# Patient Record
Sex: Male | Born: 1950
Health system: Southern US, Community
[De-identification: ages and names within clinical notes are randomized; demographics above are authoritative.]

## PROBLEM LIST (undated history)

## (undated) DIAGNOSIS — N189 Chronic kidney disease, unspecified: Secondary | ICD-10-CM

## (undated) DIAGNOSIS — I5022 Chronic systolic (congestive) heart failure: Secondary | ICD-10-CM

## (undated) DIAGNOSIS — I4819 Other persistent atrial fibrillation: Secondary | ICD-10-CM

## (undated) DIAGNOSIS — I420 Dilated cardiomyopathy: Secondary | ICD-10-CM

## (undated) DIAGNOSIS — I499 Cardiac arrhythmia, unspecified: Secondary | ICD-10-CM

## (undated) HISTORY — DX: Dilated cardiomyopathy: I42.0

## (undated) HISTORY — DX: Chronic systolic (congestive) heart failure: I50.22

## (undated) HISTORY — DX: Other persistent atrial fibrillation: I48.19

---

## 2014-03-22 ENCOUNTER — Emergency Department (HOSPITAL_COMMUNITY): Payer: Self-pay

## 2014-03-22 ENCOUNTER — Encounter (HOSPITAL_COMMUNITY): Payer: Self-pay | Admitting: Anesthesiology

## 2014-03-22 ENCOUNTER — Emergency Department (HOSPITAL_COMMUNITY): Payer: Self-pay | Admitting: Anesthesiology

## 2014-03-22 ENCOUNTER — Encounter (HOSPITAL_COMMUNITY)
Admission: EM | Discharge status: Against Medical Advice | Payer: Self-pay | Source: Home / Self Care | Attending: Family Medicine

## 2014-03-22 ENCOUNTER — Encounter (HOSPITAL_COMMUNITY): Payer: Self-pay | Admitting: Emergency Medicine

## 2014-03-22 ENCOUNTER — Inpatient Hospital Stay (HOSPITAL_COMMUNITY)
Admission: EM | Admit: 2014-03-22 | Discharge: 2014-03-24 | DRG: 854 | Discharge status: Against Medical Advice | Payer: Self-pay | Attending: Family Medicine | Admitting: Family Medicine

## 2014-03-22 DIAGNOSIS — I4891 Unspecified atrial fibrillation: Secondary | ICD-10-CM

## 2014-03-22 DIAGNOSIS — A419 Sepsis, unspecified organism: Principal | ICD-10-CM | POA: Diagnosis present

## 2014-03-22 DIAGNOSIS — R6520 Severe sepsis without septic shock: Secondary | ICD-10-CM

## 2014-03-22 DIAGNOSIS — F411 Generalized anxiety disorder: Secondary | ICD-10-CM | POA: Diagnosis present

## 2014-03-22 DIAGNOSIS — I10 Essential (primary) hypertension: Secondary | ICD-10-CM | POA: Diagnosis present

## 2014-03-22 DIAGNOSIS — L0291 Cutaneous abscess, unspecified: Secondary | ICD-10-CM

## 2014-03-22 DIAGNOSIS — L0231 Cutaneous abscess of buttock: Secondary | ICD-10-CM | POA: Diagnosis present

## 2014-03-22 DIAGNOSIS — A4902 Methicillin resistant Staphylococcus aureus infection, unspecified site: Secondary | ICD-10-CM | POA: Diagnosis present

## 2014-03-22 DIAGNOSIS — N289 Disorder of kidney and ureter, unspecified: Secondary | ICD-10-CM

## 2014-03-22 DIAGNOSIS — L03317 Cellulitis of buttock: Secondary | ICD-10-CM

## 2014-03-22 DIAGNOSIS — N179 Acute kidney failure, unspecified: Secondary | ICD-10-CM | POA: Diagnosis present

## 2014-03-22 DIAGNOSIS — I059 Rheumatic mitral valve disease, unspecified: Secondary | ICD-10-CM | POA: Diagnosis present

## 2014-03-22 HISTORY — PX: INCISION AND DRAINAGE ABSCESS: SHX5864

## 2014-03-22 LAB — CBC WITH DIFFERENTIAL/PLATELET
BASOS ABS: 0 10*3/uL (ref 0.0–0.1)
Basophils Relative: 0 % (ref 0–1)
Eosinophils Absolute: 0 10*3/uL (ref 0.0–0.7)
Eosinophils Relative: 0 % (ref 0–5)
HEMATOCRIT: 48.3 % (ref 39.0–52.0)
Hemoglobin: 16.2 g/dL (ref 13.0–17.0)
LYMPHS ABS: 1.3 10*3/uL (ref 0.7–4.0)
Lymphocytes Relative: 5 % — ABNORMAL LOW (ref 12–46)
MCH: 27.9 pg (ref 26.0–34.0)
MCHC: 33.5 g/dL (ref 30.0–36.0)
MCV: 83.1 fL (ref 78.0–100.0)
Monocytes Absolute: 1.3 10*3/uL — ABNORMAL HIGH (ref 0.1–1.0)
Monocytes Relative: 5 % (ref 3–12)
NEUTROS ABS: 23.3 10*3/uL — AB (ref 1.7–7.7)
Neutrophils Relative %: 90 % — ABNORMAL HIGH (ref 43–77)
Platelets: 362 10*3/uL (ref 150–400)
RBC: 5.81 MIL/uL (ref 4.22–5.81)
RDW: 14.9 % (ref 11.5–15.5)
WBC: 25.9 10*3/uL — ABNORMAL HIGH (ref 4.0–10.5)

## 2014-03-22 LAB — BASIC METABOLIC PANEL
BUN: 25 mg/dL — AB (ref 6–23)
CHLORIDE: 96 meq/L (ref 96–112)
CO2: 24 mEq/L (ref 19–32)
CREATININE: 2.13 mg/dL — AB (ref 0.50–1.35)
Calcium: 8.8 mg/dL (ref 8.4–10.5)
GFR calc Af Amer: 37 mL/min — ABNORMAL LOW (ref >90)
GFR calc non Af Amer: 32 mL/min — ABNORMAL LOW (ref >90)
Glucose, Bld: 152 mg/dL — ABNORMAL HIGH (ref 70–99)
Potassium: 4.2 mEq/L (ref 3.7–5.3)
Sodium: 135 mEq/L — ABNORMAL LOW (ref 137–147)

## 2014-03-22 LAB — TSH: TSH: 3.56 u[IU]/mL (ref 0.350–4.500)

## 2014-03-22 LAB — MRSA PCR SCREENING: MRSA by PCR: NEGATIVE

## 2014-03-22 SURGERY — INCISION AND DRAINAGE, ABSCESS
Anesthesia: General | Laterality: Left

## 2014-03-22 MED ORDER — DILTIAZEM HCL 100 MG IV SOLR
5.0000 mg/h | INTRAVENOUS | Status: DC
  Administered 2014-03-22: 15 mg/h via INTRAVENOUS
  Administered 2014-03-23: 10 mg/h via INTRAVENOUS
  Filled 2014-03-22 (×3): qty 100

## 2014-03-22 MED ORDER — MIDAZOLAM HCL 2 MG/2ML IJ SOLN
INTRAMUSCULAR | Status: AC
  Filled 2014-03-22: qty 2

## 2014-03-22 MED ORDER — HEPARIN SODIUM (PORCINE) 5000 UNIT/ML IJ SOLN
5000.0000 [IU] | Freq: Three times a day (TID) | INTRAMUSCULAR | Status: DC
  Administered 2014-03-22 – 2014-03-24 (×5): 5000 [IU] via SUBCUTANEOUS
  Filled 2014-03-22 (×8): qty 1

## 2014-03-22 MED ORDER — OXYCODONE HCL 5 MG PO TABS: 5.0000 mg | ORAL_TABLET | Freq: Once | ORAL | Status: DC | PRN

## 2014-03-22 MED ORDER — ACETAMINOPHEN 500 MG PO TABS
1000.0000 mg | ORAL_TABLET | Freq: Once | ORAL | Status: AC
Start: 2014-03-22 — End: 2014-03-22
  Administered 2014-03-22: 1000 mg via ORAL
  Filled 2014-03-22: qty 2

## 2014-03-22 MED ORDER — ACETAMINOPHEN 325 MG PO TABS
650.0000 mg | ORAL_TABLET | Freq: Four times a day (QID) | ORAL | Status: DC | PRN
  Administered 2014-03-23: 650 mg via ORAL
  Filled 2014-03-22: qty 2

## 2014-03-22 MED ORDER — HYDROMORPHONE HCL PF 1 MG/ML IJ SOLN
1.0000 mg | INTRAMUSCULAR | Status: DC | PRN
  Administered 2014-03-23: 1 mg via INTRAVENOUS
  Filled 2014-03-22: qty 1

## 2014-03-22 MED ORDER — MORPHINE SULFATE 4 MG/ML IJ SOLN
4.0000 mg | Freq: Once | INTRAMUSCULAR | Status: AC
  Administered 2014-03-22: 4 mg via INTRAVENOUS
  Filled 2014-03-22: qty 1

## 2014-03-22 MED ORDER — SODIUM CHLORIDE 0.9 % IV BOLUS (SEPSIS)
1000.0000 mL | Freq: Once | INTRAVENOUS | Status: AC
  Administered 2014-03-22: 1000 mL via INTRAVENOUS

## 2014-03-22 MED ORDER — HYDROMORPHONE HCL PF 1 MG/ML IJ SOLN: 0.2500 mg | INTRAMUSCULAR | Status: DC | PRN

## 2014-03-22 MED ORDER — FENTANYL CITRATE 0.05 MG/ML IJ SOLN
INTRAMUSCULAR | Status: DC | PRN
  Administered 2014-03-22: 150 ug via INTRAVENOUS
  Administered 2014-03-22: 100 ug via INTRAVENOUS

## 2014-03-22 MED ORDER — LIDOCAINE HCL (CARDIAC) 20 MG/ML IV SOLN
INTRAVENOUS | Status: AC
  Filled 2014-03-22: qty 5

## 2014-03-22 MED ORDER — ONDANSETRON HCL 4 MG/2ML IJ SOLN: 4.0000 mg | Freq: Four times a day (QID) | INTRAMUSCULAR | Status: DC | PRN

## 2014-03-22 MED ORDER — FENTANYL CITRATE 0.05 MG/ML IJ SOLN
INTRAMUSCULAR | Status: AC
  Filled 2014-03-22: qty 5

## 2014-03-22 MED ORDER — PROPOFOL 10 MG/ML IV BOLUS
INTRAVENOUS | Status: DC | PRN
  Administered 2014-03-22: 200 mg via INTRAVENOUS

## 2014-03-22 MED ORDER — ACETAMINOPHEN 650 MG RE SUPP: 650.0000 mg | Freq: Four times a day (QID) | RECTAL | Status: DC | PRN

## 2014-03-22 MED ORDER — LACTATED RINGERS IV SOLN
INTRAVENOUS | Status: DC | PRN
  Administered 2014-03-22 (×2): via INTRAVENOUS

## 2014-03-22 MED ORDER — DILTIAZEM HCL 100 MG IV SOLR
5.0000 mg/h | INTRAVENOUS | Status: DC
  Administered 2014-03-22: 10 mg/h via INTRAVENOUS
  Filled 2014-03-22: qty 100

## 2014-03-22 MED ORDER — CLINDAMYCIN PHOSPHATE 600 MG/50ML IV SOLN
600.0000 mg | Freq: Once | INTRAVENOUS | Status: AC
  Administered 2014-03-22: 600 mg via INTRAVENOUS
  Filled 2014-03-22: qty 50

## 2014-03-22 MED ORDER — PROPOFOL 10 MG/ML IV BOLUS
INTRAVENOUS | Status: AC
  Filled 2014-03-22: qty 20

## 2014-03-22 MED ORDER — SUCCINYLCHOLINE CHLORIDE 20 MG/ML IJ SOLN
INTRAMUSCULAR | Status: AC
  Filled 2014-03-22: qty 1

## 2014-03-22 MED ORDER — CLINDAMYCIN PHOSPHATE 600 MG/50ML IV SOLN
600.0000 mg | Freq: Three times a day (TID) | INTRAVENOUS | Status: DC
  Administered 2014-03-22 – 2014-03-24 (×5): 600 mg via INTRAVENOUS
  Filled 2014-03-22 (×7): qty 50

## 2014-03-22 MED ORDER — DILTIAZEM LOAD VIA INFUSION
10.0000 mg | Freq: Once | INTRAVENOUS | Status: AC
  Administered 2014-03-22: 10 mg via INTRAVENOUS
  Filled 2014-03-22: qty 10

## 2014-03-22 MED ORDER — HEPARIN SODIUM (PORCINE) 5000 UNIT/ML IJ SOLN
5000.0000 [IU] | Freq: Three times a day (TID) | INTRAMUSCULAR | Status: DC
  Filled 2014-03-22: qty 1

## 2014-03-22 MED ORDER — OXYCODONE HCL 5 MG/5ML PO SOLN
5.0000 mg | Freq: Once | ORAL | Status: DC | PRN
Start: 2014-03-22 — End: 2014-03-22

## 2014-03-22 MED ORDER — ONDANSETRON HCL 4 MG/2ML IJ SOLN: 4.0000 mg | Freq: Once | INTRAMUSCULAR | Status: DC | PRN

## 2014-03-22 MED ORDER — DOCUSATE SODIUM 100 MG PO CAPS
100.0000 mg | ORAL_CAPSULE | Freq: Two times a day (BID) | ORAL | Status: DC
  Administered 2014-03-23 (×2): 100 mg via ORAL
  Filled 2014-03-22 (×4): qty 1

## 2014-03-22 MED ORDER — LIDOCAINE HCL (CARDIAC) 20 MG/ML IV SOLN
INTRAVENOUS | Status: DC | PRN
  Administered 2014-03-22: 100 mg via INTRAVENOUS

## 2014-03-22 MED ORDER — ONDANSETRON HCL 4 MG PO TABS: 4.0000 mg | ORAL_TABLET | Freq: Four times a day (QID) | ORAL | Status: DC | PRN

## 2014-03-22 MED ORDER — SUCCINYLCHOLINE CHLORIDE 20 MG/ML IJ SOLN
INTRAMUSCULAR | Status: DC | PRN
Start: 2014-03-22 — End: 2014-03-22
  Administered 2014-03-22: 140 mg via INTRAVENOUS

## 2014-03-22 MED ORDER — MIDAZOLAM HCL 2 MG/2ML IJ SOLN
INTRAMUSCULAR | Status: DC | PRN
  Administered 2014-03-22: 2 mg via INTRAVENOUS

## 2014-03-22 MED ORDER — SODIUM CHLORIDE 0.9 % IJ SOLN
3.0000 mL | Freq: Two times a day (BID) | INTRAMUSCULAR | Status: DC
  Administered 2014-03-22 – 2014-03-23 (×2): 3 mL via INTRAVENOUS

## 2014-03-22 SURGICAL SUPPLY — 27 items
BANDAGE GAUZE ELAST BULKY 4 IN (GAUZE/BANDAGES/DRESSINGS) ×2 IMPLANT
CANISTER SUCTION 2500CC (MISCELLANEOUS) ×2 IMPLANT
COVER SURGICAL LIGHT HANDLE (MISCELLANEOUS) ×2 IMPLANT
DRAPE LAPAROSCOPIC ABDOMINAL (DRAPES) ×2 IMPLANT
DRAPE UTILITY 15X26 W/TAPE STR (DRAPE) ×4 IMPLANT
DRSG PAD ABDOMINAL 8X10 ST (GAUZE/BANDAGES/DRESSINGS) ×2 IMPLANT
ELECT CAUTERY BLADE 6.4 (BLADE) ×2 IMPLANT
ELECT REM PT RETURN 9FT ADLT (ELECTROSURGICAL) ×2
ELECTRODE REM PT RTRN 9FT ADLT (ELECTROSURGICAL) ×1 IMPLANT
GLOVE BIO SURGEON STRL SZ8 (GLOVE) ×2 IMPLANT
GLOVE BIOGEL PI IND STRL 8 (GLOVE) ×1 IMPLANT
GLOVE BIOGEL PI INDICATOR 8 (GLOVE) ×1
GOWN STRL REUS W/ TWL LRG LVL3 (GOWN DISPOSABLE) ×1 IMPLANT
GOWN STRL REUS W/ TWL XL LVL3 (GOWN DISPOSABLE) ×1 IMPLANT
GOWN STRL REUS W/TWL LRG LVL3 (GOWN DISPOSABLE) ×1
GOWN STRL REUS W/TWL XL LVL3 (GOWN DISPOSABLE) ×1
KIT BASIN OR (CUSTOM PROCEDURE TRAY) ×2 IMPLANT
KIT ROOM TURNOVER OR (KITS) ×2 IMPLANT
NS IRRIG 1000ML POUR BTL (IV SOLUTION) ×2 IMPLANT
PACK GENERAL/GYN (CUSTOM PROCEDURE TRAY) ×2 IMPLANT
PAD ABD 8X10 STRL (GAUZE/BANDAGES/DRESSINGS) ×2 IMPLANT
PAD ARMBOARD 7.5X6 YLW CONV (MISCELLANEOUS) ×2 IMPLANT
SPONGE GAUZE 4X4 12PLY (GAUZE/BANDAGES/DRESSINGS) ×2 IMPLANT
SWAB COLLECTION DEVICE MRSA (MISCELLANEOUS) IMPLANT
TOWEL OR 17X24 6PK STRL BLUE (TOWEL DISPOSABLE) ×2 IMPLANT
TOWEL OR 17X26 10 PK STRL BLUE (TOWEL DISPOSABLE) ×2 IMPLANT
TUBE ANAEROBIC SPECIMEN COL (MISCELLANEOUS) IMPLANT

## 2014-03-22 NOTE — Anesthesia Postprocedure Evaluation (Signed)
  Anesthesia Post-op Note  Patient: Jeffery Christian  Procedure(s) Performed: Procedure(s): INCISION AND DRAINAGE ABSCESS LEFT BUTTOCK ABSCESS (Left)  Patient Location: PACU  Anesthesia Type:General  Level of Consciousness: awake, alert  and oriented  Airway and Oxygen Therapy: Patient Spontanous Breathing and Patient connected to face mask oxygen  Post-op Pain: mild  Post-op Assessment: Post-op Vital signs reviewed  Post-op Vital Signs: Reviewed  Last Vitals:  Filed Vitals:   03/22/14 1818  BP: 108/66  Pulse: 127  Temp: 37.9 C  Resp: 27    Complications: No apparent anesthesia complications

## 2014-03-22 NOTE — ED Notes (Signed)
Pt presents to department for evaluation of abscess to buttock region. Ongoing x1 week. Pt states red/yellow colored drainage and severe pain. 9/10 pain at the time. Pt is alert and oriented x4. NAD.

## 2014-03-22 NOTE — Anesthesia Preprocedure Evaluation (Addendum)
Anesthesia Evaluation  Patient identified by MRN, date of birth, ID band Patient awake    Reviewed: Allergy & Precautions, H&P , NPO status , Patient's Chart, lab work & pertinent test results  Airway Mallampati: I TM Distance: >3 FB Neck ROM: Full    Dental  (+) Teeth Intact, Dental Advisory Given   Pulmonary  breath sounds clear to auscultation        Cardiovascular + dysrhythmias (A Fib with rapid ventricular response) Atrial Fibrillation Rhythm:Regular Rate:Normal     Neuro/Psych    GI/Hepatic   Endo/Other    Renal/GU      Musculoskeletal   Abdominal   Peds  Hematology   Anesthesia Other Findings   Reproductive/Obstetrics                           Anesthesia Physical Anesthesia Plan  ASA: III and emergent  Anesthesia Plan: General   Post-op Pain Management:    Induction: Intravenous  Airway Management Planned: Oral ETT  Additional Equipment:   Intra-op Plan:   Post-operative Plan: Extubation in OR  Informed Consent: I have reviewed the patients History and Physical, chart, labs and discussed the procedure including the risks, benefits and alternatives for the proposed anesthesia with the patient or authorized representative who has indicated his/her understanding and acceptance.   Dental advisory given  Plan Discussed with: CRNA, Anesthesiologist and Surgeon  Anesthesia Plan Comments:         Anesthesia Quick Evaluation

## 2014-03-22 NOTE — ED Provider Notes (Signed)
This chart was scribed for Marlon Pel, PA-C, working with Merrie Roof, MD, by Ardelia Mems ED Scribe. This patient was seen in room TR11C/TR11C and the patient's care was started at 1:00 PM.  MSE HPI Comments: Jeffery Christian is a 63 y.o. male who presents to the Emergency Department complaining of an area of gradually worsening swelling to his buttocks for the past week. Small abscess to the area that started 1 month ago. He reports associated gradually worsening pain and redness as well. He has also noticed red and yellow drainage from the area.   PE; The infection does not involve the testicles, perineum or rectum. The abscess measures 9.5 cm x 4 cm, with a surrounding cellulitis field of 27 cm x 13 cm  Cbc, bmp, CT pelvis w IV contrast,  Saline, Clindamycin and Morphine ordered.  Patient moved to the acute side,  Will most likely need a surgeon for I&D.     I personally performed the services described in this documentation, which was scribed in my presence. The recorded information has been reviewed and is accurate.   Dorthula Matas, PA-C 03/22/14 1312  Dorthula Matas, PA-C 03/22/14 1313

## 2014-03-22 NOTE — ED Notes (Signed)
Dr. Loretha Stapler aware of pts HR and pain

## 2014-03-22 NOTE — Op Note (Signed)
03/22/2014  5:53 PM  PATIENT:  Jeffery Christian  63 y.o. male  PRE-OPERATIVE DIAGNOSIS:  left buttock abscess  POST-OPERATIVE DIAGNOSIS:  left buttock abscess With necrotizing soft tissue infection  PROCEDURE:  Procedure(s): INCISION AND DRAINAGE ABSCESS LEFT BUTTOCK ABSCESS, DEBRIDEMENT OF NECROTIZING SOFT TISSUE INFECTION  SURGEON:  Surgeon(s): [name], MD  ASSISTANTS: none   ANESTHESIA:   general  EBL:     BLOOD ADMINISTERED:none  DRAINS: none   SPECIMEN:  Excision  DISPOSITION OF SPECIMEN:  PATHOLOGY  COUNTS:  YES  DICTATION: .Dragon Dictation 1.  Progress note or procedure note with a detailed description of the procedure.  2.  Tool used for debridement (curette, scapel, etc.)  Cautery  3.  Frequency of surgical debridement.   First time  4.  Measurement of total devitalized tissue (wound surface) before and after surgical debridement.  6 x 4 cm preoperatively, 8.5 x 6 cm, 2.5 cm deep postoperatively  5.  Area and depth of devitalized tissue removed from wound.  As above  6.  Blood loss and description of tissue removed.  25 cc blood loss, necrotic purulent tissue debrided  7.  Evidence of the progress of the wound's response to treatment.  Will follow and update  8.  Was there any viable tissue removed (measurements): Minimal rim of viable tissue also debrided  Patient presented with three-day history of increasing pain and drainage from his left buttock. He hss a large buttock abscess with necrotic tissue and is brought emergently to the operating room for incision, drainage, and debridement. He received intravenous antibiotics in the emergency room. Informed consent was obtained. He was brought to the operating room and general endotracheal anesthesia was administered by the anesthesia staff. His placed in prone position. Left buttock and right buttock areas were prepped and draped in sterile fashion.We did time out procedure. Cautery was used to excise  Visible necrotic tissue on his medial left buttock. This area was over 8 cm in size. Large pockets of pus were entered in the subcutaneous fat. This was cultured. Further debridement continued until there is no devitalized tissue and no further pus pockets. Wound was copiously irrigated hemostasis was obtained. Wound was packed with saline-soaked Kerlix and covered with sterile dressing. All counts were correct. Patient tolerated procedure well without apparent complication was taken recovery in guarded condition. Of note, atrial fibrillation with rapid ventricular response present preoperatively had improved rate control at the end of the procedure. PATIENT DISPOSITION:  PACU - guarded condition.   Delay start of Pharmacological VTE agent (>24hrs) due to surgical blood loss or risk of bleeding:  no  Violeta Gelinas, MD, MPH, FACS Pager: [phone]  6/21/20155:53 PM

## 2014-03-22 NOTE — Consult Note (Signed)
Reason for Consult:L buttock abscess Referring Physician: Eulis Foster, PAC  Jeffery Christian is an 63 y.o. male.  HPI: Patient presented to the emergency department with a three-day history of left medial buttock pain. He initially developed a small pustule there. He squeezed some pus out but it continued to enlarge. He treated again himself a day or so ago. His pain progressed. He sat in his hot tub today which did not make her feel better. It began to drain bloody purulent fluid and he came to the emergency department. He has had abscesses in the same location but not as severe in the past. During his emergency department evaluation he was noted to be in a fib with rapid ventricular response. He was also noted to have acute kidney injury. CT without contrast was obtained demonstrating a left buttock abscess.  History reviewed. No pertinent past medical history.  History reviewed. No pertinent past surgical history.  No family history on file.  Social History:  reports that he has never smoked. He does not have any smokeless tobacco history on file. He reports that he does not drink alcohol or use illicit drugs.  Allergies: No Known Allergies  Medications: Scheduled: Continuous: PRN:    Results for orders placed during the hospital encounter of 03/22/14 (from the past 48 hour(s))  CBC WITH DIFFERENTIAL     Status: Abnormal   Collection Time    03/22/14 12:47 PM      Result Value Ref Range   WBC 25.9 (*) 4.0 - 10.5 K/uL   Comment: WHITE COUNT CONFIRMED ON SMEAR   RBC 5.81  4.22 - 5.81 MIL/uL   Hemoglobin 16.2  13.0 - 17.0 g/dL   HCT 48.3  39.0 - 52.0 %   MCV 83.1  78.0 - 100.0 fL   MCH 27.9  26.0 - 34.0 pg   MCHC 33.5  30.0 - 36.0 g/dL   RDW 14.9  11.5 - 15.5 %   Platelets 362  150 - 400 K/uL   Neutrophils Relative % 90 (*) 43 - 77 %   Lymphocytes Relative 5 (*) 12 - 46 %   Monocytes Relative 5  3 - 12 %   Eosinophils Relative 0  0 - 5 %   Basophils Relative 0  0 - 1 %   Neutro Abs  23.3 (*) 1.7 - 7.7 K/uL   Lymphs Abs 1.3  0.7 - 4.0 K/uL   Monocytes Absolute 1.3 (*) 0.1 - 1.0 K/uL   Eosinophils Absolute 0.0  0.0 - 0.7 K/uL   Basophils Absolute 0.0  0.0 - 0.1 K/uL   WBC Morphology TOXIC GRANULATION     Comment: INCREASED BANDS (>20% BANDS)  BASIC METABOLIC PANEL     Status: Abnormal   Collection Time    03/22/14 12:47 PM      Result Value Ref Range   Sodium 135 (*) 137 - 147 mEq/L   Potassium 4.2  3.7 - 5.3 mEq/L   Chloride 96  96 - 112 mEq/L   CO2 24  19 - 32 mEq/L   Glucose, Bld 152 (*) 70 - 99 mg/dL   BUN 25 (*) 6 - 23 mg/dL   Creatinine, Ser 2.13 (*) 0.50 - 1.35 mg/dL   Calcium 8.8  8.4 - 10.5 mg/dL   GFR calc non Af Amer 32 (*) >90 mL/min   GFR calc Af Amer 37 (*) >90 mL/min   Comment: (NOTE)     The eGFR has been calculated using the CKD  EPI equation.     This calculation has not been validated in all clinical situations.     eGFR's persistently <90 mL/min signify possible Chronic Kidney     Disease.    Ct Pelvis Wo Contrast  03/22/2014   CLINICAL DATA:  abscess to buttocks ulceration, ? deep fascial infectio- cannot have IV due to elevated Creat  Go  EXAM: CT PELVIS WITHOUT CONTRAST  TECHNIQUE: Multidetector CT imaging of the pelvis was performed following the standard protocol without intravenous contrast.  COMPARISON:  None.  FINDINGS: Adjacent to the gluteal fall on the left is a 6.8 x 3.4 cm low attenuating focus in the subcutaneous fat. The borders of this finding are irregular with Hounsfield units of 25. There is thickening of the overlying skin and inflammatory change extending into the surrounding gluteal fat. This area extends to the distal portion of the coccyx. Differential considerations a phlegmon versus an early or complex viscus abscess. Osteomyelitis of the distal coccyx cannot be excluded. This finding is not having well defined appearance which be consistent with a drainable abscess.  The gluteal musculature is symmetric. There is no  evidence of infiltration or edema. The remaining pelvic musculature and subcutaneous fat is homogeneous and symmetric without infiltration. No further evidence of free fluid or loculated fluid collections.  The intrapelvic contents are unremarkable. The bowel is negative. A very small amount of inflammatory change in the mid to lower presacral region. The visualized non-opacified vascular structures unremarkable. No lower abdominal wall or inguinal hernia. The prostate is prominent and there is indentation along the base of the bladder from the prostate.  There is no evidence of acute fracture or dislocation within the osseous structures.  IMPRESSION: Focal complex appearing collection adjacent to the gluteal fold region on the left. This area extends to the tip of the coccyx. Differential considerations are a phlegmon. Osteomyelitis within the distal coccyx cannot be excluded. Surgical consultation recommended. These results were called by telephone at the time of interpretation on 03/22/2014 at 3:55 PM to the charge nurse of the emergency department, [place] GROCE, who verbally acknowledged these results.  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   Electronically Signed   By: Salome Holmes M.D.   On: 03/22/2014 15:58    Review of Systems  Constitutional: Negative for fever and chills.  HENT: Negative.   Eyes: Negative.   Respiratory: Negative.   Cardiovascular: Negative for chest pain.       No sensation of palpitations or irregular heartbeat  Gastrointestinal:       Decreased appetite for several days, see history of present illness  Genitourinary: Negative.   Musculoskeletal: Negative.   Skin:       See history of present illness  Neurological: Negative.   Endo/Heme/Allergies: Negative.   Psychiatric/Behavioral: Negative.    Blood pressure 112/55, pulse 137, temperature 101.8 F (38.8 C), temperature source Oral, resp. rate 21, SpO2 96.00%. Physical Exam  Constitutional:  He is oriented to person, place, and time. He appears well-developed and well-nourished.  Mild distress  HENT:  Head: Normocephalic.  Mouth/Throat: Oropharynx is clear and moist. No oropharyngeal exudate.  Eyes: EOM are normal. Pupils are equal, round, and reactive to light. No scleral icterus.  Neck: Normal range of motion. Neck supple. No tracheal deviation present.  Cardiovascular:  Irregularly irregular, heart rate 150  Respiratory: Effort normal and breath sounds normal. No stridor. No respiratory distress. He has no wheezes.  GI: Soft. He exhibits no  distension. There is no tenderness. There is no rebound and no guarding.  Musculoskeletal:       Legs: 6 cm x 2 cm area along the medial left buttock with necrotic skin and purulent drainage, large surrounding cellulitis involving the left buttock, tender  Neurological: He is alert and oriented to person, place, and time. He exhibits normal muscle tone. Coordination normal.  Psychiatric: He has a normal mood and affect.    Assessment/Plan: Large left buttock abscess - will proceed to the OR for incision, drainage, and debridement. Procedure, risks, benefits were discussed in detail with the patient. He agrees. Continue IV antibiotics  Atrial fibrillation with rapid ventricular response, acute kidney injury - will be managed by the family practice service who are admitting.  Atlantis Delong E 03/22/2014, 4:26 PM

## 2014-03-22 NOTE — H&P (Signed)
[facility] Admission History and Physical Service Pager: [phone]  Patient name: Jeffery Christian Medical record number: [id number] Date of birth: August 15, 1951 Age: 63 y.o. Gender: male  Primary Care Provider: No PCP Per Patient Consultants: Surgery Code Status: Full   Chief Complaint: Left Buttock abscess   Assessment and Plan: Jeffery Christian is a 63 y.o. male presenting with left buttock abscess.  CT pelvis concerning for 6.8 x 3.4 cm abscess of the left buttock. PMH is significant for gout. In the to step down unit for close monitoring of sepsis and A. fib with RVR. Dr. Jennette Kettle attending.  Left buttock abscess: White count of 25.9 on admission. CT concerning for 6.8 x 3.4 cm abscess. She received one dose of clindamycin in the ED. Patient is febrile, tachycardic, with a white count and source, meeting sepsis criteria. He is to be admitted to step down unit for close monitoring after surgery. - Surgical consult: Thank you for your recommendations. Patient is to go to surgery today. Wound cultures will be obtained. Antibiotics per recommendations of surgical team. - Blood cultures ordered. - Repeat CBC in the a.m., A1c - Close monitoring of vital signs due to sepsis.   A. fib with RVR: Likely transient d/t sepsis, however uncertain if he has arrhythmia at baseline. Patient does not follow regularly with a physician. - Will treat fever and infection aggressively.  - IV fluids administered. - TSH - Step down unit, telemetry for close monitoring of patient. - Consult cardiology  AKI: Uncertain of pts baseline. Reports no kidney disease. Likey injury from decreased PO intake. No contrast was given for CT.  - Continue to monitor - IVF - avoid nephrotoxic agents   FEN/GI: N.p.o., maintenance IV fluid Prophylaxis: Heparin subcutaneous after surgery.  Disposition: Pending clinical improvement and surgical recommendations. Likely stay at least 2 evenings.  History of  Present Illness: Jeffery Christian is a 63 y.o. male presenting with left buttock abscess. Patient states that he noticed last Monday a pimple/boil on his buttock. At that time he states squeezed it until puslike fluid came out. 2 days later he noticed enlargement to boil and he again squeezed it. Today he states he attempted to get in his hot tub and the boil " exploded ". Patient endorses chills and fever over the last 3 days. Denies nausea, vomit or diarrhea. He reports she's been able to eat or drink okay the last few days. His appetite has been mildly affected. His last by mouth intake was this morning for yogurt and if you are see which, he is drinking fluids while in the emergency room. Patient states he has no past medical history except for gout, which he is not medicated.   Patient states he does not use alcohol or tobacco. He denies illicit drugs. His younger daughter is with him today.  Review Of Systems: Per HPI  Otherwise 12 point review of systems was performed and was unremarkable.  There are no active problems to display for this patient.  Past Medical History: History reviewed. No pertinent past medical history. Past Surgical History: History reviewed. No pertinent past surgical history. Social History: History  Substance Use Topics  . Smoking status: Never Smoker   . Smokeless tobacco: Not on file  . Alcohol Use: No   Additional social history: See above Please also refer to relevant sections of EMR.  Family History: No family history on file. Allergies and Medications: No Known Allergies No current facility-administered medications on file prior to  encounter.   No current outpatient prescriptions on file prior to encounter.    Objective: BP 112/55  Pulse 137  Temp(Src) 101.8 F (38.8 C) (Oral)  Resp 21  SpO2 96% Exam: General: Mildly distressed, in pain obese Caucasian male. Nontoxic in appearance. HEENT: Atraumatic, normocephalic. Bilateral eyes without  injections or icterus. Moist mucous membranes. Cardiovascular: Tachycardic. Irregular irregular. No murmurs, clicks, gallops or rubs appreciated. Respiratory: Clear to auscultation bilaterally. No wheezing, rhonchi or rales. Normal work of breath. Abdomen: Soft. Obese. Nontender nondistended. Bowel sounds present. No masses palpated. Extremities: Warm, well perfused. +2/4 DP/PT. No rashes. No erythema or edema. Calves nontender. Skin: Left medial buttock with large 6 cm x 2 cm area of  Necrotic, skin purulent and bloody drainage surrounded by cellulitis involving almost the entire left buttock. Area is extremely tender. Neuro: Alert. Oriented x3. Perla. EOMI. Cranial nerves II through XII intact.  Labs and Imaging: CBC BMET   Recent Labs Lab 03/22/14 1247  WBC 25.9*  HGB 16.2  HCT 48.3  PLT 362    Recent Labs Lab 03/22/14 1247  NA 135*  K 4.2  CL 96  CO2 24  BUN 25*  CREATININE 2.13*  GLUCOSE 152*  CALCIUM 8.8     Ct Pelvis Wo Contrast  03/22/2014   CLINICAL DATA:  abscess to buttocks ulceration, ? deep fascial infectio- cannot have IV due to elevated Creat  Go  EXAM: CT PELVIS WITHOUT CONTRAST  TECHNIQUE: Multidetector CT imaging of the pelvis was performed following the standard protocol without intravenous contrast.  COMPARISON:  None.  FINDINGS: Adjacent to the gluteal fall on the left is a 6.8 x 3.4 cm low attenuating focus in the subcutaneous fat. The borders of this finding are irregular with Hounsfield units of 25. There is thickening of the overlying skin and inflammatory change extending into the surrounding gluteal fat. This area extends to the distal portion of the coccyx. Differential considerations a phlegmon versus an early or complex viscus abscess. Osteomyelitis of the distal coccyx cannot be excluded. This finding is not having well defined appearance which be consistent with a drainable abscess.  The gluteal musculature is symmetric. There is no evidence of  infiltration or edema. The remaining pelvic musculature and subcutaneous fat is homogeneous and symmetric without infiltration. No further evidence of free fluid or loculated fluid collections.  The intrapelvic contents are unremarkable. The bowel is negative. A very small amount of inflammatory change in the mid to lower presacral region. The visualized non-opacified vascular structures unremarkable. No lower abdominal wall or inguinal hernia. The prostate is prominent and there is indentation along the base of the bladder from the prostate.  There is no evidence of acute fracture or dislocation within the osseous structures.  IMPRESSION: Focal complex appearing collection adjacent to the gluteal fold region on the left. This area extends to the tip of the coccyx. Differential considerations are a phlegmon. Osteomyelitis within the distal coccyx cannot be excluded. Surgical consultation recommended. These results were called by telephone at the time of interpretation on 03/22/2014 at 3:55 PM to the charge nurse of the emergency department, [place] GROCE, who verbally acknowledged these results.  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   Electronically Signed   By: Salome HolmesHector  Cooper M.D.   On: 03/22/2014 15:58     Natalia Leatherwoodenee A Kuneff, DO 03/22/2014, 4:15 PM PGY-2, [facility] FPTS Intern pager: [phone], text pages welcome

## 2014-03-22 NOTE — ED Provider Notes (Signed)
CSN: [id number]     Arrival date & time 03/22/14  1153 History   First MD Initiated Contact with Patient 03/22/14 1237     Chief Complaint  Patient presents with  . Abscess     (Consider location/radiation/quality/duration/timing/severity/associated sxs/prior Treatment) Patient is a 63 y.o. male presenting with abscess.  Abscess Abscess location: buttock. Abscess quality: draining, fluctuance, redness, warmth and weeping   Red streaking: yes   Duration:  1 week Progression:  Worsening Chronicity:  New Context: not diabetes and not immunosuppression   Context comment:  Noticed a small bump a week ago which he squeezed while in the shower, expressing some fluid at that time.  Then has soaked in the hot tub afew times.   Relieved by:  Nothing Worsened by:  Draining/squeezing Associated symptoms: fever     History reviewed. No pertinent past medical history. History reviewed. No pertinent past surgical history. No family history on file. History  Substance Use Topics  . Smoking status: Never Smoker   . Smokeless tobacco: Not on file  . Alcohol Use: No    Review of Systems  Constitutional: Positive for fever.  All other systems reviewed and are negative.     Allergies  Review of patient's allergies indicates no known allergies.  Home Medications   Prior to Admission medications   Not on File   BP 137/85  Pulse 100  Temp(Src) 100.6 F (38.1 C) (Oral)  Resp 22  SpO2 97% Physical Exam  Nursing note and vitals reviewed. Constitutional: He is oriented to person, place, and time. He appears well-developed and well-nourished. No distress.  HENT:  Head: Normocephalic and atraumatic.  Eyes: Conjunctivae are normal. No scleral icterus.  Neck: Neck supple.  Cardiovascular: Normal rate and intact distal pulses.   Pulmonary/Chest: Effort normal. No stridor. No respiratory distress.  Abdominal: Normal appearance. He exhibits no distension. There is no tenderness. There  is no rigidity, no rebound and no guarding.  Genitourinary:  Very large fluctuant mass within left buttock.  Some purulent drainage.  Surrounding erythema.   No crepitus.  No scrotal pain, swelling, masses, or crepitus.    Neurological: He is alert and oriented to person, place, and time.  Skin: Skin is warm and dry. No rash noted.  Psychiatric: He has a normal mood and affect. His behavior is normal.    ED Course  Procedures (including critical care time) Labs Review Labs Reviewed  CBC WITH DIFFERENTIAL - Abnormal; Notable for the following:    WBC 25.9 (*)    Neutrophils Relative % 90 (*)    Lymphocytes Relative 5 (*)    Neutro Abs 23.3 (*)    Monocytes Absolute 1.3 (*)    All other components within normal limits  BASIC METABOLIC PANEL - Abnormal; Notable for the following:    Sodium 135 (*)    Glucose, Bld 152 (*)    BUN 25 (*)    Creatinine, Ser 2.13 (*)    GFR calc non Af Amer 32 (*)    GFR calc Af Amer 37 (*)    All other components within normal limits    Imaging Review Ct Pelvis Wo Contrast  03/22/2014   CLINICAL DATA:  abscess to buttocks ulceration, ? deep fascial infectio- cannot have IV due to elevated Creat  Go  EXAM: CT PELVIS WITHOUT CONTRAST  TECHNIQUE: Multidetector CT imaging of the pelvis was performed following the standard protocol without intravenous contrast.  COMPARISON:  None.  FINDINGS: Adjacent to the gluteal  fall on the left is a 6.8 x 3.4 cm low attenuating focus in the subcutaneous fat. The borders of this finding are irregular with Hounsfield units of 25. There is thickening of the overlying skin and inflammatory change extending into the surrounding gluteal fat. This area extends to the distal portion of the coccyx. Differential considerations a phlegmon versus an early or complex viscus abscess. Osteomyelitis of the distal coccyx cannot be excluded. This finding is not having well defined appearance which be consistent with a drainable abscess.  The  gluteal musculature is symmetric. There is no evidence of infiltration or edema. The remaining pelvic musculature and subcutaneous fat is homogeneous and symmetric without infiltration. No further evidence of free fluid or loculated fluid collections.  The intrapelvic contents are unremarkable. The bowel is negative. A very small amount of inflammatory change in the mid to lower presacral region. The visualized non-opacified vascular structures unremarkable. No lower abdominal wall or inguinal hernia. The prostate is prominent and there is indentation along the base of the bladder from the prostate.  There is no evidence of acute fracture or dislocation within the osseous structures.  IMPRESSION: Focal complex appearing collection adjacent to the gluteal fold region on the left. This area extends to the tip of the coccyx. Differential considerations are a phlegmon. Osteomyelitis within the distal coccyx cannot be excluded. Surgical consultation recommended. These results were called by telephone at the time of interpretation on 03/22/2014 at 3:55 PM to the charge nurse of the emergency department, [place] GROCE, who verbally acknowledged these results.  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   Electronically Signed   By: Salome Holmes M.D.   On: 03/22/2014 15:58  All radiology studies independently viewed by me.      EKG Interpretation   Date/Time:  Sunday March 22 2014 15:10:08 EDT Ventricular Rate:  126 PR Interval:    QRS Duration: 80 QT Interval:  270 QTC Calculation: 391 R Axis:   58 Text Interpretation:  Atrial fibrillation Minimal ST depression Baseline  wander in lead(s) V3 No old tracing to compare Confirmed by [facility]  MD,  TREY ([id number]) on 03/22/2014 3:25:01 PM      MDM   Final diagnoses:  Abscess  Sepsis, due to unspecified organism  Atrial fibrillation with RVR  Renal insufficiency    63 yo male with large abscess to left buttock.  IV Clinda given.  CT shows  fluid collection tracking to coccyx.  Consulted Gen Surgery (Dr. Janee Morn) who will evaluate.  Workup also shows severe leukocytosis and moderate renal dysfunction of unknown chronicity.  EKG shows a fib with tachycardic rate (also unknown to patient).  Will treat initially with antipyretics and fluids given his septic state.  Also consulted family practice who will admit.      Candyce Churn III, MD 03/22/14 [phone]

## 2014-03-22 NOTE — Transfer of Care (Signed)
Immediate Anesthesia Transfer of Care Note  Patient: Jeffery Christian  Procedure(s) Performed: Procedure(s): INCISION AND DRAINAGE ABSCESS LEFT BUTTOCK ABSCESS (Left)  Patient Location: PACU  Anesthesia Type:General  Level of Consciousness: sedated  Airway & Oxygen Therapy: Patient Spontanous Breathing and Patient connected to face mask oxygen  Post-op Assessment: Report given to PACU RN and Post -op Vital signs reviewed and stable  Post vital signs: Reviewed and stable  Complications: No apparent anesthesia complications

## 2014-03-22 NOTE — ED Notes (Signed)
Attempted to contact Jeffery Christian re: Creat level of 2.13 exceeding threshold for IV contrast, left message with Secretary, who was going to ask her to call CT @ [phone]

## 2014-03-22 NOTE — Anesthesia Procedure Notes (Signed)
Procedure Name: Intubation Date/Time: 03/22/2014 5:19 PM Performed by: Alanda Amass A Pre-anesthesia Checklist: Patient identified, Timeout performed, Emergency Drugs available, Suction available and Patient being monitored Patient Re-evaluated:Patient Re-evaluated prior to inductionOxygen Delivery Method: Circle system utilized Preoxygenation: Pre-oxygenation with 100% oxygen Intubation Type: IV induction, Rapid sequence and Cricoid Pressure applied Tube type: Oral Tube size: 8.0 mm Number of attempts: 1 Airway Equipment and Method: Video-laryngoscopy and Stylet Placement Confirmation: ETT inserted through vocal cords under direct vision,  breath sounds checked- equal and bilateral and positive ETCO2 Secured at: 23 cm Tube secured with: Tape Dental Injury: Teeth and Oropharynx as per pre-operative assessment

## 2014-03-23 DIAGNOSIS — I059 Rheumatic mitral valve disease, unspecified: Secondary | ICD-10-CM

## 2014-03-23 DIAGNOSIS — L0390 Cellulitis, unspecified: Secondary | ICD-10-CM

## 2014-03-23 DIAGNOSIS — I4891 Unspecified atrial fibrillation: Secondary | ICD-10-CM

## 2014-03-23 DIAGNOSIS — L0291 Cutaneous abscess, unspecified: Secondary | ICD-10-CM

## 2014-03-23 LAB — HEMOGLOBIN A1C
Hgb A1c MFr Bld: 5.1 % (ref <5.7)
Mean Plasma Glucose: 100 mg/dL (ref <117)

## 2014-03-23 LAB — CBC
HEMATOCRIT: 43.8 % (ref 39.0–52.0)
HEMOGLOBIN: 14.5 g/dL (ref 13.0–17.0)
MCH: 27.7 pg (ref 26.0–34.0)
MCHC: 33.1 g/dL (ref 30.0–36.0)
MCV: 83.6 fL (ref 78.0–100.0)
Platelets: 283 10*3/uL (ref 150–400)
RBC: 5.24 MIL/uL (ref 4.22–5.81)
RDW: 15.2 % (ref 11.5–15.5)
WBC: 19.6 10*3/uL — AB (ref 4.0–10.5)

## 2014-03-23 LAB — BASIC METABOLIC PANEL
BUN: 35 mg/dL — ABNORMAL HIGH (ref 6–23)
CHLORIDE: 96 meq/L (ref 96–112)
CO2: 25 meq/L (ref 19–32)
CREATININE: 2.3 mg/dL — AB (ref 0.50–1.35)
Calcium: 8 mg/dL — ABNORMAL LOW (ref 8.4–10.5)
GFR calc non Af Amer: 29 mL/min — ABNORMAL LOW (ref >90)
GFR, EST AFRICAN AMERICAN: 34 mL/min — AB (ref >90)
Glucose, Bld: 138 mg/dL — ABNORMAL HIGH (ref 70–99)
POTASSIUM: 4.2 meq/L (ref 3.7–5.3)
Sodium: 133 mEq/L — ABNORMAL LOW (ref 137–147)

## 2014-03-23 LAB — RAPID URINE DRUG SCREEN, HOSP PERFORMED
Amphetamines: NOT DETECTED
BARBITURATES: NOT DETECTED
Benzodiazepines: POSITIVE — AB
COCAINE: NOT DETECTED
Opiates: POSITIVE — AB
Tetrahydrocannabinol: NOT DETECTED

## 2014-03-23 MED ORDER — DILTIAZEM HCL 30 MG PO TABS
30.0000 mg | ORAL_TABLET | Freq: Four times a day (QID) | ORAL | Status: DC
  Administered 2014-03-23 – 2014-03-24 (×4): 30 mg via ORAL
  Filled 2014-03-23 (×7): qty 1

## 2014-03-23 MED ORDER — METOPROLOL SUCCINATE ER 50 MG PO TB24
50.0000 mg | ORAL_TABLET | Freq: Every day | ORAL | Status: DC
  Administered 2014-03-23 – 2014-03-24 (×2): 50 mg via ORAL
  Filled 2014-03-23 (×2): qty 1

## 2014-03-23 MED ORDER — ADULT MULTIVITAMIN W/MINERALS CH
1.0000 | ORAL_TABLET | Freq: Every day | ORAL | Status: DC
  Administered 2014-03-23 – 2014-03-24 (×2): 1 via ORAL
  Filled 2014-03-23 (×2): qty 1

## 2014-03-23 MED ORDER — LORAZEPAM 0.5 MG PO TABS: 1.0000 mg | ORAL_TABLET | Freq: Four times a day (QID) | ORAL | Status: DC | PRN

## 2014-03-23 MED ORDER — SODIUM CHLORIDE 0.9 % IV SOLN
INTRAVENOUS | Status: DC
Start: 2014-03-23 — End: 2014-03-24
  Administered 2014-03-23: 13:00:00 via INTRAVENOUS

## 2014-03-23 MED ORDER — LORAZEPAM 2 MG/ML IJ SOLN: 1.0000 mg | Freq: Four times a day (QID) | INTRAMUSCULAR | Status: DC | PRN

## 2014-03-23 MED ORDER — THIAMINE HCL 100 MG/ML IJ SOLN
100.0000 mg | Freq: Every day | INTRAMUSCULAR | Status: DC
  Filled 2014-03-23 (×2): qty 1

## 2014-03-23 MED ORDER — OXYCODONE-ACETAMINOPHEN 5-325 MG PO TABS: 1.0000 | ORAL_TABLET | Freq: Four times a day (QID) | ORAL | Status: DC | PRN

## 2014-03-23 MED ORDER — FOLIC ACID 1 MG PO TABS
1.0000 mg | ORAL_TABLET | Freq: Every day | ORAL | Status: DC
Start: 2014-03-23 — End: 2014-03-24
  Administered 2014-03-23 – 2014-03-24 (×2): 1 mg via ORAL
  Filled 2014-03-23 (×2): qty 1

## 2014-03-23 MED ORDER — VITAMIN B-1 100 MG PO TABS
100.0000 mg | ORAL_TABLET | Freq: Every day | ORAL | Status: DC
  Administered 2014-03-23 – 2014-03-24 (×2): 100 mg via ORAL
  Filled 2014-03-23 (×2): qty 1

## 2014-03-23 NOTE — Progress Notes (Signed)
Patient ID: Jeffery Christian, male   DOB: 10/11/1950, 63 y.o.   MRN: [id number]   Subjective: Afebrile, little discomfort.    Objective:  Vital signs:  Filed Vitals:   03/23/14 0419 03/23/14 0551 03/23/14 0633 03/23/14 0801  BP:  119/99  121/64  Pulse: 87 86  90  Temp:  98.2 F (36.8 C)  100.1 F (37.8 C)  TempSrc:  Oral  Oral  Resp: $Remo'29 25  24  'fWucw$ Height:      Weight:   204 lb 9.4 oz (92.8 kg)   SpO2: 93% 95%  96%    Last BM Date: 03/22/14  Intake/Output   Yesterday:  06/21 0701 - 06/22 0700 In: 3230.5 [P.O.:1200; I.V.:1930.5; IV Piggyback:100] Out: 625 [Urine:600; Blood:25] This shift:    I/O last 3 completed shifts: In: 3230.5 [P.O.:1200; I.V.:1930.5; IV Piggyback:100] Out: 625 [Urine:600; Blood:25] Total I/O In: 480 [P.O.:480] Out: -   Physical Exam: General: Pt awake/alert/oriented x4 in no acute distress Skin: right perirectal wound-clean, surrounding erythema and induration without areas of fluctuance.  Wound repacked.    Problem List:   Active Problems:   Left buttock abscess    Results:   Labs: Results for orders placed during the hospital encounter of 03/22/14 (from the past 48 hour(s))  CBC WITH DIFFERENTIAL     Status: Abnormal   Collection Time    03/22/14 12:47 PM      Result Value Ref Range   WBC 25.9 (*) 4.0 - 10.5 K/uL   Comment: WHITE COUNT CONFIRMED ON SMEAR   RBC 5.81  4.22 - 5.81 MIL/uL   Hemoglobin 16.2  13.0 - 17.0 g/dL   HCT 48.3  39.0 - 52.0 %   MCV 83.1  78.0 - 100.0 fL   MCH 27.9  26.0 - 34.0 pg   MCHC 33.5  30.0 - 36.0 g/dL   RDW 14.9  11.5 - 15.5 %   Platelets 362  150 - 400 K/uL   Neutrophils Relative % 90 (*) 43 - 77 %   Lymphocytes Relative 5 (*) 12 - 46 %   Monocytes Relative 5  3 - 12 %   Eosinophils Relative 0  0 - 5 %   Basophils Relative 0  0 - 1 %   Neutro Abs 23.3 (*) 1.7 - 7.7 K/uL   Lymphs Abs 1.3  0.7 - 4.0 K/uL   Monocytes Absolute 1.3 (*) 0.1 - 1.0 K/uL   Eosinophils Absolute 0.0  0.0 - 0.7 K/uL   Basophils  Absolute 0.0  0.0 - 0.1 K/uL   WBC Morphology TOXIC GRANULATION     Comment: INCREASED BANDS (>20% BANDS)  BASIC METABOLIC PANEL     Status: Abnormal   Collection Time    03/22/14 12:47 PM      Result Value Ref Range   Sodium 135 (*) 137 - 147 mEq/L   Potassium 4.2  3.7 - 5.3 mEq/L   Chloride 96  96 - 112 mEq/L   CO2 24  19 - 32 mEq/L   Glucose, Bld 152 (*) 70 - 99 mg/dL   BUN 25 (*) 6 - 23 mg/dL   Creatinine, Ser 2.13 (*) 0.50 - 1.35 mg/dL   Calcium 8.8  8.4 - 10.5 mg/dL   GFR calc non Af Amer 32 (*) >90 mL/min   GFR calc Af Amer 37 (*) >90 mL/min   Comment: (NOTE)     The eGFR has been calculated using the CKD EPI equation.     This  calculation has not been validated in all clinical situations.     eGFR's persistently <90 mL/min signify possible Chronic Kidney     Disease.  MRSA PCR SCREENING     Status: None   Collection Time    03/22/14  7:06 PM      Result Value Ref Range   MRSA by PCR NEGATIVE  NEGATIVE   Comment:            The GeneXpert MRSA Assay (FDA     approved for NASAL specimens     only), is one component of a     comprehensive MRSA colonization     surveillance program. It is not     intended to diagnose MRSA     infection nor to guide or     monitor treatment for     MRSA infections.  HEMOGLOBIN A1C     Status: None   Collection Time    03/22/14  7:40 PM      Result Value Ref Range   Hemoglobin A1C 5.1  <5.7 %   Comment: (NOTE)                                                                               According to the ADA Clinical Practice Recommendations for 2011, when     HbA1c is used as a screening test:      >=6.5%   Diagnostic of Diabetes Mellitus               (if abnormal result is confirmed)     5.7-6.4%   Increased risk of developing Diabetes Mellitus     References:Diagnosis and Classification of Diabetes Mellitus,Diabetes     Care,2011,34(Suppl 1):S62-S69 and Standards of Medical Care in             Diabetes - 2011,Diabetes  Care,2011,34 (Suppl 1):S11-S61.   Mean Plasma Glucose 100  <117 mg/dL   Comment: Performed at [facility]  TSH     Status: None   Collection Time    03/22/14  7:40 PM      Result Value Ref Range   TSH 3.560  0.350 - 4.500 uIU/mL    Imaging / Studies: Ct Pelvis Wo Contrast  03/22/2014   CLINICAL DATA:  abscess to buttocks ulceration, ? deep fascial infectio- cannot have IV due to elevated Creat  Go  EXAM: CT PELVIS WITHOUT CONTRAST  TECHNIQUE: Multidetector CT imaging of the pelvis was performed following the standard protocol without intravenous contrast.  COMPARISON:  None.  FINDINGS: Adjacent to the gluteal fall on the left is a 6.8 x 3.4 cm low attenuating focus in the subcutaneous fat. The borders of this finding are irregular with Hounsfield units of 25. There is thickening of the overlying skin and inflammatory change extending into the surrounding gluteal fat. This area extends to the distal portion of the coccyx. Differential considerations a phlegmon versus an early or complex viscus abscess. Osteomyelitis of the distal coccyx cannot be excluded. This finding is not having well defined appearance which be consistent with a drainable abscess.  The gluteal musculature is symmetric. There is no evidence of infiltration or edema. The remaining pelvic musculature  and subcutaneous fat is homogeneous and symmetric without infiltration. No further evidence of free fluid or loculated fluid collections.  The intrapelvic contents are unremarkable. The bowel is negative. A very small amount of inflammatory change in the mid to lower presacral region. The visualized non-opacified vascular structures unremarkable. No lower abdominal wall or inguinal hernia. The prostate is prominent and there is indentation along the base of the bladder from the prostate.  There is no evidence of acute fracture or dislocation within the osseous structures.  IMPRESSION: Focal complex appearing collection adjacent to  the gluteal fold region on the left. This area extends to the tip of the coccyx. Differential considerations are a phlegmon. Osteomyelitis within the distal coccyx cannot be excluded. Surgical consultation recommended. These results were called by telephone at the time of interpretation on 03/22/2014 at 3:55 PM to the charge nurse of the emergency department, [place] GROCE, who verbally acknowledged these results.  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   Electronically Signed   By: Margaree Mackintosh M.D.   On: 03/22/2014 15:58    Scheduled Meds: . clindamycin (CLEOCIN) IV  600 mg Intravenous 3 times per day  . docusate sodium  100 mg Oral BID  . heparin  5,000 Units Subcutaneous 3 times per day  . sodium chloride  3 mL Intravenous Q12H   Continuous Infusions: . diltiazem (CARDIZEM) infusion 10 mg/hr (03/23/14 0800)   PRN Meds:.acetaminophen, acetaminophen, ondansetron (ZOFRAN) IV, ondansetron, oxyCODONE-acetaminophen   Antibiotics: Anti-infectives   Start     Dose/Rate Route Frequency Ordered Stop   03/22/14 2200  clindamycin (CLEOCIN) IVPB 600 mg     600 mg 100 mL/hr over 30 Minutes Intravenous 3 times per day 03/22/14 2018     03/22/14 1315  clindamycin (CLEOCIN) IVPB 600 mg     600 mg 100 mL/hr over 30 Minutes Intravenous  Once 03/22/14 1305 03/22/14 1409      Assessment/Plan INCISION AND DRAINAGE ABSCESS LEFT BUTTOCK ABSCESS---Dr. Dennis Bast POD#1 -start BID wet to dry dressing changes -continue inpatient, still has some surrounding erythema -follow cultures, negative to date -c/w IV antibiotics -mobilize as tolerated  -pain control, tolerated dressing change w/o pain meds -will follow   Erby Pian, [facility] Surgery Pager [phone] Office [phone]  03/23/2014 9:45 AM

## 2014-03-23 NOTE — Progress Notes (Signed)
  Echocardiogram 2D Echocardiogram has been performed.  Cathie Beams 03/23/2014, 12:05 PM

## 2014-03-23 NOTE — Progress Notes (Signed)
Family Medicine Teaching Service Daily Progress Note Intern Pager: [phone]  Patient name: Jeffery Christian Medical record number: [id number] Date of birth: 1951-03-20 Age: 63 y.o. Gender: male  Primary Care Provider: No PCP Per Patient Consultants: General Surgery, Cardiology Code Status: Full (confirmed on admission)  Pt Overview and Major Events to Date:  6/21 - Admitted, Gen Surg >> to OR  Assessment and Plan: Jeffery Christian is a 63 y.o. male presenting with left buttock abscess. CT pelvis concerning for 6.8 x 3.4 cm abscess of the left buttock. PMH is significant for gout. In the to step down unit for close monitoring of sepsis and A. fib with RVR. Dr. Jennette Kettle attending.  # Left buttock abscess, s/p surgical I&D in OR (6/21) - Stable - On admission, WBC 25.9, febrile, tachycardic. CT concerning for 6.8 x 3.4 cm abscess, surgery consulted in ED and patient taken directly to OR for I&D - Currently remains in SDU, anticipate transfer out of SDU tomorrow - Consult General Surgery - Greatly appreciate recommendations / management - BID wet to dry dressings - Continue Clindamycin IV (6/21>>) - follow-up Blood cultures / Wound cultures - currently NGTD - WBC 25.9 >> 19, follow-up CBC - Pain control - Percocet PRN  # A. fib with RVR - Resolved - Likely transient d/t sepsis, however uncertain if he has arrhythmia at baseline. Pt w/o regular follow-up. No known cardiac history. - Currently resolved, now NSR, rate controlled on Diltiazem - TSH nml - Consult Cardiology - re: AFib - greatly appreciate recommendations - Transition to Metoprolol-XL 50mg  daily PO, discontinued Diltiazem gtt - Ordered 2D ECHO, eval LV fxn, Cardiology with concern of soft systolic murmur on exam - Start ASA 81mg  daily  # AKI - Uncertain of pts baseline. Reports no kidney disease. Likey injury from decreased PO intake. No contrast was given for CT.  - Increasing Cr 2.13 >> 2.30 - Increase IVF hydration to NS 100 cc / hr,  encourage PO intake - avoid nephrotoxic agents - f/u BMET in AM  FEN/GI: Regular diet / NS @ 100 cc/hr Prophylaxis: Heparin SQ after surgery.  Disposition: Pending clinical improvement and surgical recommendations, continue IV antibiotics, surgery following, AFib RVR since converted to NSR, transitioned off Diltiazem to Metoprolol PO, Cardiology following, transfer out of SDU tomorrow.  Subjective:  Complains of persistent Left buttock pain at surgical/abscess site, improved but not controlled on pain medicine. Denies CP, SOB, palpitations.  Objective: Temp:  [97.4 F (36.3 C)-101.8 F (38.8 C)] 100.1 F (37.8 C) (06/22 0801) Pulse Rate:  [75-154] 90 (06/22 0801) Resp:  [19-34] 24 (06/22 0801) BP: (95-132)/(47-99) 121/64 mmHg (06/22 0801) SpO2:  [83 %-100 %] 96 % (06/22 0801) Weight:  [204 lb 9.4 oz (92.8 kg)] 204 lb 9.4 oz (92.8 kg) (06/22 9518) Physical Exam: General: well-appearing, uncomfortable d/t L-buttock pain, NAD Cardiovascular: sinus mild tachycardia, soft systolic murmur Respiratory: CTAB, nml effort Abdomen: soft, NTND, +active BS Extremities: moves all ext, non-tender, no edema Skin - L-buttock with appropriate dressings in place post-op, no drainage, dry and intact Neuro - AAOx3, grossly non-focal   Laboratory:  Recent Labs Lab 03/22/14 1247 03/23/14 1041  WBC 25.9* 19.6*  HGB 16.2 14.5  HCT 48.3 43.8  PLT 362 283    Recent Labs Lab 03/22/14 1247 03/23/14 1041  NA 135* 133*  K 4.2 4.2  CL 96 96  CO2 24 25  BUN 25* 35*  CREATININE 2.13* 2.30*  CALCIUM 8.8 8.0*  GLUCOSE 152* 138*  Blood culture (6/21), x 3 >> pending  Wound culture (6/21, L-buttock abscess, I&D in OR) >> pending  MRSA, screen - negative  TSH - 3.560 HgbA1c - 5.1  Imaging/Diagnostic Tests:  6/21 EKG AFib RVR  6/21 CT Pelvis w/o IMPRESSION:  Focal complex appearing collection adjacent to the gluteal fold  region on the left. This area extends to the tip of the  coccyx.  Differential considerations are a phlegmon. Osteomyelitis within the  distal coccyx cannot be excluded. Surgical consultation recommended.  These results were called by telephone at the time of interpretation  on 03/22/2014 at 3:55 PM to the charge nurse of the emergency  department, [place] GROCE, who verbally acknowledged these results.  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   Jeffery PilarAlexander Karamalegos, DO 03/23/2014, 12:01 PM PGY-1, [facility] FPTS Intern pager: [phone], text pages welcome

## 2014-03-23 NOTE — Progress Notes (Signed)
Agree cont iv abx until erythema resolves, wbc in am

## 2014-03-23 NOTE — ED Provider Notes (Signed)
Medical screening examination/treatment/procedure(s) were conducted as a shared visit with non-physician practitioner(s) and myself.  I personally evaluated the patient during the encounter.   EKG Interpretation   Date/Time:  Sunday March 22 2014 15:10:08 EDT Ventricular Rate:  126 PR Interval:    QRS Duration: 80 QT Interval:  270 QTC Calculation: 391 R Axis:   58 Text Interpretation:  Atrial fibrillation Minimal ST depression Baseline  wander in lead(s) V3 No old tracing to compare Confirmed by [facility]  MD,  TREY [id number]) on 03/22/2014 3:25:01 PM         Candyce Churn III, MD 03/23/14 [phone]

## 2014-03-23 NOTE — Progress Notes (Signed)
Received page from nursing indicating that patient returned back into A fib.  HR 90's - 100's.  Will add PO Cardizem for rate control.

## 2014-03-23 NOTE — Progress Notes (Signed)
Offered Pt a bath. Pt. Declined and requested only bath wash and sheets change.

## 2014-03-23 NOTE — Consult Note (Signed)
Primary Physician:  None Primary Cardiologist:  New   HPI: Patient is a 63 yo who we are asked to see re atrial fibrillation. Patient admitted with abscess of buttock  Febrille  WBC 25.9  Found to be in Afib with RVR.   Patient denies palpitations.  Says his breathing was OK  No Cp  No SOB  No dizziness. Since admit he converted to SR/ST.      History reviewed. No pertinent past medical history.  No prescriptions prior to admission     . clindamycin (CLEOCIN) IV  600 mg Intravenous 3 times per day  . docusate sodium  100 mg Oral BID  . heparin  5,000 Units Subcutaneous 3 times per day  . sodium chloride  3 mL Intravenous Q12H    Infusions:    No Known Allergies  History   Social History  . Marital Status: Divorced    Spouse Name: N/A    Number of Children: N/A  . Years of Education: N/A   Occupational History  . Not on file.   Social History Main Topics  . Smoking status: Never Smoker   . Smokeless tobacco: Not on file  . Alcohol Use: No  . Drug Use: No  . Sexual Activity: Not on file   Other Topics Concern  . Not on file   Social History Narrative  . No narrative on file    No family history on file.  REVIEW OF SYSTEMS:  All systems reviewed  Negative to the above problem except as noted above.    PHYSICAL EXAM: Filed Vitals:   03/23/14 0801  BP: 121/64  Pulse: 90  Temp: 100.1 F (37.8 C)  Resp: 24     Intake/Output Summary (Last 24 hours) at 03/23/14 1017 Last data filed at 03/23/14 0928  Gross per 24 hour  Intake 3710.51 ml  Output    625 ml  Net 3085.51 ml    General:  Well appearing. No respiratory difficulty HEENT: normal Neck: supple. JVP mildly increased Carotids 2+ bilat; no bruits. No lymphadenopathy or thryomegaly appreciated. Cor: PMI nondisplaced. Regular rate & rhythm. No rubs, gallopsGr I-II systolic murmur at apex.   Lungs: clear Abdomen: soft, nontender, nondistended. No hepatosplenomegaly. No bruits or masses.  Good bowel sounds. Extremities: no cyanosis, clubbing, rash, edema Neuro: alert & oriented x 3, cranial nerves grossly intact. moves all 4 extremities w/o difficulty. Affect pleasant.  ECG:  Atrial fib  126 bpm  (6/21) Tele:  NOw in SR.    Results for orders placed during the hospital encounter of 03/22/14 (from the past 24 hour(s))  CBC WITH DIFFERENTIAL     Status: Abnormal   Collection Time    03/22/14 12:47 PM      Result Value Ref Range   WBC 25.9 (*) 4.0 - 10.5 K/uL   RBC 5.81  4.22 - 5.81 MIL/uL   Hemoglobin 16.2  13.0 - 17.0 g/dL   HCT 47.8  29.5 - 62.1 %   MCV 83.1  78.0 - 100.0 fL   MCH 27.9  26.0 - 34.0 pg   MCHC 33.5  30.0 - 36.0 g/dL   RDW 30.8  65.7 - 84.6 %   Platelets 362  150 - 400 K/uL   Neutrophils Relative % 90 (*) 43 - 77 %   Lymphocytes Relative 5 (*) 12 - 46 %   Monocytes Relative 5  3 - 12 %   Eosinophils Relative 0  0 - 5 %  Basophils Relative 0  0 - 1 %   Neutro Abs 23.3 (*) 1.7 - 7.7 K/uL   Lymphs Abs 1.3  0.7 - 4.0 K/uL   Monocytes Absolute 1.3 (*) 0.1 - 1.0 K/uL   Eosinophils Absolute 0.0  0.0 - 0.7 K/uL   Basophils Absolute 0.0  0.0 - 0.1 K/uL   WBC Morphology TOXIC GRANULATION    BASIC METABOLIC PANEL     Status: Abnormal   Collection Time    03/22/14 12:47 PM      Result Value Ref Range   Sodium 135 (*) 137 - 147 mEq/L   Potassium 4.2  3.7 - 5.3 mEq/L   Chloride 96  96 - 112 mEq/L   CO2 24  19 - 32 mEq/L   Glucose, Bld 152 (*) 70 - 99 mg/dL   BUN 25 (*) 6 - 23 mg/dL   Creatinine, Ser 5.782.13 (*) 0.50 - 1.35 mg/dL   Calcium 8.8  8.4 - 46.910.5 mg/dL   GFR calc non Af Amer 32 (*) >90 mL/min   GFR calc Af Amer 37 (*) >90 mL/min  MRSA PCR SCREENING     Status: None   Collection Time    03/22/14  7:06 PM      Result Value Ref Range   MRSA by PCR NEGATIVE  NEGATIVE  HEMOGLOBIN A1C     Status: None   Collection Time    03/22/14  7:40 PM      Result Value Ref Range   Hemoglobin A1C 5.1  <5.7 %   Mean Plasma Glucose 100  <117 mg/dL  TSH      Status: None   Collection Time    03/22/14  7:40 PM      Result Value Ref Range   TSH 3.560  0.350 - 4.500 uIU/mL   Ct Pelvis Wo Contrast  03/22/2014   CLINICAL DATA:  abscess to buttocks ulceration, ? deep fascial infectio- cannot have IV due to elevated Creat  Go  EXAM: CT PELVIS WITHOUT CONTRAST  TECHNIQUE: Multidetector CT imaging of the pelvis was performed following the standard protocol without intravenous contrast.  COMPARISON:  None.  FINDINGS: Adjacent to the gluteal fall on the left is a 6.8 x 3.4 cm low attenuating focus in the subcutaneous fat. The borders of this finding are irregular with Hounsfield units of 25. There is thickening of the overlying skin and inflammatory change extending into the surrounding gluteal fat. This area extends to the distal portion of the coccyx. Differential considerations a phlegmon versus an early or complex viscus abscess. Osteomyelitis of the distal coccyx cannot be excluded. This finding is not having well defined appearance which be consistent with a drainable abscess.  The gluteal musculature is symmetric. There is no evidence of infiltration or edema. The remaining pelvic musculature and subcutaneous fat is homogeneous and symmetric without infiltration. No further evidence of free fluid or loculated fluid collections.  The intrapelvic contents are unremarkable. The bowel is negative. A very small amount of inflammatory change in the mid to lower presacral region. The visualized non-opacified vascular structures unremarkable. No lower abdominal wall or inguinal hernia. The prostate is prominent and there is indentation along the base of the bladder from the prostate.  There is no evidence of acute fracture or dislocation within the osseous structures.  IMPRESSION: Focal complex appearing collection adjacent to the gluteal fold region on the left. This area extends to the tip of the coccyx. Differential considerations are a phlegmon. Osteomyelitis within  the  distal coccyx cannot be excluded. Surgical consultation recommended. These results were called by telephone at the time of interpretation on 03/22/2014 at 3:55 PM to the charge nurse of the emergency department, [place] GR[facility], who verbally acknowledged these results.  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   Electronically Signed   By: Salome Holmes M.D.   On: 03/22/2014 15:58     ASSESSMENT:  Patient is a 63 yo with no prior cardiac history  Presents with abscess, febrile and in afib   He was asymptomatic on arrival  Converted to SR. TSH normal On exam, soft systolic murmur at apex  JVP is mildly increased  Otherwise normal. On IV dilit now. WOuld transition to PO meds (Toprol XL)  Follow HR and BP D/C diltiazem. WOuld recomm echo to evaluate LV function, chamber sizes. Valve function. Would use ASA 81 mg if OK from surgical standpoint.   Treat infection.   Will follow.  2.  Murmur  Echo  3  Renal  WIll need to be followed.  I have given names of providers  Patient should have primary MD to go to in future.

## 2014-03-23 NOTE — Progress Notes (Signed)
[facility] ATTENDING ADMISSION NOTE  Kehinde Eniola,MD  I have discussed patient with the resident and the admitting attending (Dr Neal), I reviewed their chart. I agree with the resident's findings, assessment and care plan.  

## 2014-03-23 NOTE — H&P (Signed)
Call Pager [phone] for any questions or notifications regarding this patient  [facility] Attending Admission Note: Sara Neal MD Attending pager:[phone] I  have seen and examined this patient, reviewed their chart. I have discussed this patient with the resident. I agree with the resident's findings, assessment and care plan. 

## 2014-03-24 ENCOUNTER — Encounter (HOSPITAL_COMMUNITY): Payer: Self-pay | Admitting: General Surgery

## 2014-03-24 DIAGNOSIS — L0231 Cutaneous abscess of buttock: Secondary | ICD-10-CM

## 2014-03-24 DIAGNOSIS — L03317 Cellulitis of buttock: Secondary | ICD-10-CM

## 2014-03-24 LAB — BASIC METABOLIC PANEL
BUN: 32 mg/dL — ABNORMAL HIGH (ref 6–23)
CO2: 24 meq/L (ref 19–32)
CREATININE: 1.94 mg/dL — AB (ref 0.50–1.35)
Calcium: 8.5 mg/dL (ref 8.4–10.5)
Chloride: 98 mEq/L (ref 96–112)
GFR calc Af Amer: 41 mL/min — ABNORMAL LOW (ref >90)
GFR calc non Af Amer: 36 mL/min — ABNORMAL LOW (ref >90)
GLUCOSE: 157 mg/dL — AB (ref 70–99)
POTASSIUM: 4.3 meq/L (ref 3.7–5.3)
Sodium: 137 mEq/L (ref 137–147)

## 2014-03-24 LAB — CBC
HCT: 45.5 % (ref 39.0–52.0)
Hemoglobin: 15.4 g/dL (ref 13.0–17.0)
MCH: 28.2 pg (ref 26.0–34.0)
MCHC: 33.8 g/dL (ref 30.0–36.0)
MCV: 83.3 fL (ref 78.0–100.0)
Platelets: 260 10*3/uL (ref 150–400)
RBC: 5.46 MIL/uL (ref 4.22–5.81)
RDW: 15.4 % (ref 11.5–15.5)
WBC: 14.4 10*3/uL — ABNORMAL HIGH (ref 4.0–10.5)

## 2014-03-24 MED ORDER — DILTIAZEM HCL ER COATED BEADS 180 MG PO CP24: 180.0000 mg | ORAL_CAPSULE | Freq: Every day | ORAL | Status: DC

## 2014-03-24 MED ORDER — LORAZEPAM 0.5 MG PO TABS
1.0000 mg | ORAL_TABLET | Freq: Four times a day (QID) | ORAL | Status: DC
  Administered 2014-03-24: 1 mg via ORAL
  Filled 2014-03-24: qty 2

## 2014-03-24 MED ORDER — CLINDAMYCIN HCL 300 MG PO CAPS: 600.0000 mg | ORAL_CAPSULE | Freq: Three times a day (TID) | ORAL | Status: AC

## 2014-03-24 MED ORDER — OXYCODONE-ACETAMINOPHEN 5-325 MG PO TABS: 1.0000 | ORAL_TABLET | Freq: Four times a day (QID) | ORAL | Status: DC | PRN

## 2014-03-24 MED ORDER — LORAZEPAM 0.5 MG PO TABS: 1.0000 mg | ORAL_TABLET | ORAL | Status: DC

## 2014-03-24 NOTE — Progress Notes (Signed)
03/24/14 Patient left AMA at 12:02 pm, Internal Medicine MD is aware. Patient pull iv out and took telemetry off. Prescription was given to patient by MD. Patient was alert, oriented when left the unit and he was aware is dressing was not changed. [facility] RN.

## 2014-03-24 NOTE — Progress Notes (Signed)
Patient ID: Jeffery GreenhouseGary Christian, male   DOB: 10/19/1950, 63 y.o.   MRN: [id number] 2 Days Post-Op  Subjective: Patient wants to go home.  Says he will leave AMA.  Buttock feels better  Objective: Vital signs in last 24 hours: Temp:  [98 F (36.7 C)-101.9 F (38.8 C)] 98.5 F (36.9 C) (06/23 0510) Pulse Rate:  [61-103] 61 (06/23 0626) Resp:  [22-36] 36 (06/23 0400) BP: (121-142)/(64-117) 142/92 mmHg (06/23 0626) SpO2:  [92 %-98 %] 94 % (06/23 0626) Weight:  [204 lb 9.4 oz (92.8 kg)] 204 lb 9.4 oz (92.8 kg) (06/23 0400) Last BM Date: 03/23/14  Intake/Output from previous day: 06/22 0701 - 06/23 0700 In: 2269.7 [P.O.:720; I.V.:1549.7] Out: 1375 [Urine:1375] Intake/Output this shift:    PE: Skin: left gluteal wound is clean and packed.  Minimal erythema around wound, less tender.  Lab Results:   Recent Labs  03/23/14 1041 03/24/14 0346  WBC 19.6* 14.4*  HGB 14.5 15.4  HCT 43.8 45.5  PLT 283 260   BMET  Recent Labs  03/23/14 1041 03/24/14 0346  NA 133* 137  K 4.2 4.3  CL 96 98  CO2 25 24  GLUCOSE 138* 157*  BUN 35* 32*  CREATININE 2.30* 1.94*  CALCIUM 8.0* 8.5   PT/INR No results found for this basename: LABPROT, INR,  in the last 72 hours CMP     Component Value Date/Time   NA 137 03/24/2014 0346   K 4.3 03/24/2014 0346   CL 98 03/24/2014 0346   CO2 24 03/24/2014 0346   GLUCOSE 157* 03/24/2014 0346   BUN 32* 03/24/2014 0346   CREATININE 1.94* 03/24/2014 0346   CALCIUM 8.5 03/24/2014 0346   GFRNONAA 36* 03/24/2014 0346   GFRAA 41* 03/24/2014 0346   Lipase  No results found for this basename: lipase       Studies/Results: Ct Pelvis Wo Contrast  03/22/2014   CLINICAL DATA:  abscess to buttocks ulceration, ? deep fascial infectio- cannot have IV due to elevated Creat  Go  EXAM: CT PELVIS WITHOUT CONTRAST  TECHNIQUE: Multidetector CT imaging of the pelvis was performed following the standard protocol without intravenous contrast.  COMPARISON:  None.  FINDINGS:  Adjacent to the gluteal fall on the left is a 6.8 x 3.4 cm low attenuating focus in the subcutaneous fat. The borders of this finding are irregular with Hounsfield units of 25. There is thickening of the overlying skin and inflammatory change extending into the surrounding gluteal fat. This area extends to the distal portion of the coccyx. Differential considerations a phlegmon versus an early or complex viscus abscess. Osteomyelitis of the distal coccyx cannot be excluded. This finding is not having well defined appearance which be consistent with a drainable abscess.  The gluteal musculature is symmetric. There is no evidence of infiltration or edema. The remaining pelvic musculature and subcutaneous fat is homogeneous and symmetric without infiltration. No further evidence of free fluid or loculated fluid collections.  The intrapelvic contents are unremarkable. The bowel is negative. A very small amount of inflammatory change in the mid to lower presacral region. The visualized non-opacified vascular structures unremarkable. No lower abdominal wall or inguinal hernia. The prostate is prominent and there is indentation along the base of the bladder from the prostate.  There is no evidence of acute fracture or dislocation within the osseous structures.  IMPRESSION: Focal complex appearing collection adjacent to the gluteal fold region on the left. This area extends to the tip of the coccyx. Differential  considerations are a phlegmon. Osteomyelitis within the distal coccyx cannot be excluded. Surgical consultation recommended. These results were called by telephone at the time of interpretation on 03/22/2014 at 3:55 PM to the charge nurse of the emergency department, [place] [facility], who verbally acknowledged these results.  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   Electronically Signed   By: Salome Holmes M.D.   On: 03/22/2014 15:58    Anti-infectives: Anti-infectives   Start     Dose/Rate  Route Frequency Ordered Stop   03/22/14 2200  clindamycin (CLEOCIN) IVPB 600 mg     600 mg 100 mL/hr over 30 Minutes Intravenous 3 times per day 03/22/14 2018     03/22/14 1315  clindamycin (CLEOCIN) IVPB 600 mg     600 mg 100 mL/hr over 30 Minutes Intravenous  Once 03/22/14 1305 03/22/14 1409       Assessment/Plan  INCISION AND DRAINAGE ABSCESS LEFT BUTTOCK ABSCESS---Dr. Elwyn Lade  POD#2  -BID wet to dry dressing changes  -follow cultures, still pending -c/w antibiotic therapy.  Patient's wound is clean and erythema improved.  Still spiked fever to 101.9 over night  -mobilize as tolerated  -pain control, tolerated dressing change w/o pain meds  -will follow.  Wound is clean, but given fever curve, creatinine, and A fib, not sure patient is truly stable for dc home as he thinks he is.  Defer to primary service.    LOS: 2 days    OSBORNE,KELLY E 03/24/2014, 7:59 AM Pager: [phone]

## 2014-03-24 NOTE — Discharge Summary (Addendum)
Family Medicine Teaching [facility] Discharge/AMA Summary  Patient name: Jeffery Christian Medical record number: [id number] Date of birth: 1951-06-23 Age: 63 y.o. Gender: male Date of Admission: 03/22/2014  Date of Discharge/AMA: 03/24/14 Admitting Physician: Liz MaladyBurke E Thompson, MD  Primary Care Provider: No PCP Per Patient Consultants: General Surgery, Cardiology  Indication for Hospitalization: Left buttock abscess  Discharge Diagnoses/Problem List:  Sepsis, secondary to L-buttock abscess - Improved Left buttock abscess, s/p surgical I&D in OR (6/21) - Improved Atrial Fibrillation with RVR, new onset paroxysmal - Improved AKI HTN  Disposition: Home (Left AMA)  Discharge/AMA Condition: Improved  Brief Hospital Course:  Jeffery GreenhouseGary Lanphere is a 63 y.o. male who presented with left buttock abscess. PMH is significant for gout. Presented with worsening boil for several days that drained pus, associated fevers/chills, otherwise no complaints other than localized pain at L-buttock. In ED, work-up with elevated WBC 25.9, found to have large open bloody drainage abscess wound measuring 9.5 cm x 4 cm, with surround cellulitis of 27 cm x 13 cm, CT Pelvis obtained (confirmed large abscess), started on Clindamycin, Morphine IV, consulted General Surgery, who took patient directly to the OR for I&D, obtained wound cultures. Additionally, found to be in Atrial Fibrillation with RVR, initially started on Diltiazem gtt with some improvement, Cardiology consulted, suspected transient AFib due to acute febrile infection with abscess. Admitted to SDU for Sepsis with source of abscess, continued IV antibiotics, IV Diltiazem, close monitoring. Placed on CIWA due to historical concerns of potential alcohol abuse, despite denial.  During hospitalization, patient demonstrated significant improvement with regards to L-buttock abscess, occasional fevers, last to 101.39F (6/22) with some associated tachycardia, otherwise WBC  trended down to 14.4, cellulitis significantly improved and reduced pain. Blood cultures (NGTD) and wound culture with MRSA (sensitive to Clindamycin). Cardiology continued to follow with initial self resolved episode of AFib, achieved chemical cardioversion with Diltiazem IV transitioned to Metoprolol / Cardizem PO, however with recurrent episode of fever/pain, returned to Atrial Fib with RVR, ECHO obtained (unremarkable with nml EF). Additionally, AKI on admission with elevated Cr > 2.0, no significant h/o CKD or prior Cr, trended down < 1.90 with hydration.  Significant event on 03/24/14 (@ 1200), patient remained in SDU with continued Clindamycin IV, since returned Atrial Fibrillation and overall not meeting all discharge criteria. Patient refusing all further therapy at this time, requesting to leave AMA. Thoroughly discussed our concerns about developing worsening infection, including sepsis, additionally with continued Atrial Fibrillation. Despite discussion, patient adamantly refuses to stay for continued treatment. Patient had already removed telemetry and IV, dressed. Discussed our recommendation for patient to stay for continued IV antibiotics and monitoring. Patient refused again, of note despite patient's frustration for staying in the hospital and concern with cost, he remained very rude and offensive to staff. Printed off prescriptions including Clindamycin PO (complete 14 day course), Cardizem-24 hr 180mg , and Percocet (#10, 0 refills), recommend daily ASA 81mg .  Issues for Follow Up:  1. L-buttock abscess / Cellulitis - MRSA wound culture, complete Clindamycin course, follow-up improvement  2. Atrial Fibrillation, new onset - Follow-up with Cardiology, discharged on ASA 81mg  daily and rate control Cardizem-24 180mg  daily.  Significant Procedures: 1. Surgical I&D L-buttock abscess (in OR) - 03/24/14  Significant Labs and Imaging:   Recent Labs Lab 03/22/14 1247 03/23/14 1041  03/24/14 0346  WBC 25.9* 19.6* 14.4*  HGB 16.2 14.5 15.4  HCT 48.3 43.8 45.5  PLT 362 283 260    Recent Labs Lab 03/22/14 1247 03/23/14  1041 03/24/14 0346  NA 135* 133* 137  K 4.2 4.2 4.3  CL 96 96 98  CO2 24 25 24   GLUCOSE 152* 138* 157*  BUN 25* 35* 32*  CREATININE 2.13* 2.30* 1.94*  CALCIUM 8.8 8.0* 8.5   Blood culture (6/21), x 3 >> NGTD Wound culture (6/21, L-buttock abscess, I&D in OR) >> MRSA (sensitive to Clindamycin)  MRSA, screen - negative  TSH - 3.560  HgbA1c - 5.1  6/22 UDS - positive BDZ, opiates  Imaging/Diagnostic Tests:  6/21 EKG  AFib RVR  6/21 CT Pelvis w/o  IMPRESSION:  Focal complex appearing collection adjacent to the gluteal fold  region on the left. This area extends to the tip of the coccyx.  Differential considerations are a phlegmon. Osteomyelitis within the  distal coccyx cannot be excluded. Surgical consultation recommended.  These results were called by telephone at the time of interpretation  on 03/22/2014 at 3:55 PM to the charge nurse of the emergency  department, [place] GROC[place], who verbally acknowledged these results.  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  6/22 2D ECHO  Study Conclusions - Left ventricle: The cavity size was normal. Wall thickness was normal. Systolic function was normal. The estimated ejection fraction was in the range of 55% to 60%. Left ventricular diastolic function parameters were normal. - Mitral valve: There was mild regurgitation. - Left atrium: The atrium was moderately dilated. - Right atrium: The atrium was mildly dilated.  Results/Tests Pending at Time of Discharge: Blood culture x 2  Discharge Medications:    Medication List         clindamycin 300 MG capsule  Commonly known as:  CLEOCIN  Take 2 capsules (600 mg total) by mouth 3 (three) times daily.     diltiazem 180 MG 24 hr capsule  Commonly known as:  CARDIZEM CD  Take 1 capsule (180 mg total) by mouth daily.      oxyCODONE-acetaminophen 5-325 MG per tablet  Commonly known as:  PERCOCET/ROXICET  Take 1-2 tablets by mouth every 6 (six) hours as needed for severe pain.        Discharge Instructions: Please refer to Patient Instructions section of EMR for full details.  Patient was counseled important signs and symptoms that should prompt return to medical care, changes in medications, dietary instructions, activity restrictions, and follow up appointments.   Follow-Up Appointments:   Saralyn Pilar, DO 03/26/2014, 6:27 AM PGY-1, [facility]

## 2014-03-24 NOTE — Progress Notes (Signed)
Primary Physician:  None Primary Cardiologist:  Harrington Challenger   HPI: Patient is a 63 yo who we are asked to see for  atrial fibrillation. Patient admitted with abscess of buttock  Febrille  WBC 25.9  Found to be in Afib with RVR.   Patient denies palpitations.  Says his breathing was OK  No Cp  No SOB  No dizziness. Since admit he converted to SR/ST but this AM is back in A-fib. He is very anxious this am.   Echo shows normal LV function with mild MR.     History reviewed. No pertinent past medical history.  No prescriptions prior to admission     . clindamycin (CLEOCIN) IV  600 mg Intravenous 3 times per day  . diltiazem  30 mg Oral 4 times per day  . docusate sodium  100 mg Oral BID  . folic acid  1 mg Oral Daily  . heparin  5,000 Units Subcutaneous 3 times per day  . metoprolol succinate  50 mg Oral Daily  . multivitamin with minerals  1 tablet Oral Daily  . sodium chloride  3 mL Intravenous Q12H  . thiamine  100 mg Oral Daily   Or  . thiamine  100 mg Intravenous Daily    Infusions: . sodium chloride 100 mL/hr at 03/23/14 1722    No Known Allergies  History   Social History  . Marital Status: Divorced    Spouse Name: N/A    Number of Children: N/A  . Years of Education: N/A   Occupational History  . Not on file.   Social History Main Topics  . Smoking status: Never Smoker   . Smokeless tobacco: Not on file  . Alcohol Use: No  . Drug Use: No  . Sexual Activity: Not on file   Other Topics Concern  . Not on file   Social History Narrative  . No narrative on file    No family history on file.  REVIEW OF SYSTEMS:  All systems reviewed  Negative to the above problem except as noted above.    PHYSICAL EXAM: Filed Vitals:   03/24/14 0626  BP: 142/92  Pulse: 61  Temp:   Resp:      Intake/Output Summary (Last 24 hours) at 03/24/14 0807 Last data filed at 03/24/14 2334  Gross per 24 hour  Intake 2259.66 ml  Output   1375 ml  Net 884.66 ml     General:  He is very anxious today.   HEENT: normal Neck: supple. JVP mildly increased Carotids 2+ bilat; no bruits. No lymphadenopathy or thryomegaly appreciated. Cor: PMI nondisplaced. Regular rate & rhythm. No rubs, gallopsGr 1/6 systolic murmur at apex.   Lungs: clear Abdomen: soft, nontender, nondistended. No hepatosplenomegaly. No bruits or masses. Good bowel sounds. Extremities: no cyanosis, clubbing, rash, edema Neuro: alert & oriented x 3, cranial nerves grossly intact. moves all 4 extremities w/o difficulty. Affect pleasant.  ECG:  Atrial fib  126 bpm  (6/21) Tele:  AF at rate of 105   Results for orders placed during the hospital encounter of 03/22/14 (from the past 24 hour(s))  BASIC METABOLIC PANEL     Status: Abnormal   Collection Time    03/23/14 10:41 AM      Result Value Ref Range   Sodium 133 (*) 137 - 147 mEq/L   Potassium 4.2  3.7 - 5.3 mEq/L   Chloride 96  96 - 112 mEq/L   CO2 25  19 -  32 mEq/L   Glucose, Bld 138 (*) 70 - 99 mg/dL   BUN 35 (*) 6 - 23 mg/dL   Creatinine, Ser 2.30 (*) 0.50 - 1.35 mg/dL   Calcium 8.0 (*) 8.4 - 10.5 mg/dL   GFR calc non Af Amer 29 (*) >90 mL/min   GFR calc Af Amer 34 (*) >90 mL/min  CBC     Status: Abnormal   Collection Time    03/23/14 10:41 AM      Result Value Ref Range   WBC 19.6 (*) 4.0 - 10.5 K/uL   RBC 5.24  4.22 - 5.81 MIL/uL   Hemoglobin 14.5  13.0 - 17.0 g/dL   HCT 43.8  39.0 - 52.0 %   MCV 83.6  78.0 - 100.0 fL   MCH 27.7  26.0 - 34.0 pg   MCHC 33.1  30.0 - 36.0 g/dL   RDW 15.2  11.5 - 15.5 %   Platelets 283  150 - 400 K/uL  URINE RAPID DRUG SCREEN (HOSP PERFORMED)     Status: Abnormal   Collection Time    03/23/14  6:52 PM      Result Value Ref Range   Opiates POSITIVE (*) NONE DETECTED   Cocaine NONE DETECTED  NONE DETECTED   Benzodiazepines POSITIVE (*) NONE DETECTED   Amphetamines NONE DETECTED  NONE DETECTED   Tetrahydrocannabinol NONE DETECTED  NONE DETECTED   Barbiturates NONE DETECTED  NONE  DETECTED  BASIC METABOLIC PANEL     Status: Abnormal   Collection Time    03/24/14  3:46 AM      Result Value Ref Range   Sodium 137  137 - 147 mEq/L   Potassium 4.3  3.7 - 5.3 mEq/L   Chloride 98  96 - 112 mEq/L   CO2 24  19 - 32 mEq/L   Glucose, Bld 157 (*) 70 - 99 mg/dL   BUN 32 (*) 6 - 23 mg/dL   Creatinine, Ser 1.94 (*) 0.50 - 1.35 mg/dL   Calcium 8.5  8.4 - 10.5 mg/dL   GFR calc non Af Amer 36 (*) >90 mL/min   GFR calc Af Amer 41 (*) >90 mL/min  CBC     Status: Abnormal   Collection Time    03/24/14  3:46 AM      Result Value Ref Range   WBC 14.4 (*) 4.0 - 10.5 K/uL   RBC 5.46  4.22 - 5.81 MIL/uL   Hemoglobin 15.4  13.0 - 17.0 g/dL   HCT 45.5  39.0 - 52.0 %   MCV 83.3  78.0 - 100.0 fL   MCH 28.2  26.0 - 34.0 pg   MCHC 33.8  30.0 - 36.0 g/dL   RDW 15.4  11.5 - 15.5 %   Platelets 260  150 - 400 K/uL   Ct Pelvis Wo Contrast  03/22/2014   CLINICAL DATA:  abscess to buttocks ulceration, ? deep fascial infectio- cannot have IV due to elevated Creat  Go  EXAM: CT PELVIS WITHOUT CONTRAST  TECHNIQUE: Multidetector CT imaging of the pelvis was performed following the standard protocol without intravenous contrast.  COMPARISON:  None.  FINDINGS: Adjacent to the gluteal fall on the left is a 6.8 x 3.4 cm low attenuating focus in the subcutaneous fat. The borders of this finding are irregular with Hounsfield units of 25. There is thickening of the overlying skin and inflammatory change extending into the surrounding gluteal fat. This area extends to the distal portion of the  coccyx. Differential considerations a phlegmon versus an early or complex viscus abscess. Osteomyelitis of the distal coccyx cannot be excluded. This finding is not having well defined appearance which be consistent with a drainable abscess.  The gluteal musculature is symmetric. There is no evidence of infiltration or edema. The remaining pelvic musculature and subcutaneous fat is homogeneous and symmetric without  infiltration. No further evidence of free fluid or loculated fluid collections.  The intrapelvic contents are unremarkable. The bowel is negative. A very small amount of inflammatory change in the mid to lower presacral region. The visualized non-opacified vascular structures unremarkable. No lower abdominal wall or inguinal hernia. The prostate is prominent and there is indentation along the base of the bladder from the prostate.  There is no evidence of acute fracture or dislocation within the osseous structures.  IMPRESSION: Focal complex appearing collection adjacent to the gluteal fold region on the left. This area extends to the tip of the coccyx. Differential considerations are a phlegmon. Osteomyelitis within the distal coccyx cannot be excluded. Surgical consultation recommended. These results were called by telephone at the time of interpretation on 03/22/2014 at 3:55 PM to the charge nurse of the emergency department, [place] GROCE, who verbally acknowledged these results.  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   Electronically Signed   By: Margaree Mackintosh M.D.   On: 03/22/2014 15:58     ASSESSMENT:  Patient is a 63 yo with no prior cardiac history  Presents with abscess, febrile and in afib   1. Paroxysmal Atrial fib:  He was asymptomatic on arrival.   TSH normal He has mild MR.  He is on PO dilt and metoprolol.  I dont think he needs both although I'm not opposed to using both as long as his BP and HR tolerate.   Would use ASA 81 mg if OK from surgical standpoint.   Treat infection.    If he does go home, he may follow up with Dr. Dorris Carnes in the office. He needs a primary medical doctor.  2.  Mild MR:  Stable   3  Renal:   WIll need to be followed.  4. Anxiety:  He appears to be withdrawing from something to me.  This is the first time I have met him and I do not know his baseline.  He was + for benzo and opiates on admission.  He is insistent on going home  today.   Thayer Headings, Brooke Bonito., MD, [facility] 03/24/2014, 8:21 AM [address],  [place] Pager [phone]

## 2014-03-24 NOTE — Progress Notes (Signed)
FMTS ATTENDING  NOTE Jeffery Eniola,MD I  have seen and examined this patient, reviewed their chart. I have discussed this patient with the resident. I agree with the resident's findings, assessment and care plan.  Patient seen this morning, stated he feels better and would like to go home. As discussed with him, we can send him home if clinically improved. Gluteal wound assessed this morning, appears clean, surrounding wound mildly erythematous. WBC trending down, however he spiked fever yesterday. Plan is to continue IV antibiotic for 1 or 2 more days. Cardiology to follow for his Afib which seem controlled this morning. Recommending 1-2 more days of hospital stay.

## 2014-03-24 NOTE — Progress Notes (Signed)
Family Medicine Teaching Service Daily Progress Note Intern Pager: [phone]  Patient name: Jeffery Christian Medical record number: [id number] Date of birth: [birth year] Age: 63 y.o. Gender: male  Primary Care Provider: No PCP Per Patient Consultants: General Surgery, Cardiology Code Status: Full (confirmed on admission)  Pt Overview and Major Events to Date:  6/21 - Admitted, Gen Surg >> to OR, AFib RVR placed on Diltiazem gtt, WBC 25.9 6/22 - Cardioversion on Diltiazem, NSR >> transitioned to PO Metoprolol, AKI Cr 2.30, WBC 19.6 6/23 - Cr 1.94, WBC 14.4, Continue Clinda IV, AFib >> rate controlled  Assessment and Plan: Jeffery Christian is a 63 y.o. male presenting with left buttock abscess. CT pelvis concerning for 6.8 x 3.4 cm abscess of the left buttock. PMH is significant for gout. In the to step down unit for close monitoring of sepsis and A. fib with RVR. Dr. Jennette Kettle attending.  # Left buttock abscess, s/p surgical I&D in OR (6/21) - Improved - On admission, WBC 25.9, febrile, tachycardic. CT concerning for 6.8 x 3.4 cm abscess, surgery consulted in ED and patient taken directly to OR for I&D - Remains in SDU, anticipate transfer out of SDU today - Overnight febrile to 101.28F (@ 2010), associated tachycardia >100, tachypnea - Currently afebrile, rate controlled - Consult General Surgery - Greatly appreciate recommendations / management - BID wet to dry dressings - Continue Clindamycin IV (6/21>>) - follow-up Blood cultures / Wound cultures - currently NGTD - WBC 14.4, monitor CBC - Pain control - Percocet PRN  # A. fib with RVR - Resolved - Likely transient d/t sepsis, however uncertain if he has arrhythmia at baseline. Pt w/o regular follow-up. No known cardiac history. TSH nml - Overnight converted back to AFib, rate controlled w/o RVR, added Diltiazem PO at that time - Currently AFib, - Continue Metoprolol-XL 50mg  daily PO, Diltiazem 30mg  q 6 hr PO, (discontinued Dilt gtt 6/22) - Consult  Cardiology - re: AFib - greatly appreciate recommendations - 2D ECHO - normal LVEF 55-60%, nml diastolic function, otherwise mostly unremarkable - ASA 81mg  daily  # AKI - Improved - Uncertain of pts baseline. Reports no kidney disease. Likey injury from decreased PO intake. No contrast was given for CT.  - Improving Cr 2.30 >> 1.94 - Continue IVF hydration NS 100 cc / hr, encourage PO intake - avoid nephrotoxic agents - f/u BMET in AM  FEN/GI: Regular diet / NS @ 100 cc/hr Prophylaxis: Heparin SQ after surgery.  Disposition: Pending clinical improvement and surgical recommendations, continue IV antibiotics, surgery following, AFib RVR since converted to NSR, transitioned off Diltiazem gtt to PO rate control, Cardiology following, transfer out of SDU today.  *Patient refusing all further therapy at this time, requesting to leave AMA. Thoroughly discussed our concerns about developing worsening infection, including sepsis, additionally with continued Atrial Fibrillation. Despite discussion, patient adamantly refuses to stay for continued treatment. - Patient removed telemetry and IV, dressed, and ready to leave at 1200. Discussed our recommendation for patient to stay for continued IV antibiotics and monitoring. Patient refused again. Printed off prescriptions including Clindamycin PO (complete 14 day course), Cardizem-24 hr 180mg , and Percocet (#10, 0 refills), recommend daily ASA 81mg .  Subjective:  Improved, still painful, anxious, upset and demanding to leave. Frequent use of vulgar language.  Objective: Temp:  [98 F (36.7 C)-101.9 F (38.8 C)] 98.5 F (36.9 C) (06/23 0510) Pulse Rate:  [61-103] 61 (06/23 0626) Resp:  [22-36] 36 (06/23 0400) BP: (121-142)/(64-117) 142/92 mmHg (06/23 0626) SpO2:  [  92 %-98 %] 94 % (06/23 0626) Weight:  [204 lb 9.4 oz (92.8 kg)] 204 lb 9.4 oz (92.8 kg) (06/23 0400) Physical Exam: General: well-appearing, anxious, and uncomfortable.  NAD Cardiovascular: irregularly irregular mild tachycardia, soft systolic murmur Respiratory: CTAB, nml effort Abdomen: soft, NTND, +active BS Extremities: moves all ext, non-tender, no edema Skin - L-buttock wound improved erythema, clean, packed, less tender. Neuro - AAOx3, grossly non-focal   Laboratory:  Recent Labs Lab 03/22/14 1247 03/23/14 1041 03/24/14 0346  WBC 25.9* 19.6* 14.4*  HGB 16.2 14.5 15.4  HCT 48.3 43.8 45.5  PLT 362 283 260    Recent Labs Lab 03/22/14 1247 03/23/14 1041 03/24/14 0346  NA 135* 133* 137  K 4.2 4.2 4.3  CL 96 96 98  CO2 24 25 24   BUN 25* 35* 32*  CREATININE 2.13* 2.30* 1.94*  CALCIUM 8.8 8.0* 8.5  GLUCOSE 152* 138* 157*    Blood culture (6/21), x 3 >> pending  Wound culture (6/21, L-buttock abscess, I&D in OR) >> pending  MRSA, screen - negative  TSH - 3.560 HgbA1c - 5.1  6/22 UDS - positive BDZ, opiates  Imaging/Diagnostic Tests:  6/21 EKG AFib RVR  6/21 CT Pelvis w/o IMPRESSION:  Focal complex appearing collection adjacent to the gluteal fold  region on the left. This area extends to the tip of the coccyx.  Differential considerations are a phlegmon. Osteomyelitis within the  distal coccyx cannot be excluded. Surgical consultation recommended.  These results were called by telephone at the time of interpretation  on 03/22/2014 at 3:55 PM to the charge nurse of the emergency  department, [place] GROCE, who verbally acknowledged these results.  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  6/22 2D ECHO Study Conclusions - Left ventricle: The cavity size was normal. Wall thickness was normal. Systolic function was normal. The estimated ejection fraction was in the range of 55% to 60%. Left ventricular diastolic function parameters were normal. - Mitral valve: There was mild regurgitation. - Left atrium: The atrium was moderately dilated. - Right atrium: The atrium was mildly dilated.  Saralyn PilarAlexander  Karamalegos, DO 03/24/2014, 7:33 AM PGY-1, [facility] FPTS Intern pager: [phone], text pages welcome

## 2014-03-24 NOTE — Progress Notes (Signed)
agree

## 2014-03-25 LAB — CULTURE, ROUTINE-ABSCESS

## 2014-03-25 LAB — TISSUE CULTURE

## 2014-03-26 NOTE — Discharge Summary (Signed)
FMTS ATTENDING  NOTE Kehinde Eniola,MD I  have seen and examined this patient, reviewed their chart. I have discussed this patient with the resident. I agree with the resident's findings, assessment and care plan. Patient left against medical advise and was not discharged home by [place] since he is still needing medical attention.

## 2014-03-27 LAB — ANAEROBIC CULTURE

## 2014-03-27 NOTE — Discharge Summary (Signed)
FMTS ATTENDING  NOTE Kehinde Eniola,MD I  have seen and examined this patient, reviewed their chart. I have discussed this patient with the resident. I agree with the resident's findings, assessment and care plan. 

## 2014-03-29 LAB — CULTURE, BLOOD (ROUTINE X 2)
CULTURE: NO GROWTH
CULTURE: NO GROWTH

## 2017-07-18 ENCOUNTER — Emergency Department (HOSPITAL_COMMUNITY): Payer: Self-pay

## 2017-07-18 ENCOUNTER — Encounter (HOSPITAL_COMMUNITY): Payer: Self-pay | Admitting: *Deleted

## 2017-07-18 ENCOUNTER — Inpatient Hospital Stay (HOSPITAL_COMMUNITY)
Admission: EM | Admit: 2017-07-18 | Discharge: 2017-07-24 | DRG: 291 | Disposition: A | Discharge status: Home / Self Care | Payer: Self-pay | Attending: Internal Medicine | Admitting: Internal Medicine

## 2017-07-18 DIAGNOSIS — N184 Chronic kidney disease, stage 4 (severe): Secondary | ICD-10-CM | POA: Diagnosis present

## 2017-07-18 DIAGNOSIS — J189 Pneumonia, unspecified organism: Secondary | ICD-10-CM

## 2017-07-18 DIAGNOSIS — R350 Frequency of micturition: Secondary | ICD-10-CM | POA: Diagnosis present

## 2017-07-18 DIAGNOSIS — I481 Persistent atrial fibrillation: Secondary | ICD-10-CM | POA: Diagnosis present

## 2017-07-18 DIAGNOSIS — R0602 Shortness of breath: Secondary | ICD-10-CM

## 2017-07-18 DIAGNOSIS — N179 Acute kidney failure, unspecified: Secondary | ICD-10-CM | POA: Diagnosis present

## 2017-07-18 DIAGNOSIS — I4891 Unspecified atrial fibrillation: Secondary | ICD-10-CM | POA: Diagnosis present

## 2017-07-18 DIAGNOSIS — Z7982 Long term (current) use of aspirin: Secondary | ICD-10-CM

## 2017-07-18 DIAGNOSIS — I130 Hypertensive heart and chronic kidney disease with heart failure and stage 1 through stage 4 chronic kidney disease, or unspecified chronic kidney disease: Principal | ICD-10-CM | POA: Diagnosis present

## 2017-07-18 DIAGNOSIS — M109 Gout, unspecified: Secondary | ICD-10-CM | POA: Diagnosis present

## 2017-07-18 DIAGNOSIS — I420 Dilated cardiomyopathy: Secondary | ICD-10-CM | POA: Diagnosis present

## 2017-07-18 DIAGNOSIS — I509 Heart failure, unspecified: Secondary | ICD-10-CM

## 2017-07-18 DIAGNOSIS — I5043 Acute on chronic combined systolic (congestive) and diastolic (congestive) heart failure: Secondary | ICD-10-CM | POA: Diagnosis present

## 2017-07-18 DIAGNOSIS — R0603 Acute respiratory distress: Secondary | ICD-10-CM

## 2017-07-18 DIAGNOSIS — I480 Paroxysmal atrial fibrillation: Secondary | ICD-10-CM | POA: Diagnosis present

## 2017-07-18 DIAGNOSIS — Z8249 Family history of ischemic heart disease and other diseases of the circulatory system: Secondary | ICD-10-CM

## 2017-07-18 DIAGNOSIS — Z79899 Other long term (current) drug therapy: Secondary | ICD-10-CM

## 2017-07-18 HISTORY — DX: Cardiac arrhythmia, unspecified: I49.9

## 2017-07-18 LAB — BASIC METABOLIC PANEL
ANION GAP: 8 (ref 5–15)
BUN: 30 mg/dL — ABNORMAL HIGH (ref 6–20)
CO2: 25 mmol/L (ref 22–32)
Calcium: 9 mg/dL (ref 8.9–10.3)
Chloride: 108 mmol/L (ref 101–111)
Creatinine, Ser: 2.13 mg/dL — ABNORMAL HIGH (ref 0.61–1.24)
GFR calc Af Amer: 36 mL/min — ABNORMAL LOW (ref >60)
GFR calc non Af Amer: 31 mL/min — ABNORMAL LOW (ref >60)
Glucose, Bld: 136 mg/dL — ABNORMAL HIGH (ref 65–99)
Potassium: 5.2 mmol/L — ABNORMAL HIGH (ref 3.5–5.1)
SODIUM: 141 mmol/L (ref 135–145)

## 2017-07-18 LAB — I-STAT CG4 LACTIC ACID, ED
LACTIC ACID, VENOUS: 1.84 mmol/L (ref 0.5–1.9)
Lactic Acid, Venous: 1.54 mmol/L (ref 0.5–1.9)

## 2017-07-18 LAB — CBC
HCT: 45.7 % (ref 39.0–52.0)
HEMOGLOBIN: 14.8 g/dL (ref 13.0–17.0)
MCH: 28.1 pg (ref 26.0–34.0)
MCHC: 32.4 g/dL (ref 30.0–36.0)
MCV: 86.7 fL (ref 78.0–100.0)
PLATELETS: 411 10*3/uL — AB (ref 150–400)
RBC: 5.27 MIL/uL (ref 4.22–5.81)
RDW: 14.4 % (ref 11.5–15.5)
WBC: 11.2 10*3/uL — ABNORMAL HIGH (ref 4.0–10.5)

## 2017-07-18 LAB — I-STAT TROPONIN, ED: Troponin i, poc: 0.04 ng/mL (ref 0.00–0.08)

## 2017-07-18 LAB — BRAIN NATRIURETIC PEPTIDE: B Natriuretic Peptide: 845.9 pg/mL — ABNORMAL HIGH (ref 0.0–100.0)

## 2017-07-18 LAB — HEPATIC FUNCTION PANEL
ALBUMIN: 3.1 g/dL — AB (ref 3.5–5.0)
ALT: 38 U/L (ref 17–63)
AST: 33 U/L (ref 15–41)
Alkaline Phosphatase: 65 U/L (ref 38–126)
Bilirubin, Direct: <0.1 mg/dL — ABNORMAL LOW (ref 0.1–0.5)
TOTAL PROTEIN: 6.3 g/dL — AB (ref 6.5–8.1)
Total Bilirubin: 0.5 mg/dL (ref 0.3–1.2)

## 2017-07-18 LAB — TSH: TSH: 2.183 u[IU]/mL (ref 0.350–4.500)

## 2017-07-18 LAB — HEPARIN LEVEL (UNFRACTIONATED): Heparin Unfractionated: 0.49 IU/mL (ref 0.30–0.70)

## 2017-07-18 MED ORDER — HEPARIN BOLUS VIA INFUSION
5000.0000 [IU] | Freq: Once | INTRAVENOUS | Status: AC
  Administered 2017-07-18: 5000 [IU] via INTRAVENOUS
  Filled 2017-07-18: qty 5000

## 2017-07-18 MED ORDER — ACETAMINOPHEN 650 MG RE SUPP: 650.0000 mg | Freq: Four times a day (QID) | RECTAL | Status: DC | PRN

## 2017-07-18 MED ORDER — ONDANSETRON HCL 4 MG/2ML IJ SOLN: 4.0000 mg | Freq: Four times a day (QID) | INTRAMUSCULAR | Status: DC | PRN

## 2017-07-18 MED ORDER — DILTIAZEM HCL 25 MG/5ML IV SOLN
15.0000 mg | Freq: Once | INTRAVENOUS | Status: AC
  Administered 2017-07-18: 15 mg via INTRAVENOUS
  Filled 2017-07-18: qty 5

## 2017-07-18 MED ORDER — DILTIAZEM LOAD VIA INFUSION
20.0000 mg | Freq: Once | INTRAVENOUS | Status: AC
  Administered 2017-07-18: 20 mg via INTRAVENOUS
  Filled 2017-07-18: qty 20

## 2017-07-18 MED ORDER — FUROSEMIDE 10 MG/ML IJ SOLN
40.0000 mg | Freq: Two times a day (BID) | INTRAMUSCULAR | Status: DC
  Administered 2017-07-19: 40 mg via INTRAVENOUS
  Filled 2017-07-18: qty 4

## 2017-07-18 MED ORDER — ALLOPURINOL 100 MG PO TABS: 100.0000 mg | ORAL_TABLET | ORAL | Status: DC

## 2017-07-18 MED ORDER — INFLUENZA VAC SPLIT QUAD 0.5 ML IM SUSY: 0.5000 mL | PREFILLED_SYRINGE | INTRAMUSCULAR | Status: DC | PRN

## 2017-07-18 MED ORDER — FUROSEMIDE 10 MG/ML IJ SOLN
20.0000 mg | Freq: Once | INTRAMUSCULAR | Status: AC
  Administered 2017-07-18: 20 mg via INTRAVENOUS
  Filled 2017-07-18: qty 2

## 2017-07-18 MED ORDER — DEXTROSE 5 % IV SOLN
500.0000 mg | Freq: Once | INTRAVENOUS | Status: AC
  Administered 2017-07-18: 500 mg via INTRAVENOUS
  Filled 2017-07-18: qty 500

## 2017-07-18 MED ORDER — DILTIAZEM HCL ER COATED BEADS 180 MG PO CP24
180.0000 mg | ORAL_CAPSULE | Freq: Every day | ORAL | Status: DC
  Administered 2017-07-19 (×2): 180 mg via ORAL
  Filled 2017-07-18 (×2): qty 1

## 2017-07-18 MED ORDER — ACETAMINOPHEN 325 MG PO TABS: 650.0000 mg | ORAL_TABLET | Freq: Four times a day (QID) | ORAL | Status: DC | PRN

## 2017-07-18 MED ORDER — DILTIAZEM HCL 100 MG IV SOLR
5.0000 mg/h | INTRAVENOUS | Status: DC
  Administered 2017-07-18: 5 mg/h via INTRAVENOUS
  Administered 2017-07-18: 7.5 mg/h via INTRAVENOUS
  Filled 2017-07-18 (×2): qty 100

## 2017-07-18 MED ORDER — ASPIRIN 81 MG PO CHEW
324.0000 mg | CHEWABLE_TABLET | Freq: Once | ORAL | Status: AC
  Administered 2017-07-18: 324 mg via ORAL
  Filled 2017-07-18: qty 4

## 2017-07-18 MED ORDER — ONDANSETRON HCL 4 MG PO TABS: 4.0000 mg | ORAL_TABLET | Freq: Four times a day (QID) | ORAL | Status: DC | PRN

## 2017-07-18 MED ORDER — CEFTRIAXONE SODIUM 1 G IJ SOLR
1.0000 g | Freq: Once | INTRAMUSCULAR | Status: AC
  Administered 2017-07-18: 1 g via INTRAVENOUS
  Filled 2017-07-18: qty 10

## 2017-07-18 MED ORDER — HEPARIN (PORCINE) IN NACL 100-0.45 UNIT/ML-% IJ SOLN
1900.0000 [IU]/h | INTRAMUSCULAR | Status: DC
  Administered 2017-07-18 – 2017-07-19 (×2): 1500 [IU]/h via INTRAVENOUS
  Administered 2017-07-21: 1900 [IU]/h via INTRAVENOUS
  Filled 2017-07-18 (×5): qty 250

## 2017-07-18 NOTE — H&P (Addendum)
History and Physical    Jeffery Christian UXN:[id number] DOB: [birth year] DOA: 07/18/2017  PCP: Patient, No Pcp Per  Patient coming from: home.  Chief Complaint: Shortness of breath.  HPI: Jeffery Christian is a 66 y.o. male with previous history of paroxysmal atrial fibrillation in the setting of sepsis in 2015 and since then has not been to a doctor presents to the ER with complaints of shortness of breath.patient's symptoms started 2 weeks ago with shortness of breath which has progressively increased. Patient's shortness of breath to worsens on lying down. Patient states he is unable to lie down last few days. Also has been having some productive cough. Denies any chest pain. Denies any fever or chills. Over the same period of time patient has noticed increasing lower extremity edema. Patient had gone to an urgent care center 2 days ago and was prescribed antibiotics for pneumonia. Despite taking which patient was still short of breath and came to the ER.  ED Course: in the ER patient is in A. Fib with RVR and chest x-ray shows congestion and features concerning for pulmonary edema versus opacities. Patient was placed on Cardizem infusion after bolus was given. Patient also was given Lasix 20 milligrams IV for pulmonary edema. Empirically started on antibiotics for possible pneumonia. Heparin for A. Fib.  Review of Systems: As per HPI, rest all negative.   Past Medical History:  Diagnosis Date  . Dysrhythmia     Past Surgical History:  Procedure Laterality Date  . INCISION AND DRAINAGE ABSCESS Left 03/22/2014   Procedure: INCISION AND DRAINAGE ABSCESS LEFT BUTTOCK ABSCESS;  Surgeon: [name], MD;  Location: [facility] OR;  Service: General;  Laterality: Left;     reports that he has never smoked. He has never used smokeless tobacco. He reports that he does not drink alcohol or use drugs.  No Known Allergies  Family History - Congestive Heart failure Father.  Prior to Admission medications     Medication Sig Start Date End Date Taking? Authorizing Provider  albuterol (PROAIR HFA) 108 (90 Base) MCG/ACT inhaler Inhale 2 puffs into the lungs every 6 (six) hours as needed for wheezing or shortness of breath.   Yes [provider]  allopurinol (ZYLOPRIM) 100 MG tablet Take 100 mg by mouth See admin instructions. AS DIRECTED/AS NEEDED FOR GOUT FLARES   Yes [provider]  aspirin EC 325 MG tablet Take 325 mg by mouth every 8 (eight) hours as needed (for pain or headaches).    Yes [provider]  benzonatate (TESSALON) 100 MG capsule Take 100 mg by mouth 3 (three) times daily as needed for cough.   Yes [provider]  doxycycline (VIBRAMYCIN) 100 MG capsule Take 100 mg by mouth 2 (two) times daily. FOR 10 DAYS 07/16/17 07/25/17 Yes [provider]  Pseudoeph-CPM-DM-APAP (ALKA-SELTZER PLUS-D SINUS/COLD PO) Take 2 tablets by mouth every 6 (six) hours as needed (for symptoms).   Yes [provider]  diltiazem (CARDIZEM CD) 180 MG 24 hr capsule Take 1 capsule (180 mg total) by mouth daily. Patient not taking: Reported on 07/18/2017 03/24/14   Smitty Cords, DO  oxyCODONE-acetaminophen (PERCOCET/ROXICET) 5-325 MG per tablet Take 1-2 tablets by mouth every 6 (six) hours as needed for severe pain. Patient not taking: Reported on 07/18/2017 03/24/14   Smitty Cords, DO    Physical Exam: Vitals:   07/18/17 1830 07/18/17 1845 07/18/17 1900 07/18/17 2038  BP: (!) 118/94 117/79 (!) 105/93 106/84  Pulse: 88  65 81 (!) 109  Resp: 17 (!) 45 (!) 36 (!) 27  Temp:      TempSrc:      SpO2: (!) 88% 95% 95% 94%  Weight:      Height:          Constitutional: moderately built and nourished. Vitals:   07/18/17 1830 07/18/17 1845 07/18/17 1900 07/18/17 2038  BP: (!) 118/94 117/79 (!) 105/93 106/84  Pulse: 88 65 81 (!) 109  Resp: 17 (!) 45 (!) 36 (!) 27  Temp:      TempSrc:      SpO2: (!) 88% 95% 95% 94%  Weight:       Height:       Eyes: anicteric. No pallor. ENMT: no discharge from the ears eyes nose or mouth. Neck: no mass felt. JVD elevated. Respiratory: no rhonchi or crepitations. Cardiovascular: S1 and S2 heard no murmurs appreciated. Abdomen: soft nontender bowel sounds present. No guarding or rigidity. Musculoskeletal: bilateral lower extremity edema extending up to the thighs. Skin:  No rash. Neurologic:alert awake oriented to time place and person. Moves all extremities. Psychiatric: appears normal. Normal affect.   Labs on Admission: I have personally reviewed following labs and imaging studies  CBC:  Recent Labs Lab 07/18/17 1527  WBC 11.2*  HGB 14.8  HCT 45.7  MCV 86.7  PLT 411*   Basic Metabolic Panel:  Recent Labs Lab 07/18/17 1527  NA 141  K 5.2*  CL 108  CO2 25  GLUCOSE 136*  BUN 30*  CREATININE 2.13*  CALCIUM 9.0   GFR: Estimated Creatinine Clearance: 44.2 mL/min (A) (by C-G formula based on SCr of 2.13 mg/dL (H)). Liver Function Tests:  Recent Labs Lab 07/18/17 1527  AST 33  ALT 38  ALKPHOS 65  BILITOT 0.5  PROT 6.3*  ALBUMIN 3.1*   No results for input(s): LIPASE, AMYLASE in the last 168 hours. No results for input(s): AMMONIA in the last 168 hours. Coagulation Profile: No results for input(s): INR, PROTIME in the last 168 hours. Cardiac Enzymes: No results for input(s): CKTOTAL, CKMB, CKMBINDEX, TROPONINI in the last 168 hours. BNP (last 3 results) No results for input(s): PROBNP in the last 8760 hours. HbA1C: No results for input(s): HGBA1C in the last 72 hours. CBG: No results for input(s): GLUCAP in the last 168 hours. Lipid Profile: No results for input(s): CHOL, HDL, LDLCALC, TRIG, CHOLHDL, LDLDIRECT in the last 72 hours. Thyroid Function Tests:  Recent Labs  07/18/17 1629  TSH 2.183   Anemia Panel: No results for input(s): VITAMINB12, FOLATE, FERRITIN, TIBC, IRON, RETICCTPCT in the last 72 hours. Urine analysis: No results  found for: COLORURINE, APPEARANCEUR, LABSPEC, PHURINE, GLUCOSEU, HGBUR, BILIRUBINUR, KETONESUR, PROTEINUR, UROBILINOGEN, NITRITE, LEUKOCYTESUR Sepsis Labs: @LABRCNTIP (procalcitonin:4,lacticidven:4) )No results found for this or any previous visit (from the past 240 hour(s)).   Radiological Exams on Admission: Dg Chest Port 1 View  Result Date: 07/18/2017 CLINICAL DATA:  Cough for 2 weeks EXAM: PORTABLE CHEST 1 VIEW COMPARISON:  None. FINDINGS: Cardiomegaly. Asymmetric streaky opacities in the left mid lung. Small bilateral pleural effusions. No pneumothorax. IMPRESSION: Cardiomegaly and suspected small bilateral pleural effusions with asymmetric left mid lung opacities. This may indicate pneumonia or asymmetric pulmonary edema. Electronically Signed   By: Deatra RobinsonKevin  Herman M.D.   On: 07/18/2017 17:25    EKG: Independently reviewed. A. Fib with RVR.  Assessment/Plan Principal Problem:   Atrial fibrillation with RVR (HCC) Active Problems:   Acute CHF (congestive heart failure) (HCC)  History of gout    1.  A. Fib with RVR - patient has been placed on Cardizem infusion and I have ordered Cardizem CD 180 mg by mouth 1 dose now and daily following which we will try to taper off the  Cardizem infusion. Patient's chads 2 vasc score is 1 for CHF. Patient at this time is on heparin. Will check 2-D echo cardiac markers. TSH was normal. 2. Acute CHF unknown EF - 2-D echo done in 2015 was showing EF of 55-60%. We will recheck 2-D echo. Patient has been placed on Lasix 40 mg IV every 12 may need more dose. Follow metabolic panel intake output and daily weights. Note that patient also has kidney disease. I think patient's symptoms are more consistent with CHF rather than pneumonia. Patient was started on antibiotics but which can be discontinued if procalcitonin levels are negative. 3. Chronic kidney disease stage IV - closely follow intake output metabolic panel. Patient is on Lasix. Urine analysis and FENa  are pending. 4. History of gout on allopurinol which patient has been taking.  I have reviewed patient's old charts and labs. I have requested cardiology consult.   DVT prophylaxis: heparin. Code Status: full code.  Family Communication: discussed with daughter.  Disposition Plan: home.  Consults called: cardiology.  Admission status: inpatient.    Eduard Clos MD [facility] Pager [phone].  If 7PM-7AM, please contact night-coverage [email or web address] Password [email or web address]  07/18/2017, 9:54 PM

## 2017-07-18 NOTE — ED Provider Notes (Signed)
Medical screening examination/treatment/procedure(s) were conducted as a shared visit with non-physician practitioner(s) and myself.  I personally evaluated the patient during the encounter.   EKG Interpretation  Date/Time:  Wednesday July 18 2017 15:24:37 EDT Ventricular Rate:  166 PR Interval:    QRS Duration: 80 QT Interval:  278 QTC Calculation: 461 R Axis:   80 Text Interpretation:  Atrial fibrillation with rapid ventricular response Abnormal QRS-T angle, consider primary T wave abnormality Abnormal ECG no acute ischemic changes. no change from previous Confirmed by Arby Barrette [phone]) on 07/18/2017 4:04:48 PM     recheck post Cardizem bolus and drip: Patient's heart rate was down around 110s. He reports feeling comfortable. Improved shortness of breath. Mental status is alert.Mild increased work of breathing. Recheck 19:10patient still alert and appropriate speaking in full sentences with mild respiratory distress, no deterioration. Heart rate remains atrial fibrillation now rates are up closer to 120s to 130s, will add additional Cardizem bolus 15 mg.  He is findings consistent with congestive heart failure. He has a diffuse vascular congestion on the chest x-ray with some concentration to the left. Also lower extremity edema bilateral and symmetric 2+. He however also started illness with cough and URI symptoms. Will treat for community-acquired pneumonia, this may be the inciting cause of patient's paroxysmal atrial fibrillation. Patient had one episode of atrial fibrillation 3 years ago during admission for sepsis. He was unaware of this and reports he has had no similar episodes or known A. Fib in the interim.  CRITICAL CARE Performed by: Arby Barrette   Total critical care time: 45 minutes  Critical care time was exclusive of separately billable procedures and treating other patients.  Critical care was necessary to treat or prevent imminent or life-threatening  deterioration.  Critical care was time spent personally by me on the following activities: development of treatment plan with patient and/or surrogate as well as nursing, discussions with consultants, evaluation of patient's response to treatment, examination of patient, obtaining history from patient or surrogate, ordering and performing treatments and interventions, ordering and review of laboratory studies, ordering and review of radiographic studies, pulse oximetry and re-evaluation of patient's condition.  Lso: Cardiology Consult: Right hospitalist Dr. Toniann Fail for admission   Arby Barrette, MD 07/18/17 2042

## 2017-07-18 NOTE — ED Provider Notes (Signed)
[facility] PROGRESSIVE CARE Provider Note   CSN: [id number] Arrival date & time: 07/18/17  1515     History   Chief Complaint Chief Complaint  Patient presents with  . Cough  . Shortness of Breath    HPI Jeffery Christian is a 66 y.o. male.  HPI   Jeffery Christian is a 66 year old male with a history of gout, paroxysmal A. Fib and hypertension who presents to the emergency department for evaluation of cough and shortness of breath. Patient states that he has had worsening shortness of breath with non-productive cough for the past 2 weeks now. He endorses tactile fevers. Reports that he went to his primary care doctor 2 days ago and was diagnosed with pneumonia and given antibiotics. He did not get a chest x-ray at that time. Has been taking doxycycline twice a day since that appointment without improvement. Over the past 2 days he notes that he cannot sleep at night because his cough is worsened when he lies flat, also endorses bilateral leg swelling. States that today he feels "jittery" all over. Endorses bilateral lower rib pleuritic chest pain 5/10 in severity and dull/aching in nature. Denies history of previous DVT, no previous cancer history, no history of long periods of immobility. Denies chest pain, abdominal pain, dizziness, nausea/vomiting, weight gain, fatigue, palpitations. Denies alcohol and tobacco use. States that he thinks he was told he had a heart murmur years ago.  Per record, patient was admitted in 2015 with sepsis secondary to left buttock abscess and had paroxysmal afib with RVR. Discharged with diltiazem. Patient does not remember hearing anything about afib and denies taking diltiazem.     Past Medical History:  Diagnosis Date  . Dysrhythmia     Patient Active Problem List   Diagnosis Date Noted  . Atrial fibrillation with RVR (HCC) 07/18/2017  . Acute CHF (congestive heart failure) (HCC) 07/18/2017  . History of gout 07/18/2017  . Left buttock abscess  03/22/2014    Past Surgical History:  Procedure Laterality Date  . INCISION AND DRAINAGE ABSCESS Left 03/22/2014   Procedure: INCISION AND DRAINAGE ABSCESS LEFT BUTTOCK ABSCESS;  Surgeon: [name], MD;  Location: [facility] OR;  Service: General;  Laterality: Left;       Home Medications    Prior to Admission medications   Medication Sig Start Date End Date Taking? Authorizing Provider  albuterol (PROAIR HFA) 108 (90 Base) MCG/ACT inhaler Inhale 2 puffs into the lungs every 6 (six) hours as needed for wheezing or shortness of breath.   Yes [provider]  allopurinol (ZYLOPRIM) 100 MG tablet Take 100 mg by mouth See admin instructions. AS DIRECTED/AS NEEDED FOR GOUT FLARES   Yes [provider]  aspirin EC 325 MG tablet Take 325 mg by mouth every 8 (eight) hours as needed (for pain or headaches).    Yes [provider]  benzonatate (TESSALON) 100 MG capsule Take 100 mg by mouth 3 (three) times daily as needed for cough.   Yes [provider]  doxycycline (VIBRAMYCIN) 100 MG capsule Take 100 mg by mouth 2 (two) times daily. FOR 10 DAYS 07/16/17 07/25/17 Yes [provider]  Pseudoeph-CPM-DM-APAP (ALKA-SELTZER PLUS-D SINUS/COLD PO) Take 2 tablets by mouth every 6 (six) hours as needed (for symptoms).   Yes [provider]    Family History Family History  Problem Relation Age of Onset  . Congestive Heart Failure Father     Social History Social History  Substance Use  Topics  . Smoking status: Never Smoker  . Smokeless tobacco: Never Used  . Alcohol use No     Allergies   Patient has no known allergies.   Review of Systems Review of Systems  Constitutional: Positive for diaphoresis and fever (tactile). Negative for chills, fatigue and unexpected weight change.  HENT: Negative for rhinorrhea, sinus pressure and sore throat.   Respiratory: Positive for cough and shortness of breath. Negative for wheezing and stridor.     Cardiovascular: Positive for leg swelling (bilateral). Negative for chest pain and palpitations.  Gastrointestinal: Negative for abdominal pain, diarrhea, nausea and vomiting.  Genitourinary: Negative for difficulty urinating.  Musculoskeletal: Negative for back pain and gait problem.  Skin: Negative for rash.  Neurological: Negative for dizziness, weakness, light-headedness and numbness.  Psychiatric/Behavioral: Negative for agitation.     Physical Exam Updated Vital Signs BP (!) 123/96 (BP Location: Right Arm)   Pulse 62   Temp 97.6 F (36.4 C) (Oral)   Resp (!) 24   Ht 6' (1.829 m)   Wt 108.3 kg (238 lb 12.1 oz)   SpO2 98%   BMI 32.38 kg/m   Physical Exam  Constitutional: He is oriented to person, place, and time. He appears well-developed and well-nourished.  HENT:  Head: Normocephalic and atraumatic.  Mouth/Throat: Oropharynx is clear and moist.  Eyes: Pupils are equal, round, and reactive to light. Conjunctivae are normal. Right eye exhibits no discharge. Left eye exhibits no discharge.  Neck: Normal range of motion. Neck supple. JVD present.  Cardiovascular: Intact distal pulses.  Exam reveals no friction rub.   No murmur heard. Irregularly irregular rate, tachycardic  Pulmonary/Chest: Effort normal. No respiratory distress. He has no rales. He exhibits no tenderness.  Tachypnic. Mild expiratory wheezing in upper lung fields bilaterally.     Abdominal: Soft. Bowel sounds are normal. He exhibits no distension. There is no tenderness. There is no rebound and no guarding.  Musculoskeletal: Normal range of motion.  Lower extremities with bilateral 2+ pitting edema up to the knee. No erythema, bruising or rash noted. Full ROM of ankles, knees bilaterally. PT pulses 1+ bilaterally.  Lymphadenopathy:    He has no cervical adenopathy.  Neurological: He is alert and oriented to person, place, and time. Coordination normal.  Sensation intact to sharp/light touch in bilateral  lower extremities.   Skin: Skin is warm and dry. Capillary refill takes less than 2 seconds.  Psychiatric: He has a normal mood and affect. His behavior is normal.     ED Treatments / Results  Labs (all labs ordered are listed, but only abnormal results are displayed) Labs Reviewed  BASIC METABOLIC PANEL - Abnormal; Notable for the following:       Result Value   Potassium 5.2 (*)    Glucose, Bld 136 (*)    BUN 30 (*)    Creatinine, Ser 2.13 (*)    GFR calc non Af Amer 31 (*)    GFR calc Af Amer 36 (*)    All other components within normal limits  CBC - Abnormal; Notable for the following:    WBC 11.2 (*)    Platelets 411 (*)    All other components within normal limits  BRAIN NATRIURETIC PEPTIDE - Abnormal; Notable for the following:    B Natriuretic Peptide 845.9 (*)    All other components within normal limits  HEPATIC FUNCTION PANEL - Abnormal; Notable for the following:    Total Protein 6.3 (*)    Albumin  3.1 (*)    Bilirubin, Direct <0.1 (*)    All other components within normal limits  TROPONIN I - Abnormal; Notable for the following:    Troponin I 0.03 (*)    All other components within normal limits  CULTURE, BLOOD (ROUTINE X 2)  CULTURE, BLOOD (ROUTINE X 2)  MRSA PCR SCREENING  TSH  HEPARIN LEVEL (UNFRACTIONATED)  MAGNESIUM  PROCALCITONIN  CBC  TROPONIN I  TROPONIN I  HIV ANTIBODY (ROUTINE TESTING)  BASIC METABOLIC PANEL  HEPARIN LEVEL (UNFRACTIONATED)  I-STAT TROPONIN, ED  I-STAT CG4 LACTIC ACID, ED  I-STAT CG4 LACTIC ACID, ED    EKG  EKG Interpretation  Date/Time:  Wednesday July 18 2017 15:24:37 EDT Ventricular Rate:  166 PR Interval:    QRS Duration: 80 QT Interval:  278 QTC Calculation: 461 R Axis:   80 Text Interpretation:  Atrial fibrillation with rapid ventricular response Abnormal QRS-T angle, consider primary T wave abnormality Abnormal ECG no acute ischemic changes. no change from previous Confirmed by Arby Barrette [phone]) on  07/18/2017 4:04:48 PM       Radiology Dg Chest Port 1 View  Result Date: 07/18/2017 CLINICAL DATA:  Cough for 2 weeks EXAM: PORTABLE CHEST 1 VIEW COMPARISON:  None. FINDINGS: Cardiomegaly. Asymmetric streaky opacities in the left mid lung. Small bilateral pleural effusions. No pneumothorax. IMPRESSION: Cardiomegaly and suspected small bilateral pleural effusions with asymmetric left mid lung opacities. This may indicate pneumonia or asymmetric pulmonary edema. Electronically Signed   By: Deatra Robinson M.D.   On: 07/18/2017 17:25    Procedures Procedures (including critical care time)  Medications Ordered in ED Medications  diltiazem (CARDIZEM) 1 mg/mL load via infusion 20 mg (20 mg Intravenous Bolus from Bag 07/18/17 1659)    And  diltiazem (CARDIZEM) 100 mg in dextrose 5 % 100 mL (1 mg/mL) infusion (5 mg/hr Intravenous Rate/Dose Change 07/19/17 0037)  heparin ADULT infusion 100 units/mL (25000 units/29mL sodium chloride 0.45%) (1,500 Units/hr Intravenous New Bag/Given 07/18/17 1729)  diltiazem (CARDIZEM CD) 24 hr capsule 180 mg (180 mg Oral Given 07/19/17 0022)  furosemide (LASIX) injection 40 mg (not administered)  acetaminophen (TYLENOL) tablet 650 mg (not administered)    Or  acetaminophen (TYLENOL) suppository 650 mg (not administered)  ondansetron (ZOFRAN) tablet 4 mg (not administered)    Or  ondansetron (ZOFRAN) injection 4 mg (not administered)  Influenza vac split quadrivalent PF (FLUARIX) injection 0.5 mL (not administered)  aspirin chewable tablet 324 mg (324 mg Oral Given 07/18/17 1725)  heparin bolus via infusion 5,000 Units (5,000 Units Intravenous Bolus from Bag 07/18/17 1730)  furosemide (LASIX) injection 20 mg (20 mg Intravenous Given 07/18/17 1927)  cefTRIAXone (ROCEPHIN) 1 g in dextrose 5 % 50 mL IVPB (0 g Intravenous Stopped 07/18/17 2159)  azithromycin (ZITHROMAX) 500 mg in dextrose 5 % 250 mL IVPB (0 mg Intravenous Stopped 07/18/17 2159)  diltiazem  (CARDIZEM) injection 15 mg (15 mg Intravenous Given 07/18/17 2031)     Initial Impression / Assessment and Plan / ED Course  I have reviewed the triage vital signs and the nursing notes.  Pertinent labs & imaging results that were available during my care of the patient were reviewed by me and considered in my medical decision making (see chart for details).  Clinical Course as of Jul 20 127  Wed Jul 18, 2017  1629 Creatinine: (!) 2.13 [ES]  1656 Patient in Afib with RVR, started on diltiazem IV bolus and drip. Chad2Vasc score 1, started on heparin drip.   [  ES]  1722 Heart rate 111 after being started on Cardizem drip, patient's Xray with vascular congestion. Will start on 20 IV Lasix.   [ES]  1904 Given potential left sided pneumonia on xray, given IV ceftriaxone and zithromax  [ES]    Clinical Course User Index [ES] Kellie Shropshire, PA-C   Patient presents with worsening SOB, cough and bilateral leg swelling for the past two weeks. Found to be in Afib with RVR, Chad2Vasc score 1. Started on Cardizem and heparin drip. His CXR reveals mild bilateral pleural effusions with left lower lung field opacity. Lower lung opacity is potentially pneumonia, patient also has a mild leukocytosis (WBC 11.2). Started on IV ceftriaxone and azithromycin for CAP.  He also has an elevated Creatinine of 2.13. No recent baseline, 3 years ago creatinine was elevated at 1.94. Potassium is also elevated at 5.2. Will hold off on fluids for potential AKI given patient has vascular congestion on xray and is third spacing fluid in his LE. BNP is elevated at 845.9, unknown baseline.    Hospitalist service will admit patient for new onset Afib with RVR and potential pneumonia.   This patients CHA2DS2-VASc Score and unadjusted Ischemic Stroke Rate (% per year) is equal to 0.6 % stroke rate/year from a score of 1  Above score calculated as 1 point each if present [CHF, HTN, DM, Vascular=MI/PAD/Aortic Plaque, Age if  65-74, or Male] Above score calculated as 2 points each if present [Age > 75, or Stroke/TIA/TE]    Final Clinical Impressions(s) / ED Diagnoses   Final diagnoses:  Atrial fibrillation with rapid ventricular response (HCC)  Acute respiratory distress  Acute congestive heart failure, unspecified heart failure type (HCC)  Community acquired pneumonia of left lung, unspecified part of lung    New Prescriptions Current Discharge Medication List       Jeffery Christian 07/19/17 0128    Arby Barrette, MD 07/23/17 [phone]

## 2017-07-18 NOTE — Progress Notes (Signed)
ANTICOAGULATION CONSULT NOTE - Initial Consult  Pharmacy Consult for heparin dosing Indication: atrial fibrillation  No Known Allergies  Patient Measurements:   Heparin Dosing Weight: 99.9   Vital Signs: Temp: 97.4 F (36.3 C) (10/17 1527) Temp Source: Oral (10/17 1527) BP: 136/125 (10/17 1557) Pulse Rate: 77 (10/17 1557)  Labs:  Recent Labs  07/18/17 1527  HGB 14.8  HCT 45.7  PLT 411*  CREATININE 2.13*    CrCl cannot be calculated (Unknown ideal weight.).   Medical History: History reviewed. No pertinent past medical history.  Medications:  Scheduled:  . aspirin  324 mg Oral Once  . diltiazem  20 mg Intravenous Once    Assessment: 65 YOM admitted with cough and SOB. HGB 14.8, HCT 45.7, Plt 411. No anticoag PTA. No bleeding reported.  On diltiazem PTA.  Goal of Therapy:  Heparin level 0.3-0.7 units/ml Monitor platelets by anticoagulation protocol: Yes   Plan:  Give 5000 units bolus x 1 Start heparin infusion at 1500 units/hr  6 hour heparin level, daily heparin level and CBC Monitor s/sx of bleeding.   Emeline General, PharmD Candidate 07/18/2017,4:20 PM

## 2017-07-18 NOTE — Consult Note (Signed)
Cardiology Consultation Note    Patient ID: Jeffery Christian, MRN: [id number], DOB/AGE: 1951/05/06 66 y.o. Admit date: 07/18/2017   Date of Consult: 07/18/2017 Primary Physician: Patient, No Pcp Per Primary Cardiologist: Dr. Tenny Craw  Reason for Consultation: AF, CHF Requesting MD: Dr. Donnald Garre  HPI: Jeffery Christian is a 66 y.o. male with a history of pAF (not on A/C), CKD, gout, who presents with SOB.  Pt has noted issues with SOB and lower extremity edema for the past 2 weeks.  He has had significant PND and orthopnea, having to sit upright in a recliner.  He denies CP, palpitations, presyncope, syncope, bleeding issues or neurologic symptoms.  Pt was evaluated by PCP and was started on PO antibiotics for presumed CAP.  Given lack of improvement in symptoms, he presented to the ED for evaluation.  He was noted to be in AF with RVR (rates up to 160s) and normotensive. ECG showed no ischemic changes.  CXR with bilateral pleural effusions with prominent opacities in left mid lung.  He was started on diltiazem gtt with improvement in Hrs to high 90s-low 100s.  Pt was given IV Lasix with robust urine output.  He was started on heparin gtt for anticoagulation and given CTX/azithro for treatment of CAP.  Upon my interview, pt reported improved SOB, and denied CP or palpitations.  History reviewed. No pertinent past medical history.    Surgical History:  Past Surgical History:  Procedure Laterality Date  . INCISION AND DRAINAGE ABSCESS Left 03/22/2014   Procedure: INCISION AND DRAINAGE ABSCESS LEFT BUTTOCK ABSCESS;  Surgeon: [name], MD;  Location: [facility] OR;  Service: General;  Laterality: Left;     Home Meds: Prior to Admission medications   Medication Sig Start Date End Date Taking? Authorizing Provider  albuterol (PROAIR HFA) 108 (90 Base) MCG/ACT inhaler Inhale 2 puffs into the lungs every 6 (six) hours as needed for wheezing or shortness of breath.   Yes [provider]  allopurinol  (ZYLOPRIM) 100 MG tablet Take 100 mg by mouth See admin instructions. AS DIRECTED/AS NEEDED FOR GOUT FLARES   Yes [provider]  aspirin EC 325 MG tablet Take 325 mg by mouth every 8 (eight) hours as needed (for pain or headaches).    Yes [provider]  benzonatate (TESSALON) 100 MG capsule Take 100 mg by mouth 3 (three) times daily as needed for cough.   Yes [provider]  doxycycline (VIBRAMYCIN) 100 MG capsule Take 100 mg by mouth 2 (two) times daily. FOR 10 DAYS 07/16/17 07/25/17 Yes [provider]  Pseudoeph-CPM-DM-APAP (ALKA-SELTZER PLUS-D SINUS/COLD PO) Take 2 tablets by mouth every 6 (six) hours as needed (for symptoms).   Yes [provider]  diltiazem (CARDIZEM CD) 180 MG 24 hr capsule Take 1 capsule (180 mg total) by mouth daily. Patient not taking: Reported on 07/18/2017 03/24/14   Smitty Cords, DO  oxyCODONE-acetaminophen (PERCOCET/ROXICET) 5-325 MG per tablet Take 1-2 tablets by mouth every 6 (six) hours as needed for severe pain. Patient not taking: Reported on 07/18/2017 03/24/14   Smitty Cords, DO    Inpatient Medications:   . diltiazem (CARDIZEM) infusion Stopped (07/18/17 2118)  . heparin 1,500 Units/hr (07/18/17 1729)    Allergies: No Known Allergies  Social History   Social History  . Marital status: Divorced    Spouse name: N/A  . Number of children: N/A  . Years of education: N/A   Occupational History  . Not on file.  Social History Main Topics  . Smoking status: Never Smoker  . Smokeless tobacco: Not on file  . Alcohol use No  . Drug use: No  . Sexual activity: Not on file   Other Topics Concern  . Not on file   Social History Narrative  . No narrative on file     History reviewed. No pertinent family history.   Review of Systems All other systems reviewed and are otherwise negative except as noted above.  Labs: No results for input(s): CKTOTAL, CKMB, TROPONINI in the  last 72 hours. Lab Results  Component Value Date   WBC 11.2 (H) 07/18/2017   HGB 14.8 07/18/2017   HCT 45.7 07/18/2017   MCV 86.7 07/18/2017   PLT 411 (H) 07/18/2017    Recent Labs Lab 07/18/17 1527  NA 141  K 5.2*  CL 108  CO2 25  BUN 30*  CREATININE 2.13*  CALCIUM 9.0  PROT 6.3*  BILITOT 0.5  ALKPHOS 65  ALT 38  AST 33  GLUCOSE 136*   No results found for: CHOL, HDL, LDLCALC, TRIG No results found for: DDIMER  Radiology/Studies:  Dg Chest Port 1 View  Result Date: 07/18/2017 CLINICAL DATA:  Cough for 2 weeks EXAM: PORTABLE CHEST 1 VIEW COMPARISON:  None. FINDINGS: Cardiomegaly. Asymmetric streaky opacities in the left mid lung. Small bilateral pleural effusions. No pneumothorax. IMPRESSION: Cardiomegaly and suspected small bilateral pleural effusions with asymmetric left mid lung opacities. This may indicate pneumonia or asymmetric pulmonary edema. Electronically Signed   By: Deatra RobinsonKevin  Herman M.D.   On: 07/18/2017 17:25    Wt Readings from Last 3 Encounters:  07/18/17 106.6 kg (235 lb)  03/24/14 92.8 kg (204 lb 9.4 oz)    EKG: AF, rate 166.  Physical Exam: Blood pressure 106/84, pulse (!) 109, temperature (!) 97.4 F (36.3 C), temperature source Oral, resp. rate (!) 27, height 6' (1.829 m), weight 106.6 kg (235 lb), SpO2 94 %. Body mass index is 31.87 kg/m. General: Well developed, well nourished, mildly tachypneic. Head: Normocephalic, atraumatic, sclera non-icteric, no xanthomas, nares are without discharge.  Neck: JVP 13-14 Lungs: Coarse bs,throughout, mildly diminished at the bases Heart: Tachycardic, irregular, no m/r/g Abdomen: Soft, non-tender, non-distended with normoactive bowel sounds. No hepatomegaly. No rebound/guarding. No obvious abdominal masses. Msk:  Strength and tone appear normal for age. Extremities: WWP, 2+ b/l LE edema. Neuro: Alert and oriented X 3. No facial asymmetry. No focal deficit. Moves all extremities spontaneously. Psych:   Responds to questions appropriately with a normal affect.     Assessment and Plan   66 y.o. male with a history of pAF (not on A/C), CKD, gout, who presents with SOB, found to be in AF with RVR with significant volume overload on exam.  It is possible that he may have an underlying pneumonia as well that may be the trigger. For AF, agree with diltiazem gtt for now; at present, rates are adequately controlled with acceptable BP.  If he becomes hypotensive on diltiazem, would discontinue and start amiodarone gtt.  Continue heparin gtt for anticoagulation, could consider transition to oral agent (likely warfarin due to insurance issues).  Would continue diuresis with goal net negative 1-2 L tomorrow.  Would hold off on TEE/DCCV until volume status is under better control.  Plan to repeat TTE when rates are well controlled.  Signed, Esmond PlantsJaidip Naithan Delage, MD 07/18/2017, 9:41 PM

## 2017-07-18 NOTE — ED Triage Notes (Signed)
Pt reports having a productive cough x 2 weeks. Went to pcp and treated with meds for possible pneumonia. No relief with meds. Reports unable to sleep due to the cough and sob. Reports pain to his ribs, has been sitting up on edge of bed and has swelling to bilateral legs.

## 2017-07-19 ENCOUNTER — Inpatient Hospital Stay (HOSPITAL_COMMUNITY): Payer: Self-pay

## 2017-07-19 DIAGNOSIS — I5021 Acute systolic (congestive) heart failure: Secondary | ICD-10-CM

## 2017-07-19 DIAGNOSIS — Z8739 Personal history of other diseases of the musculoskeletal system and connective tissue: Secondary | ICD-10-CM

## 2017-07-19 DIAGNOSIS — I4891 Unspecified atrial fibrillation: Secondary | ICD-10-CM

## 2017-07-19 DIAGNOSIS — I340 Nonrheumatic mitral (valve) insufficiency: Secondary | ICD-10-CM

## 2017-07-19 LAB — BASIC METABOLIC PANEL
ANION GAP: 12 (ref 5–15)
BUN: 35 mg/dL — ABNORMAL HIGH (ref 6–20)
CALCIUM: 8.9 mg/dL (ref 8.9–10.3)
CO2: 22 mmol/L (ref 22–32)
CREATININE: 2.15 mg/dL — AB (ref 0.61–1.24)
Chloride: 108 mmol/L (ref 101–111)
GFR, EST AFRICAN AMERICAN: 36 mL/min — AB (ref >60)
GFR, EST NON AFRICAN AMERICAN: 31 mL/min — AB (ref >60)
GLUCOSE: 90 mg/dL (ref 65–99)
Potassium: 4.3 mmol/L (ref 3.5–5.1)
Sodium: 142 mmol/L (ref 135–145)

## 2017-07-19 LAB — SODIUM, URINE, RANDOM: SODIUM UR: 116 mmol/L

## 2017-07-19 LAB — URINALYSIS, ROUTINE W REFLEX MICROSCOPIC
Bilirubin Urine: NEGATIVE
Glucose, UA: NEGATIVE mg/dL
Hgb urine dipstick: NEGATIVE
KETONES UR: NEGATIVE mg/dL
LEUKOCYTES UA: NEGATIVE
NITRITE: NEGATIVE
PROTEIN: NEGATIVE mg/dL
Specific Gravity, Urine: 1.005 (ref 1.005–1.030)
pH: 5 (ref 5.0–8.0)

## 2017-07-19 LAB — MRSA PCR SCREENING: MRSA BY PCR: NEGATIVE

## 2017-07-19 LAB — ECHOCARDIOGRAM COMPLETE
Height: 72 in
WEIGHTICAEL: 3823.66 [oz_av]

## 2017-07-19 LAB — CBC
HCT: 45.6 % (ref 39.0–52.0)
Hemoglobin: 15.1 g/dL (ref 13.0–17.0)
MCH: 28.6 pg (ref 26.0–34.0)
MCHC: 33.1 g/dL (ref 30.0–36.0)
MCV: 86.4 fL (ref 78.0–100.0)
PLATELETS: 370 10*3/uL (ref 150–400)
RBC: 5.28 MIL/uL (ref 4.22–5.81)
RDW: 14.2 % (ref 11.5–15.5)
WBC: 10.9 10*3/uL — AB (ref 4.0–10.5)

## 2017-07-19 LAB — TROPONIN I
TROPONIN I: 0.03 ng/mL — AB (ref <0.03)
Troponin I: 0.03 ng/mL (ref <0.03)

## 2017-07-19 LAB — MAGNESIUM: Magnesium: 2.1 mg/dL (ref 1.7–2.4)

## 2017-07-19 LAB — PROCALCITONIN: Procalcitonin: <0.1 ng/mL

## 2017-07-19 LAB — HIV ANTIBODY (ROUTINE TESTING W REFLEX): HIV SCREEN 4TH GENERATION: NONREACTIVE

## 2017-07-19 LAB — HEPARIN LEVEL (UNFRACTIONATED): HEPARIN UNFRACTIONATED: 0.41 [IU]/mL (ref 0.30–0.70)

## 2017-07-19 LAB — CREATININE, URINE, RANDOM: CREATININE, URINE: 22.8 mg/dL

## 2017-07-19 MED ORDER — MENTHOL 3 MG MT LOZG
1.0000 | LOZENGE | OROMUCOSAL | Status: DC | PRN
  Administered 2017-07-19: 3 mg via ORAL
  Filled 2017-07-19: qty 9

## 2017-07-19 MED ORDER — FUROSEMIDE 10 MG/ML IJ SOLN
80.0000 mg | Freq: Once | INTRAMUSCULAR | Status: AC
  Administered 2017-07-19: 80 mg via INTRAVENOUS
  Filled 2017-07-19: qty 8

## 2017-07-19 MED ORDER — IPRATROPIUM-ALBUTEROL 0.5-2.5 (3) MG/3ML IN SOLN
3.0000 mL | Freq: Four times a day (QID) | RESPIRATORY_TRACT | Status: DC | PRN
  Administered 2017-07-19: 3 mL via RESPIRATORY_TRACT
  Filled 2017-07-19: qty 3

## 2017-07-19 MED ORDER — FUROSEMIDE 10 MG/ML IJ SOLN
60.0000 mg | Freq: Two times a day (BID) | INTRAMUSCULAR | Status: DC
  Administered 2017-07-19 – 2017-07-20 (×2): 60 mg via INTRAVENOUS
  Filled 2017-07-19 (×2): qty 6

## 2017-07-19 MED ORDER — FUROSEMIDE 10 MG/ML IJ SOLN
20.0000 mg | Freq: Once | INTRAMUSCULAR | Status: AC
  Administered 2017-07-19: 20 mg via INTRAVENOUS
  Filled 2017-07-19: qty 2

## 2017-07-19 MED ORDER — METOPROLOL SUCCINATE ER 50 MG PO TB24
50.0000 mg | ORAL_TABLET | Freq: Every day | ORAL | Status: DC
  Administered 2017-07-19 – 2017-07-21 (×3): 50 mg via ORAL
  Filled 2017-07-19 (×3): qty 1

## 2017-07-19 MED ORDER — TRAZODONE HCL 50 MG PO TABS
50.0000 mg | ORAL_TABLET | Freq: Once | ORAL | Status: AC
  Administered 2017-07-19: 50 mg via ORAL
  Filled 2017-07-19: qty 1

## 2017-07-19 NOTE — Progress Notes (Signed)
ANTICOAGULATION CONSULT NOTE - F/u Consult  Pharmacy Consult for heparin dosing Indication: atrial fibrillation  No Known Allergies  Patient Measurements: Height: 6' (182.9 cm) Weight: 238 lb 12.1 oz (108.3 kg) IBW/kg (Calculated) : 77.6 Heparin Dosing Weight: 99.9   Vital Signs: Temp: 97.6 F (36.4 C) (10/17 2256) Temp Source: Oral (10/17 2256) BP: 123/96 (10/17 2256) Pulse Rate: 62 (10/17 2256)  Labs:  Recent Labs  07/18/17 1527 07/18/17 2309  HGB 14.8  --   HCT 45.7  --   PLT 411*  --   HEPARINUNFRC  --  0.49  CREATININE 2.13*  --   TROPONINI  --  0.03*    Estimated Creatinine Clearance: 44.6 mL/min (A) (by C-G formula based on SCr of 2.13 mg/dL (H)).   Medical History: Past Medical History:  Diagnosis Date  . Dysrhythmia     Medications:  Scheduled:  . diltiazem  180 mg Oral Daily  . furosemide  40 mg Intravenous Q12H   Assessment: 65 YOM admitted with SOB. History of afib, not on anticoagulation PTA. Pharmacy consulted to dose heparin.   Heparin level therapeutic at 0.49. No new CBC, no s/s bleeding documented.   Goal of Therapy:  Heparin level 0.3-0.7 units/ml Monitor platelets by anticoagulation protocol: Yes   Plan:  Continue heparin gtt at 1500 units/hr  Confirm heparin level with AM labs Daily heparin level and CBC Monitor for s/s bleeding F/u long-term AC plan   Einar Crow, PharmD Clinical Pharmacist 07/19/17 12:25 AM

## 2017-07-19 NOTE — Progress Notes (Signed)
CRITICAL VALUE ALERT  Critical Value:  0.03 Troponin    Date & Time Notied: 07/19/2017 0030   Provider Notified: Bruna Potter  Orders Received/Actions taken: No new order received at this time, will continue to monitor.

## 2017-07-19 NOTE — Care Management Note (Signed)
Case Management Note  Patient Details  Name: Vashawn Worch MRN: [id number] Date of Birth: 1951/05/04  Subjective/Objective:       Pt admitted with dyspnea - pt found to be in paroxysmal A fib             Action/Plan:  PTA from home independent.  Pt will discharge home on Xarelto - CM provided free 30 day card.  Pt does not have active insurance nor prescription coverage.  CM provided [facility] and [facility] clinic information on AVS - requested pt contact clinics Monday am and request post discharge from hospital appt.     Expected Discharge Date:                  Expected Discharge Plan:  Home/Self Care  In-House Referral:     Discharge planning Services  CM Consult, Medication Assistance  Post Acute Care Choice:    Choice offered to:     DME Arranged:    DME Agency:     HH Arranged:    HH Agency:     Status of Service:  In process, will continue to follow  If discussed at Long Length of Stay Meetings, dates discussed:    Additional Comments:  Cherylann Parr, RN 07/19/2017, 4:05 PM

## 2017-07-19 NOTE — Progress Notes (Signed)
  Echocardiogram 2D Echocardiogram has been performed.  Celene Skeen 07/19/2017, 10:11 AM

## 2017-07-19 NOTE — Progress Notes (Signed)
PROGRESS NOTE    Jeffery Christian  XNA:[id number] DOB: 31-Jul-1951 DOA: 07/18/2017 PCP: Patient, No Pcp Per    Brief Narrative:  66 year old male who presented with dyspnea. Patient does have the significant past medical history for paroxysmal atrial fibrillation. Patient had progressive symptoms for last 2 weeks, more severe over last 48 hours, reporting orthopnea and lower extremity edema.on his initial physical examination, blood pressure 118/94, heart rate 151-169, respiratory rate 27-45, oxygen saturation 88-95%. Moist mucous membranes, lungs clear to auscultation bilaterally, heart S1-S2 present irregularly irregular, no murmurs, rubs or gallops, the abdomen was soft nontender, trace lower extremity edema. Sodium 141, potassium 5.2, chloride 108, bicarbonate 25, glucose 136, BUN 30, creatinine 2.13, white count 11.2, hemoglobin14.8, hematocrit 45.7, platelets 411. Chest x-ray, left rotation, hypoinflation, increase interstitial markings bilaterally more prominent left hilar region, moderate cardiomegaly. EKG with atrial fibrillation, rate 166/m, positive PVC.  Patient admitted to the hospital working diagnosis of atrial fibrillation with rapid ventricular response.    Assessment & Plan:   Principal Problem:   Atrial fibrillation with RVR (HCC) Active Problems:   Acute CHF (congestive heart failure) (HCC)   History of gout   1. Atrial fibrillation with rapid ventricular response. Patient placed on diltiazem IV for rate control, started on metoprolol succinate 50 mg, will continue anticoagulation with IV heparin. Continue telemetry monitoring, check ekg this am. Hear rate has been less than 115 since this am.   2. Acute cardiogenic pulmonary edema due to acute on chronic diastolic heart failure decompensation. Patient with frank volume overload this am, will need more aggressive diuresis, will increase furosemide to 60 mg bid and will order strict in and out, plus daily weight. Will target  negative fluid balance. Follow on echocardiography.   3. Chronic kidney disease stage IV. Renal function with serum cr at 2.15, will need high doses of furosemide for diuresis, K at 4,3 and serum bicarbonate at 22, will follow on renal panel in am, avoid hypotension or nephrotoxic medications.   4. History of gout. Not in exacerbation, will stay vigilant for joint pain, furosemide can be associated with hyperuricemia.    DVT prophylaxis: heparin  Code Status: full Family Communication:  Disposition Plan:    Consultants:   Cardiology   Procedures:     Antimicrobials:       Subjective: Patient with persistent dyspnea and palpitations. Dyspnea is moderate in intensity, worse with supine, associated with palpitations and lower extremity edema, no nausea or vomiting.   Objective: Vitals:   07/19/17 0530 07/19/17 0600 07/19/17 0732 07/19/17 0800  BP: (!) 156/144 127/89 (!) 124/99 119/83  Pulse: 96 73 72 (!) 38  Resp: (!) 24 14 (!) 31 (!) 36  Temp:   (!) 97.4 F (36.3 C)   TempSrc:   Oral   SpO2: 95% (!) 84%  93%  Weight:      Height:        Intake/Output Summary (Last 24 hours) at 07/19/17 0858 Last data filed at 07/19/17 0400  Gross per 24 hour  Intake           669.67 ml  Output               50 ml  Net           619.67 ml   Filed Weights   07/18/17 1600 07/18/17 2256 07/19/17 0440  Weight: 106.6 kg (235 lb) 108.3 kg (238 lb 12.1 oz) 108.4 kg (238 lb 15.7 oz)    Examination:  General: deconditioned and ill looking appearing Neurology: Awake and alert, non focal  E ENT: mild pallor, no icterus, oral mucosa moist Cardiovascular: Positive moderate  JVD. S1-S2 present, irregulary irregualr, no gallops, rubs, or murmurs. ++ pitting lower extremity edema. Pulmonary: decreased breath sounds bilaterally, adequate air movement, no wheezing, rhonchi, scatered rales. Gastrointestinal. Abdomen protuberant, no organomegaly, non tender, no rebound or guarding Skin. No  rashes Musculoskeletal: no joint deformities     Data Reviewed: I have personally reviewed following labs and imaging studies  CBC:  Recent Labs Lab 07/18/17 1527 07/19/17 0425  WBC 11.2* 10.9*  HGB 14.8 15.1  HCT 45.7 45.6  MCV 86.7 86.4  PLT 411* 370   Basic Metabolic Panel:  Recent Labs Lab 07/18/17 1527 07/18/17 2309 07/19/17 0425  NA 141  --  142  K 5.2*  --  4.3  CL 108  --  108  CO2 25  --  22  GLUCOSE 136*  --  90  BUN 30*  --  35*  CREATININE 2.13*  --  2.15*  CALCIUM 9.0  --  8.9  MG  --  2.1  --    GFR: Estimated Creatinine Clearance: 44.1 mL/min (A) (by C-G formula based on SCr of 2.15 mg/dL (H)). Liver Function Tests:  Recent Labs Lab 07/18/17 1527  AST 33  ALT 38  ALKPHOS 65  BILITOT 0.5  PROT 6.3*  ALBUMIN 3.1*   No results for input(s): LIPASE, AMYLASE in the last 168 hours. No results for input(s): AMMONIA in the last 168 hours. Coagulation Profile: No results for input(s): INR, PROTIME in the last 168 hours. Cardiac Enzymes:  Recent Labs Lab 07/18/17 2309 07/19/17 0425  TROPONINI 0.03* 0.03*   BNP (last 3 results) No results for input(s): PROBNP in the last 8760 hours. HbA1C: No results for input(s): HGBA1C in the last 72 hours. CBG: No results for input(s): GLUCAP in the last 168 hours. Lipid Profile: No results for input(s): CHOL, HDL, LDLCALC, TRIG, CHOLHDL, LDLDIRECT in the last 72 hours. Thyroid Function Tests:  Recent Labs  07/18/17 1629  TSH 2.183   Anemia Panel: No results for input(s): VITAMINB12, FOLATE, FERRITIN, TIBC, IRON, RETICCTPCT in the last 72 hours.    Radiology Studies: I have reviewed all of the imaging during this hospital visit personally     Scheduled Meds: . diltiazem  180 mg Oral Daily  . furosemide  40 mg Intravenous Q12H   Continuous Infusions: . diltiazem (CARDIZEM) infusion 5 mg/hr (07/19/17 0037)  . heparin 1,500 Units/hr (07/19/17 0617)     LOS: 1 day         Bernestine Holsapple Annett Gulaaniel Sherine Cortese, MD [facility] Pager [phone]

## 2017-07-19 NOTE — Progress Notes (Signed)
Paged dr Ella Jubilee regarding cxr results waiting for page back

## 2017-07-19 NOTE — Progress Notes (Signed)
ANTICOAGULATION CONSULT NOTE - F/u Consult  Pharmacy Consult for heparin dosing Indication: atrial fibrillation  No Known Allergies  Patient Measurements: Height: 6' (182.9 cm) Weight: 238 lb 15.7 oz (108.4 kg) IBW/kg (Calculated) : 77.6 Heparin Dosing Weight: 99.9   Vital Signs: Temp: 97.4 F (36.3 C) (10/18 0732) Temp Source: Oral (10/18 0732) BP: 124/99 (10/18 0732) Pulse Rate: 72 (10/18 0732)  Labs:  Recent Labs  07/18/17 1527 07/18/17 2309 07/19/17 0425  HGB 14.8  --  15.1  HCT 45.7  --  45.6  PLT 411*  --  370  HEPARINUNFRC  --  0.49 0.41  CREATININE 2.13*  --  2.15*  TROPONINI  --  0.03* 0.03*    Estimated Creatinine Clearance: 44.1 mL/min (A) (by C-G formula based on SCr of 2.15 mg/dL (H)).   Medical History: Past Medical History:  Diagnosis Date  . Dysrhythmia     Medications:  Scheduled:  . diltiazem  180 mg Oral Daily  . furosemide  40 mg Intravenous Q12H   Assessment: Jeffery Christian admitted with SOB. History of afib, not on anticoagulation PTA. Pharmacy consulted to dose heparin.   Heparin level therapeutic: 0.41; Pt transferred from outside hospital overnight, no bleeding or infusion related issues documented   Goal of Therapy:  Heparin level 0.3-0.7 units/ml Monitor platelets by anticoagulation protocol: Yes   Plan:  Continue heparin gtt at 1500 units/hr  Daily heparin level and CBC Monitor for s/s bleeding F/u long-term AC plan   Ruben Im, PharmD Clinical Pharmacist 07/19/2017 8:18 AM

## 2017-07-19 NOTE — Progress Notes (Signed)
Paged dr Ella Jubilee, pt c/o sob, dizziness, sats 76% on room air, auscultated lung  diminished in LLL, scheduled lasix given. New orders rec'd to get CXR. Will continue to monitor.

## 2017-07-19 NOTE — Progress Notes (Signed)
Pt complaining of insomnia, stated he has not slept in 4-5 days, requesting sleep medication. MD paged. 50 mg Trazodone PO once ordered. Will continue to monitor.

## 2017-07-19 NOTE — Progress Notes (Signed)
Paged dr Ella Jubilee regarding pt felt dizzy about an hour after given toprol xl. Will continue to monitor.

## 2017-07-19 NOTE — Progress Notes (Signed)
Progress Note  Patient Name: Jeffery Christian Date of Encounter: 07/19/2017  Primary Cardiologist: Tenny Craw  Subjective   SOB. No CP  Inpatient Medications    Scheduled Meds: . diltiazem  180 mg Oral Daily  . furosemide  60 mg Intravenous Q12H   Continuous Infusions: . diltiazem (CARDIZEM) infusion 5 mg/hr (07/19/17 0037)  . heparin 1,500 Units/hr (07/19/17 0617)   PRN Meds: acetaminophen **OR** acetaminophen, Influenza vac split quadrivalent PF, ondansetron **OR** ondansetron (ZOFRAN) IV   Vital Signs    Vitals:   07/19/17 0600 07/19/17 0732 07/19/17 0800 07/19/17 0830  BP: 127/89 (!) 124/99 119/83 (!) 130/99  Pulse: 73 72 (!) 38 82  Resp: 14 (!) 31 (!) 36 (!) 32  Temp:  (!) 97.4 F (36.3 C)    TempSrc:  Oral    SpO2: (!) 84% 91% 93% 98%  Weight:      Height:        Intake/Output Summary (Last 24 hours) at 07/19/17 1058 Last data filed at 07/19/17 1000  Gross per 24 hour  Intake          1029.67 ml  Output             1700 ml  Net          -670.33 ml   Filed Weights   07/18/17 1600 07/18/17 2256 07/19/17 0440  Weight: 235 lb (106.6 kg) 238 lb 12.1 oz (108.3 kg) 238 lb 15.7 oz (108.4 kg)    Telemetry    AFIB improved rate control - Personally Reviewed  ECG    afib 166 - Personally Reviewed  Physical Exam   GEN: No acute distress.   Neck: No JVD Cardiac: irreg irreg, no murmurs, rubs, or gallops.  Respiratory: Clear to auscultation bilaterally. GI: Soft, nontender, non-distended  MS: No edema; No deformity. Neuro:  Nonfocal  Psych: Normal affect   Labs    Chemistry Recent Labs Lab 07/18/17 1527 07/19/17 0425  NA 141 142  K 5.2* 4.3  CL 108 108  CO2 25 22  GLUCOSE 136* 90  BUN 30* 35*  CREATININE 2.13* 2.15*  CALCIUM 9.0 8.9  PROT 6.3*  --   ALBUMIN 3.1*  --   AST 33  --   ALT 38  --   ALKPHOS 65  --   BILITOT 0.5  --   GFRNONAA 31* 31*  GFRAA 36* 36*  ANIONGAP 8 12     Hematology Recent Labs Lab 07/18/17 1527 07/19/17 0425    WBC 11.2* 10.9*  RBC 5.27 5.28  HGB 14.8 15.1  HCT 45.7 45.6  MCV 86.7 86.4  MCH 28.1 28.6  MCHC 32.4 33.1  RDW 14.4 14.2  PLT 411* 370    Cardiac Enzymes Recent Labs Lab 07/18/17 2309 07/19/17 0425  TROPONINI 0.03* 0.03*    Recent Labs Lab 07/18/17 1546  TROPIPOC 0.04     BNP Recent Labs Lab 07/18/17 1629  BNP 845.9*     DDimer No results for input(s): DDIMER in the last 168 hours.   Radiology    Dg Chest Port 1 View  Result Date: 07/18/2017 CLINICAL DATA:  Cough for 2 weeks EXAM: PORTABLE CHEST 1 VIEW COMPARISON:  None. FINDINGS: Cardiomegaly. Asymmetric streaky opacities in the left mid lung. Small bilateral pleural effusions. No pneumothorax. IMPRESSION: Cardiomegaly and suspected small bilateral pleural effusions with asymmetric left mid lung opacities. This may indicate pneumonia or asymmetric pulmonary edema. Electronically Signed   By: Chrisandra Netters.D.  On: 07/18/2017 17:25    Cardiac Studies   ECHO read pending - EF looks moderately reduced at first glance.   Patient Profile     66 y.o. male with PAF, CKD, possible PNA with acute systolic/ diastolic heart failure.   Assessment & Plan    PAF  - improved heart rate with diltiazem IV.   - Given what appears to be reduced EF, will switch him to Toprol 50mg  PO QD to   - If able to get assistance for medication, could place on Xarelto 15mg  PO QD (adjusted for CrCl <50).    Systolic heart failure, acute  - Starting Toprol. Holding off of using ACE-I given creatinine.   - Continue with IV lasix - net out 1.2L  - As outpatient will repeat ECHO in 2-3 months. 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   For questions or updates, please contact [facility] Please consult [email or web address] for contact info under Cardiology/STEMI.      Signed, Donato SchultzMark Skains, MD  07/19/2017, 10:58 AM

## 2017-07-19 NOTE — Progress Notes (Signed)
rec'd page from dr arrien new orders rec'd for lasix 80mg  iv and bipap. Notified respiratory therapist of bipap order, may need later as tolerating nasal cannula

## 2017-07-20 DIAGNOSIS — I501 Left ventricular failure: Secondary | ICD-10-CM

## 2017-07-20 LAB — CBC WITH DIFFERENTIAL/PLATELET
Basophils Absolute: 0 10*3/uL (ref 0.0–0.1)
Basophils Relative: 0 %
EOS ABS: 0 10*3/uL (ref 0.0–0.7)
EOS PCT: 0 %
HCT: 44.1 % (ref 39.0–52.0)
HEMOGLOBIN: 14.2 g/dL (ref 13.0–17.0)
Lymphocytes Relative: 19 %
Lymphs Abs: 2 10*3/uL (ref 0.7–4.0)
MCH: 27.8 pg (ref 26.0–34.0)
MCHC: 32.2 g/dL (ref 30.0–36.0)
MCV: 86.3 fL (ref 78.0–100.0)
MONO ABS: 1.1 10*3/uL — AB (ref 0.1–1.0)
Monocytes Relative: 10 %
NEUTROS PCT: 71 %
Neutro Abs: 7.4 10*3/uL (ref 1.7–7.7)
Platelets: 411 10*3/uL — ABNORMAL HIGH (ref 150–400)
RBC: 5.11 MIL/uL (ref 4.22–5.81)
RDW: 14.3 % (ref 11.5–15.5)
WBC: 10.5 10*3/uL (ref 4.0–10.5)

## 2017-07-20 LAB — HEPARIN LEVEL (UNFRACTIONATED)
HEPARIN UNFRACTIONATED: 0.39 [IU]/mL (ref 0.30–0.70)
Heparin Unfractionated: 0.2 IU/mL — ABNORMAL LOW (ref 0.30–0.70)

## 2017-07-20 LAB — BASIC METABOLIC PANEL
ANION GAP: 10 (ref 5–15)
BUN: 43 mg/dL — ABNORMAL HIGH (ref 6–20)
CHLORIDE: 103 mmol/L (ref 101–111)
CO2: 26 mmol/L (ref 22–32)
Calcium: 8.9 mg/dL (ref 8.9–10.3)
Creatinine, Ser: 2.31 mg/dL — ABNORMAL HIGH (ref 0.61–1.24)
GFR calc non Af Amer: 28 mL/min — ABNORMAL LOW (ref >60)
GFR, EST AFRICAN AMERICAN: 33 mL/min — AB (ref >60)
Glucose, Bld: 115 mg/dL — ABNORMAL HIGH (ref 65–99)
POTASSIUM: 4.2 mmol/L (ref 3.5–5.1)
SODIUM: 139 mmol/L (ref 135–145)

## 2017-07-20 MED ORDER — FUROSEMIDE 10 MG/ML IJ SOLN
80.0000 mg | Freq: Two times a day (BID) | INTRAMUSCULAR | Status: DC
  Administered 2017-07-20 – 2017-07-21 (×3): 80 mg via INTRAVENOUS
  Filled 2017-07-20 (×4): qty 8

## 2017-07-20 NOTE — Progress Notes (Signed)
ANTICOAGULATION CONSULT NOTE - F/u Consult  Pharmacy Consult for heparin dosing Indication: atrial fibrillation  No Known Allergies  Patient Measurements: Height: 6' (182.9 cm) Weight: 237 lb 14 oz (107.9 kg) IBW/kg (Calculated) : 77.6 Heparin Dosing Weight: 99.9   Vital Signs: Temp: 97.4 F (36.3 C) (10/19 1152) Temp Source: Oral (10/19 1152) BP: 120/96 (10/19 0410) Pulse Rate: 81 (10/19 0410)  Labs:  Recent Labs  07/18/17 1527  07/18/17 2309 07/19/17 0425 07/19/17 1013 07/20/17 0334 07/20/17 1326  HGB 14.8  --   --  15.1  --  14.2  --   HCT 45.7  --   --  45.6  --  44.1  --   PLT 411*  --   --  370  --  411*  --   HEPARINUNFRC  --   < > 0.49 0.41  --  0.20* 0.39  CREATININE 2.13*  --   --  2.15*  --  2.31*  --   TROPONINI  --   --  0.03* 0.03* <0.03  --   --   < > = values in this interval not displayed.  Estimated Creatinine Clearance: 41 mL/min (A) (by C-G formula based on SCr of 2.31 mg/dL (H)).   Medical History: Past Medical History:  Diagnosis Date  . Dysrhythmia     Medications:  Scheduled:  . furosemide  80 mg Intravenous Q12H  . metoprolol succinate  50 mg Oral Daily   Assessment: Jeffery Christian admitted with SOB. History of afib, not on anticoagulation PTA. Pharmacy consulted to dose heparin.   Heparin level therapeutic: 0.39; no bleeding or infusion related issues documented   Goal of Therapy:  Heparin level 0.3-0.7 units/ml Monitor platelets by anticoagulation protocol: Yes   Plan:  Continue heparin gtt at 1700 units/hr Daily heparin level and CBC Monitor for s/s bleeding F/u long-term AC plan   Ruben Im, PharmD Clinical Pharmacist 07/20/2017 2:19 PM

## 2017-07-20 NOTE — Progress Notes (Signed)
ANTICOAGULATION CONSULT NOTE - Follow Up Consult  Pharmacy Consult for heparin Indication: atrial fibrillation  Labs:  Recent Labs  07/18/17 1527 07/18/17 2309 07/19/17 0425 07/19/17 1013 07/20/17 0334  HGB 14.8  --  15.1  --  14.2  HCT 45.7  --  45.6  --  44.1  PLT 411*  --  370  --  PENDING  HEPARINUNFRC  --  0.49 0.41  --  0.20*  CREATININE 2.13*  --  2.15*  --  2.31*  TROPONINI  --  0.03* 0.03* <0.03  --     Assessment: 66yo male now below goal on heparin after two levels at goal though had been trending down.  Goal of Therapy:  Heparin level 0.3-0.7 units/ml   Plan:  Will increase heparin gtt by 2 units/kg/hr to 1700 units/hr and check level in 8hr.  Vernard Gambles, PharmD, BCPS  07/20/2017,6:33 AM

## 2017-07-20 NOTE — Progress Notes (Signed)
PROGRESS NOTE    Jeffery Christian  ULA:[id number] DOB: 1951-09-02 DOA: 07/18/2017 PCP: Patient, No Pcp Per    Brief Narrative:  66 year old male who presented with dyspnea. Patient does have the significant past medical history for paroxysmal atrial fibrillation. Patient had progressive symptoms for last 2 weeks, more severe over last 48 hours, reporting orthopnea and lower extremity edema.on his initial physical examination, blood pressure 118/94, heart rate 151-169, respiratory rate 27-45, oxygen saturation 88-95%. Moist mucous membranes, lungs clear to auscultation bilaterally, heart S1-S2 present irregularly irregular, no murmurs, rubs or gallops, the abdomen was soft nontender, trace lower extremity edema. Sodium 141, potassium 5.2, chloride 108, bicarbonate 25, glucose 136, BUN 30, creatinine 2.13, white count 11.2, hemoglobin14.8, hematocrit 45.7, platelets 411. Chest x-ray, left rotation, hypoinflation, increase interstitial markings bilaterally more prominent left hilar region, moderate cardiomegaly. EKG with atrial fibrillation, rate 166/m, positive PVC.  Patient admitted to the hospital working diagnosis of atrial fibrillation with rapid ventricular response.   Assessment & Plan:   Principal Problem:   Atrial fibrillation with RVR (HCC) Active Problems:   Acute CHF (congestive heart failure) (HCC)   History of gout  1. Atrial fibrillation with rapid ventricular response. Patient successfully weaned off IV diltiazem, will continue rate control with po metoprolol succinate 50 mg. Heart rate in the 80's. Keep negative fluid balance. Will continue anticoagulation with heparin.    2. Acute cardiogenic pulmonary edema due to acute on chronic systolic heart failure decompensation. Will continue aggressive diuresis, this am with improved symptoms, urine output up to -3,175. Will restrict fluids rto 1000 ml per day. Follow up echocardiogram with reduced lv systolic function down to 35 to 40%,  with diffuse hypokinesis, cw non ischemic cardiomyopathy.   3. Chronic kidney disease stage IV. Stable renal function with serum cr at 2,31 from 2,15, will continue with aggressive diuresis, furosemide 80 mg iv bid, k at 4,2 and serum bicarbonate at 26.   4. History of gout. Stable with no signs of exacerbation.  DVT prophylaxis: heparin  Code Status: full Family Communication:  Disposition Plan:    Consultants:   Cardiology   Procedures:     Antimicrobials:      Subjective: Patient had episode of respiratory distress with worsening pulmonary edema yesterday evening, improved with diureses. This am feeling better, edema has improved along with dyspnea. No chest pain, no nausea or vomiting.   Objective: Vitals:   07/20/17 0326 07/20/17 0410 07/20/17 0500 07/20/17 0700  BP: 106/74 (!) 120/96    Pulse:  81    Resp:      Temp: (!) 97.5 F (36.4 C) 98.1 F (36.7 C)  98.1 F (36.7 C)  TempSrc: Oral Oral  Oral  SpO2:  99%    Weight:   107.9 kg (237 lb 14 oz)   Height:        Intake/Output Summary (Last 24 hours) at 07/20/17 0819 Last data filed at 07/20/17 2122  Gross per 24 hour  Intake           1211.5 ml  Output             2625 ml  Net          -1413.5 ml   Filed Weights   07/18/17 2256 07/19/17 0440 07/20/17 0500  Weight: 108.3 kg (238 lb 12.1 oz) 108.4 kg (238 lb 15.7 oz) 107.9 kg (237 lb 14 oz)    Examination:   General: Not in pain or dyspnea. deconditioned Neurology: Awake and  alert, non focal  E ENT: mild pallor, no icterus, oral mucosa moist Cardiovascular: Mild JVD. S1-S2 present, irregularly irregular , no gallops, rubs, or murmurs. +++ pitting lower extremity edema. Pulmonary: decreased breath sounds bilaterally more at bases, adequate air movement, no wheezing, rhonchi. scattered rales. Gastrointestinal. Abdomen protuberant, no organomegaly, non tender, no rebound or guarding Skin. No rashes Musculoskeletal: no joint  deformities     Data Reviewed: I have personally reviewed following labs and imaging studies  CBC:  Recent Labs Lab 07/18/17 1527 07/19/17 0425 07/20/17 0334  WBC 11.2* 10.9* 10.5  NEUTROABS  --   --  7.4  HGB 14.8 15.1 14.2  HCT 45.7 45.6 44.1  MCV 86.7 86.4 86.3  PLT 411* 370 411*   Basic Metabolic Panel:  Recent Labs Lab 07/18/17 1527 07/18/17 2309 07/19/17 0425 07/20/17 0334  NA 141  --  142 139  K 5.2*  --  4.3 4.2  CL 108  --  108 103  CO2 25  --  22 26  GLUCOSE 136*  --  90 115*  BUN 30*  --  35* 43*  CREATININE 2.13*  --  2.15* 2.31*  CALCIUM 9.0  --  8.9 8.9  MG  --  2.1  --   --    GFR: Estimated Creatinine Clearance: 41 mL/min (A) (by C-G formula based on SCr of 2.31 mg/dL (H)). Liver Function Tests:  Recent Labs Lab 07/18/17 1527  AST 33  ALT 38  ALKPHOS 65  BILITOT 0.5  PROT 6.3*  ALBUMIN 3.1*   No results for input(s): LIPASE, AMYLASE in the last 168 hours. No results for input(s): AMMONIA in the last 168 hours. Coagulation Profile: No results for input(s): INR, PROTIME in the last 168 hours. Cardiac Enzymes:  Recent Labs Lab 07/18/17 2309 07/19/17 0425 07/19/17 1013  TROPONINI 0.03* 0.03* <0.03   BNP (last 3 results) No results for input(s): PROBNP in the last 8760 hours. HbA1C: No results for input(s): HGBA1C in the last 72 hours. CBG: No results for input(s): GLUCAP in the last 168 hours. Lipid Profile: No results for input(s): CHOL, HDL, LDLCALC, TRIG, CHOLHDL, LDLDIRECT in the last 72 hours. Thyroid Function Tests:  Recent Labs  07/18/17 1629  TSH 2.183   Anemia Panel: No results for input(s): VITAMINB12, FOLATE, FERRITIN, TIBC, IRON, RETICCTPCT in the last 72 hours.    Radiology Studies: I have reviewed all of the imaging during this hospital visit personally     Scheduled Meds: . furosemide  60 mg Intravenous Q12H  . metoprolol succinate  50 mg Oral Daily   Continuous Infusions: . heparin 1,700  Units/hr (07/20/17 [id number])     LOS: 2 days        Ramsie Ostrander Annett Gulaaniel Michalina Calbert, MD [facility] Pager [phone]

## 2017-07-20 NOTE — Progress Notes (Signed)
Progress Note  Patient Name: Jeffery Christian Date of Encounter: 07/20/2017  Primary Cardiologist: Dr. Tenny Craw  Subjective   Feels significantly better. Breathing improved. No chest pain. Still with significant LEE.   Inpatient Medications    Scheduled Meds: . furosemide  80 mg Intravenous Q12H  . metoprolol succinate  50 mg Oral Daily   Continuous Infusions: . heparin 1,700 Units/hr (07/20/17 0100)   PRN Meds: acetaminophen **OR** acetaminophen, Influenza vac split quadrivalent PF, ipratropium-albuterol, menthol-cetylpyridinium, ondansetron **OR** ondansetron (ZOFRAN) IV   Vital Signs    Vitals:   07/20/17 0410 07/20/17 0500 07/20/17 0700 07/20/17 1152  BP: (!) 120/96     Pulse: 81     Resp:      Temp: 98.1 F (36.7 C)  98.1 F (36.7 C) (!) 97.4 F (36.3 C)  TempSrc: Oral  Oral Oral  SpO2: 99%     Weight:  237 lb 14 oz (107.9 kg)    Height:        Intake/Output Summary (Last 24 hours) at 07/20/17 1226 Last data filed at 07/20/17 1037  Gross per 24 hour  Intake            921.5 ml  Output             1525 ml  Net           -603.5 ml   Filed Weights   07/18/17 2256 07/19/17 0440 07/20/17 0500  Weight: 238 lb 12.1 oz (108.3 kg) 238 lb 15.7 oz (108.4 kg) 237 lb 14 oz (107.9 kg)    Telemetry    Atrial fibrillation with a CVR - Personally Reviewed  ECG    Atrial fibrillation  - Personally Reviewed  Physical Exam   GEN: No acute distress.   Neck: No JVD Cardiac: irregularlly irregular, regular rate, no murmurs, rubs, or gallops.  Respiratory: Clear to auscultation bilaterally. GI: Soft, nontender, non-distended  MS: 2+ bilateral LE pitting edema; No deformity. Neuro:  Nonfocal  Psych: Normal affect   Labs    Chemistry Recent Labs Lab 07/18/17 1527 07/19/17 0425 07/20/17 0334  NA 141 142 139  K 5.2* 4.3 4.2  CL 108 108 103  CO2 25 22 26   GLUCOSE 136* 90 115*  BUN 30* 35* 43*  CREATININE 2.13* 2.15* 2.31*  CALCIUM 9.0 8.9 8.9  PROT 6.3*  --    --   ALBUMIN 3.1*  --   --   AST 33  --   --   ALT 38  --   --   ALKPHOS 65  --   --   BILITOT 0.5  --   --   GFRNONAA 31* 31* 28*  GFRAA 36* 36* 33*  ANIONGAP 8 12 10      Hematology Recent Labs Lab 07/18/17 1527 07/19/17 0425 07/20/17 0334  WBC 11.2* 10.9* 10.5  RBC 5.27 5.28 5.11  HGB 14.8 15.1 14.2  HCT 45.7 45.6 44.1  MCV 86.7 86.4 86.3  MCH 28.1 28.6 27.8  MCHC 32.4 33.1 32.2  RDW 14.4 14.2 14.3  PLT 411* 370 411*    Cardiac Enzymes Recent Labs Lab 07/18/17 2309 07/19/17 0425 07/19/17 1013  TROPONINI 0.03* 0.03* <0.03    Recent Labs Lab 07/18/17 1546  TROPIPOC 0.04     BNP Recent Labs Lab 07/18/17 1629  BNP 845.9*     DDimer No results for input(s): DDIMER in the last 168 hours.   Radiology    Dg Chest [facility] 1 View  Result  Date: 07/19/2017 CLINICAL DATA:  Shortness of breath EXAM: PORTABLE CHEST 1 VIEW COMPARISON:  07/18/2017 FINDINGS: Cardiomegaly with central vascular congestion and worsened bilateral interstitial and alveolar opacity. Increased small bilateral effusions. No pneumothorax. IMPRESSION: Cardiomegaly with worsening vascular congestion and bilateral airspace disease suggesting pulmonary edema or diffuse pneumonia. Increasing pleural effusions. Electronically Signed   By: Jasmine PangKim  Fujinaga M.D.   On: 07/19/2017 18:32   Dg Chest Port 1 View  Result Date: 07/18/2017 CLINICAL DATA:  Cough for 2 weeks EXAM: PORTABLE CHEST 1 VIEW COMPARISON:  None. FINDINGS: Cardiomegaly. Asymmetric streaky opacities in the left mid lung. Small bilateral pleural effusions. No pneumothorax. IMPRESSION: Cardiomegaly and suspected small bilateral pleural effusions with asymmetric left mid lung opacities. This may indicate pneumonia or asymmetric pulmonary edema. Electronically Signed   By: Deatra RobinsonKevin  Herman M.D.   On: 07/18/2017 17:25    Cardiac Studies   2D Echo 07/19/17 Study Conclusions  - Left ventricle: The cavity size was normal. Systolic function was    moderately reduced. The estimated ejection fraction was in the   range of 35% to 40%. Diffuse hypokinesis. The study was not   technically sufficient to allow evaluation of LV diastolic   dysfunction due to atrial fibrillation. - Mitral valve: There was mild regurgitation. - Left atrium: The atrium was moderately dilated. - Right ventricle: Systolic function was normal. - Right atrium: The atrium was mildly dilated. - Pulmonary arteries: Systolic pressure was moderately increased.   PA peak pressure: 44 mm Hg (S). - Inferior vena cava: The vessel was dilated. The respirophasic   diameter changes were blunted (< 50%), consistent with elevated   central venous pressure. - Pericardium, extracardiac: There was no pericardial effusion.   There was a left pleural effusion.  Impressions:  - When compared to the prior study from 03/23/2014 LVEF has   decreased from 55-60% to 35-40%. The LVEF evaluation might be   affected by atrial fibrillation with RVR during the acquisition.  Patient Profile     66 y.o. male with PAF, CKD, possible PNA with acute systolic/ diastolic heart failure.   Assessment & Plan    1. Acute Systolic and Diastolic HF: still volume overloaded on edam with 2+ bilateral LEE pitting edema. Continue to diurese with IV Lasix. Monitor renal function, K, BP and strict I/Os. Low sodium diet. Continue on BB for LV dysfunction. ACE-I on hold given renal insuffiencey. EF moderately reduced at 35-40%, previously 55-60%. ? tachy mediated from rapid afib. Plan on f/u 2D echo in 2-3 months, now that HR is improved. If still reduced, will need ischemic w/u as an outpatient.   2. PAF: now rate controlled w/ BB. Pt asymptomatic currently. Continue rate control therapy with metoprolol. Currently on IV heparin with plans to transition to Xarelto..   3. Renal Insuffiencey: slight further increase in SCr from 2.1 to 2.3. Monitor closely while diuresing.   For questions or updates, please  contact [facility] Please consult [email or web address] for contact info under Cardiology/STEMI.      Signed, Robbie LisBrittainy Simmons, PA-C  07/20/2017, 12:26 PM    Personally seen and examined. Agree with above.  Improved breathing, +edema Tele personally viewed AFIB with rate control Irreg irreg,   Acute systolic HF/ dilated cardiomyopathy/PAF  - EF 40%  - IV lasix, continue  - Toprol 50 for rate control and HF mgt.   - Repeat echo in 2-3 months post med tx, hopefully in part tachy mediated  Donato SchultzMark Skains, MD

## 2017-07-21 DIAGNOSIS — I5041 Acute combined systolic (congestive) and diastolic (congestive) heart failure: Secondary | ICD-10-CM

## 2017-07-21 DIAGNOSIS — R0602 Shortness of breath: Secondary | ICD-10-CM

## 2017-07-21 LAB — BASIC METABOLIC PANEL
Anion gap: 10 (ref 5–15)
BUN: 42 mg/dL — AB (ref 6–20)
CHLORIDE: 106 mmol/L (ref 101–111)
CO2: 23 mmol/L (ref 22–32)
CREATININE: 1.79 mg/dL — AB (ref 0.61–1.24)
Calcium: 8.7 mg/dL — ABNORMAL LOW (ref 8.9–10.3)
GFR calc Af Amer: 44 mL/min — ABNORMAL LOW (ref >60)
GFR calc non Af Amer: 38 mL/min — ABNORMAL LOW (ref >60)
GLUCOSE: 109 mg/dL — AB (ref 65–99)
POTASSIUM: 3.7 mmol/L (ref 3.5–5.1)
Sodium: 139 mmol/L (ref 135–145)

## 2017-07-21 LAB — HEPARIN LEVEL (UNFRACTIONATED): Heparin Unfractionated: 0.18 IU/mL — ABNORMAL LOW (ref 0.30–0.70)

## 2017-07-21 MED ORDER — METOPROLOL SUCCINATE ER 25 MG PO TB24
25.0000 mg | ORAL_TABLET | Freq: Once | ORAL | Status: AC
  Administered 2017-07-21: 25 mg via ORAL
  Filled 2017-07-21: qty 1

## 2017-07-21 MED ORDER — METOPROLOL SUCCINATE ER 50 MG PO TB24
75.0000 mg | ORAL_TABLET | Freq: Every day | ORAL | Status: DC
  Administered 2017-07-22: 75 mg via ORAL
  Filled 2017-07-21: qty 1

## 2017-07-21 MED ORDER — APIXABAN 5 MG PO TABS
5.0000 mg | ORAL_TABLET | Freq: Two times a day (BID) | ORAL | Status: DC
  Administered 2017-07-21 – 2017-07-24 (×7): 5 mg via ORAL
  Filled 2017-07-21 (×7): qty 1

## 2017-07-21 NOTE — Progress Notes (Signed)
2315 -Notified of patient having a room assignment(6E02) 2325 - report called to Rhohina, RN on 6E; pt updated on plan of care/new room assignment

## 2017-07-21 NOTE — Progress Notes (Signed)
PROGRESS NOTE    Jeffery GreenhouseGary Christian  AVW:[id number] DOB: 1950-11-03 DOA: 07/18/2017 PCP: Patient, No Pcp Per     Brief Narrative:  66 y/o her with SOB. Has h/o a fib; found to be in a fib with RVR.   Assessment & Plan:   Principal Problem:   Atrial fibrillation with RVR (HCC) Active Problems:   Acute CHF (congestive heart failure) (HCC)   History of gout   1. Atrial fibrillation with rapid ventricular response. Patient successfully weaned off IV diltiazem, will continue rate control with po metoprolol succinate 50 mg. Heart rate well controlled. Keep negative fluid balance. Will continue anticoagulation with heparin. Will transition to Eliquis today.  2. Acute cardiogenic pulmonary edema due to acute on chronic systolic heart failure decompensation.Will continue aggressive diuresis, this am with improved symptoms, Is 3.577 L negative since admission. Still remains volume overloaded on exam, but no current oxygen requirements. Follow up echocardiogram with reduced LV systolic function down to 35 to 40%, with diffuse hypokinesis, c/w non ischemic cardiomyopathy. Wonder if decrease in EF may be tachy mediated. Would continue IV diuresis for now, especially given improved renal function.  3. Chronic kidney disease stage IV. Baseline Cr appears to be around 1.9-2.3. Renal function is better than baseline currently at 1.79 in spite of diuresis.  4. History of gout. Stable with no signs of exacerbation.   DVT prophylaxis: IV heparin. Transition to eliquis today. Code Status: Full Code Family Communication: Friend at bedside Disposition Plan: Transfer to telemetry. Anticipate DC home in 48-72 hours.  Consultants:   Cardiology  Procedures:  Echo: - When compared to the prior study from 03/23/2014 LVEF has   decreased from 55-60% to 35-40%. The LVEF evaluation might be   affected by atrial fibrillation with RVR during the acquisition.    Antimicrobials:  Anti-infectives    Start      Dose/Rate Route Frequency Ordered Stop   07/18/17 1915  cefTRIAXone (ROCEPHIN) 1 g in dextrose 5 % 50 mL IVPB     1 g 100 mL/hr over 30 Minutes Intravenous  Once 07/18/17 1903 07/18/17 2159   07/18/17 1915  azithromycin (ZITHROMAX) 500 mg in dextrose 5 % 250 mL IVPB     500 mg 250 mL/hr over 60 Minutes Intravenous  Once 07/18/17 1903 07/18/17 2159       Subjective: Has no complaints today. SOB has resolved. No CP.  Objective: Vitals:   07/20/17 1508 07/20/17 1925 07/20/17 2355 07/21/17 0433  BP:  98/72 115/83 (!) 132/100  Pulse: 85 92 (!) 102 96  Resp:  20 (!) 28 (!) 21  Temp: (!) 97.3 F (36.3 C) 97.8 F (36.6 C) 98 F (36.7 C) 98.3 F (36.8 C)  TempSrc: Oral Axillary Oral Oral  SpO2: 99% 92% 96% 94%  Weight:    104.6 kg (230 lb 11.2 oz)  Height:        Intake/Output Summary (Last 24 hours) at 07/21/17 0900 Last data filed at 07/21/17 0600  Gross per 24 hour  Intake              873 ml  Output             3380 ml  Net            -2507 ml   Filed Weights   07/19/17 0440 07/20/17 0500 07/21/17 0433  Weight: 108.4 kg (238 lb 15.7 oz) 107.9 kg (237 lb 14 oz) 104.6 kg (230 lb 11.2 oz)    Examination:  General exam: Alert, awake, oriented x 3 Respiratory system: Clear to auscultation. Respiratory effort normal. Cardiovascular system: irregular, no M/R/G Gastrointestinal system: Abdomen is nondistended, soft and nontender. No organomegaly or masses felt. Normal bowel sounds heard. Central nervous system: Alert and oriented. No focal neurological deficits. Extremities: 3+ pitting edema to above knees bilaterally Skin: No rashes, lesions or ulcers Psychiatry: Judgement and insight appear normal. Mood & affect appropriate.     Data Reviewed: I have personally reviewed following labs and imaging studies  CBC:  Recent Labs Lab 07/18/17 1527 07/19/17 0425 07/20/17 0334  WBC 11.2* 10.9* 10.5  NEUTROABS  --   --  7.4  HGB 14.8 15.1 14.2  HCT 45.7 45.6 44.1    MCV 86.7 86.4 86.3  PLT 411* 370 411*   Basic Metabolic Panel:  Recent Labs Lab 07/18/17 1527 07/18/17 2309 07/19/17 0425 07/20/17 0334 07/21/17 0249  NA 141  --  142 139 139  K 5.2*  --  4.3 4.2 3.7  CL 108  --  108 103 106  CO2 25  --  GLUCOSE 136*  --  90 115* 109*  BUN 30*  --  35* 43* 42*  CREATININE 2.13*  --  2.15* 2.31* 1.79*  CALCIUM 9.0  --  8.9 8.9 8.7*  MG  --  2.1  --   --   --    GFR: Estimated Creatinine Clearance: 52.1 mL/min (A) (by C-G formula based on SCr of 1.79 mg/dL (H)). Liver Function Tests:  Recent Labs Lab 07/18/17 1527  AST 33  ALT 38  ALKPHOS 65  BILITOT 0.5  PROT 6.3*  ALBUMIN 3.1*   No results for input(s): LIPASE, AMYLASE in the last 168 hours. No results for input(s): AMMONIA in the last 168 hours. Coagulation Profile: No results for input(s): INR, PROTIME in the last 168 hours. Cardiac Enzymes:  Recent Labs Lab 07/18/17 2309 07/19/17 0425 07/19/17 1013  TROPONINI 0.03* 0.03* <0.03   BNP (last 3 results) No results for input(s): PROBNP in the last 8760 hours. HbA1C: No results for input(s): HGBA1C in the last 72 hours. CBG: No results for input(s): GLUCAP in the last 168 hours. Lipid Profile: No results for input(s): CHOL, HDL, LDLCALC, TRIG, CHOLHDL, LDLDIRECT in the last 72 hours. Thyroid Function Tests:  Recent Labs  07/18/17 1629  TSH 2.183   Anemia Panel: No results for input(s): VITAMINB12, FOLATE, FERRITIN, TIBC, IRON, RETICCTPCT in the last 72 hours. Urine analysis:    Component Value Date/Time   COLORURINE STRAW (A) 07/19/2017 0850   APPEARANCEUR CLEAR 07/19/2017 0850   LABSPEC 1.005 07/19/2017 0850   PHURINE 5.0 07/19/2017 0850   GLUCOSEU NEGATIVE 07/19/2017 0850   HGBUR NEGATIVE 07/19/2017 0850   BILIRUBINUR NEGATIVE 07/19/2017 0850   KETONESUR NEGATIVE 07/19/2017 0850   PROTEINUR NEGATIVE 07/19/2017 0850   NITRITE NEGATIVE 07/19/2017 0850   LEUKOCYTESUR NEGATIVE 07/19/2017 0850    Sepsis Labs: (procalcitonin:4,lacticidven:4)  ) Recent Results (from the past 240 hour(s))  Culture, blood (routine x 2)     Status: None (Preliminary result)   Collection Time: 07/18/17  7:40 PM  Result Value Ref Range Status   Specimen Description BLOOD RIGHT HAND  Final   Special Requests   Final    BOTTLES DRAWN AEROBIC AND ANAEROBIC Blood Culture adequate volume   Culture NO GROWTH 2 DAYS  Final   Report Status PENDING  Incomplete  Culture, blood (routine x 2)     Status: None (Preliminary  result)   Collection Time: 07/18/17  7:43 PM  Result Value Ref Range Status   Specimen Description BLOOD LEFT HAND  Final   Special Requests   Final    BOTTLES DRAWN AEROBIC AND ANAEROBIC Blood Culture adequate volume   Culture NO GROWTH 2 DAYS  Final   Report Status PENDING  Incomplete  MRSA PCR Screening     Status: None   Collection Time: 07/18/17 10:57 PM  Result Value Ref Range Status   MRSA by PCR NEGATIVE NEGATIVE Final    Comment:        The GeneXpert MRSA Assay (FDA approved for NASAL specimens only), is one component of a comprehensive MRSA colonization surveillance program. It is not intended to diagnose MRSA infection nor to guide or monitor treatment for MRSA infections.          Radiology Studies: Dg Chest Port 1 View  Result Date: 07/19/2017 CLINICAL DATA:  Shortness of breath EXAM: PORTABLE CHEST 1 VIEW COMPARISON:  07/18/2017 FINDINGS: Cardiomegaly with central vascular congestion and worsened bilateral interstitial and alveolar opacity. Increased small bilateral effusions. No pneumothorax. IMPRESSION: Cardiomegaly with worsening vascular congestion and bilateral airspace disease suggesting pulmonary edema or diffuse pneumonia. Increasing pleural effusions. Electronically Signed   By: Jasmine Pang M.D.   On: 07/19/2017 18:32        Scheduled Meds: . furosemide  80 mg Intravenous Q12H  . metoprolol succinate  50 mg Oral Daily   Continuous  Infusions: . heparin 1,900 Units/hr (07/21/17 0600)     LOS: 3 days    Time spent: 35 minutes. Greater than 50% of this time was spent in direct contact with the patient coordinating care.     Chaya Jan, MD [facility] Pager [phone]  If 7PM-7AM, please contact night-coverage [email or web address] Password [email or web address] 07/21/2017, 9:00 AM

## 2017-07-21 NOTE — Discharge Instructions (Signed)

## 2017-07-21 NOTE — Progress Notes (Signed)
ANTICOAGULATION CONSULT NOTE - F/u Consult  Pharmacy Consult for heparin dosing Indication: atrial fibrillation  No Known Allergies  Patient Measurements: Height: 6' (182.9 cm) Weight: 237 lb 14 oz (107.9 kg) IBW/kg (Calculated) : 77.6 Heparin Dosing Weight: 99.9    Vital Signs: Temp: 98 F (36.7 C) (10/19 2355) Temp Source: Oral (10/19 2355) BP: 115/83 (10/19 2355) Pulse Rate: 102 (10/19 2355)  Labs:  Recent Labs  07/18/17 1527  07/18/17 2309 07/19/17 0425 07/19/17 1013 07/20/17 0334 07/20/17 1326 07/21/17 0249  HGB 14.8  --   --  15.1  --  14.2  --   --   HCT 45.7  --   --  45.6  --  44.1  --   --   PLT 411*  --   --  370  --  411*  --   --   HEPARINUNFRC  --   < > 0.49 0.41  --  0.20* 0.39 0.18*  CREATININE 2.13*  --   --  2.15*  --  2.31*  --  1.79*  TROPONINI  --   --  0.03* 0.03* <0.03  --   --   --   < > = values in this interval not displayed.  Estimated Creatinine Clearance: 52.9 mL/min (A) (by C-G formula based on SCr of 1.79 mg/dL (H)).   Medical History: Past Medical History:  Diagnosis Date  . Dysrhythmia     Medications:  Scheduled:  . furosemide  80 mg Intravenous Q12H  . metoprolol succinate  50 mg Oral Daily   Assessment: 65 YOM admitted with SOB. History of afib, not on anticoagulation PTA. Pharmacy consulted to dose heparin.   Heparin level subtherapeutic at 0.18 and no infusion issues per RN. No new CBC, no s/s bleeding per RN.   Goal of Therapy:  Heparin level 0.3-0.7 units/ml Monitor platelets by anticoagulation protocol: Yes   Plan:  Increase heparin gtt to 1900 units/hr Heparin level in 6 hrs Daily heparin level and CBC Monitor for s/s bleeding   Einar Crow, PharmD Clinical Pharmacist 07/21/17 4:21 AM

## 2017-07-21 NOTE — Progress Notes (Signed)
Patient refused BIPAP at this time. RT will monitor as needed.

## 2017-07-21 NOTE — Progress Notes (Signed)
Progress Note  Patient Name: Jeffery Christian Date of Encounter: 07/21/2017  Primary Cardiologist: Dr. Tenny Craw  Subjective   Feeling better.  Shortness of breath improving but he is still unable to lay flat in bed.  He continues to sleep in the recliner.  Inpatient Medications    Scheduled Meds: . apixaban  5 mg Oral BID  . furosemide  80 mg Intravenous Q12H  . metoprolol succinate  50 mg Oral Daily   Continuous Infusions:  PRN Meds: acetaminophen **OR** acetaminophen, Influenza vac split quadrivalent PF, ipratropium-albuterol, menthol-cetylpyridinium, ondansetron **OR** ondansetron (ZOFRAN) IV   Vital Signs    Vitals:   07/20/17 1925 07/20/17 2355 07/21/17 0433 07/21/17 0747  BP: 98/72 115/83 (!) 132/100   Pulse: 92 (!) 102 96   Resp: 20 (!) 28 (!) 21   Temp: 97.8 F (36.6 C) 98 F (36.7 C) 98.3 F (36.8 C) 98.3 F (36.8 C)  TempSrc: Axillary Oral Oral Oral  SpO2: 92% 96% 94%   Weight:   104.6 kg (230 lb 11.2 oz)   Height:        Intake/Output Summary (Last 24 hours) at 07/21/17 1100 Last data filed at 07/21/17 0740  Gross per 24 hour  Intake             1113 ml  Output             3180 ml  Net            -2067 ml   Filed Weights   07/19/17 0440 07/20/17 0500 07/21/17 0433  Weight: 108.4 kg (238 lb 15.7 oz) 107.9 kg (237 lb 14 oz) 104.6 kg (230 lb 11.2 oz)    Telemetry    Atrial fibrillation.  Rate 80s-110s.- Personally Reviewed  ECG    n/a - Personally Reviewed  Physical Exam   VS:  BP (!) 132/100 (BP Location: Right Arm) Comment: Took x3 RN aware  Pulse 96   Temp 98.3 F (36.8 C) (Oral)   Resp (!) 21   Ht 6' (1.829 m)   Wt 104.6 kg (230 lb 11.2 oz)   SpO2 94%   BMI 31.29 kg/m  , BMI Body mass index is 31.29 kg/m. GENERAL:  Well appearing.  No acute distress.  HEENT: Pupils equal round and reactive, fundi not visualized, oral mucosa unremarkable NECK: +JVD, waveform within normal limits, carotid upstroke brisk and symmetric, no bruits, no  thyromegaly LYMPHATICS:  No cervical adenopathy LUNGS:  Clear to auscultation bilaterally HEART: Tachycardic.  Irregularly irregular PMI not displaced or sustained,S1 and S2 within normal limits, no S3, no S4, no clicks, no rubs, no murmurs ABD:  Flat, positive bowel sounds normal in frequency in pitch, no bruits, no rebound, no guarding, no midline pulsatile mass, no hepatomegaly, no splenomegaly EXT:  2 plus pulses throughout, 2+ pitting edema to the knee bilaterally, no cyanosis no clubbing SKIN:  No rashes no nodules NEURO:  Cranial nerves II through XII grossly intact, motor grossly intact throughout [facility]:  Cognitively intact, oriented to person place and time   Labs    Chemistry Recent Labs Lab 07/18/17 1527 07/19/17 0425 07/20/17 0334 07/21/17 0249  NA 141 142 139 139  K 5.2* 4.3 4.2 3.7  CL 108 108 103 106  CO2 25 22 26 23   GLUCOSE 136* 90 115* 109*  BUN 30* 35* 43* 42*  CREATININE 2.13* 2.15* 2.31* 1.79*  CALCIUM 9.0 8.9 8.9 8.7*  PROT 6.3*  --   --   --  ALBUMIN 3.1*  --   --   --   AST 33  --   --   --   ALT 38  --   --   --   ALKPHOS 65  --   --   --   BILITOT 0.5  --   --   --   GFRNONAA 31* 31* 28* 38*  GFRAA 36* 36* 33* 44*  ANIONGAP 8 12 10 10      Hematology Recent Labs Lab 07/18/17 1527 07/19/17 0425 07/20/17 0334  WBC 11.2* 10.9* 10.5  RBC 5.27 5.28 5.11  HGB 14.8 15.1 14.2  HCT 45.7 45.6 44.1  MCV 86.7 86.4 86.3  MCH 28.1 28.6 27.8  MCHC 32.4 33.1 32.2  RDW 14.4 14.2 14.3  PLT 411* 370 411*    Cardiac Enzymes Recent Labs Lab 07/18/17 2309 07/19/17 0425 07/19/17 1013  TROPONINI 0.03* 0.03* <0.03    Recent Labs Lab 07/18/17 1546  TROPIPOC 0.04     BNP Recent Labs Lab 07/18/17 1629  BNP 845.9*     DDimer No results for input(s): DDIMER in the last 168 hours.   Radiology    Dg Chest Port 1 View  Result Date: 07/19/2017 CLINICAL DATA:  Shortness of breath EXAM: PORTABLE CHEST 1 VIEW COMPARISON:  07/18/2017 FINDINGS:  Cardiomegaly with central vascular congestion and worsened bilateral interstitial and alveolar opacity. Increased small bilateral effusions. No pneumothorax. IMPRESSION: Cardiomegaly with worsening vascular congestion and bilateral airspace disease suggesting pulmonary edema or diffuse pneumonia. Increasing pleural effusions. Electronically Signed   By: Jasmine PangKim  Fujinaga M.D.   On: 07/19/2017 18:32    Cardiac Studies   Echo 07/19/17: Study Conclusions  - Left ventricle: The cavity size was normal. Systolic function was   moderately reduced. The estimated ejection fraction was in the   range of 35% to 40%. Diffuse hypokinesis. The study was not   technically sufficient to allow evaluation of LV diastolic   dysfunction due to atrial fibrillation. - Mitral valve: There was mild regurgitation. - Left atrium: The atrium was moderately dilated. - Right ventricle: Systolic function was normal. - Right atrium: The atrium was mildly dilated. - Pulmonary arteries: Systolic pressure was moderately increased.   PA peak pressure: 44 mm Hg (S). - Inferior vena cava: The vessel was dilated. The respirophasic   diameter changes were blunted (< 50%), consistent with elevated   central venous pressure. - Pericardium, extracardiac: There was no pericardial effusion.   There was a left pleural effusion.  Impressions:  - When compared to the prior study from 03/23/2014 LVEF has   decreased from 55-60% to 35-40%. The LVEF evaluation might be   affected by atrial fibrillation with RVR during the acquisition.  Patient Profile     66 y.o. male with CKD III and paroxysmal atrial fibrillation admitted with new onset acute systolic and diastolic heart failure and atrial fibrillation with rapid ventricular response.  Assessment & Plan    # Paroxysmal atrial fibrillation: Heart rates remain poorly-controlled.  We will increase metoprolol to 75 mg daily.  Continue Eliquis.  Would consider restoring sinus rhythm  given his systolic dysfunction.  # Acute systolic and diastolic heart failure: This is presumed to be due to tachycardia.  A plan has been set to diurese him and control his heart rate.  Repeat echo in 3 months and consider ischemia evaluation if his systolic function remains reduced.  Increase metoprolol as above.  BP won't allow for ACE-I/ARB yet.  Continue lasix 80mg   IV bid.  He was net -2.4L yesterday.  # Acute on chronic renal failure: Improving with diuresis.    For questions or updates, please contact [facility] Please consult [email or web address] for contact info under Cardiology/STEMI.      Signed, Chilton Si, MD  07/21/2017, 11:00 AM

## 2017-07-21 NOTE — Progress Notes (Signed)
ANTICOAGULATION CONSULT NOTE - F/u Consult  Pharmacy Consult for heparin/apixaban dosing Indication: atrial fibrillation  No Known Allergies  Patient Measurements: Height: 6' (182.9 cm) Weight: 230 lb 11.2 oz (104.6 kg) IBW/kg (Calculated) : 77.6 Heparin Dosing Weight: 99.9    Vital Signs: Temp: 98.3 F (36.8 C) (10/20 0747) Temp Source: Oral (10/20 0747) BP: 132/100 (10/20 0433) Pulse Rate: 96 (10/20 0433)  Labs:  Recent Labs  07/18/17 1527  07/18/17 2309 07/19/17 0425 07/19/17 1013 07/20/17 0334 07/20/17 1326 07/21/17 0249  HGB 14.8  --   --  15.1  --  14.2  --   --   HCT 45.7  --   --  45.6  --  44.1  --   --   PLT 411*  --   --  370  --  411*  --   --   HEPARINUNFRC  --   < > 0.49 0.41  --  0.20* 0.39 0.18*  CREATININE 2.13*  --   --  2.15*  --  2.31*  --  1.79*  TROPONINI  --   --  0.03* 0.03* <0.03  --   --   --   < > = values in this interval not displayed.  Estimated Creatinine Clearance: 52.1 mL/min (A) (by C-G formula based on SCr of 1.79 mg/dL (H)).   Medical History: Past Medical History:  Diagnosis Date  . Dysrhythmia     Medications:  Scheduled:  . apixaban  5 mg Oral BID  . furosemide  80 mg Intravenous Q12H  . metoprolol succinate  50 mg Oral Daily   Assessment: 65 YOM admitted with SOB. History of afib, not on anticoagulation PTA. Was on heparin infusion; however, pharmacy consulted to transition to apixaban. Given age<80, weight>60 kg, and Scr>1.5, patient should receive 5 mg twice daily. CBC remains stable. No signs/symptoms of bleeding noted in chart.   Goal of Therapy:  Monitor platelets by anticoagulation protocol: Yes   Plan:  Discontinue heparin infusion  Start apixaban 5 mg twice daily at time of heparin infusion discontinuation Monitor for s/s bleeding, renal function, and CBC as indicated   Girard Cooter, PharmD Clinical Pharmacist  Phone: [phone] 07/21/17 9:16 AM

## 2017-07-22 DIAGNOSIS — I481 Persistent atrial fibrillation: Secondary | ICD-10-CM

## 2017-07-22 DIAGNOSIS — N183 Chronic kidney disease, stage 3 (moderate): Secondary | ICD-10-CM

## 2017-07-22 LAB — BASIC METABOLIC PANEL
Anion gap: 10 (ref 5–15)
BUN: 41 mg/dL — ABNORMAL HIGH (ref 6–20)
CHLORIDE: 105 mmol/L (ref 101–111)
CO2: 26 mmol/L (ref 22–32)
Calcium: 8.8 mg/dL — ABNORMAL LOW (ref 8.9–10.3)
Creatinine, Ser: 1.94 mg/dL — ABNORMAL HIGH (ref 0.61–1.24)
GFR calc non Af Amer: 35 mL/min — ABNORMAL LOW (ref >60)
GFR, EST AFRICAN AMERICAN: 40 mL/min — AB (ref >60)
Glucose, Bld: 115 mg/dL — ABNORMAL HIGH (ref 65–99)
POTASSIUM: 3.9 mmol/L (ref 3.5–5.1)
SODIUM: 141 mmol/L (ref 135–145)

## 2017-07-22 LAB — CBC
HEMATOCRIT: 46.7 % (ref 39.0–52.0)
HEMOGLOBIN: 15.4 g/dL (ref 13.0–17.0)
MCH: 28.2 pg (ref 26.0–34.0)
MCHC: 33 g/dL (ref 30.0–36.0)
MCV: 85.4 fL (ref 78.0–100.0)
Platelets: 385 10*3/uL (ref 150–400)
RBC: 5.47 MIL/uL (ref 4.22–5.81)
RDW: 14 % (ref 11.5–15.5)
WBC: 8.9 10*3/uL (ref 4.0–10.5)

## 2017-07-22 MED ORDER — METOPROLOL SUCCINATE ER 100 MG PO TB24: 100.0000 mg | ORAL_TABLET | Freq: Every day | ORAL | Status: DC

## 2017-07-22 MED ORDER — FUROSEMIDE 80 MG PO TABS
80.0000 mg | ORAL_TABLET | Freq: Every day | ORAL | Status: DC
  Administered 2017-07-22 – 2017-07-24 (×3): 80 mg via ORAL
  Filled 2017-07-22 (×3): qty 1

## 2017-07-22 NOTE — Progress Notes (Signed)
PROGRESS NOTE    Jeffery GreenhouseGary Christian  ZOX:[id number] DOB: 05-16-1951 DOA: 07/18/2017 PCP: Patient, No Pcp Per     Brief Narrative:  66 y/o her with SOB. Has h/o a fib; found to be in a fib with RVR.   Assessment & Plan:   Principal Problem:   Atrial fibrillation with RVR (HCC) Active Problems:   Acute CHF (congestive heart failure) (HCC)   History of gout   Shortness of breath   1. Atrial fibrillation with rapid ventricular response. Patient successfully weaned off IV diltiazem. Heart rate not optimally controlled, seen by cards who will increase Toprol-XL to 100 mg daily. Continue Eliquis for anticoagulation purposes.  2. Acute cardiogenic pulmonary edema due to acute on chronic systolic heart failure decompensation.Patient is net 3.6 L negative over night with 3900 mL of urine output. Seen by cardiology who plans to switch Lasix over to 80 mg daily, I agree. Volume status is improved although still has significant lower extremity edema. Follow up echocardiogram with reduced LV systolic function down to 35 to 40%, with diffuse hypokinesis, c/w non ischemic cardiomyopathy. Wonder if decrease in EF may be tachy mediated.   3. Chronic kidney disease stage IV. Baseline Cr appears to be around 1.9-2.3. Renal function is currently at baseline at 1.94.  4. History of gout. Stable with no signs of exacerbation.   DVT prophylaxis: Eliquis Code Status: Full Code Family Communication: Friend at bedside Disposition Plan: Hope for discharge home in 24-48 hours as well as volume status continues to improve and A. fib is rate controlled.  Consultants:   Cardiology  Procedures:  Echo: - When compared to the prior study from 03/23/2014 LVEF has   decreased from 55-60% to 35-40%. The LVEF evaluation might be   affected by atrial fibrillation with RVR during the acquisition.    Antimicrobials:  Anti-infectives    Start     Dose/Rate Route Frequency Ordered Stop   07/18/17 1915   cefTRIAXone (ROCEPHIN) 1 g in dextrose 5 % 50 mL IVPB     1 g 100 mL/hr over 30 Minutes Intravenous  Once 07/18/17 1903 07/18/17 2159   07/18/17 1915  azithromycin (ZITHROMAX) 500 mg in dextrose 5 % 250 mL IVPB     500 mg 250 mL/hr over 60 Minutes Intravenous  Once 07/18/17 1903 07/18/17 2159       Subjective: Has no complaints, anxious to be discharged home.  Objective: Vitals:   07/21/17 2015 07/21/17 2346 07/22/17 0640 07/22/17 0928  BP:  (!) 123/99 127/81 (!) 121/100  Pulse: 100 (!) 105 84 (!) 113  Resp: (!) 29 (!) 31 (!) 24   Temp:  98.1 F (36.7 C) 97.7 F (36.5 C)   TempSrc:  Oral Oral   SpO2:  93% 98%   Weight:   101.6 kg (224 lb)   Height:        Intake/Output Summary (Last 24 hours) at 07/22/17 1102 Last data filed at 07/21/17 2347  Gross per 24 hour  Intake                0 ml  Output             2700 ml  Net            -2700 ml   Filed Weights   07/20/17 0500 07/21/17 0433 07/22/17 0640  Weight: 107.9 kg (237 lb 14 oz) 104.6 kg (230 lb 11.2 oz) 101.6 kg (224 lb)    Examination:  General exam:  Alert, awake, oriented x 3 Respiratory system: Clear to auscultation. Respiratory effort normal. Cardiovascular system:RRR. No murmurs, rubs, gallops. Gastrointestinal system: Abdomen is nondistended, soft and nontender. No organomegaly or masses felt. Normal bowel sounds heard. Central nervous system: Alert and oriented. No focal neurological deficits. Extremities: 2+ edema up to mid calves bilaterally Skin: No rashes, lesions or ulcers Psychiatry: Judgement and insight appear normal. Mood & affect appropriate.       Data Reviewed: I have personally reviewed following labs and imaging studies  CBC:  Recent Labs Lab 07/18/17 1527 07/19/17 0425 07/20/17 0334 07/22/17 0415  WBC 11.2* 10.9* 10.5 8.9  NEUTROABS  --   --  7.4  --   HGB 14.8 15.1 14.2 15.4  HCT 45.7 45.6 44.1 46.7  MCV 86.7 86.4 86.3 85.4  PLT 411* 370 411* 385   Basic Metabolic  Panel:  Recent Labs Lab 07/18/17 1527 07/18/17 2309 07/19/17 0425 07/20/17 0334 07/21/17 0249 07/22/17 0415  NA 141  --  142 139 139 141  K 5.2*  --  4.3 4.2 3.7 3.9  CL 108  --  108 103 106 105  CO2 25  --  22 26 23 26   GLUCOSE 136*  --  90 115* 109* 115*  BUN 30*  --  35* 43* 42* 41*  CREATININE 2.13*  --  2.15* 2.31* 1.79* 1.94*  CALCIUM 9.0  --  8.9 8.9 8.7* 8.8*  MG  --  2.1  --   --   --   --    GFR: Estimated Creatinine Clearance: 47.4 mL/min (A) (by C-G formula based on SCr of 1.94 mg/dL (H)). Liver Function Tests:  Recent Labs Lab 07/18/17 1527  AST 33  ALT 38  ALKPHOS 65  BILITOT 0.5  PROT 6.3*  ALBUMIN 3.1*   No results for input(s): LIPASE, AMYLASE in the last 168 hours. No results for input(s): AMMONIA in the last 168 hours. Coagulation Profile: No results for input(s): INR, PROTIME in the last 168 hours. Cardiac Enzymes:  Recent Labs Lab 07/18/17 2309 07/19/17 0425 07/19/17 1013  TROPONINI 0.03* 0.03* <0.03   BNP (last 3 results) No results for input(s): PROBNP in the last 8760 hours. HbA1C: No results for input(s): HGBA1C in the last 72 hours. CBG: No results for input(s): GLUCAP in the last 168 hours. Lipid Profile: No results for input(s): CHOL, HDL, LDLCALC, TRIG, CHOLHDL, LDLDIRECT in the last 72 hours. Thyroid Function Tests: No results for input(s): TSH, T4TOTAL, FREET4, T3FREE, THYROIDAB in the last 72 hours. Anemia Panel: No results for input(s): VITAMINB12, FOLATE, FERRITIN, TIBC, IRON, RETICCTPCT in the last 72 hours. Urine analysis:    Component Value Date/Time   COLORURINE STRAW (A) 07/19/2017 0850   APPEARANCEUR CLEAR 07/19/2017 0850   LABSPEC 1.005 07/19/2017 0850   PHURINE 5.0 07/19/2017 0850   GLUCOSEU NEGATIVE 07/19/2017 0850   HGBUR NEGATIVE 07/19/2017 0850   BILIRUBINUR NEGATIVE 07/19/2017 0850   KETONESUR NEGATIVE 07/19/2017 0850   PROTEINUR NEGATIVE 07/19/2017 0850   NITRITE NEGATIVE 07/19/2017 0850    LEUKOCYTESUR NEGATIVE 07/19/2017 0850   Sepsis Labs: @LABRCNTIP (procalcitonin:4,lacticidven:4)  ) Recent Results (from the past 240 hour(s))  Culture, blood (routine x 2)     Status: None (Preliminary result)   Collection Time: 07/18/17  7:40 PM  Result Value Ref Range Status   Specimen Description BLOOD RIGHT HAND  Final   Special Requests   Final    BOTTLES DRAWN AEROBIC AND ANAEROBIC Blood Culture adequate volume   Culture  NO GROWTH 3 DAYS  Final   Report Status PENDING  Incomplete  Culture, blood (routine x 2)     Status: None (Preliminary result)   Collection Time: 07/18/17  7:43 PM  Result Value Ref Range Status   Specimen Description BLOOD LEFT HAND  Final   Special Requests   Final    BOTTLES DRAWN AEROBIC AND ANAEROBIC Blood Culture adequate volume   Culture NO GROWTH 3 DAYS  Final   Report Status PENDING  Incomplete  MRSA PCR Screening     Status: None   Collection Time: 07/18/17 10:57 PM  Result Value Ref Range Status   MRSA by PCR NEGATIVE NEGATIVE Final    Comment:        The GeneXpert MRSA Assay (FDA approved for NASAL specimens only), is one component of a comprehensive MRSA colonization surveillance program. It is not intended to diagnose MRSA infection nor to guide or monitor treatment for MRSA infections.          Radiology Studies: No results found.      Scheduled Meds: . apixaban  5 mg Oral BID  . furosemide  80 mg Oral Daily  . [START ON 07/23/2017] metoprolol succinate  100 mg Oral Daily   Continuous Infusions:    LOS: 4 days    Time spent: 35 minutes. Greater than 50% of this time was spent in direct contact with the patient coordinating care.     Chaya Jan, MD [facility] Pager [phone]  If 7PM-7AM, please contact night-coverage [email or web address] Password [email or web address] 07/22/2017, 11:02 AM

## 2017-07-22 NOTE — Progress Notes (Signed)
Pharmacist Heart Failure Core Measure Documentation  Assessment: Jeffery Christian has an EF documented as 35% byECHO.  Rationale: Heart failure patients with left ventricular systolic dysfunction (LVSD) and an EF < 40% should be prescribed an angiotensin converting enzyme inhibitor (ACEI) or angiotensin receptor blocker (ARB) at discharge unless a contraindication is documented in the medical record.  This patient is not currently on an ACEI or ARB for HF.  This note is being placed in the record in order to provide documentation that a contraindication to the use of these agents is present for this encounter.  ACE Inhibitor or Angiotensin Receptor Blocker is contraindicated (specify all that apply)  []   ACEI allergy AND ARB allergy []   Angioedema []   Moderate or severe aortic stenosis []   Hyperkalemia [x]   Hypotension []   Renal artery stenosis [x]   Worsening renal function, preexisting renal disease or dysfunction   Leota Sauers Pharm.D. CPP, BCPS Clinical Pharmacist [phone] 07/22/2017 2:47 PM

## 2017-07-22 NOTE — Progress Notes (Signed)
Progress Note  Patient Name: Susanne GreenhouseGary Borton Date of Encounter: 07/22/2017  Primary Cardiologist: Dr. Dietrich PatesPaula Ross  Subjective   Less short of breath. Complains about frequent urination. Wants to go home. No chest pain. Leg swelling better.  Inpatient Medications    Scheduled Meds: . apixaban  5 mg Oral BID  . furosemide  80 mg Intravenous Q12H  . metoprolol succinate  75 mg Oral Daily    PRN Meds: acetaminophen **OR** acetaminophen, Influenza vac split quadrivalent PF, ipratropium-albuterol, menthol-cetylpyridinium, ondansetron **OR** ondansetron (ZOFRAN) IV   Vital Signs    Vitals:   07/21/17 2015 07/21/17 2346 07/22/17 0640 07/22/17 0928  BP:  (!) 123/99 127/81 (!) 121/100  Pulse: 100 (!) 105 84 (!) 113  Resp: (!) 29 (!) 31 (!) 24   Temp:  98.1 F (36.7 C) 97.7 F (36.5 C)   TempSrc:  Oral Oral   SpO2:  93% 98%   Weight:   224 lb (101.6 kg)   Height:        Intake/Output Summary (Last 24 hours) at 07/22/17 1003 Last data filed at 07/21/17 2347  Gross per 24 hour  Intake                0 ml  Output             2700 ml  Net            -2700 ml   Filed Weights   07/20/17 0500 07/21/17 0433 07/22/17 0640  Weight: 237 lb 14 oz (107.9 kg) 230 lb 11.2 oz (104.6 kg) 224 lb (101.6 kg)    Telemetry    Atrial fibrillation. Personally reviewed.  Physical Exam   GEN: No acute distress.   Neck: No JVD. Cardiac: Irregularly irregular, no gallop.  Respiratory: Nonlabored. Clear to auscultation bilaterally. GI: Soft, nontender, bowel sounds present. MS: 2+ leg edema; No deformity. Neuro:  Nonfocal. Psych: Alert and oriented x 3. Normal affect.  Labs    Chemistry Recent Labs Lab 07/18/17 1527  07/20/17 0334 07/21/17 0249 07/22/17 0415  NA 141  < > 139 139 141  K 5.2*  < > 4.2 3.7 3.9  CL 108  < > 103 106 105  CO2 25  < > 26 23 26   GLUCOSE 136*  < > 115* 109* 115*  BUN 30*  < > 43* 42* 41*  CREATININE 2.13*  < > 2.31* 1.79* 1.94*  CALCIUM 9.0  < > 8.9 8.7*  8.8*  PROT 6.3*  --   --   --   --   ALBUMIN 3.1*  --   --   --   --   AST 33  --   --   --   --   ALT 38  --   --   --   --   ALKPHOS 65  --   --   --   --   BILITOT 0.5  --   --   --   --   GFRNONAA 31*  < > 28* 38* 35*  GFRAA 36*  < > 33* 44* 40*  ANIONGAP 8  < > 10 10 10   < > = values in this interval not displayed.   Hematology Recent Labs Lab 07/19/17 0425 07/20/17 0334 07/22/17 0415  WBC 10.9* 10.5 8.9  RBC 5.28 5.11 5.47  HGB 15.1 14.2 15.4  HCT 45.6 44.1 46.7  MCV 86.4 86.3 85.4  MCH 28.6 27.8 28.2  MCHC 33.1 32.2  33.0  RDW 14.2 14.3 14.0  PLT 370 411* 385    Cardiac Enzymes Recent Labs Lab 07/18/17 2309 07/19/17 0425 07/19/17 1013  TROPONINI 0.03* 0.03* <0.03    Recent Labs Lab 07/18/17 1546  TROPIPOC 0.04     BNP Recent Labs Lab 07/18/17 1629  BNP 845.9*     Radiology    No results found.  Cardiac Studies   Echocardiogram 07/22/2017: Study Conclusions  - Left ventricle: The cavity size was normal. Systolic function was   moderately reduced. The estimated ejection fraction was in the   range of 35% to 40%. Diffuse hypokinesis. The study was not   technically sufficient to allow evaluation of LV diastolic   dysfunction due to atrial fibrillation. - Mitral valve: There was mild regurgitation. - Left atrium: The atrium was moderately dilated. - Right ventricle: Systolic function was normal. - Right atrium: The atrium was mildly dilated. - Pulmonary arteries: Systolic pressure was moderately increased.   PA peak pressure: 44 mm Hg (S). - Inferior vena cava: The vessel was dilated. The respirophasic   diameter changes were blunted (< 50%), consistent with elevated   central venous pressure. - Pericardium, extracardiac: There was no pericardial effusion.   There was a left pleural effusion.  Impressions:  - When compared to the prior study from 03/23/2014 LVEF has   decreased from 55-60% to 35-40%. The LVEF evaluation might be    affected by atrial fibrillation with RVR during the acquisition.  Patient Profile     66 y.o. male with PAF, CKD stage 3, and acute combined heart failure with LVEF 35-40%.  Assessment & Plan    1. Persistent atrial fibrillation. Heart rate is not optimally controlled, currently on Toprol-XL 75 mg daily and Eliquis.  2. Acute on chronic renal insufficiency, CKD stage 3 at baseline. Creatinine 1.9.  3. Acute on chronic combined heart failure, LVEF 35-40%y recent echocardiogram. Tachycardia-mediated process is to be considered.  4. History of gout.  Continue Eliquis. Increase Toprol-XL to 100 mg daily. Convert from IV Lasix to Lasix 80 mg by mouth daily. Depending on blood pressure trend, would next add combination of hydralazine and nitrates for afterload reduction, not on ACE inhibitor or ARB due to renal disease. Depending on heart rate control, may be able to be discharged the next 24-48 hours. He will need close outpatient follow-up and ultimately consideration of cardioversion.  Signed, Nona Dell, MD  07/22/2017, 10:03 AM  \

## 2017-07-23 LAB — BASIC METABOLIC PANEL
ANION GAP: 11 (ref 5–15)
BUN: 35 mg/dL — ABNORMAL HIGH (ref 6–20)
CHLORIDE: 103 mmol/L (ref 101–111)
CO2: 28 mmol/L (ref 22–32)
CREATININE: 1.91 mg/dL — AB (ref 0.61–1.24)
Calcium: 9.1 mg/dL (ref 8.9–10.3)
GFR calc non Af Amer: 35 mL/min — ABNORMAL LOW (ref >60)
GFR, EST AFRICAN AMERICAN: 41 mL/min — AB (ref >60)
Glucose, Bld: 92 mg/dL (ref 65–99)
POTASSIUM: 4.1 mmol/L (ref 3.5–5.1)
SODIUM: 142 mmol/L (ref 135–145)

## 2017-07-23 LAB — CULTURE, BLOOD (ROUTINE X 2)
Culture: NO GROWTH
Culture: NO GROWTH
Special Requests: ADEQUATE
Special Requests: ADEQUATE

## 2017-07-23 MED ORDER — METOPROLOL SUCCINATE ER 100 MG PO TB24
150.0000 mg | ORAL_TABLET | Freq: Every day | ORAL | Status: DC
  Administered 2017-07-23 – 2017-07-24 (×2): 150 mg via ORAL
  Filled 2017-07-23 (×2): qty 1

## 2017-07-23 NOTE — Progress Notes (Signed)
Progress Note  Patient Name: Jeffery GreenhouseGary Christian Date of Encounter: 07/23/2017  Primary Cardiologist: Tenny Crawoss  (seen in consult once in 2015)    Subjective   Breathing is OK  No CP  Eager to go home   Inpatient Medications    Scheduled Meds: . apixaban  5 mg Oral BID  . furosemide  80 mg Oral Daily  . metoprolol succinate  100 mg Oral Daily   Continuous Infusions:  PRN Meds: acetaminophen **OR** acetaminophen, Influenza vac split quadrivalent PF, ipratropium-albuterol, menthol-cetylpyridinium, ondansetron **OR** ondansetron (ZOFRAN) IV   Vital Signs    Vitals:   07/22/17 0928 07/22/17 1430 07/22/17 1941 07/23/17 0419  BP: (!) 121/100 118/89 (!) 140/101 (!) 136/97  Pulse: (!) 113 85 64 75  Resp:  18 18 20   Temp:  97.8 F (36.6 C) 98.3 F (36.8 C) 98 F (36.7 C)  TempSrc:  Oral Oral Oral  SpO2:  100% 96% 95%  Weight:    220 lb (99.8 kg)  Height:        Intake/Output Summary (Last 24 hours) at 07/23/17 0818 Last data filed at 07/23/17 0530  Gross per 24 hour  Intake                0 ml  Output              900 ml  Net             -900 ml   Filed Weights   07/21/17 0433 07/22/17 0640 07/23/17 0419  Weight: 230 lb 11.2 oz (104.6 kg) 224 lb (101.6 kg) 220 lb (99.8 kg)    Telemetry    Atrial fib   80s to 130  - Personally Reviewed  ECG      Physical Exam   GEN: No acute distress.   Neck: No JVD Cardiac: Irreg irreg   no murmurs, rubs, or gallops.  Respiratory:=  Rales at L base   GI: Soft, nontender, non-distended  MS: No edema; No deformity. Neuro:  Nonfocal  Psych: Normal affect   Labs    Chemistry Recent Labs Lab 07/18/17 1527  07/21/17 0249 07/22/17 0415 07/23/17 0306  NA 141  < > 139 141 142  K 5.2*  < > 3.7 3.9 4.1  CL 108  < > 106 105 103  CO2 25  < > 23 26 28   GLUCOSE 136*  < > 109* 115* 92  BUN 30*  < > 42* 41* 35*  CREATININE 2.13*  < > 1.79* 1.94* 1.91*  CALCIUM 9.0  < > 8.7* 8.8* 9.1  PROT 6.3*  --   --   --   --   ALBUMIN 3.1*  --    --   --   --   AST 33  --   --   --   --   ALT 38  --   --   --   --   ALKPHOS 65  --   --   --   --   BILITOT 0.5  --   --   --   --   GFRNONAA 31*  < > 38* 35* 35*  GFRAA 36*  < > 44* 40* 41*  ANIONGAP 8  < > 10 10 11   < > = values in this interval not displayed.   Hematology Recent Labs Lab 07/19/17 0425 07/20/17 0334 07/22/17 0415  WBC 10.9* 10.5 8.9  RBC 5.28 5.11 5.47  HGB 15.1 14.2 15.4  HCT 45.6 44.1 46.7  MCV 86.4 86.3 85.4  MCH 28.6 27.8 28.2  MCHC 33.1 32.2 33.0  RDW 14.2 14.3 14.0  PLT 370 411* 385    Cardiac Enzymes Recent Labs Lab 07/18/17 2309 07/19/17 0425 07/19/17 1013  TROPONINI 0.03* 0.03* <0.03    Recent Labs Lab 07/18/17 1546  TROPIPOC 0.04     BNP Recent Labs Lab 07/18/17 1629  BNP 845.9*     DDimer No results for input(s): DDIMER in the last 168 hours.   Radiology    No results found.  Cardiac Studies     Patient Profile     66 y.o. male with atrial fibrillation, CHF and CKD    Assessment & Plan   1  Persistent atrial fib     Rates are still elevated   120 talking to me while sitting   I would increase toprol XL to 150 per day  Folllw BP and HR  Possible D/C when controlled    2  Acute on chronic systolic and diastolic CHF  VLEF 35 to 40%  ? Etiology  Possibly tachy induced  Right now plan for HR control  If BP still up can add hydralazine and NTGH  Wll need f/u echo when controlled  \  3  Renal insuff    Cr 1.91  Reviewed salt intake   Plan for HR control with cardioversion (pt concerned)  I explained that extremely safe procedure Will be done in about 4 wks    For questions or updates, please contact [facility] Please consult [email or web address] for contact info under Cardiology/STEMI.      Signed, Dietrich Pates, MD  07/23/2017, 8:18 AM

## 2017-07-23 NOTE — Progress Notes (Signed)
PROGRESS NOTE    Jeffery Christian  RVU:[id number] DOB: Nov 25, 1950 DOA: 07/18/2017 PCP: Patient, No Pcp Per     Brief Narrative:  66 y/o her with SOB. Has h/o a fib; found to be in a fib with RVR.  Also has been volume overloaded due to acute on chronic systolic heart failure.   Assessment & Plan:   Principal Problem:   Atrial fibrillation with RVR (HCC) Active Problems:   Acute CHF (congestive heart failure) (HCC)   History of gout   Shortness of breath   1. Atrial fibrillation with rapid ventricular response. Patient successfully weaned off IV diltiazem. Heart rate not optimally controlled, seen by cards who will again increase Toprol-XL to 150 mg daily. Continue Eliquis for anticoagulation purposes.  2. Acute cardiogenic pulmonary edema due to acute on chronic systolic heart failure decompensation.Patient is net 900 cc negative over night with 3900 mL of urine output.  Continue Lasix 80 mg daily orally . Volume status is improved although still has significant lower extremity edema. Follow up echocardiogram with reduced LV systolic function down to 35 to 40%, with diffuse hypokinesis, c/w non ischemic cardiomyopathy. Wonder if decrease in EF may be tachy mediated.   3. Chronic kidney disease stage IV. Baseline Cr appears to be around 1.9-2.3. Renal function is currently at baseline at 1.91.  4. History of gout. Stable with no signs of exacerbation.   DVT prophylaxis: Eliquis Code Status: Full Code Family Communication: Friend at bedside Disposition Plan: Hope for discharge home in 24-48 hours as well as volume status continues to improve and A. fib is rate controlled.  Consultants:   Cardiology  Procedures:  Echo: - When compared to the prior study from 03/23/2014 LVEF has   decreased from 55-60% to 35-40%. The LVEF evaluation might be   affected by atrial fibrillation with RVR during the acquisition.    Antimicrobials:  Anti-infectives    Start     Dose/Rate Route  Frequency Ordered Stop   07/18/17 1915  cefTRIAXone (ROCEPHIN) 1 g in dextrose 5 % 50 mL IVPB     1 g 100 mL/hr over 30 Minutes Intravenous  Once 07/18/17 1903 07/18/17 2159   07/18/17 1915  azithromycin (ZITHROMAX) 500 mg in dextrose 5 % 250 mL IVPB     500 mg 250 mL/hr over 60 Minutes Intravenous  Once 07/18/17 1903 07/18/17 2159       Subjective: Denies chest pain, palpitations; he has no complaints, is anxious for discharge home.  Objective: Vitals:   07/22/17 1941 07/23/17 0419 07/23/17 1051 07/23/17 1352  BP: (!) 140/101 (!) 136/97 (!) 125/100 (!) 116/91  Pulse: 64 75 (!) 103   Resp: 18 20    Temp: 98.3 F (36.8 C) 98 F (36.7 C)  97.8 F (36.6 C)  TempSrc: Oral Oral  Oral  SpO2: 96% 95%    Weight:  99.8 kg (220 lb)    Height:        Intake/Output Summary (Last 24 hours) at 07/23/17 1549 Last data filed at 07/23/17 1300  Gross per 24 hour  Intake               50 ml  Output              900 ml  Net             -850 ml   Filed Weights   07/21/17 0433 07/22/17 0640 07/23/17 0419  Weight: 104.6 kg (230 lb 11.2 oz) 101.6 kg (  224 lb) 99.8 kg (220 lb)    Examination:  General exam: Alert, awake, oriented x 3 Respiratory system: Clear to auscultation. Respiratory effort normal. Cardiovascular system:tachy, irregular Gastrointestinal system: Abdomen is nondistended, soft and nontender. No organomegaly or masses felt. Normal bowel sounds heard. Central nervous system: Alert and oriented. No focal neurological deficits. Extremities: No C/C/E, +pedal pulses Skin: No rashes, lesions or ulcers Psychiatry: Judgement and insight appear normal. Mood & affect appropriate.      Data Reviewed: I have personally reviewed following labs and imaging studies  CBC:  Recent Labs Lab 07/18/17 1527 07/19/17 0425 07/20/17 0334 07/22/17 0415  WBC 11.2* 10.9* 10.5 8.9  NEUTROABS  --   --  7.4  --   HGB 14.8 15.1 14.2 15.4  HCT 45.7 45.6 44.1 46.7  MCV 86.7 86.4 86.3 85.4   PLT 411* 370 411* 385   Basic Metabolic Panel:  Recent Labs Lab 07/18/17 2309 07/19/17 0425 07/20/17 0334 07/21/17 0249 07/22/17 0415 07/23/17 0306  NA  --  142 139 139 141 142  K  --  4.3 4.2 3.7 3.9 4.1  CL  --  108 103 106 105 103  CO2  --  22 26 23 26 28   GLUCOSE  --  90 115* 109* 115* 92  BUN  --  35* 43* 42* 41* 35*  CREATININE  --  2.15* 2.31* 1.79* 1.94* 1.91*  CALCIUM  --  8.9 8.9 8.7* 8.8* 9.1  MG 2.1  --   --   --   --   --    GFR: Estimated Creatinine Clearance: 47.8 mL/min (A) (by C-G formula based on SCr of 1.91 mg/dL (H)). Liver Function Tests:  Recent Labs Lab 07/18/17 1527  AST 33  ALT 38  ALKPHOS 65  BILITOT 0.5  PROT 6.3*  ALBUMIN 3.1*   No results for input(s): LIPASE, AMYLASE in the last 168 hours. No results for input(s): AMMONIA in the last 168 hours. Coagulation Profile: No results for input(s): INR, PROTIME in the last 168 hours. Cardiac Enzymes:  Recent Labs Lab 07/18/17 2309 07/19/17 0425 07/19/17 1013  TROPONINI 0.03* 0.03* <0.03   BNP (last 3 results) No results for input(s): PROBNP in the last 8760 hours. HbA1C: No results for input(s): HGBA1C in the last 72 hours. CBG: No results for input(s): GLUCAP in the last 168 hours. Lipid Profile: No results for input(s): CHOL, HDL, LDLCALC, TRIG, CHOLHDL, LDLDIRECT in the last 72 hours. Thyroid Function Tests: No results for input(s): TSH, T4TOTAL, FREET4, T3FREE, THYROIDAB in the last 72 hours. Anemia Panel: No results for input(s): VITAMINB12, FOLATE, FERRITIN, TIBC, IRON, RETICCTPCT in the last 72 hours. Urine analysis:    Component Value Date/Time   COLORURINE STRAW (A) 07/19/2017 0850   APPEARANCEUR CLEAR 07/19/2017 0850   LABSPEC 1.005 07/19/2017 0850   PHURINE 5.0 07/19/2017 0850   GLUCOSEU NEGATIVE 07/19/2017 0850   HGBUR NEGATIVE 07/19/2017 0850   BILIRUBINUR NEGATIVE 07/19/2017 0850   KETONESUR NEGATIVE 07/19/2017 0850   PROTEINUR NEGATIVE 07/19/2017 0850    NITRITE NEGATIVE 07/19/2017 0850   LEUKOCYTESUR NEGATIVE 07/19/2017 0850   Sepsis Labs: @LABRCNTIP (procalcitonin:4,lacticidven:4)  ) Recent Results (from the past 240 hour(s))  Culture, blood (routine x 2)     Status: None   Collection Time: 07/18/17  7:40 PM  Result Value Ref Range Status   Specimen Description BLOOD RIGHT HAND  Final   Special Requests   Final    BOTTLES DRAWN AEROBIC AND ANAEROBIC Blood Culture adequate  volume   Culture NO GROWTH 5 DAYS  Final   Report Status 07/23/2017 FINAL  Final  Culture, blood (routine x 2)     Status: None   Collection Time: 07/18/17  7:43 PM  Result Value Ref Range Status   Specimen Description BLOOD LEFT HAND  Final   Special Requests   Final    BOTTLES DRAWN AEROBIC AND ANAEROBIC Blood Culture adequate volume   Culture NO GROWTH 5 DAYS  Final   Report Status 07/23/2017 FINAL  Final  MRSA PCR Screening     Status: None   Collection Time: 07/18/17 10:57 PM  Result Value Ref Range Status   MRSA by PCR NEGATIVE NEGATIVE Final    Comment:        The GeneXpert MRSA Assay (FDA approved for NASAL specimens only), is one component of a comprehensive MRSA colonization surveillance program. It is not intended to diagnose MRSA infection nor to guide or monitor treatment for MRSA infections.          Radiology Studies: No results found.      Scheduled Meds: . apixaban  5 mg Oral BID  . furosemide  80 mg Oral Daily  . metoprolol succinate  150 mg Oral Daily   Continuous Infusions:    LOS: 5 days    Time spent: 35 minutes. Greater than 50% of this time was spent in direct contact with the patient coordinating care.     Chaya Jan, MD [facility] Pager [phone]  If 7PM-7AM, please contact night-coverage [email or web address] Password [facility] 07/23/2017, 3:49 PM

## 2017-07-23 NOTE — Care Management Note (Addendum)
Case Management Note  Patient Details  Name: Jeffery Christian MRN: [id number] Date of Birth: 03/02/1951  Subjective/Objective:  Pt presented as a transfer from unit 2C. Presented for dyspnea and persistent Atrial Fib. Plan for home on Eliquis once stable. Pt is without PCP and Insurance. 30 day free eliquis card provided. Once established at the clinic the patient can be enrolled in the PAS Program for assistance via the company for E;iquis.              Action/Plan: CM did call the [facility] to schedule a hospital follow up visit for patient. Pt will be able to utilize the [facility] and medications will range from $4.00-$10.00. Pt is aware- Appointment. No further needs from CM at this time.   Expected Discharge Date:                  Expected Discharge Plan:  Home/Self Care  In-House Referral:  NA  Discharge planning Services  CM Consult, Medication Assistance, Follow-up appt scheduled, [facility]  Post Acute Care Choice:  NA Choice offered to:  NA  DME Arranged:  N/A DME Agency:  NA  HH Arranged:  NA HH Agency:  NA  Status of Service:  Completed, signed off  If discussed at Long Length of Stay Meetings, dates discussed:    Additional Comments: 1033 07-24-17 Tomi Bamberger, RN,BSN [phone] CM was able to speak with pt- he has a scale at home to weigh self daily. Pt is not homebound so he will not qualify for [facility] RN for Disease Management. Pt is aware to go to the clinic after d/c for medications. No further needs from CM at this time.  Gala Lewandowsky, RN 07/23/2017, 10:22 AM

## 2017-07-24 ENCOUNTER — Telehealth: Payer: Self-pay | Admitting: Internal Medicine

## 2017-07-24 LAB — BASIC METABOLIC PANEL
Anion gap: 10 (ref 5–15)
BUN: 31 mg/dL — AB (ref 6–20)
CO2: 28 mmol/L (ref 22–32)
Calcium: 9.2 mg/dL (ref 8.9–10.3)
Chloride: 104 mmol/L (ref 101–111)
Creatinine, Ser: 1.65 mg/dL — ABNORMAL HIGH (ref 0.61–1.24)
GFR calc Af Amer: 49 mL/min — ABNORMAL LOW (ref >60)
GFR, EST NON AFRICAN AMERICAN: 42 mL/min — AB (ref >60)
GLUCOSE: 118 mg/dL — AB (ref 65–99)
POTASSIUM: 4 mmol/L (ref 3.5–5.1)
Sodium: 142 mmol/L (ref 135–145)

## 2017-07-24 MED ORDER — FUROSEMIDE 80 MG PO TABS: 80.0000 mg | ORAL_TABLET | Freq: Every day | ORAL | 2 refills | Status: DC

## 2017-07-24 MED ORDER — METOPROLOL SUCCINATE ER 50 MG PO TB24: 150.0000 mg | ORAL_TABLET | Freq: Every day | ORAL | 2 refills | Status: DC

## 2017-07-24 MED ORDER — APIXABAN 5 MG PO TABS: 5.0000 mg | ORAL_TABLET | Freq: Two times a day (BID) | ORAL | 2 refills | Status: DC

## 2017-07-24 MED FILL — ELIQUIS 5 MG TABLET: 5 | 30 days supply | Qty: 60 | Fill #0 | Status: TO

## 2017-07-24 MED FILL — ?FUROSEMIDE 80MG TABLET: 80 | 30 days supply | Qty: 30 | Fill #0 | Status: TO

## 2017-07-24 MED FILL — METOPROLOL SUCC ER 50 MG TA: 50 | 30 days supply | Qty: 90 | Fill #0 | Status: TO

## 2017-07-24 NOTE — Telephone Encounter (Signed)
Pt released from hospital late today. Will call tomorrow.

## 2017-07-24 NOTE — Discharge Summary (Signed)
Physician Discharge Summary  Jeffery GreenhouseGary Christian VHQ:[id number] DOB: [birth year] DOA: 07/18/2017  PCP: [facility]  Admit date: 07/18/2017 Discharge date: 07/24/2017  Time spent: 45 minutes  Recommendations for Outpatient Follow-up:  -Will be discharged home today. -[facility]. -Cardiology will also arrange follow up and patient will be notified of date and time of appointment.  Discharge Diagnoses:  Principal Problem:   Atrial fibrillation with RVR (HCC) Active Problems:   Acute CHF (congestive heart failure) (HCC)   History of gout   Shortness of breath   Discharge Condition: Stable and improved  Filed Weights   07/22/17 0640 07/23/17 0419 07/24/17 0500  Weight: 101.6 kg (224 lb) 99.8 kg (220 lb) 97.6 kg (215 lb 3.2 oz)    History of present illness:  As per Dr. Toniann FailKakrakandy on 10/17: Jeffery GreenhouseGary Christian is a 66 y.o. male with previous history of paroxysmal atrial fibrillation in the setting of sepsis in 2015 and since then has not been to a doctor presents to the ER with complaints of shortness of breath.patient's symptoms started 2 weeks ago with shortness of breath which has progressively increased. Patient's shortness of breath to worsens on lying down. Patient states he is unable to lie down last few days. Also has been having some productive cough. Denies any chest pain. Denies any fever or chills. Over the same period of time patient has noticed increasing lower extremity edema. Patient had gone to an urgent care center 2 days ago and was prescribed antibiotics for pneumonia. Despite taking which patient was still short of breath and came to the ER.  ED Course: in the ER patient is in A. Fib with RVR and chest x-ray shows congestion and features concerning for pulmonary edema versus opacities. Patient was placed on Cardizem infusion after bolus was given. Patient also was given Lasix 20  milligrams IV for pulmonary edema. Empirically started on antibiotics for possible pneumonia. Heparin for A. Fib.   Hospital Course:   1. Atrial fibrillation with rapid ventricular response. Patient successfully weaned off IV diltiazem. Heart rate with improved control (70-90s) now onToprol-XL150 mg daily. Continue Eliquis for anticoagulation purposes.  2. Acute cardiogenic pulmonary edema due to acute on chronic systolicheart failure decompensation.Patient is net 8 L negative since admission.  Continue Lasix 80 mg daily orally . Volume status is improved although still has some edema over his ankles. He is wanting to be discharged today. Follow up echocardiogram with reduced LV systolic function down to 35 to 40%, with diffuse hypokinesis, c/w non ischemic cardiomyopathy. Wonder if decrease in EF may be tachy mediated. Will follow up with cardiology as well.  3. Chronic kidney disease stage IV. Baseline Cr appears to be around 1.9-2.3. Renal function is currently better than baseline at 1.91.  4. History of gout. Stable with no signs of exacerbation.  Procedures:  As above   Consultations:  Cardiology  Discharge Instructions  Discharge Instructions    Amb referral to [facility]IB Clinic    Complete by:  As directed    Diet - low sodium heart healthy    Complete by:  As directed    Increase activity slowly    Complete by:  As directed      Allergies as of 07/24/2017   No Known Allergies     Medication List    STOP taking these medications   ALKA-SELTZER PLUS-D SINUS/COLD PO   allopurinol 100 MG  tablet Commonly known as:  ZYLOPRIM   benzonatate 100 MG capsule Commonly known as:  TESSALON   doxycycline 100 MG capsule Commonly known as:  VIBRAMYCIN   PROAIR HFA 108 (90 Base) MCG/ACT inhaler Generic drug:  albuterol     TAKE these medications   apixaban 5 MG Tabs tablet Commonly known as:  ELIQUIS Take 1 tablet (5 mg total) by mouth 2 (two) times daily.   aspirin  EC 325 MG tablet Take 325 mg by mouth every 8 (eight) hours as needed (for pain or headaches).   furosemide 80 MG tablet Commonly known as:  LASIX Take 1 tablet (80 mg total) by mouth daily.   metoprolol succinate 50 MG 24 hr tablet Commonly known as:  TOPROL-XL Take 3 tablets (150 mg total) by mouth daily.      No Known Allergies Follow-up Information    [facility] Follow up on 07/31/2017.   Why:  @ 9:00 am for hospital follow up appointment. Pt can utilize the pharmacy at this location for medications that will range in cost from $4.00-$10.00.  Contact information: [address] [place] [address] [phone]           The results of significant diagnostics from this hospitalization (including imaging, microbiology, ancillary and laboratory) are listed below for reference.    Significant Diagnostic Studies: Dg Chest Port 1 View  Result Date: 07/19/2017 CLINICAL DATA:  Shortness of breath EXAM: PORTABLE CHEST 1 VIEW COMPARISON:  07/18/2017 FINDINGS: Cardiomegaly with central vascular congestion and worsened bilateral interstitial and alveolar opacity. Increased small bilateral effusions. No pneumothorax. IMPRESSION: Cardiomegaly with worsening vascular congestion and bilateral airspace disease suggesting pulmonary edema or diffuse pneumonia. Increasing pleural effusions. Electronically Signed   By: Jasmine Pang M.D.   On: 07/19/2017 18:32   Dg Chest Port 1 View  Result Date: 07/18/2017 CLINICAL DATA:  Cough for 2 weeks EXAM: PORTABLE CHEST 1 VIEW COMPARISON:  None. FINDINGS: Cardiomegaly. Asymmetric streaky opacities in the left mid lung. Small bilateral pleural effusions. No pneumothorax. IMPRESSION: Cardiomegaly and suspected small bilateral pleural effusions with asymmetric left mid lung opacities. This may indicate pneumonia or asymmetric pulmonary edema. Electronically Signed   By: Deatra Robinson M.D.   On: 07/18/2017  17:25    Microbiology: Recent Results (from the past 240 hour(s))  Culture, blood (routine x 2)     Status: None   Collection Time: 07/18/17  7:40 PM  Result Value Ref Range Status   Specimen Description BLOOD RIGHT HAND  Final   Special Requests   Final    BOTTLES DRAWN AEROBIC AND ANAEROBIC Blood Culture adequate volume   Culture NO GROWTH 5 DAYS  Final   Report Status 07/23/2017 FINAL  Final  Culture, blood (routine x 2)     Status: None   Collection Time: 07/18/17  7:43 PM  Result Value Ref Range Status   Specimen Description BLOOD LEFT HAND  Final   Special Requests   Final    BOTTLES DRAWN AEROBIC AND ANAEROBIC Blood Culture adequate volume   Culture NO GROWTH 5 DAYS  Final   Report Status 07/23/2017 FINAL  Final  MRSA PCR Screening     Status: None   Collection Time: 07/18/17 10:57 PM  Result Value Ref Range Status   MRSA by PCR NEGATIVE NEGATIVE Final    Comment:        The GeneXpert MRSA Assay (FDA approved for NASAL specimens only), is one component of  a comprehensive MRSA colonization surveillance program. It is not intended to diagnose MRSA infection nor to guide or monitor treatment for MRSA infections.      Labs: Basic Metabolic Panel:  Recent Labs Lab 07/18/17 2309  07/20/17 0334 07/21/17 0249 07/22/17 0415 07/23/17 0306 07/24/17 0356  NA  --   < > 139 139 141 142 142  K  --   < > 4.2 3.7 3.9 4.1 4.0  CL  --   < > 103 106 105 103 104  CO2  --   < > 26 23 26 28 28   GLUCOSE  --   < > 115* 109* 115* 92 118*  BUN  --   < > 43* 42* 41* 35* 31*  CREATININE  --   < > 2.31* 1.79* 1.94* 1.91* 1.65*  CALCIUM  --   < > 8.9 8.7* 8.8* 9.1 9.2  MG 2.1  --   --   --   --   --   --   < > = values in this interval not displayed. Liver Function Tests:  Recent Labs Lab 07/18/17 1527  AST 33  ALT 38  ALKPHOS 65  BILITOT 0.5  PROT 6.3*  ALBUMIN 3.1*   No results for input(s): LIPASE, AMYLASE in the last 168 hours. No results for input(s): AMMONIA in  the last 168 hours. CBC:  Recent Labs Lab 07/18/17 1527 07/19/17 0425 07/20/17 0334 07/22/17 0415  WBC 11.2* 10.9* 10.5 8.9  NEUTROABS  --   --  7.4  --   HGB 14.8 15.1 14.2 15.4  HCT 45.7 45.6 44.1 46.7  MCV 86.7 86.4 86.3 85.4  PLT 411* 370 411* 385   Cardiac Enzymes:  Recent Labs Lab 07/18/17 2309 07/19/17 0425 07/19/17 1013  TROPONINI 0.03* 0.03* <0.03   BNP: BNP (last 3 results)  Recent Labs  07/18/17 1629  BNP 845.9*    ProBNP (last 3 results) No results for input(s): PROBNP in the last 8760 hours.  CBG: No results for input(s): GLUCAP in the last 168 hours.     SignedChaya Jan  [facility] Pager: [phone] 07/24/2017, 10:27 AM

## 2017-07-24 NOTE — Progress Notes (Signed)
Progress Note  Patient Name: Jeffery Christian Date of Encounter: 07/24/2017  Primary Cardiologist: Dr. Tenny Craw  Subjective   No chest pain and no SOB, no awareness of HR.  Inpatient Medications    Scheduled Meds: . apixaban  5 mg Oral BID  . furosemide  80 mg Oral Daily  . metoprolol succinate  150 mg Oral Daily   Continuous Infusions:  PRN Meds: acetaminophen **OR** acetaminophen, Influenza vac split quadrivalent PF, ipratropium-albuterol, menthol-cetylpyridinium, ondansetron **OR** ondansetron (ZOFRAN) IV   Vital Signs    Vitals:   07/23/17 1051 07/23/17 1352 07/23/17 2010 07/24/17 0500  BP: (!) 125/100 (!) 116/91 (!) 110/95 (!) 111/94  Pulse: (!) 103  89 86  Resp:   18 18  Temp:  97.8 F (36.6 C) 98.2 F (36.8 C) 98.6 F (37 C)  TempSrc:  Oral Oral Oral  SpO2:   97% 96%  Weight:    215 lb 3.2 oz (97.6 kg)  Height:        Intake/Output Summary (Last 24 hours) at 07/24/17 0846 Last data filed at 07/24/17 0500  Gross per 24 hour  Intake              415 ml  Output              300 ml  Net              115 ml   Filed Weights   07/22/17 0640 07/23/17 0419 07/24/17 0500  Weight: 224 lb (101.6 kg) 220 lb (99.8 kg) 215 lb 3.2 oz (97.6 kg)    Telemetry    A fib mostly controlled under 105 occ up to 116.  - Personally Reviewed  ECG    No new - Personally Reviewed  Physical Exam   GEN: No acute distress.   Neck: No JVD Cardiac: irreg irreg, no murmurs, rubs, or gallops.  Respiratory: Clear to auscultation bilaterally. GI: Soft, nontender, non-distended  MS: No edema; No deformity. Neuro:  Nonfocal  Psych: Normal affect   Labs    Chemistry Recent Labs Lab 07/18/17 1527  07/22/17 0415 07/23/17 0306 07/24/17 0356  NA 141  < > 141 142 142  K 5.2*  < > 3.9 4.1 4.0  CL 108  < > 105 103 104  CO2 25  < > 26 28 28   GLUCOSE 136*  < > 115* 92 118*  BUN 30*  < > 41* 35* 31*  CREATININE 2.13*  < > 1.94* 1.91* 1.65*  CALCIUM 9.0  < > 8.8* 9.1 9.2  PROT 6.3*   --   --   --   --   ALBUMIN 3.1*  --   --   --   --   AST 33  --   --   --   --   ALT 38  --   --   --   --   ALKPHOS 65  --   --   --   --   BILITOT 0.5  --   --   --   --   GFRNONAA 31*  < > 35* 35* 42*  GFRAA 36*  < > 40* 41* 49*  ANIONGAP 8  < > 10 11 10   < > = values in this interval not displayed.   Hematology Recent Labs Lab 07/19/17 0425 07/20/17 0334 07/22/17 0415  WBC 10.9* 10.5 8.9  RBC 5.28 5.11 5.47  HGB 15.1 14.2 15.4  HCT 45.6 44.1 46.7  MCV  86.4 86.3 85.4  MCH 28.6 27.8 28.2  MCHC 33.1 32.2 33.0  RDW 14.2 14.3 14.0  PLT 370 411* 385    Cardiac Enzymes Recent Labs Lab 07/18/17 2309 07/19/17 0425 07/19/17 1013  TROPONINI 0.03* 0.03* <0.03    Recent Labs Lab 07/18/17 1546  TROPIPOC 0.04     BNP Recent Labs Lab 07/18/17 1629  BNP 845.9*     DDimer No results for input(s): DDIMER in the last 168 hours.   Radiology    No results found.  Cardiac Studies   Echo 07/19/17 Study Conclusions  - Left ventricle: The cavity size was normal. Systolic function was   moderately reduced. The estimated ejection fraction was in the   range of 35% to 40%. Diffuse hypokinesis. The study was not   technically sufficient to allow evaluation of LV diastolic   dysfunction due to atrial fibrillation. - Mitral valve: There was mild regurgitation. - Left atrium: The atrium was moderately dilated. - Right ventricle: Systolic function was normal. - Right atrium: The atrium was mildly dilated. - Pulmonary arteries: Systolic pressure was moderately increased.   PA peak pressure: 44 mm Hg (S). - Inferior vena cava: The vessel was dilated. The respirophasic   diameter changes were blunted (< 50%), consistent with elevated   central venous pressure. - Pericardium, extracardiac: There was no pericardial effusion.   There was a left pleural effusion.  Impressions:  - When compared to the prior study from 03/23/2014 LVEF has   decreased from 55-60% to  35-40%. The LVEF evaluation might be   affected by atrial fibrillation with RVR during the acquisition.   Patient Profile     66 y.o. male with atrial fibrillation, CHF and CKD    Assessment & Plan    Persistent atrial fib -Plan for HR control - needs follow up echo with HR controlled.  Plan for HR control then DCCV in 4 weeks.  On Eliquis -on toprol XL 150 mg daily. Will have follow up in 7-10 days TOC- pt going home  Acute on chronic systolic and diastolic HF- LVEF to 35-40%, possibly tachy induced.  Neg 8,022 since admit and wt down from 238 lbs to 215 lbs.  --on lasix 80 daily   Renal insuff Cr 1.65 today down from pk of 2.31    For questions or updates, please contact [facility] Please consult [email or web address] for contact info under Cardiology/STEMI.      Signed, Nada BoozerLaura Ingold, NP  07/24/2017, 8:46 AM    Pt seen and examined  I agree with findings of L INgold above   On exam:  LUngs CTA  Cardiac Irreg Irreg  Ext with trace edema  Atrial fib:  Rate control for now  Anticoag with Eliquis bid  F/U in clinic on 10/31 Acute on chronic systolic CHF   Volume status is good    Plan for d/c today    Dietrich PatesPaula Myliah Medel

## 2017-07-24 NOTE — Progress Notes (Signed)
Patient received discharge information and acknowledged understanding of it. Patient received prescriptions and eliquis card. Patient IV was removed.

## 2017-07-24 NOTE — Telephone Encounter (Signed)
New message    TOC appt made per Vernona Rieger with Tereso Newcomer on 12/31 at 8:45am.

## 2017-07-25 NOTE — Telephone Encounter (Signed)
Patient contacted regarding discharge from [facility] on 10/23.  Patient understands to follow up with provider Tereso Newcomer, NP  on 08/01/17 at 8:45 at [address] [facility] [address] in [place]. Patient understands discharge instructions? Yes Patient understands medications and regiment? Yes Patient understands to bring all medications to this visit? Yes  The pt states that he is going well and has no complaints at this time. He does have [facility]'s phone number to call if he has any question or concerns.

## 2017-07-31 ENCOUNTER — Inpatient Hospital Stay: Payer: Self-pay

## 2017-07-31 ENCOUNTER — Encounter: Payer: Self-pay | Admitting: Physician Assistant

## 2017-07-31 DIAGNOSIS — I420 Dilated cardiomyopathy: Secondary | ICD-10-CM | POA: Insufficient documentation

## 2017-07-31 DIAGNOSIS — I5022 Chronic systolic (congestive) heart failure: Secondary | ICD-10-CM | POA: Insufficient documentation

## 2017-07-31 DIAGNOSIS — I4819 Other persistent atrial fibrillation: Secondary | ICD-10-CM | POA: Insufficient documentation

## 2017-07-31 NOTE — Progress Notes (Signed)
Cardiology Office Note:    Date:  08/01/2017   ID:  Jeffery Christian, DOB 1951/06/24, MRN [id number]  PCP:  Patient, No Pcp Per  Cardiologist:  Dr. Dietrich Pates    Referring MD: No ref. provider found   Chief Complaint  Patient presents with  . Hospitalization Follow-up    Heart failure, atrial fibrillation    History of Present Illness:    Jeffery Christian is a 66 y.o. male with a hx of paroxysmal AF, CKD, gout.  He was admitted 10/17-10/23 with acute systolic HF in the setting of AF with RVR.  An echocardiogram demonstrated reduced EF at 35-40%.  He was diuresed and started on rate controlling therapy.  His congestive heart failure medications were adjusted.   Plan is for outpatient cardioversion to restore NSR and repeat echocardiogram once in NSR/heart rate controlled to recheck EF.    Jeffery Christian returns for follow-up.  He is here today with his friend.  Since discharge, he has had good days and bad days.  On the bad days, he feels tired and more short of breath.  He notes a cough with lying supine.  He denies PND.  Lower extremity edema is much improved.  He denies chest discomfort or syncope.  CHADS2-VASc=1 (CHF) [=2 after birthday [birth year] with age 47, CHF]   Prior CV studies:   The following studies were reviewed today:  Echo 07/19/17 EF 35-40, diffuse HK, mild MR, moderate LAE, mild RAE, PASP 44, L pleural eff  Echo 03/23/14 EF 55-60, mild MR, mod LAE, mild RAE   Past Medical History:  Diagnosis Date  . Chronic systolic CHF (congestive heart failure) (HCC)    EF 35-40, diffuse HK, mild MR, moderate LAE, mild RAE, PASP 44, L pleural eff  . Dilated cardiomyopathy (HCC)    likely related to tachycardia  . Persistent atrial fibrillation [facility])     Past Surgical History:  Procedure Laterality Date  . INCISION AND DRAINAGE ABSCESS Left 03/22/2014   Procedure: INCISION AND DRAINAGE ABSCESS LEFT BUTTOCK ABSCESS;  Surgeon: [name], MD;  Location: [facility] OR;  Service: General;   Laterality: Left;    Current Medications: Current Meds  Medication Sig  . apixaban (ELIQUIS) 5 MG TABS tablet Take 1 tablet (5 mg total) by mouth 2 (two) times daily.  . furosemide (LASIX) 80 MG tablet Take 1 tablet (80 mg total) by mouth daily.  . [DISCONTINUED] metoprolol succinate (TOPROL-XL) 50 MG 24 hr tablet Take 3 tablets (150 mg total) by mouth daily.     Allergies:   Patient has no known allergies.   Social History  Substance Use Topics  . Smoking status: Never Smoker  . Smokeless tobacco: Never Used  . Alcohol use No     Family Hx: The patient's family history includes Cancer in his mother.  ROS:   Please see the history of present illness.    ROS All other systems reviewed and are negative.   EKGs/Labs/Other Test Reviewed:    EKG:  EKG is  ordered today.  The ekg ordered today demonstrates atrial fibrillation, heart rate 96  Recent Labs: 07/18/2017: ALT 38; B Natriuretic Peptide 845.9; Magnesium 2.1; TSH 2.183 07/22/2017: Hemoglobin 15.4; Platelets 385 07/24/2017: BUN 31; Creatinine, Ser 1.65; Potassium 4.0; Sodium 142   Recent Lipid Panel No results found for: CHOL, TRIG, HDL, CHOLHDL, LDLCALC, LDLDIRECT  Physical Exam:    VS:  BP 130/70   Pulse 96   Ht 6' (1.829 m)   Wt 228  lb 12.8 oz (103.8 kg)   SpO2 98%   BMI 31.03 kg/m     Wt Readings from Last 3 Encounters:  08/01/17 228 lb 12.8 oz (103.8 kg)  07/24/17 215 lb 3.2 oz (97.6 kg)  03/24/14 204 lb 9.4 oz (92.8 kg)     Physical Exam  Constitutional: He is oriented to person, place, and time. He appears well-developed and well-nourished. No distress.  HENT:  Head: Normocephalic and atraumatic.  Neck: JVD (JVP 6-7 cm) present.  Cardiovascular: Normal rate.  An irregularly irregular rhythm present.  No murmur heard. Pulmonary/Chest: Effort normal. He has no rales.  Abdominal: Soft.  Musculoskeletal: He exhibits edema (1+ bilateral LE/ankle edema).  Neurological: He is alert and oriented to  person, place, and time.  Skin: Skin is warm and dry.    ASSESSMENT:    1. Chronic systolic CHF (congestive heart failure) (HCC)   2. Persistent atrial fibrillation (HCC)   3. Dilated cardiomyopathy (HCC)    PLAN:    In order of problems listed above:  1.  Chronic systolic CHF (congestive heart failure) (HCC) EF 35-40.  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  He is [place] heart association class II.  He still seems to have some volume excess.  I have recommended he increase his Lasix for several days.  -Increase Lasix to 80 mg in the morning and 40 mg in the afternoon times 4 days  -Resume Lasix 80 mg daily after 4 days  -BMET today and BMET in 1 week  2.  Persistent atrial fibrillation (HCC) Heart rate with fair control.  His blood pressure could tolerate more beta blocker.  I will increase his metoprolol XL to 200 mg daily.  Continue apixaban.  We discussed proceeding with cardioversion after 4 weeks of uninterrupted anticoagulation.  He notes adherence to his Eliquis since discharge from the hospital.  I advised him to contact us should he miss a dose of Eliquis.  -Schedule DCCV after 4 weeks of uninterrupted anticoagulation (week of November 19)  3.  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  Plan echocardiogram once with normal sinus rhythm restored or heart rate well controlled.  Consider adding hydralazine/nitrates at follow-up.  Of note, he does take PDE-5 inhibitors and may not be a good candidate for nitrate therapy.  4.  Chronic kidney disease Obtain follow-up BMET today and repeat in 1 week given adjustments in diuretic.  Dispo:  Return in about 6 weeks (around 09/12/2017) for Post Procedure Follow Up, w/ Dr. Tenny Crawoss.   Medication Adjustments/Labs and Tests Ordered: Current medicines are reviewed at length with the patient today.  Concerns regarding medicines are outlined above.  Tests Ordered: Orders Placed This Encounter    Procedures  . Basic Metabolic Panel (BMET)  . Basic Metabolic Panel (BMET)  . EKG 12-Lead   Medication Changes: Meds ordered this encounter  Medications  . metoprolol succinate (TOPROL-XL) 100 MG 24 hr tablet    Sig: Take 2 tablets (200 mg total) by mouth daily. Take with or immediately following a meal.    Dispense:  180 tablet    Refill:  3    Signed, Tereso NewcomerScott Maximos Zayas, PA-C  08/01/2017 5:28 PM    [facility] [address], [place], [place]  [address] Phone: [phone]; Fax: [phone]

## 2017-08-01 ENCOUNTER — Encounter: Payer: Self-pay | Admitting: *Deleted

## 2017-08-01 ENCOUNTER — Encounter: Payer: Self-pay | Admitting: Physician Assistant

## 2017-08-01 ENCOUNTER — Ambulatory Visit (INDEPENDENT_AMBULATORY_CARE_PROVIDER_SITE_OTHER): Payer: Self-pay | Admitting: Physician Assistant

## 2017-08-01 ENCOUNTER — Encounter (INDEPENDENT_AMBULATORY_CARE_PROVIDER_SITE_OTHER): Payer: Self-pay

## 2017-08-01 VITALS — BP 130/70 | HR 96 | Ht 72.0 in | Wt 228.8 lb

## 2017-08-01 DIAGNOSIS — I5022 Chronic systolic (congestive) heart failure: Secondary | ICD-10-CM

## 2017-08-01 DIAGNOSIS — I4819 Other persistent atrial fibrillation: Secondary | ICD-10-CM

## 2017-08-01 DIAGNOSIS — I481 Persistent atrial fibrillation: Secondary | ICD-10-CM

## 2017-08-01 DIAGNOSIS — I420 Dilated cardiomyopathy: Secondary | ICD-10-CM

## 2017-08-01 LAB — BASIC METABOLIC PANEL
BUN/Creatinine Ratio: 19 (ref 10–24)
BUN: 36 mg/dL — ABNORMAL HIGH (ref 8–27)
CALCIUM: 8.9 mg/dL (ref 8.6–10.2)
CO2: 23 mmol/L (ref 20–29)
CREATININE: 1.88 mg/dL — AB (ref 0.76–1.27)
Chloride: 105 mmol/L (ref 96–106)
GFR calc Af Amer: 43 mL/min/{1.73_m2} — ABNORMAL LOW (ref >59)
GFR, EST NON AFRICAN AMERICAN: 37 mL/min/{1.73_m2} — AB (ref >59)
Glucose: 82 mg/dL (ref 65–99)
Potassium: 5.1 mmol/L (ref 3.5–5.2)
Sodium: 142 mmol/L (ref 134–144)

## 2017-08-01 MED ORDER — METOPROLOL SUCCINATE ER 100 MG PO TB24: 200.0000 mg | ORAL_TABLET | Freq: Every day | ORAL | 3 refills | Status: DC

## 2017-08-01 NOTE — Patient Instructions (Signed)
Medication Instructions:  1. INCREASE METOPROLOL TO 200 MG DAILY; NEW RX HAAS BEEN SENT IN   2. INCREASE LASIX TO 80 MG IN THE MORNING AND 40 MG IN THE PM ; DO THIS FOR 3 DAYS ; AFTER THE 3 DAYS GO BACK TO LASIX 80 MG EVERY MORNING Labwork: 1. BMET TODAY  2. BMET TO BE DONE IN 1 WEEK  Testing/Procedures: Your physician has recommended that you have a Cardioversion (DCCV). Electrical Cardioversion uses a jolt of electricity to your heart either through paddles or wired patches attached to your chest. This is a controlled, usually prescheduled, procedure. Defibrillation is done under light anesthesia in the hospital, and you usually go home the day of the procedure. This is done to get your heart back into a normal rhythm. You are not awake for the procedure. Please see the instruction sheet given to you today. SEE INSTRUCTION LETTER     Follow-Up: DR. Tenny Craw POST CARDIOVESION  Any Other Special Instructions Will Be Listed Below (If Applicable).     If you need a refill on your cardiac medications before your next appointment, please call your pharmacy.

## 2017-08-02 ENCOUNTER — Telehealth: Payer: Self-pay | Admitting: *Deleted

## 2017-08-02 NOTE — Telephone Encounter (Signed)
-----   Message from Beatrice Lecher, [place] sent at 08/02/2017 10:40 AM EDT ----- Please call the patient Renal function stable.  Continue current medications and follow up as planned.  Make sure he has a repeat BMET in 1 week. Tereso Newcomer, PA-C 08/02/2017 10:40 AM

## 2017-08-02 NOTE — Telephone Encounter (Signed)
DPR on file ok to lmom. [facility] lab work ok, continue on current medications, keep lab appt 11/7. If any questions please call [phone].

## 2017-08-04 ENCOUNTER — Inpatient Hospital Stay (HOSPITAL_COMMUNITY)
Admission: EM | Admit: 2017-08-04 | Discharge: 2017-08-07 | DRG: 291 | Disposition: A | Discharge status: Home / Self Care | Payer: Medicare Other | Attending: Family Medicine | Admitting: Family Medicine

## 2017-08-04 ENCOUNTER — Emergency Department (HOSPITAL_COMMUNITY): Payer: Medicare Other

## 2017-08-04 ENCOUNTER — Encounter (HOSPITAL_COMMUNITY): Payer: Self-pay | Admitting: *Deleted

## 2017-08-04 DIAGNOSIS — R0602 Shortness of breath: Secondary | ICD-10-CM

## 2017-08-04 DIAGNOSIS — M109 Gout, unspecified: Secondary | ICD-10-CM

## 2017-08-04 DIAGNOSIS — I420 Dilated cardiomyopathy: Secondary | ICD-10-CM | POA: Diagnosis present

## 2017-08-04 DIAGNOSIS — I5022 Chronic systolic (congestive) heart failure: Secondary | ICD-10-CM | POA: Diagnosis not present

## 2017-08-04 DIAGNOSIS — J189 Pneumonia, unspecified organism: Secondary | ICD-10-CM | POA: Diagnosis present

## 2017-08-04 DIAGNOSIS — R9431 Abnormal electrocardiogram [ECG] [EKG]: Secondary | ICD-10-CM | POA: Diagnosis not present

## 2017-08-04 DIAGNOSIS — R739 Hyperglycemia, unspecified: Secondary | ICD-10-CM | POA: Diagnosis not present

## 2017-08-04 DIAGNOSIS — R05 Cough: Secondary | ICD-10-CM | POA: Diagnosis not present

## 2017-08-04 DIAGNOSIS — N183 Chronic kidney disease, stage 3 unspecified: Secondary | ICD-10-CM

## 2017-08-04 DIAGNOSIS — I4819 Other persistent atrial fibrillation: Secondary | ICD-10-CM | POA: Diagnosis present

## 2017-08-04 DIAGNOSIS — Z9111 Patient's noncompliance with dietary regimen: Secondary | ICD-10-CM

## 2017-08-04 DIAGNOSIS — Z91128 Patient's intentional underdosing of medication regimen for other reason: Secondary | ICD-10-CM

## 2017-08-04 DIAGNOSIS — Z7901 Long term (current) use of anticoagulants: Secondary | ICD-10-CM

## 2017-08-04 DIAGNOSIS — I5023 Acute on chronic systolic (congestive) heart failure: Principal | ICD-10-CM | POA: Diagnosis present

## 2017-08-04 DIAGNOSIS — I110 Hypertensive heart disease with heart failure: Secondary | ICD-10-CM | POA: Diagnosis not present

## 2017-08-04 DIAGNOSIS — I4891 Unspecified atrial fibrillation: Secondary | ICD-10-CM | POA: Diagnosis not present

## 2017-08-04 DIAGNOSIS — I509 Heart failure, unspecified: Secondary | ICD-10-CM | POA: Diagnosis not present

## 2017-08-04 DIAGNOSIS — T501X6A Underdosing of loop [high-ceiling] diuretics, initial encounter: Secondary | ICD-10-CM | POA: Diagnosis present

## 2017-08-04 DIAGNOSIS — I480 Paroxysmal atrial fibrillation: Secondary | ICD-10-CM | POA: Diagnosis not present

## 2017-08-04 DIAGNOSIS — T447X6A Underdosing of beta-adrenoreceptor antagonists, initial encounter: Secondary | ICD-10-CM | POA: Diagnosis present

## 2017-08-04 DIAGNOSIS — X58XXXA Exposure to other specified factors, initial encounter: Secondary | ICD-10-CM | POA: Diagnosis present

## 2017-08-04 LAB — CBC
HCT: 47.8 % (ref 39.0–52.0)
Hemoglobin: 15.5 g/dL (ref 13.0–17.0)
MCH: 28.1 pg (ref 26.0–34.0)
MCHC: 32.4 g/dL (ref 30.0–36.0)
MCV: 86.8 fL (ref 78.0–100.0)
Platelets: 255 10*3/uL (ref 150–400)
RBC: 5.51 MIL/uL (ref 4.22–5.81)
RDW: 15 % (ref 11.5–15.5)
WBC: 11.8 10*3/uL — ABNORMAL HIGH (ref 4.0–10.5)

## 2017-08-04 LAB — BASIC METABOLIC PANEL
Anion gap: 9 (ref 5–15)
BUN: 28 mg/dL — ABNORMAL HIGH (ref 6–20)
CO2: 19 mmol/L — ABNORMAL LOW (ref 22–32)
Calcium: 8.6 mg/dL — ABNORMAL LOW (ref 8.9–10.3)
Chloride: 111 mmol/L (ref 101–111)
Creatinine, Ser: 1.64 mg/dL — ABNORMAL HIGH (ref 0.61–1.24)
GFR calc Af Amer: 49 mL/min — ABNORMAL LOW (ref >60)
GFR calc non Af Amer: 43 mL/min — ABNORMAL LOW (ref >60)
Glucose, Bld: 123 mg/dL — ABNORMAL HIGH (ref 65–99)
Potassium: 5.1 mmol/L (ref 3.5–5.1)
Sodium: 139 mmol/L (ref 135–145)

## 2017-08-04 LAB — TROPONIN I: Troponin I: <0.03 ng/mL (ref <0.03)

## 2017-08-04 MED ORDER — FUROSEMIDE 10 MG/ML IJ SOLN
40.0000 mg | Freq: Once | INTRAMUSCULAR | Status: AC
  Administered 2017-08-05: 40 mg via INTRAVENOUS
  Filled 2017-08-04: qty 4

## 2017-08-04 MED ORDER — DILTIAZEM HCL 100 MG IV SOLR
5.0000 mg/h | Freq: Once | INTRAVENOUS | Status: AC
  Administered 2017-08-05: 5 mg/h via INTRAVENOUS
  Filled 2017-08-04: qty 100

## 2017-08-04 MED ORDER — DILTIAZEM HCL 25 MG/5ML IV SOLN
10.0000 mg | Freq: Once | INTRAVENOUS | Status: AC
  Administered 2017-08-05: 10 mg via INTRAVENOUS
  Filled 2017-08-04: qty 5

## 2017-08-04 NOTE — ED Provider Notes (Signed)
[facility] EMERGENCY DEPARTMENT Provider Note   CSN: [id number] Arrival date & time: 08/04/17  2118     History   Chief Complaint Chief Complaint  Patient presents with  . Shortness of Breath    HPI Jeffery Christian is a 66 y.o. male with a history of chronic systolic CHF with an EF of 35-40, dilated cardiomyopathy, persistent A. Fib who presents today for evaluation of worsening shortness of breath.  He says that since his discharge he has been gradually worsening and is also coughing up green sputum.  He reports that he is not having any chest pain, just worsening cough and shortness of breath.  He reports compliance with his medications and has not missed any doses of his Eliquis or his other medications.  He is scheduled for a cardioversion on the 19th.  Dr. Tenny Craw is his cardiologist.    He was discharged on 10/23 after being admitted for a-fib RVR.  HPI  Past Medical History:  Diagnosis Date  . Chronic systolic CHF (congestive heart failure) (HCC)    EF 35-40, diffuse HK, mild MR, moderate LAE, mild RAE, PASP 44, L pleural eff  . Dilated cardiomyopathy (HCC)    likely related to tachycardia  . Persistent atrial fibrillation [facility])     Patient Active Problem List   Diagnosis Date Noted  . Atrial fibrillation with RVR (HCC) 08/05/2017  . Persistent atrial fibrillation (HCC)   . Chronic systolic CHF (congestive heart failure) (HCC)   . Dilated cardiomyopathy (HCC)   . Shortness of breath   . History of gout 07/18/2017  . Left buttock abscess 03/22/2014    Past Surgical History:  Procedure Laterality Date  . INCISION AND DRAINAGE ABSCESS Left 03/22/2014   Procedure: INCISION AND DRAINAGE ABSCESS LEFT BUTTOCK ABSCESS;  Surgeon: [name], MD;  Location: [facility] OR;  Service: General;  Laterality: Left;       Home Medications    Prior to Admission medications   Medication Sig Start Date End Date Taking? Authorizing Provider  apixaban (ELIQUIS) 5 MG  TABS tablet Take 1 tablet (5 mg total) by mouth 2 (two) times daily. 07/24/17  Yes Philip Aspen, Limmie Patricia, MD  furosemide (LASIX) 80 MG tablet Take 1 tablet (80 mg total) by mouth daily. 07/25/17  Yes Philip Aspen, Limmie Patricia, MD  metoprolol succinate (TOPROL-XL) 100 MG 24 hr tablet Take 2 tablets (200 mg total) by mouth daily. Take with or immediately following a meal. Patient taking differently: Take 100 mg by mouth 2 (two) times daily. Take with or immediately following a meal. 08/01/17 10/30/17 Yes Weaver, Evern Bio, PA-C    Family History Family History  Problem Relation Age of Onset  . Leukemia Father        8  . Cancer Mother     Social History Social History  Substance Use Topics  . Smoking status: Never Smoker  . Smokeless tobacco: Never Used  . Alcohol use No     Allergies   Patient has no known allergies.   Review of Systems Review of Systems  Constitutional: Negative for chills and fever.  HENT: Negative for ear pain and sore throat.   Eyes: Negative for pain and visual disturbance.  Respiratory: Positive for cough and shortness of breath.   Cardiovascular: Positive for leg swelling. Negative for chest pain and palpitations.  Gastrointestinal: Negative for abdominal pain, nausea and vomiting.  Genitourinary: Negative for dysuria and hematuria.  Musculoskeletal: Negative for arthralgias and back  pain.  Skin: Negative for color change and rash.  Neurological: Negative for seizures, syncope and headaches.  All other systems reviewed and are negative.    Physical Exam Updated Vital Signs BP (!) 129/101   Pulse 79   Temp (!) 97.3 F (36.3 C) (Oral)   Resp (!) 28   Ht 6' (1.829 m)   Wt 99.8 kg (220 lb)   SpO2 97%   BMI 29.84 kg/m   Physical Exam  Constitutional: He is oriented to person, place, and time. He appears well-developed and well-nourished.  HENT:  Head: Normocephalic and atraumatic.  Mouth/Throat: Oropharynx is clear and moist.  Eyes:  Conjunctivae are normal.  Neck: Neck supple.  Cardiovascular: Normal rate, regular rhythm and intact distal pulses.   No murmur heard. +1 pitting edema to bilateral lower extremities.   Pulmonary/Chest: Effort normal. Tachypnea noted. No respiratory distress. He has decreased breath sounds in the right lower field and the left lower field. He has rales in the right lower field and the left lower field.  Abdominal: Soft. He exhibits no distension. There is no tenderness.  Musculoskeletal: He exhibits no edema or deformity.  Neurological: He is alert and oriented to person, place, and time.  Skin: Skin is warm and dry. He is not diaphoretic.  Psychiatric: He has a normal mood and affect. His behavior is normal.  Nursing note and vitals reviewed.    ED Treatments / Results  Labs (all labs ordered are listed, but only abnormal results are displayed) Labs Reviewed  BASIC METABOLIC PANEL - Abnormal; Notable for the following:       Result Value   CO2 19 (*)    Glucose, Bld 123 (*)    BUN 28 (*)    Creatinine, Ser 1.64 (*)    Calcium 8.6 (*)    GFR calc non Af Amer 43 (*)    GFR calc Af Amer 49 (*)    All other components within normal limits  CBC - Abnormal; Notable for the following:    WBC 11.8 (*)    All other components within normal limits  TROPONIN I  BRAIN NATRIURETIC PEPTIDE    EKG  EKG Interpretation  Date/Time:  Saturday August 04 2017 21:22:29 EDT Ventricular Rate:  121 PR Interval:    QRS Duration: 80 QT Interval:  318 QTC Calculation: 451 R Axis:   49 Text Interpretation:  Atrial fibrillation with rapid ventricular response Abnormal ECG Confirmed by Margarita Grizzle [id number]) on 08/04/2017 10:07:21 PM       Radiology Dg Chest Portable 1 View  Result Date: 08/04/2017 CLINICAL DATA:  Shortness of breath.  Abnormal EKG.  Cough. EXAM: PORTABLE CHEST 1 VIEW COMPARISON:  07/19/2017 FINDINGS: Low lung volumes. Chronic cardiomegaly. Unchanged mediastinal contours.  Perihilar opacities with slight improvement on the left from prior exam. Persistent peripheral right midlung opacity, improved. Bilateral pleural effusions and bibasilar opacities. No pneumothorax. IMPRESSION: Persistent perihilar opacities with slight improvement on the left from prior exam, likely pulmonary edema. Improved right mid lung consolidation. Cardiomegaly with bilateral pleural effusions and bibasilar airspace disease again seen. Overall findings likely represent an element of CHF, with superimposed improving pneumonia. Electronically Signed   By: Rubye Oaks M.D.   On: 08/04/2017 22:37    Procedures Procedures (including critical care time)  Medications Ordered in ED Medications  diltiazem (CARDIZEM) 100 mg in dextrose 5 % 100 mL (1 mg/mL) infusion (not administered)  furosemide (LASIX) injection 40 mg (40 mg Intravenous Given 08/05/17 0026)  diltiazem (CARDIZEM) injection 10 mg (10 mg Intravenous Given 08/05/17 0029)     Initial Impression / Assessment and Plan / ED Course  I have reviewed the triage vital signs and the nursing notes.  Pertinent labs & imaging results that were available during my care of the patient were reviewed by me and considered in my medical decision making (see chart for details).  Clinical Course as of Aug 05 41  Sat Aug 04, 2017  2200 Attempted to see patient, x-ray in process.   [EH]    Clinical Course User Index [EH] Cristina GongHammond, Yzabelle Calles W, PA-C   Kathryne SharperGary Schiro Zentz today for evaluation of gradually worsening shortness of breath along with productive cough.  X-ray was obtained that showed bilateral pulmonary edema.  He was tachypneic on exam and found to be in A. fib with a ventricle rate varied from 110-120 beats per minute.  Initial troponin was obtained and was negative.  He was treated with Lasix, along with diltiazem injection and drip.  Hospitalist was consulted for admission and agreed to admit patient.    This patient was seen as a shared  visit with Dr. Rosalia Hammersay.   The patient appears reasonably stabilized for admission considering the current resources, flow, and capabilities available in the ED at this time, and I doubt any other [facility] requiring further screening and/or treatment in the ED prior to admission.   Final Clinical Impressions(s) / ED Diagnoses   Final diagnoses:  Shortness of breath  Atrial fibrillation with RVR (HCC)  Chronic systolic CHF (congestive heart failure) [facility](HCC)    New Prescriptions New Prescriptions   No medications on file     Norman ClayHammond, Nobuko Gsell W, PA-C 08/05/17 [id number]    Margarita Grizzleay, Danielle, MD 08/07/17 1052

## 2017-08-04 NOTE — ED Triage Notes (Signed)
The pt is c/o sob  He reports he was admitted and released 2-3 days ago.  He does not know what his diagnosis was  He reports that he cannot stop coughing thjick greenish sputum  Rapid respirations alert laughing talking

## 2017-08-04 NOTE — H&P (Signed)
History and Physical    Jeffery Christian ZOX:[id number] DOB: 08-14-1951 DOA: 08/04/2017  PCP: Patient, No Pcp Per  Patient coming from: Home  Chief Complaint: SOB  HPI: Jeffery Christian is a 66 y.o. male with medical history significant of Afib with RVR, systolic CHF who presents for worsening SOB.  Jeffery Christian is a 66yo man with limited PMH.  He presented to the hospital on 07/18/17 with Afib with RVR and SOB and was found to have systolic CHF thought to be related to uncontrolled Afib with RVR.  He was discharged on lasix, metoprolol and eliquis.  The plan was to attempt DCCV after 4 weeks of uninterrupted anticoagulation given his unknown history of Afib.  Prior to this admission, he had rarely seen a doctor, and he felt that he was healthy.  Since being discharged on 10/23, he stopped taking his lasix because he does not like to pee all day per him.  He also noted increased fluid intake, particularly water and juice, but does not eat excessively salty food.  He does not seem to understand completely his diagnosis of heart failure and is asking for "something other" than a diuretic to get the water out of his system.  He further has symptoms of cough.  He notes being recently treated for pneumonia by a physician outside of our system.  He has been having cough with yellowish sputum since that time and feels he is not much better.  Further symptoms include lightheadedness with activity, DOE, orthopnea and PND.  He notes not sleeping much because he has to be sitting completely upright.  He denies chest pain.    ED Course: In the ED, he had a CXR which showed likely pulmonary edema with an improving right mid lung consolidation.  He had a Cr of 1.64 which is around baseline for him.  WBC of 11.8, Trop of < 0.03.  He was in Afib with RVR and was started on a cardizem drip.   Review of Systems: As per HPI otherwise 10 point review of systems negative.   Past Medical History:  Diagnosis Date  . Chronic systolic CHF  (congestive heart failure) (HCC)    EF 35-40, diffuse HK, mild MR, moderate LAE, mild RAE, PASP 44, L pleural eff  . Dilated cardiomyopathy (HCC)    likely related to tachycardia  . Persistent atrial fibrillation [facility])     Past Surgical History:  Procedure Laterality Date  . INCISION AND DRAINAGE ABSCESS Left 03/22/2014   Procedure: INCISION AND DRAINAGE ABSCESS LEFT BUTTOCK ABSCESS;  Surgeon: [name], MD;  Location: [facility] OR;  Service: General;  Laterality: Left;   Reviewed with patient.    reports that he has never smoked. He has never used smokeless tobacco. He reports that he does not drink alcohol or use drugs.  No Known Allergies  Reviewed with patient.  Family History  Problem Relation Age of Onset  . Leukemia Father        1  . Cancer Mother    Prior to Admission medications   Medication Sig Start Date End Date Taking? Authorizing Provider  apixaban (ELIQUIS) 5 MG TABS tablet Take 1 tablet (5 mg total) by mouth 2 (two) times daily. 07/24/17  Yes Philip Aspen, Limmie Patricia, MD  furosemide (LASIX) 80 MG tablet (NOT TAKING) Take 1 tablet (80 mg total) by mouth daily. 07/25/17  Yes Philip Aspen, Limmie Patricia, MD  metoprolol succinate (TOPROL-XL) 100 MG 24 hr tablet Take 2 tablets (200 mg  total) by mouth daily. Take with or immediately following a meal. Patient taking differently: Take 100 mg by mouth 2 (two) times daily. Take with or immediately following a meal. 08/01/17 10/30/17 Yes Beatrice LecherWeaver, Scott T, [place]    Physical Exam: Vitals:   08/04/17 2232 08/04/17 2245 08/04/17 2300 08/05/17 0030  BP: (!) 123/101  (!) 120/107 (!) 129/101  Pulse: 93 (!) 34 (!) 112 79  Resp: 20 (!) 25 (!) 25 (!) 28  Temp:      TempSrc:      SpO2: 98% 98% 98% 97%  Weight:      Height:        Constitutional:Sitting up in bed, SOB with lying at 30% angle,  in place Vitals:   08/04/17 2232 08/04/17 2245 08/04/17 2300 08/05/17 0030  BP: (!) 123/101  (!) 120/107 (!) 129/101  Pulse: 93  (!) 34 (!) 112 79  Resp: 20 (!) 25 (!) 25 (!) 28  Temp:      TempSrc:      SpO2: 98% 98% 98% 97%  Weight:      Height:       Eyes:  lids and conjunctivae normal ENMT: Mucous membranes are moist.  Neck: normal, supple Respiratory: Rhonchi throughout, clears with coughing, crackles to mid lungs.  No wheezing.  SOB with lying back at a slight angle.  Cardiovascular: Tachycardic rate and irreg irreg rhythm, no murmurs / rubs / gallops. + pitting edema to mid calf 2+ Abdomen: no tenderness. Bowel sounds positive.  Musculoskeletal: no clubbing / cyanosis.  Normal muscle tone.  Skin: no rashes, lesions, ulcers.  Neurologic: Grossly intact, moving all extremities in bed.  Psychiatric: Limited insight, upset with current situation and angry because the cardiologist will not do a DCCV sooner (planned for the 19th of this month)   Labs on Admission: I have personally reviewed following labs and imaging studies  CBC:  Recent Labs Lab 08/04/17 2132  WBC 11.8*  HGB 15.5  HCT 47.8  MCV 86.8  PLT 255   Basic Metabolic Panel:  Recent Labs Lab 08/01/17 1036 08/04/17 2132  NA 142 139  K 5.1 5.1  CL 105 111  CO2 23 19*  GLUCOSE 82 123*  BUN 36* 28*  CREATININE 1.88* 1.64*  CALCIUM 8.9 8.6*   GFR: Estimated Creatinine Clearance: 55.7 mL/min (A) (by C-G formula based on SCr of 1.64 mg/dL (H)). Liver Function Tests: No results for input(s): AST, ALT, ALKPHOS, BILITOT, PROT, ALBUMIN in the last 168 hours. No results for input(s): LIPASE, AMYLASE in the last 168 hours. No results for input(s): AMMONIA in the last 168 hours. Coagulation Profile: No results for input(s): INR, PROTIME in the last 168 hours. Cardiac Enzymes:  Recent Labs Lab 08/04/17 2132  TROPONINI <0.03   BNP (last 3 results) No results for input(s): PROBNP in the last 8760 hours. HbA1C: No results for input(s): HGBA1C in the last 72 hours. CBG: No results for input(s): GLUCAP in the last 168 hours. Lipid  Profile: No results for input(s): CHOL, HDL, LDLCALC, TRIG, CHOLHDL, LDLDIRECT in the last 72 hours. Thyroid Function Tests: No results for input(s): TSH, T4TOTAL, FREET4, T3FREE, THYROIDAB in the last 72 hours. Anemia Panel: No results for input(s): VITAMINB12, FOLATE, FERRITIN, TIBC, IRON, RETICCTPCT in the last 72 hours. Urine analysis:    Component Value Date/Time   COLORURINE STRAW (A) 07/19/2017 0850   APPEARANCEUR CLEAR 07/19/2017 0850   LABSPEC 1.005 07/19/2017 0850   PHURINE 5.0 07/19/2017 0850   GLUCOSEU  NEGATIVE 07/19/2017 0850   HGBUR NEGATIVE 07/19/2017 0850   BILIRUBINUR NEGATIVE 07/19/2017 0850   KETONESUR NEGATIVE 07/19/2017 0850   PROTEINUR NEGATIVE 07/19/2017 0850   NITRITE NEGATIVE 07/19/2017 0850   LEUKOCYTESUR NEGATIVE 07/19/2017 0850    Radiological Exams on Admission: Dg Chest Portable 1 View  Result Date: 08/04/2017 CLINICAL DATA:  Shortness of breath.  Abnormal EKG.  Cough. EXAM: PORTABLE CHEST 1 VIEW COMPARISON:  07/19/2017 FINDINGS: Low lung volumes. Chronic cardiomegaly. Unchanged mediastinal contours. Perihilar opacities with slight improvement on the left from prior exam. Persistent peripheral right midlung opacity, improved. Bilateral pleural effusions and bibasilar opacities. No pneumothorax. IMPRESSION: Persistent perihilar opacities with slight improvement on the left from prior exam, likely pulmonary edema. Improved right mid lung consolidation. Cardiomegaly with bilateral pleural effusions and bibasilar airspace disease again seen. Overall findings likely represent an element of CHF, with superimposed improving pneumonia. Electronically Signed   By: Rubye Oaks M.D.   On: 08/04/2017 22:37    EKG: Independently reviewed. Afib with RVR noted.   Assessment/Plan Atrial fibrillation with RVR - HR in the 120s - 130s.  - Started on Cardizem drip in the ED - Continue eliquis - Monitor for worsening symptoms - Consult cardiology in the AM for  recommendations - Hold metoprolol while on cardizem  Acute on Chronic systolic CHF (congestive heart failure) - Due to noncompliance with lasix, and likely with metoprolol - IV lasix 60mg  BID - Discuss further with cardiology - Symptoms include SOB, DOE, orthopnea, PND and LE swelling - Weight, however, is stable from recent cardiology appointment - Daily weights, strict I/Os - Consider repeat TTE, however last one done last month showed EF of 35 - 40%    History of gout - Currently on no therapy, monitor for acute symptoms while on diuretics    CKD - At baseline today, monitor daily while on diuretics.   Hyperglycemia - Last A1C in 2015 was 5.1 - Check A1C, consider SSI if glucose continues to run high  Leukocytosis - Possibly reactive, he denies any acute febrile illness but he is recovering from pneumonia - Trend.    DVT prophylaxis: Eliquis Code Status: Full Disposition Plan: Admit for diuresis Consults called: Will need cardiology in the AM Admission status: SDU, inpatient   Debe Coder MD [facility] Pager [phone]  If 7PM-7AM, please contact night-coverage [email or web address] Password [email or web address]  08/05/2017, 12:40 AM

## 2017-08-05 ENCOUNTER — Encounter (HOSPITAL_COMMUNITY): Payer: Self-pay | Admitting: Internal Medicine

## 2017-08-05 DIAGNOSIS — I480 Paroxysmal atrial fibrillation: Secondary | ICD-10-CM | POA: Diagnosis present

## 2017-08-05 DIAGNOSIS — R739 Hyperglycemia, unspecified: Secondary | ICD-10-CM | POA: Diagnosis present

## 2017-08-05 DIAGNOSIS — I4891 Unspecified atrial fibrillation: Secondary | ICD-10-CM | POA: Diagnosis not present

## 2017-08-05 DIAGNOSIS — X58XXXA Exposure to other specified factors, initial encounter: Secondary | ICD-10-CM | POA: Diagnosis present

## 2017-08-05 DIAGNOSIS — I420 Dilated cardiomyopathy: Secondary | ICD-10-CM | POA: Diagnosis present

## 2017-08-05 DIAGNOSIS — I5023 Acute on chronic systolic (congestive) heart failure: Principal | ICD-10-CM

## 2017-08-05 DIAGNOSIS — N183 Chronic kidney disease, stage 3 unspecified: Secondary | ICD-10-CM

## 2017-08-05 DIAGNOSIS — Z91128 Patient's intentional underdosing of medication regimen for other reason: Secondary | ICD-10-CM | POA: Diagnosis not present

## 2017-08-05 DIAGNOSIS — J189 Pneumonia, unspecified organism: Secondary | ICD-10-CM | POA: Diagnosis present

## 2017-08-05 DIAGNOSIS — Z9111 Patient's noncompliance with dietary regimen: Secondary | ICD-10-CM | POA: Diagnosis not present

## 2017-08-05 DIAGNOSIS — Z7901 Long term (current) use of anticoagulants: Secondary | ICD-10-CM | POA: Diagnosis not present

## 2017-08-05 DIAGNOSIS — T447X6A Underdosing of beta-adrenoreceptor antagonists, initial encounter: Secondary | ICD-10-CM | POA: Diagnosis present

## 2017-08-05 DIAGNOSIS — T501X6A Underdosing of loop [high-ceiling] diuretics, initial encounter: Secondary | ICD-10-CM | POA: Diagnosis present

## 2017-08-05 LAB — CBC WITH DIFFERENTIAL/PLATELET
BASOS ABS: 0 10*3/uL (ref 0.0–0.1)
BASOS PCT: 0 %
EOS ABS: 0 10*3/uL (ref 0.0–0.7)
EOS PCT: 0 %
HCT: 47.3 % (ref 39.0–52.0)
Hemoglobin: 15.6 g/dL (ref 13.0–17.0)
Lymphocytes Relative: 14 %
Lymphs Abs: 1.3 10*3/uL (ref 0.7–4.0)
MCH: 28.5 pg (ref 26.0–34.0)
MCHC: 33 g/dL (ref 30.0–36.0)
MCV: 86.3 fL (ref 78.0–100.0)
Monocytes Absolute: 0.7 10*3/uL (ref 0.1–1.0)
Monocytes Relative: 7 %
Neutro Abs: 7.3 10*3/uL (ref 1.7–7.7)
Neutrophils Relative %: 79 %
PLATELETS: 241 10*3/uL (ref 150–400)
RBC: 5.48 MIL/uL (ref 4.22–5.81)
RDW: 14.5 % (ref 11.5–15.5)
WBC: 9.3 10*3/uL (ref 4.0–10.5)

## 2017-08-05 LAB — HEMOGLOBIN A1C
HEMOGLOBIN A1C: 6.3 % — AB (ref 4.8–5.6)
Mean Plasma Glucose: 134.11 mg/dL

## 2017-08-05 LAB — BRAIN NATRIURETIC PEPTIDE: B Natriuretic Peptide: 1922.3 pg/mL — ABNORMAL HIGH (ref 0.0–100.0)

## 2017-08-05 LAB — MRSA PCR SCREENING: MRSA BY PCR: NEGATIVE

## 2017-08-05 MED ORDER — ACETAMINOPHEN 325 MG PO TABS: 650.0000 mg | ORAL_TABLET | ORAL | Status: DC | PRN

## 2017-08-05 MED ORDER — METOPROLOL SUCCINATE ER 100 MG PO TB24: 100.0000 mg | ORAL_TABLET | Freq: Two times a day (BID) | ORAL | Status: DC

## 2017-08-05 MED ORDER — ONDANSETRON HCL 4 MG/2ML IJ SOLN: 4.0000 mg | Freq: Four times a day (QID) | INTRAMUSCULAR | Status: DC | PRN

## 2017-08-05 MED ORDER — SODIUM CHLORIDE 0.9% FLUSH
3.0000 mL | Freq: Two times a day (BID) | INTRAVENOUS | Status: DC
  Administered 2017-08-05 – 2017-08-07 (×5): 3 mL via INTRAVENOUS

## 2017-08-05 MED ORDER — SODIUM CHLORIDE 0.9% FLUSH: 3.0000 mL | INTRAVENOUS | Status: DC | PRN

## 2017-08-05 MED ORDER — APIXABAN 5 MG PO TABS
5.0000 mg | ORAL_TABLET | Freq: Two times a day (BID) | ORAL | Status: DC
  Administered 2017-08-05 – 2017-08-07 (×5): 5 mg via ORAL
  Filled 2017-08-05 (×5): qty 1

## 2017-08-05 MED ORDER — SODIUM CHLORIDE 0.9 % IV SOLN: 250.0000 mL | INTRAVENOUS | Status: DC | PRN

## 2017-08-05 MED ORDER — FUROSEMIDE 10 MG/ML IJ SOLN
60.0000 mg | Freq: Two times a day (BID) | INTRAMUSCULAR | Status: DC
  Administered 2017-08-05 – 2017-08-07 (×5): 60 mg via INTRAVENOUS
  Filled 2017-08-05 (×5): qty 6

## 2017-08-05 MED ORDER — METOPROLOL SUCCINATE ER 100 MG PO TB24
100.0000 mg | ORAL_TABLET | Freq: Every day | ORAL | Status: DC
  Administered 2017-08-05 – 2017-08-06 (×2): 100 mg via ORAL
  Filled 2017-08-05 (×2): qty 1

## 2017-08-05 NOTE — Progress Notes (Signed)
Pt diltiazem drip at 5 mg/hr stopped due to standing orders for pauses > 2 seconds~2.12.  Heart rate dropping to 40s and 50s.  Pt continues to sustain afib with heart rate 60s-70s.  [place], NP notified via text page.  Will continue to monitor closely.

## 2017-08-05 NOTE — Progress Notes (Signed)
  PROGRESS NOTE  Jeffery Christian ZTI:[id number] DOB: [birth year] DOA: 08/04/2017 PCP: Patient, No Pcp Per  Brief Narrative: 65yom PMH PAF, systolic CHF presented with SOB. Admitted for Afib/RVR, acute CHF  Assessment/Plan Acute on chronic systolic CHF. Echo 10/18 showed LVEF 35-40%. 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 Secondary to non-compliance with furosemide and dietary noncompliance -improving. Continue furosemide -consult dietician for diet education  Afib RVR, PMH PAF. CHA2DS2-VASc 1. DCCV planned in 4 weeks (week of 11/19) -stable. Continue apixaban.  - resume metoprolol XL at 1/2 dose of 100 mg daily.  CKD stage III -at baseline. Daily BMP while on Lasix.   Improving, likely home next 48 hours  DVT prophylaxis: apixaban Code Status: full Family Communication: none Disposition Plan: home    Jeffery Sacks, MD  [facility] Direct contact: [phone] --Via amion app OR  --[email or web address]; password [email or web address]  7PM-7AM contact night coverage as above 08/05/2017, 10:25 AM  LOS: 0 days   Consultants:  Cardiology   Procedures:    Antimicrobials:    Interval history/Subjective: Dilt gtt stopped secondary to 2s pauses with HR into 40-50s.   Feeling better but still SOB. Still has LE edema.  Objective: Vitals:  Vitals:   08/05/17 0627 08/05/17 0801  BP: 121/89 (!) 118/95  Pulse: 78 73  Resp:  19  Temp: (!) 97.5 F (36.4 C) (!) 97.5 F (36.4 C)  SpO2: 98% 96%    Exam:  Constitutional:   . Appears calm and comfortable sitting in chair Eyes:  . pupils and irises appear normal . Normal lids  ENMT:  . grossly normal hearing  . Lips appear normal Respiratory:  . Bilateral posterior rales, no w/rhonchi. Marland Kitchen Respiratory effort normal.  Cardiovascular:  . Irregular, normal rate, no m/r/g . 2+ BLE extremity edema   Musculoskeletal:  . Digits/nails BUE: no clubbing, cyanosis, petechiae, infection Skin:  . No rashes, lesions, ulcers . palpation of skin: no  induration or nodules Psychiatric:  . Mental status o Mood, affect appropriate  I have personally reviewed the following:  Filed Weights   08/04/17 2129 08/05/17 0158 08/05/17 0627  Weight: 99.8 kg (220 lb) 100.3 kg (221 lb 3.2 oz) 99.4 kg (219 lb 3.2 oz)   Weight change:   UOP: 1500 I/O: -1000 Last BM: unrecorded Foley: no Telemetry: afib  Labs:  BNP 1922  Troponin negative  CBC unremarkable  Imaging studies:  CXR CHF, resolving pneumonia  Medical tests:  EKG afib/flutter with RVR   Test discussed with performing physician:    Decision to obtain old records:    Review and summation of old records:  Cardiology visit 10/31. incrase Lasix x4 days, increase metoprolol to 200mg  daily, continue apixaban, DCCV in 4 weeks (week of 11/19)  Scheduled Meds: . apixaban  5 mg Oral BID  . furosemide  60 mg Intravenous Q12H  . metoprolol succinate  100 mg Oral Daily  . sodium chloride flush  3 mL Intravenous Q12H   Continuous Infusions: . sodium chloride      Principal Problem:   Acute on chronic systolic CHF (congestive heart failure) (HCC) Active Problems:   History of gout   Persistent atrial fibrillation (HCC)   Atrial fibrillation with RVR (HCC)   CKD (chronic kidney disease), stage III (HCC)   LOS: 0 days

## 2017-08-05 NOTE — Progress Notes (Signed)
Pt stated he was not taking fluid pill at home as prescribed.  Pt also stated he ate out at restaurants 3 times a day.  Pt educated on medication regimen adherence and diet modification regarding sodium content present in restaurant food.  Will provide patient education on heart failure and atrial fibrillation.

## 2017-08-06 ENCOUNTER — Other Ambulatory Visit: Payer: Self-pay

## 2017-08-06 LAB — BASIC METABOLIC PANEL
ANION GAP: 12 (ref 5–15)
BUN: 34 mg/dL — AB (ref 6–20)
CALCIUM: 9.5 mg/dL (ref 8.9–10.3)
CO2: 31 mmol/L (ref 22–32)
Chloride: 100 mmol/L — ABNORMAL LOW (ref 101–111)
Creatinine, Ser: 1.9 mg/dL — ABNORMAL HIGH (ref 0.61–1.24)
GFR calc Af Amer: 41 mL/min — ABNORMAL LOW (ref >60)
GFR, EST NON AFRICAN AMERICAN: 36 mL/min — AB (ref >60)
GLUCOSE: 74 mg/dL (ref 65–99)
Potassium: 4.3 mmol/L (ref 3.5–5.1)
Sodium: 143 mmol/L (ref 135–145)

## 2017-08-06 MED ORDER — POLYETHYLENE GLYCOL 3350 17 G PO PACK
17.0000 g | PACK | Freq: Two times a day (BID) | ORAL | Status: DC
  Filled 2017-08-06: qty 1

## 2017-08-06 MED ORDER — METOPROLOL SUCCINATE ER 100 MG PO TB24
100.0000 mg | ORAL_TABLET | Freq: Once | ORAL | Status: AC
  Administered 2017-08-06: 100 mg via ORAL
  Filled 2017-08-06: qty 1

## 2017-08-06 MED ORDER — SENNA 8.6 MG PO TABS: 1.0000 | ORAL_TABLET | Freq: Every day | ORAL | Status: DC

## 2017-08-06 MED ORDER — METOPROLOL SUCCINATE ER 100 MG PO TB24
200.0000 mg | ORAL_TABLET | Freq: Every day | ORAL | Status: DC
  Administered 2017-08-07: 200 mg via ORAL
  Filled 2017-08-06: qty 2

## 2017-08-06 NOTE — Progress Notes (Signed)
Nutrition Education Note  RD consulted for nutrition education regarding CHF.  RD provided "Low Sodium Nutrition Therapy" handout from the Academy of [employer]. Reviewed patient's dietary recall. Provided examples on ways to decrease sodium intake in diet. Discouraged intake of processed foods and use of salt shaker. Encouraged fresh fruits and vegetables as well as whole grain sources of carbohydrates to maximize fiber intake.   RD discussed why it is important for patient to adhere to diet recommendations, and emphasized the role of fluids, foods to avoid, and importance of weighing self daily. Teach back method used.  Expect poor compliance. Patient was not willing to make any dietary changes or prepare any meals at home.  Body mass index is 29.17 kg/m. Pt meets criteria for overweight based on current BMI.  Current diet order is heart healthy, patient is consuming approximately 100% of meals at this time. Labs and medications reviewed. No further nutrition interventions warranted at this time. RD contact information provided. If additional nutrition issues arise, please re-consult RD.   Joaquin Courts, RD, LDN, CNSC Pager [phone] After Hours Pager [phone]

## 2017-08-06 NOTE — H&P (View-Only) (Signed)
  PROGRESS NOTE  Jeffery Christian MRN:[id number] DOB: 09/21/1951 DOA: 08/04/2017 PCP: Patient, No Pcp Per  Brief Narrative: 65yom PMH PAF, systolic CHF presented with SOB. Admitted for Afib/RVR, acute CHF  Assessment/Plan Acute on chronic systolic CHF. Echo 10/18 showed LVEF 35-40%. 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 Secondary to non-compliance with furosemide and dietary noncompliance - much improved but still has significant volume overload - does not take ownership of dietary or medication non-complicance - dietician consult for diet education  Afib RVR, PMH PAF. CHA2DS2-VASc 1. DCCV planned in 4 weeks (week of 11/19) - mild tachycardia. BP stable. Increase metoprolol XL to 100 mg dialy - continue apixaban 11/5  CKD stage III - remains at baseline. Daily BMP while on Lasix, next 11/6   Improving, likely home tomorrow  DVT prophylaxis: apixaban Code Status: full Family Communication:  Disposition Plan: home    Olive Motyka, MD  [facility] Direct contact: [phone] --Via amion app OR  --[email or web address]; password [email or web address]  7PM-7AM contact night coverage as above 08/06/2017, 10:26 AM  LOS: 1 day   Consultants:  Cardiology   Procedures:    Antimicrobials:    Interval history/Subjective: Much better. Breathing fine. LE edema decreasing rapidly.  Objective: Vitals:  Vitals:   08/06/17 0821 08/06/17 0945  BP: 118/90   Pulse:  95  Resp:    Temp: (!) 97.2 F (36.2 C)   SpO2:      Exam:  Constitutional:  . Appears calm and comfortable sitting in chair Eyes:  . pupils and irises appear normal ENMT:  . grossly normal hearing  Respiratory:  . CTA bilaterally, no w/r/r.  . Respiratory effort normal.  Cardiovascular:  . Irregular, tachycardic, no m/r/g . 1+ BLE extremity edema   Musculoskeletal:  . Digits/nails BUE: no clubbing, cyanosis, petechiae, infection . RUE, LUE, RLE, LLE   o strength and tone normal, no atrophy, no abnormal movements Skin:  . No  rashes, lesions, ulcers Psychiatric:  . judgement and insight appear normal . Mental status o Mood, affect appropriate   I have personally reviewed the following:  Filed Weights   08/05/17 0158 08/05/17 0627 08/06/17 0415  Weight: 100.3 kg (221 lb 3.2 oz) 99.4 kg (219 lb 3.2 oz) 97.6 kg (215 lb 1.6 oz)   Weight change: -5.851 kg (-14.4 oz)  UOP: 2375 I/O since admission: -2145 Last BM: unrecorded Foley: no Telemetry: afib/RVR  Labs:  BMP stable, creatinine at baseline  Imaging studies:    Medical tests:    Test discussed with performing physician:    Decision to obtain old records:    Review and summation of old records:  Cardiology visit 10/31. incrase Lasix x4 days, increase metoprolol to 200mg daily, continue apixaban, DCCV in 4 weeks (week of 11/19)  Scheduled Meds: . apixaban  5 mg Oral BID  . furosemide  60 mg Intravenous Q12H  . metoprolol succinate  100 mg Oral Daily  . sodium chloride flush  3 mL Intravenous Q12H   Continuous Infusions: . sodium chloride      Principal Problem:   Acute on chronic systolic CHF (congestive heart failure) (HCC) Active Problems:   History of gout   Persistent atrial fibrillation (HCC)   Atrial fibrillation with RVR (HCC)   CKD (chronic kidney disease), stage III (HCC)   LOS: 1 day          

## 2017-08-06 NOTE — Plan of Care (Signed)
Continue IV diuretics and education

## 2017-08-06 NOTE — Progress Notes (Signed)
  PROGRESS NOTE  Jeffery Christian ZGY:[id number] DOB: September 18, 1951 DOA: 08/04/2017 PCP: Patient, No Pcp Per  Brief Narrative: 65yom PMH PAF, systolic CHF presented with SOB. Admitted for Afib/RVR, acute CHF  Assessment/Plan Acute on chronic systolic CHF. Echo 10/18 showed LVEF 35-40%. 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 Secondary to non-compliance with furosemide and dietary noncompliance - much improved but still has significant volume overload - does not take ownership of dietary or medication non-complicance - dietician consult for diet education  Afib RVR, PMH PAF. CHA2DS2-VASc 1. DCCV planned in 4 weeks (week of 11/19) - mild tachycardia. BP stable. Increase metoprolol XL to 100 mg dialy - continue apixaban 11/5  CKD stage III - remains at baseline. Daily BMP while on Lasix, next 11/6   Improving, likely home tomorrow  DVT prophylaxis: apixaban Code Status: full Family Communication:  Disposition Plan: home    Brendia Sacks, MD  [facility] Direct contact: [phone] --Via amion app OR  --[email or web address]; password [email or web address]  7PM-7AM contact night coverage as above 08/06/2017, 10:26 AM  LOS: 1 day   Consultants:  Cardiology   Procedures:    Antimicrobials:    Interval history/Subjective: Much better. Breathing fine. LE edema decreasing rapidly.  Objective: Vitals:  Vitals:   08/06/17 0821 08/06/17 0945  BP: 118/90   Pulse:  95  Resp:    Temp: (!) 97.2 F (36.2 C)   SpO2:      Exam:  Constitutional:  . Appears calm and comfortable sitting in chair Eyes:  . pupils and irises appear normal ENMT:  . grossly normal hearing  Respiratory:  . CTA bilaterally, no w/r/r.  . Respiratory effort normal.  Cardiovascular:  . Irregular, tachycardic, no m/r/g . 1+ BLE extremity edema   Musculoskeletal:  . Digits/nails BUE: no clubbing, cyanosis, petechiae, infection . RUE, LUE, RLE, LLE   o strength and tone normal, no atrophy, no abnormal movements Skin:  . No  rashes, lesions, ulcers Psychiatric:  . judgement and insight appear normal . Mental status o Mood, affect appropriate   I have personally reviewed the following:  Filed Weights   08/05/17 0158 08/05/17 0627 08/06/17 0415  Weight: 100.3 kg (221 lb 3.2 oz) 99.4 kg (219 lb 3.2 oz) 97.6 kg (215 lb 1.6 oz)   Weight change: -5.851 kg (-14.4 oz)  UOP: 2375 I/O since admission: -2145 Last BM: unrecorded Foley: no Telemetry: afib/RVR  Labs:  BMP stable, creatinine at baseline  Imaging studies:    Medical tests:    Test discussed with performing physician:    Decision to obtain old records:    Review and summation of old records:  Cardiology visit 10/31. incrase Lasix x4 days, increase metoprolol to 200mg  daily, continue apixaban, DCCV in 4 weeks (week of 11/19)  Scheduled Meds: . apixaban  5 mg Oral BID  . furosemide  60 mg Intravenous Q12H  . metoprolol succinate  100 mg Oral Daily  . sodium chloride flush  3 mL Intravenous Q12H   Continuous Infusions: . sodium chloride      Principal Problem:   Acute on chronic systolic CHF (congestive heart failure) (HCC) Active Problems:   History of gout   Persistent atrial fibrillation (HCC)   Atrial fibrillation with RVR (HCC)   CKD (chronic kidney disease), stage III (HCC)   LOS: 1 day

## 2017-08-07 ENCOUNTER — Telehealth: Payer: Self-pay | Admitting: General Practice

## 2017-08-07 LAB — BASIC METABOLIC PANEL
Anion gap: 8 (ref 5–15)
BUN: 34 mg/dL — AB (ref 6–20)
CHLORIDE: 101 mmol/L (ref 101–111)
CO2: 30 mmol/L (ref 22–32)
CREATININE: 1.83 mg/dL — AB (ref 0.61–1.24)
Calcium: 9.1 mg/dL (ref 8.9–10.3)
GFR calc non Af Amer: 37 mL/min — ABNORMAL LOW (ref >60)
GFR, EST AFRICAN AMERICAN: 43 mL/min — AB (ref >60)
Glucose, Bld: 110 mg/dL — ABNORMAL HIGH (ref 65–99)
POTASSIUM: 3.9 mmol/L (ref 3.5–5.1)
SODIUM: 139 mmol/L (ref 135–145)

## 2017-08-07 MED ORDER — GUAIFENESIN-DM 100-10 MG/5ML PO SYRP
15.0000 mL | ORAL_SOLUTION | ORAL | Status: DC | PRN
  Administered 2017-08-07: 15 mL via ORAL
  Filled 2017-08-07: qty 15

## 2017-08-07 NOTE — Progress Notes (Signed)
Pt requesting medication for cough. Ordered PRN Robitussin DM via cardiology prn's order. Will continue to monitor.  Viviano Simas, RN

## 2017-08-07 NOTE — Discharge Instructions (Signed)
Heart Failure Action Plan A heart failure action plan helps you understand what to do when you have symptoms of heart failure. Follow the plan that was created by you and your health care provider. Review your plan each time you visit your health care provider. Red zone These signs and symptoms mean you should get medical help right away:  You have trouble breathing when resting.  You have a dry cough that is getting worse.  You have swelling or pain in your legs or abdomen that is getting worse.  You suddenly gain more than 2-3 lb (0.9-1.4 kg) in a day, or more than 5 lb (2.3 kg) in one week. This amount may be more or less depending on your condition.  You have trouble staying awake or you feel confused.  You have chest pain.  You do not have an appetite.  You pass out.  If you experience any of these symptoms:  Call your local emergency services (911 in the [place] right away or seek help at the emergency department of the nearest hospital.  Yellow zone These signs and symptoms mean your condition may be getting worse and you should make some changes:  You have trouble breathing when you are active or you need to sleep with extra pillows.  You have swelling in your legs or abdomen.  You gain 2-3 lb (0.9-1.4 kg) in one day, or 5 lb (2.3 kg) in one week. This amount may be more or less depending on your condition.  You get tired easily.  You have trouble sleeping.  You have a dry cough.  If you experience any of these symptoms:  Contact your health care provider within the next day.  Your health care provider may adjust your medicines.  Green zone These signs mean you are doing well and can continue what you are doing:  You do not have shortness of breath.  You have very little swelling or no new swelling.  Your weight is stable (no gain or loss).  You have a normal activity level.  You do not have chest pain or any other new symptoms.  Follow these  instructions at home:  Take over-the-counter and prescription medicines only as told by your health care provider.  Weigh yourself daily. Your target weight is __________ lb (__________ kg). ? Call your health care provider if you gain more than __________ lb (__________ kg) in a day, or more than __________ lb (__________ kg) in one week.  Eat a heart-healthy diet. Work with a diet and nutrition specialist (dietitian) to create an eating plan that is best for you.  Keep all follow-up visits as told by your health care provider. This is important. Where to find more information:  American Heart Association: www.heart.org Summary  Follow the action plan that was created by you and your health care provider.  Get help right away if you have any symptoms in the Red zone. This information is not intended to replace advice given to you by your health care provider. Make sure you discuss any questions you have with your health care provider. Document Released: 10/28/2016 Document Revised: 10/28/2016 Document Reviewed: 10/28/2016 Elsevier Interactive Patient Education  2018 [employer]  

## 2017-08-07 NOTE — Care Management Note (Signed)
Case Management Note  Patient Details  Name: Jeffery Christian MRN: [id number] Date of Birth: Jun 24, 1951  Subjective/Objective:  Pt presented for Acute on Chronic CHF. Pt is from home alone and has support of friends. Pt missed his last appointment @ the [facility] and [facility]. Now is appropriate for (TCC) Transitional Care Clinic with Dr Venetia Night.                   Action/Plan: Appointment was scheduled and placed on the AVS. No further needs from CM at this time.   Expected Discharge Date:  08/07/17               Expected Discharge Plan:  Home/Self Care  In-House Referral:  NA  Discharge planning Services  CM Consult, Indigent Health Clinic, Medication Assistance, Follow-up appt scheduled  Post Acute Care Choice:  NA Choice offered to:  NA  DME Arranged:  N/A DME Agency:  NA  HH Arranged:  NA HH Agency:  NA  Status of Service:  Completed, signed off  If discussed at Long Length of Stay Meetings, dates discussed:    Additional Comments:  Gala Lewandowsky, RN 08/07/2017, 11:02 AM

## 2017-08-07 NOTE — Discharge Summary (Signed)
Physician Discharge Summary  Jeffery Christian RUE:[id number] DOB: 06-Aug-1951 DOA: 08/04/2017  PCP: Patient, No Pcp Per  Admit date: 08/04/2017 Discharge date: 08/07/2017  Recommendations for Outpatient Follow-up:  1. F/u CHF, noncompliance with Lasix and CHF diet 2. F/u afib with plan for DCCV.   Follow-up Information    Pricilla Riffleoss, Paula V, MD. Schedule an appointment as soon as possible for a visit in 1 week(s).   Specialty:  Cardiology Contact information: [address] [place] [address] [phone]        [facility] Follow up on 08/16/2017.   Why:  @ 9:45 am with Dr. Venetia NightAmao for Transitional Care Clinic Appointment. Pt can also utilize the Pharmacy for medication assistance. Medications range from $4.00-$10.00.  Contact information: [address] [place] [phone]           Discharge Diagnoses:  1. Acute on chronic systolic CHF 2. Afib RVR 3. CKD stage III  Discharge Condition: improved Disposition: home  Diet recommendation: heart healthy, low salt  Filed Weights   08/05/17 0627 08/06/17 0415 08/07/17 0557  Weight: 99.4 kg (219 lb 3.2 oz) 97.6 kg (215 lb 1.6 oz) 94.9 kg (209 lb 3.2 oz)    History of present illness:  65yom PMH PAF, systolic CHF presented with SOB. Admitted for Afib/RVR, acute CHF  Hospital Course:  Patient rapidly improved with aggressive diuresis and is now euvolemic. HR control improved with diuresis and metoprolol. Hospitalization was uncomplicated. See individual issues below.  Acute on chronic systolic CHF. Echo 10/18 showed LVEF 35-40%. 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 Secondary to non-compliance with furosemide and dietary noncompliance -  much improved, now euvolemic, change to oral Lasix - does not take ownership of dietary or medication non-complicance - dietician consult for diet education appreciated--poor compliance anticipated  Afib RVR, PMH PAF.  CHA2DS2-VASc 1. DCCV planned in 4 weeks (week of 11/19) - continue BB on discharge at dose recommended on last cardiology visit - continue apixaban on discharge  CKD stage III - remained at baseline   Today's assessment: S: feels fine. Breathing fine.  O: Vitals:  Vitals:   08/07/17 0020 08/07/17 0545  BP: 120/88 122/90  Pulse: 60 74  Resp: 20 18  Temp: 98 F (36.7 C) 97.9 F (36.6 C)  SpO2: 97% 94%    Constitutional:  . Appears calm and comfortable sitting in chair Respiratory:  . CTA bilaterally, no w/r/r.  . Respiratory effort normal.  Cardiovascular:  . irregular, no m/r/g . No LLE extremity edema, trace right pedal edema Psychiatric:  . Mental status o Mood, affect appropriate  UOP: unrecorded I/O since admission: inaccurate Last BM: 11/5 Foley: no Telemetry: atrial fibrillation  Labs:  BMP stable. Creatinine at baseline.    Discharge Instructions  Discharge Instructions    Diet - low sodium heart healthy   Complete by:  As directed    Discharge instructions   Complete by:  As directed    Call your physician or seek immediate medical attention for pain, shortness of breath, weight gain, swelling or worsening of condition.   Increase activity slowly   Complete by:  As directed      Allergies as of 08/07/2017   No Known Allergies     Medication List    TAKE these medications   apixaban 5 MG Tabs tablet Commonly known as:  ELIQUIS Take 1 tablet (5 mg total) by mouth 2 (two) times daily.   furosemide 80 MG  tablet Commonly known as:  LASIX Take 1 tablet (80 mg total) by mouth daily.   metoprolol succinate 100 MG 24 hr tablet Commonly known as:  TOPROL-XL Take 2 tablets (200 mg total) by mouth daily. Take with or immediately following a meal. What changed:    how much to take  when to take this  additional instructions      No Known Allergies  The results of significant diagnostics from this hospitalization (including imaging,  microbiology, ancillary and laboratory) are listed below for reference.    Significant Diagnostic Studies: Dg Chest Portable 1 View  Result Date: 08/04/2017 CLINICAL DATA:  Shortness of breath.  Abnormal EKG.  Cough. EXAM: PORTABLE CHEST 1 VIEW COMPARISON:  07/19/2017 FINDINGS: Low lung volumes. Chronic cardiomegaly. Unchanged mediastinal contours. Perihilar opacities with slight improvement on the left from prior exam. Persistent peripheral right midlung opacity, improved. Bilateral pleural effusions and bibasilar opacities. No pneumothorax. IMPRESSION: Persistent perihilar opacities with slight improvement on the left from prior exam, likely pulmonary edema. Improved right mid lung consolidation. Cardiomegaly with bilateral pleural effusions and bibasilar airspace disease again seen. Overall findings likely represent an element of CHF, with superimposed improving pneumonia. Electronically Signed   By: Rubye Oaks M.D.   On: 08/04/2017 22:37    Microbiology: Recent Results (from the past 240 hour(s))  MRSA PCR Screening     Status: None   Collection Time: 08/05/17  1:40 AM  Result Value Ref Range Status   MRSA by PCR NEGATIVE NEGATIVE Final    Comment:        The GeneXpert MRSA Assay (FDA approved for NASAL specimens only), is one component of a comprehensive MRSA colonization surveillance program. It is not intended to diagnose MRSA infection nor to guide or monitor treatment for MRSA infections.      Labs: Basic Metabolic Panel: Recent Labs  Lab 08/01/17 1036 08/04/17 2132 08/06/17 0423 08/07/17 0402  NA 142 139 143 139  K 5.1 5.1 4.3 3.9  CL 105 111 100* 101  CO2 23 19* 31 30  GLUCOSE 82 123* 74 110*  BUN 36* 28* 34* 34*  CREATININE 1.88* 1.64* 1.90* 1.83*  CALCIUM 8.9 8.6* 9.5 9.1   LCBC: Recent Labs  Lab 08/04/17 2132 08/05/17 0508  WBC 11.8* 9.3  NEUTROABS  --  7.3  HGB 15.5 15.6  HCT 47.8 47.3  MCV 86.8 86.3  PLT 255 241   Cardiac Enzymes: Recent  Labs  Lab 08/04/17 2132  TROPONINI <0.03    Recent Labs    07/18/17 1629 08/04/17 2132  BNP 845.9* 1,922.3*     Principal Problem:   Acute on chronic systolic CHF (congestive heart failure) (HCC) Active Problems:   History of gout   Persistent atrial fibrillation (HCC)   Atrial fibrillation with RVR (HCC)   CKD (chronic kidney disease), stage III (HCC)   Time coordinating discharge: 35 minutes  Signed:  Brendia Sacks, MD [facility] 08/07/2017, 11:34 AM

## 2017-08-07 NOTE — Telephone Encounter (Signed)
Met with patient before being discharged. Introduce myself and informed him of the TCC clinic at [facility]. Also informed patient of the services available to him (pharmacy, social worker, financial assistance, primary care). Patient agreed to an appointment with Dr. Jarold Song. Informed him that appointment will be written on his AVS discharge documents. Appointment date: Thursday Nov. 15,2018 @ 9:45am.   Patient informed me that he lives alone at home, and prefers it to be this way. He maintains contact with some family members but is closer to his friends. Patient confirmed that [place] (daughter) is his emergency contact and confirmed her phone [phone]. Patient also confirmed that the address and phone number that we have on Epic is correct.   Patient receives a monthly payment from disability (about $1,000) to be able to pay for his bills. They Web designer) pays for his housing. He doesn't receive any food stamps. Patient is able to cook for himself but prefers to eat out. Patient is able to get to his appointments. He has two cars.   Patient is aware of his condition and stated that he has been taking his medication. Informed patient that his Medicare plan doesn't cover his prescription co pay but he can use our pharmacy, in case he is unable to afford medication. Patient has a scale at home where he can weigh himself.

## 2017-08-08 ENCOUNTER — Telehealth: Payer: Self-pay

## 2017-08-08 ENCOUNTER — Other Ambulatory Visit: Payer: Self-pay

## 2017-08-08 NOTE — Telephone Encounter (Signed)
Transitional Care Clinic Post-discharge Follow-Up Phone Call:  Date of Discharge: 08/07/2017 Principal Discharge Diagnosis(es):  Acute on chronic CHF, afib with RVR Post-discharge Communication: (Clearly document all attempts clearly and date contact made) call placed to # [phone] (M) and a HIPAA compliant voicemail message was left requesting a call back to # [phone].  Call Completed: No

## 2017-08-09 ENCOUNTER — Telehealth: Payer: Self-pay

## 2017-08-09 NOTE — Telephone Encounter (Signed)
Transitional Care Clinic Post-discharge Follow-Up Phone Call:  Date of Discharge:  08/07/2017 Principal Discharge Diagnosis(es):  CHF, Afib with RVR Post-discharge Communication: (Clearly document all attempts clearly and date contact made) call placed to # [phone] (M) and a HIPAA compliant voicemail message was left requesting a call back to # [phone] Call Completed: No

## 2017-08-13 ENCOUNTER — Telehealth: Payer: Self-pay | Admitting: *Deleted

## 2017-08-13 NOTE — Telephone Encounter (Signed)
Left message on voicemail to call patient.

## 2017-08-16 ENCOUNTER — Ambulatory Visit: Payer: Medicare Other | Attending: Family Medicine | Admitting: Family Medicine

## 2017-08-16 ENCOUNTER — Encounter: Payer: Self-pay | Admitting: Family Medicine

## 2017-08-16 VITALS — BP 122/78 | HR 91 | Temp 97.7°F | Ht 72.0 in | Wt 212.0 lb

## 2017-08-16 DIAGNOSIS — I4819 Other persistent atrial fibrillation: Secondary | ICD-10-CM

## 2017-08-16 DIAGNOSIS — Z7901 Long term (current) use of anticoagulants: Secondary | ICD-10-CM | POA: Insufficient documentation

## 2017-08-16 DIAGNOSIS — N183 Chronic kidney disease, stage 3 unspecified: Secondary | ICD-10-CM

## 2017-08-16 DIAGNOSIS — I481 Persistent atrial fibrillation: Secondary | ICD-10-CM | POA: Insufficient documentation

## 2017-08-16 DIAGNOSIS — I5023 Acute on chronic systolic (congestive) heart failure: Secondary | ICD-10-CM

## 2017-08-16 NOTE — Progress Notes (Signed)
TRANSITIONAL CARE CLINIC  Date of Telephone Encounter: 08/08/17  Date of 1st service: 08/15/17   Admit Date: 08/04/17 Discharge Date: 08/07/17    Subjective:  Patient ID: Jeffery Christian Huge, male    DOB: Oct 09, 1950  Age: 66 y.o. MRN: [id number]  CC: Hospitalization Follow-up   HPI Jeffery Christian Bertino is a 66 year old male with a history of acute on chronic CHF (EF 35-40% from 2D echo of 07/2017), Atrial Fibrillation, stage III chronic kidney disease noncompliance, who presents to establish care at the transitional care clinic after recent hospitalization for  acute on chronic CHF exacerbation.  He had presented to the ED with shortness of breath and was found to be in atrial fibrillation with rapid ventricular rhythm and had admitted to noncompliance with medications. He received IV diuresis with improvement in symptoms. His medications were reinstated and he was subsequently discharged with plans for thyroid cardioversion.  2D echo 07/19/17: Study Conclusions  - Left ventricle: The cavity size was normal. Systolic function was   moderately reduced. The estimated ejection fraction was in the   range of 35% to 40%. Diffuse hypokinesis. The study was not   technically sufficient to allow evaluation of LV diastolic   dysfunction due to atrial fibrillation. - Mitral valve: There was mild regurgitation. - Left atrium: The atrium was moderately dilated. - Right ventricle: Systolic function was normal. - Right atrium: The atrium was mildly dilated. - Pulmonary arteries: Systolic pressure was moderately increased.   PA peak pressure: 44 mm Hg (S). - Inferior vena cava: The vessel was dilated. The respirophasic   diameter changes were blunted (< 50%), consistent with elevated   central venous pressure. - Pericardium, extracardiac: There was no pericardial effusion.   There was a left pleural effusion.  Impressions:  - When compared to the prior study from 03/23/2014 LVEF has   decreased from  55-60% to 35-40%. The LVEF evaluation might be   affected by atrial fibrillation with RVR during the acquisition.    He presents today accompanied by his friend and endorses compliance with his medication but tells me once he has his  DC CV next Monday he will quit taking his medications. He is unhappy about the diuretic effects of his Lasix which caused him to stop while driving several times to use the bathroom. He  denies shortness of breath, pedal edema and has been checking his weight consistently.    Past Medical History:  Diagnosis Date  . Chronic systolic CHF (congestive heart failure) (HCC)    EF 35-40, diffuse HK, mild MR, moderate LAE, mild RAE, PASP 44, L pleural eff  . Dilated cardiomyopathy (HCC)    likely related to tachycardia  . Persistent atrial fibrillation [facility](HCC)     Past Surgical History:  Procedure Laterality Date  . INCISION AND DRAINAGE ABSCESS Left 03/22/2014   Procedure: INCISION AND DRAINAGE ABSCESS LEFT BUTTOCK ABSCESS;  Surgeon: Liz MaladyBurke E Thompson, MD;  Location: [facility] OR;  Service: General;  Laterality: Left;    No Known Allergies   Outpatient Medications Prior to Visit  Medication Sig Dispense Refill  . apixaban (ELIQUIS) 5 MG TABS tablet Take 1 tablet (5 mg total) by mouth 2 (two) times daily. 60 tablet 2  . furosemide (LASIX) 80 MG tablet Take 1 tablet (80 mg total) by mouth daily. 30 tablet 2  . metoprolol succinate (TOPROL-XL) 100 MG 24 hr tablet Take 2 tablets (200 mg total) by mouth daily. Take with or immediately following a meal. (Patient taking differently: Take  100 mg by mouth 2 (two) times daily. Take with or immediately following a meal.) 180 tablet 3   No facility-administered medications prior to visit.     ROS Review of Systems  Constitutional: Negative for activity change and appetite change.  HENT: Negative for sinus pressure and sore throat.   Eyes: Negative for visual disturbance.  Respiratory: Negative for cough, chest tightness and  shortness of breath.   Cardiovascular: Negative for chest pain and leg swelling.  Gastrointestinal: Negative for abdominal distention, abdominal pain, constipation and diarrhea.  Endocrine: Negative.   Genitourinary: Negative for dysuria.  Musculoskeletal: Negative for joint swelling and myalgias.  Skin: Negative for rash.  Allergic/Immunologic: Negative.   Neurological: Negative for weakness, light-headedness and numbness.  Psychiatric/Behavioral: Negative for dysphoric mood and suicidal ideas.    Objective:  BP 122/78   Pulse 91   Temp 97.7 F (36.5 C) (Oral)   Ht 6' (1.829 m)   Wt 212 lb (96.2 kg)   SpO2 96%   BMI 28.75 kg/m   BP/Weight 08/16/2017 08/07/2017 08/01/2017  Systolic BP 122 122 130  Diastolic BP 78 90 70  Wt. (Lbs) 212 209.2 228.8  BMI 28.75 28.37 31.03      Physical Exam  Constitutional: He is oriented to person, place, and time. He appears well-developed and well-nourished.  Cardiovascular: Normal rate, normal heart sounds and intact distal pulses.  No murmur heard. Pulmonary/Chest: Effort normal and breath sounds normal. He has no wheezes. He has no rales. He exhibits no tenderness.  Abdominal: Soft. Bowel sounds are normal. He exhibits no distension and no mass. There is no tenderness.  Musculoskeletal: Normal range of motion.  Neurological: He is alert and oriented to person, place, and time.  Skin: Skin is warm and dry.  Psychiatric: He has a normal mood and affect.     CMP Latest Ref Rng & Units 08/07/2017 08/06/2017 08/04/2017  Glucose 65 - 99 mg/dL 161(W) 74 960(A)  BUN 6 - 20 mg/dL 54(U) 98(J) 19(J)  Creatinine 0.61 - 1.24 mg/dL 4.78(G) 9.56(O) 1.30(Q)  Sodium 135 - 145 mmol/L 139 143 139  Potassium 3.5 - 5.1 mmol/L 3.9 4.3 5.1  Chloride 101 - 111 mmol/L 101 100(L) 111  CO2 22 - 32 mmol/L 30 31 19(L)  Calcium 8.9 - 10.3 mg/dL 9.1 9.5 6.5(H)  Total Protein 6.5 - 8.1 g/dL - - -  Total Bilirubin 0.3 - 1.2 mg/dL - - -  Alkaline Phos 38 - 126  U/L - - -  AST 15 - 41 U/L - - -  ALT 17 - 63 U/L - - -     Assessment & Plan:   1. Persistent atrial fibrillation (HCC) Continue metoprolol Anticoagulation with Eliquis Scheduled for DCCV on 08/20/17  2. Acute on chronic systolic CHF (congestive heart failure) (HCC) EF of 35-40% Euvolemic Strongly advised to comply with Lasix, metoprolol, Eliquis Daily weights, low-sodium, heart healthy diet  3. CKD (chronic kidney disease), stage III (HCC) Stable with creatinine of 1.83 Avoid nephrotoxins   No orders of the defined types were placed in this encounter.   Follow-up: Return in about 5 weeks (around 09/20/2017) for follow up of chronic medical conditions.   Jaclyn Shaggy MD

## 2017-08-16 NOTE — Patient Instructions (Signed)

## 2017-08-17 ENCOUNTER — Encounter: Payer: Self-pay | Admitting: Family Medicine

## 2017-08-19 NOTE — Anesthesia Preprocedure Evaluation (Addendum)
Anesthesia Evaluation  Patient identified by MRN, date of birth, ID band Patient awake    Reviewed: Allergy & Precautions, H&P , NPO status , Patient's Chart, lab work & pertinent test results  Airway Mallampati: II  TM Distance: >3 FB Neck ROM: Full    Dental  (+) Teeth Intact, Dental Advisory Given   Pulmonary neg pulmonary ROS, neg COPD,    breath sounds clear to auscultation       Cardiovascular +CHF  (-) Past MI + dysrhythmias (A Fib with rapid ventricular response) Atrial Fibrillation  Rhythm:Irregular Rate:Normal  TTE 2018 - Ejection fraction was in the range of 35% to 40%. Diffuse hypokinesis. Mild MR, moderately dilated LA. Mildly dilated RA, normal RV, moderately increased PASP (44 mmHg). There was a left pleural effusion.   Neuro/Psych negative neurological ROS  negative psych ROS   GI/Hepatic negative GI ROS, Neg liver ROS, neg GERD  ,  Endo/Other  negative endocrine ROSneg diabetes  Renal/GU negative Renal ROS  negative genitourinary   Musculoskeletal negative musculoskeletal ROS (+)   Abdominal   Peds  Hematology negative hematology ROS (+)   Anesthesia Other Findings   Reproductive/Obstetrics                           Anesthesia Physical  Anesthesia Plan  ASA: III  Anesthesia Plan: General   Post-op Pain Management:    Induction: Intravenous  PONV Risk Score and Plan: Treatment may vary due to age or medical condition and Propofol infusion  Airway Management Planned: Natural Airway and Mask  Additional Equipment: None  Intra-op Plan:   Post-operative Plan:   Informed Consent: I have reviewed the patients History and Physical, chart, labs and discussed the procedure including the risks, benefits and alternatives for the proposed anesthesia with the patient or authorized representative who has indicated his/her understanding and acceptance.   Dental advisory  given  Plan Discussed with: CRNA  Anesthesia Plan Comments:         Anesthesia Quick Evaluation

## 2017-08-20 ENCOUNTER — Encounter (HOSPITAL_COMMUNITY)
Admission: RE | Disposition: A | Discharge status: Home / Self Care | Payer: Self-pay | Source: Ambulatory Visit | Attending: Cardiology

## 2017-08-20 ENCOUNTER — Ambulatory Visit (HOSPITAL_COMMUNITY): Payer: Medicare Other | Admitting: Anesthesiology

## 2017-08-20 ENCOUNTER — Other Ambulatory Visit: Payer: Self-pay

## 2017-08-20 ENCOUNTER — Ambulatory Visit (HOSPITAL_COMMUNITY)
Admission: RE | Admit: 2017-08-20 | Discharge: 2017-08-20 | Disposition: A | Discharge status: Home / Self Care | Payer: Medicare Other | Source: Ambulatory Visit | Attending: Cardiology | Admitting: Cardiology

## 2017-08-20 ENCOUNTER — Encounter (HOSPITAL_COMMUNITY): Payer: Self-pay | Admitting: *Deleted

## 2017-08-20 DIAGNOSIS — I480 Paroxysmal atrial fibrillation: Secondary | ICD-10-CM | POA: Insufficient documentation

## 2017-08-20 DIAGNOSIS — R001 Bradycardia, unspecified: Secondary | ICD-10-CM | POA: Diagnosis not present

## 2017-08-20 DIAGNOSIS — I4819 Other persistent atrial fibrillation: Secondary | ICD-10-CM

## 2017-08-20 DIAGNOSIS — Z7901 Long term (current) use of anticoagulants: Secondary | ICD-10-CM | POA: Diagnosis not present

## 2017-08-20 DIAGNOSIS — Z79899 Other long term (current) drug therapy: Secondary | ICD-10-CM | POA: Insufficient documentation

## 2017-08-20 DIAGNOSIS — N183 Chronic kidney disease, stage 3 (moderate): Secondary | ICD-10-CM | POA: Diagnosis not present

## 2017-08-20 DIAGNOSIS — I5023 Acute on chronic systolic (congestive) heart failure: Secondary | ICD-10-CM | POA: Insufficient documentation

## 2017-08-20 DIAGNOSIS — I5022 Chronic systolic (congestive) heart failure: Secondary | ICD-10-CM

## 2017-08-20 DIAGNOSIS — I481 Persistent atrial fibrillation: Secondary | ICD-10-CM

## 2017-08-20 HISTORY — PX: CARDIOVERSION: SHX1299

## 2017-08-20 LAB — POCT I-STAT 4, (NA,K, GLUC, HGB,HCT)
GLUCOSE: 111 mg/dL — AB (ref 65–99)
HEMATOCRIT: 52 % (ref 39.0–52.0)
HEMOGLOBIN: 17.7 g/dL — AB (ref 13.0–17.0)
Potassium: 4.1 mmol/L (ref 3.5–5.1)
Sodium: 143 mmol/L (ref 135–145)

## 2017-08-20 SURGERY — CARDIOVERSION
Anesthesia: General

## 2017-08-20 MED ORDER — PROPOFOL 10 MG/ML IV BOLUS
INTRAVENOUS | Status: DC | PRN
  Administered 2017-08-20: 100 mg via INTRAVENOUS

## 2017-08-20 MED ORDER — SODIUM CHLORIDE 0.9% FLUSH: 3.0000 mL | Freq: Two times a day (BID) | INTRAVENOUS | Status: DC

## 2017-08-20 MED ORDER — SODIUM CHLORIDE 0.9 % IV SOLN
250.0000 mL | INTRAVENOUS | Status: DC
  Administered 2017-08-20: 500 mL via INTRAVENOUS

## 2017-08-20 MED ORDER — LIDOCAINE 2% (20 MG/ML) 5 ML SYRINGE
INTRAMUSCULAR | Status: DC | PRN
  Administered 2017-08-20: 80 mg via INTRAVENOUS

## 2017-08-20 MED ORDER — HYDROCORTISONE 1 % EX CREA: 1.0000 "application " | TOPICAL_CREAM | Freq: Three times a day (TID) | CUTANEOUS | Status: DC | PRN

## 2017-08-20 MED ORDER — SODIUM CHLORIDE 0.9% FLUSH
3.0000 mL | INTRAVENOUS | Status: DC | PRN
Start: 2017-08-20 — End: 2017-08-20

## 2017-08-20 NOTE — Transfer of Care (Signed)
Immediate Anesthesia Transfer of Care Note  Patient: Jeffery Christian  Procedure(s) Performed: CARDIOVERSION (N/A )  Patient Location: Endoscopy Unit  Anesthesia Type:General  Level of Consciousness: awake, alert  and oriented  Airway & Oxygen Therapy: Patient Spontanous Breathing and Patient connected to nasal cannula oxygen  Post-op Assessment: Report given to RN, Post -op Vital signs reviewed and stable and Patient moving all extremities X 4  Post vital signs: Reviewed and stable  Last Vitals:  Vitals:   08/20/17 0801  BP: 109/82  Resp: (!) 22  Temp: 36.5 C  SpO2: 98%    Last Pain:  Vitals:   08/20/17 0801  TempSrc: Oral         Complications: No apparent anesthesia complications

## 2017-08-20 NOTE — Discharge Instructions (Signed)
Electrical Cardioversion, Care After °This sheet gives you information about how to care for yourself after your procedure. Your health care provider may also give you more specific instructions. If you have problems or questions, contact your health care provider. °What can I expect after the procedure? °After the procedure, it is common to have: °· Some redness on the skin where the shocks were given. ° °Follow these instructions at home: °· Do not drive for 24 hours if you were given a medicine to help you relax (sedative). °· Take over-the-counter and prescription medicines only as told by your health care provider. °· Ask your health care provider how to check your pulse. Check it often. °· Rest for 48 hours after the procedure or as told by your health care provider. °· Avoid or limit your caffeine use as told by your health care provider. °Contact a health care provider if: °· You feel like your heart is beating too quickly or your pulse is not regular. °· You have a serious muscle cramp that does not go away. °Get help right away if: °· You have discomfort in your chest. °· You are dizzy or you feel faint. °· You have trouble breathing or you are short of breath. °· Your speech is slurred. °· You have trouble moving an arm or leg on one side of your body. °· Your fingers or toes turn cold or blue. °This information is not intended to replace advice given to you by your health care provider. Make sure you discuss any questions you have with your health care provider. °Document Released: 07/09/2013 Document Revised: 04/21/2016 Document Reviewed: 03/24/2016 °Elsevier Interactive Patient Education © 2018 [employer] Inc[employer] ° °

## 2017-08-20 NOTE — Anesthesia Procedure Notes (Signed)
Procedure Name: General with mask airway Date/Time: 08/20/2017 8:55 AM Performed by: Quentin Ore, CRNA Pre-anesthesia Checklist: Patient identified, Emergency Drugs available, Suction available, Patient being monitored and Timeout performed Patient Re-evaluated:Patient Re-evaluated prior to induction Oxygen Delivery Method: Ambu bag Preoxygenation: Pre-oxygenation with 100% oxygen Induction Type: IV induction Ventilation: Mask ventilation without difficulty Dental Injury: Teeth and Oropharynx as per pre-operative assessment

## 2017-08-20 NOTE — Anesthesia Postprocedure Evaluation (Signed)
Anesthesia Post Note  Patient: Jeffery Christian  Procedure(s) Performed: CARDIOVERSION (N/A )     Patient location during evaluation: PACU Anesthesia Type: General Level of consciousness: awake and alert Pain management: pain level controlled Vital Signs Assessment: post-procedure vital signs reviewed and stable Respiratory status: spontaneous breathing, nonlabored ventilation and respiratory function stable Cardiovascular status: blood pressure returned to baseline and stable Postop Assessment: no apparent nausea or vomiting Anesthetic complications: no    Last Vitals:  Vitals:   08/20/17 0907 08/20/17 0915  BP: 114/71 118/82  Pulse:  (!) 54  Resp: 20 16  Temp: 36.8 C   SpO2: 100% 100%    Last Pain:  Vitals:   08/20/17 9357  TempSrc: Oral                 Beryle Lathe

## 2017-08-20 NOTE — Interval H&P Note (Signed)
History and Physical Interval Note:  08/20/2017 8:50 AM  Jeffery Christian  has presented today for surgery, with the diagnosis of AFIB  The various methods of treatment have been discussed with the patient and family. After consideration of risks, benefits and other options for treatment, the patient has consented to  Procedure(s): CARDIOVERSION (N/A) as a surgical intervention .  The patient's history has been reviewed, patient examined, no change in status, stable for surgery.  I have reviewed the patient's chart and labs.  Questions were answered to the patient's satisfaction.     [employer]

## 2017-08-20 NOTE — CV Procedure (Signed)
    Electrical Cardioversion Procedure Note Jeffery Christian [id number] 1951-05-08  Procedure: Electrical Cardioversion Indications:  Atrial Fibrillation  Time Out: Verified patient identification, verified procedure,medications/allergies/relevent history reviewed, required imaging and test results available.  Performed  Procedure Details  The patient was NPO after midnight. Anesthesia was administered at the beside  by Dr. Mal Amabile with propofol.  Cardioversion was performed with synchronized biphasic defibrillation via AP pads with 120 joules.  1 attempt(s) were performed.  The patient converted to normal sinus rhythm. The patient tolerated the procedure well   IMPRESSION:  Successful cardioversion of atrial fibrillation    Jeffery Christian 08/20/2017, 9:00 AM

## 2017-08-21 ENCOUNTER — Encounter (HOSPITAL_COMMUNITY): Payer: Self-pay | Admitting: Cardiology

## 2017-08-22 ENCOUNTER — Telehealth: Payer: Self-pay | Admitting: Family Medicine

## 2017-08-22 NOTE — Telephone Encounter (Signed)
Call placed to patient #[phone] to check on his status. No answer. Left patient a message to return my call.

## 2017-08-31 ENCOUNTER — Telehealth: Payer: Self-pay | Admitting: Family Medicine

## 2017-08-31 ENCOUNTER — Encounter (HOSPITAL_COMMUNITY): Payer: Self-pay | Admitting: *Deleted

## 2017-08-31 ENCOUNTER — Emergency Department (HOSPITAL_COMMUNITY)
Admission: EM | Admit: 2017-08-31 | Discharge: 2017-08-31 | Disposition: A | Discharge status: Home / Self Care | Payer: Medicare Other | Attending: Emergency Medicine | Admitting: Emergency Medicine

## 2017-08-31 DIAGNOSIS — N183 Chronic kidney disease, stage 3 (moderate): Secondary | ICD-10-CM | POA: Insufficient documentation

## 2017-08-31 DIAGNOSIS — R2241 Localized swelling, mass and lump, right lower limb: Secondary | ICD-10-CM | POA: Insufficient documentation

## 2017-08-31 DIAGNOSIS — M109 Gout, unspecified: Secondary | ICD-10-CM

## 2017-08-31 DIAGNOSIS — I5022 Chronic systolic (congestive) heart failure: Secondary | ICD-10-CM | POA: Insufficient documentation

## 2017-08-31 DIAGNOSIS — M79671 Pain in right foot: Secondary | ICD-10-CM

## 2017-08-31 DIAGNOSIS — Z79899 Other long term (current) drug therapy: Secondary | ICD-10-CM | POA: Insufficient documentation

## 2017-08-31 DIAGNOSIS — Z7901 Long term (current) use of anticoagulants: Secondary | ICD-10-CM | POA: Insufficient documentation

## 2017-08-31 DIAGNOSIS — M10071 Idiopathic gout, right ankle and foot: Secondary | ICD-10-CM | POA: Diagnosis not present

## 2017-08-31 MED ORDER — COLCHICINE 0.6 MG PO TABS: 0.6000 mg | ORAL_TABLET | Freq: Every day | ORAL | 0 refills | Status: DC

## 2017-08-31 MED ORDER — TRAMADOL HCL 50 MG PO TABS
50.0000 mg | ORAL_TABLET | Freq: Once | ORAL | Status: AC
  Administered 2017-08-31: 50 mg via ORAL
  Filled 2017-08-31: qty 1

## 2017-08-31 MED ORDER — PREDNISONE 20 MG PO TABS: 40.0000 mg | ORAL_TABLET | Freq: Every day | ORAL | 0 refills | Status: AC

## 2017-08-31 MED ORDER — COLCHICINE 0.6 MG PO TABS
1.2000 mg | ORAL_TABLET | Freq: Once | ORAL | Status: AC
  Administered 2017-08-31: 1.2 mg via ORAL
  Filled 2017-08-31: qty 2

## 2017-08-31 MED ORDER — PREDNISONE 20 MG PO TABS
40.0000 mg | ORAL_TABLET | Freq: Once | ORAL | Status: AC
  Administered 2017-08-31: 40 mg via ORAL
  Filled 2017-08-31: qty 2

## 2017-08-31 NOTE — Telephone Encounter (Signed)
Pt called to request a refill on  -colchicine 0.6 MG tablet  -predniSONE (DELTASONE) 20 MG tablet  -allopurinol *Pt will call Monday to schedule hospital follow up* Please follow up

## 2017-08-31 NOTE — ED Provider Notes (Signed)
[facility] EMERGENCY DEPARTMENT Provider Note   CSN: [id number] Arrival date & time: 08/31/17  5366     History   Chief Complaint Chief Complaint  Patient presents with  . Foot Pain    HPI  Jeffery Christian is a 66 y.o. Male with a history of gout, A. fib and chronic systolic heart failure, presents complaining of 2 days of pain, swelling and redness of the right foot. Patient reports this is identical to his previous flares of gout, which she's had since he was 66 years old. Reports pain started in the joint of the first toe and is radiating across the foot, patient also has pain and redness in the third toe. He reports pain is constant, and he has had difficulty walking on the foot due to pain. No pain in any other joints. He reports in the past he is taken allopurinol regularly, but has been out of this medication. Patient also reports he recently got started on multiple cardiac medications after being diagnosed with A. fib with RVR with a recent hospitalization. Patient reports in the past he's been given prednisone for his gout flares which is worked well for him. Patient denies any fevers or chills, and denies any  wounds on the bottom of the foot. Tried to get into see his PCP, but they could not give him an appointment today.      Past Medical History:  Diagnosis Date  . Chronic systolic CHF (congestive heart failure) (HCC)    EF 35-40, diffuse HK, mild MR, moderate LAE, mild RAE, PASP 44, L pleural eff  . Dilated cardiomyopathy (HCC)    likely related to tachycardia  . Persistent atrial fibrillation [facility])     Patient Active Problem List   Diagnosis Date Noted  . Atrial fibrillation with RVR (HCC) 08/05/2017  . CKD (chronic kidney disease), stage III (HCC) 08/05/2017  . Persistent atrial fibrillation (HCC)   . Acute on chronic systolic CHF (congestive heart failure) (HCC)   . Dilated cardiomyopathy (HCC)   . Shortness of breath   . History of gout  07/18/2017  . Left buttock abscess 03/22/2014    Past Surgical History:  Procedure Laterality Date  . CARDIOVERSION N/A 08/20/2017   Procedure: CARDIOVERSION;  Surgeon: [name], MD;  Location: [facility] ENDOSCOPY;  Service: Cardiovascular;  Laterality: N/A;  . INCISION AND DRAINAGE ABSCESS Left 03/22/2014   Procedure: INCISION AND DRAINAGE ABSCESS LEFT BUTTOCK ABSCESS;  Surgeon: [name], MD;  Location: [facility] OR;  Service: General;  Laterality: Left;       Home Medications    Prior to Admission medications   Medication Sig Start Date End Date Taking? Authorizing Provider  apixaban (ELIQUIS) 5 MG TABS tablet Take 1 tablet (5 mg total) by mouth 2 (two) times daily. 07/24/17   Philip Aspen, Limmie Patricia, MD  furosemide (LASIX) 80 MG tablet Take 1 tablet (80 mg total) by mouth daily. 07/25/17   Philip Aspen, Limmie Patricia, MD  metoprolol succinate (TOPROL-XL) 100 MG 24 hr tablet Take 2 tablets (200 mg total) by mouth daily. Take with or immediately following a meal. Patient taking differently: Take 100 mg by mouth 2 (two) times daily. Take with or immediately following a meal. 08/01/17 10/30/17  Beatrice Lecher, PA-C    Family History Family History  Problem Relation Age of Onset  . Leukemia Father        70  . Cancer Mother     Social History Social  History   Tobacco Use  . Smoking status: Never Smoker  . Smokeless tobacco: Never Used  Substance Use Topics  . Alcohol use: Yes    Comment: "pt states he "does not drink a lot anymore"  . Drug use: No     Allergies   Patient has no known allergies.   Review of Systems Review of Systems  Constitutional: Negative for chills and fever.  Eyes: Negative for discharge, redness and itching.  Respiratory: Negative for cough, chest tightness, shortness of breath and wheezing.   Cardiovascular: Negative for chest pain, palpitations and leg swelling.  Gastrointestinal: Negative for abdominal pain, diarrhea, nausea and  vomiting.  Musculoskeletal: Positive for arthralgias and joint swelling.       R foot  Skin: Positive for color change. Negative for rash and wound.  Neurological: Negative for dizziness, weakness and numbness.     Physical Exam Updated Vital Signs BP 133/88 (BP Location: Left Arm)   Pulse 78   Temp 98.6 F (37 C)   Resp 18   Ht 6' (1.829 m)   Wt 95.3 kg (210 lb)   SpO2 95%   BMI 28.48 kg/m   Physical Exam  Constitutional: He is oriented to person, place, and time. He appears well-developed and well-nourished. No distress.  HENT:  Head: Normocephalic and atraumatic.  Eyes: Right eye exhibits no discharge. Left eye exhibits no discharge.  Neck: Normal range of motion. Neck supple.  Cardiovascular: Normal rate, regular rhythm, normal heart sounds and intact distal pulses.  Pulmonary/Chest: Effort normal and breath sounds normal. No stridor. No respiratory distress. He has no wheezes. He has no rales.  Abdominal: Soft. Bowel sounds are normal. He exhibits no distension. There is no tenderness.  Musculoskeletal:  Swelling, redness and pain over forefoot, with redness concentrated over first MTP joint and 3rd toes, and swelling that does not extend past the foot (see picture below), pain with movement and palpation, but pt able to wiggle toes and plantar and dorsiflex foot, no calf swelling or tenderness, DP pulse 2+ and good cap refill, sensation intact   Neurological: He is alert and oriented to person, place, and time. Coordination normal.  Skin: Skin is warm and dry. Capillary refill takes less than 2 seconds. He is not diaphoretic.  Psychiatric: He has a normal mood and affect. His behavior is normal.  Nursing note and vitals reviewed.      ED Treatments / Results  Labs (all labs ordered are listed, but only abnormal results are displayed) Labs Reviewed - No data to display  EKG  EKG Interpretation None       Radiology No results found.  Procedures Procedures  (including critical care time)  Medications Ordered in ED Medications  predniSONE (DELTASONE) tablet 40 mg (40 mg Oral Given 08/31/17 1117)  traMADol (ULTRAM) tablet 50 mg (50 mg Oral Given 08/31/17 1117)  colchicine tablet 1.2 mg (1.2 mg Oral Given 08/31/17 1153)     Initial Impression / Assessment and Plan / ED Course  I have reviewed the triage vital signs and the nursing notes.  Pertinent labs & imaging results that were available during my care of the patient were reviewed by me and considered in my medical decision making (see chart for details).  Pt presents with monoarticular pain, swelling and erythema.  Pt is afebrile and stable. Pt reports this is typical of his previous gout flares. No wounds on the foot to suggest infection. Pt was recently started on lasix after being  diagnosed with Afib, this has likely contributed to flare, RRR on ausculation today, no CP, palpitations or SOB. Pt has stage II CKD so will treat with prednisone and colchicine and avoid NSAIDs. Pt given prednisone, colchicine and tramadol in ED. Will continue with 5 day course of steroids and 1 week of colchicine. Offered to discharge pt with tramadol for pain, but pt refused. Pt requesting additional prednisone for future flares, explained to pt that we cannot prescribe long courses fo medication from the ED and he will need to follow up with his PCP, pt to call today for appointment. Discussed that pt should respond to treatment with in 24 hour of begining treatment & likely resolve in 2-3 days. Return precautions discussed. Pt expresses understanding and is in agreement with plan.  Patient discussed with Dr. Dalene SeltzerSchlossman, who agrees with plan.    Final Clinical Impressions(s) / ED Diagnoses   Final diagnoses:  Acute gout of right foot, unspecified cause  Foot pain, right    ED Discharge Orders        Ordered    colchicine 0.6 MG tablet  Daily     08/31/17 1148    predniSONE (DELTASONE) 20 MG tablet   Daily     08/31/17 1148       Jodi GeraldsFord, Gulianna Hornsby [place]N, [place]PA-C 08/31/17 1454    Alvira MondaySchlossman, Erin, MD 09/02/17 0830

## 2017-08-31 NOTE — ED Triage Notes (Addendum)
Pt reports pain and swelling to right foot, pt insists that this is gout pain and he is out of his meds. Requesting prednisone.

## 2017-08-31 NOTE — Discharge Instructions (Signed)
Please take first dose of colchicine one hour after leaving the emergency department, then continue taking once daily. Take 5 days of prednisone as directed. Please call today to schedule sooner follow up appointment with your PCP to monitor for improvement of gout, they may need to make changes to your other medications as well. If you have worsening pain, redness or swelling, fevers or chills, or other new or concerning symptoms develop please return to the ED for reevaluation.

## 2017-09-03 MED FILL — ?FUROSEMIDE 80MG TABLET: 80 | 30 days supply | Qty: 30 | Fill #1 | Status: TO

## 2017-09-03 MED FILL — METOPROLOL SUCC ER 50 MG TA: 50 | 30 days supply | Qty: 90 | Fill #1 | Status: TO

## 2017-09-03 MED FILL — !ELIQUIS 5MG TABLET: 5 | 30 days supply | Qty: 60 | Fill #1 | Status: TO

## 2017-09-03 NOTE — Telephone Encounter (Addendum)
He was seen at the ED and received refills.  I will see him at his follow-up visit.

## 2017-09-04 ENCOUNTER — Ambulatory Visit: Payer: Self-pay | Admitting: Physician Assistant

## 2017-09-05 ENCOUNTER — Encounter: Payer: Self-pay | Admitting: Family Medicine

## 2017-09-05 ENCOUNTER — Ambulatory Visit: Payer: Medicare Other | Attending: Family Medicine | Admitting: Family Medicine

## 2017-09-05 VITALS — BP 137/85 | HR 49 | Temp 97.6°F | Ht 72.0 in | Wt 211.0 lb

## 2017-09-05 DIAGNOSIS — I481 Persistent atrial fibrillation: Secondary | ICD-10-CM | POA: Diagnosis not present

## 2017-09-05 DIAGNOSIS — M1A0720 Idiopathic chronic gout, left ankle and foot, without tophus (tophi): Secondary | ICD-10-CM

## 2017-09-05 DIAGNOSIS — Z7902 Long term (current) use of antithrombotics/antiplatelets: Secondary | ICD-10-CM | POA: Insufficient documentation

## 2017-09-05 DIAGNOSIS — Z9119 Patient's noncompliance with other medical treatment and regimen: Secondary | ICD-10-CM | POA: Diagnosis not present

## 2017-09-05 DIAGNOSIS — I5022 Chronic systolic (congestive) heart failure: Secondary | ICD-10-CM | POA: Diagnosis not present

## 2017-09-05 DIAGNOSIS — I420 Dilated cardiomyopathy: Secondary | ICD-10-CM | POA: Insufficient documentation

## 2017-09-05 DIAGNOSIS — Z9889 Other specified postprocedural states: Secondary | ICD-10-CM | POA: Diagnosis not present

## 2017-09-05 DIAGNOSIS — M109 Gout, unspecified: Secondary | ICD-10-CM | POA: Diagnosis present

## 2017-09-05 DIAGNOSIS — R350 Frequency of micturition: Secondary | ICD-10-CM | POA: Diagnosis not present

## 2017-09-05 DIAGNOSIS — I4819 Other persistent atrial fibrillation: Secondary | ICD-10-CM

## 2017-09-05 DIAGNOSIS — N183 Chronic kidney disease, stage 3 unspecified: Secondary | ICD-10-CM

## 2017-09-05 DIAGNOSIS — Z79899 Other long term (current) drug therapy: Secondary | ICD-10-CM | POA: Insufficient documentation

## 2017-09-05 MED ORDER — ALLOPURINOL 100 MG PO TABS: 100.0000 mg | ORAL_TABLET | Freq: Two times a day (BID) | ORAL | 3 refills | Status: DC

## 2017-09-05 MED ORDER — APIXABAN 5 MG PO TABS: 5.0000 mg | ORAL_TABLET | Freq: Two times a day (BID) | ORAL | 2 refills | Status: DC

## 2017-09-05 MED ORDER — FUROSEMIDE 40 MG PO TABS: 40.0000 mg | ORAL_TABLET | Freq: Every day | ORAL | 3 refills | Status: DC

## 2017-09-05 MED ORDER — APIXABAN 2.5 MG PO TABS: 2.5000 mg | ORAL_TABLET | Freq: Two times a day (BID) | ORAL | 3 refills | Status: DC

## 2017-09-05 MED FILL — ?ALLOPURINOL 100MG TABLET: 100 | 30 days supply | Qty: 60 | Fill #0

## 2017-09-05 NOTE — Progress Notes (Signed)
Subjective:  Patient ID: Jeffery Christian, male    DOB: 1950/12/06  Age: 66 y.o. MRN: [id number]  CC: Gout   HPI Jeffery Christian is a 66 year old male with a history of acute on chronic CHF (EF 35-40% from echo of 07/2017), Atrial Fibrillation, stage III chronic kidney disease noncompliance here for an ED follow-up where he was managed for right foot gout flare.  He had presented with right foot swelling, redness and pain for which he was prescribed prednisone, tramadol and colchicine.  He has been compliant with his prednisone and has just 2 tablets left but states the colchicine was not effective. In the past he was on allopurinol he states but currently does not take it as he has been not had it in a while  He endorses compliance with a low purine eating plan.  Today he informs me the flare has resolved and he is requesting prescription for prednisone so he can have it on hand in the event of a flare. He has been compliant with his medications, complains of urinary frequency with his Lasix and would like to decrease the dose. On 08/20/17 he underwent successful cardioversion of his atrial fibrillation.  Past Medical History:  Diagnosis Date  . Chronic systolic CHF (congestive heart failure) (HCC)    EF 35-40, diffuse HK, mild MR, moderate LAE, mild RAE, PASP 44, L pleural eff  . Dilated cardiomyopathy ([place])    likely related to tachycardia  . Persistent atrial fibrillation [facility])     Past Surgical History:  Procedure Laterality Date  . CARDIOVERSION N/A 08/20/2017   Procedure: CARDIOVERSION;  Surgeon: Jerline Pain, MD;  Location: [place];  Service: Cardiovascular;  Laterality: N/A;  . INCISION AND DRAINAGE ABSCESS Left 03/22/2014   Procedure: INCISION AND DRAINAGE ABSCESS LEFT BUTTOCK ABSCESS;  Surgeon: Zenovia Jarred, MD;  Location: [place];  Service: General;  Laterality: Left;    No Known Allergies   Outpatient Medications Prior to Visit  Medication Sig Dispense Refill  .  colchicine 0.6 MG tablet Take 1 tablet (0.6 mg total) by mouth daily. 8 tablet 0  . metoprolol succinate (TOPROL-XL) 100 MG 24 hr tablet Take 2 tablets (200 mg total) by mouth daily. Take with or immediately following a meal. (Patient taking differently: Take 100 mg by mouth 2 (two) times daily. Take with or immediately following a meal.) 180 tablet 3  . apixaban (ELIQUIS) 5 MG TABS tablet Take 1 tablet (5 mg total) by mouth 2 (two) times daily. 60 tablet 2  . furosemide (LASIX) 80 MG tablet Take 1 tablet (80 mg total) by mouth daily. 30 tablet 2  . predniSONE (DELTASONE) 20 MG tablet Take 2 tablets (40 mg total) by mouth daily for 5 days. (Patient not taking: Reported on 09/05/2017) 10 tablet 0   No facility-administered medications prior to visit.     ROS Review of Systems  Constitutional: Negative for activity change and appetite change.  HENT: Negative for sinus pressure and sore throat.   Eyes: Negative for visual disturbance.  Respiratory: Negative for cough, chest tightness and shortness of breath.   Cardiovascular: Negative for chest pain and leg swelling.  Gastrointestinal: Negative for abdominal distention, abdominal pain, constipation and diarrhea.  Endocrine: Negative.   Genitourinary: Negative for dysuria.  Musculoskeletal: Negative for joint swelling and myalgias.  Skin: Negative for rash.  Allergic/Immunologic: Negative.   Neurological: Negative for weakness, light-headedness and numbness.  Psychiatric/Behavioral: Negative for dysphoric mood and suicidal ideas.    Objective:  BP 137/85   Pulse (!) 49   Temp 97.6 F (36.4 C) (Oral)   Ht 6' (1.829 m)   Wt 211 lb (95.7 kg)   SpO2 99%   BMI 28.62 kg/m   BP/Weight 09/05/2017 08/31/2017 17/40/8144  Systolic BP 818 563 149  Diastolic BP 85 93 83  Wt. (Lbs) 211 210 212  BMI 28.62 28.48 28.75      Physical Exam  Constitutional: He is oriented to person, place, and time. He appears well-developed and well-nourished.    Cardiovascular: Normal rate, normal heart sounds and intact distal pulses.  No murmur heard. Pulmonary/Chest: Effort normal and breath sounds normal. He has no wheezes. He has no rales. He exhibits no tenderness.  Abdominal: Soft. Bowel sounds are normal. He exhibits no distension and no mass. There is no tenderness.  Musculoskeletal: Normal range of motion. He exhibits no edema or tenderness.  Normal appearance of right ankle, no TTP  Neurological: He is alert and oriented to person, place, and time.  Skin: Skin is warm and dry.  Psychiatric: He has a normal mood and affect.     Assessment & Plan:   1. Persistent atrial fibrillation (HCC) Status post cardioversion on 08/20/17 Rate control with metoprolol Reduced Eliquis to renal dose - apixaban (ELIQUIS) 2.5 MG TABS tablet; Take 1 tablet (2.5 mg total) by mouth 2 (two) times daily.  Dispense: 60 tablet; Refill: 3  2. CKD (chronic kidney disease), stage III ([place]) Secondary to hypertensive nephropathy We have discussed avoidance of nephrotoxins I would like to refer him to nephrology however he declines We will check renal function again and if still decreased will refer. Currently he is on Lasix which is also contributory. - CMP14+EGFR  3. Idiopathic chronic gout of left ankle without tophus Acute episode resolved Placed on allopurinol renal dose for prophylaxis I have informed him he does not need to keep prednisone on hand as it could exacerbate his heart failure however he should call the clinic in the event of an acute flare Discussed low eating purine plans - allopurinol (ZYLOPRIM) 100 MG tablet; Take 1 tablet (100 mg total) by mouth 2 (two) times daily.  Dispense: 60 tablet; Refill: 3  4. Chronic systolic CHF (congestive heart failure) (HCC) 35-40% from echo of 07/2017 Euvolemic Decreased Lasix dose from 80 mg to 40 mg and patient has been instructed that in the event of weight gain greater than 5 pounds in a week or  shortness of breath he would need to revert to his 80 mg dose - furosemide (LASIX) 40 MG tablet; Take 1 tablet (40 mg total) by mouth daily.  Dispense: 30 tablet; Refill: 3   Meds ordered this encounter  Medications  . allopurinol (ZYLOPRIM) 100 MG tablet    Sig: Take 1 tablet (100 mg total) by mouth 2 (two) times daily.    Dispense:  60 tablet    Refill:  3  . DISCONTD: furosemide (LASIX) 40 MG tablet    Sig: Take 1 tablet (40 mg total) by mouth daily.    Dispense:  30 tablet    Refill:  3  . DISCONTD: apixaban (ELIQUIS) 5 MG TABS tablet    Sig: Take 1 tablet (5 mg total) by mouth 2 (two) times daily.    Dispense:  60 tablet    Refill:  2  . apixaban (ELIQUIS) 2.5 MG TABS tablet    Sig: Take 1 tablet (2.5 mg total) by mouth 2 (two) times daily.  Dispense:  60 tablet    Refill:  3    Renal dosing; discontinue previous dose  . furosemide (LASIX) 40 MG tablet    Sig: Take 1 tablet (40 mg total) by mouth daily.    Dispense:  30 tablet    Refill:  3    Follow-up: Return in about 3 months (around 12/04/2017) for follow up on chf, afib and gout.    Amao MD   

## 2017-09-05 NOTE — Patient Instructions (Signed)

## 2017-09-06 ENCOUNTER — Telehealth: Payer: Self-pay

## 2017-09-06 ENCOUNTER — Ambulatory Visit: Payer: Self-pay | Attending: Family Medicine

## 2017-09-06 LAB — CMP14+EGFR
ALT: 46 IU/L — AB (ref 0–44)
AST: 28 IU/L (ref 0–40)
Albumin/Globulin Ratio: 1.4 (ref 1.2–2.2)
Albumin: 4.1 g/dL (ref 3.6–4.8)
Alkaline Phosphatase: 86 IU/L (ref 39–117)
BUN/Creatinine Ratio: 16 (ref 10–24)
BUN: 23 mg/dL (ref 8–27)
Bilirubin Total: 0.5 mg/dL (ref 0.0–1.2)
CALCIUM: 9.9 mg/dL (ref 8.6–10.2)
CO2: 28 mmol/L (ref 20–29)
CREATININE: 1.48 mg/dL — AB (ref 0.76–1.27)
Chloride: 102 mmol/L (ref 96–106)
GFR calc Af Amer: 57 mL/min/{1.73_m2} — ABNORMAL LOW (ref >59)
GFR, EST NON AFRICAN AMERICAN: 49 mL/min/{1.73_m2} — AB (ref >59)
GLUCOSE: 82 mg/dL (ref 65–99)
Globulin, Total: 2.9 g/dL (ref 1.5–4.5)
POTASSIUM: 4 mmol/L (ref 3.5–5.2)
Sodium: 146 mmol/L — ABNORMAL HIGH (ref 134–144)
Total Protein: 7 g/dL (ref 6.0–8.5)

## 2017-09-06 NOTE — Telephone Encounter (Signed)
Pt was called and informed of lab results. 

## 2017-09-13 NOTE — Addendum Note (Signed)
Addended by: Jaclyn Shaggy on: 09/13/2017 08:36 AM   Modules accepted: Level of Service

## 2017-09-14 ENCOUNTER — Telehealth: Payer: Self-pay | Admitting: Family Medicine

## 2017-09-14 NOTE — Telephone Encounter (Signed)
Call placed to patient #[id number] to check on his status. No answer. Left patient a message asking him to return my call at [phone].

## 2017-09-21 ENCOUNTER — Ambulatory Visit: Payer: Medicare Other | Admitting: Family Medicine

## 2017-10-01 ENCOUNTER — Telehealth: Payer: Self-pay | Admitting: Family Medicine

## 2017-10-01 ENCOUNTER — Other Ambulatory Visit: Payer: Self-pay | Admitting: Family Medicine

## 2017-10-01 MED ORDER — PREDNISONE 20 MG PO TABS: 20.0000 mg | ORAL_TABLET | Freq: Two times a day (BID) | ORAL | 0 refills | Status: DC

## 2017-10-01 NOTE — Progress Notes (Signed)
Patient called complaining of a gout flare

## 2017-10-01 NOTE — Telephone Encounter (Signed)
Pt called to request some predniSONE (DELTASONE) tablet 40 mg    To be sent to the [facility] pharmacy,  Pt was inform that the provider will sent an prescription to the pharmacy

## 2017-10-12 ENCOUNTER — Encounter: Payer: Self-pay | Admitting: Family Medicine

## 2017-10-12 ENCOUNTER — Ambulatory Visit: Payer: Medicare Other | Attending: Family Medicine | Admitting: Family Medicine

## 2017-10-12 DIAGNOSIS — M1A0720 Idiopathic chronic gout, left ankle and foot, without tophus (tophi): Secondary | ICD-10-CM | POA: Diagnosis not present

## 2017-10-12 DIAGNOSIS — I5022 Chronic systolic (congestive) heart failure: Secondary | ICD-10-CM | POA: Diagnosis not present

## 2017-10-12 DIAGNOSIS — I420 Dilated cardiomyopathy: Secondary | ICD-10-CM | POA: Diagnosis not present

## 2017-10-12 DIAGNOSIS — M1A0721 Idiopathic chronic gout, left ankle and foot, with tophus (tophi): Secondary | ICD-10-CM | POA: Diagnosis not present

## 2017-10-12 DIAGNOSIS — M109 Gout, unspecified: Secondary | ICD-10-CM | POA: Diagnosis present

## 2017-10-12 DIAGNOSIS — Z7901 Long term (current) use of anticoagulants: Secondary | ICD-10-CM | POA: Insufficient documentation

## 2017-10-12 DIAGNOSIS — N183 Chronic kidney disease, stage 3 (moderate): Secondary | ICD-10-CM | POA: Diagnosis not present

## 2017-10-12 DIAGNOSIS — I481 Persistent atrial fibrillation: Secondary | ICD-10-CM | POA: Insufficient documentation

## 2017-10-12 DIAGNOSIS — Z79899 Other long term (current) drug therapy: Secondary | ICD-10-CM | POA: Diagnosis not present

## 2017-10-12 MED ORDER — PREDNISONE 20 MG PO TABS
20.0000 mg | ORAL_TABLET | Freq: Two times a day (BID) | ORAL | 0 refills | Status: DC
Start: 2017-10-12 — End: 2018-03-31

## 2017-10-12 MED ORDER — COLCHICINE 0.6 MG PO TABS: ORAL_TABLET | ORAL | 3 refills | Status: DC

## 2017-10-12 MED ORDER — ALLOPURINOL 300 MG PO TABS: 300.0000 mg | ORAL_TABLET | Freq: Every day | ORAL | 3 refills | Status: DC

## 2017-10-12 MED FILL — !COLCRYS 0.6 MG TABLET: 0.6 MG | 10 days supply | Qty: 30 | Fill #0

## 2017-10-12 MED FILL — predniSONE 20 MG TABS: 20 | 5 days supply | Qty: 10 | Fill #0

## 2017-10-12 MED FILL — ALLOPURINOL 300 MG TABLET: 300 | 30 days supply | Qty: 30 | Fill #0

## 2017-10-12 NOTE — Patient Instructions (Signed)
[phone] After hours on call provider.

## 2017-10-12 NOTE — Progress Notes (Signed)
Subjective:  Patient ID: Jeffery Christian, male    DOB: Feb 06, 1951  Age: 67 y.o. MRN: [id number]  CC: Gout   HPI [name] is a 67 year old male with a history of acute on chronic CHF (EF 35-40% from echo of 07/2017), Atrial Fibrillation, stage III chronic kidney disease here with complaints of frequent gout flares.  He had called the clinic requesting a prednisone prescription which had been called into his pharmacy 11 days ago for an acute gout flare. He informs me he had flares in both feet with significant tenderness in the big toes bilaterally which prevented him from getting out of bed. States he has had gout since he was in his 25s and was previously on 300 mg of allopurinol but now is on 100 mg twice daily which "does nothing". His dose of allopurinol been decided by his renal function but most recent labs revealed improvement in creatinine from 1.83 down to 1.48. He completed a course of prednisone and informs me he had called a friend of his to bring him some more prednisone which he took in addition (received 30 from his friend) and is requesting a prescription for prednisone be written so he has it on hand in the event that he has gout flares. Denies ingestion of red meats, wine or fluids that trigger gout.   Pain has improved and is rated at a 2/10 in his left big toe Declines flu shot.  Past Medical History:  Diagnosis Date  . Chronic systolic CHF (congestive heart failure) (HCC)    EF 35-40, diffuse HK, mild MR, moderate LAE, mild RAE, PASP 44, L pleural eff  . Dilated cardiomyopathy (HCC)    likely related to tachycardia  . Persistent atrial fibrillation [facility])     Past Surgical History:  Procedure Laterality Date  . CARDIOVERSION N/A 08/20/2017   Procedure: CARDIOVERSION;  Surgeon: [name], MD;  Location[facility] ENDOSCOPY;  Service: Cardiovascular;  Laterality: N/A;  . INCISION AND DRAINAGE ABSCESS Left 03/22/2014   Procedure: INCISION AND DRAINAGE ABSCESS LEFT BUTTOCK  ABSCESS;  Surgeon: [name], MD;  Location: [facility] OR;  Service: General;  Laterality: Left;    No Known Allergies   Outpatient Medications Prior to Visit  Medication Sig Dispense Refill  . apixaban (ELIQUIS) 2.5 MG TABS tablet Take 1 tablet (2.5 mg total) by mouth 2 (two) times daily. 60 tablet 3  . furosemide (LASIX) 40 MG tablet Take 1 tablet (40 mg total) by mouth daily. 30 tablet 3  . metoprolol succinate (TOPROL-XL) 100 MG 24 hr tablet Take 2 tablets (200 mg total) by mouth daily. Take with or immediately following a meal. (Patient taking differently: Take 100 mg by mouth 2 (two) times daily. Take with or immediately following a meal.) 180 tablet 3  . allopurinol (ZYLOPRIM) 100 MG tablet Take 1 tablet (100 mg total) by mouth 2 (two) times daily. 60 tablet 3  . colchicine 0.6 MG tablet Take 1 tablet (0.6 mg total) by mouth daily. 8 tablet 0  . predniSONE (DELTASONE) 20 MG tablet Take 1 tablet (20 mg total) by mouth 2 (two) times daily with a meal. 10 tablet 0   No facility-administered medications prior to visit.     ROS Review of Systems  Constitutional: Negative for activity change and appetite change.  HENT: Negative for sinus pressure and sore throat.   Respiratory: Negative for chest tightness, shortness of breath and wheezing.   Cardiovascular: Negative for chest pain and palpitations.  Gastrointestinal:  Negative for abdominal distention, abdominal pain and constipation.  Genitourinary: Negative.   Musculoskeletal:       See hpi  Psychiatric/Behavioral: Negative for behavioral problems and dysphoric mood.    Objective:  BP 132/87   Pulse 62   Temp (!) 97.4 F (36.3 C) (Oral)   Ht 6' (1.829 m)   Wt 214 lb (97.1 kg)   SpO2 99%   BMI 29.02 kg/m   BP/Weight 10/12/2017 09/05/2017 08/31/2017  Systolic BP 132 137 135  Diastolic BP 87 85 93  Wt. (Lbs) 214 211 210  BMI 29.02 28.62 28.48      Physical Exam  Constitutional: He is oriented to person, place, and  time. He appears well-developed and well-nourished.  Cardiovascular: Normal rate, normal heart sounds and intact distal pulses.  No murmur heard. Pulmonary/Chest: Effort normal and breath sounds normal. He has no wheezes. He has no rales. He exhibits no tenderness.  Abdominal: Soft. Bowel sounds are normal. He exhibits no distension and no mass. There is no tenderness.  Musculoskeletal: Normal range of motion. He exhibits no edema or tenderness.  Slight erythema of the left bunion and medial aspect of left great toe  Neurological: He is alert and oriented to person, place, and time.     Assessment & Plan:   1. Idiopathic chronic gout of left ankle without tophus Acute flare currently resolving I have explained to him he cannot remain on chronic prednisone given the adverse effects related to prolonged steroid use including worsening of his congestive heart failure, increased predisposition to diabetes mellitus. We will however give him a short course of prednisone to complete resolution of flare and he has been informed of the after hours on-call line and in the event of a flare he can call the clinic. I have increased his allopurinol dose and will increase further as tolerated based on renal function if symptoms persist He is unhappy with this decision as he would like to have a lot of prednisone on hand and informs me another doctor will give it to him. - allopurinol (ZYLOPRIM) 300 MG tablet; Take 1 tablet (300 mg total) by mouth daily.  Dispense: 30 tablet; Refill: 3 - predniSONE (DELTASONE) 20 MG tablet; Take 1 tablet (20 mg total) by mouth 2 (two) times daily with a meal.  Dispense: 10 tablet; Refill: 0 - colchicine 0.6 MG tablet; Take 2 tabs (1.2 mg) at the onset of a gout flare and repeat 1 tab (0.6 mg) in 1 hour if symptoms persist  Dispense: 30 tablet; Refill: 3   Meds ordered this encounter  Medications  . allopurinol (ZYLOPRIM) 300 MG tablet    Sig: Take 1 tablet (300 mg total)  by mouth daily.    Dispense:  30 tablet    Refill:  3  . predniSONE (DELTASONE) 20 MG tablet    Sig: Take 1 tablet (20 mg total) by mouth 2 (two) times daily with a meal.    Dispense:  10 tablet    Refill:  0  . colchicine 0.6 MG tablet    Sig: Take 2 tabs (1.2 mg) at the onset of a gout flare and repeat 1 tab (0.6 mg) in 1 hour if symptoms persist    Dispense:  30 tablet    Refill:  3    Follow-up: Return in about 3 months (around 01/10/2018) for Follow-up on chronic medical conditions.   Jaclyn Shaggy MD

## 2017-10-18 MED FILL — $ELIQUIS 2.5 MG TABLET: 2.5 | 30 days supply | Qty: 60 | Fill #0

## 2017-12-05 ENCOUNTER — Ambulatory Visit: Payer: Medicare Other | Admitting: Family Medicine

## 2017-12-20 MED FILL — ALLOPURINOL 300 MG TABS: 300 | 30 days supply | Qty: 30 | Fill #1

## 2018-01-07 ENCOUNTER — Ambulatory Visit: Payer: Medicare Other | Attending: Family Medicine | Admitting: Family Medicine

## 2018-01-07 VITALS — BP 141/93 | HR 62 | Temp 97.5°F | Wt 239.0 lb

## 2018-01-07 DIAGNOSIS — Z09 Encounter for follow-up examination after completed treatment for conditions other than malignant neoplasm: Secondary | ICD-10-CM | POA: Insufficient documentation

## 2018-01-07 DIAGNOSIS — Z7901 Long term (current) use of anticoagulants: Secondary | ICD-10-CM | POA: Insufficient documentation

## 2018-01-07 DIAGNOSIS — M1A0720 Idiopathic chronic gout, left ankle and foot, without tophus (tophi): Secondary | ICD-10-CM

## 2018-01-07 DIAGNOSIS — I481 Persistent atrial fibrillation: Secondary | ICD-10-CM | POA: Insufficient documentation

## 2018-01-07 DIAGNOSIS — Z7952 Long term (current) use of systemic steroids: Secondary | ICD-10-CM | POA: Insufficient documentation

## 2018-01-07 DIAGNOSIS — Z9889 Other specified postprocedural states: Secondary | ICD-10-CM | POA: Insufficient documentation

## 2018-01-07 DIAGNOSIS — I4891 Unspecified atrial fibrillation: Secondary | ICD-10-CM | POA: Diagnosis not present

## 2018-01-07 DIAGNOSIS — I420 Dilated cardiomyopathy: Secondary | ICD-10-CM | POA: Diagnosis not present

## 2018-01-07 DIAGNOSIS — Z9119 Patient's noncompliance with other medical treatment and regimen: Secondary | ICD-10-CM | POA: Diagnosis not present

## 2018-01-07 DIAGNOSIS — I5022 Chronic systolic (congestive) heart failure: Secondary | ICD-10-CM | POA: Diagnosis not present

## 2018-01-07 DIAGNOSIS — I10 Essential (primary) hypertension: Secondary | ICD-10-CM | POA: Diagnosis not present

## 2018-01-07 DIAGNOSIS — N183 Chronic kidney disease, stage 3 unspecified: Secondary | ICD-10-CM

## 2018-01-07 DIAGNOSIS — Z79899 Other long term (current) drug therapy: Secondary | ICD-10-CM | POA: Diagnosis not present

## 2018-01-07 DIAGNOSIS — I130 Hypertensive heart and chronic kidney disease with heart failure and stage 1 through stage 4 chronic kidney disease, or unspecified chronic kidney disease: Secondary | ICD-10-CM | POA: Diagnosis not present

## 2018-01-07 MED ORDER — METOPROLOL SUCCINATE ER 100 MG PO TB24: 200.0000 mg | ORAL_TABLET | Freq: Every day | ORAL | 6 refills | Status: DC

## 2018-01-07 MED ORDER — COLCHICINE 0.6 MG PO TABS: ORAL_TABLET | ORAL | 6 refills | Status: DC

## 2018-01-07 MED ORDER — FUROSEMIDE 40 MG PO TABS
40.0000 mg | ORAL_TABLET | Freq: Every day | ORAL | 6 refills | Status: DC
Start: 2018-01-07 — End: 2018-04-09

## 2018-01-07 MED ORDER — ALLOPURINOL 300 MG PO TABS: 300.0000 mg | ORAL_TABLET | Freq: Every day | ORAL | 6 refills | Status: DC

## 2018-01-07 MED FILL — METOPROLOL SUCCINATE ER 100: 100 | 30 days supply | Qty: 60 | Fill #0

## 2018-01-07 MED FILL — COLCHICINE 0.6 MG TABS: 0.6 | 5 days supply | Qty: 10 | Fill #0

## 2018-01-07 MED FILL — FUROSEMIDE 40 MG TAB: 40 | 30 days supply | Qty: 30 | Fill #0

## 2018-01-07 NOTE — Progress Notes (Addendum)
Subjective:  Patient ID: Jeffery Christian, male    DOB: [birth year]  Age: 67 y.o. MRN: [id number]  RD:EYCX  HPI Abelino Tippin is a 67 year old male with a history of acute on chronic CHF (EF 35-40% from echo of 07/2017), Atrial Fibrillation s/p DCCV, stage III chronic kidney disease,noncompliance here for a follow-up visit. He is accompanied by his friend and reports doing well with no recent gout flares; last flare was in 10/2017. With respect to his congestive heart failure he denies shortness of breath and skipped taking his Lasix this morning as he was going out.  He informs me he takes half of his Lasix pill because he does not like the excessive urination it causes. Denies chest pain and has not seen cardiologist since his procedure. He tolerates his antihypertensive and denies adverse effect. Not currently followed by a nephrologist for management of his chronic kidney disease.  Past Medical History:  Diagnosis Date  . Chronic systolic CHF (congestive heart failure) (HCC)    EF 35-40, diffuse HK, mild MR, moderate LAE, mild RAE, PASP 44, L pleural eff  . Dilated cardiomyopathy ([place])    likely related to tachycardia  . Persistent atrial fibrillation [facility])     Past Surgical History:  Procedure Laterality Date  . CARDIOVERSION N/A 08/20/2017   Procedure: CARDIOVERSION;  Surgeon: Jerline Pain, MD;  Location: [place];  Service: Cardiovascular;  Laterality: N/A;  . INCISION AND DRAINAGE ABSCESS Left 03/22/2014   Procedure: INCISION AND DRAINAGE ABSCESS LEFT BUTTOCK ABSCESS;  Surgeon: Zenovia Jarred, MD;  Location: [place];  Service: General;  Laterality: Left;    No Known Allergies   Outpatient Medications Prior to Visit  Medication Sig Dispense Refill  . apixaban (ELIQUIS) 2.5 MG TABS tablet Take 1 tablet (2.5 mg total) by mouth 2 (two) times daily. 60 tablet 3  . predniSONE (DELTASONE) 20 MG tablet Take 1 tablet (20 mg total) by mouth 2 (two) times daily with a meal. 10 tablet 0    . allopurinol (ZYLOPRIM) 300 MG tablet Take 1 tablet (300 mg total) by mouth daily. 30 tablet 3  . colchicine 0.6 MG tablet Take 2 tabs (1.2 mg) at the onset of a gout flare and repeat 1 tab (0.6 mg) in 1 hour if symptoms persist 30 tablet 3  . furosemide (LASIX) 40 MG tablet Take 1 tablet (40 mg total) by mouth daily. 30 tablet 3  . metoprolol succinate (TOPROL-XL) 100 MG 24 hr tablet Take 2 tablets (200 mg total) by mouth daily. Take with or immediately following a meal. (Patient taking differently: Take 100 mg by mouth 2 (two) times daily. Take with or immediately following a meal.) 180 tablet 3   No facility-administered medications prior to visit.     ROS Review of Systems  Constitutional: Negative for activity change and appetite change.  HENT: Negative for sinus pressure and sore throat.   Eyes: Negative for visual disturbance.  Respiratory: Negative for cough, chest tightness and shortness of breath.   Cardiovascular: Negative for chest pain and leg swelling.  Gastrointestinal: Negative for abdominal distention, abdominal pain, constipation and diarrhea.  Endocrine: Negative.   Genitourinary: Negative for dysuria.  Musculoskeletal: Negative for joint swelling and myalgias.  Skin: Negative for rash.  Allergic/Immunologic: Negative.   Neurological: Negative for weakness, light-headedness and numbness.  Psychiatric/Behavioral: Negative for dysphoric mood and suicidal ideas.    Objective:  BP (!) 141/93   Pulse 62   Temp (!) 97.5 F (36.4 C) (Oral)  Wt 239 lb (108.4 kg)   SpO2 96%   BMI 32.41 kg/m   BP/Weight 01/07/2018 10/12/2017 16/03/629  Systolic BP 160 109 323  Diastolic BP 93 87 85  Wt. (Lbs) 239 214 211  BMI 32.41 29.02 28.62    Wt Readings from Last 3 Encounters:  01/07/18 239 lb (108.4 kg)  10/12/17 214 lb (97.1 kg)  09/05/17 211 lb (95.7 kg)     Physical Exam  Constitutional: He is oriented to person, place, and time. He appears well-developed and  well-nourished.  Neck: No JVD present.  Cardiovascular: Normal rate, normal heart sounds and intact distal pulses.  No murmur heard. Pulmonary/Chest: Effort normal and breath sounds normal. He has no wheezes. He has no rales. He exhibits no tenderness.  Abdominal: Soft. Bowel sounds are normal. He exhibits no distension and no mass. There is no tenderness.  Musculoskeletal: Normal range of motion. He exhibits edema (1+ left ankle edema).  Neurological: He is alert and oriented to person, place, and time.  Skin: Skin is warm and dry.  Psychiatric: He has a normal mood and affect.     CMP Latest Ref Rng & Units 09/05/2017 08/20/2017 08/07/2017  Glucose 65 - 99 mg/dL 82 111(H) 110(H)  BUN 8 - 27 mg/dL 23 - 34(H)  Creatinine 0.76 - 1.27 mg/dL 1.48(H) - 1.83(H)  Sodium 134 - 144 mmol/L 146(H) 143 139  Potassium 3.5 - 5.2 mmol/L 4.0 4.1 3.9  Chloride 96 - 106 mmol/L 102 - 101  CO2 20 - 29 mmol/L 28 - 30  Calcium 8.6 - 10.2 mg/dL 9.9 - 9.1  Total Protein 6.0 - 8.5 g/dL 7.0 - -  Total Bilirubin 0.0 - 1.2 mg/dL 0.5 - -  Alkaline Phos 39 - 117 IU/L 86 - -  AST 0 - 40 IU/L 28 - -  ALT 0 - 44 IU/L 46(H) - -     Assessment & Plan:   1. Idiopathic chronic gout of left ankle without tophus No recent flares - allopurinol (ZYLOPRIM) 300 MG tablet; Take 1 tablet (300 mg total) by mouth daily.  Dispense: 30 tablet; Refill: 6 - colchicine 0.6 MG tablet; Take 2 tabs (1.2 mg) at the onset of a gout flare and repeat 1 tab (0.6 mg) in 1 hour if symptoms persist  Dispense: 30 tablet; Refill: 6  2. Chronic systolic CHF (congestive heart failure) (HCC) 35-40% from echo of 07/2017 Gained 25 pounds in the last 3 months Not totally adherent with diuretics due to excessive urination; declines increase in Lasix dose due to excessive urination Low-sodium, heart healthy diet - furosemide (LASIX) 40 MG tablet; Take 1 tablet (40 mg total) by mouth daily.  Dispense: 30 tablet; Refill: 6 - Ambulatory referral to  Cardiology  3. Atrial fibrillation with RVR (HCC) Status post DC CV Continue anticoagulation with Eliquis (renal dosing) - Ambulatory referral to Cardiology  4. CKD (chronic kidney disease), stage III (HCC) Hypertensive nephropathy Avoid nephrotoxins - CMP14+EGFR  5. Essential hypertension Slightly elevated No regimen change Low sodium, DASH diet   Meds ordered this encounter  Medications  . allopurinol (ZYLOPRIM) 300 MG tablet    Sig: Take 1 tablet (300 mg total) by mouth daily.    Dispense:  30 tablet    Refill:  6  . colchicine 0.6 MG tablet    Sig: Take 2 tabs (1.2 mg) at the onset of a gout flare and repeat 1 tab (0.6 mg) in 1 hour if symptoms persist    Dispense:  30 tablet    Refill:  6  . furosemide (LASIX) 40 MG tablet    Sig: Take 1 tablet (40 mg total) by mouth daily.    Dispense:  30 tablet    Refill:  6  . metoprolol succinate (TOPROL-XL) 100 MG 24 hr tablet    Sig: Take 2 tablets (200 mg total) by mouth daily. Take with or immediately following a meal.    Dispense:  60 tablet    Refill:  6    Follow-up: No follow-ups on file.   Charlott Rakes MD

## 2018-01-07 NOTE — Patient Instructions (Signed)

## 2018-01-08 ENCOUNTER — Telehealth: Payer: Self-pay

## 2018-01-08 ENCOUNTER — Encounter: Payer: Self-pay | Admitting: Family Medicine

## 2018-01-08 LAB — CMP14+EGFR
ALBUMIN: 4.1 g/dL (ref 3.6–4.8)
ALT: 24 IU/L (ref 0–44)
AST: 20 IU/L (ref 0–40)
Albumin/Globulin Ratio: 1.9 (ref 1.2–2.2)
Alkaline Phosphatase: 70 IU/L (ref 39–117)
BUN / CREAT RATIO: 10 (ref 10–24)
BUN: 14 mg/dL (ref 8–27)
Bilirubin Total: 0.3 mg/dL (ref 0.0–1.2)
CALCIUM: 9.3 mg/dL (ref 8.6–10.2)
CO2: 26 mmol/L (ref 20–29)
CREATININE: 1.39 mg/dL — AB (ref 0.76–1.27)
Chloride: 105 mmol/L (ref 96–106)
GFR calc Af Amer: 61 mL/min/{1.73_m2} (ref >59)
GFR, EST NON AFRICAN AMERICAN: 53 mL/min/{1.73_m2} — AB (ref >59)
GLOBULIN, TOTAL: 2.2 g/dL (ref 1.5–4.5)
GLUCOSE: 73 mg/dL (ref 65–99)
Potassium: 4 mmol/L (ref 3.5–5.2)
Sodium: 146 mmol/L — ABNORMAL HIGH (ref 134–144)
Total Protein: 6.3 g/dL (ref 6.0–8.5)

## 2018-01-08 NOTE — Telephone Encounter (Signed)
Patient was called and informed of lab results via voicemail. 

## 2018-03-04 MED FILL — ALLOPURINOL 300 MG TAB: 300 | 30 days supply | Qty: 30 | Fill #2

## 2018-03-04 MED FILL — $ELIQUIS 2.5 MG TABLET: 2.5 | 30 days supply | Qty: 60 | Fill #1

## 2018-03-29 ENCOUNTER — Telehealth: Payer: Self-pay | Admitting: Family Medicine

## 2018-03-29 DIAGNOSIS — M1A0720 Idiopathic chronic gout, left ankle and foot, without tophus (tophi): Secondary | ICD-10-CM

## 2018-03-29 NOTE — Telephone Encounter (Signed)
Dr. Laural Benes can you refill this medication for patient.

## 2018-03-29 NOTE — Telephone Encounter (Signed)
Patient called and requesteed for listed medication to be refilled due to his gout being flared up predniSONE (DELTASONE) 20 MG tablet [[id number]]   [facility] on [address]

## 2018-03-31 MED ORDER — PREDNISONE 20 MG PO TABS: 20.0000 mg | ORAL_TABLET | Freq: Two times a day (BID) | ORAL | 0 refills | Status: DC

## 2018-04-01 MED FILL — FUROSEMIDE 40 MG TAB: 40 | 30 days supply | Qty: 30 | Fill #1

## 2018-04-01 MED FILL — predniSONE 20 MG TABS: 20 | 5 days supply | Qty: 10 | Fill #0

## 2018-04-01 NOTE — Telephone Encounter (Signed)
Patient was called and another person answered the phone. Person was informed to have Jeffery Christian contact the office at his earliest convenience. Person hung up the phone.

## 2018-04-09 ENCOUNTER — Encounter: Payer: Self-pay | Admitting: Family Medicine

## 2018-04-09 ENCOUNTER — Ambulatory Visit: Payer: Medicare Other | Attending: Family Medicine | Admitting: Family Medicine

## 2018-04-09 VITALS — BP 153/92 | HR 65 | Temp 97.7°F | Ht 72.0 in | Wt 229.4 lb

## 2018-04-09 DIAGNOSIS — Z79899 Other long term (current) drug therapy: Secondary | ICD-10-CM | POA: Diagnosis not present

## 2018-04-09 DIAGNOSIS — I420 Dilated cardiomyopathy: Secondary | ICD-10-CM | POA: Diagnosis not present

## 2018-04-09 DIAGNOSIS — M1A0720 Idiopathic chronic gout, left ankle and foot, without tophus (tophi): Secondary | ICD-10-CM | POA: Diagnosis not present

## 2018-04-09 DIAGNOSIS — Z7901 Long term (current) use of anticoagulants: Secondary | ICD-10-CM | POA: Insufficient documentation

## 2018-04-09 DIAGNOSIS — I5022 Chronic systolic (congestive) heart failure: Secondary | ICD-10-CM | POA: Insufficient documentation

## 2018-04-09 DIAGNOSIS — M109 Gout, unspecified: Secondary | ICD-10-CM | POA: Diagnosis present

## 2018-04-09 DIAGNOSIS — Z7952 Long term (current) use of systemic steroids: Secondary | ICD-10-CM | POA: Insufficient documentation

## 2018-04-09 DIAGNOSIS — N182 Chronic kidney disease, stage 2 (mild): Secondary | ICD-10-CM | POA: Diagnosis not present

## 2018-04-09 DIAGNOSIS — I10 Essential (primary) hypertension: Secondary | ICD-10-CM

## 2018-04-09 DIAGNOSIS — I130 Hypertensive heart and chronic kidney disease with heart failure and stage 1 through stage 4 chronic kidney disease, or unspecified chronic kidney disease: Secondary | ICD-10-CM | POA: Diagnosis not present

## 2018-04-09 DIAGNOSIS — I481 Persistent atrial fibrillation: Secondary | ICD-10-CM | POA: Diagnosis not present

## 2018-04-09 DIAGNOSIS — I4819 Other persistent atrial fibrillation: Secondary | ICD-10-CM

## 2018-04-09 DIAGNOSIS — N183 Chronic kidney disease, stage 3 (moderate): Secondary | ICD-10-CM | POA: Insufficient documentation

## 2018-04-09 MED ORDER — FUROSEMIDE 40 MG PO TABS: 40.0000 mg | ORAL_TABLET | Freq: Every day | ORAL | 6 refills | Status: DC

## 2018-04-09 MED ORDER — METOPROLOL SUCCINATE ER 100 MG PO TB24: 200.0000 mg | ORAL_TABLET | Freq: Every day | ORAL | 6 refills | Status: DC

## 2018-04-09 MED ORDER — ALLOPURINOL 300 MG PO TABS: 300.0000 mg | ORAL_TABLET | Freq: Two times a day (BID) | ORAL | 6 refills | Status: DC

## 2018-04-09 MED ORDER — ATORVASTATIN CALCIUM 20 MG PO TABS: 20.0000 mg | ORAL_TABLET | Freq: Every day | ORAL | 6 refills | Status: DC

## 2018-04-09 MED ORDER — PREDNISONE 20 MG PO TABS: 20.0000 mg | ORAL_TABLET | Freq: Two times a day (BID) | ORAL | 0 refills | Status: DC

## 2018-04-09 MED ORDER — COLCHICINE 0.6 MG PO TABS: ORAL_TABLET | ORAL | 6 refills | Status: DC

## 2018-04-09 MED ORDER — APIXABAN 5 MG PO TABS: 5.0000 mg | ORAL_TABLET | Freq: Two times a day (BID) | ORAL | 6 refills | Status: DC

## 2018-04-09 MED FILL — predniSONE 20 MG TABS: 20 | 5 days supply | Qty: 10 | Fill #0

## 2018-04-09 MED FILL — METOPROLOL SUCCINATE ER 100: 100 | 30 days supply | Qty: 60 | Fill #0

## 2018-04-09 MED FILL — !COLCRYS 0.6 MG TABLET: 0.6 MG | 15 days supply | Qty: 30 | Fill #0

## 2018-04-09 MED FILL — ALLOPURINOL 300 MG TABLET: 300 | 30 days supply | Qty: 60 | Fill #0

## 2018-04-09 MED FILL — ATORVASTATIN 20 MG TABLET: 20 | 30 days supply | Qty: 30 | Fill #0

## 2018-04-09 MED FILL — $ELIQUIS 5 MG TABLET: 5 | 30 days supply | Qty: 60 | Fill #0

## 2018-04-09 NOTE — Patient Instructions (Signed)

## 2018-04-09 NOTE — Progress Notes (Signed)
Subjective:  Patient ID: Jeffery Christian, male    DOB: Jan 11, 1951  Age: 67 y.o. MRN: [id number]  CC: Gout   HPI Jeffery Christian is a 67 year old male with a history of acute on chronic CHF (EF 35-40% from echo of 07/2017), Atrial Fibrillation s/p DCCV, stage III chronic kidney disease, here for a follow-up visit He complains of a gout flare with swelling of the knuckles of his left hand which he has had for the last 1 week with associated pain rated at the 8/10. Of note he has had recurrent flares and had requested prednisone refill 2 weeks ago but just picked it up yesterday and has commenced it.  He denies excessive ingestion of beef or alcohol. With regards to his congestive heart failure he is doing well on Lasix and denies pedal edema, shortness of breath and has a good exercise tolerance.  He denies orthopnea, chest pain.  Never followed up with cardiology as he does not see the need to. Tolerating his antihypertensive and denies adverse effects from his medications; denies bruising from Eliquis. Denies additional concerns today.  Past Medical History:  Diagnosis Date  . Chronic systolic CHF (congestive heart failure) (HCC)    EF 35-40, diffuse HK, mild MR, moderate LAE, mild RAE, PASP 44, L pleural eff  . Dilated cardiomyopathy ([place])    likely related to tachycardia  . Persistent atrial fibrillation [facility])     Past Surgical History:  Procedure Laterality Date  . CARDIOVERSION N/A 08/20/2017   Procedure: CARDIOVERSION;  Surgeon: Jerline Pain, MD;  Location: [place];  Service: Cardiovascular;  Laterality: N/A;  . INCISION AND DRAINAGE ABSCESS Left 03/22/2014   Procedure: INCISION AND DRAINAGE ABSCESS LEFT BUTTOCK ABSCESS;  Surgeon: Zenovia Jarred, MD;  Location: [place];  Service: General;  Laterality: Left;    No Known Allergies   Outpatient Medications Prior to Visit  Medication Sig Dispense Refill  . allopurinol (ZYLOPRIM) 300 MG tablet Take 1 tablet (300 mg total) by mouth  daily. 30 tablet 6  . apixaban (ELIQUIS) 2.5 MG TABS tablet Take 1 tablet (2.5 mg total) by mouth 2 (two) times daily. 60 tablet 3  . colchicine 0.6 MG tablet Take 2 tabs (1.2 mg) at the onset of a gout flare and repeat 1 tab (0.6 mg) in 1 hour if symptoms persist 30 tablet 6  . furosemide (LASIX) 40 MG tablet Take 1 tablet (40 mg total) by mouth daily. 30 tablet 6  . predniSONE (DELTASONE) 20 MG tablet Take 1 tablet (20 mg total) by mouth 2 (two) times daily with a meal. 10 tablet 0  . metoprolol succinate (TOPROL-XL) 100 MG 24 hr tablet Take 2 tablets (200 mg total) by mouth daily. Take with or immediately following a meal. 60 tablet 6   No facility-administered medications prior to visit.     ROS Review of Systems  Constitutional: Negative for activity change and appetite change.  HENT: Negative for sinus pressure and sore throat.   Eyes: Negative for visual disturbance.  Respiratory: Negative for cough, chest tightness and shortness of breath.   Cardiovascular: Negative for chest pain and leg swelling.  Gastrointestinal: Negative for abdominal distention, abdominal pain, constipation and diarrhea.  Endocrine: Negative.   Genitourinary: Negative for dysuria.  Musculoskeletal:       See hpi  Skin: Negative for rash.  Allergic/Immunologic: Negative.   Neurological: Negative for weakness, light-headedness and numbness.  Psychiatric/Behavioral: Negative for dysphoric mood and suicidal ideas.    Objective:  BP (!) 153/92   Pulse 65   Temp 97.7 F (36.5 C) (Oral)   Ht 6' (1.829 m)   Wt 229 lb 6.4 oz (104.1 kg)   SpO2 95%   BMI 31.11 kg/m   BP/Weight 04/09/2018 01/07/2018 5/00/9381  Systolic BP 829 937 169  Diastolic BP 92 93 87  Wt. (Lbs) 229.4 239 214  BMI 31.11 32.41 29.02      Physical Exam  Constitutional: He is oriented to person, place, and time. He appears well-developed and well-nourished.  Neck: No JVD present.  Cardiovascular: Normal rate, normal heart sounds and  intact distal pulses.  No murmur heard. Pulmonary/Chest: Effort normal and breath sounds normal. He has no wheezes. He has no rales. He exhibits no tenderness.  Abdominal: Soft. Bowel sounds are normal. He exhibits no distension and no mass. There is no tenderness.  Musculoskeletal: He exhibits edema (edema of left 2nd MCP joint with associated erythema and warmth).  Neurological: He is alert and oriented to person, place, and time.  Skin: Skin is warm.  Psychiatric: He has a normal mood and affect.     CMP Latest Ref Rng & Units 01/07/2018 09/05/2017 08/20/2017  Glucose 65 - 99 mg/dL 73 82 111(H)  BUN 8 - 27 mg/dL 14 23 -  Creatinine 0.76 - 1.27 mg/dL 1.39(H) 1.48(H) -  Sodium 134 - 144 mmol/L 146(H) 146(H) 143  Potassium 3.5 - 5.2 mmol/L 4.0 4.0 4.1  Chloride 96 - 106 mmol/L 105 102 -  CO2 20 - 29 mmol/L 26 28 -  Calcium 8.6 - 10.2 mg/dL 9.3 9.9 -  Total Protein 6.0 - 8.5 g/dL 6.3 7.0 -  Total Bilirubin 0.0 - 1.2 mg/dL 0.3 0.5 -  Alkaline Phos 39 - 117 IU/L 70 86 -  AST 0 - 40 IU/L 20 28 -  ALT 0 - 44 IU/L 24 46(H) -     Assessment & Plan:   1. Idiopathic chronic gout of left ankle without tophus Acute on chronic Placed on prednisone Increased dose of allopurinol for prophylaxis Recurrence likely triggered by Lasix - predniSONE (DELTASONE) 20 MG tablet; Take 1 tablet (20 mg total) by mouth 2 (two) times daily with a meal.  Dispense: 10 tablet; Refill: 0 - allopurinol (ZYLOPRIM) 300 MG tablet; Take 1 tablet (300 mg total) by mouth 2 (two) times daily.  Dispense: 60 tablet; Refill: 6 - colchicine 0.6 MG tablet; Take 2 tabs (1.2 mg) at the onset of a gout flare and repeat 1 tab (0.6 mg) in 1 hour if symptoms persist  Dispense: 30 tablet; Refill: 6 - CMP14+EGFR - Uric Acid  2. Chronic systolic CHF (congestive heart failure) (HCC) EF 35 to 40%, NYHA II Euvolemic, weight is stable Continue Lasix, daily weight checks - furosemide (LASIX) 40 MG tablet; Take 1 tablet (40 mg total)  by mouth daily.  Dispense: 30 tablet; Refill: 6 - atorvastatin (LIPITOR) 20 MG tablet; Take 1 tablet (20 mg total) by mouth daily.  Dispense: 30 tablet; Refill: 6  3. Persistent atrial fibrillation (HCC) Stable Rate control with metoprolol and anticoagulation with Eliquis Increase Eliquis dose due to improvement in renal function - apixaban (ELIQUIS) 5 MG TABS tablet; Take 1 tablet (5 mg total) by mouth 2 (two) times daily.  Dispense: 60 tablet; Refill: 6  4. CKD (chronic kidney disease), stage II Creatinine has improved with reduction in Lasix dose and is now at 1.39 Avoid nephrotoxins  5. Essential hypertension Slightly elevated No regimen change today - metoprolol  succinate (TOPROL-XL) 100 MG 24 hr tablet; Take 2 tablets (200 mg total) by mouth daily. Take with or immediately following a meal.  Dispense: 60 tablet; Refill: 6   Meds ordered this encounter  Medications  . predniSONE (DELTASONE) 20 MG tablet    Sig: Take 1 tablet (20 mg total) by mouth 2 (two) times daily with a meal.    Dispense:  10 tablet    Refill:  0  . allopurinol (ZYLOPRIM) 300 MG tablet    Sig: Take 1 tablet (300 mg total) by mouth 2 (two) times daily.    Dispense:  60 tablet    Refill:  6    Discontinue previous dose  . furosemide (LASIX) 40 MG tablet    Sig: Take 1 tablet (40 mg total) by mouth daily.    Dispense:  30 tablet    Refill:  6  . metoprolol succinate (TOPROL-XL) 100 MG 24 hr tablet    Sig: Take 2 tablets (200 mg total) by mouth daily. Take with or immediately following a meal.    Dispense:  60 tablet    Refill:  6  . colchicine 0.6 MG tablet    Sig: Take 2 tabs (1.2 mg) at the onset of a gout flare and repeat 1 tab (0.6 mg) in 1 hour if symptoms persist    Dispense:  30 tablet    Refill:  6  . apixaban (ELIQUIS) 5 MG TABS tablet    Sig: Take 1 tablet (5 mg total) by mouth 2 (two) times daily.    Dispense:  60 tablet    Refill:  6    discontinue previous dose  . atorvastatin  (LIPITOR) 20 MG tablet    Sig: Take 1 tablet (20 mg total) by mouth daily.    Dispense:  30 tablet    Refill:  6    Follow-up: Return in about 3 months (around 07/10/2018) for Follow-up of chronic medical conditions.   Charlott Rakes MD

## 2018-04-10 LAB — CMP14+EGFR
ALT: 32 IU/L (ref 0–44)
AST: 21 IU/L (ref 0–40)
Albumin/Globulin Ratio: 1.5 (ref 1.2–2.2)
Albumin: 4.1 g/dL (ref 3.6–4.8)
Alkaline Phosphatase: 78 IU/L (ref 39–117)
BUN/Creatinine Ratio: 13 (ref 10–24)
BUN: 17 mg/dL (ref 8–27)
Bilirubin Total: 0.5 mg/dL (ref 0.0–1.2)
CHLORIDE: 103 mmol/L (ref 96–106)
CO2: 23 mmol/L (ref 20–29)
CREATININE: 1.33 mg/dL — AB (ref 0.76–1.27)
Calcium: 9.8 mg/dL (ref 8.6–10.2)
GFR calc Af Amer: 64 mL/min/{1.73_m2} (ref >59)
GFR calc non Af Amer: 56 mL/min/{1.73_m2} — ABNORMAL LOW (ref >59)
GLOBULIN, TOTAL: 2.7 g/dL (ref 1.5–4.5)
Glucose: 123 mg/dL — ABNORMAL HIGH (ref 65–99)
POTASSIUM: 4.1 mmol/L (ref 3.5–5.2)
SODIUM: 144 mmol/L (ref 134–144)
Total Protein: 6.8 g/dL (ref 6.0–8.5)

## 2018-04-10 LAB — URIC ACID: Uric Acid: 6.7 mg/dL (ref 3.7–8.6)

## 2018-05-21 MED FILL — FUROSEMIDE 40 MG TAB: 40 | 30 days supply | Qty: 30 | Fill #2

## 2018-05-21 MED FILL — ATORVASTATIN 20 MG TABLET: 20 | 30 days supply | Qty: 30 | Fill #1

## 2018-07-10 ENCOUNTER — Ambulatory Visit: Payer: Medicare Other | Attending: Family Medicine | Admitting: Family Medicine

## 2018-07-10 ENCOUNTER — Other Ambulatory Visit: Payer: Self-pay | Admitting: Family Medicine

## 2018-07-10 ENCOUNTER — Encounter: Payer: Self-pay | Admitting: Family Medicine

## 2018-07-10 VITALS — BP 138/90 | HR 68 | Temp 97.7°F | Ht 72.0 in | Wt 241.8 lb

## 2018-07-10 DIAGNOSIS — M1A0720 Idiopathic chronic gout, left ankle and foot, without tophus (tophi): Secondary | ICD-10-CM

## 2018-07-10 DIAGNOSIS — I420 Dilated cardiomyopathy: Secondary | ICD-10-CM | POA: Diagnosis not present

## 2018-07-10 DIAGNOSIS — I4819 Other persistent atrial fibrillation: Secondary | ICD-10-CM | POA: Diagnosis not present

## 2018-07-10 DIAGNOSIS — I130 Hypertensive heart and chronic kidney disease with heart failure and stage 1 through stage 4 chronic kidney disease, or unspecified chronic kidney disease: Secondary | ICD-10-CM | POA: Diagnosis not present

## 2018-07-10 DIAGNOSIS — I5022 Chronic systolic (congestive) heart failure: Secondary | ICD-10-CM | POA: Insufficient documentation

## 2018-07-10 DIAGNOSIS — R7303 Prediabetes: Secondary | ICD-10-CM | POA: Insufficient documentation

## 2018-07-10 DIAGNOSIS — Z79899 Other long term (current) drug therapy: Secondary | ICD-10-CM | POA: Insufficient documentation

## 2018-07-10 DIAGNOSIS — N183 Chronic kidney disease, stage 3 (moderate): Secondary | ICD-10-CM | POA: Insufficient documentation

## 2018-07-10 DIAGNOSIS — I10 Essential (primary) hypertension: Secondary | ICD-10-CM | POA: Diagnosis present

## 2018-07-10 DIAGNOSIS — Z7901 Long term (current) use of anticoagulants: Secondary | ICD-10-CM | POA: Diagnosis not present

## 2018-07-10 DIAGNOSIS — Z87898 Personal history of other specified conditions: Secondary | ICD-10-CM | POA: Diagnosis not present

## 2018-07-10 LAB — POCT GLYCOSYLATED HEMOGLOBIN (HGB A1C): Hemoglobin A1C: 5.1 % (ref 4.0–5.6)

## 2018-07-10 MED ORDER — PREDNISONE 20 MG PO TABS: 20.0000 mg | ORAL_TABLET | Freq: Two times a day (BID) | ORAL | 0 refills | Status: DC

## 2018-07-10 MED FILL — FUROSEMIDE 40 MG TAB: 40 | 30 days supply | Qty: 30 | Fill #3

## 2018-07-10 MED FILL — METOPROLOL SUCCINATE ER 100: 100 | 30 days supply | Qty: 60 | Fill #1

## 2018-07-10 MED FILL — ALLOPURINOL 300 MG TAB: 300 | 30 days supply | Qty: 30 | Fill #3

## 2018-07-10 MED FILL — ATORVASTATIN 20 MG TABLET: 20 | 30 days supply | Qty: 30 | Fill #2

## 2018-07-10 MED FILL — predniSONE 20 MG TABS: 20 | 5 days supply | Qty: 10 | Fill #0

## 2018-07-10 NOTE — Progress Notes (Signed)
Subjective:  Patient ID: Jeffery Christian, male    DOB: September 18, 1951  Age: 67 y.o. MRN: [id number]  CC: Hypertension   HPI [name] is a 67 year old male with a history of acute on chronic CHF (EF 35-40% from echo of 07/2017), Atrial Fibrillation s/p DCCV, stage III chronic kidney disease, Gout here for a follow-up visit Since his last visit 3 months ago he has had a couple of gout flares for which he requested prednisone but he informs me he has been compliant with allopurinol and uses prednisone and colchicine for flares.  He also tries to avoid red meats and other foods that trigger gout. With regards to his chronic kidney disease, his creatinine is down to 1.33 from 1.8 last year.  He has had no CHF exacerbations lately and denies pedal edema, shortness of breath, orthopnea and has a good exercise tolerance.  He has began to lift weights.  Doing well on his current dose of Lasix. He has not followed up with cardiology in a while and refuses to do so. Denies chest pain, wheezing, abdominal pain.  Past Medical History:  Diagnosis Date  . Chronic systolic CHF (congestive heart failure) (HCC)    EF 35-40, diffuse HK, mild MR, moderate LAE, mild RAE, PASP 44, L pleural eff  . Dilated cardiomyopathy (HCC)    likely related to tachycardia  . Persistent atrial fibrillation     Past Surgical History:  Procedure Laterality Date  . CARDIOVERSION N/A 08/20/2017   Procedure: CARDIOVERSION;  Surgeon: [name], MD;  Location: [facility];  Service: Cardiovascular;  Laterality: N/A;  . INCISION AND DRAINAGE ABSCESS Left 03/22/2014   Procedure: INCISION AND DRAINAGE ABSCESS LEFT BUTTOCK ABSCESS;  Surgeon: [name], MD;  Location: [facility];  Service: General;  Laterality: Left;    No Known Allergies   Outpatient Medications Prior to Visit  Medication Sig Dispense Refill  . allopurinol (ZYLOPRIM) 300 MG tablet Take 1 tablet (300 mg total) by mouth 2 (two) times daily. 60 tablet 6  .  apixaban (ELIQUIS) 5 MG TABS tablet Take 1 tablet (5 mg total) by mouth 2 (two) times daily. 60 tablet 6  . atorvastatin (LIPITOR) 20 MG tablet Take 1 tablet (20 mg total) by mouth daily. 30 tablet 6  . colchicine 0.6 MG tablet Take 2 tabs (1.2 mg) at the onset of a gout flare and repeat 1 tab (0.6 mg) in 1 hour if symptoms persist 30 tablet 6  . furosemide (LASIX) 40 MG tablet Take 1 tablet (40 mg total) by mouth daily. 30 tablet 6  . predniSONE (DELTASONE) 20 MG tablet Take 1 tablet (20 mg total) by mouth 2 (two) times daily with a meal. 10 tablet 0  . metoprolol succinate (TOPROL-XL) 100 MG 24 hr tablet Take 2 tablets (200 mg total) by mouth daily. Take with or immediately following a meal. 60 tablet 6   No facility-administered medications prior to visit.     ROS Review of Systems  Constitutional: Negative for activity change and appetite change.  HENT: Negative for sinus pressure and sore throat.   Eyes: Negative for visual disturbance.  Respiratory: Negative for cough, chest tightness and shortness of breath.   Cardiovascular: Negative for chest pain and leg swelling.  Gastrointestinal: Negative for abdominal distention, abdominal pain, constipation and diarrhea.  Endocrine: Negative.   Genitourinary: Negative for dysuria.  Musculoskeletal: Negative for joint swelling and myalgias.  Skin: Negative for rash.  Allergic/Immunologic: Negative.   Neurological: Negative for  weakness, light-headedness and numbness.  Psychiatric/Behavioral: Negative for dysphoric mood and suicidal ideas.    Objective:  BP 138/90   Pulse 68   Temp 97.7 F (36.5 C) (Oral)   Ht 6' (1.829 m)   Wt 241 lb 12.8 oz (109.7 kg)   SpO2 92%   BMI 32.79 kg/m   BP/Weight 07/10/2018 04/09/2018 01/07/2018  Systolic BP 138 153 141  Diastolic BP 90 92 93  Wt. (Lbs) 241.8 229.4 239  BMI 32.79 31.11 32.41      Physical Exam  Constitutional: He is oriented to person, place, and time. He appears well-developed and  well-nourished.  HENT:  Right Ear: External ear normal.  Left Ear: External ear normal.  Mouth/Throat: Oropharynx is clear and moist.  Neck: No JVD present.  Cardiovascular: Normal rate, normal heart sounds and intact distal pulses.  No murmur heard. Pulmonary/Chest: Effort normal and breath sounds normal. He has no wheezes. He has no rales. He exhibits no tenderness.  Abdominal: Soft. Bowel sounds are normal. He exhibits no distension and no mass. There is no tenderness.  Musculoskeletal: Normal range of motion.  Neurological: He is alert and oriented to person, place, and time.  Skin: Skin is warm and dry.  Psychiatric: He has a normal mood and affect.     CMP Latest Ref Rng & Units 04/09/2018 01/07/2018 09/05/2017  Glucose 65 - 99 mg/dL 662(H) 73 82  BUN 8 - 27 mg/dL 17 14 23   Creatinine 0.76 - 1.27 mg/dL 4.76(L) 4.65(K) 3.54(S)  Sodium 134 - 144 mmol/L 144 146(H) 146(H)  Potassium 3.5 - 5.2 mmol/L 4.1 4.0 4.0  Chloride 96 - 106 mmol/L 103 105 102  CO2 20 - 29 mmol/L 23 26 28   Calcium 8.6 - 10.2 mg/dL 9.8 9.3 9.9  Total Protein 6.0 - 8.5 g/dL 6.8 6.3 7.0  Total Bilirubin 0.0 - 1.2 mg/dL 0.5 0.3 0.5  Alkaline Phos 39 - 117 IU/L 78 70 86  AST 0 - 40 IU/L 21 20 28   ALT 0 - 44 IU/L 32 24 46(H)    Lab Results  Component Value Date   HGBA1C 5.1 07/10/2018     Lab Results  Component Value Date   LABURIC 6.7 04/09/2018    Assessment & Plan:   1. Idiopathic chronic gout of left ankle without tophus No acute flare at this time but since his last visit he has had a couple of flares Compliant with allopurinol and uses colchicine for flares The fact that he is on Lasix predisposes him to gout flares Emphasized the need to comply with a low purine eating plan - predniSONE (DELTASONE) 20 MG tablet; Take 1 tablet (20 mg total) by mouth 2 (two) times daily with a meal.  Dispense: 10 tablet; Refill: 0  2. History of prediabetes Last A1c was 6.3 We will check again today - POCT  glycosylated hemoglobin (Hb A1C)  3. Chronic systolic CHF (congestive heart failure) (HCC) EF 35 to 40% from echo of 07/2017 Euvolemic Consider repeat echocardiogram at next visit Continue Lasix, beta-blocker Might benefit from ACE inhibitor initiation at his next visit  4. Persistent atrial fibrillation On anticoagulation with Eliquis and rate control with metoprolol   Meds ordered this encounter  Medications  . predniSONE (DELTASONE) 20 MG tablet    Sig: Take 1 tablet (20 mg total) by mouth 2 (two) times daily with a meal.    Dispense:  10 tablet    Refill:  0    Follow-up: Return in  about 3 months (around 10/10/2018) for Follow-up of chronic medical conditions.   Hoy Register MD

## 2018-07-10 NOTE — Telephone Encounter (Signed)
Pt is in clinic. Will be addressed by PCP.

## 2018-08-13 NOTE — Telephone Encounter (Signed)
Opened in error

## 2018-09-27 MED FILL — !ELIQUIS 5MG TABLET: 5 | 30 days supply | Qty: 60 | Fill #1

## 2018-09-27 MED FILL — ATORVASTATIN 20 MG TABLET: 20 | 30 days supply | Qty: 30 | Fill #3

## 2018-09-27 MED FILL — FUROSEMIDE 40 MG TAB: 40 | 30 days supply | Qty: 30 | Fill #4

## 2018-09-27 MED FILL — ALLOPURINOL 300 MG TAB: 300 | 30 days supply | Qty: 30 | Fill #0

## 2018-10-08 ENCOUNTER — Ambulatory Visit: Payer: Medicare Other | Admitting: Family Medicine

## 2018-10-28 ENCOUNTER — Encounter: Payer: Self-pay | Admitting: Family Medicine

## 2018-10-28 ENCOUNTER — Ambulatory Visit: Payer: Medicare Other | Attending: Family Medicine | Admitting: Family Medicine

## 2018-10-28 VITALS — BP 146/120 | HR 85 | Temp 97.8°F | Ht 72.0 in | Wt 239.0 lb

## 2018-10-28 DIAGNOSIS — I420 Dilated cardiomyopathy: Secondary | ICD-10-CM | POA: Insufficient documentation

## 2018-10-28 DIAGNOSIS — Z79899 Other long term (current) drug therapy: Secondary | ICD-10-CM | POA: Insufficient documentation

## 2018-10-28 DIAGNOSIS — M1A0720 Idiopathic chronic gout, left ankle and foot, without tophus (tophi): Secondary | ICD-10-CM | POA: Insufficient documentation

## 2018-10-28 DIAGNOSIS — I4819 Other persistent atrial fibrillation: Secondary | ICD-10-CM

## 2018-10-28 DIAGNOSIS — I130 Hypertensive heart and chronic kidney disease with heart failure and stage 1 through stage 4 chronic kidney disease, or unspecified chronic kidney disease: Secondary | ICD-10-CM | POA: Diagnosis not present

## 2018-10-28 DIAGNOSIS — Z7901 Long term (current) use of anticoagulants: Secondary | ICD-10-CM | POA: Diagnosis not present

## 2018-10-28 DIAGNOSIS — I5022 Chronic systolic (congestive) heart failure: Secondary | ICD-10-CM | POA: Insufficient documentation

## 2018-10-28 DIAGNOSIS — I10 Essential (primary) hypertension: Secondary | ICD-10-CM

## 2018-10-28 DIAGNOSIS — Z1159 Encounter for screening for other viral diseases: Secondary | ICD-10-CM | POA: Insufficient documentation

## 2018-10-28 MED ORDER — APIXABAN 5 MG PO TABS: 5.0000 mg | ORAL_TABLET | Freq: Two times a day (BID) | ORAL | 6 refills | Status: DC

## 2018-10-28 MED ORDER — FUROSEMIDE 40 MG PO TABS: 40.0000 mg | ORAL_TABLET | Freq: Every day | ORAL | 6 refills | Status: DC

## 2018-10-28 MED ORDER — ALLOPURINOL 300 MG PO TABS: 300.0000 mg | ORAL_TABLET | Freq: Two times a day (BID) | ORAL | 6 refills | Status: DC

## 2018-10-28 MED ORDER — METOPROLOL SUCCINATE ER 100 MG PO TB24: 200.0000 mg | ORAL_TABLET | Freq: Every day | ORAL | 6 refills | Status: DC

## 2018-10-28 MED ORDER — COLCHICINE 0.6 MG PO TABS: ORAL_TABLET | ORAL | 6 refills | Status: DC

## 2018-10-28 MED ORDER — PNEUMOCOCCAL 13-VAL CONJ VACC IM SUSP: 0.5000 mL | INTRAMUSCULAR | 0 refills | Status: AC

## 2018-10-28 MED ORDER — ATORVASTATIN CALCIUM 20 MG PO TABS: 20.0000 mg | ORAL_TABLET | Freq: Every day | ORAL | 6 refills | Status: DC

## 2018-10-28 MED FILL — ALLOPURINOL 300 MG TAB: 300 | 30 days supply | Qty: 60 | Fill #0

## 2018-10-28 MED FILL — FUROSEMIDE 40 MG TAB: 40 | 30 days supply | Qty: 30 | Fill #0

## 2018-10-28 MED FILL — !ELIQUIS 5MG TABLET: 5 | 30 days supply | Qty: 60 | Fill #0

## 2018-10-28 MED FILL — ATORVASTATIN 20 MG TABLET: 20 | 30 days supply | Qty: 30 | Fill #0

## 2018-10-28 MED FILL — !COLCRYS 0.6 MG TABLET: 0.6 MG | 10 days supply | Qty: 30 | Fill #0

## 2018-10-28 MED FILL — METOPROLOL SUCCINATE ER 100: 100 | 30 days supply | Qty: 60 | Fill #0

## 2018-10-28 NOTE — Progress Notes (Signed)
Subjective:  Patient ID: Jeffery Christian, male    DOB: [birth year]  Age: 68 y.o. MRN: [id number]  CC: Hypertension  HPI Jeffery Christian  is a 68 year old male with a history of acute on chronic CHF (EF 35-40% from echo of 07/2017), Atrial Fibrillation s/p DCCV, stage III chronic kidney disease, Gout here for a follow-up visit He has had frequent Gout flares and is getting over a flare which occurred in his right elbow. He adheres to a low purine diet but drinks alcohol intermittently. He has no pedal edema and has 2 pillow orthopnea, is dyspneic only on moderate to severe exertion. Doing well  On his antihypertensive and has no side effects from his medications. With regards to health maintenance he declines a colonoscopy.  Past Medical History:  Diagnosis Date  . Chronic systolic CHF (congestive heart failure) (HCC)    EF 35-40, diffuse HK, mild MR, moderate LAE, mild RAE, PASP 44, L pleural eff  . Dilated cardiomyopathy ([place])    likely related to tachycardia  . Persistent atrial fibrillation     Past Surgical History:  Procedure Laterality Date  . CARDIOVERSION N/A 08/20/2017   Procedure: CARDIOVERSION;  Surgeon: Jerline Pain, MD;  Location: [facility];  Service: Cardiovascular;  Laterality: N/A;  . INCISION AND DRAINAGE ABSCESS Left 03/22/2014   Procedure: INCISION AND DRAINAGE ABSCESS LEFT BUTTOCK ABSCESS;  Surgeon: Zenovia Jarred, MD;  Location: [facility];  Service: General;  Laterality: Left;    No Known Allergies   Outpatient Medications Prior to Visit  Medication Sig Dispense Refill  . allopurinol (ZYLOPRIM) 300 MG tablet Take 1 tablet (300 mg total) by mouth 2 (two) times daily. 60 tablet 6  . apixaban (ELIQUIS) 5 MG TABS tablet Take 1 tablet (5 mg total) by mouth 2 (two) times daily. 60 tablet 6  . atorvastatin (LIPITOR) 20 MG tablet Take 1 tablet (20 mg total) by mouth daily. 30 tablet 6  . colchicine 0.6 MG tablet Take 2 tabs (1.2 mg) at the onset of a gout flare and repeat 1  tab (0.6 mg) in 1 hour if symptoms persist 30 tablet 6  . furosemide (LASIX) 40 MG tablet Take 1 tablet (40 mg total) by mouth daily. 30 tablet 6  . predniSONE (DELTASONE) 20 MG tablet Take 1 tablet (20 mg total) by mouth 2 (two) times daily with a meal. (Patient not taking: Reported on 10/28/2018) 10 tablet 0  . metoprolol succinate (TOPROL-XL) 100 MG 24 hr tablet Take 2 tablets (200 mg total) by mouth daily. Take with or immediately following a meal. 60 tablet 6   No facility-administered medications prior to visit.     ROS Review of Systems  Constitutional: Negative for activity change and appetite change.  HENT: Negative for sinus pressure and sore throat.   Eyes: Negative for visual disturbance.  Respiratory: Negative for cough, chest tightness and shortness of breath.   Cardiovascular: Negative for chest pain and leg swelling.  Gastrointestinal: Negative for abdominal distention, abdominal pain, constipation and diarrhea.  Endocrine: Negative.   Genitourinary: Negative for dysuria.  Musculoskeletal:       See hpi  Skin: Negative for rash.  Allergic/Immunologic: Negative.   Neurological: Negative for weakness, light-headedness and numbness.  Psychiatric/Behavioral: Negative for dysphoric mood and suicidal ideas.    Objective:  BP (!) 146/120   Pulse 85   Temp 97.8 F (36.6 C) (Oral)   Ht 6' (1.829 m)   Wt 239 lb (108.4 kg)   SpO2  96%   BMI 32.41 kg/m   BP/Weight 10/28/2018 32/12/5571 11/03/252  Systolic BP 270 623 762  Diastolic BP 831 90 92  Wt. (Lbs) 239 241.8 229.4  BMI 32.41 32.79 31.11      Physical Exam Constitutional:      Appearance: He is well-developed.  Cardiovascular:     Rate and Rhythm: Normal rate.     Heart sounds: Normal heart sounds. No murmur.  Pulmonary:     Effort: Pulmonary effort is normal.     Breath sounds: Normal breath sounds. No wheezing or rales.  Chest:     Chest wall: No tenderness.  Abdominal:     General: Bowel sounds are  normal. There is no distension.     Palpations: Abdomen is soft. There is no mass.     Tenderness: There is no abdominal tenderness.  Musculoskeletal: Normal range of motion.     Comments: Slight right elbow erythema,not TTP  Neurological:     Mental Status: He is alert and oriented to person, place, and time.    CMP Latest Ref Rng & Units 04/09/2018 01/07/2018 09/05/2017  Glucose 65 - 99 mg/dL 123(H) 73 82  BUN 8 - 27 mg/dL '17 14 23  '$ Creatinine 0.76 - 1.27 mg/dL 1.33(H) 1.39(H) 1.48(H)  Sodium 134 - 144 mmol/L 144 146(H) 146(H)  Potassium 3.5 - 5.2 mmol/L 4.1 4.0 4.0  Chloride 96 - 106 mmol/L 103 105 102  CO2 20 - 29 mmol/L '23 26 28  '$ Calcium 8.6 - 10.2 mg/dL 9.8 9.3 9.9  Total Protein 6.0 - 8.5 g/dL 6.8 6.3 7.0  Total Bilirubin 0.0 - 1.2 mg/dL 0.5 0.3 0.5  Alkaline Phos 39 - 117 IU/L 78 70 86  AST 0 - 40 IU/L '21 20 28  '$ ALT 0 - 44 IU/L 32 24 46(H)     Assessment & Plan:   1. Idiopathic chronic gout of left ankle without tophus Recurrent flares Advised to work on cutting back on alcohol consumption - allopurinol (ZYLOPRIM) 300 MG tablet; Take 1 tablet (300 mg total) by mouth 2 (two) times daily.  Dispense: 60 tablet; Refill: 6 - colchicine 0.6 MG tablet; Take 2 tabs (1.2 mg) at the onset of a gout flare and repeat 1 tab (0.6 mg) in 1 hour if symptoms persist  Dispense: 30 tablet; Refill: 6  2. Persistent atrial fibrillation On anticoagulation with Eliquis and rate control with Metoprolol - apixaban (ELIQUIS) 5 MG TABS tablet; Take 1 tablet (5 mg total) by mouth 2 (two) times daily.  Dispense: 60 tablet; Refill: 6  3. Chronic systolic CHF (congestive heart failure) (HCC) EF 35-40% from 07/2017 Euvolemic  - atorvastatin (LIPITOR) 20 MG tablet; Take 1 tablet (20 mg total) by mouth daily.  Dispense: 30 tablet; Refill: 6 - furosemide (LASIX) 40 MG tablet; Take 1 tablet (40 mg total) by mouth daily.  Dispense: 30 tablet; Refill: 6  4. Essential hypertension Slightly elevated BP Yet  to take antihypertensive Counseled on blood pressure goal of less than 130/80, low-sodium, DASH diet, medication compliance, 150 minutes of moderate intensity exercise per week. Discussed medication compliance, adverse effects. - metoprolol succinate (TOPROL-XL) 100 MG 24 hr tablet; Take 2 tablets (200 mg total) by mouth daily. Take with or immediately following a meal.  Dispense: 60 tablet; Refill: 6 - CMP14+EGFR - Lipid panel  5. Need for hepatitis C screening test - Hepatitis c antibody (reflex)   Meds ordered this encounter  Medications  . allopurinol (ZYLOPRIM) 300 MG tablet  Sig: Take 1 tablet (300 mg total) by mouth 2 (two) times daily.    Dispense:  60 tablet    Refill:  6    Discontinue previous dose  . apixaban (ELIQUIS) 5 MG TABS tablet    Sig: Take 1 tablet (5 mg total) by mouth 2 (two) times daily.    Dispense:  60 tablet    Refill:  6    discontinue previous dose  . atorvastatin (LIPITOR) 20 MG tablet    Sig: Take 1 tablet (20 mg total) by mouth daily.    Dispense:  30 tablet    Refill:  6  . colchicine 0.6 MG tablet    Sig: Take 2 tabs (1.2 mg) at the onset of a gout flare and repeat 1 tab (0.6 mg) in 1 hour if symptoms persist    Dispense:  30 tablet    Refill:  6  . furosemide (LASIX) 40 MG tablet    Sig: Take 1 tablet (40 mg total) by mouth daily.    Dispense:  30 tablet    Refill:  6  . metoprolol succinate (TOPROL-XL) 100 MG 24 hr tablet    Sig: Take 2 tablets (200 mg total) by mouth daily. Take with or immediately following a meal.    Dispense:  60 tablet    Refill:  6    Follow-up: Return in about 3 months (around 01/27/2019) for follow up of chronic medical conditions.   Charlott Rakes MD

## 2018-10-29 LAB — CMP14+EGFR
A/G RATIO: 2 (ref 1.2–2.2)
ALK PHOS: 83 IU/L (ref 39–117)
ALT: 38 IU/L (ref 0–44)
AST: 29 IU/L (ref 0–40)
Albumin: 4.5 g/dL (ref 3.8–4.8)
BUN/Creatinine Ratio: 9 — ABNORMAL LOW (ref 10–24)
BUN: 16 mg/dL (ref 8–27)
Bilirubin Total: 0.5 mg/dL (ref 0.0–1.2)
CALCIUM: 9.9 mg/dL (ref 8.6–10.2)
CO2: 25 mmol/L (ref 20–29)
CREATININE: 1.75 mg/dL — AB (ref 0.76–1.27)
Chloride: 106 mmol/L (ref 96–106)
GFR calc Af Amer: 46 mL/min/{1.73_m2} — ABNORMAL LOW (ref >59)
GFR, EST NON AFRICAN AMERICAN: 40 mL/min/{1.73_m2} — AB (ref >59)
Globulin, Total: 2.2 g/dL (ref 1.5–4.5)
Glucose: 101 mg/dL — ABNORMAL HIGH (ref 65–99)
Potassium: 5.2 mmol/L (ref 3.5–5.2)
SODIUM: 147 mmol/L — AB (ref 134–144)
Total Protein: 6.7 g/dL (ref 6.0–8.5)

## 2018-10-29 LAB — LIPID PANEL
CHOL/HDL RATIO: 3.6 ratio (ref 0.0–5.0)
CHOLESTEROL TOTAL: 139 mg/dL (ref 100–199)
HDL: 39 mg/dL — AB (ref >39)
LDL Calculated: 65 mg/dL (ref 0–99)
TRIGLYCERIDES: 174 mg/dL — AB (ref 0–149)
VLDL Cholesterol Cal: 35 mg/dL (ref 5–40)

## 2018-10-29 LAB — HEPATITIS C ANTIBODY (REFLEX): HCV Ab: <0.1 s/co ratio (ref 0.0–0.9)

## 2018-10-29 LAB — HCV COMMENT:

## 2018-11-01 ENCOUNTER — Telehealth: Payer: Self-pay

## 2018-11-01 NOTE — Telephone Encounter (Signed)
Patient was called and informed of lab results. 

## 2018-11-01 NOTE — Telephone Encounter (Signed)
-----   Message from Hoy Register, MD sent at 10/29/2018  4:54 PM EST ----- Kidney function has worsened slightly, total cholesterol is normal however triglycerides which is a type of cholesterol is slightly elevated.  Use of fish oil capsules OTC will be beneficial.  I would suggest avoiding medications like NSAIDs to prevent worsening of kidney function.  Hepatitis C screen is negative.

## 2019-01-27 ENCOUNTER — Ambulatory Visit: Payer: Medicare Other | Admitting: Family Medicine

## 2019-09-16 ENCOUNTER — Other Ambulatory Visit: Payer: Self-pay | Admitting: Family Medicine

## 2019-09-16 DIAGNOSIS — M1A0720 Idiopathic chronic gout, left ankle and foot, without tophus (tophi): Secondary | ICD-10-CM

## 2019-09-16 MED ORDER — PREDNISONE 20 MG PO TABS: 20.0000 mg | ORAL_TABLET | Freq: Two times a day (BID) | ORAL | 0 refills | Status: DC

## 2019-09-16 MED FILL — predniSONE 20 MG TABS: 20 | 5 days supply | Qty: 10 | Fill #0

## 2019-09-16 NOTE — Telephone Encounter (Signed)
1) Medication(s) Requested (by name):predniSONE (DELTASONE) 20 MG tablet [[id number]   2) [facility] [address]     3) Special Requests: for gout    Approved medications will be sent to the pharmacy, we will reach out if there is an issue.  Requests made after 3pm may not be addressed until the following business day!  If a patient is unsure of the name of the medication(s) please note and ask patient to call back when they are able to provide all info, do not send to responsible party until all information is available!

## 2019-09-30 ENCOUNTER — Ambulatory Visit: Payer: Medicare Other | Admitting: Family Medicine

## 2020-04-07 ENCOUNTER — Other Ambulatory Visit: Payer: Self-pay | Admitting: Family Medicine

## 2020-04-07 ENCOUNTER — Telehealth: Payer: Self-pay

## 2020-04-07 DIAGNOSIS — M1A0720 Idiopathic chronic gout, left ankle and foot, without tophus (tophi): Secondary | ICD-10-CM

## 2020-04-07 MED ORDER — PREDNISONE 20 MG PO TABS: 20.0000 mg | ORAL_TABLET | Freq: Two times a day (BID) | ORAL | 0 refills | Status: DC

## 2020-04-07 NOTE — Telephone Encounter (Signed)
Medication Refill - Medication: predniSONE (DELTASONE) 20 MG tablet  Pt is experiencing a gout flare up and is in excruciating pain   Has the patient contacted their pharmacy? Yes.   (Agent: If no, request that the patient contact the pharmacy for the refill.) (Agent: If yes, when and what did the pharmacy advise?)  Preferred Pharmacy (with phone number or street name):  [phone] [facility], [place] - [address]  [address] [place] [address]  Phone: [phone] Fax: [phone]    Agent: Please be advised that RX refills may take up to 3 business days. We ask that you follow-up with your pharmacy.

## 2020-04-07 NOTE — Telephone Encounter (Signed)
Prescription has been sent to his pharmacy.

## 2020-04-07 NOTE — Telephone Encounter (Signed)
Patient is requesting refill on Prednisone for his gout flare.

## 2020-04-08 MED ORDER — PREDNISONE 20 MG PO TABS: 20.0000 mg | ORAL_TABLET | Freq: Two times a day (BID) | ORAL | 0 refills | Status: DC

## 2020-04-08 MED FILL — predniSONE 20 MG TABS: 20 | 5 days supply | Qty: 10 | Fill #0

## 2020-04-08 NOTE — Telephone Encounter (Signed)
Rx forwarded to patient preferred pharmacy.

## 2020-04-08 NOTE — Telephone Encounter (Signed)
Patient stated that medication was sent to incorrect pharmacy. Patient would like [facility]one [facility]  [id number], [place] - [address] Phone:  [phone]  Fax:  [phone]

## 2020-05-11 ENCOUNTER — Other Ambulatory Visit: Payer: Self-pay | Admitting: Family Medicine

## 2020-05-11 DIAGNOSIS — M1A0720 Idiopathic chronic gout, left ankle and foot, without tophus (tophi): Secondary | ICD-10-CM

## 2020-05-11 NOTE — Telephone Encounter (Signed)
Requested medication (s) are due for refill today: yes  Requested medication (s) are on the active medication list: yes  Last refill:  Allopurinol: 10/28/18    Prednisone: 04/08/20  Future visit scheduled: yes  Notes to clinic:  Pt is having a gout flare up Prednisone: med not delegated to NT to RF   Requested Prescriptions  Pending Prescriptions Disp Refills   allopurinol (ZYLOPRIM) 300 MG tablet 60 tablet 6    Sig: Take 1 tablet (300 mg total) by mouth 2 (two) times daily.      Endocrinology:  Gout Agents Failed - 05/11/2020  4:05 PM      Failed - Uric Acid in normal range and within 360 days    Uric Acid  Date Value Ref Range Status  04/09/2018 6.7 3.7 - 8.6 mg/dL Final    Comment:               Therapeutic target for gout patients: <6.0          Failed - Cr in normal range and within 360 days    Creatinine, Ser  Date Value Ref Range Status  10/28/2018 1.75 (H) 0.76 - 1.27 mg/dL Final   Creatinine, Urine  Date Value Ref Range Status  07/19/2017 22.80 mg/dL Final          Failed - Valid encounter within last 12 months    Recent Outpatient Visits           1 year ago Need for hepatitis C screening test   [facility] H[facility] Register, [facility]   1 year ago History of prediabetes   [facility] Hoy Register, MD   2 years ago CKD (chronic kidney disease), stage II   [facility] [place], Odette Horns, MD   2 years ago Atrial fibrillation with R[facility])   [facility] [place], Odette Horns, MD   2 years ago Persistent atrial fibrillation [facility])   [facility] [place], Odette Horns, MD       Future Appointments             In 4 weeks Hoy Register, MD [facility]              predniSONE (DELTASONE) 20 MG tablet 10 tablet 0    Sig: Take 1 tablet (20 mg total) by mouth 2 (two) times daily with a meal.       Not Delegated - Endocrinology:  Oral Corticosteroids Failed - 05/11/2020  4:05 PM      Failed - This refill cannot be delegated      Failed - Last BP in normal range    BP Readings from Last 1 Encounters:  10/28/18 (!) 146/120          Failed - Valid encounter within last 6 months    Recent Outpatient Visits           1 year ago Need for hepatitis C screening test   [facility] Hoy Register, MD   1 year ago History of prediabetes   [facility] Hoy Register, MD   2 years ago CKD (chronic kidney disease), stage II   [facility] [place], Odette Horns, MD   2 years ago Atrial fibrillation with [facility])    [facility] Hoy Register, MD   2  years ago Persistent atrial fibrillation [facility])   [facility] Hoy Register, MD       Future Appointments             In 4 weeks Hoy Register, MD [facility]

## 2020-05-11 NOTE — Telephone Encounter (Signed)
Medication Refill - Medication: allipurinol, prednisone   Has the patient contacted their pharmacy? Yes.   Pt states that he is having a flare up of gout. Please advise.  (Agent: If no, request that the patient contact the pharmacy for the refill.) (Agent: If yes, when and what did the pharmacy advise?)  Preferred Pharmacy (with phone number or street name):  [phone], [place] - [address]  [address] [place] [address]  Phone: [phone] Fax: [phone]  Hours: Not open 24 hours     Agent: Please be advised that RX refills may take up to 3 business days. We ask that you follow-up with your pharmacy.

## 2020-05-12 ENCOUNTER — Telehealth: Payer: Self-pay | Admitting: Family Medicine

## 2020-05-12 DIAGNOSIS — M1A0720 Idiopathic chronic gout, left ankle and foot, without tophus (tophi): Secondary | ICD-10-CM

## 2020-05-12 NOTE — Telephone Encounter (Signed)
Patient calling back to check status of request.  °

## 2020-05-12 NOTE — Telephone Encounter (Signed)
Patient checking on the status of allopurinol (ZYLOPRIM) 300 MG tablet request, for gout flare up. Patient would like request expedited due to relying on daughter to pick up rx. Patient would like a follow up whe done     [id number], [place] - [address] Phone:  [phone]  Fax:  [phone]

## 2020-05-12 NOTE — Telephone Encounter (Signed)
Pt is calling for status of medication refill . Please advise

## 2020-05-13 MED ORDER — PREDNISONE 20 MG PO TABS: 20.0000 mg | ORAL_TABLET | Freq: Two times a day (BID) | ORAL | 0 refills | Status: DC

## 2020-05-13 MED ORDER — ALLOPURINOL 300 MG PO TABS: 300.0000 mg | ORAL_TABLET | Freq: Two times a day (BID) | ORAL | 0 refills | Status: DC

## 2020-05-13 NOTE — Telephone Encounter (Signed)
Patient has an appointment 06/08/2020. Will send refills to last until that appointment.

## 2020-06-05 ENCOUNTER — Other Ambulatory Visit: Payer: Self-pay | Admitting: Family Medicine

## 2020-06-05 DIAGNOSIS — M1A0720 Idiopathic chronic gout, left ankle and foot, without tophus (tophi): Secondary | ICD-10-CM

## 2020-06-05 NOTE — Telephone Encounter (Signed)
Requested medications are due for refill today?  Yes  Requested medications are on active medication list?  Yes   Last Refill:  05/13/2020  # 60 with no refills  Future visit scheduled?  Yes in 3 days  Notes to Clinic:  Medication failed RX refill protocol due to no valid encounter in the past year and no labs within past 360 days.

## 2020-06-08 ENCOUNTER — Encounter: Payer: Self-pay | Admitting: Family Medicine

## 2020-06-08 ENCOUNTER — Other Ambulatory Visit: Payer: Self-pay

## 2020-06-08 ENCOUNTER — Other Ambulatory Visit: Payer: Self-pay | Admitting: Family Medicine

## 2020-06-08 ENCOUNTER — Ambulatory Visit: Payer: Medicare Other | Admitting: Family Medicine

## 2020-06-08 ENCOUNTER — Ambulatory Visit: Payer: Medicare Other | Attending: Family Medicine | Admitting: Family Medicine

## 2020-06-08 VITALS — BP 110/75 | HR 65 | Ht 72.0 in | Wt 207.0 lb

## 2020-06-08 DIAGNOSIS — Z7901 Long term (current) use of anticoagulants: Secondary | ICD-10-CM | POA: Insufficient documentation

## 2020-06-08 DIAGNOSIS — R7303 Prediabetes: Secondary | ICD-10-CM | POA: Diagnosis not present

## 2020-06-08 DIAGNOSIS — R399 Unspecified symptoms and signs involving the genitourinary system: Secondary | ICD-10-CM

## 2020-06-08 DIAGNOSIS — Z79899 Other long term (current) drug therapy: Secondary | ICD-10-CM | POA: Diagnosis not present

## 2020-06-08 DIAGNOSIS — M79672 Pain in left foot: Secondary | ICD-10-CM

## 2020-06-08 DIAGNOSIS — M1A0720 Idiopathic chronic gout, left ankle and foot, without tophus (tophi): Secondary | ICD-10-CM

## 2020-06-08 DIAGNOSIS — I4891 Unspecified atrial fibrillation: Secondary | ICD-10-CM | POA: Insufficient documentation

## 2020-06-08 DIAGNOSIS — I4819 Other persistent atrial fibrillation: Secondary | ICD-10-CM | POA: Diagnosis not present

## 2020-06-08 DIAGNOSIS — I5022 Chronic systolic (congestive) heart failure: Secondary | ICD-10-CM

## 2020-06-08 DIAGNOSIS — N3001 Acute cystitis with hematuria: Secondary | ICD-10-CM

## 2020-06-08 DIAGNOSIS — Z9114 Patient's other noncompliance with medication regimen: Secondary | ICD-10-CM | POA: Diagnosis not present

## 2020-06-08 DIAGNOSIS — I130 Hypertensive heart and chronic kidney disease with heart failure and stage 1 through stage 4 chronic kidney disease, or unspecified chronic kidney disease: Secondary | ICD-10-CM | POA: Diagnosis not present

## 2020-06-08 DIAGNOSIS — I420 Dilated cardiomyopathy: Secondary | ICD-10-CM | POA: Insufficient documentation

## 2020-06-08 DIAGNOSIS — I10 Essential (primary) hypertension: Secondary | ICD-10-CM | POA: Diagnosis not present

## 2020-06-08 DIAGNOSIS — T380X5A Adverse effect of glucocorticoids and synthetic analogues, initial encounter: Secondary | ICD-10-CM | POA: Insufficient documentation

## 2020-06-08 DIAGNOSIS — N183 Chronic kidney disease, stage 3 unspecified: Secondary | ICD-10-CM | POA: Diagnosis not present

## 2020-06-08 DIAGNOSIS — H539 Unspecified visual disturbance: Secondary | ICD-10-CM

## 2020-06-08 DIAGNOSIS — Z7952 Long term (current) use of systemic steroids: Secondary | ICD-10-CM | POA: Diagnosis not present

## 2020-06-08 DIAGNOSIS — Z91148 Patient's other noncompliance with medication regimen for other reason: Secondary | ICD-10-CM

## 2020-06-08 LAB — POCT URINALYSIS DIP (CLINITEK)
Bilirubin, UA: NEGATIVE
Glucose, UA: NEGATIVE mg/dL
Ketones, POC UA: NEGATIVE mg/dL
Nitrite, UA: POSITIVE — AB
POC PROTEIN,UA: 30 — AB
Spec Grav, UA: 1.025 (ref 1.010–1.025)
Urobilinogen, UA: 0.2 E.U./dL
pH, UA: 5.5 (ref 5.0–8.0)

## 2020-06-08 LAB — POCT GLYCOSYLATED HEMOGLOBIN (HGB A1C): HbA1c, POC (controlled diabetic range): 5.3 % (ref 0.0–7.0)

## 2020-06-08 MED ORDER — CIPROFLOXACIN HCL 500 MG PO TABS: 500.0000 mg | ORAL_TABLET | Freq: Two times a day (BID) | ORAL | 0 refills | Status: DC

## 2020-06-08 MED ORDER — APIXABAN 5 MG PO TABS: 5.0000 mg | ORAL_TABLET | Freq: Two times a day (BID) | ORAL | 6 refills | Status: DC

## 2020-06-08 MED ORDER — COLCHICINE 0.6 MG PO TABS: ORAL_TABLET | ORAL | 6 refills | Status: DC

## 2020-06-08 MED ORDER — FUROSEMIDE 20 MG PO TABS: 20.0000 mg | ORAL_TABLET | Freq: Every day | ORAL | 6 refills | Status: DC

## 2020-06-08 MED ORDER — PREDNISONE 20 MG PO TABS: 20.0000 mg | ORAL_TABLET | Freq: Two times a day (BID) | ORAL | 0 refills | Status: DC

## 2020-06-08 MED ORDER — METOPROLOL SUCCINATE ER 25 MG PO TB24: 25.0000 mg | ORAL_TABLET | Freq: Every day | ORAL | 6 refills | Status: DC

## 2020-06-08 MED ORDER — ALLOPURINOL 300 MG PO TABS: 300.0000 mg | ORAL_TABLET | Freq: Two times a day (BID) | ORAL | 6 refills | Status: DC

## 2020-06-08 MED ORDER — ATORVASTATIN CALCIUM 20 MG PO TABS: 20.0000 mg | ORAL_TABLET | Freq: Every day | ORAL | 6 refills | Status: DC

## 2020-06-08 MED FILL — METOPROLOL SUCCINATE ER 25: 25 | 30 days supply | Qty: 30 | Fill #0

## 2020-06-08 MED FILL — COLCHICINE 0.6 MG TABS: 0.6 | 15 days supply | Qty: 30 | Fill #0

## 2020-06-08 MED FILL — ATORVASTATIN CALCIUM 20 MG: 20 | 30 days supply | Qty: 30 | Fill #0

## 2020-06-08 MED FILL — FUROSEMIDE 20 MG TABS: 20 | 30 days supply | Qty: 30 | Fill #0

## 2020-06-08 MED FILL — predniSONE 20 MG TABS: 20 | 5 days supply | Qty: 10 | Fill #0

## 2020-06-08 MED FILL — ELIQUIS 5 MG TABLET: 5 | 30 days supply | Qty: 60 | Fill #0

## 2020-06-08 MED FILL — ALLOPURINOL 300 MG TAB: 300 | 30 days supply | Qty: 60 | Fill #0

## 2020-06-08 MED FILL — CIPROFLOXACIN HCL 500 MG TA: 500 | 3 days supply | Qty: 6 | Fill #0

## 2020-06-08 NOTE — Progress Notes (Signed)
Subjective:  Patient ID: Jeffery Christian, male    DOB: 1950-11-16  Age: 69 y.o. MRN: [id number]  CC: Hypertension and Gout   HPI Jeffery Christian is a 69 year old male with a history of acute on chronic CHF (EF 35-40% from echo of 07/2017), Atrial Fibrillation s/p DCCV, stage III chronic kidney disease, Gout here for a follow-up visit accompanied by a friend of his. Last OV was 18 months ago.   He has been in bed most days due to Gout flares. On further questioning he has not been compliant with Allopurinol and has either been taking it as needed or skipping all together. Denies ingestion of red meat. Complains of Diarrhea with Colchicine. He has been calling the Clinic a lot requesting Prednisone refills for acute Gout. Pain is feet is a 5/10 and he has associated edema. Has not been taking Furosemide as he states he does not need it. Compliance with Eliquis for his Afib cannot be ascertained as he is confused with regards to his medication. I am also unsure if he has been compliant with Metoprolol but his BP is on the low side. Denies presence of chest pain. He has lost about 30 lbs since his last visit.  Something fell on fell on his L 2nd toe sometime ago and he would like his foot checked out. Would like his urine checked as "it has a funny color"; denies presence of dysuria, frequency. Also complains of blurry vision and would like his eyes examined. He does not drink alcohol and has not used drugs in over a year.  Past Medical History:  Diagnosis Date  . Chronic systolic CHF (congestive heart failure) (HCC)    EF 35-40, diffuse HK, mild MR, moderate LAE, mild RAE, PASP 44, L pleural eff  . Dilated cardiomyopathy ([place])    likely related to tachycardia  . Persistent atrial fibrillation [facility])     Past Surgical History:  Procedure Laterality Date  . CARDIOVERSION N/A 08/20/2017   Procedure: CARDIOVERSION;  Surgeon: Jerline Pain, MD;  Location: [place];  Service: Cardiovascular;   Laterality: N/A;  . INCISION AND DRAINAGE ABSCESS Left 03/22/2014   Procedure: INCISION AND DRAINAGE ABSCESS LEFT BUTTOCK ABSCESS;  Surgeon: Zenovia Jarred, MD;  Location: [place];  Service: General;  Laterality: Left;    Family History  Problem Relation Age of Onset  . Leukemia Father        46  . Cancer Mother     No Known Allergies  Outpatient Medications Prior to Visit  Medication Sig Dispense Refill  . allopurinol (ZYLOPRIM) 300 MG tablet Take 1 tablet (300 mg total) by mouth 2 (two) times daily. 60 tablet 0  . apixaban (ELIQUIS) 5 MG TABS tablet Take 1 tablet (5 mg total) by mouth 2 (two) times daily. 60 tablet 6  . atorvastatin (LIPITOR) 20 MG tablet Take 1 tablet (20 mg total) by mouth daily. 30 tablet 6  . colchicine 0.6 MG tablet Take 2 tabs (1.2 mg) at the onset of a gout flare and repeat 1 tab (0.6 mg) in 1 hour if symptoms persist 30 tablet 6  . furosemide (LASIX) 40 MG tablet Take 1 tablet (40 mg total) by mouth daily. 30 tablet 6  . metoprolol succinate (TOPROL-XL) 100 MG 24 hr tablet Take 2 tablets (200 mg total) by mouth daily. Take with or immediately following a meal. 60 tablet 6  . predniSONE (DELTASONE) 20 MG tablet Take 1 tablet (20 mg total) by mouth 2 (two)  times daily with a meal. (Patient not taking: Reported on 06/08/2020) 10 tablet 0   No facility-administered medications prior to visit.     ROS Review of Systems  Constitutional: Negative for activity change and appetite change.  HENT: Negative for sinus pressure and sore throat.   Eyes: Negative for visual disturbance.  Respiratory: Negative for cough, chest tightness and shortness of breath.   Cardiovascular: Negative for chest pain and leg swelling.  Gastrointestinal: Negative for abdominal distention, abdominal pain, constipation and diarrhea.  Endocrine: Negative.   Genitourinary: Negative for dysuria.  Musculoskeletal:       See HPI  Skin: Negative for rash.  Allergic/Immunologic: Negative.     Neurological: Negative for weakness, light-headedness and numbness.  Psychiatric/Behavioral: Negative for dysphoric mood and suicidal ideas.    Objective:  BP 110/75   Pulse 65   Ht 6' (1.829 m)   Wt 207 lb (93.9 kg)   SpO2 98%   BMI 28.07 kg/m   BP/Weight 06/08/2020 10/28/2018 77/05/2422  Systolic BP 536 144 315  Diastolic BP 75 400 90  Wt. (Lbs) 207 239 241.8  BMI 28.07 32.41 32.79      Physical Exam Constitutional:      Appearance: He is well-developed.  Neck:     Vascular: No JVD.  Cardiovascular:     Rate and Rhythm: Normal rate.     Heart sounds: Normal heart sounds. No murmur heard.   Pulmonary:     Effort: Pulmonary effort is normal.     Breath sounds: Normal breath sounds. No wheezing or rales.  Chest:     Chest wall: No tenderness.  Abdominal:     General: Bowel sounds are normal. There is no distension.     Palpations: Abdomen is soft. There is no mass.     Tenderness: There is no abdominal tenderness.  Musculoskeletal:        General: Deformity (hammer toes of 2nd toe b/l) present. Normal range of motion.     Right lower leg: Edema (1+ ankle edema) present.     Left lower leg: Left lower leg edema: 1+ankle edema.  Neurological:     Mental Status: He is alert and oriented to person, place, and time.  Psychiatric:        Mood and Affect: Mood normal.     CMP Latest Ref Rng & Units 10/28/2018 04/09/2018 01/07/2018  Glucose 65 - 99 mg/dL 101(H) 123(H) 73  BUN 8 - 27 mg/dL $Remove'16 17 14  'zqzAtMS$ Creatinine 0.76 - 1.27 mg/dL 1.75(H) 1.33(H) 1.39(H)  Sodium 134 - 144 mmol/L 147(H) 144 146(H)  Potassium 3.5 - 5.2 mmol/L 5.2 4.1 4.0  Chloride 96 - 106 mmol/L 106 103 105  CO2 20 - 29 mmol/L $RemoveB'25 23 26  'UMMQsdiC$ Calcium 8.6 - 10.2 mg/dL 9.9 9.8 9.3  Total Protein 6.0 - 8.5 g/dL 6.7 6.8 6.3  Total Bilirubin 0.0 - 1.2 mg/dL 0.5 0.5 0.3  Alkaline Phos 39 - 117 IU/L 83 78 70  AST 0 - 40 IU/L $Remov'29 21 20  'VEkDLQ$ ALT 0 - 44 IU/L 38 32 24    Lipid Panel     Component Value Date/Time   CHOL 139  10/28/2018 1014   TRIG 174 (H) 10/28/2018 1014   HDL 39 (L) 10/28/2018 1014   CHOLHDL 3.6 10/28/2018 1014   LDLCALC 65 10/28/2018 1014    CBC    Component Value Date/Time   WBC 9.3 08/05/2017 0508   RBC 5.48 08/05/2017 0508   HGB 17.7 (  H) 08/20/2017 0817   HCT 52.0 08/20/2017 0817   PLT 241 08/05/2017 0508   MCV 86.3 08/05/2017 0508   MCH 28.5 08/05/2017 0508   MCHC 33.0 08/05/2017 0508   RDW 14.5 08/05/2017 0508   LYMPHSABS 1.3 08/05/2017 0508   MONOABS 0.7 08/05/2017 0508   EOSABS 0.0 08/05/2017 0508   BASOSABS 0.0 08/05/2017 0508    Lab Results  Component Value Date   HGBA1C 5.3 06/08/2020    Assessment & Plan:  1. Persistent atrial fibrillation (HCC) In sinus rhythm Reduced Metoprolol dose due to hypotension Continue anticoagulation with Eliquis - apixaban (ELIQUIS) 5 MG TABS tablet; Take 1 tablet (5 mg total) by mouth 2 (two) times daily.  Dispense: 60 tablet; Refill: 6  2. Idiopathic chronic gout of left ankle without tophus Uncontrolled due to non compliance with Allopurinol Advised that if he is unable to tolerate Colchicine due to diarrhea, antimotility agents can be used otherwise we can discontinue altogether but he would like to stay on it Re-educated on proper medication administration Avoid triggers - allopurinol (ZYLOPRIM) 300 MG tablet; Take 1 tablet (300 mg total) by mouth 2 (two) times daily.  Dispense: 60 tablet; Refill: 6 - predniSONE (DELTASONE) 20 MG tablet; Take 1 tablet (20 mg total) by mouth 2 (two) times daily with a meal.  Dispense: 10 tablet; Refill: 0 - colchicine 0.6 MG tablet; Take 2 tabs (1.2 mg) at the onset of a gout flare and repeat 1 tab (0.6 mg) in 1 hour if symptoms persist  Dispense: 30 tablet; Refill: 6 - CMP14+EGFR - Uric Acid  3. Essential hypertension Controlled Reduced Metoprolol dose Counseled on blood pressure goal of less than 130/80, low-sodium, DASH diet, medication compliance, 150 minutes of moderate intensity  exercise per week. Discussed medication compliance, adverse effects. - metoprolol succinate (TOPROL-XL) 25 MG 24 hr tablet; Take 1 tablet (25 mg total) by mouth daily. Take with or immediately following a meal.  Dispense: 30 tablet; Refill: 6  4. Chronic systolic CHF (congestive heart failure) (HCC) EF 35-40% He does have some pedal edema due to non compliance with Lasix I have refilled Lasix at a lower dose as he is reluctant to take it due to its likelihood of triggerring Gout flares - atorvastatin (LIPITOR) 20 MG tablet; Take 1 tablet (20 mg total) by mouth daily.  Dispense: 30 tablet; Refill: 6 - furosemide (LASIX) 20 MG tablet; Take 1 tablet (20 mg total) by mouth daily.  Dispense: 30 tablet; Refill: 6  5. Non compliance w medication regimen Discussed the need to be compliant with medications   6. Urinary symptom or sign UA positive for UTI with hemature - POCT URINALYSIS DIP (CLINITEK)  7. Vision abnormalities Vision is 20/30 Will need to screen for DM  8. Prediabetes Due to chronic steroid use will need to check A1c - POCT glycosylated hemoglobin (Hb A1C)  9. Foot pain, left Secondary to trauma - DG Foot Complete Left; Future  10. Acute cystitis with hematuria Treated - ciprofloxacin (CIPRO) 500 MG tablet; Take 1 tablet (500 mg total) by mouth 2 (two) times daily.  Dispense: 6 tablet; Refill: 0    Meds ordered this encounter  Medications  . apixaban (ELIQUIS) 5 MG TABS tablet    Sig: Take 1 tablet (5 mg total) by mouth 2 (two) times daily.    Dispense:  60 tablet    Refill:  6  . allopurinol (ZYLOPRIM) 300 MG tablet    Sig: Take 1 tablet (300 mg total)  by mouth 2 (two) times daily.    Dispense:  60 tablet    Refill:  6  . metoprolol succinate (TOPROL-XL) 25 MG 24 hr tablet    Sig: Take 1 tablet (25 mg total) by mouth daily. Take with or immediately following a meal.    Dispense:  30 tablet    Refill:  6    Dose decrease  . predniSONE (DELTASONE) 20 MG tablet      Sig: Take 1 tablet (20 mg total) by mouth 2 (two) times daily with a meal.    Dispense:  10 tablet    Refill:  0  . atorvastatin (LIPITOR) 20 MG tablet    Sig: Take 1 tablet (20 mg total) by mouth daily.    Dispense:  30 tablet    Refill:  6  . colchicine 0.6 MG tablet    Sig: Take 2 tabs (1.2 mg) at the onset of a gout flare and repeat 1 tab (0.6 mg) in 1 hour if symptoms persist    Dispense:  30 tablet    Refill:  6  . furosemide (LASIX) 20 MG tablet    Sig: Take 1 tablet (20 mg total) by mouth daily.    Dispense:  30 tablet    Refill:  6    Dose decrease  . ciprofloxacin (CIPRO) 500 MG tablet    Sig: Take 1 tablet (500 mg total) by mouth 2 (two) times daily.    Dispense:  6 tablet    Refill:  0    Follow-up: Return in about 3 months (around 09/07/2020) for chronic disease management.       Charlott Rakes, MD, FAAFP. [facility] and [place], [place]   06/08/2020, 4:59 PM

## 2020-06-08 NOTE — Progress Notes (Signed)
Needs refill on medications.  Has been having real bad gout flares.  Urine is a funny color.

## 2020-06-09 LAB — CMP14+EGFR
ALT: 14 IU/L (ref 0–44)
AST: 11 IU/L (ref 0–40)
Albumin/Globulin Ratio: 1.4 (ref 1.2–2.2)
Albumin: 3.9 g/dL (ref 3.8–4.8)
Alkaline Phosphatase: 101 IU/L (ref 48–121)
BUN/Creatinine Ratio: 9 — ABNORMAL LOW (ref 10–24)
BUN: 13 mg/dL (ref 8–27)
Bilirubin Total: 0.4 mg/dL (ref 0.0–1.2)
CO2: 22 mmol/L (ref 20–29)
Calcium: 9.6 mg/dL (ref 8.6–10.2)
Chloride: 110 mmol/L — ABNORMAL HIGH (ref 96–106)
Creatinine, Ser: 1.48 mg/dL — ABNORMAL HIGH (ref 0.76–1.27)
GFR calc Af Amer: 56 mL/min/{1.73_m2} — ABNORMAL LOW (ref >59)
GFR calc non Af Amer: 48 mL/min/{1.73_m2} — ABNORMAL LOW (ref >59)
Globulin, Total: 2.7 g/dL (ref 1.5–4.5)
Glucose: 184 mg/dL — ABNORMAL HIGH (ref 65–99)
Potassium: 4.6 mmol/L (ref 3.5–5.2)
Sodium: 146 mmol/L — ABNORMAL HIGH (ref 134–144)
Total Protein: 6.6 g/dL (ref 6.0–8.5)

## 2020-06-09 LAB — URIC ACID: Uric Acid: 6.1 mg/dL (ref 3.8–8.4)

## 2020-06-10 ENCOUNTER — Telehealth: Payer: Self-pay

## 2020-06-10 NOTE — Telephone Encounter (Signed)
-----   Message from Hoy Register, MD sent at 06/10/2020  6:36 AM EDT ----- Kidney function is slightly abnormal but stable. Labs do not point towards Gout exacerbation. Advise to continue with Lasix as swelling in his legs is due to his CHF

## 2020-06-10 NOTE — Telephone Encounter (Signed)
Patient was called and informed of lab results via voicemail. 

## 2020-07-16 ENCOUNTER — Telehealth: Payer: Self-pay | Admitting: Family Medicine

## 2020-07-16 DIAGNOSIS — M1A0720 Idiopathic chronic gout, left ankle and foot, without tophus (tophi): Secondary | ICD-10-CM

## 2020-07-16 MED ORDER — PREDNISONE 20 MG PO TABS: ORAL_TABLET | ORAL | 0 refills | Status: DC

## 2020-07-16 NOTE — Telephone Encounter (Signed)
Tried to contact patient to schedule an appt and he did not answer. Left a voice mail.   Please see if appropriate to send an Rx for gout.   Copied from CRM [id number]. Topic: Appointment Scheduling - Scheduling Inquiry for Clinic >> Jul 15, 2020  1:28 PM Leafy Ro wrote: Reason for CRM: Pt is calling and would like an appt . Pt is having a gout flare up. Pt last seen for gout issue on 06-08-2020. Pt said if he can not be seen he is ok with md calling in another round of prednisone into [address]

## 2020-07-16 NOTE — Telephone Encounter (Signed)
Will route to PCP for review. 

## 2020-09-08 ENCOUNTER — Other Ambulatory Visit: Payer: Self-pay

## 2020-09-08 ENCOUNTER — Ambulatory Visit: Payer: Medicare Other | Admitting: Family Medicine

## 2020-09-08 ENCOUNTER — Ambulatory Visit: Payer: Medicare Other | Attending: Family Medicine | Admitting: Family Medicine

## 2020-09-08 ENCOUNTER — Encounter: Payer: Self-pay | Admitting: Family Medicine

## 2020-09-08 VITALS — BP 145/71 | HR 89 | Temp 98.3°F | Ht 72.0 in | Wt 217.6 lb

## 2020-09-08 DIAGNOSIS — Z23 Encounter for immunization: Secondary | ICD-10-CM

## 2020-09-08 DIAGNOSIS — I4819 Other persistent atrial fibrillation: Secondary | ICD-10-CM | POA: Diagnosis not present

## 2020-09-08 DIAGNOSIS — Z1211 Encounter for screening for malignant neoplasm of colon: Secondary | ICD-10-CM

## 2020-09-08 DIAGNOSIS — M1A0720 Idiopathic chronic gout, left ankle and foot, without tophus (tophi): Secondary | ICD-10-CM

## 2020-09-08 DIAGNOSIS — I10 Essential (primary) hypertension: Secondary | ICD-10-CM | POA: Diagnosis not present

## 2020-09-08 DIAGNOSIS — Z0000 Encounter for general adult medical examination without abnormal findings: Secondary | ICD-10-CM | POA: Diagnosis not present

## 2020-09-08 MED FILL — FUROSEMIDE 20 MG TABS: 20 | 30 days supply | Qty: 30 | Fill #1

## 2020-09-08 MED FILL — COLCHICINE 0.6 MG TABS: 0.6 | 15 days supply | Qty: 30 | Fill #1

## 2020-09-08 MED FILL — METOPROLOL SUCCINATE ER 25: 25 | 30 days supply | Qty: 30 | Fill #1

## 2020-09-08 MED FILL — ALLOPURINOL 300 MG TAB: 300 | 30 days supply | Qty: 60 | Fill #1

## 2020-09-08 MED FILL — ATORVASTATIN CALCIUM 20 MG: 20 | 30 days supply | Qty: 30 | Fill #1

## 2020-09-08 MED FILL — ELIQUIS 5 MG TABLET: 5 | 30 days supply | Qty: 60 | Fill #1

## 2020-09-08 NOTE — Progress Notes (Signed)
Pt states that he passes out at breakfast a couple of weeks ago.

## 2020-09-08 NOTE — Progress Notes (Signed)
Subjective:  Patient ID: Jeffery Christian, male    DOB: Oct 09, 1950  Age: 69 y.o. MRN: [id number]  CC: Hypertension   HPI Jeffery Christian is a 69 year old male with a history of acute on chronic CHF (EF 35-40% from echo of 07/2017), Atrial Fibrillation s/p DCCV, stage III chronic kidney disease, Gout here for a follow-up visit  He has been out of his medications for a little over a week and wanted to wait till his appointment today to refill. He was having breakfast at an eating place he goes to regularly then felt woozy and slumped over. EMS came, did an EKG and told him symptoms were due to lack of medications.  He has not had a repeat episode. Denies pedal edema, dyspnea, chest pain, palpitations. Gained 10 lbs in the last 3 months He does not exercise regularly. Denies recent gout flares and has no acute concerns today.  Past Medical History:  Diagnosis Date  . Chronic systolic CHF (congestive heart failure) (HCC)    EF 35-40, diffuse HK, mild MR, moderate LAE, mild RAE, PASP 44, L pleural eff  . Dilated cardiomyopathy (HCC)    likely related to tachycardia  . Persistent atrial fibrillation [facility])     Past Surgical History:  Procedure Laterality Date  . CARDIOVERSION N/A 08/20/2017   Procedure: CARDIOVERSION;  Surgeon: [name], MD;  Location: [facility];  Service: Cardiovascular;  Laterality: N/A;  . INCISION AND DRAINAGE ABSCESS Left 03/22/2014   Procedure: INCISION AND DRAINAGE ABSCESS LEFT BUTTOCK ABSCESS;  Surgeon: [name], MD;  Location: [facility] OR;  Service: General;  Laterality: Left;    Family History  Problem Relation Age of Onset  . Leukemia Father        63  . Cancer Mother     No Known Allergies  Outpatient Medications Prior to Visit  Medication Sig Dispense Refill  . allopurinol (ZYLOPRIM) 300 MG tablet Take 1 tablet (300 mg total) by mouth 2 (two) times daily. 60 tablet 6  . apixaban (ELIQUIS) 5 MG TABS tablet Take 1 tablet (5 mg total) by mouth 2 (two)  times daily. 60 tablet 6  . atorvastatin (LIPITOR) 20 MG tablet Take 1 tablet (20 mg total) by mouth daily. 30 tablet 6  . colchicine 0.6 MG tablet Take 2 tabs (1.2 mg) at the onset of a gout flare and repeat 1 tab (0.6 mg) in 1 hour if symptoms persist 30 tablet 6  . furosemide (LASIX) 20 MG tablet Take 1 tablet (20 mg total) by mouth daily. 30 tablet 6  . ciprofloxacin (CIPRO) 500 MG tablet Take 1 tablet (500 mg total) by mouth 2 (two) times daily. (Patient not taking: Reported on 09/08/2020) 6 tablet 0  . metoprolol succinate (TOPROL-XL) 25 MG 24 hr tablet Take 1 tablet (25 mg total) by mouth daily. Take with or immediately following a meal. 30 tablet 6  . predniSONE (DELTASONE) 20 MG tablet 2 tabs daily x 2 days, then 1.5 tabs daily x 2 days then 1 tab PO daily x 2 days (Patient not taking: Reported on 09/08/2020) 9 tablet 0   No facility-administered medications prior to visit.     ROS Review of Systems  Constitutional: Negative for activity change and appetite change.  HENT: Negative for sinus pressure and sore throat.   Eyes: Negative for visual disturbance.  Respiratory: Negative for cough, chest tightness and shortness of breath.   Cardiovascular: Negative for chest pain and leg swelling.  Gastrointestinal: Negative for abdominal  distention, abdominal pain, constipation and diarrhea.  Endocrine: Negative.   Genitourinary: Negative for dysuria.  Musculoskeletal: Negative for joint swelling and myalgias.  Skin: Negative for rash.  Allergic/Immunologic: Negative.   Neurological: Negative for weakness, light-headedness and numbness.  Psychiatric/Behavioral: Negative for dysphoric mood and suicidal ideas.    Objective:  BP (!) 145/71   Pulse 89   Temp 98.3 F (36.8 C) (Oral)   Ht 6' (1.829 m)   Wt 217 lb 9.6 oz (98.7 kg)   SpO2 98%   BMI 29.51 kg/m   BP/Weight 09/08/2020 06/08/2020 10/28/2018  Systolic BP 145 110 146  Diastolic BP 71 75 120  Wt. (Lbs) 217.6 207 239  BMI  29.51 28.07 32.41      Physical Exam Constitutional:      Appearance: He is well-developed.  Neck:     Vascular: No JVD.  Cardiovascular:     Rate and Rhythm: Normal rate.     Heart sounds: Normal heart sounds. No murmur heard.   Pulmonary:     Effort: Pulmonary effort is normal.     Breath sounds: Normal breath sounds. No wheezing or rales.  Chest:     Chest wall: No tenderness.  Abdominal:     General: Bowel sounds are normal. There is no distension.     Palpations: Abdomen is soft. There is no mass.     Tenderness: There is no abdominal tenderness.  Musculoskeletal:        General: Normal range of motion.     Right lower leg: No edema.     Left lower leg: No edema.  Neurological:     Mental Status: He is alert and oriented to person, place, and time.  Psychiatric:        Mood and Affect: Mood normal.     CMP Latest Ref Rng & Units 06/08/2020 10/28/2018 04/09/2018  Glucose 65 - 99 mg/dL 465(K) 354(S) 568(L)  BUN 8 - 27 mg/dL 13 16 17   Creatinine 0.76 - 1.27 mg/dL ) 2.75(T) 7.00(F)  Sodium 134 - 144 mmol/L 146(H) 147(H) 144  Potassium 3.5 - 5.2 mmol/L 4.6 5.2 4.1  Chloride 96 - 106 mmol/L 110(H) 106 103  CO2 20 - 29 mmol/L 22 25 23   Calcium 8.6 - 10.2 mg/dL 9.6 9.9 9.8  Total Protein 6.0 - 8.5 g/dL 6.6 6.7 6.8  Total Bilirubin 0.0 - 1.2 mg/dL 0.4 0.5 0.5  Alkaline Phos 48 - 121 IU/L 101 83 78  AST 0 - 40 IU/L 11 29 21   ALT 0 - 44 IU/L 14 38 32    Lipid Panel     Component Value Date/Time   CHOL 139 10/28/2018 1014   TRIG 174 (H) 10/28/2018 1014   HDL 39 (L) 10/28/2018 1014   CHOLHDL 3.6 10/28/2018 1014   LDLCALC 65 10/28/2018 1014    CBC    Component Value Date/Time   WBC 9.3 08/05/2017 0508   RBC 5.48 08/05/2017 0508   HGB 17.7 (H) 08/20/2017 0817   HCT 52.0 08/20/2017 0817   PLT 241 08/05/2017 0508   MCV 86.3 08/05/2017 0508   MCH 28.5 08/05/2017 0508   MCHC 33.0 08/05/2017 0508   RDW 14.5 08/05/2017 0508   LYMPHSABS 1.3 08/05/2017 0508    MONOABS 0.7 08/05/2017 0508   EOSABS 0.0 08/05/2017 0508   BASOSABS 0.0 08/05/2017 0508    Lab Results  Component Value Date   HGBA1C 5.3 06/08/2020    Assessment & Plan:  1. Idiopathic chronic gout  of left ankle without tophus Stable with no recent flares Continue allopurinol  2. Screening for colon cancer Declines colonoscopy - Cologuard  3. Persistent atrial fibrillation (HCC) Currently in sinus rhythm Continue metoprolol for rate control and Eliquis for anticoagulation  4. Essential hypertension Uncontrolled due to running out of antihypertensive which I have refilled Counseled on blood pressure goal of less than 130/80, low-sodium, DASH diet, medication compliance, 150 minutes of moderate intensity exercise per week. Discussed medication compliance, adverse effects.  5. Healthcare maintenance - Pneumococcal polysaccharide vaccine 23-valent greater than or equal to 2yo subcutaneous/IM .   No orders of the defined types were placed in this encounter.   Follow-up: Return in about 3 months (around 12/07/2020) for Chronic disease management.       Hoy Register, MD, FAAFP. [facility] [place], [place] [phone]   09/08/2020, 1:32 PM

## 2020-09-10 MED FILL — predniSONE 20 MG TABS: 20 | 5 days supply | Qty: 10 | Fill #0

## 2020-11-09 ENCOUNTER — Emergency Department (HOSPITAL_COMMUNITY): Payer: Medicare Other

## 2020-11-09 ENCOUNTER — Encounter (HOSPITAL_COMMUNITY): Payer: Self-pay | Admitting: Emergency Medicine

## 2020-11-09 ENCOUNTER — Other Ambulatory Visit: Payer: Self-pay

## 2020-11-09 ENCOUNTER — Inpatient Hospital Stay (HOSPITAL_COMMUNITY)
Admission: EM | Admit: 2020-11-09 | Discharge: 2020-11-16 | DRG: 682 | Disposition: A | Discharge status: Skilled Nursing Facility | Payer: Medicare Other | Attending: Internal Medicine | Admitting: Internal Medicine

## 2020-11-09 DIAGNOSIS — Z7901 Long term (current) use of anticoagulants: Secondary | ICD-10-CM | POA: Diagnosis not present

## 2020-11-09 DIAGNOSIS — M7989 Other specified soft tissue disorders: Secondary | ICD-10-CM | POA: Diagnosis not present

## 2020-11-09 DIAGNOSIS — Z7401 Bed confinement status: Secondary | ICD-10-CM | POA: Diagnosis not present

## 2020-11-09 DIAGNOSIS — M21332 Wrist drop, left wrist: Secondary | ICD-10-CM | POA: Diagnosis present

## 2020-11-09 DIAGNOSIS — R0902 Hypoxemia: Secondary | ICD-10-CM | POA: Diagnosis not present

## 2020-11-09 DIAGNOSIS — L89310 Pressure ulcer of right buttock, unstageable: Secondary | ICD-10-CM | POA: Diagnosis present

## 2020-11-09 DIAGNOSIS — R52 Pain, unspecified: Secondary | ICD-10-CM

## 2020-11-09 DIAGNOSIS — R531 Weakness: Secondary | ICD-10-CM | POA: Diagnosis not present

## 2020-11-09 DIAGNOSIS — U071 COVID-19: Secondary | ICD-10-CM

## 2020-11-09 DIAGNOSIS — R600 Localized edema: Secondary | ICD-10-CM | POA: Diagnosis present

## 2020-11-09 DIAGNOSIS — I420 Dilated cardiomyopathy: Secondary | ICD-10-CM | POA: Diagnosis present

## 2020-11-09 DIAGNOSIS — R339 Retention of urine, unspecified: Secondary | ICD-10-CM | POA: Diagnosis present

## 2020-11-09 DIAGNOSIS — N390 Urinary tract infection, site not specified: Secondary | ICD-10-CM | POA: Diagnosis present

## 2020-11-09 DIAGNOSIS — R262 Difficulty in walking, not elsewhere classified: Secondary | ICD-10-CM | POA: Diagnosis not present

## 2020-11-09 DIAGNOSIS — N1832 Chronic kidney disease, stage 3b: Secondary | ICD-10-CM | POA: Diagnosis present

## 2020-11-09 DIAGNOSIS — N183 Chronic kidney disease, stage 3 unspecified: Secondary | ICD-10-CM

## 2020-11-09 DIAGNOSIS — J321 Chronic frontal sinusitis: Secondary | ICD-10-CM | POA: Diagnosis not present

## 2020-11-09 DIAGNOSIS — T1490XA Injury, unspecified, initial encounter: Secondary | ICD-10-CM | POA: Diagnosis not present

## 2020-11-09 DIAGNOSIS — R269 Unspecified abnormalities of gait and mobility: Secondary | ICD-10-CM | POA: Diagnosis present

## 2020-11-09 DIAGNOSIS — I129 Hypertensive chronic kidney disease with stage 1 through stage 4 chronic kidney disease, or unspecified chronic kidney disease: Secondary | ICD-10-CM | POA: Diagnosis not present

## 2020-11-09 DIAGNOSIS — R778 Other specified abnormalities of plasma proteins: Secondary | ICD-10-CM

## 2020-11-09 DIAGNOSIS — I5022 Chronic systolic (congestive) heart failure: Secondary | ICD-10-CM | POA: Diagnosis present

## 2020-11-09 DIAGNOSIS — S8991XA Unspecified injury of right lower leg, initial encounter: Secondary | ICD-10-CM | POA: Diagnosis not present

## 2020-11-09 DIAGNOSIS — I4819 Other persistent atrial fibrillation: Secondary | ICD-10-CM | POA: Diagnosis present

## 2020-11-09 DIAGNOSIS — E872 Acidosis: Secondary | ICD-10-CM | POA: Diagnosis present

## 2020-11-09 DIAGNOSIS — I672 Cerebral atherosclerosis: Secondary | ICD-10-CM | POA: Diagnosis not present

## 2020-11-09 DIAGNOSIS — Z806 Family history of leukemia: Secondary | ICD-10-CM | POA: Diagnosis not present

## 2020-11-09 DIAGNOSIS — R410 Disorientation, unspecified: Secondary | ICD-10-CM | POA: Diagnosis not present

## 2020-11-09 DIAGNOSIS — R609 Edema, unspecified: Secondary | ICD-10-CM

## 2020-11-09 DIAGNOSIS — R279 Unspecified lack of coordination: Secondary | ICD-10-CM | POA: Diagnosis not present

## 2020-11-09 DIAGNOSIS — M2550 Pain in unspecified joint: Secondary | ICD-10-CM | POA: Diagnosis not present

## 2020-11-09 DIAGNOSIS — E870 Hyperosmolality and hypernatremia: Secondary | ICD-10-CM

## 2020-11-09 DIAGNOSIS — Z95828 Presence of other vascular implants and grafts: Secondary | ICD-10-CM

## 2020-11-09 DIAGNOSIS — R7401 Elevation of levels of liver transaminase levels: Secondary | ICD-10-CM | POA: Diagnosis present

## 2020-11-09 DIAGNOSIS — R000 Tachycardia, unspecified: Secondary | ICD-10-CM | POA: Diagnosis not present

## 2020-11-09 DIAGNOSIS — S79911A Unspecified injury of right hip, initial encounter: Secondary | ICD-10-CM | POA: Diagnosis not present

## 2020-11-09 DIAGNOSIS — I4891 Unspecified atrial fibrillation: Secondary | ICD-10-CM | POA: Diagnosis present

## 2020-11-09 DIAGNOSIS — M109 Gout, unspecified: Secondary | ICD-10-CM | POA: Diagnosis present

## 2020-11-09 DIAGNOSIS — Z8673 Personal history of transient ischemic attack (TIA), and cerebral infarction without residual deficits: Secondary | ICD-10-CM | POA: Diagnosis not present

## 2020-11-09 DIAGNOSIS — N179 Acute kidney failure, unspecified: Principal | ICD-10-CM | POA: Diagnosis present

## 2020-11-09 DIAGNOSIS — R404 Transient alteration of awareness: Secondary | ICD-10-CM | POA: Diagnosis not present

## 2020-11-09 DIAGNOSIS — R5381 Other malaise: Secondary | ICD-10-CM | POA: Diagnosis not present

## 2020-11-09 DIAGNOSIS — E861 Hypovolemia: Secondary | ICD-10-CM | POA: Diagnosis present

## 2020-11-09 DIAGNOSIS — I6501 Occlusion and stenosis of right vertebral artery: Secondary | ICD-10-CM | POA: Diagnosis not present

## 2020-11-09 DIAGNOSIS — M25561 Pain in right knee: Secondary | ICD-10-CM | POA: Diagnosis present

## 2020-11-09 DIAGNOSIS — I130 Hypertensive heart and chronic kidney disease with heart failure and stage 1 through stage 4 chronic kidney disease, or unspecified chronic kidney disease: Secondary | ICD-10-CM | POA: Diagnosis not present

## 2020-11-09 DIAGNOSIS — Z79899 Other long term (current) drug therapy: Secondary | ICD-10-CM

## 2020-11-09 DIAGNOSIS — M1711 Unilateral primary osteoarthritis, right knee: Secondary | ICD-10-CM | POA: Diagnosis not present

## 2020-11-09 DIAGNOSIS — E875 Hyperkalemia: Secondary | ICD-10-CM | POA: Diagnosis not present

## 2020-11-09 DIAGNOSIS — Z043 Encounter for examination and observation following other accident: Secondary | ICD-10-CM | POA: Diagnosis not present

## 2020-11-09 DIAGNOSIS — R41841 Cognitive communication deficit: Secondary | ICD-10-CM | POA: Diagnosis not present

## 2020-11-09 DIAGNOSIS — G9341 Metabolic encephalopathy: Secondary | ICD-10-CM | POA: Diagnosis present

## 2020-11-09 DIAGNOSIS — R2681 Unsteadiness on feet: Secondary | ICD-10-CM | POA: Diagnosis not present

## 2020-11-09 DIAGNOSIS — R4182 Altered mental status, unspecified: Secondary | ICD-10-CM

## 2020-11-09 DIAGNOSIS — E860 Dehydration: Secondary | ICD-10-CM | POA: Diagnosis present

## 2020-11-09 DIAGNOSIS — I6613 Occlusion and stenosis of bilateral anterior cerebral arteries: Secondary | ICD-10-CM | POA: Diagnosis not present

## 2020-11-09 DIAGNOSIS — I509 Heart failure, unspecified: Secondary | ICD-10-CM | POA: Diagnosis not present

## 2020-11-09 DIAGNOSIS — M6281 Muscle weakness (generalized): Secondary | ICD-10-CM | POA: Diagnosis not present

## 2020-11-09 DIAGNOSIS — J9811 Atelectasis: Secondary | ICD-10-CM | POA: Diagnosis not present

## 2020-11-09 DIAGNOSIS — M8618 Other acute osteomyelitis, other site: Secondary | ICD-10-CM | POA: Diagnosis not present

## 2020-11-09 DIAGNOSIS — R1312 Dysphagia, oropharyngeal phase: Secondary | ICD-10-CM | POA: Diagnosis not present

## 2020-11-09 DIAGNOSIS — I63521 Cerebral infarction due to unspecified occlusion or stenosis of right anterior cerebral artery: Secondary | ICD-10-CM | POA: Diagnosis not present

## 2020-11-09 DIAGNOSIS — N1330 Unspecified hydronephrosis: Secondary | ICD-10-CM | POA: Diagnosis not present

## 2020-11-09 LAB — DIFFERENTIAL
Abs Immature Granulocytes: 0.06 10*3/uL (ref 0.00–0.07)
Basophils Absolute: 0 10*3/uL (ref 0.0–0.1)
Basophils Relative: 0 %
Eosinophils Absolute: 0 10*3/uL (ref 0.0–0.5)
Eosinophils Relative: 0 %
Immature Granulocytes: 1 %
Lymphocytes Relative: 14 %
Lymphs Abs: 1.5 10*3/uL (ref 0.7–4.0)
Monocytes Absolute: 0.8 10*3/uL (ref 0.1–1.0)
Monocytes Relative: 7 %
Neutro Abs: 8.2 10*3/uL — ABNORMAL HIGH (ref 1.7–7.7)
Neutrophils Relative %: 78 %

## 2020-11-09 LAB — CBC
HCT: 56.9 % — ABNORMAL HIGH (ref 39.0–52.0)
Hemoglobin: 17 g/dL (ref 13.0–17.0)
MCH: 27.4 pg (ref 26.0–34.0)
MCHC: 29.9 g/dL — ABNORMAL LOW (ref 30.0–36.0)
MCV: 91.8 fL (ref 80.0–100.0)
Platelets: 397 10*3/uL (ref 150–400)
RBC: 6.2 MIL/uL — ABNORMAL HIGH (ref 4.22–5.81)
RDW: 17.8 % — ABNORMAL HIGH (ref 11.5–15.5)
WBC: 10.5 10*3/uL (ref 4.0–10.5)
nRBC: 0 % (ref 0.0–0.2)

## 2020-11-09 LAB — URINALYSIS, ROUTINE W REFLEX MICROSCOPIC
Bilirubin Urine: NEGATIVE
Glucose, UA: NEGATIVE mg/dL
Ketones, ur: NEGATIVE mg/dL
Nitrite: NEGATIVE
Protein, ur: NEGATIVE mg/dL
Specific Gravity, Urine: 1.02 (ref 1.005–1.030)
pH: 5 (ref 5.0–8.0)

## 2020-11-09 LAB — COMPREHENSIVE METABOLIC PANEL
ALT: 28 U/L (ref 0–44)
AST: 32 U/L (ref 15–41)
Albumin: 2.9 g/dL — ABNORMAL LOW (ref 3.5–5.0)
Alkaline Phosphatase: 72 U/L (ref 38–126)
Anion gap: 19 — ABNORMAL HIGH (ref 5–15)
BUN: 101 mg/dL — ABNORMAL HIGH (ref 8–23)
CO2: 21 mmol/L — ABNORMAL LOW (ref 22–32)
Calcium: 9.7 mg/dL (ref 8.9–10.3)
Chloride: 128 mmol/L — ABNORMAL HIGH (ref 98–111)
Creatinine, Ser: 3.1 mg/dL — ABNORMAL HIGH (ref 0.61–1.24)
GFR, Estimated: 21 mL/min — ABNORMAL LOW (ref >60)
Glucose, Bld: 157 mg/dL — ABNORMAL HIGH (ref 70–99)
Potassium: 5.4 mmol/L — ABNORMAL HIGH (ref 3.5–5.1)
Sodium: 168 mmol/L (ref 135–145)
Total Bilirubin: 2.5 mg/dL — ABNORMAL HIGH (ref 0.3–1.2)
Total Protein: 7.1 g/dL (ref 6.5–8.1)

## 2020-11-09 LAB — I-STAT BETA HCG BLOOD, ED (MC, WL, AP ONLY): I-stat hCG, quantitative: <5 m[IU]/mL (ref <5)

## 2020-11-09 LAB — APTT: aPTT: 36 seconds (ref 24–36)

## 2020-11-09 LAB — PROTIME-INR
INR: 1.4 — ABNORMAL HIGH (ref 0.8–1.2)
Prothrombin Time: 16.5 seconds — ABNORMAL HIGH (ref 11.4–15.2)

## 2020-11-09 LAB — RESP PANEL BY RT-PCR (FLU A&B, COVID) ARPGX2
Influenza A by PCR: NEGATIVE
Influenza B by PCR: NEGATIVE
SARS Coronavirus 2 by RT PCR: POSITIVE — AB

## 2020-11-09 LAB — CK: Total CK: 185 U/L (ref 49–397)

## 2020-11-09 LAB — CBG MONITORING, ED: Glucose-Capillary: 73 mg/dL (ref 70–99)

## 2020-11-09 LAB — TROPONIN I (HIGH SENSITIVITY): Troponin I (High Sensitivity): 59 ng/L — ABNORMAL HIGH (ref <18)

## 2020-11-09 LAB — LACTIC ACID, PLASMA: Lactic Acid, Venous: 2.4 mmol/L (ref 0.5–1.9)

## 2020-11-09 MED ORDER — AMIODARONE HCL IN DEXTROSE 360-4.14 MG/200ML-% IV SOLN
60.0000 mg/h | INTRAVENOUS | Status: AC
  Administered 2020-11-10 (×2): 60 mg/h via INTRAVENOUS
  Filled 2020-11-09 (×2): qty 200

## 2020-11-09 MED ORDER — SODIUM CHLORIDE 0.9 % IV SOLN
100.0000 mg | Freq: Every day | INTRAVENOUS | Status: AC
  Administered 2020-11-10 – 2020-11-13 (×4): 100 mg via INTRAVENOUS
  Filled 2020-11-09 (×4): qty 20

## 2020-11-09 MED ORDER — SODIUM CHLORIDE 0.9 % IV BOLUS
1000.0000 mL | Freq: Once | INTRAVENOUS | Status: AC
  Administered 2020-11-09: 1000 mL via INTRAVENOUS

## 2020-11-09 MED ORDER — AMIODARONE LOAD VIA INFUSION
150.0000 mg | Freq: Once | INTRAVENOUS | Status: AC
  Administered 2020-11-10: 150 mg via INTRAVENOUS
  Filled 2020-11-09: qty 83.34

## 2020-11-09 MED ORDER — SODIUM CHLORIDE 0.9 % IV SOLN
200.0000 mg | Freq: Once | INTRAVENOUS | Status: AC
  Administered 2020-11-10: 200 mg via INTRAVENOUS
  Filled 2020-11-09: qty 40

## 2020-11-09 MED ORDER — SODIUM CHLORIDE 0.9% FLUSH: 3.0000 mL | Freq: Once | INTRAVENOUS | Status: DC

## 2020-11-09 MED ORDER — DILTIAZEM HCL-DEXTROSE 125-5 MG/125ML-% IV SOLN (PREMIX)
5.0000 mg/h | INTRAVENOUS | Status: DC
  Administered 2020-11-09: 5 mg/h via INTRAVENOUS
  Filled 2020-11-09: qty 125

## 2020-11-09 MED ORDER — HEPARIN (PORCINE) 25000 UT/250ML-% IV SOLN
1600.0000 [IU]/h | INTRAVENOUS | Status: DC
  Administered 2020-11-10: 1350 [IU]/h via INTRAVENOUS
  Administered 2020-11-10: 1400 [IU]/h via INTRAVENOUS
  Filled 2020-11-09 (×3): qty 250

## 2020-11-09 MED ORDER — IOHEXOL 350 MG/ML SOLN
100.0000 mL | Freq: Once | INTRAVENOUS | Status: AC | PRN
  Administered 2020-11-09: 100 mL via INTRAVENOUS

## 2020-11-09 MED ORDER — AMIODARONE HCL IN DEXTROSE 360-4.14 MG/200ML-% IV SOLN
30.0000 mg/h | INTRAVENOUS | Status: DC
  Administered 2020-11-10 – 2020-11-11 (×3): 30 mg/h via INTRAVENOUS
  Filled 2020-11-09 (×4): qty 200

## 2020-11-09 NOTE — Consult Note (Signed)
Neurology Consultation  Reason for Consult: Code stroke Referring Physician: Dr. Rodena Medin  CC: Left-sided weakness, inability to talk, found on the ground with last known normal 1 week ago  History is obtained from: EMS  HPI: Jeffery Christian is a 70 y.o. male past medical history of persistent atrial fibrillation on Eliquis-last dose unknown, dilated cardiomyopathy, chronic systolic CHF, CKD 3 at baseline based on last labs, last known well about 1 week ago when he was heard from by friends, who went to check on him today because they are not heard from him and upon getting no response at his door, knocked the door down and found him down on the ground between a dresser in the bed. He was not talking at the time.  He was weak all over but the EMTs noted him to be weaker on the left in comparison to the right and called the ER for suggestions on whether to activate a code stroke given the unclear last known well.  ED provider instructed them to activate a code stroke and he was brought into the emergency room via EMS lights and sirens. He was unable to provide any history. He was able to follow simple commands He had a strong smell of urine and EMTs reported urine and stool around him when they picked him up from his house.  Chart review reveals that he has had some episodes of passing out in December 2021.  No follow-up.  In the emergency room, he was tachycardic with heart rate fluctuating in the low 100s to 180 in atrial fibrillation.  His blood pressures were in systolic 140s 150s.  He was breathing well and saturating normally at room air and protecting his airway.  Detailed neurological examination below  LKW: Unclear-per friend report, 7 days ago, also Eliquis-last dose unknown. tpa given?: no, outside the window Premorbid modified Rankin scale (mRS): Patient unable to provide.  No family member at bedside  ROS: Unable to ascertain due to his mentation  Past Medical History:  Diagnosis Date   . Chronic systolic CHF (congestive heart failure) (HCC)    EF 35-40, diffuse HK, mild MR, moderate LAE, mild RAE, PASP 44, L pleural eff  . Dilated cardiomyopathy (HCC)    likely related to tachycardia  . Persistent atrial fibrillation (HCC)     Family History  Problem Relation Age of Onset  . Leukemia Father        10  . Cancer Mother      Social History:   reports that he has never smoked. He has never used smokeless tobacco. He reports current alcohol use. He reports that he does not use drugs.  Medications  Current Facility-Administered Medications:  .  sodium chloride 0.9 % bolus 1,000 mL, 1,000 mL, Intravenous, Once, Messick, Peter C, MD .  sodium chloride flush (NS) 0.9 % injection 3 mL, 3 mL, Intravenous, Once, Messick, Noralyn Pick, MD  Current Outpatient Medications:  .  allopurinol (ZYLOPRIM) 300 MG tablet, Take 1 tablet (300 mg total) by mouth 2 (two) times daily., Disp: 60 tablet, Rfl: 6 .  apixaban (ELIQUIS) 5 MG TABS tablet, Take 1 tablet (5 mg total) by mouth 2 (two) times daily., Disp: 60 tablet, Rfl: 6 .  atorvastatin (LIPITOR) 20 MG tablet, Take 1 tablet (20 mg total) by mouth daily., Disp: 30 tablet, Rfl: 6 .  ciprofloxacin (CIPRO) 500 MG tablet, Take 1 tablet (500 mg total) by mouth 2 (two) times daily. (Patient not taking: Reported on 09/08/2020), Disp:  6 tablet, Rfl: 0 .  colchicine 0.6 MG tablet, Take 2 tabs (1.2 mg) at the onset of a gout flare and repeat 1 tab (0.6 mg) in 1 hour if symptoms persist, Disp: 30 tablet, Rfl: 6 .  furosemide (LASIX) 20 MG tablet, Take 1 tablet (20 mg total) by mouth daily., Disp: 30 tablet, Rfl: 6 .  metoprolol succinate (TOPROL-XL) 25 MG 24 hr tablet, Take 1 tablet (25 mg total) by mouth daily. Take with or immediately following a meal., Disp: 30 tablet, Rfl: 6 .  predniSONE (DELTASONE) 20 MG tablet, 2 tabs daily x 2 days, then 1.5 tabs daily x 2 days then 1 tab PO daily x 2 days (Patient not taking: Reported on 09/08/2020), Disp: 9  tablet, Rfl: 0   Exam: Current vital signs: BP (!) 159/134 (BP Location: Right Arm)   Pulse (!) 190   Temp (!) 96.4 F (35.8 C) (Temporal)   Resp 20   SpO2 98%  Vital signs in last 24 hours: Temp:  [96.4 F (35.8 C)] 96.4 F (35.8 C) (02/08 2206) Pulse Rate:  [190] 190 (02/08 2206) Resp:  [20] 20 (02/08 2206) BP: (159)/(134) 159/134 (02/08 2206) SpO2:  [98 %] 98 % (02/08 2206) General: Awake alert in no distress HEENT: Normocephalic atraumatic Lungs: Clear Cardiovascular: Irregularly irregular rhythm Abdomen nondistended nontender Extremities: Legs warm well perfused, there is a big ulcer on the left buttock. Neurological exam He is awake, alert. He tracks the examiner Follow simple commands such as opening and closing eyes and sticking the tongue out.  He is also able to count the fingers when tested visual fields on both sides. His speech is extremely hypophonic but is able to mumble some words. Cranial nerves: Pupils equal round react light, extraocular movements intact, visual fields full, face appears symmetric. Motor exam: He is weak in all 4 extremities and can barely go antigravity in any of the 4 but I did not appreciate too much of a focality on this exam. Sensory exam: Grimaces to noxious stimulation in all fours Coordination difficult to assess due to his mentation and weakness NIH stroke scale 1a Level of Conscious.: 0 1b LOC Questions: 2 1c LOC Commands: 0 2 Best Gaze: 0 3 Visual: 0 4 Facial Palsy: 0 5a Motor Arm - left: 2 5b Motor Arm - Right: 2 6a Motor Leg - Left: 2 6b Motor Leg - Right: 2 7 Limb Ataxia: 0 8 Sensory: 0 9 Best Language: 1 10 Dysarthria: 0 11 Extinct. and Inatten.: 0 TOTAL: 11   Labs I have reviewed labs in epic and the results pertinent to this consultation are:  CBC    Component Value Date/Time   WBC 9.3 08/05/2017 0508   RBC 5.48 08/05/2017 0508   HGB 17.7 (H) 08/20/2017 0817   HCT 52.0 08/20/2017 0817   PLT 241  08/05/2017 0508   MCV 86.3 08/05/2017 0508   MCH 28.5 08/05/2017 0508   MCHC 33.0 08/05/2017 0508   RDW 14.5 08/05/2017 0508   LYMPHSABS 1.3 08/05/2017 0508   MONOABS 0.7 08/05/2017 0508   EOSABS 0.0 08/05/2017 0508   BASOSABS 0.0 08/05/2017 0508    CMP     Component Value Date/Time   NA 146 (H) 06/08/2020 1645   K 4.6 06/08/2020 1645   CL 110 (H) 06/08/2020 1645   CO2 22 06/08/2020 1645   GLUCOSE 184 (H) 06/08/2020 1645   GLUCOSE 111 (H) 08/20/2017 0817   BUN 13 06/08/2020 1645   CREATININE 1.48 (  H) 06/08/2020 1645   CALCIUM 9.6 06/08/2020 1645   PROT 6.6 06/08/2020 1645   ALBUMIN 3.9 06/08/2020 1645   AST 11 06/08/2020 1645   ALT 14 06/08/2020 1645   ALKPHOS 101 06/08/2020 1645   BILITOT 0.4 06/08/2020 1645   GFRNONAA 48 (L) 06/08/2020 1645   GFRAA 56 (L) 06/08/2020 1645    Lipid Panel     Component Value Date/Time   CHOL 139 10/28/2018 1014   TRIG 174 (H) 10/28/2018 1014   HDL 39 (L) 10/28/2018 1014   CHOLHDL 3.6 10/28/2018 1014   LDLCALC 65 10/28/2018 1014    Preliminary i-STAT read with sodium 169, BUN greater than 130  Imaging I have reviewed the images obtained:  CT-scan of the brain-remote-looking bilateral ACA infarcts.  Remote lacunar infarct in the left carotid.  No acute intracranial hemorrhage.  No mass.  Aspects 10 CTA head and neck with no emergent LVO.  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 CT perfusion with a right frontal penumbra of 18 cc.  Assessment:  70 year old man with above past medical history, with last known normal 7 days ago, found down and unable to move all of his extremities with EMTs noting left-sided weakness more profound than the right-sided weakness on site-brought in as a code stroke. No clear history available-patient unable to provide as well as no family member at bedside. CT head with bilateral ACA strokes-chronic appearing in addition to lacunar infarcts also chronic  appearing. CTA head and neck with no emergent LVO-did show a 2 occlusions bilaterally-likely chronic given the hypodensity on the CT head as well. I suspect that his current clinical presentation is secondary to a systemic process such as rhabdomyolysis/AKI, rather than a stroke explaining his current lab and imaging and clinical picture. Stroke is not out of the realm of possibility given persistent atrial fibrillation-unknown when he last took his anticoagulant-he is supposed to be on Eliquis at home.  Impression: Multifactorial toxic metabolic encephalopathy Evaluate for rhabdomyolysis, AKI on CKD. Evaluate for stroke Afib w/RVR Left gluteal pressure ulcer  Recommendations: Check CBC CMP MRI brain without contrast Management of toxic metabolic derangements as they appear on the labs Management of A. fib with RVR per ER. Check urinalysis, ammonia Get a routine EEG in the morning although he does not have any history of seizures, there was a note in the chart saying that he has been passing out in the morning-given his cardiac history likely cardiogenic syncope but it would be prudent to rule out an electrographic brain abnormality. Maintain seizure precautions Cancel code stroke-is outside the window for any intervention. For the next few hours, continue every 2 hours neurochecks and after that every 4 hours neurochecks.  Case discussed in detail with Dr. Rodena Medin, EDP.  -- Milon Dikes, MD Neurologist [facility] Pager: [phone]    ADDENDUM Labs show that he is COVID +ve CMP:     Component Value Date/Time   NA 168 (HH) 11/09/2020 2112   NA 146 (H) 06/08/2020 1645   K 5.4 (H) 11/09/2020 2112   CL 128 (H) 11/09/2020 2112   CO2 21 (L) 11/09/2020 2112   GLUCOSE 157 (H) 11/09/2020 2112   BUN 101 (H) 11/09/2020 2112   BUN 13 06/08/2020 1645   CREATININE 3.10 (H) 11/09/2020 2112   CALCIUM 9.7 11/09/2020 2112   PROT 7.1 11/09/2020 2112   PROT 6.6 06/08/2020  1645   ALBUMIN 2.9 (L) 11/09/2020 2112   ALBUMIN 3.9 06/08/2020  1645   AST 32 11/09/2020 2112   ALT 28 11/09/2020 2112   ALKPHOS 72 11/09/2020 2112   BILITOT 2.5 (H) 11/09/2020 2112   BILITOT 0.4 06/08/2020 1645   GFRNONAA 21 (L) 11/09/2020 2112   GFRAA 56 (L) 06/08/2020 1645   Recommendations as above. Medical management of the above derangements, MRI brain and routine EEG. Neurology will follow.  -- Milon Dikes, MD Neurologist [facility] Pager: [phone]     CRITICAL CARE ATTESTATION Performed by: Milon Dikes, MD Total critical care time: 40 minutes Critical care time was exclusive of separately billable procedures and treating other patients and/or supervising APPs/Residents/Students Critical care was necessary to treat or prevent imminent or life-threatening deterioration due to  Multifactorial toxic metabolic encephalopathy, possible rhabdomyolysis, strokelike symptoms, A. fib with RVR This patient is critically ill and at significant risk for neurological worsening and/or death and care requires constant monitoring. Critical care was time spent personally by me on the following activities: development of treatment plan with patient and/or surrogate as well as nursing, discussions with consultants, evaluation of patient's response to treatment, examination of patient, obtaining history from patient or surrogate, ordering and performing treatments and interventions, ordering and review of laboratory studies, ordering and review of radiographic studies, pulse oximetry, re-evaluation of patient's condition, participation in multidisciplinary rounds and medical decision making of high complexity in the care of this patient.

## 2020-11-09 NOTE — H&P (Signed)
History and Physical    Jeffery Christian ZOX:[id number] DOB: 1951-09-08 DOA: 11/09/2020  PCP: Hoy Register, MD   Patient coming from: home  Chief Complaint: AMS  HPI: Jeffery Christian is a 70 y.o. male with medical history significant for atrial fibrillation on Eliquis-last dose unknown, dilated cardiomyopathy, chronic systolic CHF, CKD 3 who presents by EMS after being found on the floor at home by his friends.  He was last seen about 1 week ago by his friends.  His friends not heard from him in the last week so they went to his house to check on him and upon getting no response at his door, knocked the door down and found him down on the ground between a dresser in the bed.  He was not talking at the time.  He was weak all over but the EMTs noted him to be weaker on the left in comparison to the right and brought to ER as a code stroke.  He was unable to provide any history. He was able to follow simple commands He had a strong smell of urine and EMTs reported urine and stool around him when they picked him up from his house.  ED Course: He was found to have elevated heart rate in the 1 60-1 80 range.  EKG showed atrial fibrillation with RVR.  He was started on Cardizem infusion in the emergency room but continues to have elevated heart rate.  CT the head was negative for acute stroke.  Neurology was consulted and has evaluated patient.  Patient was found to be hypernatremic on labs with sodium of 168. Troponin was elevated at 56  Review of Systems:  Review of systems not obtainable secondary to altered mental status  Past Medical History:  Diagnosis Date  . Chronic systolic CHF (congestive heart failure) (HCC)    EF 35-40, diffuse HK, mild MR, moderate LAE, mild RAE, PASP 44, L pleural eff  . Dilated cardiomyopathy (HCC)    likely related to tachycardia  . Persistent atrial fibrillation [facility])     Past Surgical History:  Procedure Laterality Date  . CARDIOVERSION N/A 08/20/2017   Procedure:  CARDIOVERSION;  Surgeon: [name], MD;  Location: [facility] ENDOSCOPY;  Service: Cardiovascular;  Laterality: N/A;  . INCISION AND DRAINAGE ABSCESS Left 03/22/2014   Procedure: INCISION AND DRAINAGE ABSCESS LEFT BUTTOCK ABSCESS;  Surgeon: [name], MD;  Location: [facility] OR;  Service: General;  Laterality: Left;    Social History  reports that he has never smoked. He has never used smokeless tobacco. He reports current alcohol use. He reports that he does not use drugs.  No Known Allergies  Family History  Problem Relation Age of Onset  . Leukemia Father        36  . Cancer Mother      Prior to Admission medications   Medication Sig Start Date End Date Taking? Authorizing Provider  allopurinol (ZYLOPRIM) 300 MG tablet Take 1 tablet (300 mg total) by mouth 2 (two) times daily. 06/08/20   Hoy Register, MD  apixaban (ELIQUIS) 5 MG TABS tablet Take 1 tablet (5 mg total) by mouth 2 (two) times daily. 06/08/20   Hoy Register, MD  atorvastatin (LIPITOR) 20 MG tablet Take 1 tablet (20 mg total) by mouth daily. 06/08/20   Hoy Register, MD  ciprofloxacin (CIPRO) 500 MG tablet Take 1 tablet (500 mg total) by mouth 2 (two) times daily. Patient not taking: Reported on 09/08/2020 06/08/20   Hoy Register, MD  colchicine 0.6 MG tablet Take 2 tabs (1.2 mg) at the onset of a gout flare and repeat 1 tab (0.6 mg) in 1 hour if symptoms persist 06/08/20   Hoy Register, MD  furosemide (LASIX) 20 MG tablet Take 1 tablet (20 mg total) by mouth daily. 06/08/20   Hoy Register, MD  metoprolol succinate (TOPROL-XL) 25 MG 24 hr tablet Take 1 tablet (25 mg total) by mouth daily. Take with or immediately following a meal. 06/08/20 09/06/20  Hoy Register, MD  predniSONE (DELTASONE) 20 MG tablet 2 tabs daily x 2 days, then 1.5 tabs daily x 2 days then 1 tab PO daily x 2 days Patient not taking: Reported on 09/08/2020 07/16/20   Marcine Matar, MD    Physical Exam: Vitals:   11/09/20 2215 11/09/20 2230  11/09/20 2245 11/09/20 2300  BP: (!) 142/100 (!) 149/124 (!) 157/138 (!) 155/144  Pulse: 64 (!) 34 (!) 168   Resp:   20 (!) 21  Temp:      TempSrc:      SpO2: 98% 100% 100%   Weight:      Height:        Constitutional: NAD, calm, comfortable Vitals:   11/09/20 2215 11/09/20 2230 11/09/20 2245 11/09/20 2300  BP: (!) 142/100 (!) 149/124 (!) 157/138 (!) 155/144  Pulse: 64 (!) 34 (!) 168   Resp:   20 (!) 21  Temp:      TempSrc:      SpO2: 98% 100% 100%   Weight:      Height:       General: WDWN, Alert. Tracks with eyes. Does not verbally respond. Eyes: PERRL, conjunctivae normal.  Sclera nonicteric. No drainage HENT:  [place]/AT, external ears normal.  Nares patent without epistasis.  Mucous membranes are dry Neck: Soft, normal passive range of motion, supple, no masses, no thyromegaly. Trachea midline Respiratory: clear to auscultation bilaterally, no wheezing, no crackles. Normal respiratory effort. No accessory muscle use.  Cardiovascular: Irregularly irregular rhythm with tachycardia., no murmurs / rubs / gallops. No extremity edema. 1+ pedal pulses. Abdomen: Soft, no tenderness, nondistended, no rebound or guarding.  No masses palpated. Bowel sounds normoactive Musculoskeletal: Normal passive range of motion of extremities.  No cyanosis. No joint deformity upper and lower extremities. Normal muscle tone.  Skin: Warm, dry, intact no rashes, lesions. No induration Neurologic: Grimaces and withdraws from painful stimuli.  Babinski downgoing bilaterally.  Patella DTR trace bilaterally. Strength 2/5 in all extremities.     Labs on Admission: I have personally reviewed following labs and imaging studies  CBC: Recent Labs  Lab 11/09/20 2112  WBC 10.5  NEUTROABS 8.2*  HGB 17.0  HCT 56.9*  MCV 91.8  PLT 397    Basic Metabolic Panel: Recent Labs  Lab 11/09/20 2112  NA 168*  K 5.4*  CL 128*  CO2 21*  GLUCOSE 157*  BUN 101*  CREATININE 3.10*  CALCIUM 9.7     GFR: Estimated Creatinine Clearance: 27.7 mL/min (A) (by C-G formula based on SCr of 3.1 mg/dL (H)).  Liver Function Tests: Recent Labs  Lab 11/09/20 2112  AST 32  ALT 28  ALKPHOS 72  BILITOT 2.5*  PROT 7.1  ALBUMIN 2.9*    Urine analysis:    Component Value Date/Time   COLORURINE AMBER (A) 11/09/2020 2239   APPEARANCEUR HAZY (A) 11/09/2020 2239   LABSPEC 1.020 11/09/2020 2239   PHURINE 5.0 11/09/2020 2239   GLUCOSEU NEGATIVE 11/09/2020 2239   HGBUR SMALL (A)  11/09/2020 2239   BILIRUBINUR NEGATIVE 11/09/2020 2239   BILIRUBINUR negative 06/08/2020 1632   KETONESUR NEGATIVE 11/09/2020 2239   PROTEINUR NEGATIVE 11/09/2020 2239   UROBILINOGEN 0.2 06/08/2020 1632   NITRITE NEGATIVE 11/09/2020 2239   LEUKOCYTESUR SMALL (A) 11/09/2020 2239    Radiological Exams on Admission: CT Code Stroke CTA Head W/WO contrast  Result Date: 11/09/2020 CLINICAL DATA:  Initial evaluation for acute stroke, left-sided weakness. EXAM: CT ANGIOGRAPHY HEAD AND NECK CT PERFUSION BRAIN TECHNIQUE: Multidetector CT imaging of the head and neck was performed using the standard protocol during bolus administration of intravenous contrast. Multiplanar CT image reconstructions and MIPs were obtained to evaluate the vascular anatomy. Carotid stenosis measurements (when applicable) are obtained utilizing NASCET criteria, using the distal internal carotid diameter as the denominator. Multiphase CT imaging of the brain was performed following IV bolus contrast injection. Subsequent parametric perfusion maps were calculated using RAPID software. CONTRAST:  100 cc of Omnipaque 350. COMPARISON:  Prior head CT from earlier the same day. FINDINGS: CTA NECK FINDINGS Aortic arch: Visualized aortic arch of normal caliber with normal branch pattern. No hemodynamically significant stenosis about the origin of the great vessels. Visualized subclavian arteries widely patent. Right carotid system: Right common and internal  carotid arteries widely patent without stenosis, dissection or occlusion. Left carotid system: Left CCA patent from its origin to the bifurcation without stenosis. Mild eccentric soft plaque at the origin of the left ICA without significant stenosis. Left ICA patent distally without stenosis, dissection or occlusion. Vertebral arteries: Both vertebral arteries arise from the subclavian arteries. Dominant left vertebral artery with a diffusely hypoplastic right vertebral artery. Left vertebral artery widely patent within the neck without stenosis or other abnormality. Severe stenosis at the origin of the right vertebral artery. Hypoplastic right vertebral artery otherwise patent within the neck without stenosis or other abnormality. Skeleton: No visible acute osseous abnormality, although cervical spine better evaluated on concomitant CT of the cervical spine. No discrete or worrisome osseous lesions. Other neck: No other acute soft tissue abnormality within the neck. No mass or adenopathy. Upper chest: Scattered atelectatic changes noted within the visualized lungs. Few scattered superimposed tree-in-bud nodular densities within the peripheral right upper lobe could reflect changes of acute bronchiolitis and/or mucoid impaction. No frank airspace consolidation. Review of the MIP images confirms the above findings CTA HEAD FINDINGS Anterior circulation: Petrous, cavernous, and supraclinoid segments of both internal carotid arteries are widely patent without stenosis. A1 segments patent bilaterally. Normal anterior communicating artery. 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 Finding presumably chronic in nature given the chronic bilateral ACA territory infarcts seen on prior noncontrast head CT. Scant distal reconstitution, likely collateral. M1 segments patent bilaterally. Normal MCA bifurcations. Distal MCA branches well perfused and symmetric. Posterior circulation: Dominant left vertebral artery  widely patent to the vertebrobasilar junction. Left PICA patent. Hypoplastic right vertebral artery grossly patent to the vertebrobasilar junction as well. Right PICA not visualized. Basilar patent to its distal aspect without stenosis. Superior cerebellar arteries patent bilaterally. Fetal type origin of the left PCA. Right PCA supplied via the basilar as well as a robust right posterior communicating artery. Scattered atheromatous irregularity within both PCAs without high-grade stenosis. Short-segment mild right P2 stenosis noted (series 10, image 19). Venous sinuses: Not well assessed due to timing of the contrast bolus. Anatomic variants: Fetal type origin of the left PCA. Hypoplastic right vertebral artery. 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 Review of the MIP images confirms the above findings CT Brain Perfusion  Findings: ASPECTS: 10 CBF (<30%) Volume: 56mL Perfusion (Tmax>6.0s) volume: 61mL Mismatch Volume: 7mL Infarction Location:Negative CT perfusion for acute ischemia. Delayed perfusion seen within the right ACA distribution, in keeping with the chronic ACA occlusion. IMPRESSION: CTA HEAD AND NECK IMPRESSION: 1. Negative CTA for emergent large vessel occlusion. 2. 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 3. Scattered mild for age atheromatous change elsewhere about the major arterial vasculature of the head and neck. No other hemodynamically significant or correctable stenosis. 4. Few scattered tree-in-bud nodular densities within right upper lobe, nonspecific, but could reflect changes of acute bronchiolitis and/or mucoid impaction. No frank airspace consolidation. CT PERFUSION IMPRESSION: 1. Negative CT perfusion for acute ischemia. 2. 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 Critical Value/emergent results were called by telephone at the time of  interpretation on 11/09/2020 at 9:45 pm to provider [facility] , who verbally acknowledged these results. Electronically Signed   By: Rise Mu M.D.   On: 11/09/2020 22:11   DG Pelvis 1-2 Views  Result Date: 11/09/2020 CLINICAL DATA:  Fall EXAM: PELVIS - 1-2 VIEW COMPARISON:  CT 03/22/2014 FINDINGS: Bones of the pelvis appear intact and congruent. Proximal femora intact and normally located within the acetabula. Moderate degenerative changes noted throughout the lower lumbar spine, bilateral SI joints, and bilateral hips. Some mild left hip soft tissue swelling is present. Finding on a background of diffuse mild body wall edema. Some hyperdense likely excreted contrast media seen within the urinary bladder. Slightly lobular, crenulated bladder contours and possible diverticular outpouchings, can be seen in the setting chronic outlet obstruction particularly in this patient with a history of prostatomegaly seen on comparison pelvic CT. No other acute or suspicious soft tissue abnormalities. IMPRESSION: 1. Mild left hip soft tissue swelling without acute fracture or traumatic malalignment. 2. Moderate degenerative changes throughout the spine, bilateral SI joints and hips. 3. Bladder opacified by excreted contrast media. Slightly lobular, crenulated bladder contours and possible diverticular outpouchings, can be seen in the setting of chronic outlet obstruction particularly in this patient with a history of prostatomegaly. Electronically Signed   By: Kreg Shropshire M.D.   On: 11/09/2020 22:32   CT Code Stroke CTA Neck W/WO contrast  Result Date: 11/09/2020 CLINICAL DATA:  Initial evaluation for acute stroke, left-sided weakness. EXAM: CT ANGIOGRAPHY HEAD AND NECK CT PERFUSION BRAIN TECHNIQUE: Multidetector CT imaging of the head and neck was performed using the standard protocol during bolus administration of intravenous contrast. Multiplanar CT image reconstructions and MIPs were obtained to evaluate  the vascular anatomy. Carotid stenosis measurements (when applicable) are obtained utilizing NASCET criteria, using the distal internal carotid diameter as the denominator. Multiphase CT imaging of the brain was performed following IV bolus contrast injection. Subsequent parametric perfusion maps were calculated using RAPID software. CONTRAST:  100 cc of Omnipaque 350. COMPARISON:  Prior head CT from earlier the same day. FINDINGS: CTA NECK FINDINGS Aortic arch: Visualized aortic arch of normal caliber with normal branch pattern. No hemodynamically significant stenosis about the origin of the great vessels. Visualized subclavian arteries widely patent. Right carotid system: Right common and internal carotid arteries widely patent without stenosis, dissection or occlusion. Left carotid system: Left CCA patent from its origin to the bifurcation without stenosis. Mild eccentric soft plaque at the origin of the left ICA without significant stenosis. Left ICA patent distally without stenosis, dissection or occlusion.  Vertebral arteries: Both vertebral arteries arise from the subclavian arteries. Dominant left vertebral artery with a diffusely hypoplastic right vertebral artery. Left vertebral artery widely patent within the neck without stenosis or other abnormality. Severe stenosis at the origin of the right vertebral artery. Hypoplastic right vertebral artery otherwise patent within the neck without stenosis or other abnormality. Skeleton: No visible acute osseous abnormality, although cervical spine better evaluated on concomitant CT of the cervical spine. No discrete or worrisome osseous lesions. Other neck: No other acute soft tissue abnormality within the neck. No mass or adenopathy. Upper chest: Scattered atelectatic changes noted within the visualized lungs. Few scattered superimposed tree-in-bud nodular densities within the peripheral right upper lobe could reflect changes of acute bronchiolitis and/or mucoid  impaction. No frank airspace consolidation. Review of the MIP images confirms the above findings CTA HEAD FINDINGS Anterior circulation: Petrous, cavernous, and supraclinoid segments of both internal carotid arteries are widely patent without stenosis. A1 segments patent bilaterally. Normal anterior communicating artery. 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 Finding presumably chronic in nature given the chronic bilateral ACA territory infarcts seen on prior noncontrast head CT. Scant distal reconstitution, likely collateral. M1 segments patent bilaterally. Normal MCA bifurcations. Distal MCA branches well perfused and symmetric. Posterior circulation: Dominant left vertebral artery widely patent to the vertebrobasilar junction. Left PICA patent. Hypoplastic right vertebral artery grossly patent to the vertebrobasilar junction as well. Right PICA not visualized. Basilar patent to its distal aspect without stenosis. Superior cerebellar arteries patent bilaterally. Fetal type origin of the left PCA. Right PCA supplied via the basilar as well as a robust right posterior communicating artery. Scattered atheromatous irregularity within both PCAs without high-grade stenosis. Short-segment mild right P2 stenosis noted (series 10, image 19). Venous sinuses: Not well assessed due to timing of the contrast bolus. Anatomic variants: Fetal type origin of the left PCA. Hypoplastic right vertebral artery. 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 Review of the MIP images confirms the above findings CT Brain Perfusion Findings: ASPECTS: 10 CBF (<30%) Volume: 66mL Perfusion (Tmax>6.0s) volume: 55mL Mismatch Volume: 34mL Infarction Location:Negative CT perfusion for acute ischemia. Delayed perfusion seen within the right ACA distribution, in keeping with the chronic ACA occlusion. IMPRESSION: CTA HEAD AND NECK IMPRESSION: 1. Negative CTA for emergent large vessel occlusion. 2. Chronically occluded azygos ACA, in keeping with the  previously identified chronic bilateral ACA territory infarcts. 3. Scattered mild for age atheromatous change elsewhere about the major arterial vasculature of the head and neck. No other hemodynamically significant or correctable stenosis. 4. Few scattered tree-in-bud nodular densities within right upper lobe, nonspecific, but could reflect changes of acute bronchiolitis and/or mucoid impaction. No frank airspace consolidation. CT PERFUSION IMPRESSION: 1. Negative CT perfusion for acute ischemia. 2. 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 Critical Value/emergent results were called by telephone at the time of interpretation on 11/09/2020 at 9:45 pm to provider [facility] , who verbally acknowledged these results. Electronically Signed   By: Rise Mu M.D.   On: 11/09/2020 22:11   CT C-SPINE NO CHARGE  Result Date: 11/09/2020 CLINICAL DATA:  Left-sided weakness EXAM: CT CERVICAL SPINE WITHOUT CONTRAST TECHNIQUE: Multidetector CT imaging of the cervical spine was performed without intravenous contrast. Multiplanar CT image reconstructions were also generated. COMPARISON:  None. FINDINGS: Alignment: Alignment is anatomic. Skull base and vertebrae: No acute fracture. No primary bone lesion or focal pathologic process. Soft tissues and spinal canal: Please refer  to separately reported CT angiography of the neck for description of vascular findings. No prevertebral fluid or swelling. No visible canal hematoma. Disc levels: Bridging anterior osteophytes are seen throughout the entire cervical spine. Mild disc osteophyte complex at C4-5. No significant central canal or neural foraminal encroachment. Upper chest: Airway is patent. Lung apices are clear. Other: Reconstructed images confirm the above findings. IMPRESSION: 1. No acute cervical spine fracture. 2. Diffuse cervical spondylosis. No significant neural  foraminal or central canal stenosis. Electronically Signed   By: Sharlet Salina M.D.   On: 11/09/2020 22:02   CT Code Stroke Cerebral Perfusion with contrast  Result Date: 11/09/2020 CLINICAL DATA:  Initial evaluation for acute stroke, left-sided weakness. EXAM: CT ANGIOGRAPHY HEAD AND NECK CT PERFUSION BRAIN TECHNIQUE: Multidetector CT imaging of the head and neck was performed using the standard protocol during bolus administration of intravenous contrast. Multiplanar CT image reconstructions and MIPs were obtained to evaluate the vascular anatomy. Carotid stenosis measurements (when applicable) are obtained utilizing NASCET criteria, using the distal internal carotid diameter as the denominator. Multiphase CT imaging of the brain was performed following IV bolus contrast injection. Subsequent parametric perfusion maps were calculated using RAPID software. CONTRAST:  100 cc of Omnipaque 350. COMPARISON:  Prior head CT from earlier the same day. FINDINGS: CTA NECK FINDINGS Aortic arch: Visualized aortic arch of normal caliber with normal branch pattern. No hemodynamically significant stenosis about the origin of the great vessels. Visualized subclavian arteries widely patent. Right carotid system: Right common and internal carotid arteries widely patent without stenosis, dissection or occlusion. Left carotid system: Left CCA patent from its origin to the bifurcation without stenosis. Mild eccentric soft plaque at the origin of the left ICA without significant stenosis. Left ICA patent distally without stenosis, dissection or occlusion. Vertebral arteries: Both vertebral arteries arise from the subclavian arteries. Dominant left vertebral artery with a diffusely hypoplastic right vertebral artery. Left vertebral artery widely patent within the neck without stenosis or other abnormality. Severe stenosis at the origin of the right vertebral artery. Hypoplastic right vertebral artery otherwise patent within the neck  without stenosis or other abnormality. Skeleton: No visible acute osseous abnormality, although cervical spine better evaluated on concomitant CT of the cervical spine. No discrete or worrisome osseous lesions. Other neck: No other acute soft tissue abnormality within the neck. No mass or adenopathy. Upper chest: Scattered atelectatic changes noted within the visualized lungs. Few scattered superimposed tree-in-bud nodular densities within the peripheral right upper lobe could reflect changes of acute bronchiolitis and/or mucoid impaction. No frank airspace consolidation. Review of the MIP images confirms the above findings CTA HEAD FINDINGS Anterior circulation: Petrous, cavernous, and supraclinoid segments of both internal carotid arteries are widely patent without stenosis. A1 segments patent bilaterally. Normal anterior communicating artery. 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 Finding presumably chronic in nature given the chronic bilateral ACA territory infarcts seen on prior noncontrast head CT. Scant distal reconstitution, likely collateral. M1 segments patent bilaterally. Normal MCA bifurcations. Distal MCA branches well perfused and symmetric. Posterior circulation: Dominant left vertebral artery widely patent to the vertebrobasilar junction. Left PICA patent. Hypoplastic right vertebral artery grossly patent to the vertebrobasilar junction as well. Right PICA not visualized. Basilar patent to its distal aspect without stenosis. Superior cerebellar arteries patent bilaterally. Fetal type origin of the left PCA. Right PCA supplied via the basilar as well as a robust right posterior communicating artery. Scattered atheromatous irregularity within both PCAs without high-grade stenosis. Short-segment mild right  P2 stenosis noted (series 10, image 19). Venous sinuses: Not well assessed due to timing of the contrast bolus. Anatomic variants: Fetal type origin of the left PCA. Hypoplastic right  vertebral artery. 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 Review of the MIP images confirms the above findings CT Brain Perfusion Findings: ASPECTS: 10 CBF (<30%) Volume: 1mL Perfusion (Tmax>6.0s) volume: 64mL Mismatch Volume: 81mL Infarction Location:Negative CT perfusion for acute ischemia. Delayed perfusion seen within the right ACA distribution, in keeping with the chronic ACA occlusion. IMPRESSION: CTA HEAD AND NECK IMPRESSION: 1. Negative CTA for emergent large vessel occlusion. 2. 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 3. Scattered mild for age atheromatous change elsewhere about the major arterial vasculature of the head and neck. No other hemodynamically significant or correctable stenosis. 4. Few scattered tree-in-bud nodular densities within right upper lobe, nonspecific, but could reflect changes of acute bronchiolitis and/or mucoid impaction. No frank airspace consolidation. CT PERFUSION IMPRESSION: 1. Negative CT perfusion for acute ischemia. 2. 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 Critical Value/emergent results were called by telephone at the time of interpretation on 11/09/2020 at 9:45 pm to provider [facility] , who verbally acknowledged these results. Electronically Signed   By: Rise Mu M.D.   On: 11/09/2020 22:11   DG Chest Port 1 View  Result Date: 11/09/2020 CLINICAL DATA:  Fall EXAM: PORTABLE CHEST 1 VIEW COMPARISON:  None. FINDINGS: The heart size and mediastinal contours are within normal limits. Both lungs are clear. The visualized skeletal structures are unremarkable. IMPRESSION: No active disease. Electronically Signed   By: Jonna Clark M.D.   On: 11/09/2020 22:38   CT HEAD CODE STROKE WO CONTRAST  Result Date: 11/09/2020 CLINICAL DATA:  Code stroke. Initial evaluation for acute left-sided weakness, stroke  suspected. EXAM: CT HEAD WITHOUT CONTRAST TECHNIQUE: Contiguous axial images were obtained from the base of the skull through the vertex without intravenous contrast. COMPARISON:  None available. FINDINGS: Brain: Age-related cerebral atrophy with chronic small vessel ischemic disease. Remote lacunar infarct present at the left caudate. Remote bilateral ACA territory infarcts noted. No acute intracranial hemorrhage. No acute large vessel territory infarct. No mass lesion, mass effect or midline shift. Mild ventricular prominence related to global parenchymal volume loss without hydrocephalus. No extra-axial fluid collection. Vascular: No hyperdense vessel. Skull: Scalp soft tissues demonstrate no acute finding. Small lipoma noted at the posterior scalp. Calvarium intact. Sinuses/Orbits: Globes and orbital soft tissues within normal limits. Chronic frontal sinusitis noted. Scattered mucosal thickening noted elsewhere within the sphenoid ethmoidal sinuses. No mastoid effusion. Other: None. ASPECTS [facility] Stroke Program Early CT Score) - Ganglionic level infarction (caudate, lentiform nuclei, internal capsule, insula, M1-M3 cortex): 7 - Supraganglionic infarction (M4-M6 cortex): 3 Total score (0-10 with 10 being normal): 10 IMPRESSION: 1. No acute intracranial infarct or other abnormality. 2. ASPECTS is 10. 3. Remote bilateral ACA territory infarcts, with additional remote lacunar infarct at the left basal ganglia. 4. Underlying atrophy with chronic small vessel ischemic disease. These results were communicated to Dr. Wilford Corner at 9:31 pmon 2/8/2022by text page via the [facility] messaging system. Electronically Signed   By: Rise Mu M.D.   On: 11/09/2020 21:51    EKG: Independently reviewed.  EKG shows atrial fibrillation with RVR with no acute ST elevation or depression.  QTc 490  Assessment/Plan Principal Problem:   Atrial fibrillation with RVR  Mr. Minney is admitted  to progressive care unit. He was  placed on Cardizem infusion in the emergency room but continued to run a heart rate in the 150-160 with atrial fibrillation.  Patient was converted to amiodarone infusion.  I discussed this with the on-call cardiology fellow who recommended that if amiodarone does not help with A. fib with RVR to give him a call and cardiology will consult at that time. Obtain echocardiogram in the morning. Serial troponin levels. Active Problems:   Acute metabolic encephalopathy CT head is negative for acute pathology. No large vessel occulsion on CTA head. Pt with old infarcts on CT head.  Chronic Azygos ACA occlusions noted.  MRI head is ordered and pending.  Neurology consulted and has evaluated pt. Appreciate Dr. Bess HarvestArora's input.     Hypernatremia   Acute renal failure superimposed on stage 3 chronic kidney disease  Increased creatinine over baseline. Nephrology consulted and appreciate the assistance with pt care.  Nephrology recommends isotonic IV fluids as these will still be lower sodium content than his current serum level.  Monitor electrolytes closely    Elevated troponin Check serial troponin levels. If troponin elevates will consult cardiology.     COVID-19 virus infection Sarted on remdesivir Supplemental oxygen provided keep O2 sat between 92 to 96% Albuterol MDI every 4 hours as needed for wheezing cough, shortness of breath Incentive spirometer every 1-2 hours while awake     DVT prophylaxis: Heparin infusion for anticoagulation with a-fib Code Status:   Full Code  Family Communication:  No family at bedside Disposition Plan:   Patient is from:  Home  Anticipated DC to:  To be determined  Anticipated DC date:  Anticipate more than 2 midnight stay in hospital to treat acute condition    Consults called:  Neurology-Dr. Wilford CornerArora;  Nephrology-Dr. Thedore MinsSingh Admission status:  Inpatient   Claudean SeveranceBradley S Arria Naim MD [facility]  How to contact the [facility] Attending or Consulting provider 7A  - 7P or covering provider during after hours 7P -7A, for this patient?   1. Check the care team in [facility] and look for a) attending/consulting [facility] provider listed and b) the [facility] team listed 2. Log into [email or web address] and use [place]'s universal password to access. If you do not have the password, please contact the hospital operator. 3. Locate the [facility] provider you are looking for under [facility] and page to a number that you can be directly reached. 4. If you still have difficulty reaching the provider, please page the [facility] (Director on Call) for the Hospitalists listed on amion for assistance.  11/09/2020, 11:45 PM

## 2020-11-09 NOTE — Progress Notes (Signed)
ANTICOAGULATION CONSULT NOTE - Initial Consult  Pharmacy Consult for heparin Indication: atrial fibrillation  No Known Allergies  Patient Measurements: Height: 6' (182.9 cm) Weight: 98.7 kg (217 lb 9.5 oz) IBW/kg (Calculated) : 77.6 Heparin Dosing Weight: 97.5 kg  Vital Signs: Temp: 96.4 F (35.8 C) (02/08 2206) Temp Source: Temporal (02/08 2206) BP: 155/144 (02/08 2300) Pulse Rate: 168 (02/08 2245)  Labs: Recent Labs    11/09/20 2112 11/09/20 2242  HGB 17.0  --   HCT 56.9*  --   PLT 397  --   APTT  --  36  LABPROT  --  16.5*  INR  --  1.4*  CREATININE 3.10*  --   CKTOTAL 185  --   TROPONINIHS 59*  --     Estimated Creatinine Clearance: 27.7 mL/min (A) (by C-G formula based on SCr of 3.1 mg/dL (H)).   Medical History: Past Medical History:  Diagnosis Date  . Chronic systolic CHF (congestive heart failure) (HCC)    EF 35-40, diffuse HK, mild MR, moderate LAE, mild RAE, PASP 44, L pleural eff  . Dilated cardiomyopathy (HCC)    likely related to tachycardia  . Persistent atrial fibrillation (HCC)     Medications:  See mediation history  Assessment: 70 yo man on eliquis PTA to start heparin.  Last dose eliquis unknown.  Hg 17, PTLC 397 Goal of Therapy:  Heparin level 0.3-0.7 units/ml aPTT 66-102 seconds Monitor platelets by anticoagulation protocol: Yes   Plan:  Start heparin drip at 1350 units/hr with no bolus.  Check heparin level and aPTT 6-8 hours after start Monitor for bleeding complications  Talbert Cage P[place]et 11/09/2020,11:52 PM

## 2020-11-09 NOTE — ED Notes (Signed)
Date and time results received: 11/09/20   Test:COVID Critical Value: POSITIVE  Name of Provider Notified: MESSICK

## 2020-11-09 NOTE — ED Notes (Signed)
Spoke with pt daughter [place] and updated her on pt status at this time. States pt does live alone at home.

## 2020-11-09 NOTE — ED Provider Notes (Addendum)
[facility] EMERGENCY DEPARTMENT Provider Note   CSN: [id number] Arrival date & time: 11/09/20  2110  An emergency department physician performed an initial assessment on this suspected stroke patient at 2110.  History Chief Complaint  Patient presents with  . Code Stroke    Jeffery Christian is a 70 y.o. male.  70 year old male with prior medical history as detailed below presents for evaluation.  Patient arrives by EMS from his residence.  Patient was last seen 1 week prior.  Patient apparently found today on the floor.  He was supine.  He was in between the wall and a dresser.  Patient is unable to verbally answer questions.  Level 5 caveat secondary to AMS.  EMS activated code stroke in the field.  Patient is outside the window for TPA administration.  The history is provided by the patient and medical records.  Illness Location:  AMS Severity:  Severe Onset quality:  Unable to specify Timing:  Unable to specify Progression:  Unable to specify Chronicity:  New      Past Medical History:  Diagnosis Date  . Chronic systolic CHF (congestive heart failure) (HCC)    EF 35-40, diffuse HK, mild MR, moderate LAE, mild RAE, PASP 44, L pleural eff  . Dilated cardiomyopathy (HCC)    likely related to tachycardia  . Persistent atrial fibrillation [facility])     Patient Active Problem List   Diagnosis Date Noted  . Hypertension 01/07/2018  . Atrial fibrillation with RVR (HCC) 08/05/2017  . CKD (chronic kidney disease), stage III (HCC) 08/05/2017  . Persistent atrial fibrillation (HCC)   . Chronic systolic CHF (congestive heart failure) (HCC)   . Dilated cardiomyopathy (HCC)   . Shortness of breath   . Gout 07/18/2017  . Left buttock abscess 03/22/2014    Past Surgical History:  Procedure Laterality Date  . CARDIOVERSION N/A 08/20/2017   Procedure: CARDIOVERSION;  Surgeon: [name], MD;  Location: [facility] ENDOSCOPY;  Service: Cardiovascular;  Laterality: N/A;  .  INCISION AND DRAINAGE ABSCESS Left 03/22/2014   Procedure: INCISION AND DRAINAGE ABSCESS LEFT BUTTOCK ABSCESS;  Surgeon: [name], MD;  Location: [facility] OR;  Service: General;  Laterality: Left;       Family History  Problem Relation Age of Onset  . Leukemia Father        26  . Cancer Mother     Social History   Tobacco Use  . Smoking status: Never Smoker  . Smokeless tobacco: Never Used  Substance Use Topics  . Alcohol use: Yes    Comment: "pt states he "does not drink a lot anymore"  . Drug use: No    Home Medications Prior to Admission medications   Medication Sig Start Date End Date Taking? Authorizing Provider  allopurinol (ZYLOPRIM) 300 MG tablet Take 1 tablet (300 mg total) by mouth 2 (two) times daily. 06/08/20   Hoy Register, MD  apixaban (ELIQUIS) 5 MG TABS tablet Take 1 tablet (5 mg total) by mouth 2 (two) times daily. 06/08/20   Hoy Register, MD  atorvastatin (LIPITOR) 20 MG tablet Take 1 tablet (20 mg total) by mouth daily. 06/08/20   Hoy Register, MD  ciprofloxacin (CIPRO) 500 MG tablet Take 1 tablet (500 mg total) by mouth 2 (two) times daily. Patient not taking: Reported on 09/08/2020 06/08/20   Hoy Register, MD  colchicine 0.6 MG tablet Take 2 tabs (1.2 mg) at the onset of a gout flare and repeat 1 tab (0.6 mg)  in 1 hour if symptoms persist 06/08/20   Hoy Register, MD  furosemide (LASIX) 20 MG tablet Take 1 tablet (20 mg total) by mouth daily. 06/08/20   Hoy Register, MD  metoprolol succinate (TOPROL-XL) 25 MG 24 hr tablet Take 1 tablet (25 mg total) by mouth daily. Take with or immediately following a meal. 06/08/20 09/06/20  Hoy Register, MD  predniSONE (DELTASONE) 20 MG tablet 2 tabs daily x 2 days, then 1.5 tabs daily x 2 days then 1 tab PO daily x 2 days Patient not taking: Reported on 09/08/2020 07/16/20   Marcine Matar, MD    Allergies    Patient has no known allergies.  Review of Systems   Review of Systems  Unable to perform ROS:  Acuity of condition    Physical Exam Updated Vital Signs BP (!) 159/134 (BP Location: Right Arm)   Pulse (!) 190   Temp (!) 96.4 F (35.8 C) (Temporal)   Resp 20   Ht 6' (1.829 m)   Wt 98.7 kg   SpO2 98%   BMI 29.51 kg/m   Physical Exam Vitals and nursing note reviewed.  Constitutional:      General: He is not in acute distress.    Appearance: He is well-developed and well-nourished.  HENT:     Head: Normocephalic and atraumatic.     Mouth/Throat:     Mouth: Oropharynx is clear and moist. Mucous membranes are dry.  Eyes:     Extraocular Movements: EOM normal.     Conjunctiva/sclera: Conjunctivae normal.     Pupils: Pupils are equal, round, and reactive to light.  Cardiovascular:     Rate and Rhythm: Normal rate and regular rhythm.     Heart sounds: Normal heart sounds.  Pulmonary:     Effort: Pulmonary effort is normal. No respiratory distress.     Breath sounds: Normal breath sounds.  Abdominal:     General: There is no distension.     Palpations: Abdomen is soft.     Tenderness: There is no abdominal tenderness.  Musculoskeletal:        General: No deformity or edema. Normal range of motion.     Cervical back: Normal range of motion and neck supple.  Skin:    General: Skin is warm and dry.  Neurological:     Mental Status: He is alert.     Comments: Alert, follows simple verbal commands  Globally weak  Nonverbal   Psychiatric:        Mood and Affect: Mood and affect normal.     ED Results / Procedures / Treatments   Labs (all labs ordered are listed, but only abnormal results are displayed) Labs Reviewed  RESP PANEL BY RT-PCR (FLU A&B, COVID) ARPGX2 - Abnormal; Notable for the following components:      Result Value   SARS Coronavirus 2 by RT PCR POSITIVE (*)    All other components within normal limits  CBC - Abnormal; Notable for the following components:   RBC 6.20 (*)    HCT 56.9 (*)    MCHC 29.9 (*)    RDW 17.8 (*)    All other components  within normal limits  DIFFERENTIAL - Abnormal; Notable for the following components:   Neutro Abs 8.2 (*)    All other components within normal limits  COMPREHENSIVE METABOLIC PANEL - Abnormal; Notable for the following components:   Sodium 168 (*)    Potassium 5.4 (*)    Chloride 128 (*)  CO2 21 (*)    Glucose, Bld 157 (*)    BUN 101 (*)    Creatinine, Ser 3.10 (*)    Albumin 2.9 (*)    Total Bilirubin 2.5 (*)    GFR, Estimated 21 (*)    Anion gap 19 (*)    All other components within normal limits  LACTIC ACID, PLASMA - Abnormal; Notable for the following components:   Lactic Acid, Venous 2.4 (*)    All other components within normal limits  PROTIME-INR - Abnormal; Notable for the following components:   Prothrombin Time 16.5 (*)    INR 1.4 (*)    All other components within normal limits  URINALYSIS, ROUTINE W REFLEX MICROSCOPIC - Abnormal; Notable for the following components:   Color, Urine AMBER (*)    APPearance HAZY (*)    Hgb urine dipstick SMALL (*)    Leukocytes,Ua SMALL (*)    Bacteria, UA MANY (*)    All other components within normal limits  TROPONIN I (HIGH SENSITIVITY) - Abnormal; Notable for the following components:   Troponin I (High Sensitivity) 59 (*)    All other components within normal limits  CK  APTT  LACTIC ACID, PLASMA  I-STAT CHEM 8, ED  CBG MONITORING, ED  I-STAT BETA HCG BLOOD, ED ([facility], [facility], [facility] ONLY)  CBG MONITORING, ED  TROPONIN I (HIGH SENSITIVITY)    EKG EKG Interpretation  Date/Time:  Tuesday November 09 2020 21:46:25 EST Ventricular Rate:  177 PR Interval:    QRS Duration: 65 QT Interval:  285 QTC Calculation: 490 R Axis:   69 Text Interpretation: Atrial fibrillation with rapid V-rate Borderline low voltage, extremity leads Abnormal R-wave progression, early transition Repolarization abnormality, prob rate related Confirmed by Kristine Royal [phone]) on 11/09/2020 9:48:31 PM   Radiology CT HEAD CODE STROKE WO  CONTRAST  Result Date: 11/09/2020 CLINICAL DATA:  Code stroke. Initial evaluation for acute left-sided weakness, stroke suspected. EXAM: CT HEAD WITHOUT CONTRAST TECHNIQUE: Contiguous axial images were obtained from the base of the skull through the vertex without intravenous contrast. COMPARISON:  None available. FINDINGS: Brain: Age-related cerebral atrophy with chronic small vessel ischemic disease. Remote lacunar infarct present at the left caudate. Remote bilateral ACA territory infarcts noted. No acute intracranial hemorrhage. No acute large vessel territory infarct. No mass lesion, mass effect or midline shift. Mild ventricular prominence related to global parenchymal volume loss without hydrocephalus. No extra-axial fluid collection. Vascular: No hyperdense vessel. Skull: Scalp soft tissues demonstrate no acute finding. Small lipoma noted at the posterior scalp. Calvarium intact. Sinuses/Orbits: Globes and orbital soft tissues within normal limits. Chronic frontal sinusitis noted. Scattered mucosal thickening noted elsewhere within the sphenoid ethmoidal sinuses. No mastoid effusion. Other: None. ASPECTS [facility] Stroke Program Early CT Score) - Ganglionic level infarction (caudate, lentiform nuclei, internal capsule, insula, M1-M3 cortex): 7 - Supraganglionic infarction (M4-M6 cortex): 3 Total score (0-10 with 10 being normal): 10 IMPRESSION: 1. No acute intracranial infarct or other abnormality. 2. ASPECTS is 10. 3. Remote bilateral ACA territory infarcts, with additional remote lacunar infarct at the left basal ganglia. 4. Underlying atrophy with chronic small vessel ischemic disease. These results were communicated to Dr. Wilford Corner at 9:31 pmon 2/8/2022by text page via the [facility] messaging system. Electronically Signed   By: Rise Mu M.D.   On: 11/09/2020 21:51    Procedures Procedures  CRITICAL CARE Performed by: Wynetta Fines   Total critical care time: 30 minutes  Critical care  time was exclusive of  separately billable procedures and treating other patients.  Critical care was necessary to treat or prevent imminent or life-threatening deterioration.  Critical care was time spent personally by me on the following activities: development of treatment plan with patient and/or surrogate as well as nursing, discussions with consultants, evaluation of patient's response to treatment, examination of patient, obtaining history from patient or surrogate, ordering and performing treatments and interventions, ordering and review of laboratory studies, ordering and review of radiographic studies, pulse oximetry and re-evaluation of patient's condition.   Medications Ordered in ED Medications  sodium chloride flush (NS) 0.9 % injection 3 mL ( Intravenous Canceled Entry 11/09/20 2219)  diltiazem (CARDIZEM) 125 mg in dextrose 5% 125 mL (1 mg/mL) infusion (has no administration in time range)  sodium chloride 0.9 % bolus 1,000 mL (1,000 mLs Intravenous New Bag/Given 11/09/20 2220)  iohexol (OMNIPAQUE) 350 MG/ML injection 100 mL (100 mLs Intravenous Contrast Given 11/09/20 2137)  sodium chloride 0.9 % bolus 1,000 mL (1,000 mLs Intravenous New Bag/Given 11/09/20 2220)    ED Course  I have reviewed the triage vital signs and the nursing notes.  Pertinent labs & imaging results that were available during my care of the patient were reviewed by me and considered in my medical decision making (see chart for details).    MDM Rules/Calculators/A&P                          MDM  Screen complete  Jeffery Christian was evaluated in Emergency Department on 11/09/2020 for the symptoms described in the history of present illness. He was evaluated in the context of the global COVID-19 pandemic, which necessitated consideration that the patient might be at risk for infection with the SARS-CoV-2 virus that causes COVID-19. Institutional protocols and algorithms that pertain to the evaluation of patients at risk  for COVID-19 are in a state of rapid change based on information released by regulatory bodies including the CDC and federal and state organizations. These policies and algorithms were followed during the patient's care in the ED.  Patient is presenting for reported altered mental status.  Patient last seen normal 1 week prior to arrival.  Clinically patient appears to be significantly dehydrated and possible rhabdomyolysis.  Neuro team has seen the patient and deemed him to not be a candidate for TPA.   Patient found to be in A. fib with RVR.  Covid screen is positive.  Patient with significant hypernatremia and AKI.  Will require admission for further work-up and treatment.  Hospitalist services Optometrist) is aware of case and will evaluate for same.  Nephrology Thedore Mins)  is aware of consult.    Final Clinical Impression(s) / ED Diagnoses Final diagnoses:  Altered mental status, unspecified altered mental status type  Atrial fibrillation, unspecified type (HCC)  AKI (acute kidney injury) [facility])    Rx / DC Orders ED Discharge Orders    None       Wynetta Fines, MD 11/09/20 2315    Wynetta Fines, MD 11/09/20 2320

## 2020-11-09 NOTE — Code Documentation (Signed)
Paged for code stroke  LKW 1 week ago on eliquis.  Patient was found down by friend after not seeing him for 1 week. Patient has diffuse weakness.  EMS reported L sided weakness and no speech.    CBG 73  CT neg for acute CVA or bleed.

## 2020-11-09 NOTE — ED Triage Notes (Signed)
Pt brought to ED by [facility] from home as a code stroke, LSW  A week ago by friend, pt found on the floor with left side weakness, no able to talk and incontinence. Pt is Alert on arrival, but unable to assess orientation. BP 140/90, HR 160, SPO2 98 CBG 137

## 2020-11-09 NOTE — H&P (Incomplete)
History and Physical    Jeffery Christian BZJ:[id number] DOB: 06/20/1951 DOA: 11/09/2020  PCP: Hoy Register, MD   Patient coming from: home  Chief Complaint: AMS  HPI: Jeffery Christian is a 70 y.o. male with medical history significant for atrial fibrillation on Eliquis-last dose unknown, dilated cardiomyopathy, chronic systolic CHF, CKD 3 who presents by EMS after being found on the floor at home by his friends.  He was last seen about 1 week ago by his friends.  His friends not heard from him in the last week so they went to his house to check on him and upon getting no response at his door, knocked the door down and found him down on the ground between a dresser in the bed.  He was not talking at the time.  He was weak all over but the EMTs noted him to be weaker on the left in comparison to the right and called the ER for suggestions on whether to activate a code stroke given the unclear last known well.  ED provider instructed them to activate a code stroke and he was brought into the emergency room via EMS lights and sirens. He was unable to provide any history. He was able to follow simple commands He had a strong smell of urine and EMTs reported urine and stool around him when they picked him up from his house.  Chart review reveals that he has had some episodes of passing out in December 2021.  No follow-up.  In the emergency room, he was tachycardic with heart rate fluctuating in the low 100s to 180 in atrial fibrillation.  His blood pressures were in systolic 140s 150s.  He was breathing well and saturating normally at room air and protecting his airway.  Detailed neurological examination below   ED Course: ***  Review of Systems:  Review of systems not obtainable secondary to altered mental status  Past Medical History:  Diagnosis Date  . Chronic systolic CHF (congestive heart failure) (HCC)    EF 35-40, diffuse HK, mild MR, moderate LAE, mild RAE, PASP 44, L pleural eff  . Dilated  cardiomyopathy (HCC)    likely related to tachycardia  . Persistent atrial fibrillation [facility])     Past Surgical History:  Procedure Laterality Date  . CARDIOVERSION N/A 08/20/2017   Procedure: CARDIOVERSION;  Surgeon: [name], MD;  Location: [facility] ENDOSCOPY;  Service: Cardiovascular;  Laterality: N/A;  . INCISION AND DRAINAGE ABSCESS Left 03/22/2014   Procedure: INCISION AND DRAINAGE ABSCESS LEFT BUTTOCK ABSCESS;  Surgeon: [name], MD;  Location: [facility] OR;  Service: General;  Laterality: Left;    Social History  reports that he has never smoked. He has never used smokeless tobacco. He reports current alcohol use. He reports that he does not use drugs.  No Known Allergies  Family History  Problem Relation Age of Onset  . Leukemia Father        75  . Cancer Mother      Prior to Admission medications   Medication Sig Start Date End Date Taking? Authorizing Provider  allopurinol (ZYLOPRIM) 300 MG tablet Take 1 tablet (300 mg total) by mouth 2 (two) times daily. 06/08/20   Hoy Register, MD  apixaban (ELIQUIS) 5 MG TABS tablet Take 1 tablet (5 mg total) by mouth 2 (two) times daily. 06/08/20   Hoy Register, MD  atorvastatin (LIPITOR) 20 MG tablet Take 1 tablet (20 mg total) by mouth daily. 06/08/20   Hoy Register, MD  ciprofloxacin (CIPRO) 500 MG tablet Take 1 tablet (500 mg total) by mouth 2 (two) times daily. Patient not taking: Reported on 09/08/2020 06/08/20   Hoy RegisterNewlin, Enobong, MD  colchicine 0.6 MG tablet Take 2 tabs (1.2 mg) at the onset of a gout flare and repeat 1 tab (0.6 mg) in 1 hour if symptoms persist 06/08/20   Hoy RegisterNewlin, Enobong, MD  furosemide (LASIX) 20 MG tablet Take 1 tablet (20 mg total) by mouth daily. 06/08/20   Hoy RegisterNewlin, Enobong, MD  metoprolol succinate (TOPROL-XL) 25 MG 24 hr tablet Take 1 tablet (25 mg total) by mouth daily. Take with or immediately following a meal. 06/08/20 09/06/20  Hoy RegisterNewlin, Enobong, MD  predniSONE (DELTASONE) 20 MG tablet 2 tabs daily x 2  days, then 1.5 tabs daily x 2 days then 1 tab PO daily x 2 days Patient not taking: Reported on 09/08/2020 07/16/20   Marcine MatarJohnson, Deborah B, MD    Physical Exam: Vitals:   11/09/20 2215 11/09/20 2230 11/09/20 2245 11/09/20 2300  BP: (!) 142/100 (!) 149/124 (!) 157/138 (!) 155/144  Pulse: 64 (!) 34 (!) 168   Resp:   20 (!) 21  Temp:      TempSrc:      SpO2: 98% 100% 100%   Weight:      Height:        Constitutional: NAD, calm, comfortable Vitals:   11/09/20 2215 11/09/20 2230 11/09/20 2245 11/09/20 2300  BP: (!) 142/100 (!) 149/124 (!) 157/138 (!) 155/144  Pulse: 64 (!) 34 (!) 168   Resp:   20 (!) 21  Temp:      TempSrc:      SpO2: 98% 100% 100%   Weight:      Height:       General: WDWN, Alert. Tracks with eyes. Does not verbally respond. Eyes: PERRL, conjunctivae normal.  Sclera nonicteric HENT:  Smock/AT, external ears normal.  Nares patent without epistasis.  Mucous membranes are moist. Posterior pharynx clear of any exudate or lesions. Normal dentition.  Neck: Soft, normal range of motion, supple, no masses, no thyromegaly.  Trachea midline Respiratory: clear to auscultation bilaterally, no wheezing, no crackles. Normal respiratory effort. No accessory muscle use.  Cardiovascular: Regular rate and rhythm, no murmurs / rubs / gallops. No extremity edema. 2+ pedal pulses. No carotid bruits.  Abdomen: Soft, no tenderness, nondistended, no rebound or guarding.  No masses palpated. No hepatosplenomegaly. Bowel sounds normoactive Musculoskeletal: FROM. no clubbing / cyanosis. No joint deformity upper and lower extremities. no contractures. Normal muscle tone.  Skin: Warm, dry, intact no rashes, lesions, ulcers. No induration Neurologic: CN 2-12 grossly intact.  Normal speech.  Sensation intact, patella DTR +1 bilaterally. Strength 5/5 in all extremities.   Psychiatric: Normal judgment and insight.  Normal mood.    Labs on Admission: I have personally reviewed following labs and  imaging studies  CBC: Recent Labs  Lab 11/09/20 2112  WBC 10.5  NEUTROABS 8.2*  HGB 17.0  HCT 56.9*  MCV 91.8  PLT 397    Basic Metabolic Panel: Recent Labs  Lab 11/09/20 2112  NA 168*  K 5.4*  CL 128*  CO2 21*  GLUCOSE 157*  BUN 101*  CREATININE 3.10*  CALCIUM 9.7    GFR: Estimated Creatinine Clearance: 27.7 mL/min (A) (by C-G formula based on SCr of 3.1 mg/dL (H)).  Liver Function Tests: Recent Labs  Lab 11/09/20 2112  AST 32  ALT 28  ALKPHOS 72  BILITOT 2.5*  PROT 7.1  ALBUMIN 2.9*    Urine analysis:    Component Value Date/Time   COLORURINE AMBER (A) 11/09/2020 2239   APPEARANCEUR HAZY (A) 11/09/2020 2239   LABSPEC 1.020 11/09/2020 2239   PHURINE 5.0 11/09/2020 2239   GLUCOSEU NEGATIVE 11/09/2020 2239   HGBUR SMALL (A) 11/09/2020 2239   BILIRUBINUR NEGATIVE 11/09/2020 2239   BILIRUBINUR negative 06/08/2020 1632   KETONESUR NEGATIVE 11/09/2020 2239   PROTEINUR NEGATIVE 11/09/2020 2239   UROBILINOGEN 0.2 06/08/2020 1632   NITRITE NEGATIVE 11/09/2020 2239   LEUKOCYTESUR SMALL (A) 11/09/2020 2239    Radiological Exams on Admission: CT Code Stroke CTA Head W/WO contrast  Result Date: 11/09/2020 CLINICAL DATA:  Initial evaluation for acute stroke, left-sided weakness. EXAM: CT ANGIOGRAPHY HEAD AND NECK CT PERFUSION BRAIN TECHNIQUE: Multidetector CT imaging of the head and neck was performed using the standard protocol during bolus administration of intravenous contrast. Multiplanar CT image reconstructions and MIPs were obtained to evaluate the vascular anatomy. Carotid stenosis measurements (when applicable) are obtained utilizing NASCET criteria, using the distal internal carotid diameter as the denominator. Multiphase CT imaging of the brain was performed following IV bolus contrast injection. Subsequent parametric perfusion maps were calculated using RAPID software. CONTRAST:  100 cc of Omnipaque 350. COMPARISON:  Prior head CT from earlier the same  day. FINDINGS: CTA NECK FINDINGS Aortic arch: Visualized aortic arch of normal caliber with normal branch pattern. No hemodynamically significant stenosis about the origin of the great vessels. Visualized subclavian arteries widely patent. Right carotid system: Right common and internal carotid arteries widely patent without stenosis, dissection or occlusion. Left carotid system: Left CCA patent from its origin to the bifurcation without stenosis. Mild eccentric soft plaque at the origin of the left ICA without significant stenosis. Left ICA patent distally without stenosis, dissection or occlusion. Vertebral arteries: Both vertebral arteries arise from the subclavian arteries. Dominant left vertebral artery with a diffusely hypoplastic right vertebral artery. Left vertebral artery widely patent within the neck without stenosis or other abnormality. Severe stenosis at the origin of the right vertebral artery. Hypoplastic right vertebral artery otherwise patent within the neck without stenosis or other abnormality. Skeleton: No visible acute osseous abnormality, although cervical spine better evaluated on concomitant CT of the cervical spine. No discrete or worrisome osseous lesions. Other neck: No other acute soft tissue abnormality within the neck. No mass or adenopathy. Upper chest: Scattered atelectatic changes noted within the visualized lungs. Few scattered superimposed tree-in-bud nodular densities within the peripheral right upper lobe could reflect changes of acute bronchiolitis and/or mucoid impaction. No frank airspace consolidation. Review of the MIP images confirms the above findings CTA HEAD FINDINGS Anterior circulation: Petrous, cavernous, and supraclinoid segments of both internal carotid arteries are widely patent without stenosis. A1 segments patent bilaterally. Normal anterior communicating artery. 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 Finding presumably chronic in nature given  the chronic bilateral ACA territory infarcts seen on prior noncontrast head CT. Scant distal reconstitution, likely collateral. M1 segments patent bilaterally. Normal MCA bifurcations. Distal MCA branches well perfused and symmetric. Posterior circulation: Dominant left vertebral artery widely patent to the vertebrobasilar junction. Left PICA patent. Hypoplastic right vertebral artery grossly patent to the vertebrobasilar junction as well. Right PICA not visualized. Basilar patent to its distal aspect without stenosis. Superior cerebellar arteries patent bilaterally. Fetal type origin of the left PCA. Right PCA supplied via the basilar as well as a robust right posterior communicating artery. Scattered atheromatous irregularity within both PCAs without high-grade stenosis.  Short-segment mild right P2 stenosis noted (series 10, image 19). Venous sinuses: Not well assessed due to timing of the contrast bolus. Anatomic variants: Fetal type origin of the left PCA. Hypoplastic right vertebral artery. 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 Review of the MIP images confirms the above findings CT Brain Perfusion Findings: ASPECTS: 10 CBF (<30%) Volume: 38mL Perfusion (Tmax>6.0s) volume: 55mL Mismatch Volume: 80mL Infarction Location:Negative CT perfusion for acute ischemia. Delayed perfusion seen within the right ACA distribution, in keeping with the chronic ACA occlusion. IMPRESSION: CTA HEAD AND NECK IMPRESSION: 1. Negative CTA for emergent large vessel occlusion. 2. 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 3. Scattered mild for age atheromatous change elsewhere about the major arterial vasculature of the head and neck. No other hemodynamically significant or correctable stenosis. 4. Few scattered tree-in-bud nodular densities within right upper lobe, nonspecific, but could reflect changes of acute bronchiolitis and/or mucoid impaction. No frank airspace  consolidation. CT PERFUSION IMPRESSION: 1. Negative CT perfusion for acute ischemia. 2. 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 Critical Value/emergent results were called by telephone at the time of interpretation on 11/09/2020 at 9:45 pm to provider [facility] , who verbally acknowledged these results. Electronically Signed   By: Rise Mu M.D.   On: 11/09/2020 22:11   DG Pelvis 1-2 Views  Result Date: 11/09/2020 CLINICAL DATA:  Fall EXAM: PELVIS - 1-2 VIEW COMPARISON:  CT 03/22/2014 FINDINGS: Bones of the pelvis appear intact and congruent. Proximal femora intact and normally located within the acetabula. Moderate degenerative changes noted throughout the lower lumbar spine, bilateral SI joints, and bilateral hips. Some mild left hip soft tissue swelling is present. Finding on a background of diffuse mild body wall edema. Some hyperdense likely excreted contrast media seen within the urinary bladder. Slightly lobular, crenulated bladder contours and possible diverticular outpouchings, can be seen in the setting chronic outlet obstruction particularly in this patient with a history of prostatomegaly seen on comparison pelvic CT. No other acute or suspicious soft tissue abnormalities. IMPRESSION: 1. Mild left hip soft tissue swelling without acute fracture or traumatic malalignment. 2. Moderate degenerative changes throughout the spine, bilateral SI joints and hips. 3. Bladder opacified by excreted contrast media. Slightly lobular, crenulated bladder contours and possible diverticular outpouchings, can be seen in the setting of chronic outlet obstruction particularly in this patient with a history of prostatomegaly. Electronically Signed   By: Kreg Shropshire M.D.   On: 11/09/2020 22:32   CT Code Stroke CTA Neck W/WO contrast  Result Date: 11/09/2020 CLINICAL DATA:  Initial evaluation for acute stroke,  left-sided weakness. EXAM: CT ANGIOGRAPHY HEAD AND NECK CT PERFUSION BRAIN TECHNIQUE: Multidetector CT imaging of the head and neck was performed using the standard protocol during bolus administration of intravenous contrast. Multiplanar CT image reconstructions and MIPs were obtained to evaluate the vascular anatomy. Carotid stenosis measurements (when applicable) are obtained utilizing NASCET criteria, using the distal internal carotid diameter as the denominator. Multiphase CT imaging of the brain was performed following IV bolus contrast injection. Subsequent parametric perfusion maps were calculated using RAPID software. CONTRAST:  100 cc of Omnipaque 350. COMPARISON:  Prior head CT from earlier the same day. FINDINGS: CTA NECK FINDINGS Aortic arch: Visualized aortic arch of normal caliber with normal branch pattern. No hemodynamically significant stenosis about the origin of the great vessels. Visualized subclavian arteries widely patent. Right carotid system:  Right common and internal carotid arteries widely patent without stenosis, dissection or occlusion. Left carotid system: Left CCA patent from its origin to the bifurcation without stenosis. Mild eccentric soft plaque at the origin of the left ICA without significant stenosis. Left ICA patent distally without stenosis, dissection or occlusion. Vertebral arteries: Both vertebral arteries arise from the subclavian arteries. Dominant left vertebral artery with a diffusely hypoplastic right vertebral artery. Left vertebral artery widely patent within the neck without stenosis or other abnormality. Severe stenosis at the origin of the right vertebral artery. Hypoplastic right vertebral artery otherwise patent within the neck without stenosis or other abnormality. Skeleton: No visible acute osseous abnormality, although cervical spine better evaluated on concomitant CT of the cervical spine. No discrete or worrisome osseous lesions. Other neck: No other acute  soft tissue abnormality within the neck. No mass or adenopathy. Upper chest: Scattered atelectatic changes noted within the visualized lungs. Few scattered superimposed tree-in-bud nodular densities within the peripheral right upper lobe could reflect changes of acute bronchiolitis and/or mucoid impaction. No frank airspace consolidation. Review of the MIP images confirms the above findings CTA HEAD FINDINGS Anterior circulation: Petrous, cavernous, and supraclinoid segments of both internal carotid arteries are widely patent without stenosis. A1 segments patent bilaterally. Normal anterior communicating artery. 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 Finding presumably chronic in nature given the chronic bilateral ACA territory infarcts seen on prior noncontrast head CT. Scant distal reconstitution, likely collateral. M1 segments patent bilaterally. Normal MCA bifurcations. Distal MCA branches well perfused and symmetric. Posterior circulation: Dominant left vertebral artery widely patent to the vertebrobasilar junction. Left PICA patent. Hypoplastic right vertebral artery grossly patent to the vertebrobasilar junction as well. Right PICA not visualized. Basilar patent to its distal aspect without stenosis. Superior cerebellar arteries patent bilaterally. Fetal type origin of the left PCA. Right PCA supplied via the basilar as well as a robust right posterior communicating artery. Scattered atheromatous irregularity within both PCAs without high-grade stenosis. Short-segment mild right P2 stenosis noted (series 10, image 19). Venous sinuses: Not well assessed due to timing of the contrast bolus. Anatomic variants: Fetal type origin of the left PCA. Hypoplastic right vertebral artery. 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 Review of the MIP images confirms the above findings CT Brain Perfusion Findings: ASPECTS: 10 CBF (<30%) Volume: 0mL Perfusion (Tmax>6.0s) volume: 18mL Mismatch Volume: 18mL Infarction  Location:Negative CT perfusion for acute ischemia. Delayed perfusion seen within the right ACA distribution, in keeping with the chronic ACA occlusion. IMPRESSION: CTA HEAD AND NECK IMPRESSION: 1. Negative CTA for emergent large vessel occlusion. 2. 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 3. Scattered mild for age atheromatous change elsewhere about the major arterial vasculature of the head and neck. No other hemodynamically significant or correctable stenosis. 4. Few scattered tree-in-bud nodular densities within right upper lobe, nonspecific, but could reflect changes of acute bronchiolitis and/or mucoid impaction. No frank airspace consolidation. CT PERFUSION IMPRESSION: 1. Negative CT perfusion for acute ischemia. 2. 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 Critical Value/emergent results were called by telephone at the time of interpretation on 11/09/2020 at 9:45 pm to provider [facility] , who verbally acknowledged these results. Electronically Signed   By: Rise Mu M.D.   On: 11/09/2020 22:11   CT C-SPINE NO CHARGE  Result Date: 11/09/2020 CLINICAL DATA:  Left-sided weakness EXAM: CT CERVICAL SPINE WITHOUT CONTRAST  TECHNIQUE: Multidetector CT imaging of the cervical spine was performed without intravenous contrast. Multiplanar CT image reconstructions were also generated. COMPARISON:  None. FINDINGS: Alignment: Alignment is anatomic. Skull base and vertebrae: No acute fracture. No primary bone lesion or focal pathologic process. Soft tissues and spinal canal: Please refer to separately reported CT angiography of the neck for description of vascular findings. No prevertebral fluid or swelling. No visible canal hematoma. Disc levels: Bridging anterior osteophytes are seen throughout the entire cervical spine. Mild disc  osteophyte complex at C4-5. No significant central canal or neural foraminal encroachment. Upper chest: Airway is patent. Lung apices are clear. Other: Reconstructed images confirm the above findings. IMPRESSION: 1. No acute cervical spine fracture. 2. Diffuse cervical spondylosis. No significant neural foraminal or central canal stenosis. Electronically Signed   By: Sharlet Salina M.D.   On: 11/09/2020 22:02   CT Code Stroke Cerebral Perfusion with contrast  Result Date: 11/09/2020 CLINICAL DATA:  Initial evaluation for acute stroke, left-sided weakness. EXAM: CT ANGIOGRAPHY HEAD AND NECK CT PERFUSION BRAIN TECHNIQUE: Multidetector CT imaging of the head and neck was performed using the standard protocol during bolus administration of intravenous contrast. Multiplanar CT image reconstructions and MIPs were obtained to evaluate the vascular anatomy. Carotid stenosis measurements (when applicable) are obtained utilizing NASCET criteria, using the distal internal carotid diameter as the denominator. Multiphase CT imaging of the brain was performed following IV bolus contrast injection. Subsequent parametric perfusion maps were calculated using RAPID software. CONTRAST:  100 cc of Omnipaque 350. COMPARISON:  Prior head CT from earlier the same day. FINDINGS: CTA NECK FINDINGS Aortic arch: Visualized aortic arch of normal caliber with normal branch pattern. No hemodynamically significant stenosis about the origin of the great vessels. Visualized subclavian arteries widely patent. Right carotid system: Right common and internal carotid arteries widely patent without stenosis, dissection or occlusion. Left carotid system: Left CCA patent from its origin to the bifurcation without stenosis. Mild eccentric soft plaque at the origin of the left ICA without significant stenosis. Left ICA patent distally without stenosis, dissection or occlusion. Vertebral arteries: Both vertebral arteries arise from the subclavian  arteries. Dominant left vertebral artery with a diffusely hypoplastic right vertebral artery. Left vertebral artery widely patent within the neck without stenosis or other abnormality. Severe stenosis at the origin of the right vertebral artery. Hypoplastic right vertebral artery otherwise patent within the neck without stenosis or other abnormality. Skeleton: No visible acute osseous abnormality, although cervical spine better evaluated on concomitant CT of the cervical spine. No discrete or worrisome osseous lesions. Other neck: No other acute soft tissue abnormality within the neck. No mass or adenopathy. Upper chest: Scattered atelectatic changes noted within the visualized lungs. Few scattered superimposed tree-in-bud nodular densities within the peripheral right upper lobe could reflect changes of acute bronchiolitis and/or mucoid impaction. No frank airspace consolidation. Review of the MIP images confirms the above findings CTA HEAD FINDINGS Anterior circulation: Petrous, cavernous, and supraclinoid segments of both internal carotid arteries are widely patent without stenosis. A1 segments patent bilaterally. Normal anterior communicating artery. 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 Finding presumably chronic in nature given the chronic bilateral ACA territory infarcts seen on prior noncontrast head CT. Scant distal reconstitution, likely collateral. M1 segments patent bilaterally. Normal MCA bifurcations. Distal MCA branches well perfused and symmetric. Posterior circulation: Dominant left vertebral artery widely patent to the vertebrobasilar junction. Left PICA patent. Hypoplastic right vertebral artery grossly patent to the vertebrobasilar junction as well. Right  PICA not visualized. Basilar patent to its distal aspect without stenosis. Superior cerebellar arteries patent bilaterally. Fetal type origin of the left PCA. Right PCA supplied via the basilar as well as a robust right posterior  communicating artery. Scattered atheromatous irregularity within both PCAs without high-grade stenosis. Short-segment mild right P2 stenosis noted (series 10, image 19). Venous sinuses: Not well assessed due to timing of the contrast bolus. Anatomic variants: Fetal type origin of the left PCA. Hypoplastic right vertebral artery. 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 Review of the MIP images confirms the above findings CT Brain Perfusion Findings: ASPECTS: 10 CBF (<30%) Volume: 0mL Perfusion (Tmax>6.0s) volume: 18mL Mismatch Volume: 18mL Infarction Location:Negative CT perfusion for acute ischemia. Delayed perfusion seen within the right ACA distribution, in keeping with the chronic ACA occlusion. IMPRESSION: CTA HEAD AND NECK IMPRESSION: 1. Negative CTA for emergent large vessel occlusion. 2. 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 3. Scattered mild for age atheromatous change elsewhere about the major arterial vasculature of the head and neck. No other hemodynamically significant or correctable stenosis. 4. Few scattered tree-in-bud nodular densities within right upper lobe, nonspecific, but could reflect changes of acute bronchiolitis and/or mucoid impaction. No frank airspace consolidation. CT PERFUSION IMPRESSION: 1. Negative CT perfusion for acute ischemia. 2. 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 Critical Value/emergent results were called by telephone at the time of interpretation on 11/09/2020 at 9:45 pm to provider [facility] , who verbally acknowledged these results. Electronically Signed   By: Rise Mu M.D.   On: 11/09/2020 22:11   DG Chest Port 1 View  Result Date: 11/09/2020 CLINICAL DATA:  Fall EXAM: PORTABLE CHEST 1 VIEW COMPARISON:  None. FINDINGS: The heart size and mediastinal contours are within normal limits. Both lungs are  clear. The visualized skeletal structures are unremarkable. IMPRESSION: No active disease. Electronically Signed   By: Jonna Clark M.D.   On: 11/09/2020 22:38   CT HEAD CODE STROKE WO CONTRAST  Result Date: 11/09/2020 CLINICAL DATA:  Code stroke. Initial evaluation for acute left-sided weakness, stroke suspected. EXAM: CT HEAD WITHOUT CONTRAST TECHNIQUE: Contiguous axial images were obtained from the base of the skull through the vertex without intravenous contrast. COMPARISON:  None available. FINDINGS: Brain: Age-related cerebral atrophy with chronic small vessel ischemic disease. Remote lacunar infarct present at the left caudate. Remote bilateral ACA territory infarcts noted. No acute intracranial hemorrhage. No acute large vessel territory infarct. No mass lesion, mass effect or midline shift. Mild ventricular prominence related to global parenchymal volume loss without hydrocephalus. No extra-axial fluid collection. Vascular: No hyperdense vessel. Skull: Scalp soft tissues demonstrate no acute finding. Small lipoma noted at the posterior scalp. Calvarium intact. Sinuses/Orbits: Globes and orbital soft tissues within normal limits. Chronic frontal sinusitis noted. Scattered mucosal thickening noted elsewhere within the sphenoid ethmoidal sinuses. No mastoid effusion. Other: None. ASPECTS [facility] Stroke Program Early CT Score) - Ganglionic level infarction (caudate, lentiform nuclei, internal capsule, insula, M1-M3 cortex): 7 - Supraganglionic infarction (M4-M6 cortex): 3 Total score (0-10 with 10 being normal): 10 IMPRESSION: 1. No acute intracranial infarct or other abnormality. 2. ASPECTS is 10. 3. Remote bilateral ACA territory infarcts, with additional remote lacunar infarct at the left basal ganglia. 4. Underlying atrophy with chronic small vessel ischemic disease. These results were communicated to Dr. Wilford Corner at 9:31 pmon 2/8/2022by text page via the [facility] messaging system. Electronically Signed  By:  Rise Mu M.D.   On: 11/09/2020 21:51    EKG: Independently reviewed.  EKG shows atrial fibrillation with RVR with no acute ST elevation or depression.  QTc 490  Assessment/Plan Principal Problem:   Atrial fibrillation with RVR  Mr. Fiorito is admitted to progressive care unit. He was placed on Cardizem infusion in the emergency room but continued to run a heart rate in the 150-160 with atrial fibrillation.  Patient was converted to amiodarone infusion.  I discussed this with the on-call cardiology fellow who recommended that if amiodarone does not help with A. fib with RVR to give him a call and cardiology will consult at that time. Obtain echocardiogram in the morning. Serial troponin levels. Active Problems:   Acute metabolic encephalopathy CT head is negative for acute pathology. No large vessel occulsion on CTA head. Pt with old infarcts on CT head.  Chronic Azygos ACA occlusions noted.  MRI head is ordered and pending.  Neurology consulted and has evaluated pt. Appreciate Dr. Bess Harvest input.     Hypernatremia   Acute renal failure superimposed on stage 3 chronic kidney disease  Increased creatinine over baseline. Nephrology consulted and appreciate the assistance with pt care.     Elevated troponin Check serial troponin levels. If troponin elevates will consult cardiology.     COVID-19 virus infection Sarted on remdesivir Supplemental oxygen provided keep O2 sat between 92 to 96% Albuterol MDI every 4 hours as needed for wheezing cough, shortness of breath Incentive spirometer every 1-2 hours while awake     DVT prophylaxis: Heparin infusion for anticoagulation with a-fib Code Status:   Full Code  Family Communication:  No family at bedside Disposition Plan:   Patient is from:  Home  Anticipated DC to:  To be determined  Anticipated DC date:  Anticipate more than 2 midnight stay in hospital to treat acute condition    Consults called:  Neurology-Dr. Wilford Corner;   Nephrology-Dr. Thedore Mins Admission status:  Inpatient   Claudean Severance Zannah Melucci MD [facility]  How to contact the [facility] Attending or Consulting provider 7A - 7P or covering provider during after hours 7P -7A, for this patient?   1. Check the care team in [facility] and look for a) attending/consulting [facility] provider listed and b) the [facility] team listed 2. Log into [email or web address] and use [place]'s universal password to access. If you do not have the password, please contact the hospital operator. 3. Locate the [facility] provider you are looking for under [facility] and page to a number that you can be directly reached. 4. If you still have difficulty reaching the provider, please page the [facility] (Director on Call) for the Hospitalists listed on amion for assistance.  11/09/2020, 11:45 PM

## 2020-11-09 NOTE — ED Notes (Signed)
Called to cancel Code Stroke

## 2020-11-09 NOTE — Progress Notes (Signed)
Informed of consult 70 year old male with a PMH of CKD3 (BL Cr around 1.5-1.8), HFrEF (EF 35-40%), A fib s/p DCCV (on Eliquis, last dose unknown), dilated CM brought to the ER via EMS after being found down at home. Found by his friends who had not heard from a week. Found to be in afib w/ RVR here and COVID positive. Code stroke initially activated however since he is outside the window for any intervention, code stroke was cancelled. Found to have a Na of 168, K 5.4 (slightly hemolyzed), Cl 128, CO2 21, BUN 101, Cr 3.1. Lactate 2.4, Hgb 17. CK 185. UA significant for bacteria, hyaline casts, 11-20 RBCs, 21-50 WBCs/HPF.  Recommendations: -Based on his labs (hypernatremic, hgb 17, hyaline casts) he seems to hypovolemic therefore I agree with isotonic fluids in the interim given Na content will be relatively "hypotonic" as compared to his labs. Once Na starts to trend down he may very well need to switch to hypotonic fluids or D5W. Free water deficit is around 11.8 liters. -Monitor serial labs -Check renal ultrasound -Recommend checking urine culture, recommend empiric antibiotics in the meantime -Discussed with ER -Full consult to follow in AM, please call with any questions/concerns in the meantime  Anthony Sar, MD [facility]

## 2020-11-10 ENCOUNTER — Inpatient Hospital Stay (HOSPITAL_COMMUNITY): Payer: Medicare Other

## 2020-11-10 DIAGNOSIS — N183 Chronic kidney disease, stage 3 unspecified: Secondary | ICD-10-CM | POA: Diagnosis not present

## 2020-11-10 DIAGNOSIS — I4891 Unspecified atrial fibrillation: Secondary | ICD-10-CM

## 2020-11-10 DIAGNOSIS — N179 Acute kidney failure, unspecified: Principal | ICD-10-CM

## 2020-11-10 LAB — BASIC METABOLIC PANEL
BUN: 85 mg/dL — ABNORMAL HIGH (ref 8–23)
CO2: 17 mmol/L — ABNORMAL LOW (ref 22–32)
Calcium: 8.3 mg/dL — ABNORMAL LOW (ref 8.9–10.3)
Chloride: >130 mmol/L (ref 98–111)
Creatinine, Ser: 2.55 mg/dL — ABNORMAL HIGH (ref 0.61–1.24)
GFR, Estimated: 27 mL/min — ABNORMAL LOW (ref >60)
Glucose, Bld: 210 mg/dL — ABNORMAL HIGH (ref 70–99)
Potassium: 4 mmol/L (ref 3.5–5.1)
Sodium: 164 mmol/L (ref 135–145)

## 2020-11-10 LAB — GLUCOSE, CAPILLARY
Glucose-Capillary: 137 mg/dL — ABNORMAL HIGH (ref 70–99)
Glucose-Capillary: 184 mg/dL — ABNORMAL HIGH (ref 70–99)

## 2020-11-10 LAB — CK: Total CK: 175 U/L (ref 49–397)

## 2020-11-10 LAB — CBC WITH DIFFERENTIAL/PLATELET
Abs Immature Granulocytes: 0.05 10*3/uL (ref 0.00–0.07)
Basophils Absolute: 0 10*3/uL (ref 0.0–0.1)
Basophils Relative: 0 %
Eosinophils Absolute: 0 10*3/uL (ref 0.0–0.5)
Eosinophils Relative: 0 %
HCT: 50.7 % (ref 39.0–52.0)
Hemoglobin: 14.9 g/dL (ref 13.0–17.0)
Immature Granulocytes: 1 %
Lymphocytes Relative: 6 %
Lymphs Abs: 0.6 10*3/uL — ABNORMAL LOW (ref 0.7–4.0)
MCH: 27.2 pg (ref 26.0–34.0)
MCHC: 29.4 g/dL — ABNORMAL LOW (ref 30.0–36.0)
MCV: 92.5 fL (ref 80.0–100.0)
Monocytes Absolute: 0.5 10*3/uL (ref 0.1–1.0)
Monocytes Relative: 5 %
Neutro Abs: 8.3 10*3/uL — ABNORMAL HIGH (ref 1.7–7.7)
Neutrophils Relative %: 88 %
Platelets: 318 10*3/uL (ref 150–400)
RBC: 5.48 MIL/uL (ref 4.22–5.81)
RDW: 16.8 % — ABNORMAL HIGH (ref 11.5–15.5)
WBC: 9.4 10*3/uL (ref 4.0–10.5)
nRBC: 0 % (ref 0.0–0.2)

## 2020-11-10 LAB — COMPREHENSIVE METABOLIC PANEL
ALT: 25 U/L (ref 0–44)
AST: 21 U/L (ref 15–41)
Albumin: 2.5 g/dL — ABNORMAL LOW (ref 3.5–5.0)
Alkaline Phosphatase: 62 U/L (ref 38–126)
BUN: 89 mg/dL — ABNORMAL HIGH (ref 8–23)
CO2: 20 mmol/L — ABNORMAL LOW (ref 22–32)
Calcium: 8.8 mg/dL — ABNORMAL LOW (ref 8.9–10.3)
Chloride: >130 mmol/L (ref 98–111)
Creatinine, Ser: 2.65 mg/dL — ABNORMAL HIGH (ref 0.61–1.24)
GFR, Estimated: 25 mL/min — ABNORMAL LOW (ref >60)
Glucose, Bld: 184 mg/dL — ABNORMAL HIGH (ref 70–99)
Potassium: 4.6 mmol/L (ref 3.5–5.1)
Sodium: 166 mmol/L (ref 135–145)
Total Bilirubin: 1.4 mg/dL — ABNORMAL HIGH (ref 0.3–1.2)
Total Protein: 6.2 g/dL — ABNORMAL LOW (ref 6.5–8.1)

## 2020-11-10 LAB — RENAL FUNCTION PANEL
Albumin: 2.6 g/dL — ABNORMAL LOW (ref 3.5–5.0)
BUN: 85 mg/dL — ABNORMAL HIGH (ref 8–23)
CO2: 20 mmol/L — ABNORMAL LOW (ref 22–32)
Calcium: 8.5 mg/dL — ABNORMAL LOW (ref 8.9–10.3)
Chloride: >130 mmol/L (ref 98–111)
Creatinine, Ser: 2.58 mg/dL — ABNORMAL HIGH (ref 0.61–1.24)
GFR, Estimated: 26 mL/min — ABNORMAL LOW (ref >60)
Glucose, Bld: 152 mg/dL — ABNORMAL HIGH (ref 70–99)
Phosphorus: 3.5 mg/dL (ref 2.5–4.6)
Potassium: 4.1 mmol/L (ref 3.5–5.1)
Sodium: 167 mmol/L (ref 135–145)

## 2020-11-10 LAB — ECHOCARDIOGRAM LIMITED
Height: 72 in
S' Lateral: 3.4 cm
Weight: 3481.5 oz

## 2020-11-10 LAB — C-REACTIVE PROTEIN: CRP: 2.7 mg/dL — ABNORMAL HIGH (ref <1.0)

## 2020-11-10 LAB — APTT: aPTT: 68 seconds — ABNORMAL HIGH (ref 24–36)

## 2020-11-10 LAB — LACTIC ACID, PLASMA: Lactic Acid, Venous: 3.2 mmol/L (ref 0.5–1.9)

## 2020-11-10 LAB — TROPONIN I (HIGH SENSITIVITY): Troponin I (High Sensitivity): 55 ng/L — ABNORMAL HIGH (ref <18)

## 2020-11-10 LAB — HEPARIN LEVEL (UNFRACTIONATED): Heparin Unfractionated: 0.59 IU/mL (ref 0.30–0.70)

## 2020-11-10 LAB — HIV ANTIBODY (ROUTINE TESTING W REFLEX): HIV Screen 4th Generation wRfx: NONREACTIVE

## 2020-11-10 MED ORDER — ALBUTEROL SULFATE HFA 108 (90 BASE) MCG/ACT IN AERS: 2.0000 | INHALATION_SPRAY | RESPIRATORY_TRACT | Status: DC | PRN

## 2020-11-10 MED ORDER — SODIUM CHLORIDE 0.9 % IV SOLN: 100.0000 mg | Freq: Every day | INTRAVENOUS | Status: DC

## 2020-11-10 MED ORDER — DILTIAZEM HCL-DEXTROSE 125-5 MG/125ML-% IV SOLN (PREMIX)
5.0000 mg/h | INTRAVENOUS | Status: DC
  Administered 2020-11-10: 5 mg/h via INTRAVENOUS
  Administered 2020-11-11 (×2): 7.5 mg/h via INTRAVENOUS
  Administered 2020-11-12: 5 mg/h via INTRAVENOUS
  Filled 2020-11-10 (×2): qty 125

## 2020-11-10 MED ORDER — SODIUM CHLORIDE 0.9 % IV SOLN: 200.0000 mg | Freq: Once | INTRAVENOUS | Status: DC

## 2020-11-10 MED ORDER — PREDNISONE 5 MG PO TABS: 50.0000 mg | ORAL_TABLET | Freq: Every day | ORAL | Status: DC

## 2020-11-10 MED ORDER — SODIUM CHLORIDE 0.9 % IV SOLN: INTRAVENOUS | Status: DC

## 2020-11-10 MED ORDER — DILTIAZEM LOAD VIA INFUSION
10.0000 mg | Freq: Once | INTRAVENOUS | Status: AC
  Administered 2020-11-10: 10 mg via INTRAVENOUS
  Filled 2020-11-10: qty 10

## 2020-11-10 MED ORDER — DEXTROSE 5 % IV SOLN: INTRAVENOUS | Status: DC

## 2020-11-10 MED ORDER — SODIUM CHLORIDE 0.9 % IV BOLUS
2000.0000 mL | Freq: Once | INTRAVENOUS | Status: AC
  Administered 2020-11-10: 2000 mL via INTRAVENOUS

## 2020-11-10 MED ORDER — CHLORHEXIDINE GLUCONATE CLOTH 2 % EX PADS
6.0000 | MEDICATED_PAD | Freq: Every day | CUTANEOUS | Status: DC
  Administered 2020-11-10 – 2020-11-16 (×7): 6 via TOPICAL

## 2020-11-10 MED ORDER — METHYLPREDNISOLONE SODIUM SUCC 125 MG IJ SOLR
1.0000 mg/kg | Freq: Two times a day (BID) | INTRAMUSCULAR | Status: DC
  Administered 2020-11-10 (×2): 98.75 mg via INTRAVENOUS
  Filled 2020-11-10 (×2): qty 2

## 2020-11-10 MED ORDER — HALOPERIDOL LACTATE 5 MG/ML IJ SOLN
5.0000 mg | Freq: Four times a day (QID) | INTRAMUSCULAR | Status: AC | PRN
Start: 2020-11-10 — End: 2020-11-10
  Administered 2020-11-10: 5 mg via INTRAVENOUS
  Filled 2020-11-10: qty 1

## 2020-11-10 MED ORDER — DIGOXIN 0.25 MG/ML IJ SOLN
0.2500 mg | Freq: Once | INTRAMUSCULAR | Status: AC
  Administered 2020-11-10: 0.25 mg via INTRAVENOUS
  Filled 2020-11-10: qty 1

## 2020-11-10 MED ORDER — DILTIAZEM HCL 25 MG/5ML IV SOLN
10.0000 mg | Freq: Once | INTRAVENOUS | Status: AC
  Administered 2020-11-10: 10 mg via INTRAVENOUS
  Filled 2020-11-10: qty 5

## 2020-11-10 MED ORDER — ACETAMINOPHEN 650 MG RE SUPP: 650.0000 mg | Freq: Four times a day (QID) | RECTAL | Status: DC | PRN

## 2020-11-10 NOTE — Consult Note (Signed)
Nephrology Consult   Requesting provider: Trey Paula Service requesting consult: IM Reason for consult: Hypernatremia/AKI   Assessment/Recommendations: Jeffery Christian is a 70 y.o. male with a past medical history significant for A. fib on Eliquis, dilated cardiomyopathy, HFrEF, CKD stage 3, who presented after being found down.    Hypovolemic hypernatremia: Initial sodium level of 168.  Currently on isotonic IV fluids of normal saline at 100 cc/hr, which is hypotonic in comparison to his current sodium level. Physical exam suggest patient is fluid down with dry mucous membranes and no lower limb edema.  AKI on CKD stage III: Baseline creatinine appears to be ~1.4.  Creatinine on presentation of 3.10.  BUN elevated at 101.  Patient with anion gap acidosis with bicarb of 21 and anion gap of 19. Urinalysis shows small hgb, negative nitrite, small leuk esterase and many bacteria.  AKI likely secondary to multifactorial including A. fib with possible of hypoperfusion, contrast mediated renal stressors, and dehydration.  Appears fluid down on physical exam.  Will give 2 L sodium chloride bolus plus D5 at 125 mL/h.  Will check afternoon labs at 4 PM -Continue to monitor daily Cr, -Monitor Daily I/Os, Daily weight  -Avoid nephrotoxic medications -Check Renal U/S to rule out obstruction -Check CK level -We will check urine osmolality and urine sodium  Volume Status: Appears fluid down on exam. Based on our examination and review of available imaging, our recommendation is fluid resuscitation.  We will give 2 L normal saline bolus plus D5 at 125 mL/h.  Will check afternoon labs to monitor patient's hyponatremia..  Hypertension: Patient with some elevated blood pressure since arrival.  Now normotensive with most recent blood pressure 138/74.  Blood pressure range of 127-146/69-97 over last 5 hours.  Hyperkalemia: Initial potassium of 5.4.  Improved to 4.6 after fluid resuscitation  Atrial fib with  RVR: Placed on Cardizem drip in the emergency department which followed by amiodarone infusion.  Current heart rate of 127. Per primary  Acute metabolic encephalopathy: neurology consulted by primary team.  MRI ordered.  Per primary team/neurology.  COVID-19 infection-Per primary  Jackelyn Poling [facility] 11/10/2020 8:04 AM   _____________________________________________________________________________________ CC: AMS  History of Present Illness: Jeffery Christian is a/an 70 y.o. male with a past medical history of  A. fib on Eliquis, dilated cardiomyopathy, HFrEF, CKD stage 3, who presented after being found down.   Per documentation patient was last seen about a week ago by his friends.  Had not heard from him for some time so they went to his house to check on him and found no response, so they kicked in the door and found him lying on the ground between the dresser in the bed.  EMTs noted him to be weaker on the left compared to the right and called a code stroke bringing him to the emergency department.  In the emergency department he was initially found to have heart rate elevated in the 160-180 range with EKG showing A. fib with RVR.  He was started on Cardizem infusion but continued to have elevated heart rate and so amiodarone drip was also initiated.  Head CT was negative for acute stroke.  Neurology was consulted in the emergency department and recommended an MRI brain.  Patient was also found to be hyponatremic with initial sodium of 168.  Patient does not verbally respond to questions but nods yes or no to questions.  When given a list of possibilities he is able to nod that he  is in the hospital.   Medications:  Current Facility-Administered Medications  Medication Dose Route Frequency Provider Last Rate Last Admin  . 0.9 %  sodium chloride infusion   Intravenous Continuous Chotiner, Claudean Severance, MD 100 mL/hr at 11/10/20 0229 New Bag at 11/10/20 0229  . acetaminophen  (TYLENOL) suppository 650 mg  650 mg Rectal Q6H PRN Chotiner, Claudean Severance, MD      . albuterol (VENTOLIN HFA) 108 (90 Base) MCG/ACT inhaler 2 puff  2 puff Inhalation Q4H PRN Chotiner, Claudean Severance, MD      . amiodarone (NEXTERONE PREMIX) 360-4.14 MG/200ML-% (1.8 mg/mL) IV infusion  30 mg/hr Intravenous Continuous Chotiner, Claudean Severance, MD 16.67 mL/hr at 11/10/20 0614 30 mg/hr at 11/10/20 0614  . diltiazem (CARDIZEM) 125 mg in dextrose 5% 125 mL (1 mg/mL) infusion  5-15 mg/hr Intravenous Continuous Chotiner, Claudean Severance, MD   Stopped at 11/10/20 0058  . heparin ADULT infusion 100 units/mL (25000 units/253mL)  1,350 Units/hr Intravenous Continuous Chotiner, Claudean Severance, MD 13.5 mL/hr at 11/10/20 0100 1,350 Units/hr at 11/10/20 0100  . methylPREDNISolone sodium succinate (SOLU-MEDROL) 125 mg/2 mL injection 98.75 mg  1 mg/kg Intravenous Q12H Chotiner, Claudean Severance, MD   98.75 mg at 11/10/20 0230   Followed by  . [START ON 11/13/2020] predniSONE (DELTASONE) tablet 50 mg  50 mg Oral Daily Chotiner, Claudean Severance, MD      . remdesivir 100 mg in sodium chloride 0.9 % 100 mL IVPB  100 mg Intravenous Daily Chotiner, Claudean Severance, MD      . sodium chloride flush (NS) 0.9 % injection 3 mL  3 mL Intravenous Once Wynetta Fines, MD       Current Outpatient Medications  Medication Sig Dispense Refill  . allopurinol (ZYLOPRIM) 300 MG tablet Take 1 tablet (300 mg total) by mouth 2 (two) times daily. 60 tablet 6  . apixaban (ELIQUIS) 5 MG TABS tablet Take 1 tablet (5 mg total) by mouth 2 (two) times daily. 60 tablet 6  . atorvastatin (LIPITOR) 20 MG tablet Take 1 tablet (20 mg total) by mouth daily. 30 tablet 6  . ciprofloxacin (CIPRO) 500 MG tablet Take 1 tablet (500 mg total) by mouth 2 (two) times daily. (Patient not taking: Reported on 09/08/2020) 6 tablet 0  . colchicine 0.6 MG tablet Take 2 tabs (1.2 mg) at the onset of a gout flare and repeat 1 tab (0.6 mg) in 1 hour if symptoms persist 30 tablet 6  . furosemide (LASIX) 20 MG  tablet Take 1 tablet (20 mg total) by mouth daily. 30 tablet 6  . metoprolol succinate (TOPROL-XL) 25 MG 24 hr tablet Take 1 tablet (25 mg total) by mouth daily. Take with or immediately following a meal. 30 tablet 6  . predniSONE (DELTASONE) 20 MG tablet 2 tabs daily x 2 days, then 1.5 tabs daily x 2 days then 1 tab PO daily x 2 days (Patient not taking: Reported on 09/08/2020) 9 tablet 0     ALLERGIES Patient has no known allergies.  MEDICAL HISTORY Past Medical History:  Diagnosis Date  . Chronic systolic CHF (congestive heart failure) (HCC)    EF 35-40, diffuse HK, mild MR, moderate LAE, mild RAE, PASP 44, L pleural eff  . Dilated cardiomyopathy (HCC)    likely related to tachycardia  . Persistent atrial fibrillation (HCC)      SOCIAL HISTORY Social History   Socioeconomic History  . Marital status: Divorced    Spouse name: Not on file  .  Number of children: Not on file  . Years of education: Not on file  . Highest education level: Not on file  Occupational History  . Not on file  Tobacco Use  . Smoking status: Never Smoker  . Smokeless tobacco: Never Used  Substance and Sexual Activity  . Alcohol use: Yes    Comment: "pt states he "does not drink a lot anymore"  . Drug use: No  . Sexual activity: Not on file  Other Topics Concern  . Not on file  Social History Narrative  . Not on file   Social Determinants of Health   Financial Resource Strain: Not on file  Food Insecurity: Not on file  Transportation Needs: Not on file  Physical Activity: Not on file  Stress: Not on file  Social Connections: Not on file  Intimate Partner Violence: Not on file     FAMILY HISTORY Family History  Problem Relation Age of Onset  . Leukemia Father        29  . Cancer Mother       Review of Systems: 12 systems reviewed Otherwise as per HPI, all other systems reviewed and negative  Physical Exam: Vitals:   11/10/20 0445 11/10/20 0615  BP: (!) 141/71 138/74  Pulse:  (!) 109 83  Resp: (!) 21 18  Temp:  (!) 96.5 F (35.8 C)  SpO2: 100% 100%   No intake/output data recorded.  Intake/Output Summary (Last 24 hours) at 11/10/2020 0804 Last data filed at 11/09/2020 2259 Gross per 24 hour  Intake 2.05 ml  Output --  Net 2.05 ml   General: Frail-appearing elderly gentleman lying in bed HEENT: anicteric sclera, mucous membranes dry CV: Tachycardic rate, no murmurs, no gallops, no rubs, no peripheral edema Lungs: Clear to auscultation bilaterally, normal work of breathing Abd: soft, non-tender, non-distended Skin: no visible lesions or rashes Psych: Alert but does not respond to questions verbally, he does not his head yes or no in response to questions Musculoskeletal: no obvious deformities Neuro: Patient does not respond to speech verbally, he does not his head and seems to understand questions.  Test Results Reviewed Lab Results  Component Value Date   NA 168 (HH) 11/09/2020   K 5.4 (H) 11/09/2020   CL 128 (H) 11/09/2020   CO2 21 (L) 11/09/2020   BUN 101 (H) 11/09/2020   CREATININE 3.10 (H) 11/09/2020   CALCIUM 9.7 11/09/2020   ALBUMIN 2.9 (L) 11/09/2020     I have reviewed all relevant outside healthcare records related to the patient's current hospitalization  Jackelyn Poling, DO

## 2020-11-10 NOTE — Progress Notes (Signed)
Patient still in yellow MEWs, charge RN Tobi Bastos made aware. Pt in no distress, will continue to monitor.

## 2020-11-10 NOTE — Progress Notes (Signed)
CRITICAL VALUE ALERT  Critical Value:  Na 166 and Chloride >130  Date & Time Notied:  10/10/20 at 0945  Provider Notified: Dr. Randol Kern  Orders Received/Actions taken: NA

## 2020-11-10 NOTE — Progress Notes (Addendum)
ANTICOAGULATION CONSULT NOTE  Pharmacy Consult for heparin Indication: atrial fibrillation  No Known Allergies  Patient Measurements: Height: 6' (182.9 cm) Weight: 98.7 kg (217 lb 9.5 oz) IBW/kg (Calculated) : 77.6 Heparin Dosing Weight: 97.5 kg  Vital Signs: Temp: 96.5 F (35.8 C) (02/09 0615) Temp Source: Temporal (02/09 0615) BP: 134/98 (02/09 0900) Pulse Rate: 113 (02/09 0900)  Labs: Recent Labs    11/09/20 2112 11/09/20 2242 11/10/20 0057 11/10/20 0804  HGB 17.0  --   --  14.9  HCT 56.9*  --   --  50.7  PLT 397  --   --  318  APTT  --  36  --  68*  LABPROT  --  16.5*  --   --   INR  --  1.4*  --   --   HEPARINUNFRC  --   --   --  0.59  CREATININE 3.10*  --   --   --   CKTOTAL 185  --   --   --   TROPONINIHS 59*  --  55*  --     Estimated Creatinine Clearance: 27.7 mL/min (A) (by C-G formula based on SCr of 3.1 mg/dL (H)).   Medical History: Past Medical History:  Diagnosis Date  . Chronic systolic CHF (congestive heart failure) (HCC)    EF 35-40, diffuse HK, mild MR, moderate LAE, mild RAE, PASP 44, L pleural eff  . Dilated cardiomyopathy (HCC)    likely related to tachycardia  . Persistent atrial fibrillation (HCC)    Assessment: 69 yom on eliquis PTA presenting with AMS, currently continuing on heparin IV. Last dose eliquis unknown.  Initial aPTT therapeutic at 68 but near bottom of range, heparin level not yet correlating (due to influence of apixaban) at 0.59. Will monitor aPTT until levels correlating. CBC wnl. Noted SCr 3.1 on presentation (none yet today). No active bleed issues reported.  Goal of Therapy:  Heparin level 0.3-0.7 units/ml aPTT 66-102 seconds Monitor platelets by anticoagulation protocol: Yes   Plan:  Increase heparin drip slightly to 1400 units/hr to ensure stays in range Monitor daily heparin level/APTT/CBC, s/sx bleeding   Leia Alf, PharmD, BCPS Please check AMION for all [facility] contact numbers Clinical  Pharmacist 11/10/2020 9:33 AM

## 2020-11-10 NOTE — ED Notes (Signed)
Per choitner, start amiodarone, after 15 minutes, stop cardizem

## 2020-11-10 NOTE — Progress Notes (Signed)
  Echocardiogram 2D Echocardiogram has been performed.  Gerda Diss 11/10/2020, 2:39 PM

## 2020-11-10 NOTE — ED Notes (Signed)
Jeffery Christian -- daughter -- [phone]

## 2020-11-10 NOTE — Progress Notes (Signed)
PROGRESS NOTE                                                                             PROGRESS NOTE                                                                                                                                                                                                             Patient Demographics:    Jeffery Christian, is a 70 y.o. male, DOB - 1950-10-20, WEX:[id number]  Outpatient Primary MD for the patient is Hoy Register, MD    LOS - 1  Admit date - 11/09/2020    Chief Complaint  Patient presents with  . Code Stroke       Brief Narrative     HPI: Jeffery Christian is a 70 y.o. male with medical history significant for atrial fibrillation on Eliquis-last dose unknown, dilated cardiomyopathy, chronic systolic CHF, CKD 3 who presents by EMS after being found on the floor at home by his friends.  He was last seen about 1 week ago by his friends.  His friends not heard from him in the last week so they went to his house to check on him and upon getting no response at his door, knocked the door down and found him down on the ground between a dresser in the bed.  He was not talking at the time. He was weak all over but the EMTs noted him to be weaker on the left in comparison to the right and brought to ER as a code stroke.  He was unable to provide any history. He was able to follow simple commands He had a strong smell of urine and EMTs reported urine and stool around him when they picked him up from his house.  ED Course: He was found to have elevated heart rate in the 1 60-1 80 range.  EKG showed atrial fibrillation with RVR.  He was started on Cardizem infusion in the emergency room but continues to have elevated heart rate.  CT the  head was negative for acute stroke.  Neurology was consulted and has evaluated patient.  Patient was found to be hypernatremic on labs with sodium of 168. Troponin was elevated at 56    Subjective:     Susanne Greenhouse today no significant events overnight, he is nonverbal and confused, but he denies any complaints .   Assessment  & Plan :    Principal Problem:   Atrial fibrillation with RVR (HCC) Active Problems:   Acute renal failure superimposed on stage 3 chronic kidney disease (HCC)   Acute metabolic encephalopathy   Hypernatremia   Elevated troponin   COVID-19 virus infection  Hypernatremia -Severe, due to hypovolemia, in the setting of volume depletion and dehydration. -Avoid rapid correction -Nephrology consult greatly appreciated, fluid management per renal, on IV normal saline and D5W, will follow on BMP this afternoon. -He remains n.p.o., awaiting SLP consult, may need core track cube with free water.  COVID-19 infection -Patient is unvaccinated as D/W daughter. -No evidence of pneumonia on x-ray, no  oxygen requirement - .Continue with remdesivir and steroids  AKI on CKD stage III: -Due to volume depletion and dehydration -Continue with aggressive IV hydration, received 2 L bolus this afternoon, he remains on 125 NS and 100 D5W. -CK within normal limit. -Acute on osmolality and urine sodium. -Renal ultrasound pending.  A fib with RVR -Heart rate remains significantly elevated despite being on amiodarone drip, now blood pressure has improved we will add Cardizem drip as well. -Can't take oral, so we will continue with heparin GTT for anticoagulation.  Hypertension:  -Blood pressure acceptable, monitor now he is on Cardizem drip .  Hyperkalemia:  -Improving  Acute metabolic encephalopathy: -MRI pending to rule out acute CVA. -as well in the  setting of hypernatremia - neurology consult appreciated    SpO2: 100 % O2 Flow Rate (L/min): 3 L/min  Recent Labs  Lab 11/09/20 2112 11/09/20 2113 11/09/20 2120 11/09/20 2242 11/10/20 0051 11/10/20 0804  WBC 10.5  --   --   --   --  9.4  PLT 397  --   --   --   --  318  CRP  --   --   --   --   --  2.7*   AST 32  --   --   --   --  21  ALT 28  --   --   --   --  25  ALKPHOS 72  --   --   --   --  62  BILITOT 2.5*  --   --   --   --  1.4*  ALBUMIN 2.9*  --   --   --   --  2.5*  INR  --   --   --  1.4*  --   --   LATICACIDVEN  --   --  2.4*  --  3.2*  --   SARSCOV2NAA  --  POSITIVE*  --   --   --   --        ABG  No results found for: PHART, PCO2ART, PO2ART, HCO3, TCO2, ACIDBASEDEF, O2SAT          Condition - Extremely Guarded  Family Communication  :  D/W daughter by phone  Code Status :  full  Consults  :  Renal, cardiology  Disposition Plan  :    Status is: Inpatient  Remains inpatient appropriate because:Hemodynamically unstable, Unsafe d/c plan and IV treatments appropriate  due to intensity of illness or inability to take PO   Dispo: The patient is from: Home              Anticipated d/c is to: SNF              Anticipated d/c date is: > 3 days              Patient currently is not medically stable to d/c.   Difficult to place patient Yes      DVT Prophylaxis  :   Heparin GTT  Lab Results  Component Value Date   PLT 318 11/10/2020    Diet :  Diet Order            Diet NPO time specified  Diet effective now                  Inpatient Medications  Scheduled Meds: . Chlorhexidine Gluconate Cloth  6 each Topical Daily  . methylPREDNISolone (SOLU-MEDROL) injection  1 mg/kg Intravenous Q12H   Followed by  . [START ON 11/13/2020] predniSONE  50 mg Oral Daily  . sodium chloride flush  3 mL Intravenous Once   Continuous Infusions: . sodium chloride 100 mL/hr at 11/10/20 0229  . amiodarone 30 mg/hr (11/10/20 1610)  . dextrose    . diltiazem (CARDIZEM) infusion Stopped (11/10/20 1528)  . heparin 1,400 Units/hr (11/10/20 1047)  . remdesivir 100 mg in NS 100 mL     PRN Meds:.acetaminophen, albuterol  Antibiotics  :    Anti-infectives (From admission, onward)   Start     Dose/Rate Route Frequency Ordered Stop   11/11/20 1000  remdesivir 100  mg in sodium chloride 0.9 % 100 mL IVPB  Status:  Discontinued       "Followed by" Linked Group Details   100 mg 200 mL/hr over 30 Minutes Intravenous Daily 11/10/20 0101 11/10/20 0103   11/10/20 1600  remdesivir 100 mg in sodium chloride 0.9 % 100 mL IVPB        100 mg 200 mL/hr over 30 Minutes Intravenous Daily 11/09/20 2346 11/14/20 0959   11/10/20 0000  remdesivir 200 mg in sodium chloride 0.9% 250 mL IVPB        200 mg 580 mL/hr over 30 Minutes Intravenous Once 11/09/20 2346 11/10/20 0441   11/09/20 2345  remdesivir 200 mg in sodium chloride 0.9% 250 mL IVPB  Status:  Discontinued       "Followed by" Linked Group Details   200 mg 580 mL/hr over 30 Minutes Intravenous Once 11/10/20 0101 11/10/20 0103       Tyller Bowlby M.D on 11/10/2020 at 3:37 PM  To page go to [email or web address]  [facility] -  Office  [phone]      Objective:   Vitals:   11/10/20 0615 11/10/20 0900 11/10/20 0954 11/10/20 1400  BP: 138/74 (!) 134/98 (!) 138/94 (!) 135/94  Pulse: 83 (!) 113 (!) 101 (!) 126  Resp: 18 13 13 18   Temp: (!) 96.5 F (35.8 C)  (!) 97.2 F (36.2 C) 98.3 F (36.8 C)  TempSrc: Temporal  Axillary Oral  SpO2: 100% 99% 100% 100%  Weight:      Height:        Wt Readings from Last 3 Encounters:  11/09/20 98.7 kg  09/08/20 98.7 kg  06/08/20 93.9 kg     Intake/Output Summary (Last 24 hours) at 11/10/2020 1537 Last data filed at 11/09/2020 2259 Gross per 24 hour  Intake  2.05 ml  Output -  Net 2.05 ml     Physical Exam  Patient is awake, follows simple commands, extremely frail and ill-appearing, ,appears with significant aphasia  Symmetrical Chest wall movement, Good air movement bilaterally, CTAB RRR,No Gallops,Rubs or new Murmurs, No Parasternal Heave +ve B.Sounds, Abd Soft, No tenderness No Cyanosis, Clubbing or edema, No new Rash or bruise      Data Review:    CBC Recent Labs  Lab 11/09/20 2112 11/10/20 0804  WBC 10.5 9.4  HGB 17.0 14.9  HCT  56.9* 50.7  PLT 397 318  MCV 91.8 92.5  MCH 27.4 27.2  MCHC 29.9* 29.4*  RDW 17.8* 16.8*  LYMPHSABS 1.5 0.6*  MONOABS 0.8 0.5  EOSABS 0.0 0.0  BASOSABS 0.0 0.0    Recent Labs  Lab 11/09/20 2112 11/09/20 2120 11/09/20 2242 11/10/20 0051 11/10/20 0804  NA 168*  --   --   --  166*  K 5.4*  --   --   --  4.6  CL 128*  --   --   --  >130*  CO2 21*  --   --   --  20*  GLUCOSE 157*  --   --   --  184*  BUN 101*  --   --   --  89*  CREATININE 3.10*  --   --   --  2.65*  CALCIUM 9.7  --   --   --  8.8*  AST 32  --   --   --  21  ALT 28  --   --   --  25  ALKPHOS 72  --   --   --  62  BILITOT 2.5*  --   --   --  1.4*  ALBUMIN 2.9*  --   --   --  2.5*  CRP  --   --   --   --  2.7*  LATICACIDVEN  --  2.4*  --  3.2*  --   INR  --   --  1.4*  --   --     ------------------------------------------------------------------------------------------------------------------ No results for input(s): CHOL, HDL, LDLCALC, TRIG, CHOLHDL, LDLDIRECT in the last 72 hours.  Lab Results  Component Value Date   HGBA1C 5.3 06/08/2020   ------------------------------------------------------------------------------------------------------------------ No results for input(s): TSH, T4TOTAL, T3FREE, THYROIDAB in the last 72 hours.  Invalid input(s): FREET3  Cardiac Enzymes No results for input(s): CKMB, TROPONINI, MYOGLOBIN in the last 168 hours.  Invalid input(s): CK ------------------------------------------------------------------------------------------------------------------    Component Value Date/Time   BNP 1,922.3 (H) 08/04/2017 2132    Micro Results Recent Results (from the past 240 hour(s))  Resp Panel by RT-PCR (Flu A&B, Covid) Nasopharyngeal Swab     Status: Abnormal   Collection Time: 11/09/20  9:13 PM   Specimen: Nasopharyngeal Swab; Nasopharyngeal(NP) swabs in vial transport medium  Result Value Ref Range Status   SARS Coronavirus 2 by RT PCR POSITIVE (A) NEGATIVE Final     Comment: RESULT CALLED TO, READ BACK BY AND VERIFIED WITH: Minus Breeding RN 11/09/20 2227 JDW (NOTE) SARS-CoV-2 target nucleic acids are DETECTED.  The SARS-CoV-2 RNA is generally detectable in upper respiratory specimens during the acute phase of infection. Positive results are indicative of the presence of the identified virus, but do not rule out bacterial infection or co-infection with other pathogens not detected by the test. Clinical correlation with patient history and other diagnostic information is necessary to determine patient infection status. The expected  result is Negative.  Fact Sheet for Patients: BloggerCourse.com  Fact Sheet for Healthcare Providers: SeriousBroker.it  This test is not yet approved or cleared by the [place] FDA and  has been authorized for detection and/or diagnosis of SARS-CoV-2 by FDA under an Emergency Use Authorization (EUA).  This EUA will remain in effect (meaning this test can be used)  for the duration of  the COVID-19 declaration under Section 564(b)(1) of the Act, 21 U.S.C. section 360bbb-3(b)(1), unless the authorization is terminated or revoked sooner.     Influenza A by PCR NEGATIVE NEGATIVE Final   Influenza B by PCR NEGATIVE NEGATIVE Final    Comment: (NOTE) The Xpert Xpress SARS-CoV-2/FLU/RSV plus assay is intended as an aid in the diagnosis of influenza from Nasopharyngeal swab specimens and should not be used as a sole basis for treatment. Nasal washings and aspirates are unacceptable for Xpert Xpress SARS-CoV-2/FLU/RSV testing.  Fact Sheet for Patients: BloggerCourse.com  Fact Sheet for Healthcare Providers: SeriousBroker.it  This test is not yet approved or cleared by the [place] FDA and has been authorized for detection and/or diagnosis of SARS-CoV-2 by FDA under an Emergency Use Authorization (EUA). This EUA will  remain in effect (meaning this test can be used) for the duration of the COVID-19 declaration under Section 564(b)(1) of the Act, 21 U.S.C. section 360bbb-3(b)(1), unless the authorization is terminated or revoked.  Performed at [facility], [address], [place], [place] [address]     Radiology Reports CT Code Stroke CTA Head W/WO contrast  Result Date: 11/09/2020 CLINICAL DATA:  Initial evaluation for acute stroke, left-sided weakness. EXAM: CT ANGIOGRAPHY HEAD AND NECK CT PERFUSION BRAIN TECHNIQUE: Multidetector CT imaging of the head and neck was performed using the standard protocol during bolus administration of intravenous contrast. Multiplanar CT image reconstructions and MIPs were obtained to evaluate the vascular anatomy. Carotid stenosis measurements (when applicable) are obtained utilizing NASCET criteria, using the distal internal carotid diameter as the denominator. Multiphase CT imaging of the brain was performed following IV bolus contrast injection. Subsequent parametric perfusion maps were calculated using RAPID software. CONTRAST:  100 cc of Omnipaque 350. COMPARISON:  Prior head CT from earlier the same day. FINDINGS: CTA NECK FINDINGS Aortic arch: Visualized aortic arch of normal caliber with normal branch pattern. No hemodynamically significant stenosis about the origin of the great vessels. Visualized subclavian arteries widely patent. Right carotid system: Right common and internal carotid arteries widely patent without stenosis, dissection or occlusion. Left carotid system: Left CCA patent from its origin to the bifurcation without stenosis. Mild eccentric soft plaque at the origin of the left ICA without significant stenosis. Left ICA patent distally without stenosis, dissection or occlusion. Vertebral arteries: Both vertebral arteries arise from the subclavian arteries. Dominant left vertebral artery with a diffusely hypoplastic right vertebral artery. Left vertebral  artery widely patent within the neck without stenosis or other abnormality. Severe stenosis at the origin of the right vertebral artery. Hypoplastic right vertebral artery otherwise patent within the neck without stenosis or other abnormality. Skeleton: No visible acute osseous abnormality, although cervical spine better evaluated on concomitant CT of the cervical spine. No discrete or worrisome osseous lesions. Other neck: No other acute soft tissue abnormality within the neck. No mass or adenopathy. Upper chest: Scattered atelectatic changes noted within the visualized lungs. Few scattered superimposed tree-in-bud nodular densities within the peripheral right upper lobe could reflect changes of acute bronchiolitis and/or mucoid impaction. No frank airspace consolidation. Review of  the MIP images confirms the above findings CTA HEAD FINDINGS Anterior circulation: Petrous, cavernous, and supraclinoid segments of both internal carotid arteries are widely patent without stenosis. A1 segments patent bilaterally. Normal anterior communicating artery. 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 Finding presumably chronic in nature given the chronic bilateral ACA territory infarcts seen on prior noncontrast head CT. Scant distal reconstitution, likely collateral. M1 segments patent bilaterally. Normal MCA bifurcations. Distal MCA branches well perfused and symmetric. Posterior circulation: Dominant left vertebral artery widely patent to the vertebrobasilar junction. Left PICA patent. Hypoplastic right vertebral artery grossly patent to the vertebrobasilar junction as well. Right PICA not visualized. Basilar patent to its distal aspect without stenosis. Superior cerebellar arteries patent bilaterally. Fetal type origin of the left PCA. Right PCA supplied via the basilar as well as a robust right posterior communicating artery. Scattered atheromatous irregularity within both PCAs without high-grade stenosis.  Short-segment mild right P2 stenosis noted (series 10, image 19). Venous sinuses: Not well assessed due to timing of the contrast bolus. Anatomic variants: Fetal type origin of the left PCA. Hypoplastic right vertebral artery. 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 Review of the MIP images confirms the above findings CT Brain Perfusion Findings: ASPECTS: 10 CBF (<30%) Volume: 0mL Perfusion (Tmax>6.0s) volume: 18mL Mismatch Volume: 18mL Infarction Location:Negative CT perfusion for acute ischemia. Delayed perfusion seen within the right ACA distribution, in keeping with the chronic ACA occlusion. IMPRESSION: CTA HEAD AND NECK IMPRESSION: 1. Negative CTA for emergent large vessel occlusion. 2. 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 3. Scattered mild for age atheromatous change elsewhere about the major arterial vasculature of the head and neck. No other hemodynamically significant or correctable stenosis. 4. Few scattered tree-in-bud nodular densities within right upper lobe, nonspecific, but could reflect changes of acute bronchiolitis and/or mucoid impaction. No frank airspace consolidation. CT PERFUSION IMPRESSION: 1. Negative CT perfusion for acute ischemia. 2. 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 Critical Value/emergent results were called by telephone at the time of interpretation on 11/09/2020 at 9:45 pm to provider [facility] LPSHISH ARORA , who verbally acknowledged these results. Electronically Signed   By: Rise MuBenjamin  McClintock M.D.   On: 11/09/2020 22:11   DG Pelvis 1-2 Views  Result Date: 11/09/2020 CLINICAL DATA:  Fall EXAM: PELVIS - 1-2 VIEW COMPARISON:  CT 03/22/2014 FINDINGS: Bones of the pelvis appear intact and congruent. Proximal femora intact and normally located within the acetabula. Moderate degenerative changes noted throughout the lower lumbar  spine, bilateral SI joints, and bilateral hips. Some mild left hip soft tissue swelling is present. Finding on a background of diffuse mild body wall edema. Some hyperdense likely excreted contrast media seen within the urinary bladder. Slightly lobular, crenulated bladder contours and possible diverticular outpouchings, can be seen in the setting chronic outlet obstruction particularly in this patient with a history of prostatomegaly seen on comparison pelvic CT. No other acute or suspicious soft tissue abnormalities. IMPRESSION: 1. Mild left hip soft tissue swelling without acute fracture or traumatic malalignment. 2. Moderate degenerative changes throughout the spine, bilateral SI joints and hips. 3. Bladder opacified by excreted contrast media. Slightly lobular, crenulated bladder contours and possible diverticular outpouchings, can be seen in the setting of chronic outlet obstruction particularly in this patient with a history of prostatomegaly. Electronically Signed   By: Kreg ShropshirePrice  DeHay M.D.   On: 11/09/2020 22:32   CT Code  Stroke CTA Neck W/WO contrast  Result Date: 11/09/2020 CLINICAL DATA:  Initial evaluation for acute stroke, left-sided weakness. EXAM: CT ANGIOGRAPHY HEAD AND NECK CT PERFUSION BRAIN TECHNIQUE: Multidetector CT imaging of the head and neck was performed using the standard protocol during bolus administration of intravenous contrast. Multiplanar CT image reconstructions and MIPs were obtained to evaluate the vascular anatomy. Carotid stenosis measurements (when applicable) are obtained utilizing NASCET criteria, using the distal internal carotid diameter as the denominator. Multiphase CT imaging of the brain was performed following IV bolus contrast injection. Subsequent parametric perfusion maps were calculated using RAPID software. CONTRAST:  100 cc of Omnipaque 350. COMPARISON:  Prior head CT from earlier the same day. FINDINGS: CTA NECK FINDINGS Aortic arch: Visualized aortic arch of  normal caliber with normal branch pattern. No hemodynamically significant stenosis about the origin of the great vessels. Visualized subclavian arteries widely patent. Right carotid system: Right common and internal carotid arteries widely patent without stenosis, dissection or occlusion. Left carotid system: Left CCA patent from its origin to the bifurcation without stenosis. Mild eccentric soft plaque at the origin of the left ICA without significant stenosis. Left ICA patent distally without stenosis, dissection or occlusion. Vertebral arteries: Both vertebral arteries arise from the subclavian arteries. Dominant left vertebral artery with a diffusely hypoplastic right vertebral artery. Left vertebral artery widely patent within the neck without stenosis or other abnormality. Severe stenosis at the origin of the right vertebral artery. Hypoplastic right vertebral artery otherwise patent within the neck without stenosis or other abnormality. Skeleton: No visible acute osseous abnormality, although cervical spine better evaluated on concomitant CT of the cervical spine. No discrete or worrisome osseous lesions. Other neck: No other acute soft tissue abnormality within the neck. No mass or adenopathy. Upper chest: Scattered atelectatic changes noted within the visualized lungs. Few scattered superimposed tree-in-bud nodular densities within the peripheral right upper lobe could reflect changes of acute bronchiolitis and/or mucoid impaction. No frank airspace consolidation. Review of the MIP images confirms the above findings CTA HEAD FINDINGS Anterior circulation: Petrous, cavernous, and supraclinoid segments of both internal carotid arteries are widely patent without stenosis. A1 segments patent bilaterally. Normal anterior communicating artery. 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 Finding presumably chronic in nature given the chronic bilateral ACA territory infarcts seen on prior noncontrast  head CT. Scant distal reconstitution, likely collateral. M1 segments patent bilaterally. Normal MCA bifurcations. Distal MCA branches well perfused and symmetric. Posterior circulation: Dominant left vertebral artery widely patent to the vertebrobasilar junction. Left PICA patent. Hypoplastic right vertebral artery grossly patent to the vertebrobasilar junction as well. Right PICA not visualized. Basilar patent to its distal aspect without stenosis. Superior cerebellar arteries patent bilaterally. Fetal type origin of the left PCA. Right PCA supplied via the basilar as well as a robust right posterior communicating artery. Scattered atheromatous irregularity within both PCAs without high-grade stenosis. Short-segment mild right P2 stenosis noted (series 10, image 19). Venous sinuses: Not well assessed due to timing of the contrast bolus. Anatomic variants: Fetal type origin of the left PCA. Hypoplastic right vertebral artery. 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 Review of the MIP images confirms the above findings CT Brain Perfusion Findings: ASPECTS: 10 CBF (<30%) Volume: 0mL Perfusion (Tmax>6.0s) volume: 18mL Mismatch Volume: 18mL Infarction Location:Negative CT perfusion for acute ischemia. Delayed perfusion seen within the right ACA distribution, in keeping with the chronic ACA occlusion. IMPRESSION: CTA HEAD AND NECK IMPRESSION: 1. Negative CTA for emergent large vessel occlusion. 2. Chronically occluded azygos  ACA, in keeping with the previously identified chronic bilateral ACA territory infarcts. 3. Scattered mild for age atheromatous change elsewhere about the major arterial vasculature of the head and neck. No other hemodynamically significant or correctable stenosis. 4. Few scattered tree-in-bud nodular densities within right upper lobe, nonspecific, but could reflect changes of acute bronchiolitis and/or mucoid impaction. No frank airspace consolidation. CT PERFUSION IMPRESSION: 1. Negative CT perfusion for acute  ischemia. 2. 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 Critical Value/emergent results were called by telephone at the time of interpretation on 11/09/2020 at 9:45 pm to provider [facility] , who verbally acknowledged these results. Electronically Signed   By: Rise Mu M.D.   On: 11/09/2020 22:11   CT C-SPINE NO CHARGE  Result Date: 11/09/2020 CLINICAL DATA:  Left-sided weakness EXAM: CT CERVICAL SPINE WITHOUT CONTRAST TECHNIQUE: Multidetector CT imaging of the cervical spine was performed without intravenous contrast. Multiplanar CT image reconstructions were also generated. COMPARISON:  None. FINDINGS: Alignment: Alignment is anatomic. Skull base and vertebrae: No acute fracture. No primary bone lesion or focal pathologic process. Soft tissues and spinal canal: Please refer to separately reported CT angiography of the neck for description of vascular findings. No prevertebral fluid or swelling. No visible canal hematoma. Disc levels: Bridging anterior osteophytes are seen throughout the entire cervical spine. Mild disc osteophyte complex at C4-5. No significant central canal or neural foraminal encroachment. Upper chest: Airway is patent. Lung apices are clear. Other: Reconstructed images confirm the above findings. IMPRESSION: 1. No acute cervical spine fracture. 2. Diffuse cervical spondylosis. No significant neural foraminal or central canal stenosis. Electronically Signed   By: Sharlet Salina M.D.   On: 11/09/2020 22:02   CT Code Stroke Cerebral Perfusion with contrast  Result Date: 11/09/2020 CLINICAL DATA:  Initial evaluation for acute stroke, left-sided weakness. EXAM: CT ANGIOGRAPHY HEAD AND NECK CT PERFUSION BRAIN TECHNIQUE: Multidetector CT imaging of the head and neck was performed using the standard protocol during bolus administration of intravenous contrast. Multiplanar CT image  reconstructions and MIPs were obtained to evaluate the vascular anatomy. Carotid stenosis measurements (when applicable) are obtained utilizing NASCET criteria, using the distal internal carotid diameter as the denominator. Multiphase CT imaging of the brain was performed following IV bolus contrast injection. Subsequent parametric perfusion maps were calculated using RAPID software. CONTRAST:  100 cc of Omnipaque 350. COMPARISON:  Prior head CT from earlier the same day. FINDINGS: CTA NECK FINDINGS Aortic arch: Visualized aortic arch of normal caliber with normal branch pattern. No hemodynamically significant stenosis about the origin of the great vessels. Visualized subclavian arteries widely patent. Right carotid system: Right common and internal carotid arteries widely patent without stenosis, dissection or occlusion. Left carotid system: Left CCA patent from its origin to the bifurcation without stenosis. Mild eccentric soft plaque at the origin of the left ICA without significant stenosis. Left ICA patent distally without stenosis, dissection or occlusion. Vertebral arteries: Both vertebral arteries arise from the subclavian arteries. Dominant left vertebral artery with a diffusely hypoplastic right vertebral artery. Left vertebral artery widely patent within the neck without stenosis or other abnormality. Severe stenosis at the origin of the right vertebral artery. Hypoplastic right vertebral artery otherwise patent within the neck without stenosis or other abnormality. Skeleton: No visible acute osseous abnormality, although cervical spine better evaluated on concomitant CT of the cervical spine. No discrete or worrisome osseous lesions. Other neck: No other acute  soft tissue abnormality within the neck. No mass or adenopathy. Upper chest: Scattered atelectatic changes noted within the visualized lungs. Few scattered superimposed tree-in-bud nodular densities within the peripheral right upper lobe could  reflect changes of acute bronchiolitis and/or mucoid impaction. No frank airspace consolidation. Review of the MIP images confirms the above findings CTA HEAD FINDINGS Anterior circulation: Petrous, cavernous, and supraclinoid segments of both internal carotid arteries are widely patent without stenosis. A1 segments patent bilaterally. Normal anterior communicating artery. 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 Finding presumably chronic in nature given the chronic bilateral ACA territory infarcts seen on prior noncontrast head CT. Scant distal reconstitution, likely collateral. M1 segments patent bilaterally. Normal MCA bifurcations. Distal MCA branches well perfused and symmetric. Posterior circulation: Dominant left vertebral artery widely patent to the vertebrobasilar junction. Left PICA patent. Hypoplastic right vertebral artery grossly patent to the vertebrobasilar junction as well. Right PICA not visualized. Basilar patent to its distal aspect without stenosis. Superior cerebellar arteries patent bilaterally. Fetal type origin of the left PCA. Right PCA supplied via the basilar as well as a robust right posterior communicating artery. Scattered atheromatous irregularity within both PCAs without high-grade stenosis. Short-segment mild right P2 stenosis noted (series 10, image 19). Venous sinuses: Not well assessed due to timing of the contrast bolus. Anatomic variants: Fetal type origin of the left PCA. Hypoplastic right vertebral artery. 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 Review of the MIP images confirms the above findings CT Brain Perfusion Findings: ASPECTS: 10 CBF (<30%) Volume: 50mL Perfusion (Tmax>6.0s) volume: 35mL Mismatch Volume: 13mL Infarction Location:Negative CT perfusion for acute ischemia. Delayed perfusion seen within the right ACA distribution, in keeping with the chronic ACA occlusion. IMPRESSION: CTA HEAD AND NECK IMPRESSION: 1. Negative CTA for emergent large vessel occlusion. 2.  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 3. Scattered mild for age atheromatous change elsewhere about the major arterial vasculature of the head and neck. No other hemodynamically significant or correctable stenosis. 4. Few scattered tree-in-bud nodular densities within right upper lobe, nonspecific, but could reflect changes of acute bronchiolitis and/or mucoid impaction. No frank airspace consolidation. CT PERFUSION IMPRESSION: 1. Negative CT perfusion for acute ischemia. 2. 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 Critical Value/emergent results were called by telephone at the time of interpretation on 11/09/2020 at 9:45 pm to provider [employer] , who verbally acknowledged these results. Electronically Signed   By: Rise Mu M.D.   On: 11/09/2020 22:11   DG Chest Port 1 View  Result Date: 11/09/2020 CLINICAL DATA:  Fall EXAM: PORTABLE CHEST 1 VIEW COMPARISON:  None. FINDINGS: The heart size and mediastinal contours are within normal limits. Both lungs are clear. The visualized skeletal structures are unremarkable. IMPRESSION: No active disease. Electronically Signed   By: Jonna Clark M.D.   On: 11/09/2020 22:38   CT HEAD CODE STROKE WO CONTRAST  Result Date: 11/09/2020 CLINICAL DATA:  Code stroke. Initial evaluation for acute left-sided weakness, stroke suspected. EXAM: CT HEAD WITHOUT CONTRAST TECHNIQUE: Contiguous axial images were obtained from the base of the skull through the vertex without intravenous contrast. COMPARISON:  None available. FINDINGS: Brain: Age-related cerebral atrophy with chronic small vessel ischemic disease. Remote lacunar infarct present at the left caudate. Remote bilateral ACA territory infarcts noted. No acute intracranial hemorrhage. No acute large vessel territory infarct. No mass lesion,  mass effect or midline shift.  Mild ventricular prominence related to global parenchymal volume loss without hydrocephalus. No extra-axial fluid collection. Vascular: No hyperdense vessel. Skull: Scalp soft tissues demonstrate no acute finding. Small lipoma noted at the posterior scalp. Calvarium intact. Sinuses/Orbits: Globes and orbital soft tissues within normal limits. Chronic frontal sinusitis noted. Scattered mucosal thickening noted elsewhere within the sphenoid ethmoidal sinuses. No mastoid effusion. Other: None. ASPECTS [facility] Stroke Program Early CT Score) - Ganglionic level infarction (caudate, lentiform nuclei, internal capsule, insula, M1-M3 cortex): 7 - Supraganglionic infarction (M4-M6 cortex): 3 Total score (0-10 with 10 being normal): 10 IMPRESSION: 1. No acute intracranial infarct or other abnormality. 2. ASPECTS is 10. 3. Remote bilateral ACA territory infarcts, with additional remote lacunar infarct at the left basal ganglia. 4. Underlying atrophy with chronic small vessel ischemic disease. These results were communicated to Dr. Wilford Corner at 9:31 pmon 2/8/2022by text page via the [facility] messaging system. Electronically Signed   By: Rise Mu M.D.   On: 11/09/2020 21:51

## 2020-11-10 NOTE — Progress Notes (Signed)
   11/10/20 0954  Assess: MEWS Score  Temp (!) 97.2 F (36.2 C)  BP (!) 138/94  Pulse Rate (!) 101  ECG Heart Rate (!) 130  Resp 13  SpO2 100 %  Assess: MEWS Score  MEWS Temp 0  MEWS Systolic 0  MEWS Pulse 3  MEWS RR 1  MEWS LOC 0  MEWS Score 4  MEWS Score Color Red  Assess: if the MEWS score is Yellow or Red  Were vital signs taken at a resting state? Yes  Focused Assessment No change from prior assessment  Early Detection of Sepsis Score *See Row Information* High  MEWS guidelines implemented *See Row Information* Yes  Treat  MEWS Interventions Escalated (See documentation below)  Pain Scale 0-10  Pain Score 0  Escalate  MEWS: Escalate Red: discuss with charge nurse/RN and provider, consider discussing with RRT  Notify: Charge Nurse/RN  Name of Charge Nurse/RN Notified elisa  Date Charge Nurse/RN Notified 11/10/20  Time Charge Nurse/RN Notified 1000  Notify: Provider  Provider Name/Title dawood  Date Provider Notified 11/10/20  Time Provider Notified 1000  Notification Type Face-to-face

## 2020-11-10 NOTE — ED Notes (Signed)
Pt has pulled out iv, monitoring equipment and pulling at foley. Mittens placed. New IV's established. Notified provider pt has been on Amio approx 1 hour (Cardizem d/c'd as ordered) and pt hr has maintained 125-150. New order for digoxin. Per neurologist, no rush on MRI, will stabilize HR prior to sending pt to MRI.

## 2020-11-11 ENCOUNTER — Inpatient Hospital Stay (HOSPITAL_COMMUNITY): Payer: Medicare Other

## 2020-11-11 ENCOUNTER — Inpatient Hospital Stay: Payer: Self-pay

## 2020-11-11 DIAGNOSIS — E870 Hyperosmolality and hypernatremia: Secondary | ICD-10-CM

## 2020-11-11 DIAGNOSIS — N183 Chronic kidney disease, stage 3 unspecified: Secondary | ICD-10-CM | POA: Diagnosis not present

## 2020-11-11 DIAGNOSIS — N179 Acute kidney failure, unspecified: Secondary | ICD-10-CM | POA: Diagnosis not present

## 2020-11-11 DIAGNOSIS — I4891 Unspecified atrial fibrillation: Secondary | ICD-10-CM | POA: Diagnosis not present

## 2020-11-11 LAB — COMPREHENSIVE METABOLIC PANEL
ALT: 24 U/L (ref 0–44)
AST: 24 U/L (ref 15–41)
Albumin: 2.3 g/dL — ABNORMAL LOW (ref 3.5–5.0)
Alkaline Phosphatase: 56 U/L (ref 38–126)
BUN: 86 mg/dL — ABNORMAL HIGH (ref 8–23)
CO2: 17 mmol/L — ABNORMAL LOW (ref 22–32)
Calcium: 8.6 mg/dL — ABNORMAL LOW (ref 8.9–10.3)
Chloride: >130 mmol/L (ref 98–111)
Creatinine, Ser: 2.46 mg/dL — ABNORMAL HIGH (ref 0.61–1.24)
GFR, Estimated: 28 mL/min — ABNORMAL LOW (ref >60)
Glucose, Bld: 212 mg/dL — ABNORMAL HIGH (ref 70–99)
Potassium: 3.8 mmol/L (ref 3.5–5.1)
Sodium: 163 mmol/L (ref 135–145)
Total Bilirubin: 1 mg/dL (ref 0.3–1.2)
Total Protein: 5.7 g/dL — ABNORMAL LOW (ref 6.5–8.1)

## 2020-11-11 LAB — CBC WITH DIFFERENTIAL/PLATELET
Abs Immature Granulocytes: 0.04 10*3/uL (ref 0.00–0.07)
Basophils Absolute: 0 10*3/uL (ref 0.0–0.1)
Basophils Relative: 0 %
Eosinophils Absolute: 0 10*3/uL (ref 0.0–0.5)
Eosinophils Relative: 0 %
HCT: 42.8 % (ref 39.0–52.0)
Hemoglobin: 12.8 g/dL — ABNORMAL LOW (ref 13.0–17.0)
Immature Granulocytes: 0 %
Lymphocytes Relative: 5 %
Lymphs Abs: 0.5 10*3/uL — ABNORMAL LOW (ref 0.7–4.0)
MCH: 27.4 pg (ref 26.0–34.0)
MCHC: 29.9 g/dL — ABNORMAL LOW (ref 30.0–36.0)
MCV: 91.6 fL (ref 80.0–100.0)
Monocytes Absolute: 0.4 10*3/uL (ref 0.1–1.0)
Monocytes Relative: 5 %
Neutro Abs: 8.2 10*3/uL — ABNORMAL HIGH (ref 1.7–7.7)
Neutrophils Relative %: 90 %
Platelets: 252 10*3/uL (ref 150–400)
RBC: 4.67 MIL/uL (ref 4.22–5.81)
RDW: 16.6 % — ABNORMAL HIGH (ref 11.5–15.5)
WBC: 9.1 10*3/uL (ref 4.0–10.5)
nRBC: 0 % (ref 0.0–0.2)

## 2020-11-11 LAB — BASIC METABOLIC PANEL
Anion gap: 11 (ref 5–15)
BUN: 74 mg/dL — ABNORMAL HIGH (ref 8–23)
BUN: 71 mg/dL — ABNORMAL HIGH (ref 8–23)
CO2: 17 mmol/L — ABNORMAL LOW (ref 22–32)
CO2: 14 mmol/L — ABNORMAL LOW (ref 22–32)
Calcium: 8.4 mg/dL — ABNORMAL LOW (ref 8.9–10.3)
Calcium: 8 mg/dL — ABNORMAL LOW (ref 8.9–10.3)
Chloride: >130 mmol/L (ref 98–111)
Chloride: 129 mmol/L — ABNORMAL HIGH (ref 98–111)
Creatinine, Ser: 2.26 mg/dL — ABNORMAL HIGH (ref 0.61–1.24)
Creatinine, Ser: 2.45 mg/dL — ABNORMAL HIGH (ref 0.61–1.24)
GFR, Estimated: 31 mL/min — ABNORMAL LOW (ref >60)
GFR, Estimated: 28 mL/min — ABNORMAL LOW (ref >60)
Glucose, Bld: 183 mg/dL — ABNORMAL HIGH (ref 70–99)
Glucose, Bld: 160 mg/dL — ABNORMAL HIGH (ref 70–99)
Potassium: 3.5 mmol/L (ref 3.5–5.1)
Potassium: 5.6 mmol/L — ABNORMAL HIGH (ref 3.5–5.1)
Sodium: 159 mmol/L — ABNORMAL HIGH (ref 135–145)
Sodium: 154 mmol/L — ABNORMAL HIGH (ref 135–145)

## 2020-11-11 LAB — GLUCOSE, CAPILLARY
Glucose-Capillary: 122 mg/dL — ABNORMAL HIGH (ref 70–99)
Glucose-Capillary: 111 mg/dL — ABNORMAL HIGH (ref 70–99)
Glucose-Capillary: 150 mg/dL — ABNORMAL HIGH (ref 70–99)
Glucose-Capillary: 152 mg/dL — ABNORMAL HIGH (ref 70–99)

## 2020-11-11 LAB — C-REACTIVE PROTEIN: CRP: 2.8 mg/dL — ABNORMAL HIGH (ref <1.0)

## 2020-11-11 LAB — HEPARIN LEVEL (UNFRACTIONATED)
Heparin Unfractionated: <0.1 IU/mL — ABNORMAL LOW (ref 0.30–0.70)
Heparin Unfractionated: <0.1 IU/mL — ABNORMAL LOW (ref 0.30–0.70)

## 2020-11-11 MED ORDER — SODIUM CHLORIDE 0.45 % IV SOLN: INTRAVENOUS | Status: DC

## 2020-11-11 MED ORDER — CEFTRIAXONE SODIUM 1 G IJ SOLR
1.0000 g | INTRAMUSCULAR | Status: AC
  Administered 2020-11-11 – 2020-11-15 (×5): 1 g via INTRAVENOUS
  Filled 2020-11-11 (×5): qty 10

## 2020-11-11 MED ORDER — SODIUM CHLORIDE 0.9% FLUSH
10.0000 mL | Freq: Two times a day (BID) | INTRAVENOUS | Status: DC
  Administered 2020-11-11 – 2020-11-16 (×8): 10 mL

## 2020-11-11 MED ORDER — PANTOPRAZOLE SODIUM 40 MG IV SOLR
40.0000 mg | INTRAVENOUS | Status: DC
  Administered 2020-11-11 – 2020-11-14 (×4): 40 mg via INTRAVENOUS
  Filled 2020-11-11 (×4): qty 40

## 2020-11-11 MED ORDER — SODIUM CHLORIDE 0.9% FLUSH
10.0000 mL | INTRAVENOUS | Status: DC | PRN
Start: 2020-11-11 — End: 2020-11-17
  Administered 2020-11-14: 10 mL

## 2020-11-11 MED ORDER — ENOXAPARIN SODIUM 100 MG/ML ~~LOC~~ SOLN
90.0000 mg | Freq: Two times a day (BID) | SUBCUTANEOUS | Status: DC
  Administered 2020-11-11 – 2020-11-15 (×8): 90 mg via SUBCUTANEOUS
  Filled 2020-11-11 (×10): qty 1

## 2020-11-11 NOTE — Progress Notes (Signed)
Went to patients room but was unable to get to patient due to him being at a procedure off the unit. Will attempt again as schedule permits.

## 2020-11-11 NOTE — Progress Notes (Signed)
ANTICOAGULATION CONSULT NOTE  Pharmacy Consult for heparin to Lovenox Indication: atrial fibrillation  No Known Allergies  Patient Measurements: Height: 6' (182.9 cm) Weight: 98.7 kg (217 lb 9.5 oz) IBW/kg (Calculated) : 77.6 Heparin Dosing Weight: 97.5 kg  Vital Signs: Temp: 98.2 F (36.8 C) (02/10 0743) Temp Source: Axillary (02/10 0743) BP: 142/84 (02/10 0743) Pulse Rate: 88 (02/10 0743)  Labs: Recent Labs    11/09/20 2112 11/09/20 2242 11/10/20 0057 11/10/20 0804 11/10/20 1508 11/10/20 2000 11/11/20 0035 11/11/20 0910  HGB 17.0  --   --  14.9  --   --  12.8*  --   HCT 56.9*  --   --  50.7  --   --  42.8  --   PLT 397  --   --  318  --   --  252  --   APTT  --  36  --  68*  --   --   --   --   LABPROT  --  16.5*  --   --   --   --   --   --   INR  --  1.4*  --   --   --   --   --   --   HEPARINUNFRC  --   --   --  0.59  --   --  <0.10* <0.10*  CREATININE 3.10*  --   --  2.65* 2.58* 2.55* 2.46*  --   CKTOTAL 185  --   --  175  --   --   --   --   TROPONINIHS 59*  --  55*  --   --   --   --   --     Estimated Creatinine Clearance: 35 mL/min (A) (by C-G formula based on SCr of 2.46 mg/dL (H)).   Medical History: Past Medical History:  Diagnosis Date  . Chronic systolic CHF (congestive heart failure) (HCC)    EF 35-40, diffuse HK, mild MR, moderate LAE, mild RAE, PASP 44, L pleural eff  . Dilated cardiomyopathy (HCC)    likely related to tachycardia  . Persistent atrial fibrillation (HCC)    Assessment: 69 yom on eliquis PTA presenting with AMS, currently continuing on heparin IV. Last dose eliquis unknown.  Heparin to Lovenox today  Goal of Therapy:   Monitor platelets by anticoagulation protocol: Yes   Plan:  DC Heparin and heparin labs Lovenox 90 mg sq Q 12 hours Watch Scr  Thank you Okey Regal, PharmD  Please check AMION for all [facility] contact numbers Clinical Pharmacist 11/11/2020 10:46 AM

## 2020-11-11 NOTE — Progress Notes (Signed)
PT Cancellation Note  Patient Details Name: Jeffery Christian MRN: [id number] DOB: 27-Oct-1950   Cancelled Treatment:    Reason Eval/Treat Not Completed: Fatigue/lethargy limiting ability to participate  Patient sleeping soundly and unable to arouse long enough to participate. Will attempt later today as schedule permits. Noted OT did work with pt earlier this a.m.   [name], PT Pager [phone]   [name] 11/11/2020, 11:43 AM

## 2020-11-11 NOTE — Progress Notes (Signed)
Occupational Therapy Evaluation   Pt admitted with the below listed diagnosis.  He presents to OT with generalized weakness, decreased activity tolerance, impaired balance, Lt UE weakness, and impaired cognition. He currently requires min A - total A for ADLs.  He reports he lives alone and was fully independent PTA.  Anticipate he will require SNF level rehab at discharge.    11/11/20 1750  OT Visit Information  Last OT Received On 11/11/20  Assistance Needed +2  History of Present Illness 70 y.o. male with medical history significant for atrial fibrillation on Eliquis-last dose unknown, dilated cardiomyopathy, chronic systolic CHF, CKD 3 who presents by EMS after being found on the floor at home by his friends.  He was last seen about 1 week ago by his friends. Left sided weakness, Afib with RVR, CT head negative, severe hypernatremia, hyperkalemia, metabolic encephalopathy; +COVID  Precautions  Precautions Fall  Home Living  Family/patient expects to be discharged to: Unsure  Living Arrangements Alone  Additional Comments pt unable to provide details  Prior Function  Level of Independence Independent  Comments with falls  Communication  Communication Expressive difficulties (low volume)  Pain Assessment  Pain Assessment No/denies pain  Cognition  Arousal/Alertness Awake/alert  Behavior During Therapy Impulsive;WFL for tasks assessed/performed  Overall Cognitive Status Impaired/Different from baseline  Area of Impairment Orientation;Attention;Following commands;Safety/judgement;Awareness;Problem solving  Orientation Level Disoriented to;Time;Situation;Place (hospital, but did not know which one)  Current Attention Level Sustained  Following Commands Follows one step commands consistently  Safety/Judgement Decreased awareness of deficits  Problem Solving Slow processing;Difficulty sequencing;Requires verbal cues;Requires tactile cues  Upper Extremity Assessment  Upper Extremity  Assessment LUE deficits/detail  LUE Deficits / Details Lt UE shoulder grossly 2-/5; elbow 3/5; wrist 2/5; finger extension 2/5; thumb 2/5; grip 2-/5 on command, but pt able to reach for OT's hand and pull himself forward wtih strong grip during that activity  LUE Coordination decreased fine motor;decreased gross motor  Lower Extremity Assessment  Lower Extremity Assessment Defer to PT evaluation  Cervical / Trunk Assessment  Cervical / Trunk Assessment Other exceptions  Cervical / Trunk Exceptions trunk weakness noted  ADL  Overall ADL's  Needs assistance/impaired  Eating/Feeding NPO  Grooming Wash/dry hands;Wash/dry face;Minimal assistance;Bed level  Upper Body Bathing Maximal assistance;Bed level  Lower Body Bathing Total assistance;Bed level  Upper Body Dressing  Maximal assistance;Sitting  Lower Body Dressing Total assistance;Bed level  Toilet Transfer Total assistance  Toilet Transfer Details (indicate cue type and reason) unable to safely perform  Toileting- Clothing Manipulation and Hygiene Total assistance;Bed level  Functional mobility during ADLs Total assistance  Vision- History  Patient Visual Report No change from baseline  Bed Mobility  Overal bed mobility Needs Assistance  Bed Mobility Supine to Sit;Sit to Supine  Supine to sit Max assist  Sit to supine Max assist  General bed mobility comments Pt requires assist with with moving LEs onto and off the bed and with lifting and lowering his trunk  Transfers  Overall transfer level Needs assistance  General transfer comment unable to safely attempt this date  Balance  Overall balance assessment Needs assistance  Sitting-balance support Feet supported;Bilateral upper extremity supported  Sitting balance-Leahy Scale Poor  Sitting balance - Comments requries max A for static sitting - heavy posterior lean  OT - End of Session  Activity Tolerance Patient tolerated treatment well  Patient left in bed;with call bell/phone  within reach;with bed alarm set  Nurse Communication Mobility status  OT Assessment  OT Recommendation/Assessment  Patient needs continued OT Services  OT Visit Diagnosis Unsteadiness on feet (R26.81);Muscle weakness (generalized) (M62.81);Cognitive communication deficit (R41.841)  OT Problem List Decreased strength;Decreased activity tolerance;Decreased range of motion;Impaired balance (sitting and/or standing);Decreased coordination;Decreased cognition;Decreased safety awareness;Decreased knowledge of use of DME or AE;Impaired UE functional use  OT Plan  OT Frequency (ACUTE ONLY) Min 2X/week  OT Treatment/Interventions (ACUTE ONLY) Self-care/ADL training;Therapeutic exercise;Neuromuscular education;DME and/or AE instruction;Therapeutic activities;Cognitive remediation/compensation;Patient/family education;Balance training  AM-PAC OT "6 Clicks" Daily Activity Outcome Measure (Version 2)  Help from another person eating meals? 1  Help from another person taking care of personal grooming? 3  Help from another person toileting, which includes using toliet, bedpan, or urinal? 1  Help from another person bathing (including washing, rinsing, drying)? 2  Help from another person to put on and taking off regular upper body clothing? 2  Help from another person to put on and taking off regular lower body clothing? 1  6 Click Score 10  OT Recommendation  Follow Up Recommendations SNF  OT Equipment None recommended by OT  Individuals Consulted  Consulted and Agree with Results and Recommendations Patient  Acute Rehab OT Goals  Patient Stated Goal to eat  OT Goal Formulation With patient  Time For Goal Achievement 11/25/20  Potential to Achieve Goals Good  OT Time Calculation  OT Start Time (ACUTE ONLY) 0937  OT Stop Time (ACUTE ONLY) 1025  OT Time Calculation (min) 48 min  OT General Charges  $OT Visit 1 Visit  OT Evaluation  $OT Eval Moderate Complexity 1 Mod  OT Treatments  $Self Care/Home  Management  8-22 mins  $Therapeutic Activity 8-22 mins  Written Expression  Dominant Hand Left  Eber Jones., OTR/L Acute Rehabilitation Services Pager [phone] Office [phone]

## 2020-11-11 NOTE — Progress Notes (Signed)
PROGRESS NOTE                                                                             PROGRESS NOTE                                                                                                                                                                                                             Patient Demographics:    Jeffery Christian, is a 70 y.o. male, DOB - [birth year], RUE:[id number]  Outpatient Primary MD for the patient is Hoy Register, MD    LOS - 2  Admit date - 11/09/2020    Chief Complaint  Patient presents with  . Code Stroke       Brief Narrative     HPI: Jeffery Christian is a 70 y.o. male with medical history significant for atrial fibrillation on Eliquis-last dose unknown, dilated cardiomyopathy, chronic systolic CHF, CKD 3 who presents by EMS after being found on the floor at home by his friends.  He was last seen about 1 week ago by his friends.  His friends not heard from him in the last week so they went to his house to check on him and upon getting no response at his door, knocked the door down and found him down on the ground between a dresser in the bed.  He was not talking at the time. He was weak all over but the EMTs noted him to be weaker on the left in comparison to the right and brought to ER as a code stroke.  He was unable to provide any history. He was able to follow simple commands He had a strong smell of urine and EMTs reported urine and stool around him when they picked him up from his house.  ED Course: He was found to have elevated heart rate in the 1 60-1 80 range.  EKG showed atrial fibrillation with RVR.  He was started on Cardizem infusion in the emergency room but continues to have elevated heart rate.  CT the  head was negative for acute stroke.  Neurology was consulted and has evaluated patient.  Patient was found to be hypernatremic on labs with sodium of 168. Troponin was elevated at 56    Subjective:     Jeffery Christian today denies any complaints today, but he remains confused, he had some confusion and agitation overnight where he required Haldol.     Assessment  & Plan :    Principal Problem:   Atrial fibrillation with RVR (HCC) Active Problems:   Acute renal failure superimposed on stage 3 chronic kidney disease (HCC)   Acute metabolic encephalopathy   Hypernatremia   Elevated troponin   COVID-19 virus infection  Hypernatremia -Severe, due to hypovolemia, in the setting of volume depletion and dehydration. -Avoid rapid correction, remains elevated at 163. -Nephrology consult greatly appreciated, he is currently on half-normal saline and D5W, will repeat labs this afternoon. -He remains n.p.o.  COVID-19 infection -Patient tells me he is vaccinated earlier last year, but not boosted -No evidence of pneumonia on x-ray, no  oxygen requirement - .Continue with remdesivir, I will discontinue his steroids  Acute metabolic encephalopathy: -Significantly improved -MRI incomplete study, no evidence of acute CVA -as well in the  setting of hypernatremia - neurology consult appreciated  AKI on CKD stage III: -Due to volume depletion and dehydration -Improving with IV fluids -CK within normal limit. -Acute on osmolality and urine sodium. -Renal ultrasound with no hydronephrosis  A fib with RVR -In amiodarone drip, remains poorly controlled, so have added Cardizem drip yesterday with better control, will discontinue amnio drip and keep only on Cardizem drip, will start some p.o. beta-blockers when able to take oral . -Cannot take oral, on heparin GTT, currently transitioned to full dose Lovenox   Hypertension:  -Blood pressure acceptable, monitor now he is on Cardizem drip .  Hyperkalemia:  -Improving    SpO2: 100 % O2 Flow Rate (L/min): 3 L/min  Recent Labs  Lab 11/09/20 2112 11/09/20 2113 11/09/20 2120 11/09/20 2242 11/10/20 0051 11/10/20 0804 11/10/20 1508  11/11/20 0035  WBC 10.5  --   --   --   --  9.4  --  9.1  PLT 397  --   --   --   --  318  --  252  CRP  --   --   --   --   --  2.7*  --  2.8*  AST 32  --   --   --   --  21  --  24  ALT 28  --   --   --   --  25  --  24  ALKPHOS 72  --   --   --   --  62  --  56  BILITOT 2.5*  --   --   --   --  1.4*  --  1.0  ALBUMIN 2.9*  --   --   --   --  2.5* 2.6* 2.3*  INR  --   --   --  1.4*  --   --   --   --   LATICACIDVEN  --   --  2.4*  --  3.2*  --   --   --   SARSCOV2NAA  --  POSITIVE*  --   --   --   --   --   --        ABG  No results found for: PHART, PCO2ART, PO2ART, HCO3, TCO2,  ACIDBASEDEF, O2SAT     Condition - Extremely Guarded  Family Communication  :  D/W daughter by phone 2/8,2/9  Code Status :  full  Consults  :  Renal, cardiology  Disposition Plan  :    Status is: Inpatient  Remains inpatient appropriate because:Hemodynamically unstable, Unsafe d/c plan and IV treatments appropriate due to intensity of illness or inability to take PO   Dispo: The patient is from: Home              Anticipated d/c is to: SNF              Anticipated d/c date is: > 3 days              Patient currently is not medically stable to d/c.   Difficult to place patient Yes      DVT Prophylaxis  :   Heparin GTT>> lovenox  Lab Results  Component Value Date   PLT 252 11/11/2020    Diet :  Diet Order            Diet NPO time specified  Diet effective now                  Inpatient Medications  Scheduled Meds: . Chlorhexidine Gluconate Cloth  6 each Topical Daily  . enoxaparin (LOVENOX) injection  90 mg Subcutaneous Q12H  . methylPREDNISolone (SOLU-MEDROL) injection  1 mg/kg Intravenous Q12H   Followed by  . [START ON 11/13/2020] predniSONE  50 mg Oral Daily   Continuous Infusions: . sodium chloride 125 mL/hr at 11/11/20 0836  . amiodarone 30 mg/hr (11/11/20 0834)  . cefTRIAXone (ROCEPHIN)  IV 1 g (11/11/20 0843)  . dextrose 75 mL/hr at 11/11/20 0626  .  diltiazem (CARDIZEM) infusion 7.5 mg/hr (11/11/20 0836)  . remdesivir 100 mg in NS 100 mL Stopped (11/11/20 0336)   PRN Meds:.acetaminophen, albuterol  Antibiotics  :    Anti-infectives (From admission, onward)   Start     Dose/Rate Route Frequency Ordered Stop   11/11/20 1000  remdesivir 100 mg in sodium chloride 0.9 % 100 mL IVPB  Status:  Discontinued       "Followed by" Linked Group Details   100 mg 200 mL/hr over 30 Minutes Intravenous Daily 11/10/20 0101 11/10/20 0103   11/11/20 0800  cefTRIAXone (ROCEPHIN) 1 g in sodium chloride 0.9 % 100 mL IVPB        1 g 200 mL/hr over 30 Minutes Intravenous Every 24 hours 11/11/20 0732     11/10/20 1600  remdesivir 100 mg in sodium chloride 0.9 % 100 mL IVPB        100 mg 200 mL/hr over 30 Minutes Intravenous Daily 11/09/20 2346 11/14/20 0959   11/10/20 0000  remdesivir 200 mg in sodium chloride 0.9% 250 mL IVPB        200 mg 580 mL/hr over 30 Minutes Intravenous Once 11/09/20 2346 11/10/20 0441   11/09/20 2345  remdesivir 200 mg in sodium chloride 0.9% 250 mL IVPB  Status:  Discontinued       "Followed by" Linked Group Details   200 mg 580 mL/hr over 30 Minutes Intravenous Once 11/10/20 0101 11/10/20 0103       Chanelle Hodsdon M.D on 11/11/2020 at 12:19 PM  To page go to [email or web address]  [facility] -  Office  [phone]      Objective:   Vitals:   11/11/20 0200 11/11/20 0400 11/11/20 0743 11/11/20 1159  BP: (!) 145/85  120/89 (!) 142/84 132/64  Pulse: 94 98 88 94  Resp: 11 14 13 14   Temp: 98 F (36.7 C) 98 F (36.7 C) 98.2 F (36.8 C) (!) 97.5 F (36.4 C)  TempSrc: Oral Oral Axillary Axillary  SpO2: 100% 100% 100% 100%  Weight:      Height:        Wt Readings from Last 3 Encounters:  11/09/20 98.7 kg  09/08/20 98.7 kg  06/08/20 93.9 kg     Intake/Output Summary (Last 24 hours) at 11/11/2020 1219 Last data filed at 11/11/2020 0800 Gross per 24 hour  Intake 3009.17 ml  Output 1900 ml  Net 1109.17 ml      Physical Exam  Patient is more awake, appropriate today, he is verbal today, more coherent as well, but remains confused, easily distracted. Symmetrical Chest wall movement, diminished air entry at the bases Regular regular, tachycardic,No Gallops,Rubs or new Murmurs, No Parasternal Heave +ve B.Sounds, Abd Soft, No tenderness, No rebound - guarding or rigidity. No Cyanosis, Clubbing ,has  trace  edema, No new Rash or bruise      Data Review:    CBC Recent Labs  Lab 11/09/20 2112 11/10/20 0804 11/11/20 0035  WBC 10.5 9.4 9.1  HGB 17.0 14.9 12.8*  HCT 56.9* 50.7 42.8  PLT 397 318 252  MCV 91.8 92.5 91.6  MCH 27.4 27.2 27.4  MCHC 29.9* 29.4* 29.9*  RDW 17.8* 16.8* 16.6*  LYMPHSABS 1.5 0.6* 0.5*  MONOABS 0.8 0.5 0.4  EOSABS 0.0 0.0 0.0  BASOSABS 0.0 0.0 0.0    Recent Labs  Lab 11/09/20 2112 11/09/20 2120 11/09/20 2242 11/10/20 0051 11/10/20 0804 11/10/20 1508 11/10/20 2000 11/11/20 0035  NA 168*  --   --   --  166* 167* 164* 163*  K 5.4*  --   --   --  4.6 4.1 4.0 3.8  CL 128*  --   --   --  >130* >130* >130* >130*  CO2 21*  --   --   --  20* 20* 17* 17*  GLUCOSE 157*  --   --   --  184* 152* 210* 212*  BUN 101*  --   --   --  89* 85* 85* 86*  CREATININE 3.10*  --   --   --  2.65* 2.58* 2.55* 2.46*  CALCIUM 9.7  --   --   --  8.8* 8.5* 8.3* 8.6*  AST 32  --   --   --  21  --   --  24  ALT 28  --   --   --  25  --   --  24  ALKPHOS 72  --   --   --  62  --   --  56  BILITOT 2.5*  --   --   --  1.4*  --   --  1.0  ALBUMIN 2.9*  --   --   --  2.5* 2.6*  --  2.3*  CRP  --   --   --   --  2.7*  --   --  2.8*  LATICACIDVEN  --  2.4*  --  3.2*  --   --   --   --   INR  --   --  1.4*  --   --   --   --   --     ------------------------------------------------------------------------------------------------------------------ No results for input(s): CHOL, HDL, LDLCALC, TRIG, CHOLHDL, LDLDIRECT in the  last 72 hours.  Lab Results  Component Value Date    HGBA1C 5.3 06/08/2020   ------------------------------------------------------------------------------------------------------------------ No results for input(s): TSH, T4TOTAL, T3FREE, THYROIDAB in the last 72 hours.  Invalid input(s): FREET3  Cardiac Enzymes No results for input(s): CKMB, TROPONINI, MYOGLOBIN in the last 168 hours.  Invalid input(s): CK ------------------------------------------------------------------------------------------------------------------    Component Value Date/Time   BNP 1,922.3 (H) 08/04/2017 2132    Micro Results Recent Results (from the past 240 hour(s))  Resp Panel by RT-PCR (Flu A&B, Covid) Nasopharyngeal Swab     Status: Abnormal   Collection Time: 11/09/20  9:13 PM   Specimen: Nasopharyngeal Swab; Nasopharyngeal(NP) swabs in vial transport medium  Result Value Ref Range Status   SARS Coronavirus 2 by RT PCR POSITIVE (A) NEGATIVE Final    Comment: RESULT CALLED TO, READ BACK BY AND VERIFIED WITH: Minus Breeding RN 11/09/20 2227 JDW (NOTE) SARS-CoV-2 target nucleic acids are DETECTED.  The SARS-CoV-2 RNA is generally detectable in upper respiratory specimens during the acute phase of infection. Positive results are indicative of the presence of the identified virus, but do not rule out bacterial infection or co-infection with other pathogens not detected by the test. Clinical correlation with patient history and other diagnostic information is necessary to determine patient infection status. The expected result is Negative.  Fact Sheet for Patients: BloggerCourse.com  Fact Sheet for Healthcare Providers: SeriousBroker.it  This test is not yet approved or cleared by the [place] FDA and  has been authorized for detection and/or diagnosis of SARS-CoV-2 by FDA under an Emergency Use Authorization (EUA).  This EUA will remain in effect (meaning this test can be used)  for the duration of  the  COVID-19 declaration under Section 564(b)(1) of the Act, 21 U.S.C. section 360bbb-3(b)(1), unless the authorization is terminated or revoked sooner.     Influenza A by PCR NEGATIVE NEGATIVE Final   Influenza B by PCR NEGATIVE NEGATIVE Final    Comment: (NOTE) The Xpert Xpress SARS-CoV-2/FLU/RSV plus assay is intended as an aid in the diagnosis of influenza from Nasopharyngeal swab specimens and should not be used as a sole basis for treatment. Nasal washings and aspirates are unacceptable for Xpert Xpress SARS-CoV-2/FLU/RSV testing.  Fact Sheet for Patients: BloggerCourse.com  Fact Sheet for Healthcare Providers: SeriousBroker.it  This test is not yet approved or cleared by the [place] FDA and has been authorized for detection and/or diagnosis of SARS-CoV-2 by FDA under an Emergency Use Authorization (EUA). This EUA will remain in effect (meaning this test can be used) for the duration of the COVID-19 declaration under Section 564(b)(1) of the Act, 21 U.S.C. section 360bbb-3(b)(1), unless the authorization is terminated or revoked.  Performed at [facility], [address], [place], [place] [address]     Radiology Reports CT Code Stroke CTA Head W/WO contrast  Result Date: 11/09/2020 CLINICAL DATA:  Initial evaluation for acute stroke, left-sided weakness. EXAM: CT ANGIOGRAPHY HEAD AND NECK CT PERFUSION BRAIN TECHNIQUE: Multidetector CT imaging of the head and neck was performed using the standard protocol during bolus administration of intravenous contrast. Multiplanar CT image reconstructions and MIPs were obtained to evaluate the vascular anatomy. Carotid stenosis measurements (when applicable) are obtained utilizing NASCET criteria, using the distal internal carotid diameter as the denominator. Multiphase CT imaging of the brain was performed following IV bolus contrast injection. Subsequent parametric perfusion maps  were calculated using RAPID software. CONTRAST:  100 cc of Omnipaque 350. COMPARISON:  Prior head CT from  earlier the same day. FINDINGS: CTA NECK FINDINGS Aortic arch: Visualized aortic arch of normal caliber with normal branch pattern. No hemodynamically significant stenosis about the origin of the great vessels. Visualized subclavian arteries widely patent. Right carotid system: Right common and internal carotid arteries widely patent without stenosis, dissection or occlusion. Left carotid system: Left CCA patent from its origin to the bifurcation without stenosis. Mild eccentric soft plaque at the origin of the left ICA without significant stenosis. Left ICA patent distally without stenosis, dissection or occlusion. Vertebral arteries: Both vertebral arteries arise from the subclavian arteries. Dominant left vertebral artery with a diffusely hypoplastic right vertebral artery. Left vertebral artery widely patent within the neck without stenosis or other abnormality. Severe stenosis at the origin of the right vertebral artery. Hypoplastic right vertebral artery otherwise patent within the neck without stenosis or other abnormality. Skeleton: No visible acute osseous abnormality, although cervical spine better evaluated on concomitant CT of the cervical spine. No discrete or worrisome osseous lesions. Other neck: No other acute soft tissue abnormality within the neck. No mass or adenopathy. Upper chest: Scattered atelectatic changes noted within the visualized lungs. Few scattered superimposed tree-in-bud nodular densities within the peripheral right upper lobe could reflect changes of acute bronchiolitis and/or mucoid impaction. No frank airspace consolidation. Review of the MIP images confirms the above findings CTA HEAD FINDINGS Anterior circulation: Petrous, cavernous, and supraclinoid segments of both internal carotid arteries are widely patent without stenosis. A1 segments patent bilaterally. Normal anterior  communicating artery. 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 Finding presumably chronic in nature given the chronic bilateral ACA territory infarcts seen on prior noncontrast head CT. Scant distal reconstitution, likely collateral. M1 segments patent bilaterally. Normal MCA bifurcations. Distal MCA branches well perfused and symmetric. Posterior circulation: Dominant left vertebral artery widely patent to the vertebrobasilar junction. Left PICA patent. Hypoplastic right vertebral artery grossly patent to the vertebrobasilar junction as well. Right PICA not visualized. Basilar patent to its distal aspect without stenosis. Superior cerebellar arteries patent bilaterally. Fetal type origin of the left PCA. Right PCA supplied via the basilar as well as a robust right posterior communicating artery. Scattered atheromatous irregularity within both PCAs without high-grade stenosis. Short-segment mild right P2 stenosis noted (series 10, image 19). Venous sinuses: Not well assessed due to timing of the contrast bolus. Anatomic variants: Fetal type origin of the left PCA. Hypoplastic right vertebral artery. 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 Review of the MIP images confirms the above findings CT Brain Perfusion Findings: ASPECTS: 10 CBF (<30%) Volume: 0mL Perfusion (Tmax>6.0s) volume: 18mL Mismatch Volume: 18mL Infarction Location:Negative CT perfusion for acute ischemia. Delayed perfusion seen within the right ACA distribution, in keeping with the chronic ACA occlusion. IMPRESSION: CTA HEAD AND NECK IMPRESSION: 1. Negative CTA for emergent large vessel occlusion. 2. 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 3. Scattered mild for age atheromatous change elsewhere about the major arterial vasculature of the head and neck. No other hemodynamically significant or correctable stenosis. 4. Few scattered tree-in-bud nodular densities within right upper  lobe, nonspecific, but could reflect changes of acute bronchiolitis and/or mucoid impaction. No frank airspace consolidation. CT PERFUSION IMPRESSION: 1. Negative CT perfusion for acute ischemia. 2. 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 Critical Value/emergent results were called by telephone at the time of interpretation on 11/09/2020 at 9:45 pm to provider [facility] , who  verbally acknowledged these results. Electronically Signed   By: Rise Mu M.D.   On: 11/09/2020 22:11   DG Pelvis 1-2 Views  Result Date: 11/09/2020 CLINICAL DATA:  Fall EXAM: PELVIS - 1-2 VIEW COMPARISON:  CT 03/22/2014 FINDINGS: Bones of the pelvis appear intact and congruent. Proximal femora intact and normally located within the acetabula. Moderate degenerative changes noted throughout the lower lumbar spine, bilateral SI joints, and bilateral hips. Some mild left hip soft tissue swelling is present. Finding on a background of diffuse mild body wall edema. Some hyperdense likely excreted contrast media seen within the urinary bladder. Slightly lobular, crenulated bladder contours and possible diverticular outpouchings, can be seen in the setting chronic outlet obstruction particularly in this patient with a history of prostatomegaly seen on comparison pelvic CT. No other acute or suspicious soft tissue abnormalities. IMPRESSION: 1. Mild left hip soft tissue swelling without acute fracture or traumatic malalignment. 2. Moderate degenerative changes throughout the spine, bilateral SI joints and hips. 3. Bladder opacified by excreted contrast media. Slightly lobular, crenulated bladder contours and possible diverticular outpouchings, can be seen in the setting of chronic outlet obstruction particularly in this patient with a history of prostatomegaly. Electronically Signed   By: Kreg Shropshire M.D.   On: 11/09/2020 22:32   DG Knee  1-2 Views Right  Result Date: 11/10/2020 CLINICAL DATA:  Found down, tender to palpation EXAM: RIGHT KNEE - 1-2 VIEW COMPARISON:  None. FINDINGS: Frontal and lateral views of the right knee demonstrate 3 compartmental osteoarthritis most severe in the medial compartment. No acute fracture, subluxation, or dislocation. No joint effusion. IMPRESSION: 1. Three compartmental osteoarthritis.  No acute fracture. Electronically Signed   By: Sharlet Salina M.D.   On: 11/10/2020 19:12   CT Code Stroke CTA Neck W/WO contrast  Result Date: 11/09/2020 CLINICAL DATA:  Initial evaluation for acute stroke, left-sided weakness. EXAM: CT ANGIOGRAPHY HEAD AND NECK CT PERFUSION BRAIN TECHNIQUE: Multidetector CT imaging of the head and neck was performed using the standard protocol during bolus administration of intravenous contrast. Multiplanar CT image reconstructions and MIPs were obtained to evaluate the vascular anatomy. Carotid stenosis measurements (when applicable) are obtained utilizing NASCET criteria, using the distal internal carotid diameter as the denominator. Multiphase CT imaging of the brain was performed following IV bolus contrast injection. Subsequent parametric perfusion maps were calculated using RAPID software. CONTRAST:  100 cc of Omnipaque 350. COMPARISON:  Prior head CT from earlier the same day. FINDINGS: CTA NECK FINDINGS Aortic arch: Visualized aortic arch of normal caliber with normal branch pattern. No hemodynamically significant stenosis about the origin of the great vessels. Visualized subclavian arteries widely patent. Right carotid system: Right common and internal carotid arteries widely patent without stenosis, dissection or occlusion. Left carotid system: Left CCA patent from its origin to the bifurcation without stenosis. Mild eccentric soft plaque at the origin of the left ICA without significant stenosis. Left ICA patent distally without stenosis, dissection or occlusion. Vertebral arteries:  Both vertebral arteries arise from the subclavian arteries. Dominant left vertebral artery with a diffusely hypoplastic right vertebral artery. Left vertebral artery widely patent within the neck without stenosis or other abnormality. Severe stenosis at the origin of the right vertebral artery. Hypoplastic right vertebral artery otherwise patent within the neck without stenosis or other abnormality. Skeleton: No visible acute osseous abnormality, although cervical spine better evaluated on concomitant CT of the cervical spine. No discrete or worrisome osseous lesions. Other neck: No other acute soft tissue abnormality within  the neck. No mass or adenopathy. Upper chest: Scattered atelectatic changes noted within the visualized lungs. Few scattered superimposed tree-in-bud nodular densities within the peripheral right upper lobe could reflect changes of acute bronchiolitis and/or mucoid impaction. No frank airspace consolidation. Review of the MIP images confirms the above findings CTA HEAD FINDINGS Anterior circulation: Petrous, cavernous, and supraclinoid segments of both internal carotid arteries are widely patent without stenosis. A1 segments patent bilaterally. Normal anterior communicating artery. 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 Finding presumably chronic in nature given the chronic bilateral ACA territory infarcts seen on prior noncontrast head CT. Scant distal reconstitution, likely collateral. M1 segments patent bilaterally. Normal MCA bifurcations. Distal MCA branches well perfused and symmetric. Posterior circulation: Dominant left vertebral artery widely patent to the vertebrobasilar junction. Left PICA patent. Hypoplastic right vertebral artery grossly patent to the vertebrobasilar junction as well. Right PICA not visualized. Basilar patent to its distal aspect without stenosis. Superior cerebellar arteries patent bilaterally. Fetal type origin of the left PCA. Right PCA supplied  via the basilar as well as a robust right posterior communicating artery. Scattered atheromatous irregularity within both PCAs without high-grade stenosis. Short-segment mild right P2 stenosis noted (series 10, image 19). Venous sinuses: Not well assessed due to timing of the contrast bolus. Anatomic variants: Fetal type origin of the left PCA. Hypoplastic right vertebral artery. 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 Review of the MIP images confirms the above findings CT Brain Perfusion Findings: ASPECTS: 10 CBF (<30%) Volume: 0mL Perfusion (Tmax>6.0s) volume: 18mL Mismatch Volume: 18mL Infarction Location:Negative CT perfusion for acute ischemia. Delayed perfusion seen within the right ACA distribution, in keeping with the chronic ACA occlusion. IMPRESSION: CTA HEAD AND NECK IMPRESSION: 1. Negative CTA for emergent large vessel occlusion. 2. 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 3. Scattered mild for age atheromatous change elsewhere about the major arterial vasculature of the head and neck. No other hemodynamically significant or correctable stenosis. 4. Few scattered tree-in-bud nodular densities within right upper lobe, nonspecific, but could reflect changes of acute bronchiolitis and/or mucoid impaction. No frank airspace consolidation. CT PERFUSION IMPRESSION: 1. Negative CT perfusion for acute ischemia. 2. 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 Critical Value/emergent results were called by telephone at the time of interpretation on 11/09/2020 at 9:45 pm to provider [facility] , who verbally acknowledged these results. Electronically Signed   By: Rise Mu M.D.   On: 11/09/2020 22:11   MR BRAIN WO CONTRAST  Result Date: 11/10/2020 CLINICAL DATA:  Stroke follow-up. EXAM: MRI HEAD WITHOUT CONTRAST TECHNIQUE: Multiplanar, multiecho pulse  sequences of the brain and surrounding structures were obtained without intravenous contrast. COMPARISON:  Head CT November 09, 2020. FINDINGS: Incomplete study due to patient inability to lie still in the scanner for the duration the study. Chronic bilateral ACA territory infarct. T2 hyperintensity within the periventricular white matter, nonspecific, most likely related to chronic small vessel ischemia. Brain: No acute infarction, hemorrhage, hydrocephalus, extra-axial collection or mass lesion. Vascular: Normal flow voids at the skull base. Sinuses/Orbits: Opacification of the bilateral frontal sinuses. The orbits are grossly unremarkable. IMPRESSION: 1. Incomplete study . 2. No acute intracranial abnormality identified. 3. Bilateral frontal sinus disease. Electronically Signed   By: Baldemar Lenis M.D.   On: 11/10/2020 18:00   US RENAL  Result Date: 11/10/2020 CLINICAL DATA:  Rule out hydronephrosis EXAM: RENAL /  URINARY TRACT ULTRASOUND COMPLETE COMPARISON:  None. FINDINGS: Right Kidney: Renal measurements: 8.7 x 5.1 x 4.7 cm = volume: 109 mL. Echogenicity within normal limits. No mass or hydronephrosis visualized. Left Kidney: Renal measurements: 10.0 x 4.7 x 4.6 cm = volume: 112 mL. Echogenicity within normal limits. No mass or hydronephrosis visualized. Bladder: Decompressed with Foley catheter in place. Other: None. IMPRESSION: No acute findings.  No hydronephrosis. Electronically Signed   By: Charlett Nose M.D.   On: 11/10/2020 21:40   CT C-SPINE NO CHARGE  Result Date: 11/09/2020 CLINICAL DATA:  Left-sided weakness EXAM: CT CERVICAL SPINE WITHOUT CONTRAST TECHNIQUE: Multidetector CT imaging of the cervical spine was performed without intravenous contrast. Multiplanar CT image reconstructions were also generated. COMPARISON:  None. FINDINGS: Alignment: Alignment is anatomic. Skull base and vertebrae: No acute fracture. No primary bone lesion or focal pathologic process. Soft tissues and  spinal canal: Please refer to separately reported CT angiography of the neck for description of vascular findings. No prevertebral fluid or swelling. No visible canal hematoma. Disc levels: Bridging anterior osteophytes are seen throughout the entire cervical spine. Mild disc osteophyte complex at C4-5. No significant central canal or neural foraminal encroachment. Upper chest: Airway is patent. Lung apices are clear. Other: Reconstructed images confirm the above findings. IMPRESSION: 1. No acute cervical spine fracture. 2. Diffuse cervical spondylosis. No significant neural foraminal or central canal stenosis. Electronically Signed   By: Sharlet Salina M.D.   On: 11/09/2020 22:02   CT Code Stroke Cerebral Perfusion with contrast  Result Date: 11/09/2020 CLINICAL DATA:  Initial evaluation for acute stroke, left-sided weakness. EXAM: CT ANGIOGRAPHY HEAD AND NECK CT PERFUSION BRAIN TECHNIQUE: Multidetector CT imaging of the head and neck was performed using the standard protocol during bolus administration of intravenous contrast. Multiplanar CT image reconstructions and MIPs were obtained to evaluate the vascular anatomy. Carotid stenosis measurements (when applicable) are obtained utilizing NASCET criteria, using the distal internal carotid diameter as the denominator. Multiphase CT imaging of the brain was performed following IV bolus contrast injection. Subsequent parametric perfusion maps were calculated using RAPID software. CONTRAST:  100 cc of Omnipaque 350. COMPARISON:  Prior head CT from earlier the same day. FINDINGS: CTA NECK FINDINGS Aortic arch: Visualized aortic arch of normal caliber with normal branch pattern. No hemodynamically significant stenosis about the origin of the great vessels. Visualized subclavian arteries widely patent. Right carotid system: Right common and internal carotid arteries widely patent without stenosis, dissection or occlusion. Left carotid system: Left CCA patent from its  origin to the bifurcation without stenosis. Mild eccentric soft plaque at the origin of the left ICA without significant stenosis. Left ICA patent distally without stenosis, dissection or occlusion. Vertebral arteries: Both vertebral arteries arise from the subclavian arteries. Dominant left vertebral artery with a diffusely hypoplastic right vertebral artery. Left vertebral artery widely patent within the neck without stenosis or other abnormality. Severe stenosis at the origin of the right vertebral artery. Hypoplastic right vertebral artery otherwise patent within the neck without stenosis or other abnormality. Skeleton: No visible acute osseous abnormality, although cervical spine better evaluated on concomitant CT of the cervical spine. No discrete or worrisome osseous lesions. Other neck: No other acute soft tissue abnormality within the neck. No mass or adenopathy. Upper chest: Scattered atelectatic changes noted within the visualized lungs. Few scattered superimposed tree-in-bud nodular densities within the peripheral right upper lobe could reflect changes of acute bronchiolitis and/or mucoid impaction. No frank airspace consolidation. Review of the MIP images  confirms the above findings CTA HEAD FINDINGS Anterior circulation: Petrous, cavernous, and supraclinoid segments of both internal carotid arteries are widely patent without stenosis. A1 segments patent bilaterally. Normal anterior communicating artery. 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 Finding presumably chronic in nature given the chronic bilateral ACA territory infarcts seen on prior noncontrast head CT. Scant distal reconstitution, likely collateral. M1 segments patent bilaterally. Normal MCA bifurcations. Distal MCA branches well perfused and symmetric. Posterior circulation: Dominant left vertebral artery widely patent to the vertebrobasilar junction. Left PICA patent. Hypoplastic right vertebral artery grossly patent to the  vertebrobasilar junction as well. Right PICA not visualized. Basilar patent to its distal aspect without stenosis. Superior cerebellar arteries patent bilaterally. Fetal type origin of the left PCA. Right PCA supplied via the basilar as well as a robust right posterior communicating artery. Scattered atheromatous irregularity within both PCAs without high-grade stenosis. Short-segment mild right P2 stenosis noted (series 10, image 19). Venous sinuses: Not well assessed due to timing of the contrast bolus. Anatomic variants: Fetal type origin of the left PCA. Hypoplastic right vertebral artery. 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 Review of the MIP images confirms the above findings CT Brain Perfusion Findings: ASPECTS: 10 CBF (<30%) Volume: 65mL Perfusion (Tmax>6.0s) volume: 57mL Mismatch Volume: 54mL Infarction Location:Negative CT perfusion for acute ischemia. Delayed perfusion seen within the right ACA distribution, in keeping with the chronic ACA occlusion. IMPRESSION: CTA HEAD AND NECK IMPRESSION: 1. Negative CTA for emergent large vessel occlusion. 2. 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 3. Scattered mild for age atheromatous change elsewhere about the major arterial vasculature of the head and neck. No other hemodynamically significant or correctable stenosis. 4. Few scattered tree-in-bud nodular densities within right upper lobe, nonspecific, but could reflect changes of acute bronchiolitis and/or mucoid impaction. No frank airspace consolidation. CT PERFUSION IMPRESSION: 1. Negative CT perfusion for acute ischemia. 2. 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 Critical Value/emergent results were called by telephone at the time of interpretation on 11/09/2020 at 9:45 pm to provider [facility] , who verbally acknowledged these results. Electronically  Signed   By: Rise Mu M.D.   On: 11/09/2020 22:11   DG Chest Port 1 View  Result Date: 11/09/2020 CLINICAL DATA:  Fall EXAM: PORTABLE CHEST 1 VIEW COMPARISON:  None. FINDINGS: The heart size and mediastinal contours are within normal limits. Both lungs are clear. The visualized skeletal structures are unremarkable. IMPRESSION: No active disease. Electronically Signed   By: Jonna Clark M.D.   On: 11/09/2020 22:38   DG HIP UNILAT WITH PELVIS 2-3 VIEWS RIGHT  Result Date: 11/10/2020 CLINICAL DATA:  Found down, right hip tenderness EXAM: DG HIP (WITH OR WITHOUT PELVIS) 2-3V RIGHT COMPARISON:  None. FINDINGS: Frontal view of the pelvis including both hips as well as frontal and frogleg lateral views of the right hip are obtained. No fracture, subluxation, or dislocation. Joint spaces are well preserved. Soft tissues are normal. IMPRESSION: 1. No acute displaced fracture. Electronically Signed   By: Sharlet Salina M.D.   On: 11/10/2020 19:09   CT HEAD CODE STROKE WO CONTRAST  Result Date: 11/09/2020 CLINICAL DATA:  Code stroke. Initial evaluation for acute left-sided weakness, stroke suspected. EXAM: CT HEAD WITHOUT CONTRAST TECHNIQUE: Contiguous axial images were obtained from the base of the skull through the vertex without intravenous contrast. COMPARISON:  None available. FINDINGS: Brain: Age-related  cerebral atrophy with chronic small vessel ischemic disease. Remote lacunar infarct present at the left caudate. Remote bilateral ACA territory infarcts noted. No acute intracranial hemorrhage. No acute large vessel territory infarct. No mass lesion, mass effect or midline shift. Mild ventricular prominence related to global parenchymal volume loss without hydrocephalus. No extra-axial fluid collection. Vascular: No hyperdense vessel. Skull: Scalp soft tissues demonstrate no acute finding. Small lipoma noted at the posterior scalp. Calvarium intact. Sinuses/Orbits: Globes and orbital soft tissues  within normal limits. Chronic frontal sinusitis noted. Scattered mucosal thickening noted elsewhere within the sphenoid ethmoidal sinuses. No mastoid effusion. Other: None. ASPECTS [facility] Stroke Program Early CT Score) - Ganglionic level infarction (caudate, lentiform nuclei, internal capsule, insula, M1-M3 cortex): 7 - Supraganglionic infarction (M4-M6 cortex): 3 Total score (0-10 with 10 being normal): 10 IMPRESSION: 1. No acute intracranial infarct or other abnormality. 2. ASPECTS is 10. 3. Remote bilateral ACA territory infarcts, with additional remote lacunar infarct at the left basal ganglia. 4. Underlying atrophy with chronic small vessel ischemic disease. These results were communicated to Dr. Wilford CornerArora at 9:31 pmon 2/8/2022by text page via the [facility] messaging system. Electronically Signed   By: Rise MuBenjamin  McClintock M.D.   On: 11/09/2020 21:51   ECHOCARDIOGRAM LIMITED  Result Date: 11/10/2020    ECHOCARDIOGRAM LIMITED REPORT   Patient Name:   Jeffery Christian  Date of Exam: 11/10/2020 Medical Rec #:  [id number]  Height:       72.0 in Accession #:    [id number] Weight:       217.6 lb Date of Birth:  Feb 07, 1951 BSA:          2.208 m Patient Age:    69 years   BP:           138/94 mmHg Patient Gender: M          HR:           138 bpm. Exam Location:  Inpatient Procedure: Limited Echo, Cardiac Doppler and Color Doppler Indications:    Atrial fibrillation  History:        Patient has prior history of Echocardiogram examinations, most                 recent 07/19/2017. CHF; Arrythmias:Atrial Fibrillation. Dilated                 cardiomyopathy. CKD.  Sonographer:    Ross LudwigArthur Guy RDCS (AE) Referring Phys: [id number] [facility]  Sonographer Comments: Suboptimal parasternal window. IMPRESSIONS  1. Left ventricular ejection fraction, by estimation, is 60 to 65%. The left ventricle has normal function. The left ventricle has no regional wall motion abnormalities. There is moderate left ventricular hypertrophy. Left  ventricular diastolic parameters are indeterminate.  2. Right ventricular systolic function is normal. The right ventricular size is normal.  3. Left atrial size was mildly dilated.  4. The mitral valve is normal in structure. Trivial mitral valve regurgitation. No evidence of mitral stenosis.  5. The aortic valve was not well visualized. Aortic valve regurgitation is not visualized. No aortic stenosis is present.  6. The inferior vena cava is dilated in size with >50% respiratory variability, suggesting right atrial pressure of 8 mmHg. FINDINGS  Left Ventricle: Left ventricular ejection fraction, by estimation, is 60 to 65%. The left ventricle has normal function. The left ventricle has no regional wall motion abnormalities. The left ventricular internal cavity size was normal in size. There is  moderate left ventricular hypertrophy. Left ventricular diastolic parameters are indeterminate.  Right Ventricle: The right ventricular size is normal. No increase in right ventricular wall thickness. Right ventricular systolic function is normal. Left Atrium: Left atrial size was mildly dilated. Right Atrium: Right atrial size was normal in size. Pericardium: There is no evidence of pericardial effusion. Mitral Valve: The mitral valve is normal in structure. Trivial mitral valve regurgitation. No evidence of mitral valve stenosis. Tricuspid Valve: The tricuspid valve is normal in structure. Tricuspid valve regurgitation is mild . No evidence of tricuspid stenosis. Aortic Valve: The aortic valve was not well visualized. Aortic valve regurgitation is not visualized. No aortic stenosis is present. Pulmonic Valve: The pulmonic valve was normal in structure. Pulmonic valve regurgitation is not visualized. No evidence of pulmonic stenosis. Aorta: The aortic root is normal in size and structure. Venous: The inferior vena cava is dilated in size with greater than 50% respiratory variability, suggesting right atrial pressure of 8  mmHg. IAS/Shunts: No atrial level shunt detected by color flow Doppler. LEFT VENTRICLE PLAX 2D LVIDd:         4.80 cm LVIDs:         3.40 cm LV PW:         1.60 cm LV IVS:        1.50 cm LVOT diam:     2.10 cm LVOT Area:     3.46 cm  IVC IVC diam: 1.70 cm LEFT ATRIUM         Index LA diam:    3.90 cm 1.77 cm/m   AORTA Ao Root diam: 3.60 cm  SHUNTS Systemic Diam: 2.10 cm Charlton Haws MD Electronically signed by Charlton Haws MD Signature Date/Time: 11/10/2020/3:42:53 PM    Final    [place] EKG SITE RITE  Result Date: 11/11/2020 If Site Rite image not attached, placement could not be confirmed due to current cardiac rhythm.

## 2020-11-11 NOTE — Progress Notes (Signed)
Peripherally Inserted Central Catheter Placement  The IV Nurse has discussed with the patient and/or persons authorized to consent for the patient, the purpose of this procedure and the potential benefits and risks involved with this procedure.  The benefits include less needle sticks, lab draws from the catheter, and the patient may be discharged home with the catheter. Risks include, but not limited to, infection, bleeding, blood clot (thrombus formation), and puncture of an artery; nerve damage and irregular heartbeat and possibility to perform a PICC exchange if needed/ordered by physician.  Alternatives to this procedure were also discussed.  Bard Power PICC patient education guide, fact sheet on infection prevention and patient information card has been provided to patient /or left at bedside.  Phone consent obtain from daughter [place].  PICC Placement Documentation  PICC Triple Lumen 11/11/20 PICC Left Basilic 47 cm 0 cm (Active)  Indication for Insertion or Continuance of Line Vasoactive infusions 11/11/20 1239  Exposed Catheter (cm) 0 cm 11/11/20 1239  Site Assessment Clean;Dry;Intact 11/11/20 1239  Lumen #1 Status Flushed;Saline locked;Blood return noted 11/11/20 1239  Lumen #2 Status Flushed;Saline locked;Blood return noted 11/11/20 1239  Lumen #3 Status Flushed;Saline locked;Blood return noted 11/11/20 1239  Dressing Type Transparent 11/11/20 1239  Dressing Status Clean;Intact;Dry 11/11/20 1239  Antimicrobial disc in place? Yes 11/11/20 1239  Dressing Intervention New dressing 11/11/20 1239  Dressing Change Due 11/18/20 11/11/20 1239       Ethelda Chick 11/11/2020, 12:40 PM

## 2020-11-11 NOTE — Progress Notes (Addendum)
Modified Barium Swallow Progress Note  Patient Details  Name: Jeffery Christian MRN: [id number] Date of Birth: [birth year]  Today's Date: 11/11/2020  Modified Barium Swallow completed.  Full report located under Chart Review in the Imaging Section.  Brief recommendations include the following:  Clinical Impression  Cervical osteophytes were noted, but did not significantly impact swallow function. Pt demonstrated decreased bolus cohesion, a pharyngeal delay, and incomplete epiglottic inverversion with the initial bolus of thin liquids via cup which was administered by the SLP. Premature spillage was noted to the pyriform sinuses and aspiration (PAS 7) noted thereafter secondary to the pharyngeal delay and incomplete epiglottic inversion. Coughing was noted and effective in expelling some of the aspirated material. Subsequent swallows were significant for prolonged mastication, and mildly reduced lingual retraction, but his swallow was notably more timely with self-feeding and no subsequent instances of penetration or aspiration were noted even when challenged with large sips and consecutive swallows. Mild to moderate vallecular residue was demonstrated with solids and was reduced to a functional level with a liquid wash. A dysphagia 3 diet with thin liquids is recommended at this time. Pt's swallow function and associated aspiration risk are much improved with self-feeding and it is recommended that this be encouraged with supervision. SLP will follow to ensure diet tolerance.   Swallow Evaluation Recommendations       SLP Diet Recommendations: Dysphagia 3 (Mech soft) solids;Thin liquid   Liquid Administration via: Cup;Straw   Medication Administration: Whole meds with liquid   Supervision: Full supervision/cueing for compensatory strategies;Patient able to self feed (Encourage self-feeding as much as possible)   Compensations: Slow rate;Small sips/bites   Postural Changes: Remain semi-upright  after after feeds/meals (Comment);Seated upright at 90 degrees   Oral Care Recommendations: Oral care BID      Ikran Patman I. Vear Clock, MS, CCC-SLP Acute Rehabilitation Services Office number [phone] Pager [phone]  Scheryl Marten 11/11/2020,3:48 PM

## 2020-11-11 NOTE — Progress Notes (Addendum)
[facility] Progress Note    Assessment/ Plan:    Hypovolemic hypernatremia: Initial sodium level of 168, improved to 163.    Started on half normal saline at 125/h, continues on D5 at 75 mL/h. Will likely reduce D5 to 72ml per hour after discussion with attending.  I's and O's show 1.8 L up since admission.  Physical exam does show patient is still fluid down with dry mucous membranes and no edema in lower extremities.  Patient is able to speak some words today which seems improved from yesterday and states that he really wants to drink fluids.  Swallow study is pending. -Fluids as above -Avoid rapid correction patient sodium -Close monitoring of patient's sodium level.  AKI on CKD stage III: Baseline creatinine appears to be ~1.4.  Creatinine on presentation of 3.10.  Currently improved at 2.46.  BUN elevated at 86.  Patient to 1.8 L up since admission, still appears fluid down on exam.  Continue fluids as above.  CK level 175. -Continue to monitor daily Cr, -Monitor Daily I/Os, Daily weight  -Avoid nephrotoxic medications -We will check urine osmolality and urine sodium  Hypertension: Patient with some elevated blood pressure since arrival.  Now with most recent blood pressure 142/84.  Blood pressure range of 120-145/84-94 over last 5 hours.  Hyperkalemia: Initial potassium of 5.4.  Improved to 3.8 after fluid resuscitation  Atrial fib with RVR: Placed on Cardizem drip in the emergency department which followed by amiodarone infusion. Continues on amiodarone infusion.  Current heart rate of 100. Per primary  Acute metabolic encephalopathy: neurology consulted by primary team.  MRI completed and shows no acute intracranial abnormality, however study was incomplete due to patient's inability to lie still.  Per primary team/neurology.  COVID-19 infection-Per primary  Subjective:   Patient denies pains at this time.  He is able to speak a few words and states that he  wants to drink something.  I explained to him that a swallow study is currently pending because of his altered mental status and patient nods in understanding.  I explained that we are currently addressing his electrolyte abnormalities with fluids and he also nods in understanding.   Objective:   BP (!) 142/84 (BP Location: Left Arm)    Pulse 88    Temp 98.2 F (36.8 C) (Axillary)    Resp 13    Ht 6' (1.829 m)    Wt 98.7 kg    SpO2 100%    BMI 29.51 kg/m   Intake/Output Summary (Last 24 hours) at 11/11/2020 4287 Last data filed at 11/11/2020 6811 Gross per 24 hour  Intake 3009.17 ml  Output 1200 ml  Net 1809.17 ml   Weight change:   Physical Exam: General: Elderly male lying in bed, dry mucous membranes present Heart: Regular rate and rhythm with no murmurs appreciated Lungs: CTA bilaterally, no wheezing, no crackles Abdomen: Bowel sounds present, no abdominal pain Extremities: No lower extremity edema  Imaging: CT Code Stroke CTA Head W/WO contrast  Result Date: 11/09/2020 CLINICAL DATA:  Initial evaluation for acute stroke, left-sided weakness. EXAM: CT ANGIOGRAPHY HEAD AND NECK CT PERFUSION BRAIN TECHNIQUE: Multidetector CT imaging of the head and neck was performed using the standard protocol during bolus administration of intravenous contrast. Multiplanar CT image reconstructions and MIPs were obtained to evaluate the vascular anatomy. Carotid stenosis measurements (when applicable) are obtained utilizing NASCET criteria, using the distal internal carotid diameter as the denominator. Multiphase CT imaging of the brain was performed  following IV bolus contrast injection. Subsequent parametric perfusion maps were calculated using RAPID software. CONTRAST:  100 cc of Omnipaque 350. COMPARISON:  Prior head CT from earlier the same day. FINDINGS: CTA NECK FINDINGS Aortic arch: Visualized aortic arch of normal caliber with normal branch pattern. No hemodynamically significant stenosis about the  origin of the great vessels. Visualized subclavian arteries widely patent. Right carotid system: Right common and internal carotid arteries widely patent without stenosis, dissection or occlusion. Left carotid system: Left CCA patent from its origin to the bifurcation without stenosis. Mild eccentric soft plaque at the origin of the left ICA without significant stenosis. Left ICA patent distally without stenosis, dissection or occlusion. Vertebral arteries: Both vertebral arteries arise from the subclavian arteries. Dominant left vertebral artery with a diffusely hypoplastic right vertebral artery. Left vertebral artery widely patent within the neck without stenosis or other abnormality. Severe stenosis at the origin of the right vertebral artery. Hypoplastic right vertebral artery otherwise patent within the neck without stenosis or other abnormality. Skeleton: No visible acute osseous abnormality, although cervical spine better evaluated on concomitant CT of the cervical spine. No discrete or worrisome osseous lesions. Other neck: No other acute soft tissue abnormality within the neck. No mass or adenopathy. Upper chest: Scattered atelectatic changes noted within the visualized lungs. Few scattered superimposed tree-in-bud nodular densities within the peripheral right upper lobe could reflect changes of acute bronchiolitis and/or mucoid impaction. No frank airspace consolidation. Review of the MIP images confirms the above findings CTA HEAD FINDINGS Anterior circulation: Petrous, cavernous, and supraclinoid segments of both internal carotid arteries are widely patent without stenosis. A1 segments patent bilaterally. Normal anterior communicating artery. 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 Finding presumably chronic in nature given the chronic bilateral ACA territory infarcts seen on prior noncontrast head CT. Scant distal reconstitution, likely collateral. M1 segments patent bilaterally. Normal  MCA bifurcations. Distal MCA branches well perfused and symmetric. Posterior circulation: Dominant left vertebral artery widely patent to the vertebrobasilar junction. Left PICA patent. Hypoplastic right vertebral artery grossly patent to the vertebrobasilar junction as well. Right PICA not visualized. Basilar patent to its distal aspect without stenosis. Superior cerebellar arteries patent bilaterally. Fetal type origin of the left PCA. Right PCA supplied via the basilar as well as a robust right posterior communicating artery. Scattered atheromatous irregularity within both PCAs without high-grade stenosis. Short-segment mild right P2 stenosis noted (series 10, image 19). Venous sinuses: Not well assessed due to timing of the contrast bolus. Anatomic variants: Fetal type origin of the left PCA. Hypoplastic right vertebral artery. 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 Review of the MIP images confirms the above findings CT Brain Perfusion Findings: ASPECTS: 10 CBF (<30%) Volume: 0mL Perfusion (Tmax>6.0s) volume: 18mL Mismatch Volume: 18mL Infarction Location:Negative CT perfusion for acute ischemia. Delayed perfusion seen within the right ACA distribution, in keeping with the chronic ACA occlusion. IMPRESSION: CTA HEAD AND NECK IMPRESSION: 1. Negative CTA for emergent large vessel occlusion. 2. 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 3. Scattered mild for age atheromatous change elsewhere about the major arterial vasculature of the head and neck. No other hemodynamically significant or correctable stenosis. 4. Few scattered tree-in-bud nodular densities within right upper lobe, nonspecific, but could reflect changes of acute bronchiolitis and/or mucoid impaction. No frank airspace consolidation. CT PERFUSION IMPRESSION: 1. Negative CT perfusion for acute ischemia. 2. Delayed perfusion within the right ACA distribution, in keeping with the chronic azygos  ACA occlusion  and chronic ACA territory infarcts. Critical Value/emergent results were called by telephone at the time of interpretation on 11/09/2020 at 9:45 pm to provider [facility] , who verbally acknowledged these results. Electronically Signed   By: Rise Mu M.D.   On: 11/09/2020 22:11   DG Pelvis 1-2 Views  Result Date: 11/09/2020 CLINICAL DATA:  Fall EXAM: PELVIS - 1-2 VIEW COMPARISON:  CT 03/22/2014 FINDINGS: Bones of the pelvis appear intact and congruent. Proximal femora intact and normally located within the acetabula. Moderate degenerative changes noted throughout the lower lumbar spine, bilateral SI joints, and bilateral hips. Some mild left hip soft tissue swelling is present. Finding on a background of diffuse mild body wall edema. Some hyperdense likely excreted contrast media seen within the urinary bladder. Slightly lobular, crenulated bladder contours and possible diverticular outpouchings, can be seen in the setting chronic outlet obstruction particularly in this patient with a history of prostatomegaly seen on comparison pelvic CT. No other acute or suspicious soft tissue abnormalities. IMPRESSION: 1. Mild left hip soft tissue swelling without acute fracture or traumatic malalignment. 2. Moderate degenerative changes throughout the spine, bilateral SI joints and hips. 3. Bladder opacified by excreted contrast media. Slightly lobular, crenulated bladder contours and possible diverticular outpouchings, can be seen in the setting of chronic outlet obstruction particularly in this patient with a history of prostatomegaly. Electronically Signed   By: Kreg Shropshire M.D.   On: 11/09/2020 22:32   DG Knee 1-2 Views Right  Result Date: 11/10/2020 CLINICAL DATA:  Found down, tender to palpation EXAM: RIGHT KNEE - 1-2 VIEW COMPARISON:  None. FINDINGS: Frontal and lateral views of the right knee demonstrate 3 compartmental osteoarthritis most severe in the medial compartment. No acute  fracture, subluxation, or dislocation. No joint effusion. IMPRESSION: 1. Three compartmental osteoarthritis.  No acute fracture. Electronically Signed   By: Sharlet Salina M.D.   On: 11/10/2020 19:12   CT Code Stroke CTA Neck W/WO contrast  Result Date: 11/09/2020 CLINICAL DATA:  Initial evaluation for acute stroke, left-sided weakness. EXAM: CT ANGIOGRAPHY HEAD AND NECK CT PERFUSION BRAIN TECHNIQUE: Multidetector CT imaging of the head and neck was performed using the standard protocol during bolus administration of intravenous contrast. Multiplanar CT image reconstructions and MIPs were obtained to evaluate the vascular anatomy. Carotid stenosis measurements (when applicable) are obtained utilizing NASCET criteria, using the distal internal carotid diameter as the denominator. Multiphase CT imaging of the brain was performed following IV bolus contrast injection. Subsequent parametric perfusion maps were calculated using RAPID software. CONTRAST:  100 cc of Omnipaque 350. COMPARISON:  Prior head CT from earlier the same day. FINDINGS: CTA NECK FINDINGS Aortic arch: Visualized aortic arch of normal caliber with normal branch pattern. No hemodynamically significant stenosis about the origin of the great vessels. Visualized subclavian arteries widely patent. Right carotid system: Right common and internal carotid arteries widely patent without stenosis, dissection or occlusion. Left carotid system: Left CCA patent from its origin to the bifurcation without stenosis. Mild eccentric soft plaque at the origin of the left ICA without significant stenosis. Left ICA patent distally without stenosis, dissection or occlusion. Vertebral arteries: Both vertebral arteries arise from the subclavian arteries. Dominant left vertebral artery with a diffusely hypoplastic right vertebral artery. Left vertebral artery widely patent within the neck without stenosis or other abnormality. Severe stenosis at the origin of the right  vertebral artery. Hypoplastic right vertebral artery otherwise patent within the neck without stenosis or other abnormality. Skeleton: No visible acute osseous  abnormality, although cervical spine better evaluated on concomitant CT of the cervical spine. No discrete or worrisome osseous lesions. Other neck: No other acute soft tissue abnormality within the neck. No mass or adenopathy. Upper chest: Scattered atelectatic changes noted within the visualized lungs. Few scattered superimposed tree-in-bud nodular densities within the peripheral right upper lobe could reflect changes of acute bronchiolitis and/or mucoid impaction. No frank airspace consolidation. Review of the MIP images confirms the above findings CTA HEAD FINDINGS Anterior circulation: Petrous, cavernous, and supraclinoid segments of both internal carotid arteries are widely patent without stenosis. A1 segments patent bilaterally. Normal anterior communicating artery. 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 Finding presumably chronic in nature given the chronic bilateral ACA territory infarcts seen on prior noncontrast head CT. Scant distal reconstitution, likely collateral. M1 segments patent bilaterally. Normal MCA bifurcations. Distal MCA branches well perfused and symmetric. Posterior circulation: Dominant left vertebral artery widely patent to the vertebrobasilar junction. Left PICA patent. Hypoplastic right vertebral artery grossly patent to the vertebrobasilar junction as well. Right PICA not visualized. Basilar patent to its distal aspect without stenosis. Superior cerebellar arteries patent bilaterally. Fetal type origin of the left PCA. Right PCA supplied via the basilar as well as a robust right posterior communicating artery. Scattered atheromatous irregularity within both PCAs without high-grade stenosis. Short-segment mild right P2 stenosis noted (series 10, image 19). Venous sinuses: Not well assessed due to timing of the  contrast bolus. Anatomic variants: Fetal type origin of the left PCA. Hypoplastic right vertebral artery. 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 Review of the MIP images confirms the above findings CT Brain Perfusion Findings: ASPECTS: 10 CBF (<30%) Volume: 0mL Perfusion (Tmax>6.0s) volume: 18mL Mismatch Volume: 18mL Infarction Location:Negative CT perfusion for acute ischemia. Delayed perfusion seen within the right ACA distribution, in keeping with the chronic ACA occlusion. IMPRESSION: CTA HEAD AND NECK IMPRESSION: 1. Negative CTA for emergent large vessel occlusion. 2. 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 3. Scattered mild for age atheromatous change elsewhere about the major arterial vasculature of the head and neck. No other hemodynamically significant or correctable stenosis. 4. Few scattered tree-in-bud nodular densities within right upper lobe, nonspecific, but could reflect changes of acute bronchiolitis and/or mucoid impaction. No frank airspace consolidation. CT PERFUSION IMPRESSION: 1. Negative CT perfusion for acute ischemia. 2. 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 Critical Value/emergent results were called by telephone at the time of interpretation on 11/09/2020 at 9:45 pm to provider [facility] , who verbally acknowledged these results. Electronically Signed   By: Rise Mu M.D.   On: 11/09/2020 22:11   MR BRAIN WO CONTRAST  Result Date: 11/10/2020 CLINICAL DATA:  Stroke follow-up. EXAM: MRI HEAD WITHOUT CONTRAST TECHNIQUE: Multiplanar, multiecho pulse sequences of the brain and surrounding structures were obtained without intravenous contrast. COMPARISON:  Head CT November 09, 2020. FINDINGS: Incomplete study due to patient inability to lie still in the scanner for the duration the study. Chronic bilateral ACA territory  infarct. T2 hyperintensity within the periventricular white matter, nonspecific, most likely related to chronic small vessel ischemia. Brain: No acute infarction, hemorrhage, hydrocephalus, extra-axial collection or mass lesion. Vascular: Normal flow voids at the skull base. Sinuses/Orbits: Opacification of the bilateral frontal sinuses. The orbits are grossly unremarkable. IMPRESSION: 1. Incomplete study . 2. No acute intracranial abnormality identified. 3. Bilateral frontal sinus disease. Electronically Signed   By:  Baldemar Lenis M.D.   On: 11/10/2020 18:00   US RENAL  Result Date: 11/10/2020 CLINICAL DATA:  Rule out hydronephrosis EXAM: RENAL / URINARY TRACT ULTRASOUND COMPLETE COMPARISON:  None. FINDINGS: Right Kidney: Renal measurements: 8.7 x 5.1 x 4.7 cm = volume: 109 mL. Echogenicity within normal limits. No mass or hydronephrosis visualized. Left Kidney: Renal measurements: 10.0 x 4.7 x 4.6 cm = volume: 112 mL. Echogenicity within normal limits. No mass or hydronephrosis visualized. Bladder: Decompressed with Foley catheter in place. Other: None. IMPRESSION: No acute findings.  No hydronephrosis. Electronically Signed   By: Charlett Nose M.D.   On: 11/10/2020 21:40   CT C-SPINE NO CHARGE  Result Date: 11/09/2020 CLINICAL DATA:  Left-sided weakness EXAM: CT CERVICAL SPINE WITHOUT CONTRAST TECHNIQUE: Multidetector CT imaging of the cervical spine was performed without intravenous contrast. Multiplanar CT image reconstructions were also generated. COMPARISON:  None. FINDINGS: Alignment: Alignment is anatomic. Skull base and vertebrae: No acute fracture. No primary bone lesion or focal pathologic process. Soft tissues and spinal canal: Please refer to separately reported CT angiography of the neck for description of vascular findings. No prevertebral fluid or swelling. No visible canal hematoma. Disc levels: Bridging anterior osteophytes are seen throughout the entire cervical spine. Mild  disc osteophyte complex at C4-5. No significant central canal or neural foraminal encroachment. Upper chest: Airway is patent. Lung apices are clear. Other: Reconstructed images confirm the above findings. IMPRESSION: 1. No acute cervical spine fracture. 2. Diffuse cervical spondylosis. No significant neural foraminal or central canal stenosis. Electronically Signed   By: Sharlet Salina M.D.   On: 11/09/2020 22:02   CT Code Stroke Cerebral Perfusion with contrast  Result Date: 11/09/2020 CLINICAL DATA:  Initial evaluation for acute stroke, left-sided weakness. EXAM: CT ANGIOGRAPHY HEAD AND NECK CT PERFUSION BRAIN TECHNIQUE: Multidetector CT imaging of the head and neck was performed using the standard protocol during bolus administration of intravenous contrast. Multiplanar CT image reconstructions and MIPs were obtained to evaluate the vascular anatomy. Carotid stenosis measurements (when applicable) are obtained utilizing NASCET criteria, using the distal internal carotid diameter as the denominator. Multiphase CT imaging of the brain was performed following IV bolus contrast injection. Subsequent parametric perfusion maps were calculated using RAPID software. CONTRAST:  100 cc of Omnipaque 350. COMPARISON:  Prior head CT from earlier the same day. FINDINGS: CTA NECK FINDINGS Aortic arch: Visualized aortic arch of normal caliber with normal branch pattern. No hemodynamically significant stenosis about the origin of the great vessels. Visualized subclavian arteries widely patent. Right carotid system: Right common and internal carotid arteries widely patent without stenosis, dissection or occlusion. Left carotid system: Left CCA patent from its origin to the bifurcation without stenosis. Mild eccentric soft plaque at the origin of the left ICA without significant stenosis. Left ICA patent distally without stenosis, dissection or occlusion. Vertebral arteries: Both vertebral arteries arise from the subclavian  arteries. Dominant left vertebral artery with a diffusely hypoplastic right vertebral artery. Left vertebral artery widely patent within the neck without stenosis or other abnormality. Severe stenosis at the origin of the right vertebral artery. Hypoplastic right vertebral artery otherwise patent within the neck without stenosis or other abnormality. Skeleton: No visible acute osseous abnormality, although cervical spine better evaluated on concomitant CT of the cervical spine. No discrete or worrisome osseous lesions. Other neck: No other acute soft tissue abnormality within the neck. No mass or adenopathy. Upper chest: Scattered atelectatic changes noted within the visualized lungs. Few scattered  superimposed tree-in-bud nodular densities within the peripheral right upper lobe could reflect changes of acute bronchiolitis and/or mucoid impaction. No frank airspace consolidation. Review of the MIP images confirms the above findings CTA HEAD FINDINGS Anterior circulation: Petrous, cavernous, and supraclinoid segments of both internal carotid arteries are widely patent without stenosis. A1 segments patent bilaterally. Normal anterior communicating artery. 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 Finding presumably chronic in nature given the chronic bilateral ACA territory infarcts seen on prior noncontrast head CT. Scant distal reconstitution, likely collateral. M1 segments patent bilaterally. Normal MCA bifurcations. Distal MCA branches well perfused and symmetric. Posterior circulation: Dominant left vertebral artery widely patent to the vertebrobasilar junction. Left PICA patent. Hypoplastic right vertebral artery grossly patent to the vertebrobasilar junction as well. Right PICA not visualized. Basilar patent to its distal aspect without stenosis. Superior cerebellar arteries patent bilaterally. Fetal type origin of the left PCA. Right PCA supplied via the basilar as well as a robust right posterior  communicating artery. Scattered atheromatous irregularity within both PCAs without high-grade stenosis. Short-segment mild right P2 stenosis noted (series 10, image 19). Venous sinuses: Not well assessed due to timing of the contrast bolus. Anatomic variants: Fetal type origin of the left PCA. Hypoplastic right vertebral artery. 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 Review of the MIP images confirms the above findings CT Brain Perfusion Findings: ASPECTS: 10 CBF (<30%) Volume: 84mL Perfusion (Tmax>6.0s) volume: 58mL Mismatch Volume: 65mL Infarction Location:Negative CT perfusion for acute ischemia. Delayed perfusion seen within the right ACA distribution, in keeping with the chronic ACA occlusion. IMPRESSION: CTA HEAD AND NECK IMPRESSION: 1. Negative CTA for emergent large vessel occlusion. 2. 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 3. Scattered mild for age atheromatous change elsewhere about the major arterial vasculature of the head and neck. No other hemodynamically significant or correctable stenosis. 4. Few scattered tree-in-bud nodular densities within right upper lobe, nonspecific, but could reflect changes of acute bronchiolitis and/or mucoid impaction. No frank airspace consolidation. CT PERFUSION IMPRESSION: 1. Negative CT perfusion for acute ischemia. 2. 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 Critical Value/emergent results were called by telephone at the time of interpretation on 11/09/2020 at 9:45 pm to provider [facility] , who verbally acknowledged these results. Electronically Signed   By: Rise Mu M.D.   On: 11/09/2020 22:11   DG Chest Port 1 View  Result Date: 11/09/2020 CLINICAL DATA:  Fall EXAM: PORTABLE CHEST 1 VIEW COMPARISON:  None. FINDINGS: The heart size and mediastinal contours are within normal limits. Both lungs are  clear. The visualized skeletal structures are unremarkable. IMPRESSION: No active disease. Electronically Signed   By: Jonna Clark M.D.   On: 11/09/2020 22:38   DG HIP UNILAT WITH PELVIS 2-3 VIEWS RIGHT  Result Date: 11/10/2020 CLINICAL DATA:  Found down, right hip tenderness EXAM: DG HIP (WITH OR WITHOUT PELVIS) 2-3V RIGHT COMPARISON:  None. FINDINGS: Frontal view of the pelvis including both hips as well as frontal and frogleg lateral views of the right hip are obtained. No fracture, subluxation, or dislocation. Joint spaces are well preserved. Soft tissues are normal. IMPRESSION: 1. No acute displaced fracture. Electronically Signed   By: Sharlet Salina M.D.   On: 11/10/2020 19:09   CT HEAD CODE STROKE WO CONTRAST  Result Date: 11/09/2020 CLINICAL DATA:  Code stroke. Initial evaluation for acute left-sided weakness, stroke suspected. EXAM: CT  HEAD WITHOUT CONTRAST TECHNIQUE: Contiguous axial images were obtained from the base of the skull through the vertex without intravenous contrast. COMPARISON:  None available. FINDINGS: Brain: Age-related cerebral atrophy with chronic small vessel ischemic disease. Remote lacunar infarct present at the left caudate. Remote bilateral ACA territory infarcts noted. No acute intracranial hemorrhage. No acute large vessel territory infarct. No mass lesion, mass effect or midline shift. Mild ventricular prominence related to global parenchymal volume loss without hydrocephalus. No extra-axial fluid collection. Vascular: No hyperdense vessel. Skull: Scalp soft tissues demonstrate no acute finding. Small lipoma noted at the posterior scalp. Calvarium intact. Sinuses/Orbits: Globes and orbital soft tissues within normal limits. Chronic frontal sinusitis noted. Scattered mucosal thickening noted elsewhere within the sphenoid ethmoidal sinuses. No mastoid effusion. Other: None. ASPECTS [facility] Stroke Program Early CT Score) - Ganglionic level infarction (caudate, lentiform  nuclei, internal capsule, insula, M1-M3 cortex): 7 - Supraganglionic infarction (M4-M6 cortex): 3 Total score (0-10 with 10 being normal): 10 IMPRESSION: 1. No acute intracranial infarct or other abnormality. 2. ASPECTS is 10. 3. Remote bilateral ACA territory infarcts, with additional remote lacunar infarct at the left basal ganglia. 4. Underlying atrophy with chronic small vessel ischemic disease. These results were communicated to Dr. Wilford Corner at 9:31 pmon 2/8/2022by text page via the [facility] messaging system. Electronically Signed   By: Rise Mu M.D.   On: 11/09/2020 21:51   ECHOCARDIOGRAM LIMITED  Result Date: 11/10/2020    ECHOCARDIOGRAM LIMITED REPORT   Patient Name:   Jeffery HEYER  Date of Exam: 11/10/2020 Medical Rec #:  [id number]  Height:       72.0 in Accession #:    [id number] Weight:       217.6 lb Date of Birth:  May 20, 1951 BSA:          2.208 m Patient Age:    69 years   BP:           138/94 mmHg Patient Gender: M          HR:           138 bpm. Exam Location:  Inpatient Procedure: Limited Echo, Cardiac Doppler and Color Doppler Indications:    Atrial fibrillation  History:        Patient has prior history of Echocardiogram examinations, most                 recent 07/19/2017. CHF; Arrythmias:Atrial Fibrillation. Dilated                 cardiomyopathy. CKD.  Sonographer:    Ross Ludwig RDCS (AE) Referring Phys: [id number] [facility]  Sonographer Comments: Suboptimal parasternal window. IMPRESSIONS  1. Left ventricular ejection fraction, by estimation, is 60 to 65%. The left ventricle has normal function. The left ventricle has no regional wall motion abnormalities. There is moderate left ventricular hypertrophy. Left ventricular diastolic parameters are indeterminate.  2. Right ventricular systolic function is normal. The right ventricular size is normal.  3. Left atrial size was mildly dilated.  4. The mitral valve is normal in structure. Trivial mitral valve regurgitation. No evidence of  mitral stenosis.  5. The aortic valve was not well visualized. Aortic valve regurgitation is not visualized. No aortic stenosis is present.  6. The inferior vena cava is dilated in size with >50% respiratory variability, suggesting right atrial pressure of 8 mmHg. FINDINGS  Left Ventricle: Left ventricular ejection fraction, by estimation, is 60 to 65%. The left ventricle has normal function. The left ventricle has  no regional wall motion abnormalities. The left ventricular internal cavity size was normal in size. There is  moderate left ventricular hypertrophy. Left ventricular diastolic parameters are indeterminate. Right Ventricle: The right ventricular size is normal. No increase in right ventricular wall thickness. Right ventricular systolic function is normal. Left Atrium: Left atrial size was mildly dilated. Right Atrium: Right atrial size was normal in size. Pericardium: There is no evidence of pericardial effusion. Mitral Valve: The mitral valve is normal in structure. Trivial mitral valve regurgitation. No evidence of mitral valve stenosis. Tricuspid Valve: The tricuspid valve is normal in structure. Tricuspid valve regurgitation is mild . No evidence of tricuspid stenosis. Aortic Valve: The aortic valve was not well visualized. Aortic valve regurgitation is not visualized. No aortic stenosis is present. Pulmonic Valve: The pulmonic valve was normal in structure. Pulmonic valve regurgitation is not visualized. No evidence of pulmonic stenosis. Aorta: The aortic root is normal in size and structure. Venous: The inferior vena cava is dilated in size with greater than 50% respiratory variability, suggesting right atrial pressure of 8 mmHg. IAS/Shunts: No atrial level shunt detected by color flow Doppler. LEFT VENTRICLE PLAX 2D LVIDd:         4.80 cm LVIDs:         3.40 cm LV PW:         1.60 cm LV IVS:        1.50 cm LVOT diam:     2.10 cm LVOT Area:     3.46 cm  IVC IVC diam: 1.70 cm LEFT ATRIUM          Index LA diam:    3.90 cm 1.77 cm/m   AORTA Ao Root diam: 3.60 cm  SHUNTS Systemic Diam: 2.10 cm Charlton Haws MD Electronically signed by Charlton Haws MD Signature Date/Time: 11/10/2020/3:42:53 PM    Final     Labs: BMET Recent Labs  Lab 11/09/20 2112 11/10/20 0804 11/10/20 1508 11/10/20 2000 11/11/20 0035  NA 168* 166* 167* 164* 163*  K 5.4* 4.6 4.1 4.0 3.8  CL 128* >130* >130* >130* >130*  CO2 21* 20* 20* 17* 17*  GLUCOSE 157* 184* 152* 210* 212*  BUN 101* 89* 85* 85* 86*  CREATININE 3.10* 2.65* 2.58* 2.55* 2.46*  CALCIUM 9.7 8.8* 8.5* 8.3* 8.6*  PHOS  --   --  3.5  --   --    CBC Recent Labs  Lab 11/09/20 2112 11/10/20 0804 11/11/20 0035  WBC 10.5 9.4 9.1  NEUTROABS 8.2* 8.3* 8.2*  HGB 17.0 14.9 12.8*  HCT 56.9* 50.7 42.8  MCV 91.8 92.5 91.6  PLT 397 318 252    Medications:     Chlorhexidine Gluconate Cloth  6 each Topical Daily   methylPREDNISolone (SOLU-MEDROL) injection  1 mg/kg Intravenous Q12H   Followed by   Melene Muller ON 11/13/2020] predniSONE  50 mg Oral Daily   sodium chloride flush  3 mL Intravenous Once     Jackelyn Poling, DO [facility] Family Medicine Resident, PGY2 11/11/2020, 8:07 AM

## 2020-11-11 NOTE — Evaluation (Signed)
Physical Therapy Evaluation Patient Details Name: Jeffery Christian MRN: [id number] DOB: 18-Jan-1951 Today's Date: 11/11/2020   History of Present Illness  70 y.o. male with medical history significant for atrial fibrillation on Eliquis-last dose unknown, dilated cardiomyopathy, chronic systolic CHF, CKD 3 who presents by EMS after being found on the floor at home by his friends.  He was last seen about 1 week ago by his friends. Left sided weakness, Afib with RVR, CT head negative, severe hypernatremia, hyperkalemia, metabolic encephalopathy; +COVID  Clinical Impression   Pt admitted with above diagnosis. Patient was living alone PTA, however reports recent falls and most recently could not get himself up off the floor. Patient with weakness of LUE and RLE with CT and MRI of head negative for acute changes. Pt reports LE weakness has been ongoing and LUE weakness is new. Does not currently have 24/7 support as he would need for discharge home.  Pt currently with functional limitations due to the deficits listed below (see PT Problem List). Pt will benefit from skilled PT to increase their independence and safety with mobility to allow discharge to the venue listed below.       Follow Up Recommendations SNF    Equipment Recommendations  Wheelchair (measurements PT);Wheelchair cushion (measurements PT);Rolling walker with 5" wheels    Recommendations for Other Services       Precautions / Restrictions Precautions Precautions: Fall Precaution Comments: reports his legs get weak and he "just goes down"-has happened twice now      Mobility  Bed Mobility               General bed mobility comments: up to chair position via bed (earlier today +1 max assist with OT to sit EOB)    Transfers                    Ambulation/Gait                Stairs            Wheelchair Mobility    Modified Rankin (Stroke Patients Only)       Balance                                              Pertinent Vitals/Pain Pain Assessment: No/denies pain    Home Living Family/patient expects to be discharged to:: Unsure Living Arrangements: Alone                    Prior Function Level of Independence: Independent         Comments: with falls     Hand Dominance   Dominant Hand: Left    Extremity/Trunk Assessment   Upper Extremity Assessment Upper Extremity Assessment: Defer to OT evaluation    Lower Extremity Assessment Lower Extremity Assessment: Generalized weakness;RLE deficits/detail;LLE deficits/detail RLE Deficits / Details: in chair position, limited knee extension due to tight hamstrings; knee extension 2+, hip flex 2+, ankle DF 3+ LLE Deficits / Details: in chair position, limited knee extension due to tight hamstrings; hip flexion 4, knee extension 4, ankle DF 4       Communication   Communication: No difficulties  Cognition Arousal/Alertness: Awake/alert Behavior During Therapy: WFL for tasks assessed/performed Overall Cognitive Status: No family/caregiver present to determine baseline cognitive functioning Area of Impairment: Orientation;Safety/judgement;Awareness  Orientation Level: Place (Time NT)       Safety/Judgement: Decreased awareness of safety;Decreased awareness of deficits Awareness:  (pre-intellectual)   General Comments: States he has fallen like this before; shows poor awareness of LUE weakness (new since fall) and repeatedly tries to use it during toothbrushing      General Comments General comments (skin integrity, edema, etc.): Patient calm and asking for washcloth to was his face and toothbrush/toothpaste (just back from barium swallow)    Exercises General Exercises - Lower Extremity Ankle Circles/Pumps: AROM;10 reps Long Arc Quad: AAROM;Right;AROM;Left;5 reps;Seated Hip Flexion/Marching: AAROM;Right;AROM;Left;5 reps;Seated Heel Raises: AROM;Both;5  reps;Seated   Assessment/Plan    PT Assessment Patient needs continued PT services  PT Problem List Decreased strength;Decreased range of motion;Decreased activity tolerance;Decreased balance;Decreased mobility;Decreased cognition;Decreased knowledge of use of DME;Decreased safety awareness       PT Treatment Interventions DME instruction;Gait training;Functional mobility training;Therapeutic activities;Therapeutic exercise;Cognitive remediation;Patient/family education    PT Goals (Current goals can be found in the Care Plan section)  Acute Rehab PT Goals Patient Stated Goal: to stop fallling PT Goal Formulation: With patient Time For Goal Achievement: 11/25/20 Potential to Achieve Goals: Good    Frequency Min 2X/week   Barriers to discharge Decreased caregiver support      Co-evaluation               AM-PAC PT "6 Clicks" Mobility  Outcome Measure Help needed turning from your back to your side while in a flat bed without using bedrails?: A Little Help needed moving from lying on your back to sitting on the side of a flat bed without using bedrails?: A Lot Help needed moving to and from a bed to a chair (including a wheelchair)?: Total Help needed standing up from a chair using your arms (e.g., wheelchair or bedside chair)?: Total Help needed to walk in hospital room?: Total Help needed climbing 3-5 steps with a railing? : Total 6 Click Score: 9    End of Session   Activity Tolerance: Patient limited by fatigue Patient left: in bed;with call bell/phone within reach;with bed alarm set;with restraints reapplied (in chair position; bil mitts applied) Nurse Communication: Other (comment) (mitts applied but ?could leave off; pt requesting drink) PT Visit Diagnosis: Muscle weakness (generalized) (M62.81);History of falling (Z91.81);Difficulty in walking, not elsewhere classified (R26.2)    Time: 9811-9147 PT Time Calculation (min) (ACUTE ONLY): 33 min   Charges:   PT  Evaluation $PT Eval Low Complexity: 1 Low PT Treatments $Therapeutic Exercise: 8-22 mins         [name], PT Pager [phone]   [name] 11/11/2020, 5:21 PM

## 2020-11-11 NOTE — Evaluation (Addendum)
Clinical/Bedside Swallow Evaluation Patient Details  Name: Jeffery Christian MRN: [id number] Date of Birth: 1950/11/16  Today's Date: 11/11/2020 Time: SLP Start Time (ACUTE ONLY): 0910 SLP Stop Time (ACUTE ONLY): 0934 SLP Time Calculation (min) (ACUTE ONLY): 24 min  Past Medical History:  Past Medical History:  Diagnosis Date  . Chronic systolic CHF (congestive heart failure) (HCC)    EF 35-40, diffuse HK, mild MR, moderate LAE, mild RAE, PASP 44, L pleural eff  . Dilated cardiomyopathy (HCC)    likely related to tachycardia  . Persistent atrial fibrillation [facility])    Past Surgical History:  Past Surgical History:  Procedure Laterality Date  . CARDIOVERSION N/A 08/20/2017   Procedure: CARDIOVERSION;  Surgeon: [name], MD;  Location: [facility] ENDOSCOPY;  Service: Cardiovascular;  Laterality: N/A;  . INCISION AND DRAINAGE ABSCESS Left 03/22/2014   Procedure: INCISION AND DRAINAGE ABSCESS LEFT BUTTOCK ABSCESS;  Surgeon: [name], MD;  Location: [facility] OR;  Service: General;  Laterality: Left;   HPI:  Pt is a 70 y.o. male with medical history significant for atrial fibrillation on Eliquis-last dose unknown, dilated cardiomyopathy, chronic systolic CHF, CKD 3 who presented after being found on the floor at home by his friends who had last seen him one week prior. EMT noticed increased weakness on the left and he was brought in as a code stroke; this was ultimately cancelled by neurology. MRI was negative for acute changes. Pt found to have COVID-19. CXR 2/8 was negative.   Assessment / Plan / Recommendation Clinical Impression  Pt was seen for bedside swallow evaluation and he denied a history of dysphagia. Oral mechanism exam was limited due to pt's difficulty following some commands; however, oral motor strength and ROM appeared grossly WFL. Dentition was adequate. Dried secretions/mucous were noted on the tongue and were resistant to movement despite provision of oral care. Pt exhibited  symptoms of oropharyngeal dysphagia characterized by signs of aspiration with thin and nectar thick liquids, prolonged mastication of regular textures, moderate lingual residue, and multiple swallows. Pt did not demonstrate a cough at baseline and aspiration is therefore suspected. Smaller bolus sizes reduced, but did not eliminate, signs of aspiration. A modified barium swallow study is recommended to further assess swallow function and is scheduled for 1400. Pt's NPO status will be maintained, but critical meds may be crushed and given in puree.  SLP Visit Diagnosis: Dysphagia, unspecified (R13.10)    Aspiration Risk  Moderate aspiration risk    Diet Recommendation NPO   Medication Administration: Crushed with puree Supervision: Staff to assist with self feeding    Other  Recommendations Oral Care Recommendations: Oral care QID   Follow up Recommendations  (TBD)      Frequency and Duration min 2x/week  2 weeks       Prognosis Prognosis for Safe Diet Advancement: Good Barriers to Reach Goals: Cognitive deficits      Swallow Study   General Date of Onset: 11/10/20 HPI: Pt is a 70 y.o. male with medical history significant for atrial fibrillation on Eliquis-last dose unknown, dilated cardiomyopathy, chronic systolic CHF, CKD 3 who presented after being found on the floor at home by his friends who had last seen him one week prior. EMT noticed increased weakness on the left and he was brought in as a code stroke; this was ultimately cancelled by neurology. MRI was negative for acute changes. Pt found to have COVID-19. CXR 2/8 was negative. Type of Study: Bedside Swallow Evaluation Previous Swallow Assessment: None  Diet Prior to this Study: NPO Temperature Spikes Noted: No Respiratory Status: Room air History of Recent Intubation: No Behavior/Cognition: Alert;Cooperative;Pleasant mood Oral Cavity Assessment: Dry;Dried secretions Oral Care Completed by SLP: Yes Oral Cavity -  Dentition: Adequate natural dentition Vision: Functional for self-feeding Self-Feeding Abilities: Needs assist Patient Positioning: Upright in bed;Postural control adequate for testing Baseline Vocal Quality: Low vocal intensity Volitional Swallow: Able to elicit    Oral/Motor/Sensory Function Overall Oral Motor/Sensory Function: Within functional limits   Ice Chips Ice chips: Within functional limits Presentation: Spoon   Thin Liquid Thin Liquid: Impaired Presentation: Cup;Straw Pharyngeal  Phase Impairments: Cough - Immediate;Multiple swallows    Nectar Thick Nectar Thick Liquid: Impaired Pharyngeal Phase Impairments: Cough - Immediate;Cough - Delayed   Honey Thick Honey Thick Liquid: Not tested   Puree Puree: Within functional limits Presentation: Spoon   Solid     Solid: Impaired Presentation: Spoon Oral Phase Impairments: Impaired mastication Oral Phase Functional Implications: Impaired mastication;Oral residue     Cordie Beazley I. Vear Clock, MS, CCC-SLP Acute Rehabilitation Services Office number [phone] Pager [phone]  Scheryl Marten 11/11/2020,9:47 AM

## 2020-11-12 ENCOUNTER — Inpatient Hospital Stay (HOSPITAL_COMMUNITY): Payer: Medicare Other

## 2020-11-12 DIAGNOSIS — G9341 Metabolic encephalopathy: Secondary | ICD-10-CM | POA: Diagnosis not present

## 2020-11-12 DIAGNOSIS — N179 Acute kidney failure, unspecified: Secondary | ICD-10-CM | POA: Diagnosis not present

## 2020-11-12 DIAGNOSIS — I4891 Unspecified atrial fibrillation: Secondary | ICD-10-CM | POA: Diagnosis not present

## 2020-11-12 LAB — COMPREHENSIVE METABOLIC PANEL
ALT: 26 U/L (ref 0–44)
AST: 27 U/L (ref 15–41)
Albumin: 2.2 g/dL — ABNORMAL LOW (ref 3.5–5.0)
Alkaline Phosphatase: 56 U/L (ref 38–126)
Anion gap: 9 (ref 5–15)
BUN: 62 mg/dL — ABNORMAL HIGH (ref 8–23)
CO2: 17 mmol/L — ABNORMAL LOW (ref 22–32)
Calcium: 8.1 mg/dL — ABNORMAL LOW (ref 8.9–10.3)
Chloride: 124 mmol/L — ABNORMAL HIGH (ref 98–111)
Creatinine, Ser: 2.2 mg/dL — ABNORMAL HIGH (ref 0.61–1.24)
GFR, Estimated: 32 mL/min — ABNORMAL LOW (ref >60)
Glucose, Bld: 176 mg/dL — ABNORMAL HIGH (ref 70–99)
Potassium: 3.5 mmol/L (ref 3.5–5.1)
Sodium: 150 mmol/L — ABNORMAL HIGH (ref 135–145)
Total Bilirubin: 0.6 mg/dL (ref 0.3–1.2)
Total Protein: 4.6 g/dL — ABNORMAL LOW (ref 6.5–8.1)

## 2020-11-12 LAB — BASIC METABOLIC PANEL
Anion gap: 11 (ref 5–15)
BUN: 53 mg/dL — ABNORMAL HIGH (ref 8–23)
CO2: 18 mmol/L — ABNORMAL LOW (ref 22–32)
Calcium: 8.2 mg/dL — ABNORMAL LOW (ref 8.9–10.3)
Chloride: 122 mmol/L — ABNORMAL HIGH (ref 98–111)
Creatinine, Ser: 1.9 mg/dL — ABNORMAL HIGH (ref 0.61–1.24)
GFR, Estimated: 38 mL/min — ABNORMAL LOW (ref >60)
Glucose, Bld: 133 mg/dL — ABNORMAL HIGH (ref 70–99)
Potassium: 3.7 mmol/L (ref 3.5–5.1)
Sodium: 151 mmol/L — ABNORMAL HIGH (ref 135–145)

## 2020-11-12 LAB — URINE CULTURE

## 2020-11-12 LAB — CBC WITH DIFFERENTIAL/PLATELET
Abs Immature Granulocytes: 0.05 10*3/uL (ref 0.00–0.07)
Basophils Absolute: 0 10*3/uL (ref 0.0–0.1)
Basophils Relative: 0 %
Eosinophils Absolute: 0 10*3/uL (ref 0.0–0.5)
Eosinophils Relative: 0 %
HCT: 36.7 % — ABNORMAL LOW (ref 39.0–52.0)
Hemoglobin: 11.7 g/dL — ABNORMAL LOW (ref 13.0–17.0)
Immature Granulocytes: 1 %
Lymphocytes Relative: 9 %
Lymphs Abs: 0.7 10*3/uL (ref 0.7–4.0)
MCH: 28.5 pg (ref 26.0–34.0)
MCHC: 31.9 g/dL (ref 30.0–36.0)
MCV: 89.5 fL (ref 80.0–100.0)
Monocytes Absolute: 0.6 10*3/uL (ref 0.1–1.0)
Monocytes Relative: 7 %
Neutro Abs: 7.2 10*3/uL (ref 1.7–7.7)
Neutrophils Relative %: 83 %
Platelets: 208 10*3/uL (ref 150–400)
RBC: 4.1 MIL/uL — ABNORMAL LOW (ref 4.22–5.81)
RDW: 16.7 % — ABNORMAL HIGH (ref 11.5–15.5)
WBC: 8.6 10*3/uL (ref 4.0–10.5)
nRBC: 0.2 % (ref 0.0–0.2)

## 2020-11-12 LAB — C-REACTIVE PROTEIN: CRP: 2 mg/dL — ABNORMAL HIGH (ref <1.0)

## 2020-11-12 MED ORDER — METOPROLOL TARTRATE 25 MG PO TABS
25.0000 mg | ORAL_TABLET | Freq: Two times a day (BID) | ORAL | Status: DC
  Administered 2020-11-12 – 2020-11-16 (×8): 25 mg via ORAL
  Filled 2020-11-12 (×10): qty 1

## 2020-11-12 MED ORDER — BACITRACIN-NEOMYCIN-POLYMYXIN OINTMENT TUBE
TOPICAL_OINTMENT | Freq: Two times a day (BID) | CUTANEOUS | Status: DC
  Filled 2020-11-12 (×2): qty 14

## 2020-11-12 MED ORDER — SODIUM CHLORIDE 0.9 % IV SOLN: INTRAVENOUS | Status: DC

## 2020-11-12 MED ORDER — ENSURE ENLIVE PO LIQD
237.0000 mL | Freq: Two times a day (BID) | ORAL | Status: DC
  Administered 2020-11-12 – 2020-11-16 (×8): 237 mL via ORAL
  Filled 2020-11-12: qty 237

## 2020-11-12 MED ORDER — HALOPERIDOL LACTATE 5 MG/ML IJ SOLN
5.0000 mg | Freq: Four times a day (QID) | INTRAMUSCULAR | Status: DC | PRN
  Administered 2020-11-12: 5 mg via INTRAVENOUS
  Filled 2020-11-12: qty 1

## 2020-11-12 MED ORDER — COLLAGENASE 250 UNIT/GM EX OINT
TOPICAL_OINTMENT | Freq: Every day | CUTANEOUS | Status: DC
  Filled 2020-11-12: qty 30

## 2020-11-12 MED ORDER — LORAZEPAM 2 MG/ML IJ SOLN
1.0000 mg | Freq: Once | INTRAMUSCULAR | Status: AC | PRN
  Administered 2020-11-12: 1 mg via INTRAVENOUS
  Filled 2020-11-12: qty 1

## 2020-11-12 NOTE — Progress Notes (Signed)
[facility] Progress Note    Assessment/ Plan:    Hypovolemic hypernatremia: Initial sodium level of 168, improved to 150.    Patient continues on half normal saline at 125/h, continues on D5 at 3150mL/hr.  I's and O's show 2.3L up since admission.  Physical exam shows dry mucous membranes but new trace pitting edema the bilateral lower extremities.  Patient now has a diet and is eating this morning without difficulty. -DC fluids and monitor patient p.o. intake -We will check a.m. labs  AKI on CKD stage III: Baseline creatinine appears to be ~1.4. Creatinine on presentation of 3.10.  Currently improved at 2.20.  BUN elevated at 62.  Patient with 2.3L up since admission, he has dry mucous membranes but has some trace pitting edema in the lower extremities.  Since patient has a diet will DC fluids. -Continue to monitor daily Cr, -Monitor Daily I/Os, Daily weight  -Avoid nephrotoxic medications  Hypertension: Patient with some elevated blood pressure since arrival.  Now with most recent blood pressure 134/84.   Hyperkalemia Potassium was elevated on labs last night at 5.6. AM K+ of 3.5  Atrial fib with RVR: Placed on Cardizem drip in the emergency department which followed by amiodarone infusion. Currently on diltiazem drip.  Current heart rate of 89. Per primary  Acute metabolic encephalopathy: neurology consulted by primary team.  MRI completed and shows no acute intracranial abnormality, however study was incomplete due to patient's inability to lie still.  Per primary team/neurology.  COVID-19 infection-Per primary  Subjective:   Patient looks much better today.  He is speaking in full sentences and answering questions.  He states he feels close to his baseline.  He denies complaints at this time.   Objective:   BP 131/84 (BP Location: Right Arm)   Pulse 89   Temp 98.1 F (36.7 C) (Oral)   Resp 14   Ht 6' (1.829 m)   Wt 98.7 kg   SpO2 98%   BMI 29.51 kg/m    Intake/Output Summary (Last 24 hours) at 11/12/2020 0654 Last data filed at 11/11/2020 1957 Gross per 24 hour  Intake 1275.46 ml  Output 700 ml  Net 575.46 ml   Weight change:   Physical Exam: General: Alert and oriented in no apparent distress Heart: Mildly tachycardic rate, no murmurs, trace pitting edema in bilateral lower extremities Lungs: CTA bilaterally, no wheezing, breathing well on room air Skin: Warm and dry  Imaging: DG Knee 1-2 Views Right  Result Date: 11/10/2020 CLINICAL DATA:  Found down, tender to palpation EXAM: RIGHT KNEE - 1-2 VIEW COMPARISON:  None. FINDINGS: Frontal and lateral views of the right knee demonstrate 3 compartmental osteoarthritis most severe in the medial compartment. No acute fracture, subluxation, or dislocation. No joint effusion. IMPRESSION: 1. Three compartmental osteoarthritis.  No acute fracture. Electronically Signed   By: Sharlet SalinaMichael  Brown M.D.   On: 11/10/2020 19:12   MR BRAIN WO CONTRAST  Result Date: 11/10/2020 CLINICAL DATA:  Stroke follow-up. EXAM: MRI HEAD WITHOUT CONTRAST TECHNIQUE: Multiplanar, multiecho pulse sequences of the brain and surrounding structures were obtained without intravenous contrast. COMPARISON:  Head CT November 09, 2020. FINDINGS: Incomplete study due to patient inability to lie still in the scanner for the duration the study. Chronic bilateral ACA territory infarct. T2 hyperintensity within the periventricular white matter, nonspecific, most likely related to chronic small vessel ischemia. Brain: No acute infarction, hemorrhage, hydrocephalus, extra-axial collection or mass lesion. Vascular: Normal flow voids at the skull base.  Sinuses/Orbits: Opacification of the bilateral frontal sinuses. The orbits are grossly unremarkable. IMPRESSION: 1. Incomplete study . 2. No acute intracranial abnormality identified. 3. Bilateral frontal sinus disease. Electronically Signed   By: Baldemar Lenis M.D.   On: 11/10/2020  18:00   US RENAL  Result Date: 11/10/2020 CLINICAL DATA:  Rule out hydronephrosis EXAM: RENAL / URINARY TRACT ULTRASOUND COMPLETE COMPARISON:  None. FINDINGS: Right Kidney: Renal measurements: 8.7 x 5.1 x 4.7 cm = volume: 109 mL. Echogenicity within normal limits. No mass or hydronephrosis visualized. Left Kidney: Renal measurements: 10.0 x 4.7 x 4.6 cm = volume: 112 mL. Echogenicity within normal limits. No mass or hydronephrosis visualized. Bladder: Decompressed with Foley catheter in place. Other: None. IMPRESSION: No acute findings.  No hydronephrosis. Electronically Signed   By: Charlett Nose M.D.   On: 11/10/2020 21:40   DG CHEST PORT 1 VIEW  Result Date: 11/11/2020 CLINICAL DATA:  PICC line placement EXAM: PORTABLE CHEST 1 VIEW COMPARISON:  11/09/2020 FINDINGS: Left upper extremity PICC line is been placed with its tip within the superior vena cava. Minimal left basilar atelectasis. Lungs are otherwise clear. No pneumothorax or pleural effusion. Cardiac size within normal limits. Pulmonary vascularity is normal. No acute bone abnormality. IMPRESSION: Left upper extremity PICC line tip within the superior vena cava. Electronically Signed   By: Helyn Numbers MD   On: 11/11/2020 13:12   DG Swallowing Func-Speech Pathology  Result Date: 11/11/2020 Objective Swallowing Evaluation: Type of Study: MBS-Modified Barium Swallow Study  Patient Details Name: Jeffery Christian MRN: [id number] Date of Birth: 11-27-1950 Today's Date: 11/11/2020 Time: SLP Start Time (ACUTE ONLY): 1437 -SLP Stop Time (ACUTE ONLY): 1455 SLP Time Calculation (min) (ACUTE ONLY): 18 min Past Medical History: Past Medical History: Diagnosis Date . Chronic systolic CHF (congestive heart failure) (HCC)   EF 35-40, diffuse HK, mild MR, moderate LAE, mild RAE, PASP 44, L pleural eff . Dilated cardiomyopathy (HCC)   likely related to tachycardia . Persistent atrial fibrillation [facility])  Past Surgical History: Past Surgical History: Procedure Laterality  Date . CARDIOVERSION N/A 08/20/2017  Procedure: CARDIOVERSION;  Surgeon: [name], MD;  Location: [facility] ENDOSCOPY;  Service: Cardiovascular;  Laterality: N/A; . INCISION AND DRAINAGE ABSCESS Left 03/22/2014  Procedure: INCISION AND DRAINAGE ABSCESS LEFT BUTTOCK ABSCESS;  Surgeon: [name], MD;  Location: [facility] OR;  Service: General;  Laterality: Left; HPI: Pt is a 69 y.o. male with medical history significant for atrial fibrillation on Eliquis-last dose unknown, dilated cardiomyopathy, chronic systolic CHF, CKD 3 who presented after being found on the floor at home by his friends who had last seen him one week prior. EMT noticed increased weakness on the left and he was brought in as a code stroke; this was ultimately cancelled by neurology. MRI was negative for acute changes. Pt found to have COVID-19. CXR 2/8 was negative.  No data recorded Assessment / Plan / Recommendation CHL IP CLINICAL IMPRESSIONS 11/11/2020 Clinical Impression Cervical osteophytes were noted, but did not significantly impact swallow function. Pt demonstrated decreased bolus cohesion, a pharyngeal delay, and incomplete epiglottic inverversion with the initial bolus of thin liquids via cup which was administered by the SLP. Premature spillage was noted to the pyriform sinuses and aspiration (PAS 7) noted thereafter secondary to the pharyngeal delay and incomplete epiglottic inversion. Coughing was noted and effective in expelling some of the aspirated material. Subsequent swallows were significant for prolonged mastication, and mildly reduced lingual retraction, but his swallow was notably more  timely with self-feeding and no subsequent instances of penetration or aspiration were noted even when challenged with large sips and consecutive swallows. Mild to moderate vallecular residue was demonstrated with solids and was reduced to a functional level with a liquid wash. A dysphagia 3 diet with thin liquids is recommended at this time. Pt's  swallow function and associated aspiration risk are much improved with self-feeding and it is recommended that this be encouraged with supervision. SLP will follow to ensure diet tolerance. SLP Visit Diagnosis Dysphagia, unspecified (R13.10) Attention and concentration deficit following -- Frontal lobe and executive function deficit following -- Impact on safety and function Moderate aspiration risk;Mild aspiration risk   CHL IP TREATMENT RECOMMENDATION 11/11/2020 Treatment Recommendations Therapy as outlined in treatment plan below   Prognosis 11/11/2020 Prognosis for Safe Diet Advancement Good Barriers to Reach Goals Cognitive deficits Barriers/Prognosis Comment -- CHL IP DIET RECOMMENDATION 11/11/2020 SLP Diet Recommendations Dysphagia 3 (Mech soft) solids;Thin liquid Liquid Administration via Cup;Straw Medication Administration Whole meds with liquid Compensations Slow rate;Small sips/bites Postural Changes Remain semi-upright after after feeds/meals (Comment);Seated upright at 90 degrees   CHL IP OTHER RECOMMENDATIONS 11/11/2020 Recommended Consults -- Oral Care Recommendations Oral care BID Other Recommendations --   CHL IP FOLLOW UP RECOMMENDATIONS 11/11/2020 Follow up Recommendations (No Data)   CHL IP FREQUENCY AND DURATION 11/11/2020 Speech Therapy Frequency (ACUTE ONLY) min 2x/week Treatment Duration 2 weeks      CHL IP ORAL PHASE 11/11/2020 Oral Phase Impaired Oral - Pudding Teaspoon -- Oral - Pudding Cup -- Oral - Honey Teaspoon -- Oral - Honey Cup -- Oral - Nectar Teaspoon -- Oral - Nectar Cup WFL Oral - Nectar Straw WFL Oral - Thin Teaspoon -- Oral - Thin Cup Decreased bolus cohesion;Premature spillage Oral - Thin Straw WFL Oral - Puree WFL Oral - Mech Soft WFL Oral - Regular WFL Oral - Multi-Consistency -- Oral - Pill WFL Oral Phase - Comment --  CHL IP PHARYNGEAL PHASE 11/11/2020 Pharyngeal Phase Impaired Pharyngeal- Pudding Teaspoon -- Pharyngeal -- Pharyngeal- Pudding Cup -- Pharyngeal -- Pharyngeal-  Honey Teaspoon -- Pharyngeal -- Pharyngeal- Honey Cup -- Pharyngeal -- Pharyngeal- Nectar Teaspoon -- Pharyngeal -- Pharyngeal- Nectar Cup Reduced tongue base retraction Pharyngeal -- Pharyngeal- Nectar Straw WFL Pharyngeal -- Pharyngeal- Thin Teaspoon -- Pharyngeal -- Pharyngeal- Thin Cup Reduced tongue base retraction;Delayed swallow initiation-pyriform sinuses;Pharyngeal residue - valleculae;Pharyngeal residue - pyriform;Penetration/Aspiration during swallow;Penetration/Apiration after swallow;Moderate aspiration Pharyngeal Material enters airway, passes BELOW cords and not ejected out despite cough attempt by patient Pharyngeal- Thin Straw -- Pharyngeal -- Pharyngeal- Puree -- Pharyngeal -- Pharyngeal- Mechanical Soft -- Pharyngeal -- Pharyngeal- Regular -- Pharyngeal -- Pharyngeal- Multi-consistency -- Pharyngeal -- Pharyngeal- Pill -- Pharyngeal -- Pharyngeal Comment --  No flowsheet data found. Scheryl Marten 11/11/2020, 3:52 PM              DG HIP UNILAT WITH PELVIS 2-3 VIEWS RIGHT  Result Date: 11/10/2020 CLINICAL DATA:  Found down, right hip tenderness EXAM: DG HIP (WITH OR WITHOUT PELVIS) 2-3V RIGHT COMPARISON:  None. FINDINGS: Frontal view of the pelvis including both hips as well as frontal and frogleg lateral views of the right hip are obtained. No fracture, subluxation, or dislocation. Joint spaces are well preserved. Soft tissues are normal. IMPRESSION: 1. No acute displaced fracture. Electronically Signed   By: Sharlet Salina M.D.   On: 11/10/2020 19:09   ECHOCARDIOGRAM LIMITED  Result Date: 11/10/2020    ECHOCARDIOGRAM LIMITED REPORT   Patient Name:   Jeffery Christian  Date of Exam: 11/10/2020 Medical Rec #:  [id number]  Height:       72.0 in Accession #:    [id number] Weight:       217.6 lb Date of Birth:  10-28-1950 BSA:          2.208 m Patient Age:    69 years   BP:           138/94 mmHg Patient Gender: M          HR:           138 bpm. Exam Location:  Inpatient Procedure: Limited Echo, Cardiac  Doppler and Color Doppler Indications:    Atrial fibrillation  History:        Patient has prior history of Echocardiogram examinations, most                 recent 07/19/2017. CHF; Arrythmias:Atrial Fibrillation. Dilated                 cardiomyopathy. CKD.  Sonographer:    Ross Ludwig RDCS (AE) Referring Phys: [id number] [facility]  Sonographer Comments: Suboptimal parasternal window. IMPRESSIONS  1. Left ventricular ejection fraction, by estimation, is 60 to 65%. The left ventricle has normal function. The left ventricle has no regional wall motion abnormalities. There is moderate left ventricular hypertrophy. Left ventricular diastolic parameters are indeterminate.  2. Right ventricular systolic function is normal. The right ventricular size is normal.  3. Left atrial size was mildly dilated.  4. The mitral valve is normal in structure. Trivial mitral valve regurgitation. No evidence of mitral stenosis.  5. The aortic valve was not well visualized. Aortic valve regurgitation is not visualized. No aortic stenosis is present.  6. The inferior vena cava is dilated in size with >50% respiratory variability, suggesting right atrial pressure of 8 mmHg. FINDINGS  Left Ventricle: Left ventricular ejection fraction, by estimation, is 60 to 65%. The left ventricle has normal function. The left ventricle has no regional wall motion abnormalities. The left ventricular internal cavity size was normal in size. There is  moderate left ventricular hypertrophy. Left ventricular diastolic parameters are indeterminate. Right Ventricle: The right ventricular size is normal. No increase in right ventricular wall thickness. Right ventricular systolic function is normal. Left Atrium: Left atrial size was mildly dilated. Right Atrium: Right atrial size was normal in size. Pericardium: There is no evidence of pericardial effusion. Mitral Valve: The mitral valve is normal in structure. Trivial mitral valve regurgitation. No evidence  of mitral valve stenosis. Tricuspid Valve: The tricuspid valve is normal in structure. Tricuspid valve regurgitation is mild . No evidence of tricuspid stenosis. Aortic Valve: The aortic valve was not well visualized. Aortic valve regurgitation is not visualized. No aortic stenosis is present. Pulmonic Valve: The pulmonic valve was normal in structure. Pulmonic valve regurgitation is not visualized. No evidence of pulmonic stenosis. Aorta: The aortic root is normal in size and structure. Venous: The inferior vena cava is dilated in size with greater than 50% respiratory variability, suggesting right atrial pressure of 8 mmHg. IAS/Shunts: No atrial level shunt detected by color flow Doppler. LEFT VENTRICLE PLAX 2D LVIDd:         4.80 cm LVIDs:         3.40 cm LV PW:         1.60 cm LV IVS:        1.50 cm LVOT diam:     2.10 cm LVOT Area:     3.46  cm  IVC IVC diam: 1.70 cm LEFT ATRIUM         Index LA diam:    3.90 cm 1.77 cm/m   AORTA Ao Root diam: 3.60 cm  SHUNTS Systemic Diam: 2.10 cm Charlton Haws MD Electronically signed by Charlton Haws MD Signature Date/Time: 11/10/2020/3:42:53 PM    Final    [place] EKG SITE RITE  Result Date: 11/11/2020 If Site Rite image not attached, placement could not be confirmed due to current cardiac rhythm.   Labs: BMET Recent Labs  Lab 11/10/20 0804 11/10/20 1508 11/10/20 2000 11/11/20 0035 11/11/20 1500 11/11/20 2202 11/12/20 0345  NA 166* 167* 164* 163* 159* 154* 150*  K 4.6 4.1 4.0 3.8 3.5 5.6* 3.5  CL >130* >130* >130* >130* >130* 129* 124*  CO2 20* 20* 17* 17* 17* 14* 17*  GLUCOSE 184* 152* 210* 212* 183* 160* 176*  BUN 89* 85* 85* 86* 74* 71* 62*  CREATININE 2.65* 2.58* 2.55* 2.46* 2.26* 2.45* 2.20*  CALCIUM 8.8* 8.5* 8.3* 8.6* 8.4* 8.0* 8.1*  PHOS  --  3.5  --   --   --   --   --    CBC Recent Labs  Lab 11/09/20 2112 11/10/20 0804 11/11/20 0035 11/12/20 0345  WBC 10.5 9.4 9.1 8.6  NEUTROABS 8.2* 8.3* 8.2* 7.2  HGB 17.0 14.9 12.8* 11.7*  HCT 56.9*  50.7 42.8 36.7*  MCV 91.8 92.5 91.6 89.5  PLT 397 318 252 208    Medications:    . Chlorhexidine Gluconate Cloth  6 each Topical Daily  . enoxaparin (LOVENOX) injection  90 mg Subcutaneous Q12H  . pantoprazole (PROTONIX) IV  40 mg Intravenous Q24H  . sodium chloride flush  10-40 mL Intracatheter Q12H     Jackelyn Poling, DO [facility] Family Medicine Resident, PGY2 11/12/2020, 6:54 AM

## 2020-11-12 NOTE — Progress Notes (Signed)
Initial Nutrition Assessment  DOCUMENTATION CODES:   Not applicable  INTERVENTION:  Provide Ensure Enlive po BID, each supplement provides 350 kcal and 20 grams of protein.  Encourage adequate PO intake.   NUTRITION DIAGNOSIS:   Increased nutrient needs related to catabolic illness (COVID) as evidenced by estimated needs.  GOAL:   Patient will meet greater than or equal to 90% of their needs  MONITOR:   PO intake,Supplement acceptance,Skin,Weight trends,Labs,I & O's  REASON FOR ASSESSMENT:   Malnutrition Screening Tool    ASSESSMENT:   70 y.o. male with medical history significant for chronic systolic CHF, CKD 3 who presents after being found on the floor at home. Pt found COVID positive. Severe hypernatremia after found down on the floor for unknown period of time   Meal completion has been 100%. Pt has been tolerating his PO well. Pt with no significant weight loss per weight records. RD to order nutritional supplements to aid in caloric and protein needs as well as in wound healing. Unable to complete Nutrition-Focused physical exam at this time.   Labs and medications reviewed.   Diet Order:   Diet Order            DIET DYS 3 Room service appropriate? Yes with Assist; Fluid consistency: Thin  Diet effective now                 EDUCATION NEEDS:   Not appropriate for education at this time  Skin:  Skin Assessment: Skin Integrity Issues: Skin Integrity Issues:: Unstageable Unstageable: buttocks  Last BM:  Unknown  Height:   Ht Readings from Last 1 Encounters:  11/09/20 6' (1.829 m)    Weight:   Wt Readings from Last 1 Encounters:  11/09/20 98.7 kg    BMI:  Body mass index is 29.51 kg/m.  Estimated Nutritional Needs:   Kcal:  2200-2400  Protein:  110-120 grams  Fluid:  >/= 2 L/day  Roslyn Smiling, MS, RD, LDN RD pager number/after hours weekend pager number on Amion.

## 2020-11-12 NOTE — Consult Note (Addendum)
WOC Nurse Consult Note: Patient receiving care in [facility] [phone]. Assisted with wound assessment by primary RN, Gilman Buttner, who was at the bedside while I consulted via [facility] camera. Patient is COVID +. Reason for Consult: gluteal ulcer Wound type: large unstageable wound to right buttock Pressure Injury POA: Yes Measurement: To be provided by the bedside RN in the flowsheet section Wound bed: black Drainage (amount, consistency, odor) none; no drainage expressed when Cassidy palpated around the wound Periwound: intact Dressing procedure/placement/frequency:   Apply Santyl to right buttock wound in a nickel thick layer. Cover with a saline moistened gauze, then dry gauze or ABD pad.  Change daily.  PT to apply after each episode of hydrotherapy. I have entered an order for PT hydrotherapy to wound. Thank you for the consult.  Discussed plan of care with the patient and bedside nurse.  WOC nurse will not follow at this time.  Please re-consult the WOC team if needed.  Helmut Muster, RN, MSN, CWOCN, CNS-BC, pager [phone]

## 2020-11-12 NOTE — TOC Initial Note (Signed)
Transition of Care [facility]) - Initial/Assessment Note    Patient Details  Name: Jeffery Christian MRN: [id number] Date of Birth: [birth year]  Transition [facility] Care [facility]) CM/SW Contact:    Mearl Latin, LCSW Phone Number: 11/12/2020, 2:19 PM  Clinical Narrative:                 CSW received consult for possible SNF placement at time of discharge. CSW spoke with patient. Patient reported that he lives alone and is currently unable to care for himself given patient's current physical needs and fall risk. Patient expressed understanding of PT recommendation and may be agreeable to SNF placement at time of discharge but requests CSW check back in once he is closer to being ready. He gave permission for CSW to send referral to COVID SNFs. CSW discussed insurance authorization process and provided Medicare SNF ratings list. Patient has not received the COVID vaccines. Patient expressed wanting to leave the hospital as soon as possible.   Expected Discharge Plan: Skilled Nursing Facility Barriers to Discharge: Continued Medical Work up,SNF Covid   Patient Goals and CMS Choice Patient states their goals for this hospitalization and ongoing recovery are:: Rehab CMS Medicare.gov Compare Post Acute Care list provided to:: Patient Choice offered to / list presented to : Patient  Expected Discharge Plan and Services Expected Discharge Plan: Skilled Nursing Facility In-house Referral: Clinical Social Work   Post Acute Care Choice: Skilled Nursing Facility Living arrangements for the past 2 months: Single Family Home                                      Prior Living Arrangements/Services Living arrangements for the past 2 months: Single Family Home Lives with:: Self Patient language and need for interpreter reviewed:: Yes Do you feel safe going back to the place where you live?: Yes      Need for Family Participation in Patient Care: Yes (Comment) Care giver support system in place?: No (comment)    Criminal Activity/Legal Involvement Pertinent to Current Situation/Hospitalization: No - Comment as needed  Activities of Daily Living Home Assistive Devices/Equipment: None ADL Screening (condition at time of admission) Patient's cognitive ability adequate to safely complete daily activities?: No Is the patient deaf or have difficulty hearing?: No Does the patient have difficulty seeing, even when wearing glasses/contacts?: No Does the patient have difficulty concentrating, remembering, or making decisions?: Yes Patient able to express need for assistance with ADLs?: No Does the patient have difficulty dressing or bathing?: Yes Independently performs ADLs?: No Communication: Dependent Is this a change from baseline?: Change from baseline, expected to last >3 days Dressing (OT): Dependent Is this a change from baseline?: Change from baseline, expected to last >3 days Grooming: Dependent Is this a change from baseline?: Change from baseline, expected to last >3 days Feeding: Dependent Is this a change from baseline?: Change from baseline, expected to last >3 days Bathing: Dependent Is this a change from baseline?: Change from baseline, expected to last >3 days Toileting: Dependent Is this a change from baseline?: Change from baseline, expected to last >3days In/Out Bed: Dependent Is this a change from baseline?: Change from baseline, expected to last >3 days Walks in Home: Dependent Is this a change from baseline?: Change from baseline, expected to last >3 days Does the patient have difficulty walking or climbing stairs?: Yes Weakness of Legs: Both Weakness of Arms/Hands: Both  Permission Sought/Granted  Permission sought to share information with : Facility Contractor granted to share information with : Yes, Verbal Permission Granted     Permission granted to share info w AGENCY: SNFs        Emotional Assessment    Attitude/Demeanor/Rapport: Engaged Affect (typically observed): Accepting,Appropriate Orientation: : Oriented to Self,Oriented to Place,Oriented to  Time,Oriented to Situation Alcohol / Substance Use: Not Applicable Psych Involvement: No (comment)  Admission diagnosis:  Trauma [T14.90XA] AKI (acute kidney injury) (HCC) [N17.9] Atrial fibrillation with RVR (HCC) [I48.91] Altered mental status, unspecified altered mental status type [R41.82] Atrial fibrillation, unspecified type [facility]) [I48.91] Patient Active Problem List   Diagnosis Date Noted  . Acute renal failure superimposed on stage 3 chronic kidney disease (HCC) 11/09/2020  . Acute metabolic encephalopathy 11/09/2020  . Hypernatremia 11/09/2020  . Elevated troponin 11/09/2020  . COVID-19 virus infection 11/09/2020  . Hypertension 01/07/2018  . Atrial fibrillation with RVR (HCC) 08/05/2017  . CKD (chronic kidney disease), stage III (HCC) 08/05/2017  . Persistent atrial fibrillation (HCC)   . Chronic systolic CHF (congestive heart failure) (HCC)   . Dilated cardiomyopathy (HCC)   . Shortness of breath   . Gout 07/18/2017  . Left buttock abscess 03/22/2014   PCP:  Hoy Register, MD Pharmacy:   [facility] - [place], [place] - [address] [address] Phone: [phone] Fax: [phone]  [id number] [place], [place] - [address] [place] [address] Phone: [phone] Fax: [phone]     Social Determinants of Health (SDOH) Interventions    Readmission Risk Interventions No flowsheet data found.

## 2020-11-12 NOTE — Progress Notes (Signed)
Cross-coverage note:   Patient with metabolic encephalopathy has been agitated tonight, pulling lines, threatening staff. Non-pharmacologic interventions were not successful and he did not respond to Haldol. Plan to give one dose Ativan now for safety of patient and staff.

## 2020-11-12 NOTE — NC FL2 (Signed)
[place] MEDICAID FL2 LEVEL OF CARE SCREENING TOOL     IDENTIFICATION  Patient Name: Jeffery Christian Birthdate: [birth year] Sex: male Admission Date (Current Location): 11/09/2020  [facility] and [place] Number:  Producer, television/film/video and Address:  [facility] [place], [address] [place], [place] [address]      Provider Number: [id number]  Attending Physician Name and Address:  Starleen Arms, MD  Relative Name and Phone Number:  Runge, daughter, [phone]    Current Level of Care: Hospital Recommended Level of Care: Skilled Nursing Facility Prior Approval Number:    Date Approved/Denied:   PASRR Number: [id number]  Discharge Plan: SNF    Current Diagnoses: Patient Active Problem List   Diagnosis Date Noted  . Acute renal failure superimposed on stage 3 chronic kidney disease (HCC) 11/09/2020  . Acute metabolic encephalopathy 11/09/2020  . Hypernatremia 11/09/2020  . Elevated troponin 11/09/2020  . COVID-19 virus infection 11/09/2020  . Hypertension 01/07/2018  . Atrial fibrillation with RVR (HCC) 08/05/2017  . CKD (chronic kidney disease), stage III (HCC) 08/05/2017  . Persistent atrial fibrillation (HCC)   . Chronic systolic CHF (congestive heart failure) (HCC)   . Dilated cardiomyopathy (HCC)   . Shortness of breath   . Gout 07/18/2017  . Left buttock abscess 03/22/2014    Orientation RESPIRATION BLADDER Height & Weight     Self,Time,Situation,Place  Normal Incontinent,Indwelling catheter Weight: 217 lb 9.5 oz (98.7 kg) Height:  6' (182.9 cm)  BEHAVIORAL SYMPTOMS/MOOD NEUROLOGICAL BOWEL NUTRITION STATUS      Continent Diet (Please see DC Summary)  AMBULATORY STATUS COMMUNICATION OF NEEDS Skin   Extensive Assist Verbally Other (Comment) (Scratch with foam dressing)                       Personal Care Assistance Level of Assistance  Bathing,Feeding,Dressing Bathing Assistance: Maximum assistance Feeding assistance: Limited  assistance Dressing Assistance: Limited assistance     Functional Limitations Info             SPECIAL CARE FACTORS FREQUENCY  PT (By licensed PT),OT (By licensed OT)     PT Frequency: 5x/week OT Frequency: 5x/week            Contractures Contractures Info: Not present    Additional Factors Info  Code Status,Allergies,Isolation Precautions Code Status Info: Full Allergies Info: NKA     Isolation Precautions Info: COVID + 2/8     Current Medications (11/12/2020):  This is the current hospital active medication list Current Facility-Administered Medications  Medication Dose Route Frequency Provider Last Rate Last Admin  . 0.9 %  sodium chloride infusion   Intravenous Continuous Elgergawy, Leana Roe, MD 75 mL/hr at 11/12/20 0747 New Bag at 11/12/20 0747  . acetaminophen (TYLENOL) suppository 650 mg  650 mg Rectal Q6H PRN Chotiner, Claudean Severance, MD      . albuterol (VENTOLIN HFA) 108 (90 Base) MCG/ACT inhaler 2 puff  2 puff Inhalation Q4H PRN Chotiner, Claudean Severance, MD      . cefTRIAXone (ROCEPHIN) 1 g in sodium chloride 0.9 % 100 mL IVPB  1 g Intravenous Q24H Elgergawy, Leana Roe, MD 200 mL/hr at 11/12/20 0745 1 g at 11/12/20 0745  . Chlorhexidine Gluconate Cloth 2 % PADS 6 each  6 each Topical Daily Elgergawy, Leana Roe, MD   6 each at 11/12/20 0911  . diltiazem (CARDIZEM) 125 mg in dextrose 5% 125 mL (1 mg/mL) infusion  5-15 mg/hr Intravenous Continuous Elgergawy, Mliss Fritz  S, MD 7.5 mL/hr at 11/11/20 2311 7.5 mg/hr at 11/11/20 2311  . enoxaparin (LOVENOX) injection 90 mg  90 mg Subcutaneous Q12H Elgergawy, Leana Roe, MD   90 mg at 11/11/20 2313  . pantoprazole (PROTONIX) injection 40 mg  40 mg Intravenous Q24H Elgergawy, Leana Roe, MD   40 mg at 11/11/20 1354  . remdesivir 100 mg in sodium chloride 0.9 % 100 mL IVPB  100 mg Intravenous Daily Chotiner, Claudean Severance, MD 200 mL/hr at 11/12/20 0914 100 mg at 11/12/20 0914  . sodium chloride flush (NS) 0.9 % injection 10-40 mL  10-40 mL  Intracatheter Q12H Elgergawy, Leana Roe, MD   10 mL at 11/11/20 1349  . sodium chloride flush (NS) 0.9 % injection 10-40 mL  10-40 mL Intracatheter PRN Elgergawy, Leana Roe, MD         Discharge Medications: Please see discharge summary for a list of discharge medications.  Relevant Imaging Results:  Relevant Lab Results:   Additional Information SSN: [id number]. Unvaccinated  Mearl Latin, LCSW

## 2020-11-12 NOTE — Progress Notes (Signed)
EEG completed, results pending. 

## 2020-11-12 NOTE — Progress Notes (Signed)
Patient's RUE has gotten more swollen and bruised throughout the shift. Upon chart review, it appears patient had an IV infiltrate in this location yesterday. Arm is not warm and pt denies any pain. Photo uploaded to chart for reference.

## 2020-11-12 NOTE — Procedures (Signed)
Patient Name: Jeffery Christian  MRN: [id number]  Epilepsy Attending: Charlsie Quest  Referring Physician/Provider: Dr Caryl Pina Date: 11/12/2020  Duration: 23.19 mins  Patient history: 70 yo M with ams. EEG to evaluate for seizure  Level of alertness: Awake, drowsy, sleep, comatose, lethargic  AEDs during EEG study: None  Technical aspects: This EEG study was done with scalp electrodes positioned according to the 10-20 International system of electrode placement. Electrical activity was acquired at a sampling rate of 500Hz  and reviewed with a high frequency filter of 70Hz  and a low frequency filter of 1Hz . EEG data were recorded continuously and digitally stored.   Description: The posterior dominant rhythm consists of 8-9 Hz activity of moderate voltage (25-35 uV) seen predominantly in posterior head regions, symmetric and reactive to eye opening and eye closing. Hyperventilation and photic stimulation were not performed.     IMPRESSION: This study is within normal limits. No seizures or epileptiform discharges were seen throughout the recording.  [name] 

## 2020-11-12 NOTE — Progress Notes (Signed)
  Speech Language Pathology Treatment: Dysphagia  Patient Details Name: Jeffery Christian MRN: [id number] DOB: 10-04-1950 Today's Date: 11/12/2020 Time: 5009-3818 SLP Time Calculation (min) (ACUTE ONLY): 23 min  Assessment / Plan / Recommendation Clinical Impression  Pt was seen for dysphagia treatment. Pt was alert and cooperative during the session. Pt's NT reported that the pt has been tolerating the current diet without overt s/sx of aspiration. Pt tolerated puree solids and regular texture solids without overt s/sx of aspiration. Mastication was [facility] and no significant oral residue was noted. Pt independently used liquid washes and this strategy was noted to be effective during the study in reducing pharyngeal residue. Pt inconsistently exhibited coughing with thin liquids via straw and this appeared to relate to larger bolus sizes. No s/sx of aspiration were noted with thin liquids via cup. Pt's diet will be advanced to regular texture solids and thin liquids. SLP will follow to ensure tolerance.    HPI HPI: Pt is a 70 y.o. male with medical history significant for atrial fibrillation on Eliquis-last dose unknown, dilated cardiomyopathy, chronic systolic CHF, CKD 3 who presented after being found on the floor at home by his friends who had last seen him one week prior. EMT noticed increased weakness on the left and he was brought in as a code stroke; this was ultimately cancelled by neurology. MRI was negative for acute changes. Pt found to have COVID-19. CXR 2/8 was negative.      SLP Plan  Continue with current plan of care       Recommendations  Diet recommendations: Thin liquid;Regular Liquids provided via: Cup;No straw Medication Administration: Whole meds with puree Supervision: Patient able to self feed;Full supervision/cueing for compensatory strategies Compensations: Slow rate;Small sips/bites Postural Changes and/or Swallow Maneuvers: Seated upright 90 degrees                 Oral Care Recommendations: Oral care BID Follow up Recommendations:  (TBD) SLP Visit Diagnosis: Dysphagia, unspecified (R13.10) Plan: Continue with current plan of care       Fortunato Nordin I. Vear Clock, MS, CCC-SLP Acute Rehabilitation Services Office number [phone] Pager [phone]                Scheryl Marten 11/12/2020, 12:35 PM

## 2020-11-12 NOTE — Progress Notes (Signed)
PROGRESS NOTE                                                                             PROGRESS NOTE                                                                                                                                                                                                             Patient Demographics:    Jeffery Christian, is a 70 y.o. male, DOB - 1950-12-07, VXY:[id number]  Outpatient Primary MD for the patient is Hoy Register, MD    LOS - 3  Admit date - 11/09/2020    Chief Complaint  Patient presents with  . Code Stroke       Brief Narrative     HPI: Jeffery Christian is a 70 y.o. male with medical history significant for atrial fibrillation on Eliquis-last dose unknown, dilated cardiomyopathy, chronic systolic CHF, CKD 3 who presents by EMS after being found on the floor at home by his friends.  He was last seen about 1 week ago by his friends.  His friends not heard from him in the last week so they went to his house to check on him and upon getting no response at his door, knocked the door down and found him down on the ground between a dresser in the bed.  He was not talking at the time. He was weak all over but the EMTs noted him to be weaker on the left in comparison to the right and brought to ER as a code stroke.  He was unable to provide any history. He was able to follow simple commands He had a strong smell of urine and EMTs reported urine and stool around him when they picked him up from his house.  ED Course: He was found to have elevated heart rate in the 1 60-1 80 range.  EKG showed atrial fibrillation with RVR.  He was started on Cardizem infusion in the emergency room but continues to have elevated heart rate.  CT the  head was negative for acute stroke.  Neurology was consulted and has evaluated patient.  Patient was found to be hypernatremic on labs with sodium of 168. Troponin was elevated at 56    Subjective:     Jeffery Christian today denies he is more awake and appropriate, he reports right knee pain, otherwise denies any other complaints .   Assessment  & Plan :    Principal Problem:   Atrial fibrillation with RVR (HCC) Active Problems:   Acute renal failure superimposed on stage 3 chronic kidney disease (HCC)   Acute metabolic encephalopathy   Hypernatremia   Elevated troponin   COVID-19 virus infection  Hypernatremia -Severe, due to hypovolemia, in the setting of volume depletion and dehydration. -Improving gradually, now he is able to take oral intake IV fluids has been stopped.  COVID-19 infection -Patient tells me he is vaccinated earlier last year, but not boosted -No evidence of pneumonia on x-ray, no  oxygen requirement -Refill with Remdesivir, no indication for steroids -Will need isolation for 10 days total  Acute metabolic encephalopathy: -Significantly improved -MRI incomplete study, no evidence of acute CVA -as well in the  setting of hypernatremia - neurology consult appreciated - -EG within normal limits, no seizures or epileptiform discharges  AKI on CKD stage III: -Due to volume depletion and dehydration -Improving with IV fluids -CK within normal limit. -Renal ultrasound with no hydronephrosis -Bicarb of 17, will start on p.o. bicarb.  A fib with RVR -With poor control, he is transition to Cardizem drip with better control, now he is able to take oral he will be started on p.o. metoprolol hopefully to wean off Cardizem drip . -On Eliquis at home, he is on Lovenox during hospital stay.    Hypertension:  -Blood pressure acceptable, monitor now he is on Cardizem drip .  And is being resumed on metoprolol  Hyperkalemia:  -Improving  UTI -Continue with Rocephin x3 days, urine culture unhelpful as growing multiple species.  Pressure ulcer -This is present on admission, and right buttock area, as he was on the floor for unknown period of time, wound care will  be consulted.  Pressure Injury Buttocks Right Unstageable - Full thickness tissue loss in which the base of the injury is covered by slough (yellow, tan, gray, green or brown) and/or eschar (tan, brown or black) in the wound bed. (Active)     Location: Buttocks  Location Orientation: Right  Staging: Unstageable - Full thickness tissue loss in which the base of the injury is covered by slough (yellow, tan, gray, green or brown) and/or eschar (tan, brown or black) in the wound bed.  Wound Description (Comments):   Present on Admission: Yes   Wounds at glans penis, around Foley insertion site -Discussed with urology Dr. Liliane ShiWinter, he recommends local wound care including Neosporin and washing with soap and water, and outpatient follow-up with him once stable. -Foley catheter has been discontinued, but if patient develops any signs of scarring or retention will need Foley catheter back in.       SpO2: 99 % O2 Flow Rate (L/min): 3 L/min  Recent Labs  Lab 11/09/20 2112 11/09/20 2113 11/09/20 2120 11/09/20 2242 11/10/20 0051 11/10/20 0804 11/10/20 1508 11/11/20 0035 11/12/20 0345  WBC 10.5  --   --   --   --  9.4  --  9.1 8.6  PLT 397  --   --   --   --  318  --  252 208  CRP  --   --   --   --   --  2.7*  --  2.8* 2.0*  AST 32  --   --   --   --  21  --  24 27  ALT 28  --   --   --   --  25  --  24 26  ALKPHOS 72  --   --   --   --  62  --  56 56  BILITOT 2.5*  --   --   --   --  1.4*  --  1.0 0.6  ALBUMIN 2.9*  --   --   --   --  2.5* 2.6* 2.3* 2.2*  INR  --   --   --  1.4*  --   --   --   --   --   LATICACIDVEN  --   --  2.4*  --  3.2*  --   --   --   --   SARSCOV2NAA  --  POSITIVE*  --   --   --   --   --   --   --        ABG  No results found for: PHART, PCO2ART, PO2ART, HCO3, TCO2, ACIDBASEDEF, O2SAT     Condition - Extremely Guarded  Family Communication  :  D/W daughter by phone 2/8,2/9  Code Status :  full  Consults  :  Renal, cardiology  Disposition  Plan  :    Status is: Inpatient  Remains inpatient appropriate because:Hemodynamically unstable, Unsafe d/c plan and IV treatments appropriate due to intensity of illness or inability to take PO   Dispo: The patient is from: Home              Anticipated d/c is to: SNF              Anticipated d/c date is: > 3 days              Patient currently is not medically stable to d/c.   Difficult to place patient Yes      DVT Prophylaxis  :   Heparin GTT>> lovenox  Lab Results  Component Value Date   PLT 208 11/12/2020    Diet :  Diet Order            DIET DYS 3 Room service appropriate? Yes with Assist; Fluid consistency: Thin  Diet effective now                  Inpatient Medications  Scheduled Meds: . Chlorhexidine Gluconate Cloth  6 each Topical Daily  . enoxaparin (LOVENOX) injection  90 mg Subcutaneous Q12H  . pantoprazole (PROTONIX) IV  40 mg Intravenous Q24H  . sodium chloride flush  10-40 mL Intracatheter Q12H   Continuous Infusions: . cefTRIAXone (ROCEPHIN)  IV 1 g (11/12/20 0745)  . diltiazem (CARDIZEM) infusion 7.5 mg/hr (11/11/20 2311)  . remdesivir 100 mg in NS 100 mL 100 mg (11/12/20 0914)   PRN Meds:.acetaminophen, albuterol, sodium chloride flush  Antibiotics  :    Anti-infectives (From admission, onward)   Start     Dose/Rate Route Frequency Ordered Stop   11/11/20 1000  remdesivir 100 mg in sodium chloride 0.9 % 100 mL IVPB  Status:  Discontinued       "Followed by" Linked Group Details   100 mg 200 mL/hr over 30 Minutes Intravenous Daily 11/10/20 0101 11/10/20 0103   11/11/20 0800  cefTRIAXone (ROCEPHIN) 1 g in sodium chloride 0.9 % 100 mL IVPB  1 g 200 mL/hr over 30 Minutes Intravenous Every 24 hours 11/11/20 0732     11/10/20 1600  remdesivir 100 mg in sodium chloride 0.9 % 100 mL IVPB        100 mg 200 mL/hr over 30 Minutes Intravenous Daily 11/09/20 2346 11/14/20 0959   11/10/20 0000  remdesivir 200 mg in sodium chloride 0.9% 250 mL  IVPB        200 mg 580 mL/hr over 30 Minutes Intravenous Once 11/09/20 2346 11/10/20 0441   11/09/20 2345  remdesivir 200 mg in sodium chloride 0.9% 250 mL IVPB  Status:  Discontinued       "Followed by" Linked Group Details   200 mg 580 mL/hr over 30 Minutes Intravenous Once 11/10/20 0101 11/10/20 0103       Ishika Chesterfield M.D on 11/12/2020 at 11:55 AM  To page go to [email or web address]  [facility] -  Office  [phone]      Objective:   Vitals:   11/11/20 2025 11/11/20 2355 11/12/20 0400 11/12/20 0746  BP: 130/64 111/90 131/84 126/71  Pulse: 88 97 89 92  Resp: 19 15 14 17   Temp: 98 F (36.7 C) 98 F (36.7 C) 98.1 F (36.7 C) 98.2 F (36.8 C)  TempSrc: Axillary Oral Oral Oral  SpO2: 95% 91% 98% 99%  Weight:      Height:        Wt Readings from Last 3 Encounters:  11/09/20 98.7 kg  09/08/20 98.7 kg  06/08/20 93.9 kg     Intake/Output Summary (Last 24 hours) at 11/12/2020 1155 Last data filed at 11/12/2020 1132 Gross per 24 hour  Intake 2005.7 ml  Output 1425 ml  Net 580.7 ml     Physical Exam  Patient is more awake, alert and appropriate, more coherent, answering most of the questions appropriately, follow commands . Symmetrical Chest wall movement, Good air movement bilaterally, CTAB RRR,No Gallops,Rubs or new Murmurs, No Parasternal Heave +ve B.Sounds, Abd Soft, No tenderness, No rebound - guarding or rigidity. Right gluteal pressure ulcer, and ulceration around glans penis at Foley insertion site Right knee pain limiting activity.         Data Review:    CBC Recent Labs  Lab 11/09/20 2112 11/10/20 0804 11/11/20 0035 11/12/20 0345  WBC 10.5 9.4 9.1 8.6  HGB 17.0 14.9 12.8* 11.7*  HCT 56.9* 50.7 42.8 36.7*  PLT 397 318 252 208  MCV 91.8 92.5 91.6 89.5  MCH 27.4 27.2 27.4 28.5  MCHC 29.9* 29.4* 29.9* 31.9  RDW 17.8* 16.8* 16.6* 16.7*  LYMPHSABS 1.5 0.6* 0.5* 0.7  MONOABS 0.8 0.5 0.4 0.6  EOSABS 0.0 0.0 0.0 0.0  BASOSABS 0.0  0.0 0.0 0.0    Recent Labs  Lab 11/09/20 2112 11/09/20 2120 11/09/20 2242 11/10/20 0051 11/10/20 0804 11/10/20 1508 11/10/20 2000 11/11/20 0035 11/11/20 1500 11/11/20 2202 11/12/20 0345  NA 168*  --   --   --  166* 167* 164* 163* 159* 154* 150*  K 5.4*  --   --   --  4.6 4.1 4.0 3.8 3.5 5.6* 3.5  CL 128*  --   --   --  >130* >130* >130* >130* >130* 129* 124*  CO2 21*  --   --   --  20* 20* 17* 17* 17* 14* 17*  GLUCOSE 157*  --   --   --  184* 152* 210* 212* 183* 160* 176*  BUN 101*  --   --   --  89* 85* 85* 86* 74* 71* 62*  CREATININE 3.10*  --   --   --  2.65* 2.58* 2.55* 2.46* 2.26* 2.45* 2.20*  CALCIUM 9.7  --   --   --  8.8* 8.5* 8.3* 8.6* 8.4* 8.0* 8.1*  AST 32  --   --   --  21  --   --  24  --   --  27  ALT 28  --   --   --  25  --   --  24  --   --  26  ALKPHOS 72  --   --   --  62  --   --  56  --   --  56  BILITOT 2.5*  --   --   --  1.4*  --   --  1.0  --   --  0.6  ALBUMIN 2.9*  --   --   --  2.5* 2.6*  --  2.3*  --   --  2.2*  CRP  --   --   --   --  2.7*  --   --  2.8*  --   --  2.0*  LATICACIDVEN  --  2.4*  --  3.2*  --   --   --   --   --   --   --   INR  --   --  1.4*  --   --   --   --   --   --   --   --     ------------------------------------------------------------------------------------------------------------------ No results for input(s): CHOL, HDL, LDLCALC, TRIG, CHOLHDL, LDLDIRECT in the last 72 hours.  Lab Results  Component Value Date   HGBA1C 5.3 06/08/2020   ------------------------------------------------------------------------------------------------------------------ No results for input(s): TSH, T4TOTAL, T3FREE, THYROIDAB in the last 72 hours.  Invalid input(s): FREET3  Cardiac Enzymes No results for input(s): CKMB, TROPONINI, MYOGLOBIN in the last 168 hours.  Invalid input(s): CK ------------------------------------------------------------------------------------------------------------------    Component Value Date/Time    BNP 1,922.3 (H) 08/04/2017 2132    Micro Results Recent Results (from the past 240 hour(s))  Resp Panel by RT-PCR (Flu A&B, Covid) Nasopharyngeal Swab     Status: Abnormal   Collection Time: 11/09/20  9:13 PM   Specimen: Nasopharyngeal Swab; Nasopharyngeal(NP) swabs in vial transport medium  Result Value Ref Range Status   SARS Coronavirus 2 by RT PCR POSITIVE (A) NEGATIVE Final    Comment: RESULT CALLED TO, READ BACK BY AND VERIFIED WITH: Minus Breeding RN 11/09/20 2227 JDW (NOTE) SARS-CoV-2 target nucleic acids are DETECTED.  The SARS-CoV-2 RNA is generally detectable in upper respiratory specimens during the acute phase of infection. Positive results are indicative of the presence of the identified virus, but do not rule out bacterial infection or co-infection with other pathogens not detected by the test. Clinical correlation with patient history and other diagnostic information is necessary to determine patient infection status. The expected result is Negative.  Fact Sheet for Patients: BloggerCourse.com  Fact Sheet for Healthcare Providers: SeriousBroker.it  This test is not yet approved or cleared by the [place] FDA and  has been authorized for detection and/or diagnosis of SARS-CoV-2 by FDA under an Emergency Use Authorization (EUA).  This EUA will remain in effect (meaning this test can be used)  for the duration of  the COVID-19 declaration under Section 564(b)(1) of the Act, 21 U.S.C. section 360bbb-3(b)(1), unless the authorization is terminated or revoked sooner.  Influenza A by PCR NEGATIVE NEGATIVE Final   Influenza B by PCR NEGATIVE NEGATIVE Final    Comment: (NOTE) The Xpert Xpress SARS-CoV-2/FLU/RSV plus assay is intended as an aid in the diagnosis of influenza from Nasopharyngeal swab specimens and should not be used as a sole basis for treatment. Nasal washings and aspirates are unacceptable for Xpert  Xpress SARS-CoV-2/FLU/RSV testing.  Fact Sheet for Patients: BloggerCourse.com  Fact Sheet for Healthcare Providers: SeriousBroker.it  This test is not yet approved or cleared by the [place] FDA and has been authorized for detection and/or diagnosis of SARS-CoV-2 by FDA under an Emergency Use Authorization (EUA). This EUA will remain in effect (meaning this test can be used) for the duration of the COVID-19 declaration under Section 564(b)(1) of the Act, 21 U.S.C. section 360bbb-3(b)(1), unless the authorization is terminated or revoked.  Performed at [facility], [address] [place], [place] [address]   Culture, Urine     Status: Abnormal   Collection Time: 11/11/20  7:34 AM   Specimen: Urine, Random  Result Value Ref Range Status   Specimen Description URINE, RANDOM  Final   Special Requests   Final    NONE Performed at [facility], [address], [place], [place] [address]    Culture MULTIPLE SPECIES PRESENT, SUGGEST RECOLLECTION (A)  Final   Report Status 11/12/2020 FINAL  Final    Radiology Reports EEG  Result Date: 11/12/2020 Charlsie Quest, MD     11/12/2020 10:37 AM Patient Name: Adriano Bischof MRN: [id number] Epilepsy Attending: Charlsie Quest Referring Physician/Provider: Dr Caryl Pina Date: 11/12/2020 Duration: 23.19 mins Patient history: 70 yo M with ams. EEG to evaluate for seizure Level of alertness: Awake, drowsy, sleep, comatose, lethargic AEDs during EEG study: None Technical aspects: This EEG study was done with scalp electrodes positioned according to the 10-20 International system of electrode placement. Electrical activity was acquired at a sampling rate of 500Hz  and reviewed with a high frequency filter of 70Hz  and a low frequency filter of 1Hz . EEG data were recorded continuously and digitally stored. Description: The posterior dominant rhythm consists of 8-9 Hz activity of moderate  voltage (25-35 uV) seen predominantly in posterior head regions, symmetric and reactive to eye opening and eye closing. Hyperventilation and photic stimulation were not performed.   IMPRESSION: This study is within normal limits. No seizures or epileptiform discharges were seen throughout the recording. Charlsie Quest   CT Code Stroke CTA Head W/WO contrast  Result Date: 11/09/2020 CLINICAL DATA:  Initial evaluation for acute stroke, left-sided weakness. EXAM: CT ANGIOGRAPHY HEAD AND NECK CT PERFUSION BRAIN TECHNIQUE: Multidetector CT imaging of the head and neck was performed using the standard protocol during bolus administration of intravenous contrast. Multiplanar CT image reconstructions and MIPs were obtained to evaluate the vascular anatomy. Carotid stenosis measurements (when applicable) are obtained utilizing NASCET criteria, using the distal internal carotid diameter as the denominator. Multiphase CT imaging of the brain was performed following IV bolus contrast injection. Subsequent parametric perfusion maps were calculated using RAPID software. CONTRAST:  100 cc of Omnipaque 350. COMPARISON:  Prior head CT from earlier the same day. FINDINGS: CTA NECK FINDINGS Aortic arch: Visualized aortic arch of normal caliber with normal branch pattern. No hemodynamically significant stenosis about the origin of the great vessels. Visualized subclavian arteries widely patent. Right carotid system: Right common and internal carotid arteries widely patent without stenosis, dissection or occlusion. Left carotid system: Left CCA patent from  its origin to the bifurcation without stenosis. Mild eccentric soft plaque at the origin of the left ICA without significant stenosis. Left ICA patent distally without stenosis, dissection or occlusion. Vertebral arteries: Both vertebral arteries arise from the subclavian arteries. Dominant left vertebral artery with a diffusely hypoplastic right vertebral artery. Left vertebral  artery widely patent within the neck without stenosis or other abnormality. Severe stenosis at the origin of the right vertebral artery. Hypoplastic right vertebral artery otherwise patent within the neck without stenosis or other abnormality. Skeleton: No visible acute osseous abnormality, although cervical spine better evaluated on concomitant CT of the cervical spine. No discrete or worrisome osseous lesions. Other neck: No other acute soft tissue abnormality within the neck. No mass or adenopathy. Upper chest: Scattered atelectatic changes noted within the visualized lungs. Few scattered superimposed tree-in-bud nodular densities within the peripheral right upper lobe could reflect changes of acute bronchiolitis and/or mucoid impaction. No frank airspace consolidation. Review of the MIP images confirms the above findings CTA HEAD FINDINGS Anterior circulation: Petrous, cavernous, and supraclinoid segments of both internal carotid arteries are widely patent without stenosis. A1 segments patent bilaterally. Normal anterior communicating artery. 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 Finding presumably chronic in nature given the chronic bilateral ACA territory infarcts seen on prior noncontrast head CT. Scant distal reconstitution, likely collateral. M1 segments patent bilaterally. Normal MCA bifurcations. Distal MCA branches well perfused and symmetric. Posterior circulation: Dominant left vertebral artery widely patent to the vertebrobasilar junction. Left PICA patent. Hypoplastic right vertebral artery grossly patent to the vertebrobasilar junction as well. Right PICA not visualized. Basilar patent to its distal aspect without stenosis. Superior cerebellar arteries patent bilaterally. Fetal type origin of the left PCA. Right PCA supplied via the basilar as well as a robust right posterior communicating artery. Scattered atheromatous irregularity within both PCAs without high-grade stenosis.  Short-segment mild right P2 stenosis noted (series 10, image 19). Venous sinuses: Not well assessed due to timing of the contrast bolus. Anatomic variants: Fetal type origin of the left PCA. Hypoplastic right vertebral artery. 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 Review of the MIP images confirms the above findings CT Brain Perfusion Findings: ASPECTS: 10 CBF (<30%) Volume: 0mL Perfusion (Tmax>6.0s) volume: 18mL Mismatch Volume: 18mL Infarction Location:Negative CT perfusion for acute ischemia. Delayed perfusion seen within the right ACA distribution, in keeping with the chronic ACA occlusion. IMPRESSION: CTA HEAD AND NECK IMPRESSION: 1. Negative CTA for emergent large vessel occlusion. 2. 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 3. Scattered mild for age atheromatous change elsewhere about the major arterial vasculature of the head and neck. No other hemodynamically significant or correctable stenosis. 4. Few scattered tree-in-bud nodular densities within right upper lobe, nonspecific, but could reflect changes of acute bronchiolitis and/or mucoid impaction. No frank airspace consolidation. CT PERFUSION IMPRESSION: 1. Negative CT perfusion for acute ischemia. 2. 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 Critical Value/emergent results were called by telephone at the time of interpretation on 11/09/2020 at 9:45 pm to provider [facility] , who verbally acknowledged these results. Electronically Signed   By: Rise Mu M.D.   On: 11/09/2020 22:11   DG Pelvis 1-2 Views  Result Date: 11/09/2020 CLINICAL DATA:  Fall EXAM: PELVIS - 1-2 VIEW COMPARISON:  CT 03/22/2014 FINDINGS: Bones of the pelvis appear intact and congruent. Proximal femora intact and normally located within the acetabula.  Moderate degenerative changes noted throughout the lower lumbar  spine, bilateral SI joints, and bilateral hips. Some mild left hip soft tissue swelling is present. Finding on a background of diffuse mild body wall edema. Some hyperdense likely excreted contrast media seen within the urinary bladder. Slightly lobular, crenulated bladder contours and possible diverticular outpouchings, can be seen in the setting chronic outlet obstruction particularly in this patient with a history of prostatomegaly seen on comparison pelvic CT. No other acute or suspicious soft tissue abnormalities. IMPRESSION: 1. Mild left hip soft tissue swelling without acute fracture or traumatic malalignment. 2. Moderate degenerative changes throughout the spine, bilateral SI joints and hips. 3. Bladder opacified by excreted contrast media. Slightly lobular, crenulated bladder contours and possible diverticular outpouchings, can be seen in the setting of chronic outlet obstruction particularly in this patient with a history of prostatomegaly. Electronically Signed   By: Kreg Shropshire M.D.   On: 11/09/2020 22:32   DG Knee 1-2 Views Right  Result Date: 11/10/2020 CLINICAL DATA:  Found down, tender to palpation EXAM: RIGHT KNEE - 1-2 VIEW COMPARISON:  None. FINDINGS: Frontal and lateral views of the right knee demonstrate 3 compartmental osteoarthritis most severe in the medial compartment. No acute fracture, subluxation, or dislocation. No joint effusion. IMPRESSION: 1. Three compartmental osteoarthritis.  No acute fracture. Electronically Signed   By: Sharlet Salina M.D.   On: 11/10/2020 19:12   CT Code Stroke CTA Neck W/WO contrast  Result Date: 11/09/2020 CLINICAL DATA:  Initial evaluation for acute stroke, left-sided weakness. EXAM: CT ANGIOGRAPHY HEAD AND NECK CT PERFUSION BRAIN TECHNIQUE: Multidetector CT imaging of the head and neck was performed using the standard protocol during bolus administration of intravenous contrast. Multiplanar CT image reconstructions and MIPs were obtained to evaluate  the vascular anatomy. Carotid stenosis measurements (when applicable) are obtained utilizing NASCET criteria, using the distal internal carotid diameter as the denominator. Multiphase CT imaging of the brain was performed following IV bolus contrast injection. Subsequent parametric perfusion maps were calculated using RAPID software. CONTRAST:  100 cc of Omnipaque 350. COMPARISON:  Prior head CT from earlier the same day. FINDINGS: CTA NECK FINDINGS Aortic arch: Visualized aortic arch of normal caliber with normal branch pattern. No hemodynamically significant stenosis about the origin of the great vessels. Visualized subclavian arteries widely patent. Right carotid system: Right common and internal carotid arteries widely patent without stenosis, dissection or occlusion. Left carotid system: Left CCA patent from its origin to the bifurcation without stenosis. Mild eccentric soft plaque at the origin of the left ICA without significant stenosis. Left ICA patent distally without stenosis, dissection or occlusion. Vertebral arteries: Both vertebral arteries arise from the subclavian arteries. Dominant left vertebral artery with a diffusely hypoplastic right vertebral artery. Left vertebral artery widely patent within the neck without stenosis or other abnormality. Severe stenosis at the origin of the right vertebral artery. Hypoplastic right vertebral artery otherwise patent within the neck without stenosis or other abnormality. Skeleton: No visible acute osseous abnormality, although cervical spine better evaluated on concomitant CT of the cervical spine. No discrete or worrisome osseous lesions. Other neck: No other acute soft tissue abnormality within the neck. No mass or adenopathy. Upper chest: Scattered atelectatic changes noted within the visualized lungs. Few scattered superimposed tree-in-bud nodular densities within the peripheral right upper lobe could reflect changes of acute bronchiolitis and/or mucoid  impaction. No frank airspace consolidation. Review of the MIP images confirms the above findings CTA HEAD FINDINGS Anterior circulation: Petrous, cavernous, and  supraclinoid segments of both internal carotid arteries are widely patent without stenosis. A1 segments patent bilaterally. Normal anterior communicating artery. 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 Finding presumably chronic in nature given the chronic bilateral ACA territory infarcts seen on prior noncontrast head CT. Scant distal reconstitution, likely collateral. M1 segments patent bilaterally. Normal MCA bifurcations. Distal MCA branches well perfused and symmetric. Posterior circulation: Dominant left vertebral artery widely patent to the vertebrobasilar junction. Left PICA patent. Hypoplastic right vertebral artery grossly patent to the vertebrobasilar junction as well. Right PICA not visualized. Basilar patent to its distal aspect without stenosis. Superior cerebellar arteries patent bilaterally. Fetal type origin of the left PCA. Right PCA supplied via the basilar as well as a robust right posterior communicating artery. Scattered atheromatous irregularity within both PCAs without high-grade stenosis. Short-segment mild right P2 stenosis noted (series 10, image 19). Venous sinuses: Not well assessed due to timing of the contrast bolus. Anatomic variants: Fetal type origin of the left PCA. Hypoplastic right vertebral artery. 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 Review of the MIP images confirms the above findings CT Brain Perfusion Findings: ASPECTS: 10 CBF (<30%) Volume: 0mL Perfusion (Tmax>6.0s) volume: 18mL Mismatch Volume: 18mL Infarction Location:Negative CT perfusion for acute ischemia. Delayed perfusion seen within the right ACA distribution, in keeping with the chronic ACA occlusion. IMPRESSION: CTA HEAD AND NECK IMPRESSION: 1. Negative CTA for emergent large vessel occlusion. 2. Chronically occluded azygos ACA, in keeping with the  previously identified chronic bilateral ACA territory infarcts. 3. Scattered mild for age atheromatous change elsewhere about the major arterial vasculature of the head and neck. No other hemodynamically significant or correctable stenosis. 4. Few scattered tree-in-bud nodular densities within right upper lobe, nonspecific, but could reflect changes of acute bronchiolitis and/or mucoid impaction. No frank airspace consolidation. CT PERFUSION IMPRESSION: 1. Negative CT perfusion for acute ischemia. 2. 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 Critical Value/emergent results were called by telephone at the time of interpretation on 11/09/2020 at 9:45 pm to provider [facility] , who verbally acknowledged these results. Electronically Signed   By: Rise Mu M.D.   On: 11/09/2020 22:11   MR BRAIN WO CONTRAST  Result Date: 11/10/2020 CLINICAL DATA:  Stroke follow-up. EXAM: MRI HEAD WITHOUT CONTRAST TECHNIQUE: Multiplanar, multiecho pulse sequences of the brain and surrounding structures were obtained without intravenous contrast. COMPARISON:  Head CT November 09, 2020. FINDINGS: Incomplete study due to patient inability to lie still in the scanner for the duration the study. Chronic bilateral ACA territory infarct. T2 hyperintensity within the periventricular white matter, nonspecific, most likely related to chronic small vessel ischemia. Brain: No acute infarction, hemorrhage, hydrocephalus, extra-axial collection or mass lesion. Vascular: Normal flow voids at the skull base. Sinuses/Orbits: Opacification of the bilateral frontal sinuses. The orbits are grossly unremarkable. IMPRESSION: 1. Incomplete study . 2. No acute intracranial abnormality identified. 3. Bilateral frontal sinus disease. Electronically Signed   By: Baldemar Lenis M.D.   On: 11/10/2020 18:00   US RENAL  Result Date:  11/10/2020 CLINICAL DATA:  Rule out hydronephrosis EXAM: RENAL / URINARY TRACT ULTRASOUND COMPLETE COMPARISON:  None. FINDINGS: Right Kidney: Renal measurements: 8.7 x 5.1 x 4.7 cm = volume: 109 mL. Echogenicity within normal limits. No mass or hydronephrosis visualized. Left Kidney: Renal measurements: 10.0 x 4.7 x 4.6 cm = volume: 112 mL. Echogenicity within normal limits. No mass or hydronephrosis visualized. Bladder: Decompressed with Foley  catheter in place. Other: None. IMPRESSION: No acute findings.  No hydronephrosis. Electronically Signed   By: Charlett Nose M.D.   On: 11/10/2020 21:40   CT C-SPINE NO CHARGE  Result Date: 11/09/2020 CLINICAL DATA:  Left-sided weakness EXAM: CT CERVICAL SPINE WITHOUT CONTRAST TECHNIQUE: Multidetector CT imaging of the cervical spine was performed without intravenous contrast. Multiplanar CT image reconstructions were also generated. COMPARISON:  None. FINDINGS: Alignment: Alignment is anatomic. Skull base and vertebrae: No acute fracture. No primary bone lesion or focal pathologic process. Soft tissues and spinal canal: Please refer to separately reported CT angiography of the neck for description of vascular findings. No prevertebral fluid or swelling. No visible canal hematoma. Disc levels: Bridging anterior osteophytes are seen throughout the entire cervical spine. Mild disc osteophyte complex at C4-5. No significant central canal or neural foraminal encroachment. Upper chest: Airway is patent. Lung apices are clear. Other: Reconstructed images confirm the above findings. IMPRESSION: 1. No acute cervical spine fracture. 2. Diffuse cervical spondylosis. No significant neural foraminal or central canal stenosis. Electronically Signed   By: Sharlet Salina M.D.   On: 11/09/2020 22:02   CT Code Stroke Cerebral Perfusion with contrast  Result Date: 11/09/2020 CLINICAL DATA:  Initial evaluation for acute stroke, left-sided weakness. EXAM: CT ANGIOGRAPHY HEAD AND NECK CT  PERFUSION BRAIN TECHNIQUE: Multidetector CT imaging of the head and neck was performed using the standard protocol during bolus administration of intravenous contrast. Multiplanar CT image reconstructions and MIPs were obtained to evaluate the vascular anatomy. Carotid stenosis measurements (when applicable) are obtained utilizing NASCET criteria, using the distal internal carotid diameter as the denominator. Multiphase CT imaging of the brain was performed following IV bolus contrast injection. Subsequent parametric perfusion maps were calculated using RAPID software. CONTRAST:  100 cc of Omnipaque 350. COMPARISON:  Prior head CT from earlier the same day. FINDINGS: CTA NECK FINDINGS Aortic arch: Visualized aortic arch of normal caliber with normal branch pattern. No hemodynamically significant stenosis about the origin of the great vessels. Visualized subclavian arteries widely patent. Right carotid system: Right common and internal carotid arteries widely patent without stenosis, dissection or occlusion. Left carotid system: Left CCA patent from its origin to the bifurcation without stenosis. Mild eccentric soft plaque at the origin of the left ICA without significant stenosis. Left ICA patent distally without stenosis, dissection or occlusion. Vertebral arteries: Both vertebral arteries arise from the subclavian arteries. Dominant left vertebral artery with a diffusely hypoplastic right vertebral artery. Left vertebral artery widely patent within the neck without stenosis or other abnormality. Severe stenosis at the origin of the right vertebral artery. Hypoplastic right vertebral artery otherwise patent within the neck without stenosis or other abnormality. Skeleton: No visible acute osseous abnormality, although cervical spine better evaluated on concomitant CT of the cervical spine. No discrete or worrisome osseous lesions. Other neck: No other acute soft tissue abnormality within the neck. No mass or  adenopathy. Upper chest: Scattered atelectatic changes noted within the visualized lungs. Few scattered superimposed tree-in-bud nodular densities within the peripheral right upper lobe could reflect changes of acute bronchiolitis and/or mucoid impaction. No frank airspace consolidation. Review of the MIP images confirms the above findings CTA HEAD FINDINGS Anterior circulation: Petrous, cavernous, and supraclinoid segments of both internal carotid arteries are widely patent without stenosis. A1 segments patent bilaterally. Normal anterior communicating artery. 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 Finding presumably chronic in nature given the chronic bilateral ACA territory infarcts seen on prior noncontrast head CT.  Scant distal reconstitution, likely collateral. M1 segments patent bilaterally. Normal MCA bifurcations. Distal MCA branches well perfused and symmetric. Posterior circulation: Dominant left vertebral artery widely patent to the vertebrobasilar junction. Left PICA patent. Hypoplastic right vertebral artery grossly patent to the vertebrobasilar junction as well. Right PICA not visualized. Basilar patent to its distal aspect without stenosis. Superior cerebellar arteries patent bilaterally. Fetal type origin of the left PCA. Right PCA supplied via the basilar as well as a robust right posterior communicating artery. Scattered atheromatous irregularity within both PCAs without high-grade stenosis. Short-segment mild right P2 stenosis noted (series 10, image 19). Venous sinuses: Not well assessed due to timing of the contrast bolus. Anatomic variants: Fetal type origin of the left PCA. Hypoplastic right vertebral artery. 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 Review of the MIP images confirms the above findings CT Brain Perfusion Findings: ASPECTS: 10 CBF (<30%) Volume: 0mL Perfusion (Tmax>6.0s) volume: 18mL Mismatch Volume: 18mL Infarction Location:Negative CT perfusion for acute ischemia. Delayed  perfusion seen within the right ACA distribution, in keeping with the chronic ACA occlusion. IMPRESSION: CTA HEAD AND NECK IMPRESSION: 1. Negative CTA for emergent large vessel occlusion. 2. 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 3. Scattered mild for age atheromatous change elsewhere about the major arterial vasculature of the head and neck. No other hemodynamically significant or correctable stenosis. 4. Few scattered tree-in-bud nodular densities within right upper lobe, nonspecific, but could reflect changes of acute bronchiolitis and/or mucoid impaction. No frank airspace consolidation. CT PERFUSION IMPRESSION: 1. Negative CT perfusion for acute ischemia. 2. 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 Critical Value/emergent results were called by telephone at the time of interpretation on 11/09/2020 at 9:45 pm to provider [facility] , who verbally acknowledged these results. Electronically Signed   By: Rise Mu M.D.   On: 11/09/2020 22:11   DG CHEST PORT 1 VIEW  Result Date: 11/11/2020 CLINICAL DATA:  PICC line placement EXAM: PORTABLE CHEST 1 VIEW COMPARISON:  11/09/2020 FINDINGS: Left upper extremity PICC line is been placed with its tip within the superior vena cava. Minimal left basilar atelectasis. Lungs are otherwise clear. No pneumothorax or pleural effusion. Cardiac size within normal limits. Pulmonary vascularity is normal. No acute bone abnormality. IMPRESSION: Left upper extremity PICC line tip within the superior vena cava. Electronically Signed   By: Helyn Numbers MD   On: 11/11/2020 13:12   DG Chest Port 1 View  Result Date: 11/09/2020 CLINICAL DATA:  Fall EXAM: PORTABLE CHEST 1 VIEW COMPARISON:  None. FINDINGS: The heart size and mediastinal contours are within normal limits. Both lungs are clear. The  visualized skeletal structures are unremarkable. IMPRESSION: No active disease. Electronically Signed   By: Jonna Clark M.D.   On: 11/09/2020 22:38   DG Swallowing Func-Speech Pathology  Result Date: 11/11/2020 Objective Swallowing Evaluation: Type of Study: MBS-Modified Barium Swallow Study  Patient Details Name: Calen Geister MRN: [id number] Date of Birth: 08-Jan-1951 Today's Date: 11/11/2020 Time: SLP Start Time (ACUTE ONLY): 1437 -SLP Stop Time (ACUTE ONLY): 1455 SLP Time Calculation (min) (ACUTE ONLY): 18 min Past Medical History: Past Medical History: Diagnosis Date . Chronic systolic CHF (congestive heart failure) (HCC)   EF 35-40, diffuse HK, mild MR, moderate LAE, mild RAE, PASP 44, L pleural eff . Dilated cardiomyopathy (HCC)   likely related to tachycardia . Persistent atrial fibrillation [facility])  Past Surgical History: Past Surgical  History: Procedure Laterality Date . CARDIOVERSION N/A 08/20/2017  Procedure: CARDIOVERSION;  Surgeon: [name], MD;  Location: [facility] ENDOSCOPY;  Service: Cardiovascular;  Laterality: N/A; . INCISION AND DRAINAGE ABSCESS Left 03/22/2014  Procedure: INCISION AND DRAINAGE ABSCESS LEFT BUTTOCK ABSCESS;  Surgeon: [name], MD;  Location: [facility] OR;  Service: General;  Laterality: Left; HPI: Pt is a 70 y.o. male with medical history significant for atrial fibrillation on Eliquis-last dose unknown, dilated cardiomyopathy, chronic systolic CHF, CKD 3 who presented after being found on the floor at home by his friends who had last seen him one week prior. EMT noticed increased weakness on the left and he was brought in as a code stroke; this was ultimately cancelled by neurology. MRI was negative for acute changes. Pt found to have COVID-19. CXR 2/8 was negative.  No data recorded Assessment / Plan / Recommendation CHL IP CLINICAL IMPRESSIONS 11/11/2020 Clinical Impression Cervical osteophytes were noted, but did not significantly impact swallow function. Pt demonstrated decreased  bolus cohesion, a pharyngeal delay, and incomplete epiglottic inverversion with the initial bolus of thin liquids via cup which was administered by the SLP. Premature spillage was noted to the pyriform sinuses and aspiration (PAS 7) noted thereafter secondary to the pharyngeal delay and incomplete epiglottic inversion. Coughing was noted and effective in expelling some of the aspirated material. Subsequent swallows were significant for prolonged mastication, and mildly reduced lingual retraction, but his swallow was notably more timely with self-feeding and no subsequent instances of penetration or aspiration were noted even when challenged with large sips and consecutive swallows. Mild to moderate vallecular residue was demonstrated with solids and was reduced to a functional level with a liquid wash. A dysphagia 3 diet with thin liquids is recommended at this time. Pt's swallow function and associated aspiration risk are much improved with self-feeding and it is recommended that this be encouraged with supervision. SLP will follow to ensure diet tolerance. SLP Visit Diagnosis Dysphagia, unspecified (R13.10) Attention and concentration deficit following -- Frontal lobe and executive function deficit following -- Impact on safety and function Moderate aspiration risk;Mild aspiration risk   CHL IP TREATMENT RECOMMENDATION 11/11/2020 Treatment Recommendations Therapy as outlined in treatment plan below   Prognosis 11/11/2020 Prognosis for Safe Diet Advancement Good Barriers to Reach Goals Cognitive deficits Barriers/Prognosis Comment -- CHL IP DIET RECOMMENDATION 11/11/2020 SLP Diet Recommendations Dysphagia 3 (Mech soft) solids;Thin liquid Liquid Administration via Cup;Straw Medication Administration Whole meds with liquid Compensations Slow rate;Small sips/bites Postural Changes Remain semi-upright after after feeds/meals (Comment);Seated upright at 90 degrees   CHL IP OTHER RECOMMENDATIONS 11/11/2020 Recommended  Consults -- Oral Care Recommendations Oral care BID Other Recommendations --   CHL IP FOLLOW UP RECOMMENDATIONS 11/11/2020 Follow up Recommendations (No Data)   CHL IP FREQUENCY AND DURATION 11/11/2020 Speech Therapy Frequency (ACUTE ONLY) min 2x/week Treatment Duration 2 weeks      CHL IP ORAL PHASE 11/11/2020 Oral Phase Impaired Oral - Pudding Teaspoon -- Oral - Pudding Cup -- Oral - Honey Teaspoon -- Oral - Honey Cup -- Oral - Nectar Teaspoon -- Oral - Nectar Cup WFL Oral - Nectar Straw WFL Oral - Thin Teaspoon -- Oral - Thin Cup Decreased bolus cohesion;Premature spillage Oral - Thin Straw WFL Oral - Puree WFL Oral - Mech Soft WFL Oral - Regular WFL Oral - Multi-Consistency -- Oral - Pill WFL Oral Phase - Comment --  CHL IP PHARYNGEAL PHASE 11/11/2020 Pharyngeal Phase Impaired Pharyngeal- Pudding Teaspoon -- Pharyngeal -- Pharyngeal- Pudding Cup --  Pharyngeal -- Pharyngeal- Honey Teaspoon -- Pharyngeal -- Pharyngeal- Honey Cup -- Pharyngeal -- Pharyngeal- Nectar Teaspoon -- Pharyngeal -- Pharyngeal- Nectar Cup Reduced tongue base retraction Pharyngeal -- Pharyngeal- Nectar Straw WFL Pharyngeal -- Pharyngeal- Thin Teaspoon -- Pharyngeal -- Pharyngeal- Thin Cup Reduced tongue base retraction;Delayed swallow initiation-pyriform sinuses;Pharyngeal residue - valleculae;Pharyngeal residue - pyriform;Penetration/Aspiration during swallow;Penetration/Apiration after swallow;Moderate aspiration Pharyngeal Material enters airway, passes BELOW cords and not ejected out despite cough attempt by patient Pharyngeal- Thin Straw -- Pharyngeal -- Pharyngeal- Puree -- Pharyngeal -- Pharyngeal- Mechanical Soft -- Pharyngeal -- Pharyngeal- Regular -- Pharyngeal -- Pharyngeal- Multi-consistency -- Pharyngeal -- Pharyngeal- Pill -- Pharyngeal -- Pharyngeal Comment --  No flowsheet data found. Scheryl Marten 11/11/2020, 3:52 PM              DG HIP UNILAT WITH PELVIS 2-3 VIEWS RIGHT  Result Date: 11/10/2020 CLINICAL DATA:  Found  down, right hip tenderness EXAM: DG HIP (WITH OR WITHOUT PELVIS) 2-3V RIGHT COMPARISON:  None. FINDINGS: Frontal view of the pelvis including both hips as well as frontal and frogleg lateral views of the right hip are obtained. No fracture, subluxation, or dislocation. Joint spaces are well preserved. Soft tissues are normal. IMPRESSION: 1. No acute displaced fracture. Electronically Signed   By: Sharlet Salina M.D.   On: 11/10/2020 19:09   CT HEAD CODE STROKE WO CONTRAST  Result Date: 11/09/2020 CLINICAL DATA:  Code stroke. Initial evaluation for acute left-sided weakness, stroke suspected. EXAM: CT HEAD WITHOUT CONTRAST TECHNIQUE: Contiguous axial images were obtained from the base of the skull through the vertex without intravenous contrast. COMPARISON:  None available. FINDINGS: Brain: Age-related cerebral atrophy with chronic small vessel ischemic disease. Remote lacunar infarct present at the left caudate. Remote bilateral ACA territory infarcts noted. No acute intracranial hemorrhage. No acute large vessel territory infarct. No mass lesion, mass effect or midline shift. Mild ventricular prominence related to global parenchymal volume loss without hydrocephalus. No extra-axial fluid collection. Vascular: No hyperdense vessel. Skull: Scalp soft tissues demonstrate no acute finding. Small lipoma noted at the posterior scalp. Calvarium intact. Sinuses/Orbits: Globes and orbital soft tissues within normal limits. Chronic frontal sinusitis noted. Scattered mucosal thickening noted elsewhere within the sphenoid ethmoidal sinuses. No mastoid effusion. Other: None. ASPECTS [facility] Stroke Program Early CT Score) - Ganglionic level infarction (caudate, lentiform nuclei, internal capsule, insula, M1-M3 cortex): 7 - Supraganglionic infarction (M4-M6 cortex): 3 Total score (0-10 with 10 being normal): 10 IMPRESSION: 1. No acute intracranial infarct or other abnormality. 2. ASPECTS is 10. 3. Remote bilateral ACA  territory infarcts, with additional remote lacunar infarct at the left basal ganglia. 4. Underlying atrophy with chronic small vessel ischemic disease. These results were communicated to Dr. Wilford Corner at 9:31 pmon 2/8/2022by text page via the [facility] messaging system. Electronically Signed   By: Rise Mu M.D.   On: 11/09/2020 21:51   ECHOCARDIOGRAM LIMITED  Result Date: 11/10/2020    ECHOCARDIOGRAM LIMITED REPORT   Patient Name:   JAMICHEAL HEARD  Date of Exam: 11/10/2020 Medical Rec #:  [id number]  Height:       72.0 in Accession #:    [id number] Weight:       217.6 lb Date of Birth:  March 09, 1951 BSA:          2.208 m Patient Age:    69 years   BP:           138/94 mmHg Patient Gender: M          HR:  138 bpm. Exam Location:  Inpatient Procedure: Limited Echo, Cardiac Doppler and Color Doppler Indications:    Atrial fibrillation  History:        Patient has prior history of Echocardiogram examinations, most                 recent 07/19/2017. CHF; Arrythmias:Atrial Fibrillation. Dilated                 cardiomyopathy. CKD.  Sonographer:    Ross Ludwig RDCS (AE) Referring Phys: [id number] [facility]  Sonographer Comments: Suboptimal parasternal window. IMPRESSIONS  1. Left ventricular ejection fraction, by estimation, is 60 to 65%. The left ventricle has normal function. The left ventricle has no regional wall motion abnormalities. There is moderate left ventricular hypertrophy. Left ventricular diastolic parameters are indeterminate.  2. Right ventricular systolic function is normal. The right ventricular size is normal.  3. Left atrial size was mildly dilated.  4. The mitral valve is normal in structure. Trivial mitral valve regurgitation. No evidence of mitral stenosis.  5. The aortic valve was not well visualized. Aortic valve regurgitation is not visualized. No aortic stenosis is present.  6. The inferior vena cava is dilated in size with >50% respiratory variability, suggesting right atrial  pressure of 8 mmHg. FINDINGS  Left Ventricle: Left ventricular ejection fraction, by estimation, is 60 to 65%. The left ventricle has normal function. The left ventricle has no regional wall motion abnormalities. The left ventricular internal cavity size was normal in size. There is  moderate left ventricular hypertrophy. Left ventricular diastolic parameters are indeterminate. Right Ventricle: The right ventricular size is normal. No increase in right ventricular wall thickness. Right ventricular systolic function is normal. Left Atrium: Left atrial size was mildly dilated. Right Atrium: Right atrial size was normal in size. Pericardium: There is no evidence of pericardial effusion. Mitral Valve: The mitral valve is normal in structure. Trivial mitral valve regurgitation. No evidence of mitral valve stenosis. Tricuspid Valve: The tricuspid valve is normal in structure. Tricuspid valve regurgitation is mild . No evidence of tricuspid stenosis. Aortic Valve: The aortic valve was not well visualized. Aortic valve regurgitation is not visualized. No aortic stenosis is present. Pulmonic Valve: The pulmonic valve was normal in structure. Pulmonic valve regurgitation is not visualized. No evidence of pulmonic stenosis. Aorta: The aortic root is normal in size and structure. Venous: The inferior vena cava is dilated in size with greater than 50% respiratory variability, suggesting right atrial pressure of 8 mmHg. IAS/Shunts: No atrial level shunt detected by color flow Doppler. LEFT VENTRICLE PLAX 2D LVIDd:         4.80 cm LVIDs:         3.40 cm LV PW:         1.60 cm LV IVS:        1.50 cm LVOT diam:     2.10 cm LVOT Area:     3.46 cm  IVC IVC diam: 1.70 cm LEFT ATRIUM         Index LA diam:    3.90 cm 1.77 cm/m   AORTA Ao Root diam: 3.60 cm  SHUNTS Systemic Diam: 2.10 cm Charlton Haws MD Electronically signed by Charlton Haws MD Signature Date/Time: 11/10/2020/3:42:53 PM    Final    [place] EKG SITE RITE  Result Date:  11/11/2020 If Site Rite image not attached, placement could not be confirmed due to current cardiac rhythm.

## 2020-11-13 DIAGNOSIS — N179 Acute kidney failure, unspecified: Secondary | ICD-10-CM | POA: Diagnosis not present

## 2020-11-13 DIAGNOSIS — R531 Weakness: Secondary | ICD-10-CM | POA: Diagnosis not present

## 2020-11-13 DIAGNOSIS — U071 COVID-19: Secondary | ICD-10-CM | POA: Diagnosis not present

## 2020-11-13 DIAGNOSIS — I4891 Unspecified atrial fibrillation: Secondary | ICD-10-CM | POA: Diagnosis not present

## 2020-11-13 DIAGNOSIS — G9341 Metabolic encephalopathy: Secondary | ICD-10-CM | POA: Diagnosis not present

## 2020-11-13 LAB — COMPREHENSIVE METABOLIC PANEL
ALT: 41 U/L (ref 0–44)
AST: 32 U/L (ref 15–41)
Albumin: 2.1 g/dL — ABNORMAL LOW (ref 3.5–5.0)
Alkaline Phosphatase: 64 U/L (ref 38–126)
Anion gap: 6 (ref 5–15)
BUN: 49 mg/dL — ABNORMAL HIGH (ref 8–23)
CO2: 19 mmol/L — ABNORMAL LOW (ref 22–32)
Calcium: 8.3 mg/dL — ABNORMAL LOW (ref 8.9–10.3)
Chloride: 125 mmol/L — ABNORMAL HIGH (ref 98–111)
Creatinine, Ser: 1.68 mg/dL — ABNORMAL HIGH (ref 0.61–1.24)
GFR, Estimated: 44 mL/min — ABNORMAL LOW (ref >60)
Glucose, Bld: 143 mg/dL — ABNORMAL HIGH (ref 70–99)
Potassium: 3.7 mmol/L (ref 3.5–5.1)
Sodium: 150 mmol/L — ABNORMAL HIGH (ref 135–145)
Total Bilirubin: 0.7 mg/dL (ref 0.3–1.2)
Total Protein: 4.8 g/dL — ABNORMAL LOW (ref 6.5–8.1)

## 2020-11-13 LAB — CBC WITH DIFFERENTIAL/PLATELET
Abs Immature Granulocytes: 0.07 10*3/uL (ref 0.00–0.07)
Basophils Absolute: 0 10*3/uL (ref 0.0–0.1)
Basophils Relative: 0 %
Eosinophils Absolute: 0.1 10*3/uL (ref 0.0–0.5)
Eosinophils Relative: 1 %
HCT: 35.9 % — ABNORMAL LOW (ref 39.0–52.0)
Hemoglobin: 11.9 g/dL — ABNORMAL LOW (ref 13.0–17.0)
Immature Granulocytes: 1 %
Lymphocytes Relative: 11 %
Lymphs Abs: 0.8 10*3/uL (ref 0.7–4.0)
MCH: 28.7 pg (ref 26.0–34.0)
MCHC: 33.1 g/dL (ref 30.0–36.0)
MCV: 86.5 fL (ref 80.0–100.0)
Monocytes Absolute: 0.4 10*3/uL (ref 0.1–1.0)
Monocytes Relative: 6 %
Neutro Abs: 5.5 10*3/uL (ref 1.7–7.7)
Neutrophils Relative %: 81 %
Platelets: 191 10*3/uL (ref 150–400)
RBC: 4.15 MIL/uL — ABNORMAL LOW (ref 4.22–5.81)
RDW: 15.9 % — ABNORMAL HIGH (ref 11.5–15.5)
WBC: 6.8 10*3/uL (ref 4.0–10.5)
nRBC: 0 % (ref 0.0–0.2)

## 2020-11-13 LAB — C-REACTIVE PROTEIN: CRP: 3.5 mg/dL — ABNORMAL HIGH (ref <1.0)

## 2020-11-13 MED ORDER — LORAZEPAM 2 MG/ML IJ SOLN: 1.0000 mg | Freq: Once | INTRAMUSCULAR | Status: DC | PRN

## 2020-11-13 MED ORDER — HALOPERIDOL LACTATE 5 MG/ML IJ SOLN: 2.0000 mg | Freq: Four times a day (QID) | INTRAMUSCULAR | Status: DC | PRN

## 2020-11-13 NOTE — Progress Notes (Addendum)
PROGRESS NOTE                                                                             PROGRESS NOTE                                                                                                                                                                                                             Patient Demographics:    Jeffery Christian, is a 70 y.o. male, DOB - 1951-01-02, ZOX:[id number]  Outpatient Primary MD for the patient is Hoy RegisterNewlin, Enobong, MD    LOS - 4  Admit date - 11/09/2020    Chief Complaint  Patient presents with  . Code Stroke       Brief Narrative     HPI: Jeffery GreenhouseGary Christian is a 70 y.o. male with medical history significant for atrial fibrillation on Eliquis-last dose unknown, dilated cardiomyopathy, chronic systolic CHF, CKD 3 who presents by EMS after being found on the floor at home by his friends.  He was last seen about 1 week ago by his friends.  His friends not heard from him in the last week so they went to his house to check on him and upon getting no response at his door, knocked the door down and found him down on the ground between a dresser in the bed.  He was not talking at the time. He was weak all over but the EMTs noted him to be weaker on the left in comparison to the right and brought to ER as a code stroke.  He was unable to provide any history. He was able to follow simple commands He had a strong smell of urine and EMTs reported urine and stool around him when they picked him up from his house.  ED Course: He was found to have elevated heart rate in the 1 60-1 80 range.  EKG showed atrial fibrillation with RVR.  He was started on Cardizem infusion in the emergency room but continues to have elevated heart rate.  CT the  head was negative for acute stroke.  Neurology was consulted and has evaluated patient.  Patient was found to be hypernatremic on labs with sodium of 168. Troponin was elevated at 56    Subjective:     Jeffery Christian is more awake and appropriate over last few days, but he is confused and restless overnight where he required one dose of Haldol, he himself denies any complaints today, but he is not very reliable historian .   Assessment  & Plan :    Principal Problem:   Atrial fibrillation with RVR (HCC) Active Problems:   Acute renal failure superimposed on stage 3 chronic kidney disease (HCC)   Acute metabolic encephalopathy   Hypernatremia   Elevated troponin   COVID-19 virus infection  Hypernatremia -Severe, due to hypovolemia, in the setting of volume depletion and dehydration. -Improving gradually, avoid rapid correction . -Sodium is 151, he was encouraged to increase free water intake .  COVID-19 infection -Patient tells me he is vaccinated earlier last year, but not boosted -No evidence of pneumonia on x-ray, no  oxygen requirement -Refill with Remdesivir, no indication for steroids -Will need isolation for 10 days total  Acute metabolic encephalopathy: -Significantly improved -MRI incomplete study, no evidence of acute CVA or acute findings. 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 -Renal ultrasound with no hydronephrosis - on p.o. bicarb. Given low bicarb levels.  A fib with RVR -Usually on  amiodarone drip, but with poor control, transition to Cardizem drip, this has significantly improved, Cardizem drip discontinued today, continue with p.o. metoprolol . -On Eliquis at home, he is on Lovenox during hospital stay.    Hypertension:  -Blood pressure acceptable on metoprolol  Hyperkalemia:  -Improving  UTI -Continue with Rocephin x3 days, urine culture unhelpful as growing multiple species.  Right gluteal unstageable pressure ulcer -This is present on admission, and right buttock area, as he was on the floor for unknown period of time, wound care has been consulted, continue with hydrotherapy   Pressure Injury Buttocks Right Unstageable - Full thickness tissue loss in which the base of the injury is covered by slough (yellow, tan, gray, green or brown) and/or eschar (tan, brown or black) in the wound bed. (Active)     Location: Buttocks  Location Orientation: Right  Staging: Unstageable - Full thickness tissue loss in which the base of the injury is covered by slough (yellow, tan, gray, green or brown) and/or eschar (tan, brown or black) in the wound bed.  Wound Description (Comments):   Present on Admission: Yes   Wounds at glans penis, around Foley insertion site -Discussed with urology Dr. Liliane Shi, he recommends local wound care including Neosporin and washing with soap and water, and outpatient follow-up with him once stable. -Foley catheter has been discontinued, but if patient develops any signs of scarring or retention  will need Foley catheter back in.  Right arm swelling due to infiltration at IV site -No evidence of infection, continue with right arm elevation, IV access has been moved to the left arm.   SpO2: 97 % O2 Flow Rate (L/min): 3 L/min  Recent Labs  Lab 11/09/20 2112 11/09/20 2113 11/09/20 2120 11/09/20 2242 11/10/20 0051 11/10/20 0804 11/10/20 1508 11/11/20 0035 11/12/20 0345 11/13/20 0304  WBC 10.5  --   --   --   --  9.4  --  9.1 8.6 6.8   PLT 397  --   --   --   --  318  --  252 208 191  CRP  --   --   --   --   --  2.7*  --  2.8* 2.0* 3.5*  AST 32  --   --   --   --  21  --  24 27 32  ALT 28  --   --   --   --  25  --  24 26 41  ALKPHOS 72  --   --   --   --  62  --  56 56 64  BILITOT 2.5*  --   --   --   --  1.4*  --  1.0 0.6 0.7  ALBUMIN 2.9*  --   --   --   --  2.5* 2.6* 2.3* 2.2* 2.1*  INR  --   --   --  1.4*  --   --   --   --   --   --   LATICACIDVEN  --   --  2.4*  --  3.2*  --   --   --   --   --   SARSCOV2NAA  --  POSITIVE*  --   --   --   --   --   --   --   --        ABG  No results found for: PHART, PCO2ART, PO2ART, HCO3, TCO2, ACIDBASEDEF, O2SAT     Condition - Extremely Guarded  Family Communication  :  D/W daughter by phone daily, left voicemail 2/12  Code Status :  full  Consults  :  Renal, Neurology  Disposition Plan  :    Status is: Inpatient  Remains inpatient appropriate because:Hemodynamically unstable, Unsafe d/c plan and IV treatments appropriate due to intensity of illness or inability to take PO   Dispo: The patient is from: Home              Anticipated d/c is to: SNF              Anticipated d/c date is: > 3 days              Patient currently is not medically stable to d/c.   Difficult to place patient Yes      DVT Prophylaxis  :   Heparin GTT>> lovenox  Lab Results  Component Value Date   PLT 191 11/13/2020    Diet :  Diet Order            DIET DYS 3 Room service appropriate? Yes with Assist; Fluid consistency: Thin  Diet effective now                  Inpatient Medications  Scheduled Meds: . Chlorhexidine Gluconate Cloth  6 each Topical Daily  . collagenase   Topical Daily  . enoxaparin (LOVENOX)  injection  90 mg Subcutaneous Q12H  . feeding supplement  237 mL Oral BID BM  . metoprolol tartrate  25 mg Oral BID  . neomycin-bacitracin-polymyxin   Topical BID  . pantoprazole (PROTONIX) IV  40 mg Intravenous Q24H  . sodium chloride flush  10-40 mL  Intracatheter Q12H   Continuous Infusions: . cefTRIAXone (ROCEPHIN)  IV Stopped (11/13/20 0910)  . diltiazem (CARDIZEM) infusion Stopped (11/13/20 1004)   PRN Meds:.acetaminophen, albuterol, haloperidol lactate, LORazepam, sodium chloride flush  Antibiotics  :    Anti-infectives (From admission, onward)   Start     Dose/Rate Route Frequency Ordered Stop   11/11/20 1000  remdesivir 100 mg in sodium chloride 0.9 % 100 mL IVPB  Status:  Discontinued       "Followed by" Linked Group Details   100 mg 200 mL/hr over 30 Minutes Intravenous Daily 11/10/20 0101 11/10/20 0103   11/11/20 0800  cefTRIAXone (ROCEPHIN) 1 g in sodium chloride 0.9 % 100 mL IVPB        1 g 200 mL/hr over 30 Minutes Intravenous Every 24 hours 11/11/20 0732     11/10/20 1600  remdesivir 100 mg in sodium chloride 0.9 % 100 mL IVPB        100 mg 200 mL/hr over 30 Minutes Intravenous Daily 11/09/20 2346 11/13/20 0910   11/10/20 0000  remdesivir 200 mg in sodium chloride 0.9% 250 mL IVPB        200 mg 580 mL/hr over 30 Minutes Intravenous Once 11/09/20 2346 11/10/20 0441   11/09/20 2345  remdesivir 200 mg in sodium chloride 0.9% 250 mL IVPB  Status:  Discontinued       "Followed by" Linked Group Details   200 mg 580 mL/hr over 30 Minutes Intravenous Once 11/10/20 0101 11/10/20 0103       Marishka Rentfrow M.D on 11/13/2020 at 11:34 AM  To page go to [email or web address]  [facility] -  Office  [phone]      Objective:   Vitals:   11/12/20 1952 11/12/20 2332 11/13/20 0354 11/13/20 0744  BP: 122/78 113/81 120/75 121/83  Pulse: 91 71 78 83  Resp: 18 20 18 16   Temp: 98.7 F (37.1 C) 97.9 F (36.6 C) 97.7 F (36.5 C) 98.3 F (36.8 C)  TempSrc: Oral Oral Oral Axillary  SpO2: 100% 98% 100% 97%  Weight:      Height:        Wt Readings from Last 3 Encounters:  11/09/20 98.7 kg  09/08/20 98.7 kg  06/08/20 93.9 kg     Intake/Output Summary (Last 24 hours) at 11/13/2020 1134 Last data filed at  11/13/2020 1004 Gross per 24 hour  Intake 445.71 ml  Output 350 ml  Net 95.71 ml     Physical Exam  Awake Alert, Oriented X 2, but remains significantly confused with impaired cognition and insight, but overall gradually improving Symmetrical Chest wall movement, Good air movement bilaterally, CTAB RRR,No Gallops,Rubs or new Murmurs, No Parasternal Heave +ve B.Sounds, Abd Soft, No tenderness, No rebound - guarding or rigidity. No Cyanosis, or edema, right lower extremity weakness, right knee bruising, right gluteal unstageable pressure ulcer, wound at glans penis has improved..     Data Review:    CBC Recent Labs  Lab 11/09/20 2112 11/10/20 0804 11/11/20 0035 11/12/20 0345 11/13/20 0304  WBC 10.5 9.4 9.1 8.6 6.8  HGB 17.0 14.9 12.8* 11.7* 11.9*  HCT 56.9* 50.7 42.8 36.7* 35.9*  PLT 397 318 252 208 191  MCV 91.8 92.5 91.6 89.5 86.5  MCH 27.4 27.2 27.4 28.5 28.7  MCHC 29.9* 29.4* 29.9* 31.9 33.1  RDW 17.8* 16.8* 16.6* 16.7* 15.9*  LYMPHSABS 1.5 0.6* 0.5* 0.7 0.8  MONOABS 0.8 0.5 0.4 0.6 0.4  EOSABS 0.0 0.0 0.0 0.0 0.1  BASOSABS 0.0 0.0 0.0 0.0 0.0    Recent Labs  Lab 11/09/20 2112 11/09/20 2120 11/09/20 2242 11/10/20 0051 11/10/20 0804 11/10/20 1508 11/10/20 2000 11/11/20 0035 11/11/20 1500 11/11/20 2202 11/12/20 0345 11/12/20 1500 11/13/20 0304  NA 168*  --   --   --  166* 167*   < > 163* 159* 154* 150* 151* 150*  K 5.4*  --   --   --  4.6 4.1   < > 3.8 3.5 5.6* 3.5 3.7 3.7  CL 128*  --   --   --  >130* >130*   < > >130* >130* 129* 124* 122* 125*  CO2 21*  --   --   --  20* 20*   < > 17* 17* 14* 17* 18* 19*  GLUCOSE 157*  --   --   --  184* 152*   < > 212* 183* 160* 176* 133* 143*  BUN 101*  --   --   --  89* 85*   < > 86* 74* 71* 62* 53* 49*  CREATININE 3.10*  --   --   --  2.65* 2.58*   < > 2.46* 2.26* 2.45* 2.20* 1.90* 1.68*  CALCIUM 9.7  --   --   --  8.8* 8.5*   < > 8.6* 8.4* 8.0* 8.1* 8.2* 8.3*  AST 32  --   --   --  21  --   --  24  --   --  27   --  32  ALT 28  --   --   --  25  --   --  24  --   --  26  --  41  ALKPHOS 72  --   --   --  62  --   --  56  --   --  56  --  64  BILITOT 2.5*  --   --   --  1.4*  --   --  1.0  --   --  0.6  --  0.7  ALBUMIN 2.9*  --   --   --  2.5* 2.6*  --  2.3*  --   --  2.2*  --  2.1*  CRP  --   --   --   --  2.7*  --   --  2.8*  --   --  2.0*  --  3.5*  LATICACIDVEN  --  2.4*  --  3.2*  --   --   --   --   --   --   --   --   --   INR  --   --  1.4*  --   --   --   --   --   --   --   --   --   --    < > = values in this interval not displayed.    ------------------------------------------------------------------------------------------------------------------ No results for input(s): CHOL, HDL, LDLCALC, TRIG, CHOLHDL, LDLDIRECT in the last 72 hours.  Lab Results  Component Value Date   HGBA1C 5.3 06/08/2020   ------------------------------------------------------------------------------------------------------------------ No results for input(s): TSH, T4TOTAL, T3FREE, THYROIDAB in the last 72  hours.  Invalid input(s): FREET3  Cardiac Enzymes No results for input(s): CKMB, TROPONINI, MYOGLOBIN in the last 168 hours.  Invalid input(s): CK ------------------------------------------------------------------------------------------------------------------    Component Value Date/Time   BNP 1,922.3 (H) 08/04/2017 2132    Micro Results Recent Results (from the past 240 hour(s))  Resp Panel by RT-PCR (Flu A&B, Covid) Nasopharyngeal Swab     Status: Abnormal   Collection Time: 11/09/20  9:13 PM   Specimen: Nasopharyngeal Swab; Nasopharyngeal(NP) swabs in vial transport medium  Result Value Ref Range Status   SARS Coronavirus 2 by RT PCR POSITIVE (A) NEGATIVE Final    Comment: RESULT CALLED TO, READ BACK BY AND VERIFIED WITH: Minus Breeding RN 11/09/20 2227 JDW (NOTE) SARS-CoV-2 target nucleic acids are DETECTED.  The SARS-CoV-2 RNA is generally detectable in upper respiratory specimens during the  acute phase of infection. Positive results are indicative of the presence of the identified virus, but do not rule out bacterial infection or co-infection with other pathogens not detected by the test. Clinical correlation with patient history and other diagnostic information is necessary to determine patient infection status. The expected result is Negative.  Fact Sheet for Patients: BloggerCourse.com  Fact Sheet for Healthcare Providers: SeriousBroker.it  This test is not yet approved or cleared by the [place] FDA and  has been authorized for detection and/or diagnosis of SARS-CoV-2 by FDA under an Emergency Use Authorization (EUA).  This EUA will remain in effect (meaning this test can be used)  for the duration of  the COVID-19 declaration under Section 564(b)(1) of the Act, 21 U.S.C. section 360bbb-3(b)(1), unless the authorization is terminated or revoked sooner.     Influenza A by PCR NEGATIVE NEGATIVE Final   Influenza B by PCR NEGATIVE NEGATIVE Final    Comment: (NOTE) The Xpert Xpress SARS-CoV-2/FLU/RSV plus assay is intended as an aid in the diagnosis of influenza from Nasopharyngeal swab specimens and should not be used as a sole basis for treatment. Nasal washings and aspirates are unacceptable for Xpert Xpress SARS-CoV-2/FLU/RSV testing.  Fact Sheet for Patients: BloggerCourse.com  Fact Sheet for Healthcare Providers: SeriousBroker.it  This test is not yet approved or cleared by the [place] FDA and has been authorized for detection and/or diagnosis of SARS-CoV-2 by FDA under an Emergency Use Authorization (EUA). This EUA will remain in effect (meaning this test can be used) for the duration of the COVID-19 declaration under Section 564(b)(1) of the Act, 21 U.S.C. section 360bbb-3(b)(1), unless the authorization is terminated or revoked.  Performed at  [facility], [address], [place], [place] [address]   Culture, Urine     Status: Abnormal   Collection Time: 11/11/20  7:34 AM   Specimen: Urine, Random  Result Value Ref Range Status   Specimen Description URINE, RANDOM  Final   Special Requests   Final    NONE Performed at [facility], [address] [place], [place] [address]    Culture MULTIPLE SPECIES PRESENT, SUGGEST RECOLLECTION (A)  Final   Report Status 11/12/2020 FINAL  Final    Radiology Reports EEG  Result Date: 11/12/2020 Jeffery Quest, MD     11/12/2020 10:37 AM Patient Name: Jeffery Christian MRN: [id number] Epilepsy Attending: Charlsie Christian Referring Physician/Provider: Dr Caryl Pina Date: 11/12/2020 Duration: 23.19 mins Patient history: 70 yo M with ams. EEG to evaluate for seizure Level of alertness: Awake, drowsy, sleep, comatose, lethargic AEDs during EEG study: None Technical aspects: This EEG study was done  with scalp electrodes positioned according to the 10-20 International system of electrode placement. Electrical activity was acquired at a sampling rate of 500Hz  and reviewed with a high frequency filter of 70Hz  and a low frequency filter of 1Hz . EEG data were recorded continuously and digitally stored. Description: The posterior dominant rhythm consists of 8-9 Hz activity of moderate voltage (25-35 uV) seen predominantly in posterior head regions, symmetric and reactive to eye opening and eye closing. Hyperventilation and photic stimulation were not performed.   IMPRESSION: This study is within normal limits. No seizures or epileptiform discharges were seen throughout the recording.   CT Code Stroke CTA Head W/WO contrast  Result Date: 11/09/2020 CLINICAL DATA:  Initial evaluation for acute stroke, left-sided weakness. EXAM: CT ANGIOGRAPHY HEAD AND NECK CT PERFUSION BRAIN TECHNIQUE: Multidetector CT imaging of the head and neck was performed using the standard protocol during bolus  administration of intravenous contrast. Multiplanar CT image reconstructions and MIPs were obtained to evaluate the vascular anatomy. Carotid stenosis measurements (when applicable) are obtained utilizing NASCET criteria, using the distal internal carotid diameter as the denominator. Multiphase CT imaging of the brain was performed following IV bolus contrast injection. Subsequent parametric perfusion maps were calculated using RAPID software. CONTRAST:  100 cc of Omnipaque 350. COMPARISON:  Prior head CT from earlier the same day. FINDINGS: CTA NECK FINDINGS Aortic arch: Visualized aortic arch of normal caliber with normal branch pattern. No hemodynamically significant stenosis about the origin of the great vessels. Visualized subclavian arteries widely patent. Right carotid system: Right common and internal carotid arteries widely patent without stenosis, dissection or occlusion. Left carotid system: Left CCA patent from its origin to the bifurcation without stenosis. Mild eccentric soft plaque at the origin of the left ICA without significant stenosis. Left ICA patent distally without stenosis, dissection or occlusion. Vertebral arteries: Both vertebral arteries arise from the subclavian arteries. Dominant left vertebral artery with a diffusely hypoplastic right vertebral artery. Left vertebral artery widely patent within the neck without stenosis or other abnormality. Severe stenosis at the origin of the right vertebral artery. Hypoplastic right vertebral artery otherwise patent within the neck without stenosis or other abnormality. Skeleton: No visible acute osseous abnormality, although cervical spine better evaluated on concomitant CT of the cervical spine. No discrete or worrisome osseous lesions. Other neck: No other acute soft tissue abnormality within the neck. No mass or adenopathy. Upper chest: Scattered atelectatic changes noted within the visualized lungs. Few scattered superimposed tree-in-bud  nodular densities within the peripheral right upper lobe could reflect changes of acute bronchiolitis and/or mucoid impaction. No frank airspace consolidation. Review of the MIP images confirms the above findings CTA HEAD FINDINGS Anterior circulation: Petrous, cavernous, and supraclinoid segments of both internal carotid arteries are widely patent without stenosis. A1 segments patent bilaterally. Normal anterior communicating artery. 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 Finding presumably chronic in nature given the chronic bilateral ACA territory infarcts seen on prior noncontrast head CT. Scant distal reconstitution, likely collateral. M1 segments patent bilaterally. Normal MCA bifurcations. Distal MCA branches well perfused and symmetric. Posterior circulation: Dominant left vertebral artery widely patent to the vertebrobasilar junction. Left PICA patent. Hypoplastic right vertebral artery grossly patent to the vertebrobasilar junction as well. Right PICA not visualized. Basilar patent to its distal aspect without stenosis. Superior cerebellar arteries patent bilaterally. Fetal type origin of the left PCA. Right PCA supplied via the basilar as well as a robust right posterior communicating artery. Scattered atheromatous irregularity  within both PCAs without high-grade stenosis. Short-segment mild right P2 stenosis noted (series 10, image 19). Venous sinuses: Not well assessed due to timing of the contrast bolus. Anatomic variants: Fetal type origin of the left PCA. Hypoplastic right vertebral artery. 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 Review of the MIP images confirms the above findings CT Brain Perfusion Findings: ASPECTS: 10 CBF (<30%) Volume: 0mL Perfusion (Tmax>6.0s) volume: 18mL Mismatch Volume: 18mL Infarction Location:Negative CT perfusion for acute ischemia. Delayed perfusion seen within the right ACA distribution, in keeping with the chronic ACA occlusion. IMPRESSION: CTA HEAD AND NECK IMPRESSION:  1. Negative CTA for emergent large vessel occlusion. 2. 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 3. Scattered mild for age atheromatous change elsewhere about the major arterial vasculature of the head and neck. No other hemodynamically significant or correctable stenosis. 4. Few scattered tree-in-bud nodular densities within right upper lobe, nonspecific, but could reflect changes of acute bronchiolitis and/or mucoid impaction. No frank airspace consolidation. CT PERFUSION IMPRESSION: 1. Negative CT perfusion for acute ischemia. 2. 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 Critical Value/emergent results were called by telephone at the time of interpretation on 11/09/2020 at 9:45 pm to provider [facility] , who verbally acknowledged these results. Electronically Signed   By: Rise Mu M.D.   On: 11/09/2020 22:11   DG Pelvis 1-2 Views  Result Date: 11/09/2020 CLINICAL DATA:  Fall EXAM: PELVIS - 1-2 VIEW COMPARISON:  CT 03/22/2014 FINDINGS: Bones of the pelvis appear intact and congruent. Proximal femora intact and normally located within the acetabula. Moderate degenerative changes noted throughout the lower lumbar spine, bilateral SI joints, and bilateral hips. Some mild left hip soft tissue swelling is present. Finding on a background of diffuse mild body wall edema. Some hyperdense likely excreted contrast media seen within the urinary bladder. Slightly lobular, crenulated bladder contours and possible diverticular outpouchings, can be seen in the setting chronic outlet obstruction particularly in this patient with a history of prostatomegaly seen on comparison pelvic CT. No other acute or suspicious soft tissue abnormalities. IMPRESSION: 1. Mild left hip soft tissue swelling without acute fracture or traumatic malalignment. 2.  Moderate degenerative changes throughout the spine, bilateral SI joints and hips. 3. Bladder opacified by excreted contrast media. Slightly lobular, crenulated bladder contours and possible diverticular outpouchings, can be seen in the setting of chronic outlet obstruction particularly in this patient with a history of prostatomegaly. Electronically Signed   By: Kreg Shropshire M.D.   On: 11/09/2020 22:32   DG Knee 1-2 Views Right  Result Date: 11/10/2020 CLINICAL DATA:  Found down, tender to palpation EXAM: RIGHT KNEE - 1-2 VIEW COMPARISON:  None. FINDINGS: Frontal and lateral views of the right knee demonstrate 3 compartmental osteoarthritis most severe in the medial compartment. No acute fracture, subluxation, or dislocation. No joint effusion. IMPRESSION: 1. Three compartmental osteoarthritis.  No acute fracture. Electronically Signed   By: Sharlet Salina M.D.   On: 11/10/2020 19:12   CT Code Stroke CTA Neck W/WO contrast  Result Date: 11/09/2020 CLINICAL DATA:  Initial evaluation for acute stroke, left-sided weakness. EXAM: CT ANGIOGRAPHY HEAD AND NECK CT PERFUSION BRAIN TECHNIQUE: Multidetector CT imaging of the head and neck was performed using the standard protocol during bolus administration of intravenous contrast. Multiplanar CT image reconstructions and MIPs were obtained to evaluate the vascular anatomy. Carotid stenosis measurements (when applicable) are obtained utilizing  NASCET criteria, using the distal internal carotid diameter as the denominator. Multiphase CT imaging of the brain was performed following IV bolus contrast injection. Subsequent parametric perfusion maps were calculated using RAPID software. CONTRAST:  100 cc of Omnipaque 350. COMPARISON:  Prior head CT from earlier the same day. FINDINGS: CTA NECK FINDINGS Aortic arch: Visualized aortic arch of normal caliber with normal branch pattern. No hemodynamically significant stenosis about the origin of the great vessels. Visualized  subclavian arteries widely patent. Right carotid system: Right common and internal carotid arteries widely patent without stenosis, dissection or occlusion. Left carotid system: Left CCA patent from its origin to the bifurcation without stenosis. Mild eccentric soft plaque at the origin of the left ICA without significant stenosis. Left ICA patent distally without stenosis, dissection or occlusion. Vertebral arteries: Both vertebral arteries arise from the subclavian arteries. Dominant left vertebral artery with a diffusely hypoplastic right vertebral artery. Left vertebral artery widely patent within the neck without stenosis or other abnormality. Severe stenosis at the origin of the right vertebral artery. Hypoplastic right vertebral artery otherwise patent within the neck without stenosis or other abnormality. Skeleton: No visible acute osseous abnormality, although cervical spine better evaluated on concomitant CT of the cervical spine. No discrete or worrisome osseous lesions. Other neck: No other acute soft tissue abnormality within the neck. No mass or adenopathy. Upper chest: Scattered atelectatic changes noted within the visualized lungs. Few scattered superimposed tree-in-bud nodular densities within the peripheral right upper lobe could reflect changes of acute bronchiolitis and/or mucoid impaction. No frank airspace consolidation. Review of the MIP images confirms the above findings CTA HEAD FINDINGS Anterior circulation: Petrous, cavernous, and supraclinoid segments of both internal carotid arteries are widely patent without stenosis. A1 segments patent bilaterally. Normal anterior communicating artery. 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 Finding presumably chronic in nature given the chronic bilateral ACA territory infarcts seen on prior noncontrast head CT. Scant distal reconstitution, likely collateral. M1 segments patent bilaterally. Normal MCA bifurcations. Distal MCA branches  well perfused and symmetric. Posterior circulation: Dominant left vertebral artery widely patent to the vertebrobasilar junction. Left PICA patent. Hypoplastic right vertebral artery grossly patent to the vertebrobasilar junction as well. Right PICA not visualized. Basilar patent to its distal aspect without stenosis. Superior cerebellar arteries patent bilaterally. Fetal type origin of the left PCA. Right PCA supplied via the basilar as well as a robust right posterior communicating artery. Scattered atheromatous irregularity within both PCAs without high-grade stenosis. Short-segment mild right P2 stenosis noted (series 10, image 19). Venous sinuses: Not well assessed due to timing of the contrast bolus. Anatomic variants: Fetal type origin of the left PCA. Hypoplastic right vertebral artery. 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 Review of the MIP images confirms the above findings CT Brain Perfusion Findings: ASPECTS: 10 CBF (<30%) Volume: 53Ruel Favorson (Tmax>6.0s) volume: 67mT862 Elmwoo114mR44mRuel FavorsFavorsorsual merc098Gei28mR5mRuel Favorsose RoaVisual 42mR68mRuel Favorsors2mtT8 Pine AveVisual 32meT85 [place] StVisual merc04m8T279 [place] DrVisual m9mrT720 [address] AveVisual merc098[facility] 93menter14ertion Location:Negative CT perfusion for acute ischemia. Delayed perfusion seen within the right ACA distribution, in keeping with the chronic ACA occlusion. IMPRESSION: CTA HEAD AND NECK IMPRESSION: 1. Negative CTA for emergent large vessel occlusion. 2. 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 3. Scattered mild for age atheromatous change elsewhere about the major arterial vasculature of the head and neck. No other hemodynamically significant or correctable stenosis. 4. Few scattered tree-in-bud nodular densities within right upper lobe, nonspecific, but could reflect changes of acute bronchiolitis and/or mucoid impaction. No frank airspace consolidation. CT PERFUSION IMPRESSION: 1. Negative CT perfusion for  acute ischemia. 2. Delayed perfusion within the right ACA distribution, in keeping with the chronic azygos ACA occlusion and chronic ACA territory  infarcts. Critical Value/emergent results were called by telephone at the time of interpretation on 11/09/2020 at 9:45 pm to provider [facility] , who verbally acknowledged these results. Electronically Signed   By: Rise Mu M.D.   On: 11/09/2020 22:11   MR BRAIN WO CONTRAST  Result Date: 11/10/2020 CLINICAL DATA:  Stroke follow-up. EXAM: MRI HEAD WITHOUT CONTRAST TECHNIQUE: Multiplanar, multiecho pulse sequences of the brain and surrounding structures were obtained without intravenous contrast. COMPARISON:  Head CT November 09, 2020. FINDINGS: Incomplete study due to patient inability to lie still in the scanner for the duration the study. Chronic bilateral ACA territory infarct. T2 hyperintensity within the periventricular white matter, nonspecific, most likely related to chronic small vessel ischemia. Brain: No acute infarction, hemorrhage, hydrocephalus, extra-axial collection or mass lesion. Vascular: Normal flow voids at the skull base. Sinuses/Orbits: Opacification of the bilateral frontal sinuses. The orbits are grossly unremarkable. IMPRESSION: 1. Incomplete study . 2. No acute intracranial abnormality identified. 3. Bilateral frontal sinus disease. Electronically Signed   By: Baldemar Lenis M.D.   On: 11/10/2020 18:00   US RENAL  Result Date: 11/10/2020 CLINICAL DATA:  Rule out hydronephrosis EXAM: RENAL / URINARY TRACT ULTRASOUND COMPLETE COMPARISON:  None. FINDINGS: Right Kidney: Renal measurements: 8.7 x 5.1 x 4.7 cm = volume: 109 mL. Echogenicity within normal limits. No mass or hydronephrosis visualized. Left Kidney: Renal measurements: 10.0 x 4.7 x 4.6 cm = volume: 112 mL. Echogenicity within normal limits. No mass or hydronephrosis visualized. Bladder: Decompressed with Foley catheter in place. Other: None. IMPRESSION: No acute findings.  No hydronephrosis. Electronically Signed   By: Charlett Nose M.D.   On: 11/10/2020 21:40   CT C-SPINE NO CHARGE  Result Date:  11/09/2020 CLINICAL DATA:  Left-sided weakness EXAM: CT CERVICAL SPINE WITHOUT CONTRAST TECHNIQUE: Multidetector CT imaging of the cervical spine was performed without intravenous contrast. Multiplanar CT image reconstructions were also generated. COMPARISON:  None. FINDINGS: Alignment: Alignment is anatomic. Skull base and vertebrae: No acute fracture. No primary bone lesion or focal pathologic process. Soft tissues and spinal canal: Please refer to separately reported CT angiography of the neck for description of vascular findings. No prevertebral fluid or swelling. No visible canal hematoma. Disc levels: Bridging anterior osteophytes are seen throughout the entire cervical spine. Mild disc osteophyte complex at C4-5. No significant central canal or neural foraminal encroachment. Upper chest: Airway is patent. Lung apices are clear. Other: Reconstructed images confirm the above findings. IMPRESSION: 1. No acute cervical spine fracture. 2. Diffuse cervical spondylosis. No significant neural foraminal or central canal stenosis. Electronically Signed   By: Sharlet Salina M.D.   On: 11/09/2020 22:02   CT Code Stroke Cerebral Perfusion with contrast  Result Date: 11/09/2020 CLINICAL DATA:  Initial evaluation for acute stroke, left-sided weakness. EXAM: CT ANGIOGRAPHY HEAD AND NECK CT PERFUSION BRAIN TECHNIQUE: Multidetector CT imaging of the head and neck was performed using the standard protocol during bolus administration of intravenous contrast. Multiplanar CT image reconstructions and MIPs were obtained to evaluate the vascular anatomy. Carotid stenosis measurements (when applicable) are obtained utilizing NASCET criteria, using the distal internal carotid diameter as the denominator. Multiphase CT imaging of the brain was performed following IV bolus contrast injection. Subsequent parametric perfusion maps were calculated using RAPID software. CONTRAST:  100 cc of Omnipaque 350. COMPARISON:  Prior head CT from  earlier the same day. FINDINGS: CTA NECK FINDINGS Aortic arch: Visualized aortic arch of normal caliber with normal branch pattern. No hemodynamically significant stenosis about the origin of the great vessels. Visualized subclavian arteries widely patent. Right carotid system: Right common and internal carotid arteries widely patent without stenosis, dissection or occlusion. Left carotid system: Left CCA patent from its origin to the bifurcation without stenosis. Mild eccentric soft plaque at the origin of the left ICA without significant stenosis. Left ICA patent distally without stenosis, dissection or occlusion. Vertebral arteries: Both vertebral arteries arise from the subclavian arteries. Dominant left vertebral artery with a diffusely hypoplastic right vertebral artery. Left vertebral artery widely patent within the neck without stenosis or other abnormality. Severe stenosis at the origin of the right vertebral artery. Hypoplastic right vertebral artery otherwise patent within the neck without stenosis or other abnormality. Skeleton: No visible acute osseous abnormality, although cervical spine better evaluated on concomitant CT of the cervical spine. No discrete or worrisome osseous lesions. Other neck: No other acute soft tissue abnormality within the neck. No mass or adenopathy. Upper chest: Scattered atelectatic changes noted within the visualized lungs. Few scattered superimposed tree-in-bud nodular densities within the peripheral right upper lobe could reflect changes of acute bronchiolitis and/or mucoid impaction. No frank airspace consolidation. Review of the MIP images confirms the above findings CTA HEAD FINDINGS Anterior circulation: Petrous, cavernous, and supraclinoid segments of both internal carotid arteries are widely patent without stenosis. A1 segments patent bilaterally. Normal anterior communicating artery. 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 Finding presumably chronic  in nature given the chronic bilateral ACA territory infarcts seen on prior noncontrast head CT. Scant distal reconstitution, likely collateral. M1 segments patent bilaterally. Normal MCA bifurcations. Distal MCA branches well perfused and symmetric. Posterior circulation: Dominant left vertebral artery widely patent to the vertebrobasilar junction. Left PICA patent. Hypoplastic right vertebral artery grossly patent to the vertebrobasilar junction as well. Right PICA not visualized. Basilar patent to its distal aspect without stenosis. Superior cerebellar arteries patent bilaterally. Fetal type origin of the left PCA. Right PCA supplied via the basilar as well as a robust right posterior communicating artery. Scattered atheromatous irregularity within both PCAs without high-grade stenosis. Short-segment mild right P2 stenosis noted (series 10, image 19). Venous sinuses: Not well assessed due to timing of the contrast bolus. Anatomic variants: Fetal type origin of the left PCA. Hypoplastic right vertebral artery. 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 Review of the MIP images confirms the above findings CT Brain Perfusion Findings: ASPECTS: 10 CBF (<30%) Volume: 0mL Perfusion (Tmax>6.0s) volume: 18mL Mismatch Volume: 18mL Infarction Location:Negative CT perfusion for acute ischemia. Delayed perfusion seen within the right ACA distribution, in keeping with the chronic ACA occlusion. IMPRESSION: CTA HEAD AND NECK IMPRESSION: 1. Negative CTA for emergent large vessel occlusion. 2. 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 3. Scattered mild for age atheromatous change elsewhere about the major arterial vasculature of the head and neck. No other hemodynamically significant or correctable stenosis. 4. Few scattered tree-in-bud nodular densities within right upper lobe, nonspecific, but could reflect changes of acute bronchiolitis and/or mucoid impaction. No frank  airspace consolidation. CT PERFUSION IMPRESSION: 1. Negative CT perfusion for acute ischemia. 2. 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 Critical Value/emergent results were called by telephone at the time of interpretation on 11/09/2020 at 9:45 pm to provider [facility] , who  verbally acknowledged these results. Electronically Signed   By: Rise Mu M.D.   On: 11/09/2020 22:11   DG CHEST PORT 1 VIEW  Result Date: 11/11/2020 CLINICAL DATA:  PICC line placement EXAM: PORTABLE CHEST 1 VIEW COMPARISON:  11/09/2020 FINDINGS: Left upper extremity PICC line is been placed with its tip within the superior vena cava. Minimal left basilar atelectasis. Lungs are otherwise clear. No pneumothorax or pleural effusion. Cardiac size within normal limits. Pulmonary vascularity is normal. No acute bone abnormality. IMPRESSION: Left upper extremity PICC line tip within the superior vena cava. Electronically Signed   By: Helyn Numbers MD   On: 11/11/2020 13:12   DG Chest Port 1 View  Result Date: 11/09/2020 CLINICAL DATA:  Fall EXAM: PORTABLE CHEST 1 VIEW COMPARISON:  None. FINDINGS: The heart size and mediastinal contours are within normal limits. Both lungs are clear. The visualized skeletal structures are unremarkable. IMPRESSION: No active disease. Electronically Signed   By: Jonna Clark M.D.   On: 11/09/2020 22:38   DG Swallowing Func-Speech Pathology  Result Date: 11/11/2020 Objective Swallowing Evaluation: Type of Study: MBS-Modified Barium Swallow Study  Patient Details Name: Jeffery Christian MRN: [id number] Date of Birth: [birth year] Today's Date: 11/11/2020 Time: SLP Start Time (ACUTE ONLY): 1437 -SLP Stop Time (ACUTE ONLY): 1455 SLP Time Calculation (min) (ACUTE ONLY): 18 min Past Medical History: Past Medical History: Diagnosis Date . Chronic systolic CHF (congestive heart failure) (HCC)   EF 35-40, diffuse HK,  mild MR, moderate LAE, mild RAE, PASP 44, L pleural eff . Dilated cardiomyopathy (HCC)   likely related to tachycardia . Persistent atrial fibrillation [facility])  Past Surgical History: Past Surgical History: Procedure Laterality Date . CARDIOVERSION N/A 08/20/2017  Procedure: CARDIOVERSION;  Surgeon: [name], MD;  Location: [facility] ENDOSCOPY;  Service: Cardiovascular;  Laterality: N/A; . INCISION AND DRAINAGE ABSCESS Left 03/22/2014  Procedure: INCISION AND DRAINAGE ABSCESS LEFT BUTTOCK ABSCESS;  Surgeon: [name], MD;  Location: [facility] OR;  Service: General;  Laterality: Left; HPI: Pt is a 70 y.o. male with medical history significant for atrial fibrillation on Eliquis-last dose unknown, dilated cardiomyopathy, chronic systolic CHF, CKD 3 who presented after being found on the floor at home by his friends who had last seen him one week prior. EMT noticed increased weakness on the left and he was brought in as a code stroke; this was ultimately cancelled by neurology. MRI was negative for acute changes. Pt found to have COVID-19. CXR 2/8 was negative.  No data recorded Assessment / Plan / Recommendation CHL IP CLINICAL IMPRESSIONS 11/11/2020 Clinical Impression Cervical osteophytes were noted, but did not significantly impact swallow function. Pt demonstrated decreased bolus cohesion, a pharyngeal delay, and incomplete epiglottic inverversion with the initial bolus of thin liquids via cup which was administered by the SLP. Premature spillage was noted to the pyriform sinuses and aspiration (PAS 7) noted thereafter secondary to the pharyngeal delay and incomplete epiglottic inversion. Coughing was noted and effective in expelling some of the aspirated material. Subsequent swallows were significant for prolonged mastication, and mildly reduced lingual retraction, but his swallow was notably more timely with self-feeding and no subsequent instances of penetration or aspiration were noted even when challenged with  large sips and consecutive swallows. Mild to moderate vallecular residue was demonstrated with solids and was reduced to a functional level with a liquid wash. A dysphagia 3 diet with thin liquids is recommended at this time. Pt's swallow function and associated aspiration risk are much improved with  self-feeding and it is recommended that this be encouraged with supervision. SLP will follow to ensure diet tolerance. SLP Visit Diagnosis Dysphagia, unspecified (R13.10) Attention and concentration deficit following -- Frontal lobe and executive function deficit following -- Impact on safety and function Moderate aspiration risk;Mild aspiration risk   CHL IP TREATMENT RECOMMENDATION 11/11/2020 Treatment Recommendations Therapy as outlined in treatment plan below   Prognosis 11/11/2020 Prognosis for Safe Diet Advancement Good Barriers to Reach Goals Cognitive deficits Barriers/Prognosis Comment -- CHL IP DIET RECOMMENDATION 11/11/2020 SLP Diet Recommendations Dysphagia 3 (Mech soft) solids;Thin liquid Liquid Administration via Cup;Straw Medication Administration Whole meds with liquid Compensations Slow rate;Small sips/bites Postural Changes Remain semi-upright after after feeds/meals (Comment);Seated upright at 90 degrees   CHL IP OTHER RECOMMENDATIONS 11/11/2020 Recommended Consults -- Oral Care Recommendations Oral care BID Other Recommendations --   CHL IP FOLLOW UP RECOMMENDATIONS 11/11/2020 Follow up Recommendations (No Data)   CHL IP FREQUENCY AND DURATION 11/11/2020 Speech Therapy Frequency (ACUTE ONLY) min 2x/week Treatment Duration 2 weeks      CHL IP ORAL PHASE 11/11/2020 Oral Phase Impaired Oral - Pudding Teaspoon -- Oral - Pudding Cup -- Oral - Honey Teaspoon -- Oral - Honey Cup -- Oral - Nectar Teaspoon -- Oral - Nectar Cup WFL Oral - Nectar Straw WFL Oral - Thin Teaspoon -- Oral - Thin Cup Decreased bolus cohesion;Premature spillage Oral - Thin Straw WFL Oral - Puree WFL Oral - Mech Soft WFL Oral - Regular WFL  Oral - Multi-Consistency -- Oral - Pill WFL Oral Phase - Comment --  CHL IP PHARYNGEAL PHASE 11/11/2020 Pharyngeal Phase Impaired Pharyngeal- Pudding Teaspoon -- Pharyngeal -- Pharyngeal- Pudding Cup -- Pharyngeal -- Pharyngeal- Honey Teaspoon -- Pharyngeal -- Pharyngeal- Honey Cup -- Pharyngeal -- Pharyngeal- Nectar Teaspoon -- Pharyngeal -- Pharyngeal- Nectar Cup Reduced tongue base retraction Pharyngeal -- Pharyngeal- Nectar Straw WFL Pharyngeal -- Pharyngeal- Thin Teaspoon -- Pharyngeal -- Pharyngeal- Thin Cup Reduced tongue base retraction;Delayed swallow initiation-pyriform sinuses;Pharyngeal residue - valleculae;Pharyngeal residue - pyriform;Penetration/Aspiration during swallow;Penetration/Apiration after swallow;Moderate aspiration Pharyngeal Material enters airway, passes BELOW cords and not ejected out despite cough attempt by patient Pharyngeal- Thin Straw -- Pharyngeal -- Pharyngeal- Puree -- Pharyngeal -- Pharyngeal- Mechanical Soft -- Pharyngeal -- Pharyngeal- Regular -- Pharyngeal -- Pharyngeal- Multi-consistency -- Pharyngeal -- Pharyngeal- Pill -- Pharyngeal -- Pharyngeal Comment --  No flowsheet data found. Scheryl Marten 11/11/2020, 3:52 PM              DG HIP UNILAT WITH PELVIS 2-3 VIEWS RIGHT  Result Date: 11/10/2020 CLINICAL DATA:  Found down, right hip tenderness EXAM: DG HIP (WITH OR WITHOUT PELVIS) 2-3V RIGHT COMPARISON:  None. FINDINGS: Frontal view of the pelvis including both hips as well as frontal and frogleg lateral views of the right hip are obtained. No fracture, subluxation, or dislocation. Joint spaces are well preserved. Soft tissues are normal. IMPRESSION: 1. No acute displaced fracture. Electronically Signed   By: Sharlet Salina M.D.   On: 11/10/2020 19:09   CT HEAD CODE STROKE WO CONTRAST  Result Date: 11/09/2020 CLINICAL DATA:  Code stroke. Initial evaluation for acute left-sided weakness, stroke suspected. EXAM: CT HEAD WITHOUT CONTRAST TECHNIQUE: Contiguous axial  images were obtained from the base of the skull through the vertex without intravenous contrast. COMPARISON:  None available. FINDINGS: Brain: Age-related cerebral atrophy with chronic small vessel ischemic disease. Remote lacunar infarct present at the left caudate. Remote bilateral ACA territory infarcts noted. No acute intracranial hemorrhage. No acute large vessel territory infarct.  No mass lesion, mass effect or midline shift. Mild ventricular prominence related to global parenchymal volume loss without hydrocephalus. No extra-axial fluid collection. Vascular: No hyperdense vessel. Skull: Scalp soft tissues demonstrate no acute finding. Small lipoma noted at the posterior scalp. Calvarium intact. Sinuses/Orbits: Globes and orbital soft tissues within normal limits. Chronic frontal sinusitis noted. Scattered mucosal thickening noted elsewhere within the sphenoid ethmoidal sinuses. No mastoid effusion. Other: None. ASPECTS [facility] Stroke Program Early CT Score) - Ganglionic level infarction (caudate, lentiform nuclei, internal capsule, insula, M1-M3 cortex): 7 - Supraganglionic infarction (M4-M6 cortex): 3 Total score (0-10 with 10 being normal): 10 IMPRESSION: 1. No acute intracranial infarct or other abnormality. 2. ASPECTS is 10. 3. Remote bilateral ACA territory infarcts, with additional remote lacunar infarct at the left basal ganglia. 4. Underlying atrophy with chronic small vessel ischemic disease. These results were communicated to Dr. Wilford Corner at 9:31 pmon 2/8/2022by text page via the [facility] messaging system. Electronically Signed   By: Rise Mu M.D.   On: 11/09/2020 21:51   ECHOCARDIOGRAM LIMITED  Result Date: 11/10/2020    ECHOCARDIOGRAM LIMITED REPORT   Patient Name:   Jeffery Christian  Date of Exam: 11/10/2020 Medical Rec #:  [id number]  Height:       72.0 in Accession #:    [id number] Weight:       217.6 lb Date of Birth:  1951/09/12 BSA:          2.208 m Patient Age:    69 years   BP:            138/94 mmHg Patient Gender: M          HR:           138 bpm. Exam Location:  Inpatient Procedure: Limited Echo, Cardiac Doppler and Color Doppler Indications:    Atrial fibrillation  History:        Patient has prior history of Echocardiogram examinations, most                 recent 07/19/2017. CHF; Arrythmias:Atrial Fibrillation. Dilated                 cardiomyopathy. CKD.  Sonographer:    Ross Ludwig RDCS (AE) Referring Phys: [id number] [facility]  Sonographer Comments: Suboptimal parasternal window. IMPRESSIONS  1. Left ventricular ejection fraction, by estimation, is 60 to 65%. The left ventricle has normal function. The left ventricle has no regional wall motion abnormalities. There is moderate left ventricular hypertrophy. Left ventricular diastolic parameters are indeterminate.  2. Right ventricular systolic function is normal. The right ventricular size is normal.  3. Left atrial size was mildly dilated.  4. The mitral valve is normal in structure. Trivial mitral valve regurgitation. No evidence of mitral stenosis.  5. The aortic valve was not well visualized. Aortic valve regurgitation is not visualized. No aortic stenosis is present.  6. The inferior vena cava is dilated in size with >50% respiratory variability, suggesting right atrial pressure of 8 mmHg. FINDINGS  Left Ventricle: Left ventricular ejection fraction, by estimation, is 60 to 65%. The left ventricle has normal function. The left ventricle has no regional wall motion abnormalities. The left ventricular internal cavity size was normal in size. There is  moderate left ventricular hypertrophy. Left ventricular diastolic parameters are indeterminate. Right Ventricle: The right ventricular size is normal. No increase in right ventricular wall thickness. Right ventricular systolic function is normal. Left Atrium: Left atrial size was mildly dilated. Right Atrium: Right atrial  size was normal in size. Pericardium: There is no evidence of  pericardial effusion. Mitral Valve: The mitral valve is normal in structure. Trivial mitral valve regurgitation. No evidence of mitral valve stenosis. Tricuspid Valve: The tricuspid valve is normal in structure. Tricuspid valve regurgitation is mild . No evidence of tricuspid stenosis. Aortic Valve: The aortic valve was not well visualized. Aortic valve regurgitation is not visualized. No aortic stenosis is present. Pulmonic Valve: The pulmonic valve was normal in structure. Pulmonic valve regurgitation is not visualized. No evidence of pulmonic stenosis. Aorta: The aortic root is normal in size and structure. Venous: The inferior vena cava is dilated in size with greater than 50% respiratory variability, suggesting right atrial pressure of 8 mmHg. IAS/Shunts: No atrial level shunt detected by color flow Doppler. LEFT VENTRICLE PLAX 2D LVIDd:         4.80 cm LVIDs:         3.40 cm LV PW:         1.60 cm LV IVS:        1.50 cm LVOT diam:     2.10 cm LVOT Area:     3.46 cm  IVC IVC diam: 1.70 cm LEFT ATRIUM         Index LA diam:    3.90 cm 1.77 cm/m   AORTA Ao Root diam: 3.60 cm  SHUNTS Systemic Diam: 2.10 cm Charlton Haws MD Electronically signed by Charlton Haws MD Signature Date/Time: 11/10/2020/3:42:53 PM    Final    [place] EKG SITE RITE  Result Date: 11/11/2020 If Site Rite image not attached, placement could not be confirmed due to current cardiac rhythm.

## 2020-11-13 NOTE — Progress Notes (Signed)
Neurology Progress Note  Subjective: Patient with some improvement in neurologic status since previous neurology assessment.  Patient awake, laying in bed, without complaint at the time of the assessment, in no acute distress.  He tells me he was found in his house because he fell and could not get up and states "I was there a while, I guess" When asked about lower extremity weakness at baseline he states "most of the time"  Exam: Vitals:   11/13/20 0744 11/13/20 1100  BP: 121/83 130/81  Pulse: 83 79  Resp: 16 18  Temp: 98.3 F (36.8 C) 98 F (36.7 C)  SpO2: 97% 98%   Gen: In bed, no acute distress Resp: non-labored breathing, oxygenating well on cardiac monitor Abd: soft, non-distended  Neuro: Mental status: Patient is awake and alert to self and somewhat to situation but does not elaborate further than "I fell" and "I was found in my house". When asked the year he reads the clock on the wall and states 12:15, he does not give answer to the month. He states he is 70 years old. Speech is dysarthric without aphasia. No neglect noted. He is unable to give a clear or coherent history of present illness. He is able to follow simple commands.  Cranial Nerves: II: Visual Fields are full. Pupils are equal, round, and reactive to light 2 mm/brisk.  III,IV, VI: EOMI without ptosis. V: Facial sensation is symmetric to temperature and light touch VII: Facial movement is symmetric resting and smiling VIII: Hearing is intact to voice X: Palate elevates symmetrically, patient is hypophonic  XI: Shoulder shrug is symmetric but weak.  XII: Tongue protrudes midline without atrophy or fasciculations.  Motor: Tone is normal. Bulk is normal. All extremities weak, left upper slightly weaker than right upper extremity. 4-/5 bilateral upper extremities with antigravity movement. Grip strength unequal with right stronger than left. Left wrist weakness with wrist drop noted. Patient able to give "thumbs  up" weakly on the left.  Lower extremities 3-/5 bilaterally, attempts to overcome gravity with immediate drift to bed. Ankle dorsiflexion and plantar flexion weak 3-/5. Hip flexion 2-/5 bilaterally.  No pronator drift of upper extremities  Sensory: Sensation is symmetric to light touch and temperature in the arms and legs. Deep Tendon Reflexes: 2+ and symmetric in the biceps and brachioradialis and 1+ patellae.  Plantars: Toes are downgoing bilaterally.  Cerebellar: Unable to assess, patient with significant weakness (HKS) and trouble following complex commands (FNF)   Pertinent Labs: CBC    Component Value Date/Time   WBC 6.8 11/13/2020 0304   RBC 4.15 (L) 11/13/2020 0304   HGB 11.9 (L) 11/13/2020 0304   HCT 35.9 (L) 11/13/2020 0304   PLT 191 11/13/2020 0304   MCV 86.5 11/13/2020 0304   MCH 28.7 11/13/2020 0304   MCHC 33.1 11/13/2020 0304   RDW 15.9 (H) 11/13/2020 0304   LYMPHSABS 0.8 11/13/2020 0304   MONOABS 0.4 11/13/2020 0304   EOSABS 0.1 11/13/2020 0304   BASOSABS 0.0 11/13/2020 0304   CMP     Component Value Date/Time   NA 150 (H) 11/13/2020 0304   NA 146 (H) 06/08/2020 1645   K 3.7 11/13/2020 0304   CL 125 (H) 11/13/2020 0304   CO2 19 (L) 11/13/2020 0304   GLUCOSE 143 (H) 11/13/2020 0304   BUN 49 (H) 11/13/2020 0304   BUN 13 06/08/2020 1645   CREATININE 1.68 (H) 11/13/2020 0304   CALCIUM 8.3 (L) 11/13/2020 0304   PROT 4.8 (L) 11/13/2020 0304  PROT 6.6 06/08/2020 1645   ALBUMIN 2.1 (L) 11/13/2020 0304   ALBUMIN 3.9 06/08/2020 1645   AST 32 11/13/2020 0304   ALT 41 11/13/2020 0304   ALKPHOS 64 11/13/2020 0304   BILITOT 0.7 11/13/2020 0304   BILITOT 0.4 06/08/2020 1645   GFRNONAA 44 (L) 11/13/2020 0304   GFRAA 56 (L) 06/08/2020 1645   Lab Results  Component Value Date   CHOL 139 10/28/2018   HDL 39 (L) 10/28/2018   LDLCALC 65 10/28/2018   TRIG 174 (H) 10/28/2018   CHOLHDL 3.6 10/28/2018   Lab Results  Component Value Date   HGBA1C 5.3 06/08/2020    Imaging I have reviewed the images obtained:  CT-scan of the brain 1. No acute intracranial infarct or other abnormality. 2. ASPECTS is 10. 3. Remote bilateral ACA territory infarcts, with additional remote lacunar infarct at the left basal ganglia. 4. Underlying atrophy with chronic small vessel ischemic disease  CTA head and neck with no emergent LVO.  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  CT perfusion with a right frontal penumbra of 18 cc  Impression: 70 year old man with last known normal 7 days prior to hospital arrival, found down and unable to move all of his extremities with EMTs noting left-sided weakness more profound than the right-sided weakness on site-brought in as a code stroke. CT head with bilateral ACA strokes-chronic appearing in addition to lacunar infarcts also chronic appearing. CTA head and neck with no emergent LVO-did show  2 occlusions bilaterally-likely chronic given the hypodensity on the CT head as well. Neurology re-consulted for weak right lower extremity and left wrist drop. - Assessment significant for improving mental status with IV hydration and correction of metabolic and infectious derangements (hypernatremia, hyperkalemia, UTI, AKI, COVID-19 positive).  - Contacted by primary team for concern of left wrist drop and acute right lower extremity weakness. 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 On reassessment 11/13/20, equal lower extremity weakness appreciated in the right and left lower extremities. Bilateral lower extremity weakness is consistent with chronic ACA bilateral infarcts. Of note, right knee with significant osteoarthritis on x-ray may contribute to weakness on physical assessment. - Found to be severely hypernatremic with an AKI superimposed on CKD stage III likely due to dehydration, mental status improvement with gradual  normalization of serum sodium levels.   Recommendations: - Continue managing metabolic/infectious derangements and hydration status  - Atrial fibrillation being managed per primary team; on diltiazem and Lovenox. On Eliquis at home, management per cardiology. Resume AC on discharge.  - At risk for delirium, try to minimize deliriogenic medications as much as possible (J Am Geriatr Soc. 2012 Apr;60(4):616-31): benzodiazepines, anticholinergics, diphenhydramine, antihistamines, narcotics, Ambien/Lunesta/Sonata etc. - Environmental support for delirium: Lights on during the day, quiet dimly lit room at night, reorient patient often, minimize sleep disruptions as much as possible overnight.   - Continue PT/OT as appropriate; evaluate left wrist drop, determine if splint necessary - Gentle rehydration. Do not correct Na by more than 10 meq/L per day as faster correction can result in central pontine myelinolysis.  - Neurology will be available for further questions  Lanae Boast, AGACNP-BC [facility] [phone]  Electronically signed: Dr. Caryl Pina

## 2020-11-13 NOTE — Progress Notes (Signed)
Physical Therapy Wound Treatment Patient Details  Name: Jeffery Christian MRN: [id number] Date of Birth: Jun 04, 1951  Today's Date: 11/13/2020 Time: 0915-1000 Time Calculation (min): 45 min  Subjective  Subjective: Pictures below show wound before and after initial debridement Patient and Family Stated Goals: did not state Prior Treatments: dressing change  Pain Score: 2/10 Wound Assessment  Pressure Injury Buttocks Right Unstageable - Full thickness tissue loss in which the base of the injury is covered by slough (yellow, tan, gray, green or brown) and/or eschar (tan, brown or black) in the wound bed. (Active)  Wound Image    11/13/20 1011  Dressing Type ABD;Barrier Film (skin prep);Gauze (Comment);Santyl;Moist to dry 11/13/20 1011  Dressing Changed;Clean;Dry 11/13/20 1011  Dressing Change Frequency Daily 11/13/20 1011  State of Healing Eschar 11/13/20 1011  Site / Wound Assessment Yellow;Black 11/13/20 1011  % Wound base Red or Granulating 0% 11/13/20 1011  % Wound base Yellow/Fibrinous Exudate 0% 11/13/20 1011  % Wound base Black/Eschar 100% 11/13/20 1011  % Wound base Other/Granulation Tissue (Comment) 0% 11/13/20 1011  Peri-wound Assessment Excoriated;Pink 11/13/20 1011  Wound Length (cm) 11 cm 11/13/20 1011  Wound Width (cm) 9 cm 11/13/20 1011  Wound Depth (cm) 3 cm 11/13/20 1011  Wound Surface Area (cm^2) 99 cm^2 11/13/20 1011  Wound Volume (cm^3) 297 cm^3 11/13/20 1011  Tunneling (cm) 0 11/12/20 0746  Undermining (cm) 0 11/12/20 0746  Margins Unattached edges (unapproximated) 11/13/20 1011  Drainage Amount None 11/13/20 1011  Drainage Description Serosanguineous 11/12/20 1950  Treatment Debridement (Selective);Hydrotherapy (Pulse lavage);Packing (Saline gauze) 11/13/20 1011      Hydrotherapy Pulsed lavage therapy - wound location: R buttocks Pulsed Lavage with Suction (psi): 12 psi Pulsed Lavage with Suction - Normal Saline Used: 1000 mL Pulsed Lavage Tip: Tip with splash  shield Selective Debridement Selective Debridement - Location: R buttocks Selective Debridement - Tools Used: Forceps;Scalpel;Scissors Selective Debridement - Tissue Removed: hardened eschar, adipose   Wound Assessment and Plan  Wound Therapy - Assess/Plan/Recommendations Wound Therapy - Clinical Statement: Pt presents to hydrotherapy with unstageable right buttocks pressure injury. Top layer of hardened eschar debrided today to reveal necrotic adipose. Anticipate wound depth will continue to increase. Pt will benefit from hydrotherapy to perform selective debridement and decrease bioburden. Wound Therapy - Functional Problem List: immobility Factors Delaying/Impairing Wound Healing: Immobility Hydrotherapy Plan: Debridement;Patient/family education;Pulsatile lavage with suction Wound Therapy - Frequency: 6X / week Wound Therapy - Follow Up Recommendations: Skilled nursing facility Wound Plan: see above  Wound Therapy Goals- Improve the function of patient's integumentary system by progressing the wound(s) through the phases of wound healing (inflammation - proliferation - remodeling) by: Decrease Necrotic Tissue to: 60 Decrease Necrotic Tissue - Progress: Goal set today Increase Granulation Tissue to: 40 Increase Granulation Tissue - Progress: Goal set today Goals/treatment plan/discharge plan were made with and agreed upon by patient/family: Yes Time For Goal Achievement: 7 days Wound Therapy - Potential for Goals: Fair  Goals will be updated until maximal potential achieved or discharge criteria met.  Discharge criteria: when goals achieved, discharge from hospital, MD decision/surgical intervention, no progress towards goals, refusal/missing three consecutive treatments without notification or medical reason.  GP  Wyona Almas, PT, DPT Acute Rehabilitation Services Pager [phone] Office [phone]      Deno Etienne 11/13/2020, 11:12 AM

## 2020-11-13 NOTE — Progress Notes (Signed)
[facility] Progress Note    Assessment/ Plan:    Hypovolemic hypernatremia: Initial sodium level of 168.Marland Kitchen  Fluids were discontinued yesterday the patient had a diet.  A.m. labs show sodium at 150, creatinine improved to 1.68. -Continue to monitor p.o. intake -We will check a.m. labs  AKI on CKD stage III: Baseline creatinine appears to be ~1.4. Creatinine on presentation of 3.10.  Creatinine improved 1.68.  BUN elevated but improved at 49.  Patient with 1.4 L fluid up since admission.  Physical exam shows trace edema in bilateral lower extremities. -Continue to monitor daily Cr, -Monitor Daily I/Os, Daily weight  -Avoid nephrotoxic medications  Hypertension: Patient with some elevated blood pressure since arrival.  Now with most recent blood pressure 120/75.   Hyperkalemia Potassium of 3.7 this morning.  Will monitor with a.m. labs.  Atrial fib with RVR: Currently on diltiazem drip.  Current heart rate of 78. Per primary.  Acute metabolic encephalopathy:  Patient speaking in full sentences now, seems improved.  Per primary team/neurology.  COVID-19 infection-Per primary  Subjective:   Patient without complaints this morning.  States he is eating well.   Objective:   BP 120/75 (BP Location: Right Arm)   Pulse 78   Temp 97.7 F (36.5 C) (Oral)   Resp 18   Ht 6' (1.829 m)   Wt 98.7 kg   SpO2 100%   BMI 29.51 kg/m   Intake/Output Summary (Last 24 hours) at 11/13/2020 0720 Last data filed at 11/13/2020 0159 Gross per 24 hour  Intake 815.82 ml  Output 1775 ml  Net -959.18 ml   Weight change:   Physical Exam: General: Alert and oriented in no apparent distress Heart: Regular rate and rhythm with no murmurs appreciated Lungs: CTA bilaterally Extremities: Trace lower extremity edema  Imaging: EEG  Result Date: 11/12/2020 Charlsie Quest, MD     11/12/2020 10:37 AM Patient Name: Jeffery Christian MRN: [id number] Epilepsy Attending: Charlsie Quest  Referring Physician/Provider: Dr Caryl Pina Date: 11/12/2020 Duration: 23.19 mins Patient history: 70 yo M with ams. EEG to evaluate for seizure Level of alertness: Awake, drowsy, sleep, comatose, lethargic AEDs during EEG study: None Technical aspects: This EEG study was done with scalp electrodes positioned according to the 10-20 International system of electrode placement. Electrical activity was acquired at a sampling rate of 500Hz  and reviewed with a high frequency filter of 70Hz  and a low frequency filter of 1Hz . EEG data were recorded continuously and digitally stored. Description: The posterior dominant rhythm consists of 8-9 Hz activity of moderate voltage (25-35 uV) seen predominantly in posterior head regions, symmetric and reactive to eye opening and eye closing. Hyperventilation and photic stimulation were not performed.   IMPRESSION: This study is within normal limits. No seizures or epileptiform discharges were seen throughout the recording. Priyanka   DG CHEST PORT 1 VIEW  Result Date: 11/11/2020 CLINICAL DATA:  PICC line placement EXAM: PORTABLE CHEST 1 VIEW COMPARISON:  11/09/2020 FINDINGS: Left upper extremity PICC line is been placed with its tip within the superior vena cava. Minimal left basilar atelectasis. Lungs are otherwise clear. No pneumothorax or pleural effusion. Cardiac size within normal limits. Pulmonary vascularity is normal. No acute bone abnormality. IMPRESSION: Left upper extremity PICC line tip within the superior vena cava. Electronically Signed   By: Annabelle Harman MD   On: 11/11/2020 13:12   DG Swallowing Func-Speech Pathology  Result Date: 11/11/2020 Objective Swallowing Evaluation: Type of Study: MBS-Modified Barium Swallow  Study  Patient Details Name: Jeffery Christian MRN: [id number] Date of Birth: August 17, 1951 Today's Date: 11/11/2020 Time: SLP Start Time (ACUTE ONLY): 1437 -SLP Stop Time (ACUTE ONLY): 1455 SLP Time Calculation (min) (ACUTE ONLY): 18 min Past  Medical History: Past Medical History: Diagnosis Date . Chronic systolic CHF (congestive heart failure) (HCC)   EF 35-40, diffuse HK, mild MR, moderate LAE, mild RAE, PASP 44, L pleural eff . Dilated cardiomyopathy (HCC)   likely related to tachycardia . Persistent atrial fibrillation [facility])  Past Surgical History: Past Surgical History: Procedure Laterality Date . CARDIOVERSION N/A 08/20/2017  Procedure: CARDIOVERSION;  Surgeon: [name], MD;  Location: [facility] ENDOSCOPY;  Service: Cardiovascular;  Laterality: N/A; . INCISION AND DRAINAGE ABSCESS Left 03/22/2014  Procedure: INCISION AND DRAINAGE ABSCESS LEFT BUTTOCK ABSCESS;  Surgeon: [name], MD;  Location: [facility] OR;  Service: General;  Laterality: Left; HPI: Pt is a 70 y.o. male with medical history significant for atrial fibrillation on Eliquis-last dose unknown, dilated cardiomyopathy, chronic systolic CHF, CKD 3 who presented after being found on the floor at home by his friends who had last seen him one week prior. EMT noticed increased weakness on the left and he was brought in as a code stroke; this was ultimately cancelled by neurology. MRI was negative for acute changes. Pt found to have COVID-19. CXR 2/8 was negative.  No data recorded Assessment / Plan / Recommendation CHL IP CLINICAL IMPRESSIONS 11/11/2020 Clinical Impression Cervical osteophytes were noted, but did not significantly impact swallow function. Pt demonstrated decreased bolus cohesion, a pharyngeal delay, and incomplete epiglottic inverversion with the initial bolus of thin liquids via cup which was administered by the SLP. Premature spillage was noted to the pyriform sinuses and aspiration (PAS 7) noted thereafter secondary to the pharyngeal delay and incomplete epiglottic inversion. Coughing was noted and effective in expelling some of the aspirated material. Subsequent swallows were significant for prolonged mastication, and mildly reduced lingual retraction, but his swallow was  notably more timely with self-feeding and no subsequent instances of penetration or aspiration were noted even when challenged with large sips and consecutive swallows. Mild to moderate vallecular residue was demonstrated with solids and was reduced to a functional level with a liquid wash. A dysphagia 3 diet with thin liquids is recommended at this time. Pt's swallow function and associated aspiration risk are much improved with self-feeding and it is recommended that this be encouraged with supervision. SLP will follow to ensure diet tolerance. SLP Visit Diagnosis Dysphagia, unspecified (R13.10) Attention and concentration deficit following -- Frontal lobe and executive function deficit following -- Impact on safety and function Moderate aspiration risk;Mild aspiration risk   CHL IP TREATMENT RECOMMENDATION 11/11/2020 Treatment Recommendations Therapy as outlined in treatment plan below   Prognosis 11/11/2020 Prognosis for Safe Diet Advancement Good Barriers to Reach Goals Cognitive deficits Barriers/Prognosis Comment -- CHL IP DIET RECOMMENDATION 11/11/2020 SLP Diet Recommendations Dysphagia 3 (Mech soft) solids;Thin liquid Liquid Administration via Cup;Straw Medication Administration Whole meds with liquid Compensations Slow rate;Small sips/bites Postural Changes Remain semi-upright after after feeds/meals (Comment);Seated upright at 90 degrees   CHL IP OTHER RECOMMENDATIONS 11/11/2020 Recommended Consults -- Oral Care Recommendations Oral care BID Other Recommendations --   CHL IP FOLLOW UP RECOMMENDATIONS 11/11/2020 Follow up Recommendations (No Data)   CHL IP FREQUENCY AND DURATION 11/11/2020 Speech Therapy Frequency (ACUTE ONLY) min 2x/week Treatment Duration 2 weeks      CHL IP ORAL PHASE 11/11/2020 Oral Phase Impaired Oral - Pudding Teaspoon -- Oral -  Pudding Cup -- Oral - Honey Teaspoon -- Oral - Honey Cup -- Oral - Nectar Teaspoon -- Oral - Nectar Cup WFL Oral - Nectar Straw WFL Oral - Thin Teaspoon -- Oral -  Thin Cup Decreased bolus cohesion;Premature spillage Oral - Thin Straw WFL Oral - Puree WFL Oral - Mech Soft WFL Oral - Regular WFL Oral - Multi-Consistency -- Oral - Pill WFL Oral Phase - Comment --  CHL IP PHARYNGEAL PHASE 11/11/2020 Pharyngeal Phase Impaired Pharyngeal- Pudding Teaspoon -- Pharyngeal -- Pharyngeal- Pudding Cup -- Pharyngeal -- Pharyngeal- Honey Teaspoon -- Pharyngeal -- Pharyngeal- Honey Cup -- Pharyngeal -- Pharyngeal- Nectar Teaspoon -- Pharyngeal -- Pharyngeal- Nectar Cup Reduced tongue base retraction Pharyngeal -- Pharyngeal- Nectar Straw WFL Pharyngeal -- Pharyngeal- Thin Teaspoon -- Pharyngeal -- Pharyngeal- Thin Cup Reduced tongue base retraction;Delayed swallow initiation-pyriform sinuses;Pharyngeal residue - valleculae;Pharyngeal residue - pyriform;Penetration/Aspiration during swallow;Penetration/Apiration after swallow;Moderate aspiration Pharyngeal Material enters airway, passes BELOW cords and not ejected out despite cough attempt by patient Pharyngeal- Thin Straw -- Pharyngeal -- Pharyngeal- Puree -- Pharyngeal -- Pharyngeal- Mechanical Soft -- Pharyngeal -- Pharyngeal- Regular -- Pharyngeal -- Pharyngeal- Multi-consistency -- Pharyngeal -- Pharyngeal- Pill -- Pharyngeal -- Pharyngeal Comment --  No flowsheet data found. Scheryl Marten 11/11/2020, 3:52 PM              [place] EKG SITE RITE  Result Date: 11/11/2020 If [facility] image not attached, placement could not be confirmed due to current cardiac rhythm.   Labs: BMET Recent Labs  Lab 11/10/20 1508 11/10/20 2000 11/11/20 0035 11/11/20 1500 11/11/20 2202 11/12/20 0345 11/12/20 1500 11/13/20 0304  NA 167* 164* 163* 159* 154* 150* 151* 150*  K 4.1 4.0 3.8 3.5 5.6* 3.5 3.7 3.7  CL >130* >130* >130* >130* 129* 124* 122* 125*  CO2 20* 17* 17* 17* 14* 17* 18* 19*  GLUCOSE 152* 210* 212* 183* 160* 176* 133* 143*  BUN 85* 85* 86* 74* 71* 62* 53* 49*  CREATININE 2.58* 2.55* 2.46* 2.26* 2.45* 2.20* 1.90* 1.68*   CALCIUM 8.5* 8.3* 8.6* 8.4* 8.0* 8.1* 8.2* 8.3*  PHOS 3.5  --   --   --   --   --   --   --    CBC Recent Labs  Lab 11/10/20 0804 11/11/20 0035 11/12/20 0345 11/13/20 0304  WBC 9.4 9.1 8.6 6.8  NEUTROABS 8.3* 8.2* 7.2 5.5  HGB 14.9 12.8* 11.7* 11.9*  HCT 50.7 42.8 36.7* 35.9*  MCV 92.5 91.6 89.5 86.5  PLT 318 252 208 191    Medications:    . Chlorhexidine Gluconate Cloth  6 each Topical Daily  . collagenase   Topical Daily  . enoxaparin (LOVENOX) injection  90 mg Subcutaneous Q12H  . feeding supplement  237 mL Oral BID BM  . metoprolol tartrate  25 mg Oral BID  . neomycin-bacitracin-polymyxin   Topical BID  . pantoprazole (PROTONIX) IV  40 mg Intravenous Q24H  . sodium chloride flush  10-40 mL Intracatheter Q12H     Jackelyn Poling, DO [facility] Family Medicine Resident, PGY2 11/13/2020, 7:20 AM

## 2020-11-14 DIAGNOSIS — I4891 Unspecified atrial fibrillation: Secondary | ICD-10-CM | POA: Diagnosis not present

## 2020-11-14 DIAGNOSIS — N179 Acute kidney failure, unspecified: Secondary | ICD-10-CM | POA: Diagnosis not present

## 2020-11-14 LAB — CBC WITH DIFFERENTIAL/PLATELET
Abs Immature Granulocytes: 0.14 10*3/uL — ABNORMAL HIGH (ref 0.00–0.07)
Basophils Absolute: 0 10*3/uL (ref 0.0–0.1)
Basophils Relative: 0 %
Eosinophils Absolute: 0.2 10*3/uL (ref 0.0–0.5)
Eosinophils Relative: 2 %
HCT: 37 % — ABNORMAL LOW (ref 39.0–52.0)
Hemoglobin: 12.3 g/dL — ABNORMAL LOW (ref 13.0–17.0)
Immature Granulocytes: 2 %
Lymphocytes Relative: 12 %
Lymphs Abs: 1.1 10*3/uL (ref 0.7–4.0)
MCH: 28.3 pg (ref 26.0–34.0)
MCHC: 33.2 g/dL (ref 30.0–36.0)
MCV: 85.1 fL (ref 80.0–100.0)
Monocytes Absolute: 0.5 10*3/uL (ref 0.1–1.0)
Monocytes Relative: 5 %
Neutro Abs: 7.5 10*3/uL (ref 1.7–7.7)
Neutrophils Relative %: 79 %
Platelets: 214 10*3/uL (ref 150–400)
RBC: 4.35 MIL/uL (ref 4.22–5.81)
RDW: 15.9 % — ABNORMAL HIGH (ref 11.5–15.5)
WBC: 9.4 10*3/uL (ref 4.0–10.5)
nRBC: 0 % (ref 0.0–0.2)

## 2020-11-14 LAB — COMPREHENSIVE METABOLIC PANEL
ALT: 47 U/L — ABNORMAL HIGH (ref 0–44)
AST: 38 U/L (ref 15–41)
Albumin: 2 g/dL — ABNORMAL LOW (ref 3.5–5.0)
Alkaline Phosphatase: 71 U/L (ref 38–126)
Anion gap: 8 (ref 5–15)
BUN: 37 mg/dL — ABNORMAL HIGH (ref 8–23)
CO2: 21 mmol/L — ABNORMAL LOW (ref 22–32)
Calcium: 8.3 mg/dL — ABNORMAL LOW (ref 8.9–10.3)
Chloride: 119 mmol/L — ABNORMAL HIGH (ref 98–111)
Creatinine, Ser: 1.41 mg/dL — ABNORMAL HIGH (ref 0.61–1.24)
GFR, Estimated: 54 mL/min — ABNORMAL LOW (ref >60)
Glucose, Bld: 122 mg/dL — ABNORMAL HIGH (ref 70–99)
Potassium: 3.9 mmol/L (ref 3.5–5.1)
Sodium: 148 mmol/L — ABNORMAL HIGH (ref 135–145)
Total Bilirubin: 0.7 mg/dL (ref 0.3–1.2)
Total Protein: 4.8 g/dL — ABNORMAL LOW (ref 6.5–8.1)

## 2020-11-14 LAB — FOLATE: Folate: 5.1 ng/mL — ABNORMAL LOW (ref >5.9)

## 2020-11-14 LAB — VITAMIN B12: Vitamin B-12: 482 pg/mL (ref 180–914)

## 2020-11-14 LAB — MAGNESIUM: Magnesium: 2.2 mg/dL (ref 1.7–2.4)

## 2020-11-14 LAB — AMMONIA: Ammonia: 17 umol/L (ref 9–35)

## 2020-11-14 LAB — TSH: TSH: 2.627 u[IU]/mL (ref 0.350–4.500)

## 2020-11-14 MED ORDER — TAMSULOSIN HCL 0.4 MG PO CAPS
0.4000 mg | ORAL_CAPSULE | Freq: Every day | ORAL | Status: DC
Start: 2020-11-14 — End: 2020-11-17
  Administered 2020-11-14 – 2020-11-16 (×3): 0.4 mg via ORAL
  Filled 2020-11-14 (×3): qty 1

## 2020-11-14 MED ORDER — PANTOPRAZOLE SODIUM 40 MG PO TBEC
40.0000 mg | DELAYED_RELEASE_TABLET | Freq: Every day | ORAL | Status: DC
Start: 2020-11-15 — End: 2020-11-17
  Administered 2020-11-15 – 2020-11-16 (×2): 40 mg via ORAL
  Filled 2020-11-14 (×2): qty 1

## 2020-11-14 NOTE — Progress Notes (Signed)
PROGRESS NOTE                                                                             PROGRESS NOTE                                                                                                                                                                                                             Patient Demographics:    Jeffery Christian, is a 70 y.o. male, DOB - Jul 07, 1951, ZOX:[id number]  Outpatient Primary MD for the patient is Hoy Register, MD    LOS - 5  Admit date - 11/09/2020    Chief Complaint  Patient presents with  . Code Stroke       Brief Narrative     HPI: Elio Haden is a 70 y.o. male with medical history significant for atrial fibrillation on Eliquis-last dose unknown, dilated cardiomyopathy, chronic systolic CHF, CKD 3 who presents by EMS after being found on the floor at home by his friends.  He was last seen about 1 week ago by his friends.  His friends not heard from him in the last week so they went to his house to check on him and upon getting no response at his door, knocked the door down and found him down on the ground between a dresser in the bed.  He was not talking at the time. He was weak all over but the EMTs noted him to be weaker on the left in comparison to the right and brought to ER as a code stroke.  He was unable to provide any history. He was able to follow simple commands He had a strong smell of urine and EMTs reported urine and stool around him when they picked him up from his house.  ED Course: He was found to have elevated heart rate in the 1 60-1 80 range.  EKG showed atrial fibrillation with RVR.  He was started on Cardizem infusion in the emergency room but continues to have elevated heart rate.  CT the  head was negative for acute stroke.  Neurology was consulted and has evaluated patient.  Patient was found to be hypernatremic on labs with sodium of 168. Troponin was elevated at 56    Subjective:     Susanne Greenhouse acne second events overnight, tolerated hydrotherapy yesterday, no fever, chills, shortness of breath or chest pain .  Assessment  & Plan :    Principal Problem:   Atrial fibrillation with RVR (HCC) Active Problems:   Acute renal failure superimposed on stage 3 chronic kidney disease (HCC)   Acute metabolic encephalopathy   Hypernatremia   Elevated troponin   COVID-19 virus infection  Hypernatremia -Severe, due to hypovolemia, in the setting of volume depletion and dehydration. -Improving gradually, avoid rapid correction . -Sodium is 149, he was encouraged to increase free water intake .  COVID-19 infection -Patient tells me he is vaccinated earlier last year, but not boosted -No evidence of pneumonia on x-ray, no  oxygen requirement -Continue  with Remdesivir, no indication for steroids -Will need isolation for 10 days total  Acute metabolic encephalopathy: -Significantly improved -MRI incomplete study, no evidence of acute CVA or acute findings. -as well in the setting of hypernatremia and UTI - EEG within normal limits, no seizures or epileptiform discharges -I have discussed with daughter, there is no history of alcohol or drug abuse  Ambulatory dysfunction -Patient with bilateral lower extremity weakness, left wrist drop in the setting of gradual nerve compression neuropathy due to prolonged downtime, and bilateral lower extremity weakness consistent with chronic ACA bilateral infarcts. -With PT/OT, recommendation for SNF placement  AKI on CKD stage III: -Due to volume depletion and dehydration -CK within normal limit. -Renal ultrasound with no hydronephrosis - on p.o. bicarb. Given low bicarb levels. -Improving  A fib with RVR -Usually on amiodarone drip, but with poor control, transition to Cardizem drip, this has significantly improved, Cardizem drip discontinued today, continue with p.o. metoprolol . -On Eliquis at home, he is on Lovenox during  hospital stay.    Hypertension:  -Blood pressure acceptable on metoprolol  Hyperkalemia:  -Improving  Elevated ALT -Mild, due to Covid, monitor closely as on remdesivir  UTI -Continue with Rocephin x3 days, urine culture unhelpful as growing multiple species.  Right gluteal unstageable pressure ulcer -This is present on admission, and right buttock area, as he was on the floor for unknown period of time, wound care has been consulted, continue with hydrotherapy   Pressure Injury Buttocks Right Unstageable - Full thickness tissue loss in which the base of the injury is covered by slough (yellow, tan, gray, green or brown) and/or eschar (tan, brown or black) in the wound bed. (Active)     Location: Buttocks  Location Orientation: Right  Staging: Unstageable - Full thickness tissue loss in which the base of the injury is covered by slough (yellow, tan, gray, green or brown) and/or eschar (tan, brown or black) in the wound bed.  Wound Description (Comments):   Present on Admission: Yes   Wounds at glans penis, around Foley insertion site -Discussed with urology Dr. Liliane Shi, he recommends local wound care including Neosporin and washing with soap and water, and outpatient follow-up with him once stable.  Urinary retention -Failed voiding trial 2/12, Foley catheter reinserted, will start on Flomax  Right arm swelling due to infiltration at IV site -No evidence of infection, continue with right arm elevation, IV access has been moved to the left arm.   SpO2: 97 % O2 Flow Rate (L/min): 3  L/min  Recent Labs  Lab 11/09/20 2113 11/09/20 2120 11/09/20 2242 11/10/20 0051 11/10/20 0804 11/10/20 1508 11/11/20 0035 11/12/20 0345 11/13/20 0304 11/14/20 0310  WBC  --   --   --   --  9.4  --  9.1 8.6 6.8 9.4  PLT  --   --   --   --  318  --  252 208 191 214  CRP  --   --   --   --  2.7*  --  2.8* 2.0* 3.5*  --   AST  --   --   --   --  21  --  24 27 32 38  ALT  --   --   --   --   25  --  24 26 41 47*  ALKPHOS  --   --   --   --  62  --  56 56 64 71  BILITOT  --   --   --   --  1.4*  --  1.0 0.6 0.7 0.7  ALBUMIN  --   --   --   --  2.5* 2.6* 2.3* 2.2* 2.1* 2.0*  INR  --   --  1.4*  --   --   --   --   --   --   --   LATICACIDVEN  --  2.4*  --  3.2*  --   --   --   --   --   --   SARSCOV2NAA POSITIVE*  --   --   --   --   --   --   --   --   --        ABG  No results found for: PHART, PCO2ART, PO2ART, HCO3, TCO2, ACIDBASEDEF, O2SAT     Condition - Extremely Guarded  Family Communication  :  D/W daughter by phone daily, left voicemail 2/12,2/13  Code Status :  full  Consults  :  Renal, Neurology  Disposition Plan  :    Status is: Inpatient  Remains inpatient appropriate because:Hemodynamically unstable, Unsafe d/c plan and IV treatments appropriate due to intensity of illness or inability to take PO   Dispo: The patient is from: Home              Anticipated d/c is to: SNF              Anticipated d/c date is: 3 days              Patient currently is not medically stable to d/c.   Difficult to place patient Yes      DVT Prophylaxis  :   Heparin GTT>> lovenox  Lab Results  Component Value Date   PLT 214 11/14/2020    Diet :  Diet Order            DIET DYS 3 Room service appropriate? Yes with Assist; Fluid consistency: Thin  Diet effective now                  Inpatient Medications  Scheduled Meds: . Chlorhexidine Gluconate Cloth  6 each Topical Daily  . collagenase   Topical Daily  . enoxaparin (LOVENOX) injection  90 mg Subcutaneous Q12H  . feeding supplement  237 mL Oral BID BM  . metoprolol tartrate  25 mg Oral BID  . neomycin-bacitracin-polymyxin   Topical BID  . pantoprazole (PROTONIX) IV  40 mg Intravenous Q24H  . sodium  chloride flush  10-40 mL Intracatheter Q12H   Continuous Infusions: . cefTRIAXone (ROCEPHIN)  IV 1 g (11/14/20 0836)  . diltiazem (CARDIZEM) infusion Stopped (11/13/20 1004)   PRN  Meds:.acetaminophen, albuterol, haloperidol lactate, LORazepam, sodium chloride flush  Antibiotics  :    Anti-infectives (From admission, onward)   Start     Dose/Rate Route Frequency Ordered Stop   11/11/20 1000  remdesivir 100 mg in sodium chloride 0.9 % 100 mL IVPB  Status:  Discontinued       "Followed by" Linked Group Details   100 mg 200 mL/hr over 30 Minutes Intravenous Daily 11/10/20 0101 11/10/20 0103   11/11/20 0800  cefTRIAXone (ROCEPHIN) 1 g in sodium chloride 0.9 % 100 mL IVPB        1 g 200 mL/hr over 30 Minutes Intravenous Every 24 hours 11/11/20 0732     11/10/20 1600  remdesivir 100 mg in sodium chloride 0.9 % 100 mL IVPB        100 mg 200 mL/hr over 30 Minutes Intravenous Daily 11/09/20 2346 11/13/20 0910   11/10/20 0000  remdesivir 200 mg in sodium chloride 0.9% 250 mL IVPB        200 mg 580 mL/hr over 30 Minutes Intravenous Once 11/09/20 2346 11/10/20 0441   11/09/20 2345  remdesivir 200 mg in sodium chloride 0.9% 250 mL IVPB  Status:  Discontinued       "Followed by" Linked Group Details   200 mg 580 mL/hr over 30 Minutes Intravenous Once 11/10/20 0101 11/10/20 0103       General Wearing M.D on 11/14/2020 at 12:30 PM  To page go to [email or web address]  [facility] -  Office  [phone]      Objective:   Vitals:   11/14/20 0025 11/14/20 0403 11/14/20 0736 11/14/20 1212  BP: 117/83 110/88 99/68 111/71  Pulse: 87 86 84 82  Resp: 20 16 14 15   Temp: 98.5 F (36.9 C) 97.9 F (36.6 C) (!) 97.3 F (36.3 C) 97.6 F (36.4 C)  TempSrc: Axillary Axillary Oral Oral  SpO2: 99% 98% 97% 97%  Weight:      Height:        Wt Readings from Last 3 Encounters:  11/09/20 98.7 kg  09/08/20 98.7 kg  06/08/20 93.9 kg     Intake/Output Summary (Last 24 hours) at 11/14/2020 1230 Last data filed at 11/14/2020 1104 Gross per 24 hour  Intake 290 ml  Output 2625 ml  Net -2335 ml     Physical Exam  Awake Alert, Oriented X 2, coherent and appropriate,  answering most questions appropriately, but he remains easily distracted with some impaired cognition and insight., No new F.N deficits, Normal affect Symmetrical Chest wall movement, Good air movement bilaterally, CTAB RRR,No Gallops,Rubs or new Murmurs, No Parasternal Heave +ve B.Sounds, Abd Soft, No tenderness, No rebound - guarding or rigidity. No Cyanosis, or edema, right lower extremity weakness, right knee bruising, right gluteal unstageable pressure ulcer, wound at glans penis has improved..     Data Review:    CBC Recent Labs  Lab 11/10/20 0804 11/11/20 0035 11/12/20 0345 11/13/20 0304 11/14/20 0310  WBC 9.4 9.1 8.6 6.8 9.4  HGB 14.9 12.8* 11.7* 11.9* 12.3*  HCT 50.7 42.8 36.7* 35.9* 37.0*  PLT 318 252 208 191 214  MCV 92.5 91.6 89.5 86.5 85.1  MCH 27.2 27.4 28.5 28.7 28.3  MCHC 29.4* 29.9* 31.9 33.1 33.2  RDW 16.8* 16.6* 16.7* 15.9* 15.9*  LYMPHSABS 0.6* 0.5* 0.7  0.8 1.1  MONOABS 0.5 0.4 0.6 0.4 0.5  EOSABS 0.0 0.0 0.0 0.1 0.2  BASOSABS 0.0 0.0 0.0 0.0 0.0    Recent Labs  Lab 11/09/20 2120 11/09/20 2242 11/10/20 0051 11/10/20 0804 11/10/20 1508 11/10/20 2000 11/11/20 0035 11/11/20 1500 11/11/20 2202 11/12/20 0345 11/12/20 1500 11/13/20 0304 11/14/20 0310  NA  --   --   --  166* 167*   < > 163*   < > 154* 150* 151* 150* 148*  K  --   --   --  4.6 4.1   < > 3.8   < > 5.6* 3.5 3.7 3.7 3.9  CL  --   --   --  >130* >130*   < > >130*   < > 129* 124* 122* 125* 119*  CO2  --   --   --  20* 20*   < > 17*   < > 14* 17* 18* 19* 21*  GLUCOSE  --   --   --  184* 152*   < > 212*   < > 160* 176* 133* 143* 122*  BUN  --   --   --  89* 85*   < > 86*   < > 71* 62* 53* 49* 37*  CREATININE  --   --   --  2.65* 2.58*   < > 2.46*   < > 2.45* 2.20* 1.90* 1.68* 1.41*  CALCIUM  --   --   --  8.8* 8.5*   < > 8.6*   < > 8.0* 8.1* 8.2* 8.3* 8.3*  AST  --   --   --  21  --   --  24  --   --  27  --  32 38  ALT  --   --   --  25  --   --  24  --   --  26  --  41 47*  ALKPHOS  --    --   --  62  --   --  56  --   --  56  --  64 71  BILITOT  --   --   --  1.4*  --   --  1.0  --   --  0.6  --  0.7 0.7  ALBUMIN  --   --   --  2.5* 2.6*  --  2.3*  --   --  2.2*  --  2.1* 2.0*  MG  --   --   --   --   --   --   --   --   --   --   --   --  2.2  CRP  --   --   --  2.7*  --   --  2.8*  --   --  2.0*  --  3.5*  --   LATICACIDVEN 2.4*  --  3.2*  --   --   --   --   --   --   --   --   --   --   INR  --  1.4*  --   --   --   --   --   --   --   --   --   --   --   TSH  --   --   --   --   --   --   --   --   --   --   --   --  2.627  AMMONIA  --   --   --   --   --   --   --   --   --   --   --   --  17   < > = values in this interval not displayed.    ------------------------------------------------------------------------------------------------------------------ No results for input(s): CHOL, HDL, LDLCALC, TRIG, CHOLHDL, LDLDIRECT in the last 72 hours.  Lab Results  Component Value Date   HGBA1C 5.3 06/08/2020   ------------------------------------------------------------------------------------------------------------------ Recent Labs    11/14/20 0310  TSH 2.627    Cardiac Enzymes No results for input(s): CKMB, TROPONINI, MYOGLOBIN in the last 168 hours.  Invalid input(s): CK ------------------------------------------------------------------------------------------------------------------    Component Value Date/Time   BNP 1,922.3 (H) 08/04/2017 2132    Micro Results Recent Results (from the past 240 hour(s))  Resp Panel by RT-PCR (Flu A&B, Covid) Nasopharyngeal Swab     Status: Abnormal   Collection Time: 11/09/20  9:13 PM   Specimen: Nasopharyngeal Swab; Nasopharyngeal(NP) swabs in vial transport medium  Result Value Ref Range Status   SARS Coronavirus 2 by RT PCR POSITIVE (A) NEGATIVE Final    Comment: RESULT CALLED TO, READ BACK BY AND VERIFIED WITH: Minus Breeding RN 11/09/20 2227 JDW (NOTE) SARS-CoV-2 target nucleic acids are DETECTED.  The SARS-CoV-2  RNA is generally detectable in upper respiratory specimens during the acute phase of infection. Positive results are indicative of the presence of the identified virus, but do not rule out bacterial infection or co-infection with other pathogens not detected by the test. Clinical correlation with patient history and other diagnostic information is necessary to determine patient infection status. The expected result is Negative.  Fact Sheet for Patients: BloggerCourse.com  Fact Sheet for Healthcare Providers: SeriousBroker.it  This test is not yet approved or cleared by the [place] FDA and  has been authorized for detection and/or diagnosis of SARS-CoV-2 by FDA under an Emergency Use Authorization (EUA).  This EUA will remain in effect (meaning this test can be used)  for the duration of  the COVID-19 declaration under Section 564(b)(1) of the Act, 21 U.S.C. section 360bbb-3(b)(1), unless the authorization is terminated or revoked sooner.     Influenza A by PCR NEGATIVE NEGATIVE Final   Influenza B by PCR NEGATIVE NEGATIVE Final    Comment: (NOTE) The Xpert Xpress SARS-CoV-2/FLU/RSV plus assay is intended as an aid in the diagnosis of influenza from Nasopharyngeal swab specimens and should not be used as a sole basis for treatment. Nasal washings and aspirates are unacceptable for Xpert Xpress SARS-CoV-2/FLU/RSV testing.  Fact Sheet for Patients: BloggerCourse.com  Fact Sheet for Healthcare Providers: SeriousBroker.it  This test is not yet approved or cleared by the [place] FDA and has been authorized for detection and/or diagnosis of SARS-CoV-2 by FDA under an Emergency Use Authorization (EUA). This EUA will remain in effect (meaning this test can be used) for the duration of the COVID-19 declaration under Section 564(b)(1) of the Act, 21 U.S.C. section  360bbb-3(b)(1), unless the authorization is terminated or revoked.  Performed at [facility], [address], [place], [place] [address]   Culture, Urine     Status: Abnormal   Collection Time: 11/11/20  7:34 AM   Specimen: Urine, Random  Result Value Ref Range Status   Specimen Description URINE, RANDOM  Final   Special Requests   Final    NONE Performed at [facility], [address], [place], [place] [address]  Culture MULTIPLE SPECIES PRESENT, SUGGEST RECOLLECTION (A)  Final   Report Status 11/12/2020 FINAL  Final    Radiology Reports EEG  Result Date: 11/12/2020 Charlsie Quest, MD     11/12/2020 10:37 AM Patient Name: Kristi Hyer MRN: [id number] Epilepsy Attending: Charlsie Quest Referring Physician/Provider: Dr Caryl Pina Date: 11/12/2020 Duration: 23.19 mins Patient history: 70 yo M with ams. EEG to evaluate for seizure Level of alertness: Awake, drowsy, sleep, comatose, lethargic AEDs during EEG study: None Technical aspects: This EEG study was done with scalp electrodes positioned according to the 10-20 International system of electrode placement. Electrical activity was acquired at a sampling rate of 500Hz  and reviewed with a high frequency filter of 70Hz  and a low frequency filter of 1Hz . EEG data were recorded continuously and digitally stored. Description: The posterior dominant rhythm consists of 8-9 Hz activity of moderate voltage (25-35 uV) seen predominantly in posterior head regions, symmetric and reactive to eye opening and eye closing. Hyperventilation and photic stimulation were not performed.   IMPRESSION: This study is within normal limits. No seizures or epileptiform discharges were seen throughout the recording. Charlsie Quest   CT Code Stroke CTA Head W/WO contrast  Result Date: 11/09/2020 CLINICAL DATA:  Initial evaluation for acute stroke, left-sided weakness. EXAM: CT ANGIOGRAPHY HEAD AND NECK CT PERFUSION BRAIN TECHNIQUE: Multidetector CT  imaging of the head and neck was performed using the standard protocol during bolus administration of intravenous contrast. Multiplanar CT image reconstructions and MIPs were obtained to evaluate the vascular anatomy. Carotid stenosis measurements (when applicable) are obtained utilizing NASCET criteria, using the distal internal carotid diameter as the denominator. Multiphase CT imaging of the brain was performed following IV bolus contrast injection. Subsequent parametric perfusion maps were calculated using RAPID software. CONTRAST:  100 cc of Omnipaque 350. COMPARISON:  Prior head CT from earlier the same day. FINDINGS: CTA NECK FINDINGS Aortic arch: Visualized aortic arch of normal caliber with normal branch pattern. No hemodynamically significant stenosis about the origin of the great vessels. Visualized subclavian arteries widely patent. Right carotid system: Right common and internal carotid arteries widely patent without stenosis, dissection or occlusion. Left carotid system: Left CCA patent from its origin to the bifurcation without stenosis. Mild eccentric soft plaque at the origin of the left ICA without significant stenosis. Left ICA patent distally without stenosis, dissection or occlusion. Vertebral arteries: Both vertebral arteries arise from the subclavian arteries. Dominant left vertebral artery with a diffusely hypoplastic right vertebral artery. Left vertebral artery widely patent within the neck without stenosis or other abnormality. Severe stenosis at the origin of the right vertebral artery. Hypoplastic right vertebral artery otherwise patent within the neck without stenosis or other abnormality. Skeleton: No visible acute osseous abnormality, although cervical spine better evaluated on concomitant CT of the cervical spine. No discrete or worrisome osseous lesions. Other neck: No other acute soft tissue abnormality within the neck. No mass or adenopathy. Upper chest: Scattered atelectatic  changes noted within the visualized lungs. Few scattered superimposed tree-in-bud nodular densities within the peripheral right upper lobe could reflect changes of acute bronchiolitis and/or mucoid impaction. No frank airspace consolidation. Review of the MIP images confirms the above findings CTA HEAD FINDINGS Anterior circulation: Petrous, cavernous, and supraclinoid segments of both internal carotid arteries are widely patent without stenosis. A1 segments patent bilaterally. Normal anterior communicating artery. 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 Finding presumably chronic in nature given the chronic bilateral ACA territory infarcts seen on prior  noncontrast head CT. Scant distal reconstitution, likely collateral. M1 segments patent bilaterally. Normal MCA bifurcations. Distal MCA branches well perfused and symmetric. Posterior circulation: Dominant left vertebral artery widely patent to the vertebrobasilar junction. Left PICA patent. Hypoplastic right vertebral artery grossly patent to the vertebrobasilar junction as well. Right PICA not visualized. Basilar patent to its distal aspect without stenosis. Superior cerebellar arteries patent bilaterally. Fetal type origin of the left PCA. Right PCA supplied via the basilar as well as a robust right posterior communicating artery. Scattered atheromatous irregularity within both PCAs without high-grade stenosis. Short-segment mild right P2 stenosis noted (series 10, image 19). Venous sinuses: Not well assessed due to timing of the contrast bolus. Anatomic variants: Fetal type origin of the left PCA. Hypoplastic right vertebral artery. 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 Review of the MIP images confirms the above findings CT Brain Perfusion Findings: ASPECTS: 10 CBF (<30%) Volume: 0mL Perfusion (Tmax>6.0s) volume: 18mL Mismatch Volume: 18mL Infarction Location:Negative CT perfusion for acute ischemia. Delayed perfusion seen within the right ACA distribution, in  keeping with the chronic ACA occlusion. IMPRESSION: CTA HEAD AND NECK IMPRESSION: 1. Negative CTA for emergent large vessel occlusion. 2. 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 3. Scattered mild for age atheromatous change elsewhere about the major arterial vasculature of the head and neck. No other hemodynamically significant or correctable stenosis. 4. Few scattered tree-in-bud nodular densities within right upper lobe, nonspecific, but could reflect changes of acute bronchiolitis and/or mucoid impaction. No frank airspace consolidation. CT PERFUSION IMPRESSION: 1. Negative CT perfusion for acute ischemia. 2. 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 Critical Value/emergent results were called by telephone at the time of interpretation on 11/09/2020 at 9:45 pm to provider [facility] , who verbally acknowledged these results. Electronically Signed   By: Rise Mu M.D.   On: 11/09/2020 22:11   DG Pelvis 1-2 Views  Result Date: 11/09/2020 CLINICAL DATA:  Fall EXAM: PELVIS - 1-2 VIEW COMPARISON:  CT 03/22/2014 FINDINGS: Bones of the pelvis appear intact and congruent. Proximal femora intact and normally located within the acetabula. Moderate degenerative changes noted throughout the lower lumbar spine, bilateral SI joints, and bilateral hips. Some mild left hip soft tissue swelling is present. Finding on a background of diffuse mild body wall edema. Some hyperdense likely excreted contrast media seen within the urinary bladder. Slightly lobular, crenulated bladder contours and possible diverticular outpouchings, can be seen in the setting chronic outlet obstruction particularly in this patient with a history of prostatomegaly seen on comparison pelvic CT. No other acute or suspicious soft tissue abnormalities. IMPRESSION: 1. Mild  left hip soft tissue swelling without acute fracture or traumatic malalignment. 2. Moderate degenerative changes throughout the spine, bilateral SI joints and hips. 3. Bladder opacified by excreted contrast media. Slightly lobular, crenulated bladder contours and possible diverticular outpouchings, can be seen in the setting of chronic outlet obstruction particularly in this patient with a history of prostatomegaly. Electronically Signed   By: Kreg Shropshire M.D.   On: 11/09/2020 22:32   DG Knee 1-2 Views Right  Result Date: 11/10/2020 CLINICAL DATA:  Found down, tender to palpation EXAM: RIGHT KNEE - 1-2 VIEW COMPARISON:  None. FINDINGS: Frontal and lateral views of the right knee demonstrate 3 compartmental osteoarthritis most severe in the medial compartment. No acute fracture, subluxation, or dislocation. No joint effusion. IMPRESSION: 1. Three compartmental osteoarthritis.  No acute fracture. Electronically Signed   By: Sharlet Salina M.D.   On: 11/10/2020 19:12   CT Code Stroke CTA Neck W/WO contrast  Result Date: 11/09/2020 CLINICAL DATA:  Initial evaluation for acute stroke, left-sided weakness. EXAM: CT ANGIOGRAPHY HEAD AND NECK CT PERFUSION BRAIN TECHNIQUE: Multidetector CT imaging of the head and neck was performed using the standard protocol during bolus administration of intravenous contrast. Multiplanar CT image reconstructions and MIPs were obtained to evaluate the vascular anatomy. Carotid stenosis measurements (when applicable) are obtained utilizing NASCET criteria, using the distal internal carotid diameter as the denominator. Multiphase CT imaging of the brain was performed following IV bolus contrast injection. Subsequent parametric perfusion maps were calculated using RAPID software. CONTRAST:  100 cc of Omnipaque 350. COMPARISON:  Prior head CT from earlier the same day. FINDINGS: CTA NECK FINDINGS Aortic arch: Visualized aortic arch of normal caliber with normal branch pattern. No  hemodynamically significant stenosis about the origin of the great vessels. Visualized subclavian arteries widely patent. Right carotid system: Right common and internal carotid arteries widely patent without stenosis, dissection or occlusion. Left carotid system: Left CCA patent from its origin to the bifurcation without stenosis. Mild eccentric soft plaque at the origin of the left ICA without significant stenosis. Left ICA patent distally without stenosis, dissection or occlusion. Vertebral arteries: Both vertebral arteries arise from the subclavian arteries. Dominant left vertebral artery with a diffusely hypoplastic right vertebral artery. Left vertebral artery widely patent within the neck without stenosis or other abnormality. Severe stenosis at the origin of the right vertebral artery. Hypoplastic right vertebral artery otherwise patent within the neck without stenosis or other abnormality. Skeleton: No visible acute osseous abnormality, although cervical spine better evaluated on concomitant CT of the cervical spine. No discrete or worrisome osseous lesions. Other neck: No other acute soft tissue abnormality within the neck. No mass or adenopathy. Upper chest: Scattered atelectatic changes noted within the visualized lungs. Few scattered superimposed tree-in-bud nodular densities within the peripheral right upper lobe could reflect changes of acute bronchiolitis and/or mucoid impaction. No frank airspace consolidation. Review of the MIP images confirms the above findings CTA HEAD FINDINGS Anterior circulation: Petrous, cavernous, and supraclinoid segments of both internal carotid arteries are widely patent without stenosis. A1 segments patent bilaterally. Normal anterior communicating artery. 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 Finding presumably chronic in nature given the chronic bilateral ACA territory infarcts seen on prior noncontrast head CT. Scant distal reconstitution, likely  collateral. M1 segments patent bilaterally. Normal MCA bifurcations. Distal MCA branches well perfused and symmetric. Posterior circulation: Dominant left vertebral artery widely patent to the vertebrobasilar junction. Left PICA patent. Hypoplastic right vertebral artery grossly patent to the vertebrobasilar junction as well. Right PICA not visualized. Basilar patent to its distal aspect without stenosis. Superior cerebellar arteries patent bilaterally. Fetal type origin of the left PCA. Right PCA supplied via the basilar as well as a robust right posterior communicating artery. Scattered atheromatous irregularity within both PCAs without high-grade stenosis. Short-segment mild right P2 stenosis noted (series 10, image 19). Venous sinuses: Not well assessed due to timing of the contrast bolus. Anatomic variants: Fetal type origin of the left PCA. Hypoplastic right vertebral artery. 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 Review of the MIP images confirms the above findings CT Brain Perfusion Findings: ASPECTS: 10 CBF (<30%) Volume: 0mL Perfusion (Tmax>6.0s) volume: 18mL Mismatch Volume: 18mL Infarction Location:Negative CT perfusion for acute ischemia. Delayed perfusion seen within the right ACA distribution, in keeping with the  chronic ACA occlusion. IMPRESSION: CTA HEAD AND NECK IMPRESSION: 1. Negative CTA for emergent large vessel occlusion. 2. 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 3. Scattered mild for age atheromatous change elsewhere about the major arterial vasculature of the head and neck. No other hemodynamically significant or correctable stenosis. 4. Few scattered tree-in-bud nodular densities within right upper lobe, nonspecific, but could reflect changes of acute bronchiolitis and/or mucoid impaction. No frank airspace consolidation. CT PERFUSION IMPRESSION: 1. Negative CT perfusion for acute ischemia. 2. Delayed perfusion within the right ACA  distribution, in keeping with the chronic azygos ACA occlusion and chronic ACA territory infarcts. Critical Value/emergent results were called by telephone at the time of interpretation on 11/09/2020 at 9:45 pm to provider [facility] , who verbally acknowledged these results. Electronically Signed   By: Rise Mu M.D.   On: 11/09/2020 22:11   MR BRAIN WO CONTRAST  Result Date: 11/10/2020 CLINICAL DATA:  Stroke follow-up. EXAM: MRI HEAD WITHOUT CONTRAST TECHNIQUE: Multiplanar, multiecho pulse sequences of the brain and surrounding structures were obtained without intravenous contrast. COMPARISON:  Head CT November 09, 2020. FINDINGS: Incomplete study due to patient inability to lie still in the scanner for the duration the study. Chronic bilateral ACA territory infarct. T2 hyperintensity within the periventricular white matter, nonspecific, most likely related to chronic small vessel ischemia. Brain: No acute infarction, hemorrhage, hydrocephalus, extra-axial collection or mass lesion. Vascular: Normal flow voids at the skull base. Sinuses/Orbits: Opacification of the bilateral frontal sinuses. The orbits are grossly unremarkable. IMPRESSION: 1. Incomplete study . 2. No acute intracranial abnormality identified. 3. Bilateral frontal sinus disease. Electronically Signed   By: Baldemar Lenis M.D.   On: 11/10/2020 18:00   US RENAL  Result Date: 11/10/2020 CLINICAL DATA:  Rule out hydronephrosis EXAM: RENAL / URINARY TRACT ULTRASOUND COMPLETE COMPARISON:  None. FINDINGS: Right Kidney: Renal measurements: 8.7 x 5.1 x 4.7 cm = volume: 109 mL. Echogenicity within normal limits. No mass or hydronephrosis visualized. Left Kidney: Renal measurements: 10.0 x 4.7 x 4.6 cm = volume: 112 mL. Echogenicity within normal limits. No mass or hydronephrosis visualized. Bladder: Decompressed with Foley catheter in place. Other: None. IMPRESSION: No acute findings.  No hydronephrosis. Electronically Signed    By: Charlett Nose M.D.   On: 11/10/2020 21:40   CT C-SPINE NO CHARGE  Result Date: 11/09/2020 CLINICAL DATA:  Left-sided weakness EXAM: CT CERVICAL SPINE WITHOUT CONTRAST TECHNIQUE: Multidetector CT imaging of the cervical spine was performed without intravenous contrast. Multiplanar CT image reconstructions were also generated. COMPARISON:  None. FINDINGS: Alignment: Alignment is anatomic. Skull base and vertebrae: No acute fracture. No primary bone lesion or focal pathologic process. Soft tissues and spinal canal: Please refer to separately reported CT angiography of the neck for description of vascular findings. No prevertebral fluid or swelling. No visible canal hematoma. Disc levels: Bridging anterior osteophytes are seen throughout the entire cervical spine. Mild disc osteophyte complex at C4-5. No significant central canal or neural foraminal encroachment. Upper chest: Airway is patent. Lung apices are clear. Other: Reconstructed images confirm the above findings. IMPRESSION: 1. No acute cervical spine fracture. 2. Diffuse cervical spondylosis. No significant neural foraminal or central canal stenosis. Electronically Signed   By: Sharlet Salina M.D.   On: 11/09/2020 22:02   CT Code Stroke Cerebral Perfusion with contrast  Result Date: 11/09/2020 CLINICAL DATA:  Initial evaluation for acute stroke, left-sided weakness. EXAM: CT ANGIOGRAPHY HEAD AND NECK  CT PERFUSION BRAIN TECHNIQUE: Multidetector CT imaging of the head and neck was performed using the standard protocol during bolus administration of intravenous contrast. Multiplanar CT image reconstructions and MIPs were obtained to evaluate the vascular anatomy. Carotid stenosis measurements (when applicable) are obtained utilizing NASCET criteria, using the distal internal carotid diameter as the denominator. Multiphase CT imaging of the brain was performed following IV bolus contrast injection. Subsequent parametric perfusion maps were calculated using  RAPID software. CONTRAST:  100 cc of Omnipaque 350. COMPARISON:  Prior head CT from earlier the same day. FINDINGS: CTA NECK FINDINGS Aortic arch: Visualized aortic arch of normal caliber with normal branch pattern. No hemodynamically significant stenosis about the origin of the great vessels. Visualized subclavian arteries widely patent. Right carotid system: Right common and internal carotid arteries widely patent without stenosis, dissection or occlusion. Left carotid system: Left CCA patent from its origin to the bifurcation without stenosis. Mild eccentric soft plaque at the origin of the left ICA without significant stenosis. Left ICA patent distally without stenosis, dissection or occlusion. Vertebral arteries: Both vertebral arteries arise from the subclavian arteries. Dominant left vertebral artery with a diffusely hypoplastic right vertebral artery. Left vertebral artery widely patent within the neck without stenosis or other abnormality. Severe stenosis at the origin of the right vertebral artery. Hypoplastic right vertebral artery otherwise patent within the neck without stenosis or other abnormality. Skeleton: No visible acute osseous abnormality, although cervical spine better evaluated on concomitant CT of the cervical spine. No discrete or worrisome osseous lesions. Other neck: No other acute soft tissue abnormality within the neck. No mass or adenopathy. Upper chest: Scattered atelectatic changes noted within the visualized lungs. Few scattered superimposed tree-in-bud nodular densities within the peripheral right upper lobe could reflect changes of acute bronchiolitis and/or mucoid impaction. No frank airspace consolidation. Review of the MIP images confirms the above findings CTA HEAD FINDINGS Anterior circulation: Petrous, cavernous, and supraclinoid segments of both internal carotid arteries are widely patent without stenosis. A1 segments patent bilaterally. Normal anterior communicating artery.  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 Finding presumably chronic in nature given the chronic bilateral ACA territory infarcts seen on prior noncontrast head CT. Scant distal reconstitution, likely collateral. M1 segments patent bilaterally. Normal MCA bifurcations. Distal MCA branches well perfused and symmetric. Posterior circulation: Dominant left vertebral artery widely patent to the vertebrobasilar junction. Left PICA patent. Hypoplastic right vertebral artery grossly patent to the vertebrobasilar junction as well. Right PICA not visualized. Basilar patent to its distal aspect without stenosis. Superior cerebellar arteries patent bilaterally. Fetal type origin of the left PCA. Right PCA supplied via the basilar as well as a robust right posterior communicating artery. Scattered atheromatous irregularity within both PCAs without high-grade stenosis. Short-segment mild right P2 stenosis noted (series 10, image 19). Venous sinuses: Not well assessed due to timing of the contrast bolus. Anatomic variants: Fetal type origin of the left PCA. Hypoplastic right vertebral artery. 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 Review of the MIP images confirms the above findings CT Brain Perfusion Findings: ASPECTS: 10 CBF (<30%) Volume: 0mL Perfusion (Tmax>6.0s) volume: 18mL Mismatch Volume: 18mL Infarction Location:Negative CT perfusion for acute ischemia. Delayed perfusion seen within the right ACA distribution, in keeping with the chronic ACA occlusion. IMPRESSION: CTA HEAD AND NECK IMPRESSION: 1. Negative CTA for emergent large vessel occlusion. 2. 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 3. Scattered mild for age atheromatous change elsewhere about the major arterial vasculature  of the head and neck. No other hemodynamically significant or correctable stenosis. 4. Few scattered tree-in-bud nodular densities within right upper lobe, nonspecific, but  could reflect changes of acute bronchiolitis and/or mucoid impaction. No frank airspace consolidation. CT PERFUSION IMPRESSION: 1. Negative CT perfusion for acute ischemia. 2. 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 Critical Value/emergent results were called by telephone at the time of interpretation on 11/09/2020 at 9:45 pm to provider [facility] , who verbally acknowledged these results. Electronically Signed   By: Rise Mu M.D.   On: 11/09/2020 22:11   DG CHEST PORT 1 VIEW  Result Date: 11/11/2020 CLINICAL DATA:  PICC line placement EXAM: PORTABLE CHEST 1 VIEW COMPARISON:  11/09/2020 FINDINGS: Left upper extremity PICC line is been placed with its tip within the superior vena cava. Minimal left basilar atelectasis. Lungs are otherwise clear. No pneumothorax or pleural effusion. Cardiac size within normal limits. Pulmonary vascularity is normal. No acute bone abnormality. IMPRESSION: Left upper extremity PICC line tip within the superior vena cava. Electronically Signed   By: Helyn Numbers MD   On: 11/11/2020 13:12   DG Chest Port 1 View  Result Date: 11/09/2020 CLINICAL DATA:  Fall EXAM: PORTABLE CHEST 1 VIEW COMPARISON:  None. FINDINGS: The heart size and mediastinal contours are within normal limits. Both lungs are clear. The visualized skeletal structures are unremarkable. IMPRESSION: No active disease. Electronically Signed   By: Jonna Clark M.D.   On: 11/09/2020 22:38   DG Swallowing Func-Speech Pathology  Result Date: 11/11/2020 Objective Swallowing Evaluation: Type of Study: MBS-Modified Barium Swallow Study  Patient Details Name: Jaysion Ramseyer MRN: [id number] Date of Birth: 03/04/1951 Today's Date: 11/11/2020 Time: SLP Start Time (ACUTE ONLY): 1437 -SLP Stop Time (ACUTE ONLY): 1455 SLP Time Calculation (min) (ACUTE ONLY): 18 min Past Medical History: Past Medical History: Diagnosis Date .  Chronic systolic CHF (congestive heart failure) (HCC)   EF 35-40, diffuse HK, mild MR, moderate LAE, mild RAE, PASP 44, L pleural eff . Dilated cardiomyopathy (HCC)   likely related to tachycardia . Persistent atrial fibrillation [facility])  Past Surgical History: Past Surgical History: Procedure Laterality Date . CARDIOVERSION N/A 08/20/2017  Procedure: CARDIOVERSION;  Surgeon: [name], MD;  Location: [facility] ENDOSCOPY;  Service: Cardiovascular;  Laterality: N/A; . INCISION AND DRAINAGE ABSCESS Left 03/22/2014  Procedure: INCISION AND DRAINAGE ABSCESS LEFT BUTTOCK ABSCESS;  Surgeon: [name], MD;  Location: [facility] OR;  Service: General;  Laterality: Left; HPI: Pt is a 70 y.o. male with medical history significant for atrial fibrillation on Eliquis-last dose unknown, dilated cardiomyopathy, chronic systolic CHF, CKD 3 who presented after being found on the floor at home by his friends who had last seen him one week prior. EMT noticed increased weakness on the left and he was brought in as a code stroke; this was ultimately cancelled by neurology. MRI was negative for acute changes. Pt found to have COVID-19. CXR 2/8 was negative.  No data recorded Assessment / Plan / Recommendation CHL IP CLINICAL IMPRESSIONS 11/11/2020 Clinical Impression Cervical osteophytes were noted, but did not significantly impact swallow function. Pt demonstrated decreased bolus cohesion, a pharyngeal delay, and incomplete epiglottic inverversion with the initial bolus of thin liquids via cup which was administered by the SLP. Premature spillage was noted to the pyriform sinuses and aspiration (PAS 7) noted thereafter secondary to the pharyngeal delay and incomplete epiglottic inversion. Coughing was noted and  effective in expelling some of the aspirated material. Subsequent swallows were significant for prolonged mastication, and mildly reduced lingual retraction, but his swallow was notably more timely with self-feeding and no subsequent  instances of penetration or aspiration were noted even when challenged with large sips and consecutive swallows. Mild to moderate vallecular residue was demonstrated with solids and was reduced to a functional level with a liquid wash. A dysphagia 3 diet with thin liquids is recommended at this time. Pt's swallow function and associated aspiration risk are much improved with self-feeding and it is recommended that this be encouraged with supervision. SLP will follow to ensure diet tolerance. SLP Visit Diagnosis Dysphagia, unspecified (R13.10) Attention and concentration deficit following -- Frontal lobe and executive function deficit following -- Impact on safety and function Moderate aspiration risk;Mild aspiration risk   CHL IP TREATMENT RECOMMENDATION 11/11/2020 Treatment Recommendations Therapy as outlined in treatment plan below   Prognosis 11/11/2020 Prognosis for Safe Diet Advancement Good Barriers to Reach Goals Cognitive deficits Barriers/Prognosis Comment -- CHL IP DIET RECOMMENDATION 11/11/2020 SLP Diet Recommendations Dysphagia 3 (Mech soft) solids;Thin liquid Liquid Administration via Cup;Straw Medication Administration Whole meds with liquid Compensations Slow rate;Small sips/bites Postural Changes Remain semi-upright after after feeds/meals (Comment);Seated upright at 90 degrees   CHL IP OTHER RECOMMENDATIONS 11/11/2020 Recommended Consults -- Oral Care Recommendations Oral care BID Other Recommendations --   CHL IP FOLLOW UP RECOMMENDATIONS 11/11/2020 Follow up Recommendations (No Data)   CHL IP FREQUENCY AND DURATION 11/11/2020 Speech Therapy Frequency (ACUTE ONLY) min 2x/week Treatment Duration 2 weeks      CHL IP ORAL PHASE 11/11/2020 Oral Phase Impaired Oral - Pudding Teaspoon -- Oral - Pudding Cup -- Oral - Honey Teaspoon -- Oral - Honey Cup -- Oral - Nectar Teaspoon -- Oral - Nectar Cup WFL Oral - Nectar Straw WFL Oral - Thin Teaspoon -- Oral - Thin Cup Decreased bolus cohesion;Premature spillage Oral  - Thin Straw WFL Oral - Puree WFL Oral - Mech Soft WFL Oral - Regular WFL Oral - Multi-Consistency -- Oral - Pill WFL Oral Phase - Comment --  CHL IP PHARYNGEAL PHASE 11/11/2020 Pharyngeal Phase Impaired Pharyngeal- Pudding Teaspoon -- Pharyngeal -- Pharyngeal- Pudding Cup -- Pharyngeal -- Pharyngeal- Honey Teaspoon -- Pharyngeal -- Pharyngeal- Honey Cup -- Pharyngeal -- Pharyngeal- Nectar Teaspoon -- Pharyngeal -- Pharyngeal- Nectar Cup Reduced tongue base retraction Pharyngeal -- Pharyngeal- Nectar Straw WFL Pharyngeal -- Pharyngeal- Thin Teaspoon -- Pharyngeal -- Pharyngeal- Thin Cup Reduced tongue base retraction;Delayed swallow initiation-pyriform sinuses;Pharyngeal residue - valleculae;Pharyngeal residue - pyriform;Penetration/Aspiration during swallow;Penetration/Apiration after swallow;Moderate aspiration Pharyngeal Material enters airway, passes BELOW cords and not ejected out despite cough attempt by patient Pharyngeal- Thin Straw -- Pharyngeal -- Pharyngeal- Puree -- Pharyngeal -- Pharyngeal- Mechanical Soft -- Pharyngeal -- Pharyngeal- Regular -- Pharyngeal -- Pharyngeal- Multi-consistency -- Pharyngeal -- Pharyngeal- Pill -- Pharyngeal -- Pharyngeal Comment --  No flowsheet data found. Scheryl Marten 11/11/2020, 3:52 PM              DG HIP UNILAT WITH PELVIS 2-3 VIEWS RIGHT  Result Date: 11/10/2020 CLINICAL DATA:  Found down, right hip tenderness EXAM: DG HIP (WITH OR WITHOUT PELVIS) 2-3V RIGHT COMPARISON:  None. FINDINGS: Frontal view of the pelvis including both hips as well as frontal and frogleg lateral views of the right hip are obtained. No fracture, subluxation, or dislocation. Joint spaces are well preserved. Soft tissues are normal. IMPRESSION: 1. No acute displaced fracture. Electronically Signed   By: Maxwell Caul.D.  On: 11/10/2020 19:09   CT HEAD CODE STROKE WO CONTRAST  Result Date: 11/09/2020 CLINICAL DATA:  Code stroke. Initial evaluation for acute left-sided weakness,  stroke suspected. EXAM: CT HEAD WITHOUT CONTRAST TECHNIQUE: Contiguous axial images were obtained from the base of the skull through the vertex without intravenous contrast. COMPARISON:  None available. FINDINGS: Brain: Age-related cerebral atrophy with chronic small vessel ischemic disease. Remote lacunar infarct present at the left caudate. Remote bilateral ACA territory infarcts noted. No acute intracranial hemorrhage. No acute large vessel territory infarct. No mass lesion, mass effect or midline shift. Mild ventricular prominence related to global parenchymal volume loss without hydrocephalus. No extra-axial fluid collection. Vascular: No hyperdense vessel. Skull: Scalp soft tissues demonstrate no acute finding. Small lipoma noted at the posterior scalp. Calvarium intact. Sinuses/Orbits: Globes and orbital soft tissues within normal limits. Chronic frontal sinusitis noted. Scattered mucosal thickening noted elsewhere within the sphenoid ethmoidal sinuses. No mastoid effusion. Other: None. ASPECTS [facility]([place] Stroke Program Early CT Score) - Ganglionic level infarction (caudate, lentiform nuclei, internal capsule, insula, M1-M3 cortex): 7 - Supraganglionic infarction (M4-M6 cortex): 3 Total score (0-10 with 10 being normal): 10 IMPRESSION: 1. No acute intracranial infarct or other abnormality. 2. ASPECTS is 10. 3. Remote bilateral ACA territory infarcts, with additional remote lacunar infarct at the left basal ganglia. 4. Underlying atrophy with chronic small vessel ischemic disease. These results were communicated to Dr. Wilford CornerArora at 9:31 pmon 2/8/2022by text page via the [facility] messaging system. Electronically Signed   By: Rise MuBenjamin  McClintock M.D.   On: 11/09/2020 21:51   ECHOCARDIOGRAM LIMITED  Result Date: 11/10/2020    ECHOCARDIOGRAM LIMITED REPORT   Patient Name:   Susanne GreenhouseGARY Paulino  Date of Exam: 11/10/2020 Medical Rec #:  [id number]  Height:       72.0 in Accession #:    [id number] Weight:       217.6 lb Date of Birth:   19-Sep-1951 BSA:          2.208 m Patient Age:    69 years   BP:           138/94 mmHg Patient Gender: M          HR:           138 bpm. Exam Location:  Inpatient Procedure: Limited Echo, Cardiac Doppler and Color Doppler Indications:    Atrial fibrillation  History:        Patient has prior history of Echocardiogram examinations, most                 recent 07/19/2017. CHF; Arrythmias:Atrial Fibrillation. Dilated                 cardiomyopathy. CKD.  Sonographer:    Ross LudwigArthur Guy RDCS (AE) Referring Phys: [id number] [facility]  Sonographer Comments: Suboptimal parasternal window. IMPRESSIONS  1. Left ventricular ejection fraction, by estimation, is 60 to 65%. The left ventricle has normal function. The left ventricle has no regional wall motion abnormalities. There is moderate left ventricular hypertrophy. Left ventricular diastolic parameters are indeterminate.  2. Right ventricular systolic function is normal. The right ventricular size is normal.  3. Left atrial size was mildly dilated.  4. The mitral valve is normal in structure. Trivial mitral valve regurgitation. No evidence of mitral stenosis.  5. The aortic valve was not well visualized. Aortic valve regurgitation is not visualized. No aortic stenosis is present.  6. The inferior vena cava is dilated in size with >50% respiratory variability, suggesting  right atrial pressure of 8 mmHg. FINDINGS  Left Ventricle: Left ventricular ejection fraction, by estimation, is 60 to 65%. The left ventricle has normal function. The left ventricle has no regional wall motion abnormalities. The left ventricular internal cavity size was normal in size. There is  moderate left ventricular hypertrophy. Left ventricular diastolic parameters are indeterminate. Right Ventricle: The right ventricular size is normal. No increase in right ventricular wall thickness. Right ventricular systolic function is normal. Left Atrium: Left atrial size was mildly dilated. Right Atrium:  Right atrial size was normal in size. Pericardium: There is no evidence of pericardial effusion. Mitral Valve: The mitral valve is normal in structure. Trivial mitral valve regurgitation. No evidence of mitral valve stenosis. Tricuspid Valve: The tricuspid valve is normal in structure. Tricuspid valve regurgitation is mild . No evidence of tricuspid stenosis. Aortic Valve: The aortic valve was not well visualized. Aortic valve regurgitation is not visualized. No aortic stenosis is present. Pulmonic Valve: The pulmonic valve was normal in structure. Pulmonic valve regurgitation is not visualized. No evidence of pulmonic stenosis. Aorta: The aortic root is normal in size and structure. Venous: The inferior vena cava is dilated in size with greater than 50% respiratory variability, suggesting right atrial pressure of 8 mmHg. IAS/Shunts: No atrial level shunt detected by color flow Doppler. LEFT VENTRICLE PLAX 2D LVIDd:         4.80 cm LVIDs:         3.40 cm LV PW:         1.60 cm LV IVS:        1.50 cm LVOT diam:     2.10 cm LVOT Area:     3.46 cm  IVC IVC diam: 1.70 cm LEFT ATRIUM         Index LA diam:    3.90 cm 1.77 cm/m   AORTA Ao Root diam: 3.60 cm  SHUNTS Systemic Diam: 2.10 cm Charlton Haws MD Electronically signed by Charlton Haws MD Signature Date/Time: 11/10/2020/3:42:53 PM    Final    [place] EKG SITE RITE  Result Date: 11/11/2020 If Site Rite image not attached, placement could not be confirmed due to current cardiac rhythm.

## 2020-11-14 NOTE — Progress Notes (Signed)
[facility] Progress Note    Assessment/ Plan:    Hypovolemic hypernatremia: Initial sodium level of 168.Marland Kitchen  Fluids were discontinued yesterday the patient had a diet.  A.m. labs show sodium at 148, creatinine improved to 1.41.  Patient's sodium slowly improving and moving towards baseline. -Continue to monitor p.o. intake -We will check a.m. labs  AKI on CKD stage III: Baseline creatinine appears to be ~1.4. Creatinine on presentation of 3.10.  Creatinine improved 1.48.  BUN elevated but improved at 37.  Patient essentially fluid neutral since admission. Diuresed 2027 mL yesterday. Physical exam shows trace edema still present in bilateral lower extremities.  Creatinine is back to baseline. -Continue to monitor daily Cr, -Monitor Daily I/Os, Daily weight  -Avoid nephrotoxic medications  Hypertension: Patient with some elevated blood pressure since arrival.  Now with most recent blood pressure 120/75.   Hyperkalemia Potassium of 3.9 this morning.  Will monitor with a.m. labs.  Atrial fib with RVR: Currently on diltiazem drip.  Current heart rate of 86. Per primary.  Acute metabolic encephalopathy:  Patient speaking in full sentences now, oriented to person place and time.  Per pimary team/neurology.r  COVID-19 infection-Per primary  We will sign off at this time, feel free to reconsult if needed.  Subjective:   Patient reports he feels great and would like to go home.   Objective:   BP 110/88 (BP Location: Right Wrist)   Pulse 86   Temp 97.9 F (36.6 C) (Axillary)   Resp 16   Ht 6' (1.829 m)   Wt 98.7 kg   SpO2 98%   BMI 29.51 kg/m   Intake/Output Summary (Last 24 hours) at 11/14/2020 5784 Last data filed at 11/14/2020 6962 Gross per 24 hour  Intake 647.13 ml  Output 2025 ml  Net -1377.87 ml   Weight change:   Physical Exam: General: Alert and oriented in no apparent distress Heart: Regular rate and rhythm with no murmurs appreciated Lungs: CTA  bilaterally satting 98% on room air, no increased work of breathing Extremities: Trace lower extremity edema  Imaging: EEG  Result Date: 11/12/2020 Charlsie Quest, MD     11/12/2020 10:37 AM Patient Name: Jeffery Christian MRN: [id number] Epilepsy Attending: Charlsie Quest Referring Physician/Provider: Dr Caryl Pina Date: 11/12/2020 Duration: 23.19 mins Patient history: 70 yo M with ams. EEG to evaluate for seizure Level of alertness: Awake, drowsy, sleep, comatose, lethargic AEDs during EEG study: None Technical aspects: This EEG study was done with scalp electrodes positioned according to the 10-20 International system of electrode placement. Electrical activity was acquired at a sampling rate of 500Hz  and reviewed with a high frequency filter of 70Hz  and a low frequency filter of 1Hz . EEG data were recorded continuously and digitally stored. Description: The posterior dominant rhythm consists of 8-9 Hz activity of moderate voltage (25-35 uV) seen predominantly in posterior head regions, symmetric and reactive to eye opening and eye closing. Hyperventilation and photic stimulation were not performed.   IMPRESSION: This study is within normal limits. No seizures or epileptiform discharges were seen throughout the recording. Priyanka    Labs: BMET Recent Labs  Lab 11/10/20 1508 11/10/20 2000 11/11/20 0035 11/11/20 1500 11/11/20 2202 11/12/20 0345 11/12/20 1500 11/13/20 0304 11/14/20 0310  NA 167*   < > 163* 159* 154* 150* 151* 150* 148*  K 4.1   < > 3.8 3.5 5.6* 3.5 3.7 3.7 3.9  CL >130*   < > >130* >130* 129* 124* 122*  125* 119*  CO2 20*   < > 17* 17* 14* 17* 18* 19* 21*  GLUCOSE 152*   < > 212* 183* 160* 176* 133* 143* 122*  BUN 85*   < > 86* 74* 71* 62* 53* 49* 37*  CREATININE 2.58*   < > 2.46* 2.26* 2.45* 2.20* 1.90* 1.68* 1.41*  CALCIUM 8.5*   < > 8.6* 8.4* 8.0* 8.1* 8.2* 8.3* 8.3*  PHOS 3.5  --   --   --   --   --   --   --   --    < > = values in this interval not displayed.    CBC Recent Labs  Lab 11/11/20 0035 11/12/20 0345 11/13/20 0304 11/14/20 0310  WBC 9.1 8.6 6.8 9.4  NEUTROABS 8.2* 7.2 5.5 7.5  HGB 12.8* 11.7* 11.9* 12.3*  HCT 42.8 36.7* 35.9* 37.0*  MCV 91.6 89.5 86.5 85.1  PLT 252 208 191 214    Medications:    . Chlorhexidine Gluconate Cloth  6 each Topical Daily  . collagenase   Topical Daily  . enoxaparin (LOVENOX) injection  90 mg Subcutaneous Q12H  . feeding supplement  237 mL Oral BID BM  . metoprolol tartrate  25 mg Oral BID  . neomycin-bacitracin-polymyxin   Topical BID  . pantoprazole (PROTONIX) IV  40 mg Intravenous Q24H  . sodium chloride flush  10-40 mL Intracatheter Q12H     Derrel Nip, MD [facility] Family Medicine Resident, PGY2 11/14/2020, 7:06 AM

## 2020-11-14 NOTE — Progress Notes (Signed)
ANTICOAGULATION CONSULT NOTE  Pharmacy Consult for heparin to Lovenox Indication: atrial fibrillation  No Known Allergies  Patient Measurements: Height: 6' (182.9 cm) Weight: 98.7 kg (217 lb 9.5 oz) IBW/kg (Calculated) : 77.6 Heparin Dosing Weight: 97.5 kg  Vital Signs: Temp: 97.6 F (36.4 C) (02/13 1212) Temp Source: Oral (02/13 1212) BP: 111/71 (02/13 1212) Pulse Rate: 82 (02/13 1212)  Labs: Recent Labs    11/12/20 0345 11/12/20 1500 11/13/20 0304 11/14/20 0310  HGB 11.7*  --  11.9* 12.3*  HCT 36.7*  --  35.9* 37.0*  PLT 208  --  191 214  CREATININE 2.20* 1.90* 1.68* 1.41*    Estimated Creatinine Clearance: 61 mL/min (A) (by C-G formula based on SCr of 1.41 mg/dL (H)).   Medical History: Past Medical History:  Diagnosis Date  . Chronic systolic CHF (congestive heart failure) (HCC)    EF 35-40, diffuse HK, mild MR, moderate LAE, mild RAE, PASP 44, L pleural eff  . Dilated cardiomyopathy (HCC)    likely related to tachycardia  . Persistent atrial fibrillation (HCC)    Assessment: 69 yom on eliquis PTA presenting with AMS, currently on enoxaparin 90mg  [place] Q12 hours. Last dose eliquis unknown.  CBC stable. No s/sx bleeding.   Goal of Therapy:  Monitor platelets by anticoagulation protocol: Yes   Plan:  Continue enoxaparin 90mg   q12hours  Monitor renal function, s/sx bleeding  Thank you , PharmD  Please check AMION for all [facility] contact numbers 11/14/2020 3:00 PM

## 2020-11-15 DIAGNOSIS — I4891 Unspecified atrial fibrillation: Secondary | ICD-10-CM | POA: Diagnosis not present

## 2020-11-15 DIAGNOSIS — N179 Acute kidney failure, unspecified: Secondary | ICD-10-CM | POA: Diagnosis not present

## 2020-11-15 DIAGNOSIS — N183 Chronic kidney disease, stage 3 unspecified: Secondary | ICD-10-CM | POA: Diagnosis not present

## 2020-11-15 LAB — BASIC METABOLIC PANEL
Anion gap: 7 (ref 5–15)
BUN: 28 mg/dL — ABNORMAL HIGH (ref 8–23)
CO2: 21 mmol/L — ABNORMAL LOW (ref 22–32)
Calcium: 8 mg/dL — ABNORMAL LOW (ref 8.9–10.3)
Chloride: 114 mmol/L — ABNORMAL HIGH (ref 98–111)
Creatinine, Ser: 1.46 mg/dL — ABNORMAL HIGH (ref 0.61–1.24)
GFR, Estimated: 52 mL/min — ABNORMAL LOW (ref >60)
Glucose, Bld: 202 mg/dL — ABNORMAL HIGH (ref 70–99)
Potassium: 3.7 mmol/L (ref 3.5–5.1)
Sodium: 142 mmol/L (ref 135–145)

## 2020-11-15 MED ORDER — LACTATED RINGERS IV BOLUS
500.0000 mL | Freq: Once | INTRAVENOUS | Status: AC
  Administered 2020-11-15: 500 mL via INTRAVENOUS

## 2020-11-15 NOTE — TOC Progression Note (Signed)
Transition of Care [facility]) - Progression Note    Patient Details  Name: Jeffery Christian MRN: [id number] Date of Birth: 06/23/1951  [facility]) CM/SW Contact  Mearl Latin, LCSW Phone Number: 11/15/2020, 1:31 PM  Clinical Narrative:    CSW spoke with patient's daughter and provided update on potential discharge to [facility] tomorrow if they accept patient. [facility] unable to accept due to not being vaccinated.    Expected Discharge Plan: Skilled Nursing Facility Barriers to Discharge: Continued Medical Work up,SNF Covid  Expected Discharge Plan and Services Expected Discharge Plan: Skilled Nursing Facility In-house Referral: Clinical Social Work   Post Acute Care Choice: Skilled Nursing Facility Living arrangements for the past 2 months: Single Family Home                                       Social Determinants of Health (SDOH) Interventions    Readmission Risk Interventions No flowsheet data found.

## 2020-11-15 NOTE — TOC Progression Note (Signed)
Transition of Care [facility]) - Progression Note    Patient Details  Name: Jeffery Christian MRN: [id number] Date of Birth: 30-Jun-1951  Transition of Care [facility]) CM/SW Contact  Mearl Latin, LCSW Phone Number: 11/15/2020, 11:18 AM  Clinical Narrative:    [facility], they are reviewing clinicals.    Expected Discharge Plan: Skilled Nursing Facility Barriers to Discharge: Continued Medical Work up,SNF Covid  Expected Discharge Plan and Services Expected Discharge Plan: Skilled Nursing Facility In-house Referral: Clinical Social Work   Post Acute Care Choice: Skilled Nursing Facility Living arrangements for the past 2 months: Single Family Home                                       Social Determinants of Health (SDOH) Interventions    Readmission Risk Interventions No flowsheet data found.

## 2020-11-15 NOTE — Consult Note (Signed)
WOC Nurse Consult Note: Patient receiving care in [facility] [phone].   Reason for Consult: "pressure ulcer, status post hydrotherapy, majority of eschar is off, please give final recommendation for wound care as plan to discharge in 1 day" Wound type: See PT hydrotherapy note from today. Pressure Injury POA: Yes/No/NA Measurement: Wound bed: Drainage (amount, consistency, odor)  Periwound: Dressing procedure/placement/frequency: I have conferred with PT.  Continue with santyl and saline moistened gauze to the sacral wound. Change daily.  I have communicated this recommendation to Dr. Algis Downs. Elgergawy.  He has acknowledged my communication. WOC nurse will not follow at this time.  Please re-consult the WOC team if needed.  Helmut Muster, RN, MSN, CWOCN, CNS-BC, pager [phone]

## 2020-11-15 NOTE — Progress Notes (Addendum)
PROGRESS NOTE                                                                             PROGRESS NOTE                                                                                                                                                                                                             Patient Demographics:    Jeffery Christian, is a 70 y.o. male, DOB - 1951/06/23, ZOX:[id number]  Outpatient Primary MD for the patient is Hoy Register, MD    LOS - 6  Admit date - 11/09/2020    Chief Complaint  Patient presents with  . Code Stroke       Brief Narrative     Jeffery Christian is a 70 y.o. male with medical history significant for atrial fibrillation on Eliquis-last dose unknown, dilated cardiomyopathy, chronic systolic CHF, CKD 3 who presents by EMS after being found on the floor at home by his friends.  He was last seen about 1 week ago by his friends.  His friends not heard from him in the last week so they went to his house to check on him and upon getting no response at his door, knocked the door down and found him down on the ground between a dresser and  the bed.  On admission patient was unresponsive, work-up was significant for severe hypernatremia, AKI, encephalopathy, with right gluteal pressure ulcer, as well he tested positive for Covid.   Subjective:    Jeffery Christian with no significant events overnight, he tolerated his hydrotherapy this morning, no fever, no chills, no chest pain, appetite and oral intake has significantly improved .   Assessment  & Plan :    Principal Problem:   Atrial fibrillation with RVR (HCC) Active Problems:   Acute renal failure superimposed on stage 3 chronic kidney disease (HCC)   Acute metabolic encephalopathy   Hypernatremia   Elevated troponin   COVID-19 virus infection  Hypernatremia -Severe, due to hypovolemia, in the setting  of volume depletion and dehydration. -Nephrology input greatly  appreciated, corrected gradually and slowly, it has normalized. -He is with good oral intake, off IV fluids for last couple days.  COVID-19 infection -Patient tells me he is vaccinated earlier last year, but not boosted -No evidence of pneumonia on x-ray, no  oxygen requirement - with Remdesivir, no indication for steroids -Will need isolation for 10 days total  Acute metabolic encephalopathy: -Significantly improved -MRI incomplete study, but with no evidence of acute CVA or acute findings. -as well in the setting of hypernatremia and UTI - EEG within normal limits, no seizures or epileptiform discharges -I have discussed with daughter, there is no history of alcohol or drug abuse -Mentation has significantly improved.  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 -Followed by PT/OT, recommendation for SNF placement  AKI on CKD stage III: -Due to volume depletion and dehydration -CK within normal limit. -Renal ultrasound with no hydronephrosis - on p.o. bicarb. Given low bicarb levels. -Improving  A fib with RVR -Usually on amiodarone drip, but with poor control, transition to Cardizem drip, this has significantly improved, Cardizem drip discontinued today, continue with p.o. metoprolol . -On Eliquis at home, he is on Lovenox during hospital stay.    Hypertension:  -Blood pressure acceptable on metoprolol  Hyperkalemia:  -Improving  Elevated ALT -Mild, due to Covid, monitor closely as on remdesivir  UTI -Continue with Rocephin x3 days, urine culture unhelpful as growing multiple species.  Right gluteal unstageable pressure ulcer -This is present on admission, and right buttock area, as he was on the floor  for unknown period of time, wound care has been consulted, continue with hydrotherapy , cussed with wound care, improving, most of eschars of.  Pressure Injury Buttocks Right Unstageable - Full thickness tissue loss in which the base of the injury is covered by slough (yellow, tan, gray, green or brown) and/or eschar (tan, brown or black) in the wound bed. (Active)     Location: Buttocks  Location Orientation: Right  Staging: Unstageable - Full thickness tissue loss in which the base of the injury is covered by slough (yellow, tan, gray, green or brown) and/or eschar (tan, brown or black) in the wound bed.  Wound Description (Comments):   Present on Admission: Yes   Wounds at glans penis, around Foley insertion site -Discussed with urology Dr. Liliane Shi, he recommends local wound care including Neosporin and washing with soap and water, and outpatient follow-up with him once stable.  Wound has significantly improved.  Urinary retention -Failed voiding trial 2/12, Foley catheter reinserted, will start on Flomax  Right arm swelling due to infiltration at IV site -No evidence of infection, continue with right arm elevation, IV access has been moved to the left arm.   SpO2: 92 % O2 Flow Rate (L/min): 3 L/min  Recent Labs  Lab 11/09/20 2113 11/09/20 2120 11/09/20 2242 11/10/20 0051 11/10/20 0804 11/10/20 1508 11/11/20 0035 11/12/20 0345 11/13/20 0304 11/14/20 0310  WBC  --   --   --   --  9.4  --  9.1 8.6 6.8 9.4  PLT  --   --   --   --  318  --  252 208 191 214  CRP  --   --   --   --  2.7*  --  2.8* 2.0* 3.5*  --   AST  --   --   --   --  21  --  24 27 32 38  ALT  --   --   --   --  25  --  24 26 41 47*  ALKPHOS  --   --   --   --  62  --  56 56 64 71  BILITOT  --   --   --   --  1.4*  --  1.0 0.6 0.7 0.7  ALBUMIN  --   --   --   --  2.5* 2.6* 2.3* 2.2* 2.1* 2.0*  INR  --   --  1.4*  --   --   --   --   --   --   --   LATICACIDVEN  --  2.4*  --  3.2*  --   --   --   --   --   --    SARSCOV2NAA POSITIVE*  --   --   --   --   --   --   --   --   --        ABG  No results found for: PHART, PCO2ART, PO2ART, HCO3, TCO2, ACIDBASEDEF, O2SAT     Condition - Extremely Guarded  Family Communication  :  D/W daughter by phone daily, she was left voicemail on 2/12, 2/13, 2/14.  Still awaiting callback from her.  Code Status :  full  Consults  :  Renal, Neurology  Disposition Plan  :    Status is: Inpatient  Remains inpatient appropriate because:Hemodynamically unstable, Unsafe d/c plan and IV treatments appropriate due to intensity of illness or inability to take PO   Dispo: The patient is from: Home              Anticipated d/c is to: SNF              Anticipated d/c date is: 1 day              Patient currently is not medically stable to d/c.   Difficult to place patient Yes      DVT Prophylaxis  :   Heparin GTT>> lovenox  Lab Results  Component Value Date   PLT 214 11/14/2020    Diet :  Diet Order            DIET DYS 3 Room service appropriate? Yes with Assist; Fluid consistency: Thin  Diet effective now                  Inpatient Medications  Scheduled Meds: . Chlorhexidine Gluconate Cloth  6 each Topical Daily  . collagenase   Topical Daily  . enoxaparin (LOVENOX) injection  90 mg Subcutaneous Q12H  . feeding supplement  237 mL Oral BID BM  . metoprolol tartrate  25 mg Oral BID  . neomycin-bacitracin-polymyxin   Topical BID  . pantoprazole  40 mg Oral Daily  . sodium chloride flush  10-40 mL Intracatheter Q12H  . tamsulosin  0.4 mg Oral QPC supper   Continuous Infusions:  PRN Meds:.acetaminophen, albuterol, haloperidol lactate, LORazepam, sodium chloride flush  Antibiotics  :    Anti-infectives (From admission, onward)   Start     Dose/Rate Route Frequency Ordered Stop   11/11/20 1000  remdesivir 100 mg in sodium chloride 0.9 % 100 mL IVPB  Status:  Discontinued       "  Followed by" Linked Group Details   100 mg 200 mL/hr  over 30 Minutes Intravenous Daily 11/10/20 0101 11/10/20 0103   11/11/20 0800  cefTRIAXone (ROCEPHIN) 1 g in sodium chloride 0.9 % 100 mL IVPB        1 g 200 mL/hr over 30 Minutes Intravenous Every 24 hours 11/11/20 0732 11/15/20 0835   11/10/20 1600  remdesivir 100 mg in sodium chloride 0.9 % 100 mL IVPB        100 mg 200 mL/hr over 30 Minutes Intravenous Daily 11/09/20 2346 11/13/20 0910   11/10/20 0000  remdesivir 200 mg in sodium chloride 0.9% 250 mL IVPB        200 mg 580 mL/hr over 30 Minutes Intravenous Once 11/09/20 2346 11/10/20 0441   11/09/20 2345  remdesivir 200 mg in sodium chloride 0.9% 250 mL IVPB  Status:  Discontinued       "Followed by" Linked Group Details   200 mg 580 mL/hr over 30 Minutes Intravenous Once 11/10/20 0101 11/10/20 0103       Cecil Vandyke M.D on 11/15/2020 at 10:59 AM  To page go to [email or web address]  [facility] -  Office  [phone]      Objective:   Vitals:   11/14/20 2148 11/15/20 0000 11/15/20 0415 11/15/20 0736  BP: 123/76 119/74 112/61 112/81  Pulse:  91 88 81  Resp:  20 18 14   Temp:  98 F (36.7 C) 97.6 F (36.4 C) 98.1 F (36.7 C)  TempSrc:  Axillary Oral Oral  SpO2:  97% 97% 92%  Weight:      Height:        Wt Readings from Last 3 Encounters:  11/09/20 98.7 kg  09/08/20 98.7 kg  06/08/20 93.9 kg     Intake/Output Summary (Last 24 hours) at 11/15/2020 1059 Last data filed at 11/15/2020 0419 Gross per 24 hour  Intake 605.33 ml  Output 3175 ml  Net -2569.67 ml     Physical Exam  Awake Alert, he is more appropriate and coherent today, but remains easily distracted with some confusion.   Symmetrical Chest wall movement, Good air movement bilaterally, CTAB RRR,No Gallops,Rubs or new Murmurs, No Parasternal Heave +ve B.Sounds, Abd Soft, No tenderness, No rebound - guarding or rigidity. No Cyanosis, or edema, right lower extremity weakness, right knee bruising, right gluteal unstageable pressure ulcer, wound  at glans penis has significantly  improved..     Data Review:    CBC Recent Labs  Lab 11/10/20 0804 11/11/20 0035 11/12/20 0345 11/13/20 0304 11/14/20 0310  WBC 9.4 9.1 8.6 6.8 9.4  HGB 14.9 12.8* 11.7* 11.9* 12.3*  HCT 50.7 42.8 36.7* 35.9* 37.0*  PLT 318 252 208 191 214  MCV 92.5 91.6 89.5 86.5 85.1  MCH 27.2 27.4 28.5 28.7 28.3  MCHC 29.4* 29.9* 31.9 33.1 33.2  RDW 16.8* 16.6* 16.7* 15.9* 15.9*  LYMPHSABS 0.6* 0.5* 0.7 0.8 1.1  MONOABS 0.5 0.4 0.6 0.4 0.5  EOSABS 0.0 0.0 0.0 0.1 0.2  BASOSABS 0.0 0.0 0.0 0.0 0.0    Recent Labs  Lab 11/09/20 2120 11/09/20 2242 11/10/20 0051 11/10/20 0804 11/10/20 1508 11/10/20 2000 11/11/20 0035 11/11/20 1500 11/12/20 0345 11/12/20 1500 11/13/20 0304 11/14/20 0310 11/15/20 0306  NA  --   --   --  166* 167*   < > 163*   < > 150* 151* 150* 148* 142  K  --   --   --  4.6 4.1   < > 3.8   < >  3.5 3.7 3.7 3.9 3.7  CL  --   --   --  >130* >130*   < > >130*   < > 124* 122* 125* 119* 114*  CO2  --   --   --  20* 20*   < > 17*   < > 17* 18* 19* 21* 21*  GLUCOSE  --   --   --  184* 152*   < > 212*   < > 176* 133* 143* 122* 202*  BUN  --   --   --  89* 85*   < > 86*   < > 62* 53* 49* 37* 28*  CREATININE  --   --   --  2.65* 2.58*   < > 2.46*   < > 2.20* 1.90* 1.68* 1.41* 1.46*  CALCIUM  --   --   --  8.8* 8.5*   < > 8.6*   < > 8.1* 8.2* 8.3* 8.3* 8.0*  AST  --   --   --  21  --   --  24  --  27  --  32 38  --   ALT  --   --   --  25  --   --  24  --  26  --  41 47*  --   ALKPHOS  --   --   --  62  --   --  56  --  56  --  64 71  --   BILITOT  --   --   --  1.4*  --   --  1.0  --  0.6  --  0.7 0.7  --   ALBUMIN  --   --   --  2.5* 2.6*  --  2.3*  --  2.2*  --  2.1* 2.0*  --   MG  --   --   --   --   --   --   --   --   --   --   --  2.2  --   CRP  --   --   --  2.7*  --   --  2.8*  --  2.0*  --  3.5*  --   --   LATICACIDVEN 2.4*  --  3.2*  --   --   --   --   --   --   --   --   --   --   INR  --  1.4*  --   --   --   --   --   --    --   --   --   --   --   TSH  --   --   --   --   --   --   --   --   --   --   --  2.627  --   AMMONIA  --   --   --   --   --   --   --   --   --   --   --  17  --    < > = values in this interval not displayed.    ------------------------------------------------------------------------------------------------------------------ No results for input(s): CHOL, HDL, LDLCALC, TRIG, CHOLHDL, LDLDIRECT in the last 72 hours.  Lab Results  Component Value Date   HGBA1C 5.3 06/08/2020   ------------------------------------------------------------------------------------------------------------------ Recent Labs    11/14/20 0310  TSH 2.627    Cardiac Enzymes No results for input(s): CKMB, TROPONINI, MYOGLOBIN in the last 168 hours.  Invalid input(s): CK ------------------------------------------------------------------------------------------------------------------    Component Value Date/Time   BNP 1,922.3 (H) 08/04/2017 2132    Micro Results Recent Results (from the past 240 hour(s))  Resp Panel by RT-PCR (Flu A&B, Covid) Nasopharyngeal Swab     Status: Abnormal   Collection Time: 11/09/20  9:13 PM   Specimen: Nasopharyngeal Swab; Nasopharyngeal(NP) swabs in vial transport medium  Result Value Ref Range Status   SARS Coronavirus 2 by RT PCR POSITIVE (A) NEGATIVE Final    Comment: RESULT CALLED TO, READ BACK BY AND VERIFIED WITH: Minus Breeding RN 11/09/20 2227 JDW (NOTE) SARS-CoV-2 target nucleic acids are DETECTED.  The SARS-CoV-2 RNA is generally detectable in upper respiratory specimens during the acute phase of infection. Positive results are indicative of the presence of the identified virus, but do not rule out bacterial infection or co-infection with other pathogens not detected by the test. Clinical correlation with patient history and other diagnostic information is necessary to determine patient infection status. The expected result is Negative.  Fact Sheet for  Patients: BloggerCourse.com  Fact Sheet for Healthcare Providers: SeriousBroker.it  This test is not yet approved or cleared by the [place] FDA and  has been authorized for detection and/or diagnosis of SARS-CoV-2 by FDA under an Emergency Use Authorization (EUA).  This EUA will remain in effect (meaning this test can be used)  for the duration of  the COVID-19 declaration under Section 564(b)(1) of the Act, 21 U.S.C. section 360bbb-3(b)(1), unless the authorization is terminated or revoked sooner.     Influenza A by PCR NEGATIVE NEGATIVE Final   Influenza B by PCR NEGATIVE NEGATIVE Final    Comment: (NOTE) The Xpert Xpress SARS-CoV-2/FLU/RSV plus assay is intended as an aid in the diagnosis of influenza from Nasopharyngeal swab specimens and should not be used as a sole basis for treatment. Nasal washings and aspirates are unacceptable for Xpert Xpress SARS-CoV-2/FLU/RSV testing.  Fact Sheet for Patients: BloggerCourse.com  Fact Sheet for Healthcare Providers: SeriousBroker.it  This test is not yet approved or cleared by the [place] FDA and has been authorized for detection and/or diagnosis of SARS-CoV-2 by FDA under an Emergency Use Authorization (EUA). This EUA will remain in effect (meaning this test can be used) for the duration of the COVID-19 declaration under Section 564(b)(1) of the Act, 21 U.S.C. section 360bbb-3(b)(1), unless the authorization is terminated or revoked.  Performed at [facility], [address] [place], [place] [address]   Culture, Urine     Status: Abnormal   Collection Time: 11/11/20  7:34 AM   Specimen: Urine, Random  Result Value Ref Range Status   Specimen Description URINE, RANDOM  Final   Special Requests   Final    NONE Performed at [facility], [address], [place], [place] [address]    Culture MULTIPLE  SPECIES PRESENT, SUGGEST RECOLLECTION (A)  Final   Report Status 11/12/2020 FINAL  Final    Radiology Reports EEG  Result Date: 11/12/2020 Charlsie Quest, MD     11/12/2020 10:37 AM Patient Name: Ludwin Flahive MRN: [id number] Epilepsy Attending: Charlsie Quest Referring Physician/Provider: Dr Caryl Pina Date: 11/12/2020 Duration: 23.19 mins Patient history: 70 yo M with ams. EEG to evaluate for seizure Level of alertness: Awake, drowsy, sleep, comatose, lethargic AEDs during EEG study: None Technical aspects: This EEG study was done with  scalp electrodes positioned according to the 10-20 International system of electrode placement. Electrical activity was acquired at a sampling rate of 500Hz  and reviewed with a high frequency filter of 70Hz  and a low frequency filter of 1Hz . EEG data were recorded continuously and digitally stored. Description: The posterior dominant rhythm consists of 8-9 Hz activity of moderate voltage (25-35 uV) seen predominantly in posterior head regions, symmetric and reactive to eye opening and eye closing. Hyperventilation and photic stimulation were not performed.   IMPRESSION: This study is within normal limits. No seizures or epileptiform discharges were seen throughout the recording.   CT Code Stroke CTA Head W/WO contrast  Result Date: 11/09/2020 CLINICAL DATA:  Initial evaluation for acute stroke, left-sided weakness. EXAM: CT ANGIOGRAPHY HEAD AND NECK CT PERFUSION BRAIN TECHNIQUE: Multidetector CT imaging of the head and neck was performed using the standard protocol during bolus administration of intravenous contrast. Multiplanar CT image reconstructions and MIPs were obtained to evaluate the vascular anatomy. Carotid stenosis measurements (when applicable) are obtained utilizing NASCET criteria, using the distal internal carotid diameter as the denominator. Multiphase CT imaging of the brain was performed following IV bolus contrast injection. Subsequent  parametric perfusion maps were calculated using RAPID software. CONTRAST:  100 cc of Omnipaque 350. COMPARISON:  Prior head CT from earlier the same day. FINDINGS: CTA NECK FINDINGS Aortic arch: Visualized aortic arch of normal caliber with normal branch pattern. No hemodynamically significant stenosis about the origin of the great vessels. Visualized subclavian arteries widely patent. Right carotid system: Right common and internal carotid arteries widely patent without stenosis, dissection or occlusion. Left carotid system: Left CCA patent from its origin to the bifurcation without stenosis. Mild eccentric soft plaque at the origin of the left ICA without significant stenosis. Left ICA patent distally without stenosis, dissection or occlusion. Vertebral arteries: Both vertebral arteries arise from the subclavian arteries. Dominant left vertebral artery with a diffusely hypoplastic right vertebral artery. Left vertebral artery widely patent within the neck without stenosis or other abnormality. Severe stenosis at the origin of the right vertebral artery. Hypoplastic right vertebral artery otherwise patent within the neck without stenosis or other abnormality. Skeleton: No visible acute osseous abnormality, although cervical spine better evaluated on concomitant CT of the cervical spine. No discrete or worrisome osseous lesions. Other neck: No other acute soft tissue abnormality within the neck. No mass or adenopathy. Upper chest: Scattered atelectatic changes noted within the visualized lungs. Few scattered superimposed tree-in-bud nodular densities within the peripheral right upper lobe could reflect changes of acute bronchiolitis and/or mucoid impaction. No frank airspace consolidation. Review of the MIP images confirms the above findings CTA HEAD FINDINGS Anterior circulation: Petrous, cavernous, and supraclinoid segments of both internal carotid arteries are widely patent without stenosis. A1 segments patent  bilaterally. Normal anterior communicating artery. 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 Finding presumably chronic in nature given the chronic bilateral ACA territory infarcts seen on prior noncontrast head CT. Scant distal reconstitution, likely collateral. M1 segments patent bilaterally. Normal MCA bifurcations. Distal MCA branches well perfused and symmetric. Posterior circulation: Dominant left vertebral artery widely patent to the vertebrobasilar junction. Left PICA patent. Hypoplastic right vertebral artery grossly patent to the vertebrobasilar junction as well. Right PICA not visualized. Basilar patent to its distal aspect without stenosis. Superior cerebellar arteries patent bilaterally. Fetal type origin of the left PCA. Right PCA supplied via the basilar as well as a robust right posterior communicating artery. Scattered atheromatous irregularity within  both PCAs without high-grade stenosis. Short-segment mild right P2 stenosis noted (series 10, image 19). Venous sinuses: Not well assessed due to timing of the contrast bolus. Anatomic variants: Fetal type origin of the left PCA. Hypoplastic right vertebral artery. 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 Review of the MIP images confirms the above findings CT Brain Perfusion Findings: ASPECTS: 10 CBF (<30%) Volume: 0mL Perfusion (Tmax>6.0s) volume: 18mL Mismatch Volume: 18mL Infarction Location:Negative CT perfusion for acute ischemia. Delayed perfusion seen within the right ACA distribution, in keeping with the chronic ACA occlusion. IMPRESSION: CTA HEAD AND NECK IMPRESSION: 1. Negative CTA for emergent large vessel occlusion. 2. 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 3. Scattered mild for age atheromatous change elsewhere about the major arterial vasculature of the head and neck. No other hemodynamically significant or correctable stenosis. 4. Few scattered tree-in-bud nodular  densities within right upper lobe, nonspecific, but could reflect changes of acute bronchiolitis and/or mucoid impaction. No frank airspace consolidation. CT PERFUSION IMPRESSION: 1. Negative CT perfusion for acute ischemia. 2. 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 Critical Value/emergent results were called by telephone at the time of interpretation on 11/09/2020 at 9:45 pm to provider Dr [facility] , who verbally acknowledged these results. Electronically Signed   By: Rise Mu M.D.   On: 11/09/2020 22:11   DG Pelvis 1-2 Views  Result Date: 11/09/2020 CLINICAL DATA:  Fall EXAM: PELVIS - 1-2 VIEW COMPARISON:  CT 03/22/2014 FINDINGS: Bones of the pelvis appear intact and congruent. Proximal femora intact and normally located within the acetabula. Moderate degenerative changes noted throughout the lower lumbar spine, bilateral SI joints, and bilateral hips. Some mild left hip soft tissue swelling is present. Finding on a background of diffuse mild body wall edema. Some hyperdense likely excreted contrast media seen within the urinary bladder. Slightly lobular, crenulated bladder contours and possible diverticular outpouchings, can be seen in the setting chronic outlet obstruction particularly in this patient with a history of prostatomegaly seen on comparison pelvic CT. No other acute or suspicious soft tissue abnormalities. IMPRESSION: 1. Mild left hip soft tissue swelling without acute fracture or traumatic malalignment. 2. Moderate degenerative changes throughout the spine, bilateral SI joints and hips. 3. Bladder opacified by excreted contrast media. Slightly lobular, crenulated bladder contours and possible diverticular outpouchings, can be seen in the setting of chronic outlet obstruction particularly in this patient with a history of prostatomegaly. Electronically Signed   By: Kreg Shropshire M.D.   On:  11/09/2020 22:32   DG Knee 1-2 Views Right  Result Date: 11/10/2020 CLINICAL DATA:  Found down, tender to palpation EXAM: RIGHT KNEE - 1-2 VIEW COMPARISON:  None. FINDINGS: Frontal and lateral views of the right knee demonstrate 3 compartmental osteoarthritis most severe in the medial compartment. No acute fracture, subluxation, or dislocation. No joint effusion. IMPRESSION: 1. Three compartmental osteoarthritis.  No acute fracture. Electronically Signed   By: Sharlet Salina M.D.   On: 11/10/2020 19:12   CT Code Stroke CTA Neck W/WO contrast  Result Date: 11/09/2020 CLINICAL DATA:  Initial evaluation for acute stroke, left-sided weakness. EXAM: CT ANGIOGRAPHY HEAD AND NECK CT PERFUSION BRAIN TECHNIQUE: Multidetector CT imaging of the head and neck was performed using the standard protocol during bolus administration of intravenous contrast. Multiplanar CT image reconstructions and MIPs were obtained to evaluate the vascular anatomy. Carotid stenosis measurements (when applicable) are obtained utilizing NASCET  criteria, using the distal internal carotid diameter as the denominator. Multiphase CT imaging of the brain was performed following IV bolus contrast injection. Subsequent parametric perfusion maps were calculated using RAPID software. CONTRAST:  100 cc of Omnipaque 350. COMPARISON:  Prior head CT from earlier the same day. FINDINGS: CTA NECK FINDINGS Aortic arch: Visualized aortic arch of normal caliber with normal branch pattern. No hemodynamically significant stenosis about the origin of the great vessels. Visualized subclavian arteries widely patent. Right carotid system: Right common and internal carotid arteries widely patent without stenosis, dissection or occlusion. Left carotid system: Left CCA patent from its origin to the bifurcation without stenosis. Mild eccentric soft plaque at the origin of the left ICA without significant stenosis. Left ICA patent distally without stenosis, dissection or  occlusion. Vertebral arteries: Both vertebral arteries arise from the subclavian arteries. Dominant left vertebral artery with a diffusely hypoplastic right vertebral artery. Left vertebral artery widely patent within the neck without stenosis or other abnormality. Severe stenosis at the origin of the right vertebral artery. Hypoplastic right vertebral artery otherwise patent within the neck without stenosis or other abnormality. Skeleton: No visible acute osseous abnormality, although cervical spine better evaluated on concomitant CT of the cervical spine. No discrete or worrisome osseous lesions. Other neck: No other acute soft tissue abnormality within the neck. No mass or adenopathy. Upper chest: Scattered atelectatic changes noted within the visualized lungs. Few scattered superimposed tree-in-bud nodular densities within the peripheral right upper lobe could reflect changes of acute bronchiolitis and/or mucoid impaction. No frank airspace consolidation. Review of the MIP images confirms the above findings CTA HEAD FINDINGS Anterior circulation: Petrous, cavernous, and supraclinoid segments of both internal carotid arteries are widely patent without stenosis. A1 segments patent bilaterally. Normal anterior communicating artery. 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 Finding presumably chronic in nature given the chronic bilateral ACA territory infarcts seen on prior noncontrast head CT. Scant distal reconstitution, likely collateral. M1 segments patent bilaterally. Normal MCA bifurcations. Distal MCA branches well perfused and symmetric. Posterior circulation: Dominant left vertebral artery widely patent to the vertebrobasilar junction. Left PICA patent. Hypoplastic right vertebral artery grossly patent to the vertebrobasilar junction as well. Right PICA not visualized. Basilar patent to its distal aspect without stenosis. Superior cerebellar arteries patent bilaterally. Fetal type origin of the  left PCA. Right PCA supplied via the basilar as well as a robust right posterior communicating artery. Scattered atheromatous irregularity within both PCAs without high-grade stenosis. Short-segment mild right P2 stenosis noted (series 10, image 19). Venous sinuses: Not well assessed due to timing of the contrast bolus. Anatomic variants: Fetal type origin of the left PCA. Hypoplastic right vertebral artery. 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 Review of the MIP images confirms the above findings CT Brain Perfusion Findings: ASPECTS: 10 CBF (<30%) Volume: 24mL Perfusion (Tmax>6.0s) volume: 61mL Mismatch Volume: 64mL Infarction Location:Negative CT perfusion for acute ischemia. Delayed perfusion seen within the right ACA distribution, in keeping with the chronic ACA occlusion. IMPRESSION: CTA HEAD AND NECK IMPRESSION: 1. Negative CTA for emergent large vessel occlusion. 2. 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 3. Scattered mild for age atheromatous change elsewhere about the major arterial vasculature of the head and neck. No other hemodynamically significant or correctable stenosis. 4. Few scattered tree-in-bud nodular densities within right upper lobe, nonspecific, but could reflect changes of acute bronchiolitis and/or mucoid impaction. No frank airspace consolidation. CT PERFUSION IMPRESSION: 1. Negative CT perfusion for acute  ischemia. 2. 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 Critical Value/emergent results were called by telephone at the time of interpretation on 11/09/2020 at 9:45 pm to provider [facility] , who verbally acknowledged these results. Electronically Signed   By: Rise Mu M.D.   On: 11/09/2020 22:11   MR BRAIN WO CONTRAST  Result Date: 11/10/2020 CLINICAL DATA:  Stroke follow-up. EXAM: MRI HEAD WITHOUT CONTRAST TECHNIQUE:  Multiplanar, multiecho pulse sequences of the brain and surrounding structures were obtained without intravenous contrast. COMPARISON:  Head CT November 09, 2020. FINDINGS: Incomplete study due to patient inability to lie still in the scanner for the duration the study. Chronic bilateral ACA territory infarct. T2 hyperintensity within the periventricular white matter, nonspecific, most likely related to chronic small vessel ischemia. Brain: No acute infarction, hemorrhage, hydrocephalus, extra-axial collection or mass lesion. Vascular: Normal flow voids at the skull base. Sinuses/Orbits: Opacification of the bilateral frontal sinuses. The orbits are grossly unremarkable. IMPRESSION: 1. Incomplete study . 2. No acute intracranial abnormality identified. 3. Bilateral frontal sinus disease. Electronically Signed   By: Baldemar Lenis M.D.   On: 11/10/2020 18:00   US RENAL  Result Date: 11/10/2020 CLINICAL DATA:  Rule out hydronephrosis EXAM: RENAL / URINARY TRACT ULTRASOUND COMPLETE COMPARISON:  None. FINDINGS: Right Kidney: Renal measurements: 8.7 x 5.1 x 4.7 cm = volume: 109 mL. Echogenicity within normal limits. No mass or hydronephrosis visualized. Left Kidney: Renal measurements: 10.0 x 4.7 x 4.6 cm = volume: 112 mL. Echogenicity within normal limits. No mass or hydronephrosis visualized. Bladder: Decompressed with Foley catheter in place. Other: None. IMPRESSION: No acute findings.  No hydronephrosis. Electronically Signed   By: Charlett Nose M.D.   On: 11/10/2020 21:40   CT C-SPINE NO CHARGE  Result Date: 11/09/2020 CLINICAL DATA:  Left-sided weakness EXAM: CT CERVICAL SPINE WITHOUT CONTRAST TECHNIQUE: Multidetector CT imaging of the cervical spine was performed without intravenous contrast. Multiplanar CT image reconstructions were also generated. COMPARISON:  None. FINDINGS: Alignment: Alignment is anatomic. Skull base and vertebrae: No acute fracture. No primary bone lesion or focal  pathologic process. Soft tissues and spinal canal: Please refer to separately reported CT angiography of the neck for description of vascular findings. No prevertebral fluid or swelling. No visible canal hematoma. Disc levels: Bridging anterior osteophytes are seen throughout the entire cervical spine. Mild disc osteophyte complex at C4-5. No significant central canal or neural foraminal encroachment. Upper chest: Airway is patent. Lung apices are clear. Other: Reconstructed images confirm the above findings. IMPRESSION: 1. No acute cervical spine fracture. 2. Diffuse cervical spondylosis. No significant neural foraminal or central canal stenosis. Electronically Signed   By: Sharlet Salina M.D.   On: 11/09/2020 22:02   CT Code Stroke Cerebral Perfusion with contrast  Result Date: 11/09/2020 CLINICAL DATA:  Initial evaluation for acute stroke, left-sided weakness. EXAM: CT ANGIOGRAPHY HEAD AND NECK CT PERFUSION BRAIN TECHNIQUE: Multidetector CT imaging of the head and neck was performed using the standard protocol during bolus administration of intravenous contrast. Multiplanar CT image reconstructions and MIPs were obtained to evaluate the vascular anatomy. Carotid stenosis measurements (when applicable) are obtained utilizing NASCET criteria, using the distal internal carotid diameter as the denominator. Multiphase CT imaging of the brain was performed following IV bolus contrast injection. Subsequent parametric perfusion maps were calculated using RAPID software. CONTRAST:  100 cc of Omnipaque 350. COMPARISON:  Prior head CT from earlier  the same day. FINDINGS: CTA NECK FINDINGS Aortic arch: Visualized aortic arch of normal caliber with normal branch pattern. No hemodynamically significant stenosis about the origin of the great vessels. Visualized subclavian arteries widely patent. Right carotid system: Right common and internal carotid arteries widely patent without stenosis, dissection or occlusion. Left  carotid system: Left CCA patent from its origin to the bifurcation without stenosis. Mild eccentric soft plaque at the origin of the left ICA without significant stenosis. Left ICA patent distally without stenosis, dissection or occlusion. Vertebral arteries: Both vertebral arteries arise from the subclavian arteries. Dominant left vertebral artery with a diffusely hypoplastic right vertebral artery. Left vertebral artery widely patent within the neck without stenosis or other abnormality. Severe stenosis at the origin of the right vertebral artery. Hypoplastic right vertebral artery otherwise patent within the neck without stenosis or other abnormality. Skeleton: No visible acute osseous abnormality, although cervical spine better evaluated on concomitant CT of the cervical spine. No discrete or worrisome osseous lesions. Other neck: No other acute soft tissue abnormality within the neck. No mass or adenopathy. Upper chest: Scattered atelectatic changes noted within the visualized lungs. Few scattered superimposed tree-in-bud nodular densities within the peripheral right upper lobe could reflect changes of acute bronchiolitis and/or mucoid impaction. No frank airspace consolidation. Review of the MIP images confirms the above findings CTA HEAD FINDINGS Anterior circulation: Petrous, cavernous, and supraclinoid segments of both internal carotid arteries are widely patent without stenosis. A1 segments patent bilaterally. Normal anterior communicating artery. 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 Finding presumably chronic in nature given the chronic bilateral ACA territory infarcts seen on prior noncontrast head CT. Scant distal reconstitution, likely collateral. M1 segments patent bilaterally. Normal MCA bifurcations. Distal MCA branches well perfused and symmetric. Posterior circulation: Dominant left vertebral artery widely patent to the vertebrobasilar junction. Left PICA patent. Hypoplastic  right vertebral artery grossly patent to the vertebrobasilar junction as well. Right PICA not visualized. Basilar patent to its distal aspect without stenosis. Superior cerebellar arteries patent bilaterally. Fetal type origin of the left PCA. Right PCA supplied via the basilar as well as a robust right posterior communicating artery. Scattered atheromatous irregularity within both PCAs without high-grade stenosis. Short-segment mild right P2 stenosis noted (series 10, image 19). Venous sinuses: Not well assessed due to timing of the contrast bolus. Anatomic variants: Fetal type origin of the left PCA. Hypoplastic right vertebral artery. 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 Review of the MIP images confirms the above findings CT Brain Perfusion Findings: ASPECTS: 10 CBF (<30%) Volume: 0mL Perfusion (Tmax>6.0s) volume: 18mL Mismatch Volume: 18mL Infarction Location:Negative CT perfusion for acute ischemia. Delayed perfusion seen within the right ACA distribution, in keeping with the chronic ACA occlusion. IMPRESSION: CTA HEAD AND NECK IMPRESSION: 1. Negative CTA for emergent large vessel occlusion. 2. 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 3. Scattered mild for age atheromatous change elsewhere about the major arterial vasculature of the head and neck. No other hemodynamically significant or correctable stenosis. 4. Few scattered tree-in-bud nodular densities within right upper lobe, nonspecific, but could reflect changes of acute bronchiolitis and/or mucoid impaction. No frank airspace consolidation. CT PERFUSION IMPRESSION: 1. Negative CT perfusion for acute ischemia. 2. 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 Critical Value/emergent results were called by telephone at the time of interpretation on 11/09/2020 at 9:45 pm to provider [facility] , who verbally  acknowledged these results. Electronically Signed   By: Rise Mu M.D.   On: 11/09/2020 22:11   DG CHEST PORT 1 VIEW  Result Date: 11/11/2020 CLINICAL DATA:  PICC line placement EXAM: PORTABLE CHEST 1 VIEW COMPARISON:  11/09/2020 FINDINGS: Left upper extremity PICC line is been placed with its tip within the superior vena cava. Minimal left basilar atelectasis. Lungs are otherwise clear. No pneumothorax or pleural effusion. Cardiac size within normal limits. Pulmonary vascularity is normal. No acute bone abnormality. IMPRESSION: Left upper extremity PICC line tip within the superior vena cava. Electronically Signed   By: Helyn Numbers MD   On: 11/11/2020 13:12   DG Chest Port 1 View  Result Date: 11/09/2020 CLINICAL DATA:  Fall EXAM: PORTABLE CHEST 1 VIEW COMPARISON:  None. FINDINGS: The heart size and mediastinal contours are within normal limits. Both lungs are clear. The visualized skeletal structures are unremarkable. IMPRESSION: No active disease. Electronically Signed   By: Jonna Clark M.D.   On: 11/09/2020 22:38   DG Swallowing Func-Speech Pathology  Result Date: 11/11/2020 Objective Swallowing Evaluation: Type of Study: MBS-Modified Barium Swallow Study  Patient Details Name: Konor Noren MRN: [id number] Date of Birth: 06-24-1951 Today's Date: 11/11/2020 Time: SLP Start Time (ACUTE ONLY): 1437 -SLP Stop Time (ACUTE ONLY): 1455 SLP Time Calculation (min) (ACUTE ONLY): 18 min Past Medical History: Past Medical History: Diagnosis Date . Chronic systolic CHF (congestive heart failure) (HCC)   EF 35-40, diffuse HK, mild MR, moderate LAE, mild RAE, PASP 44, L pleural eff . Dilated cardiomyopathy (HCC)   likely related to tachycardia . Persistent atrial fibrillation [facility])  Past Surgical History: Past Surgical History: Procedure Laterality Date . CARDIOVERSION N/A 08/20/2017  Procedure: CARDIOVERSION;  Surgeon: [name], MD;  Location: [facility] ENDOSCOPY;  Service: Cardiovascular;  Laterality:  N/A; . INCISION AND DRAINAGE ABSCESS Left 03/22/2014  Procedure: INCISION AND DRAINAGE ABSCESS LEFT BUTTOCK ABSCESS;  Surgeon: [name], MD;  Location: [facility] OR;  Service: General;  Laterality: Left; HPI: Pt is a 70 y.o. male with medical history significant for atrial fibrillation on Eliquis-last dose unknown, dilated cardiomyopathy, chronic systolic CHF, CKD 3 who presented after being found on the floor at home by his friends who had last seen him one week prior. EMT noticed increased weakness on the left and he was brought in as a code stroke; this was ultimately cancelled by neurology. MRI was negative for acute changes. Pt found to have COVID-19. CXR 2/8 was negative.  No data recorded Assessment / Plan / Recommendation CHL IP CLINICAL IMPRESSIONS 11/11/2020 Clinical Impression Cervical osteophytes were noted, but did not significantly impact swallow function. Pt demonstrated decreased bolus cohesion, a pharyngeal delay, and incomplete epiglottic inverversion with the initial bolus of thin liquids via cup which was administered by the SLP. Premature spillage was noted to the pyriform sinuses and aspiration (PAS 7) noted thereafter secondary to the pharyngeal delay and incomplete epiglottic inversion. Coughing was noted and effective in expelling some of the aspirated material. Subsequent swallows were significant for prolonged mastication, and mildly reduced lingual retraction, but his swallow was notably more timely with self-feeding and no subsequent instances of penetration or aspiration were noted even when challenged with large sips and consecutive swallows. Mild to moderate vallecular residue was demonstrated with solids and was reduced to a functional level with a liquid wash. A dysphagia 3 diet with thin liquids is recommended at this time. Pt's swallow function and associated aspiration risk are much improved with self-feeding and  it is recommended that this be encouraged with supervision. SLP will  follow to ensure diet tolerance. SLP Visit Diagnosis Dysphagia, unspecified (R13.10) Attention and concentration deficit following -- Frontal lobe and executive function deficit following -- Impact on safety and function Moderate aspiration risk;Mild aspiration risk   CHL IP TREATMENT RECOMMENDATION 11/11/2020 Treatment Recommendations Therapy as outlined in treatment plan below   Prognosis 11/11/2020 Prognosis for Safe Diet Advancement Good Barriers to Reach Goals Cognitive deficits Barriers/Prognosis Comment -- CHL IP DIET RECOMMENDATION 11/11/2020 SLP Diet Recommendations Dysphagia 3 (Mech soft) solids;Thin liquid Liquid Administration via Cup;Straw Medication Administration Whole meds with liquid Compensations Slow rate;Small sips/bites Postural Changes Remain semi-upright after after feeds/meals (Comment);Seated upright at 90 degrees   CHL IP OTHER RECOMMENDATIONS 11/11/2020 Recommended Consults -- Oral Care Recommendations Oral care BID Other Recommendations --   CHL IP FOLLOW UP RECOMMENDATIONS 11/11/2020 Follow up Recommendations (No Data)   CHL IP FREQUENCY AND DURATION 11/11/2020 Speech Therapy Frequency (ACUTE ONLY) min 2x/week Treatment Duration 2 weeks      CHL IP ORAL PHASE 11/11/2020 Oral Phase Impaired Oral - Pudding Teaspoon -- Oral - Pudding Cup -- Oral - Honey Teaspoon -- Oral - Honey Cup -- Oral - Nectar Teaspoon -- Oral - Nectar Cup WFL Oral - Nectar Straw WFL Oral - Thin Teaspoon -- Oral - Thin Cup Decreased bolus cohesion;Premature spillage Oral - Thin Straw WFL Oral - Puree WFL Oral - Mech Soft WFL Oral - Regular WFL Oral - Multi-Consistency -- Oral - Pill WFL Oral Phase - Comment --  CHL IP PHARYNGEAL PHASE 11/11/2020 Pharyngeal Phase Impaired Pharyngeal- Pudding Teaspoon -- Pharyngeal -- Pharyngeal- Pudding Cup -- Pharyngeal -- Pharyngeal- Honey Teaspoon -- Pharyngeal -- Pharyngeal- Honey Cup -- Pharyngeal -- Pharyngeal- Nectar Teaspoon -- Pharyngeal -- Pharyngeal- Nectar Cup Reduced tongue base  retraction Pharyngeal -- Pharyngeal- Nectar Straw WFL Pharyngeal -- Pharyngeal- Thin Teaspoon -- Pharyngeal -- Pharyngeal- Thin Cup Reduced tongue base retraction;Delayed swallow initiation-pyriform sinuses;Pharyngeal residue - valleculae;Pharyngeal residue - pyriform;Penetration/Aspiration during swallow;Penetration/Apiration after swallow;Moderate aspiration Pharyngeal Material enters airway, passes BELOW cords and not ejected out despite cough attempt by patient Pharyngeal- Thin Straw -- Pharyngeal -- Pharyngeal- Puree -- Pharyngeal -- Pharyngeal- Mechanical Soft -- Pharyngeal -- Pharyngeal- Regular -- Pharyngeal -- Pharyngeal- Multi-consistency -- Pharyngeal -- Pharyngeal- Pill -- Pharyngeal -- Pharyngeal Comment --  No flowsheet data found. Scheryl Jeffery 11/11/2020, 3:52 PM              DG HIP UNILAT WITH PELVIS 2-3 VIEWS RIGHT  Result Date: 11/10/2020 CLINICAL DATA:  Found down, right hip tenderness EXAM: DG HIP (WITH OR WITHOUT PELVIS) 2-3V RIGHT COMPARISON:  None. FINDINGS: Frontal view of the pelvis including both hips as well as frontal and frogleg lateral views of the right hip are obtained. No fracture, subluxation, or dislocation. Joint spaces are well preserved. Soft tissues are normal. IMPRESSION: 1. No acute displaced fracture. Electronically Signed   By: Sharlet Salina M.D.   On: 11/10/2020 19:09   CT HEAD CODE STROKE WO CONTRAST  Result Date: 11/09/2020 CLINICAL DATA:  Code stroke. Initial evaluation for acute left-sided weakness, stroke suspected. EXAM: CT HEAD WITHOUT CONTRAST TECHNIQUE: Contiguous axial images were obtained from the base of the skull through the vertex without intravenous contrast. COMPARISON:  None available. FINDINGS: Brain: Age-related cerebral atrophy with chronic small vessel ischemic disease. Remote lacunar infarct present at the left caudate. Remote bilateral ACA territory infarcts noted. No acute intracranial hemorrhage. No acute large vessel territory infarct.  No  mass lesion, mass effect or midline shift. Mild ventricular prominence related to global parenchymal volume loss without hydrocephalus. No extra-axial fluid collection. Vascular: No hyperdense vessel. Skull: Scalp soft tissues demonstrate no acute finding. Small lipoma noted at the posterior scalp. Calvarium intact. Sinuses/Orbits: Globes and orbital soft tissues within normal limits. Chronic frontal sinusitis noted. Scattered mucosal thickening noted elsewhere within the sphenoid ethmoidal sinuses. No mastoid effusion. Other: None. ASPECTS [facility] Stroke Program Early CT Score) - Ganglionic level infarction (caudate, lentiform nuclei, internal capsule, insula, M1-M3 cortex): 7 - Supraganglionic infarction (M4-M6 cortex): 3 Total score (0-10 with 10 being normal): 10 IMPRESSION: 1. No acute intracranial infarct or other abnormality. 2. ASPECTS is 10. 3. Remote bilateral ACA territory infarcts, with additional remote lacunar infarct at the left basal ganglia. 4. Underlying atrophy with chronic small vessel ischemic disease. These results were communicated to Dr. Wilford Corner at 9:31 pmon 2/8/2022by text page via the [facility] messaging system. Electronically Signed   By: Rise Mu M.D.   On: 11/09/2020 21:51   ECHOCARDIOGRAM LIMITED  Result Date: 11/10/2020    ECHOCARDIOGRAM LIMITED REPORT   Patient Name:   KEIDAN AUMILLER  Date of Exam: 11/10/2020 Medical Rec #:  [id number]  Height:       72.0 in Accession #:    [id number] Weight:       217.6 lb Date of Birth:  03-16-1951 BSA:          2.208 m Patient Age:    69 years   BP:           138/94 mmHg Patient Gender: M          HR:           138 bpm. Exam Location:  Inpatient Procedure: Limited Echo, Cardiac Doppler and Color Doppler Indications:    Atrial fibrillation  History:        Patient has prior history of Echocardiogram examinations, most                 recent 07/19/2017. CHF; Arrythmias:Atrial Fibrillation. Dilated                 cardiomyopathy. CKD.   Sonographer:    Ross Ludwig RDCS (AE) Referring Phys: [id number] [facility]  Sonographer Comments: Suboptimal parasternal window. IMPRESSIONS  1. Left ventricular ejection fraction, by estimation, is 60 to 65%. The left ventricle has normal function. The left ventricle has no regional wall motion abnormalities. There is moderate left ventricular hypertrophy. Left ventricular diastolic parameters are indeterminate.  2. Right ventricular systolic function is normal. The right ventricular size is normal.  3. Left atrial size was mildly dilated.  4. The mitral valve is normal in structure. Trivial mitral valve regurgitation. No evidence of mitral stenosis.  5. The aortic valve was not well visualized. Aortic valve regurgitation is not visualized. No aortic stenosis is present.  6. The inferior vena cava is dilated in size with >50% respiratory variability, suggesting right atrial pressure of 8 mmHg. FINDINGS  Left Ventricle: Left ventricular ejection fraction, by estimation, is 60 to 65%. The left ventricle has normal function. The left ventricle has no regional wall motion abnormalities. The left ventricular internal cavity size was normal in size. There is  moderate left ventricular hypertrophy. Left ventricular diastolic parameters are indeterminate. Right Ventricle: The right ventricular size is normal. No increase in right ventricular wall thickness. Right ventricular systolic function is normal. Left Atrium: Left atrial size was mildly dilated. Right Atrium: Right atrial size  was normal in size. Pericardium: There is no evidence of pericardial effusion. Mitral Valve: The mitral valve is normal in structure. Trivial mitral valve regurgitation. No evidence of mitral valve stenosis. Tricuspid Valve: The tricuspid valve is normal in structure. Tricuspid valve regurgitation is mild . No evidence of tricuspid stenosis. Aortic Valve: The aortic valve was not well visualized. Aortic valve regurgitation is not  visualized. No aortic stenosis is present. Pulmonic Valve: The pulmonic valve was normal in structure. Pulmonic valve regurgitation is not visualized. No evidence of pulmonic stenosis. Aorta: The aortic root is normal in size and structure. Venous: The inferior vena cava is dilated in size with greater than 50% respiratory variability, suggesting right atrial pressure of 8 mmHg. IAS/Shunts: No atrial level shunt detected by color flow Doppler. LEFT VENTRICLE PLAX 2D LVIDd:         4.80 cm LVIDs:         3.40 cm LV PW:         1.60 cm LV IVS:        1.50 cm LVOT diam:     2.10 cm LVOT Area:     3.46 cm  IVC IVC diam: 1.70 cm LEFT ATRIUM         Index LA diam:    3.90 cm 1.77 cm/m   AORTA Ao Root diam: 3.60 cm  SHUNTS Systemic Diam: 2.10 cm Charlton Haws MD Electronically signed by Charlton Haws MD Signature Date/Time: 11/10/2020/3:42:53 PM    Final    [place] EKG SITE RITE  Result Date: 11/11/2020 If Site Rite image not attached, placement could not be confirmed due to current cardiac rhythm.

## 2020-11-15 NOTE — Progress Notes (Signed)
Physical Therapy Wound Treatment Patient Details  Name: Jeffery Christian MRN: [id number] Date of Birth: 1951/06/15  Today's Date: 11/15/2020 Time: 1002-1055 Time Calculation (min): 53 min  Subjective  Subjective: Pt more talkative today Patient and Family Stated Goals: did not state Prior Treatments: dressing change  Pain Score:  2/10  Wound Assessment  Pressure Injury Buttocks Right Unstageable - Full thickness tissue loss in which the base of the injury is covered by slough (yellow, tan, gray, green or brown) and/or eschar (tan, brown or black) in the wound bed. (Active)  Dressing Type ABD;Barrier Film (skin prep);Gauze (Comment);Santyl 11/15/20 1235  Dressing Changed;Clean;Dry;Intact 11/15/20 1235  Dressing Change Frequency Twice a day 11/15/20 1235  State of Healing Eschar 11/15/20 1235  Site / Wound Assessment Bleeding;Black;Yellow;Red 11/15/20 1235  % Wound base Red or Granulating 40% 11/15/20 1235  % Wound base Yellow/Fibrinous Exudate 50% 11/15/20 1235  % Wound base Black/Eschar 10% 11/15/20 1235  % Wound base Other/Granulation Tissue (Comment) 0% 11/15/20 1235  Peri-wound Assessment Pink;Maceration 11/15/20 1235  Wound Length (cm) 11 cm 11/13/20 1011  Wound Width (cm) 9 cm 11/13/20 1011  Wound Depth (cm) 3 cm 11/13/20 1011  Wound Surface Area (cm^2) 99 cm^2 11/13/20 1011  Wound Volume (cm^3) 297 cm^3 11/13/20 1011  Tunneling (cm) 0 11/12/20 0746  Undermining (cm) 0 11/12/20 0746  Margins Unattached edges (unapproximated) 11/15/20 1235  Drainage Amount Copious 11/15/20 1235  Drainage Description Sanguineous 11/15/20 1235  Treatment Debridement (Selective);Hydrotherapy (Pulse lavage);Packing (Saline gauze) 11/15/20 1235      Hydrotherapy Pulsed lavage therapy - wound location: R buttocks Pulsed Lavage with Suction (psi): 12 psi Pulsed Lavage with Suction - Normal Saline Used: 1000 mL Pulsed Lavage Tip: Tip with splash shield Selective Debridement Selective Debridement -  Location: R buttocks Selective Debridement - Tools Used: Forceps;Scalpel;Scissors Selective Debridement - Tissue Removed: yellow slough, eschar   Wound Assessment and Plan  Wound Therapy - Assess/Plan/Recommendations Wound Therapy - Clinical Statement: Pt with increased sanguineous drainage following debridement on 11/14/2019, making it difficult to visualized wound. Majority of black eschar now removed after initial debridement. Pt will benefit from hydrotherapy to perform selective debridement and decrease bioburden. Wound Therapy - Functional Problem List: immobility Factors Delaying/Impairing Wound Healing: Immobility Hydrotherapy Plan: Debridement;Patient/family education;Pulsatile lavage with suction Wound Therapy - Frequency: 6X / week Wound Therapy - Follow Up Recommendations: Skilled nursing facility Wound Plan: see above  Wound Therapy Goals- Improve the function of patient's integumentary system by progressing the wound(s) through the phases of wound healing (inflammation - proliferation - remodeling) by: Decrease Necrotic Tissue to: 60 Decrease Necrotic Tissue - Progress: Progressing toward goal Increase Granulation Tissue to: 40 Increase Granulation Tissue - Progress: Progressing toward goal Goals/treatment plan/discharge plan were made with and agreed upon by patient/family: Yes Time For Goal Achievement: 7 days Wound Therapy - Potential for Goals: Fair  Goals will be updated until maximal potential achieved or discharge criteria met.  Discharge criteria: when goals achieved, discharge from hospital, MD decision/surgical intervention, no progress towards goals, refusal/missing three consecutive treatments without notification or medical reason.  GP    Wyona Almas, PT, DPT Acute Rehabilitation Services Pager [phone] Office [phone]   Deno Etienne 11/15/2020, 12:41 PM

## 2020-11-15 NOTE — Progress Notes (Signed)
Physical Therapy Treatment Patient Details Name: Jeffery Christian MRN: [id number] DOB: 09-29-1951 Today's Date: 11/15/2020    History of Present Illness Pt is a 70 y.o. male admitted 11/09/20 after being found down at home by his friends (last seen by friends ~1 wk prior). Pt with L-side weakness, afib with RVR, AKI, metabolic encephalopathy, (+) COVID-19. Head CT/MRI negative for acute injury. CXR negative. EEG 2/11. within normal limits. Pt also with large, unstageable R buttock wound. PMH includes afib (on Eliquis), cardiomyopathy, CHF, CKD3.   PT Comments    Pt initially agreeable to mobilize with PT, but adamantly declining attempts to sit EOB or use of maximove for transfer to recliner. Pt able to perform some bed-level therex and allow assist with repositioning for pressure relief and midline/upright posture, pt then becoming upset and "aggravated" by these PT, stating, "I just want to be left alone..." Max encouragement and education on importance of mobility. Pt limited by apparent cognitive impairment with very poor awareness. Will require SNF-level therapies to maximize functional mobility and independence.   Follow Up Recommendations  SNF     Equipment Recommendations  Wheelchair (measurements PT);Wheelchair cushion (measurements PT);Rolling walker with 5" wheels;Hospital bed (hoyer lift)    Recommendations for Other Services       Precautions / Restrictions Precautions Precautions: Fall;Other (comment) Precaution Comments: R-side buttocks wound Restrictions Weight Bearing Restrictions: No    Mobility  Bed Mobility Overal bed mobility: Needs Assistance             General bed mobility comments: Pt initially agreeable to mobilize, but then adamantly declining attempt to sit EOB or use of maximove to recliner; assist to reposition trunk to midline as pt with L-lateral lean; R-side buttocks slightly offloaded, but pt declined to have pillow placed to further offload wound; LLE  repositioned to encourage neutral rotation, but then pt becoming upset and declined any additional repositioning/mobility    Transfers                    Ambulation/Gait                 Stairs             Wheelchair Mobility    Modified Rankin (Stroke Patients Only)       Balance                                            Cognition Arousal/Alertness: Awake/alert Behavior During Therapy: Flat affect;Agitated Overall Cognitive Status: No family/caregiver present to determine baseline cognitive functioning Area of Impairment: Orientation;Attention;Memory;Following commands;Safety/judgement;Awareness;Problem solving                 Orientation Level: Disoriented to;Place;Time;Situation Current Attention Level: Sustained   Following Commands: Follows one step commands inconsistently Safety/Judgement: Decreased awareness of deficits Awareness: Intellectual Problem Solving: Slow processing;Difficulty sequencing;Requires verbal cues;Requires tactile cues;Decreased initiation General Comments: Pt initially agreeable, but then reports, "You're aggravating me..." when asked why, pt states, "Because I don't want to be bothered." Pt initially performed bed-level therex and repositioning, then did not want PT touching him and threatened, "I'm going to punch you in the face." Pt difficult to reason with regarding importance of mobility. Appears to have little to no awareness of current situation      Exercises Other Exercises Other Exercises: Bilateral ankle pumps, AAROM LLE SLR and hip add (pt  then becoming upset and not allowing PT to assist him further)    General Comments        Pertinent Vitals/Pain Pain Assessment: Faces Faces Pain Scale: Hurts a little bit Pain Location: LLE with repositioning/PROM Pain Descriptors / Indicators: Grimacing Pain Intervention(s): Monitored during session;Limited activity within patient's  tolerance;Repositioned    Home Living                      Prior Function            PT Goals (current goals can now be found in the care plan section) Acute Rehab PT Goals Patient Stated Goal: "I want to be left alone" Progress towards PT goals: Not progressing toward goals - comment    Frequency    Min 2X/week      PT Plan Current plan remains appropriate    Co-evaluation              AM-PAC PT "6 Clicks" Mobility   Outcome Measure  Help needed turning from your back to your side while in a flat bed without using bedrails?: A Lot Help needed moving from lying on your back to sitting on the side of a flat bed without using bedrails?: Total Help needed moving to and from a bed to a chair (including a wheelchair)?: Total Help needed standing up from a chair using your arms (e.g., wheelchair or bedside chair)?: Total Help needed to walk in hospital room?: Total Help needed climbing 3-5 steps with a railing? : Total 6 Click Score: 7    End of Session   Activity Tolerance: Other (comment) (limited by cognition, self-limiting) Patient left: in bed;with call bell/phone within reach;with bed alarm set;Other (comment) (telesitter) Nurse Communication: Need for lift equipment PT Visit Diagnosis: Muscle weakness (generalized) (M62.81);History of falling (Z91.81);Difficulty in walking, not elsewhere classified (R26.2)     Time: 9024-0973 PT Time Calculation (min) (ACUTE ONLY): 14 min  Charges:  $Therapeutic Activity: 8-22 mins                     Ina Homes, PT, DPT Acute Rehabilitation Services  Pager [phone] Office [phone]  Malachy Chamber 11/15/2020, 4:58 PM

## 2020-11-16 DIAGNOSIS — Z79899 Other long term (current) drug therapy: Secondary | ICD-10-CM | POA: Diagnosis not present

## 2020-11-16 DIAGNOSIS — I4891 Unspecified atrial fibrillation: Secondary | ICD-10-CM | POA: Diagnosis not present

## 2020-11-16 DIAGNOSIS — U071 COVID-19: Secondary | ICD-10-CM | POA: Diagnosis not present

## 2020-11-16 DIAGNOSIS — Z1211 Encounter for screening for malignant neoplasm of colon: Secondary | ICD-10-CM | POA: Diagnosis not present

## 2020-11-16 DIAGNOSIS — Z8616 Personal history of COVID-19: Secondary | ICD-10-CM | POA: Diagnosis not present

## 2020-11-16 DIAGNOSIS — M79622 Pain in left upper arm: Secondary | ICD-10-CM | POA: Diagnosis not present

## 2020-11-16 DIAGNOSIS — R29898 Other symptoms and signs involving the musculoskeletal system: Secondary | ICD-10-CM | POA: Diagnosis not present

## 2020-11-16 DIAGNOSIS — R29818 Other symptoms and signs involving the nervous system: Secondary | ICD-10-CM | POA: Diagnosis not present

## 2020-11-16 DIAGNOSIS — R2681 Unsteadiness on feet: Secondary | ICD-10-CM | POA: Diagnosis not present

## 2020-11-16 DIAGNOSIS — I130 Hypertensive heart and chronic kidney disease with heart failure and stage 1 through stage 4 chronic kidney disease, or unspecified chronic kidney disease: Secondary | ICD-10-CM | POA: Diagnosis not present

## 2020-11-16 DIAGNOSIS — Z7901 Long term (current) use of anticoagulants: Secondary | ICD-10-CM | POA: Diagnosis not present

## 2020-11-16 DIAGNOSIS — R41841 Cognitive communication deficit: Secondary | ICD-10-CM | POA: Diagnosis not present

## 2020-11-16 DIAGNOSIS — G9341 Metabolic encephalopathy: Secondary | ICD-10-CM | POA: Diagnosis not present

## 2020-11-16 DIAGNOSIS — S0990XA Unspecified injury of head, initial encounter: Secondary | ICD-10-CM | POA: Diagnosis not present

## 2020-11-16 DIAGNOSIS — M1A0720 Idiopathic chronic gout, left ankle and foot, without tophus (tophi): Secondary | ICD-10-CM | POA: Diagnosis not present

## 2020-11-16 DIAGNOSIS — Z7401 Bed confinement status: Secondary | ICD-10-CM | POA: Diagnosis not present

## 2020-11-16 DIAGNOSIS — M6281 Muscle weakness (generalized): Secondary | ICD-10-CM | POA: Diagnosis not present

## 2020-11-16 DIAGNOSIS — M79632 Pain in left forearm: Secondary | ICD-10-CM | POA: Diagnosis not present

## 2020-11-16 DIAGNOSIS — L89159 Pressure ulcer of sacral region, unspecified stage: Secondary | ICD-10-CM | POA: Diagnosis not present

## 2020-11-16 DIAGNOSIS — D509 Iron deficiency anemia, unspecified: Secondary | ICD-10-CM | POA: Diagnosis not present

## 2020-11-16 DIAGNOSIS — I129 Hypertensive chronic kidney disease with stage 1 through stage 4 chronic kidney disease, or unspecified chronic kidney disease: Secondary | ICD-10-CM | POA: Diagnosis not present

## 2020-11-16 DIAGNOSIS — R609 Edema, unspecified: Secondary | ICD-10-CM | POA: Diagnosis not present

## 2020-11-16 DIAGNOSIS — J069 Acute upper respiratory infection, unspecified: Secondary | ICD-10-CM | POA: Diagnosis not present

## 2020-11-16 DIAGNOSIS — R1312 Dysphagia, oropharyngeal phase: Secondary | ICD-10-CM | POA: Diagnosis not present

## 2020-11-16 DIAGNOSIS — M1A09X0 Idiopathic chronic gout, multiple sites, without tophus (tophi): Secondary | ICD-10-CM | POA: Diagnosis not present

## 2020-11-16 DIAGNOSIS — R262 Difficulty in walking, not elsewhere classified: Secondary | ICD-10-CM | POA: Diagnosis not present

## 2020-11-16 DIAGNOSIS — I4819 Other persistent atrial fibrillation: Secondary | ICD-10-CM | POA: Diagnosis not present

## 2020-11-16 DIAGNOSIS — L89310 Pressure ulcer of right buttock, unstageable: Secondary | ICD-10-CM | POA: Diagnosis not present

## 2020-11-16 DIAGNOSIS — L0231 Cutaneous abscess of buttock: Secondary | ICD-10-CM | POA: Diagnosis not present

## 2020-11-16 DIAGNOSIS — M1A9XX0 Chronic gout, unspecified, without tophus (tophi): Secondary | ICD-10-CM | POA: Diagnosis not present

## 2020-11-16 DIAGNOSIS — N183 Chronic kidney disease, stage 3 unspecified: Secondary | ICD-10-CM | POA: Diagnosis not present

## 2020-11-16 DIAGNOSIS — R279 Unspecified lack of coordination: Secondary | ICD-10-CM | POA: Diagnosis not present

## 2020-11-16 DIAGNOSIS — L089 Local infection of the skin and subcutaneous tissue, unspecified: Secondary | ICD-10-CM | POA: Diagnosis not present

## 2020-11-16 DIAGNOSIS — R339 Retention of urine, unspecified: Secondary | ICD-10-CM | POA: Diagnosis not present

## 2020-11-16 DIAGNOSIS — S0083XA Contusion of other part of head, initial encounter: Secondary | ICD-10-CM | POA: Diagnosis not present

## 2020-11-16 DIAGNOSIS — M8618 Other acute osteomyelitis, other site: Secondary | ICD-10-CM | POA: Diagnosis not present

## 2020-11-16 DIAGNOSIS — I420 Dilated cardiomyopathy: Secondary | ICD-10-CM | POA: Diagnosis not present

## 2020-11-16 DIAGNOSIS — N1831 Chronic kidney disease, stage 3a: Secondary | ICD-10-CM | POA: Diagnosis not present

## 2020-11-16 DIAGNOSIS — T148XXA Other injury of unspecified body region, initial encounter: Secondary | ICD-10-CM | POA: Diagnosis not present

## 2020-11-16 DIAGNOSIS — R4189 Other symptoms and signs involving cognitive functions and awareness: Secondary | ICD-10-CM | POA: Diagnosis not present

## 2020-11-16 DIAGNOSIS — N3001 Acute cystitis with hematuria: Secondary | ICD-10-CM | POA: Diagnosis not present

## 2020-11-16 DIAGNOSIS — R5381 Other malaise: Secondary | ICD-10-CM | POA: Diagnosis not present

## 2020-11-16 DIAGNOSIS — L89153 Pressure ulcer of sacral region, stage 3: Secondary | ICD-10-CM | POA: Diagnosis not present

## 2020-11-16 DIAGNOSIS — Z20818 Contact with and (suspected) exposure to other bacterial communicable diseases: Secondary | ICD-10-CM | POA: Diagnosis not present

## 2020-11-16 DIAGNOSIS — M4628 Osteomyelitis of vertebra, sacral and sacrococcygeal region: Secondary | ICD-10-CM | POA: Diagnosis not present

## 2020-11-16 DIAGNOSIS — I5022 Chronic systolic (congestive) heart failure: Secondary | ICD-10-CM | POA: Diagnosis not present

## 2020-11-16 DIAGNOSIS — R404 Transient alteration of awareness: Secondary | ICD-10-CM | POA: Diagnosis not present

## 2020-11-16 DIAGNOSIS — N390 Urinary tract infection, site not specified: Secondary | ICD-10-CM | POA: Diagnosis not present

## 2020-11-16 DIAGNOSIS — M2550 Pain in unspecified joint: Secondary | ICD-10-CM | POA: Diagnosis not present

## 2020-11-16 DIAGNOSIS — Z23 Encounter for immunization: Secondary | ICD-10-CM | POA: Diagnosis not present

## 2020-11-16 DIAGNOSIS — D649 Anemia, unspecified: Secondary | ICD-10-CM | POA: Diagnosis not present

## 2020-11-16 DIAGNOSIS — I959 Hypotension, unspecified: Secondary | ICD-10-CM | POA: Diagnosis not present

## 2020-11-16 DIAGNOSIS — L89314 Pressure ulcer of right buttock, stage 4: Secondary | ICD-10-CM | POA: Diagnosis not present

## 2020-11-16 DIAGNOSIS — G4489 Other headache syndrome: Secondary | ICD-10-CM | POA: Diagnosis not present

## 2020-11-16 DIAGNOSIS — W06XXXA Fall from bed, initial encounter: Secondary | ICD-10-CM | POA: Diagnosis not present

## 2020-11-16 DIAGNOSIS — N179 Acute kidney failure, unspecified: Secondary | ICD-10-CM | POA: Diagnosis not present

## 2020-11-16 DIAGNOSIS — L89319 Pressure ulcer of right buttock, unspecified stage: Secondary | ICD-10-CM | POA: Diagnosis not present

## 2020-11-16 DIAGNOSIS — R569 Unspecified convulsions: Secondary | ICD-10-CM | POA: Diagnosis not present

## 2020-11-16 DIAGNOSIS — W19XXXA Unspecified fall, initial encounter: Secondary | ICD-10-CM | POA: Diagnosis not present

## 2020-11-16 DIAGNOSIS — E870 Hyperosmolality and hypernatremia: Secondary | ICD-10-CM | POA: Diagnosis not present

## 2020-11-16 DIAGNOSIS — Z743 Need for continuous supervision: Secondary | ICD-10-CM | POA: Diagnosis not present

## 2020-11-16 DIAGNOSIS — I10 Essential (primary) hypertension: Secondary | ICD-10-CM | POA: Diagnosis not present

## 2020-11-16 LAB — CBC
HCT: 31.5 % — ABNORMAL LOW (ref 39.0–52.0)
Hemoglobin: 10.5 g/dL — ABNORMAL LOW (ref 13.0–17.0)
MCH: 28.4 pg (ref 26.0–34.0)
MCHC: 33.3 g/dL (ref 30.0–36.0)
MCV: 85.1 fL (ref 80.0–100.0)
Platelets: 225 10*3/uL (ref 150–400)
RBC: 3.7 MIL/uL — ABNORMAL LOW (ref 4.22–5.81)
RDW: 15.9 % — ABNORMAL HIGH (ref 11.5–15.5)
WBC: 10.1 10*3/uL (ref 4.0–10.5)
nRBC: 0 % (ref 0.0–0.2)

## 2020-11-16 LAB — BASIC METABOLIC PANEL
Anion gap: 6 (ref 5–15)
BUN: 24 mg/dL — ABNORMAL HIGH (ref 8–23)
CO2: 21 mmol/L — ABNORMAL LOW (ref 22–32)
Calcium: 8 mg/dL — ABNORMAL LOW (ref 8.9–10.3)
Chloride: 114 mmol/L — ABNORMAL HIGH (ref 98–111)
Creatinine, Ser: 1.22 mg/dL (ref 0.61–1.24)
GFR, Estimated: >60 mL/min (ref >60)
Glucose, Bld: 118 mg/dL — ABNORMAL HIGH (ref 70–99)
Potassium: 3.8 mmol/L (ref 3.5–5.1)
Sodium: 141 mmol/L (ref 135–145)

## 2020-11-16 MED ORDER — PANTOPRAZOLE SODIUM 40 MG PO TBEC: 40.0000 mg | DELAYED_RELEASE_TABLET | Freq: Every day | ORAL | Status: AC

## 2020-11-16 MED ORDER — METOPROLOL TARTRATE 25 MG PO TABS: 25.0000 mg | ORAL_TABLET | Freq: Two times a day (BID) | ORAL | Status: DC

## 2020-11-16 MED ORDER — COLLAGENASE 250 UNIT/GM EX OINT: TOPICAL_OINTMENT | Freq: Every day | CUTANEOUS | 0 refills | Status: DC

## 2020-11-16 MED ORDER — MIDODRINE HCL 5 MG PO TABS
2.5000 mg | ORAL_TABLET | Freq: Three times a day (TID) | ORAL | Status: DC
  Administered 2020-11-16 (×3): 2.5 mg via ORAL
  Filled 2020-11-16 (×3): qty 1

## 2020-11-16 MED ORDER — BACITRACIN-NEOMYCIN-POLYMYXIN OINTMENT TUBE: 1.0000 "application " | TOPICAL_OINTMENT | Freq: Two times a day (BID) | CUTANEOUS | Status: DC

## 2020-11-16 MED ORDER — MIDODRINE HCL 2.5 MG PO TABS: 2.5000 mg | ORAL_TABLET | Freq: Three times a day (TID) | ORAL | Status: DC

## 2020-11-16 MED ORDER — TAMSULOSIN HCL 0.4 MG PO CAPS: 0.4000 mg | ORAL_CAPSULE | Freq: Every day | ORAL | Status: AC

## 2020-11-16 MED ORDER — ENSURE ENLIVE PO LIQD: 237.0000 mL | Freq: Two times a day (BID) | ORAL | 12 refills | Status: DC

## 2020-11-16 MED ORDER — SILVER NITRATE-POT NITRATE 75-25 % EX MISC
1.0000 "application " | Freq: Once | CUTANEOUS | Status: AC
  Administered 2020-11-16: 1 via TOPICAL
  Filled 2020-11-16: qty 1

## 2020-11-16 MED ORDER — ACETAMINOPHEN 650 MG RE SUPP: 650.0000 mg | Freq: Four times a day (QID) | RECTAL | 0 refills | Status: DC | PRN

## 2020-11-16 NOTE — Care Management Important Message (Signed)
Important Message  Patient Details  Name: Jeffery Christian MRN: [id number] Date of Birth: 14-Jun-1951   Medicare Important Message Given:  Yes - Important Message mailed due to current National Emergency  Verbal consent obtained due to current National Emergency  Relationship to patient: Self Contact Name: Erron Call Date: 11/16/20  Time: 1530 Phone: [phone] Outcome: No Answer/Busy Important Message mailed to: Patient address on file    Orson Aloe 11/16/2020, 3:31 PM

## 2020-11-16 NOTE — TOC Progression Note (Signed)
Transition of Care [facility]) - Progression Note    Patient Details  Name: Jeffery Christian MRN: [id number] Date of Birth: 10-18-1950  [facility] CM/SW Contact  Mearl Latin, [place] Phone Number: 11/16/2020, 12:00 PM  Clinical Narrative:    [facility] able [facility] patient today. CSW confirmed patient's address with patient's daughter: [address], [place].    Expected Discharge Plan: Skilled Nursing Facility Barriers to Discharge: Barriers Resolved  Expected Discharge Plan and Services Expected Discharge Plan: Skilled Nursing Facility In-house Referral: Clinical Social Work   Post Acute Care Choice: Skilled Nursing Facility Living arrangements for the past 2 months: Single Family Home                 DME Arranged: N/A DME Agency: NA       HH Arranged: NA HH Agency: NA         Social Determinants of Health (SDOH) Interventions    Readmission Risk Interventions No flowsheet data found.

## 2020-11-16 NOTE — Discharge Instructions (Signed)
Person Under Monitoring Name: Jeffery Christian  Location: [address] [address] [facility] [address]   Infection Prevention Recommendations for Individuals Confirmed to have, or Being Evaluated for, 2019 Novel Coronavirus (COVID-19) Infection Who Receive Care at Home  Individuals who are confirmed to have, or are being evaluated for, COVID-19 should follow the prevention steps below until a healthcare provider or local or state health department says they can return to normal activities.  Stay home except to get medical care You should restrict activities outside your home, except for getting medical care. Do not go to work, school, or public areas, and do not use public transportation or taxis.  Call ahead before visiting your doctor Before your medical appointment, call the healthcare provider and tell them that you have, or are being evaluated for, COVID-19 infection. This will help the healthcare provider's office take steps to keep other people from getting infected. Ask your healthcare provider to call the local or state health department.  Monitor your symptoms Seek prompt medical attention if your illness is worsening (e.g., difficulty breathing). Before going to your medical appointment, call the healthcare provider and tell them that you have, or are being evaluated for, COVID-19 infection. Ask your healthcare provider to call the local or state health department.  Wear a facemask You should wear a facemask that covers your nose and mouth when you are in the same room with other people and when you visit a healthcare provider. People who live with or visit you should also wear a facemask while they are in the same room with you.  Separate yourself from other people in your home As much as possible, you should stay in a different room from other people in your home. Also, you should use a separate bathroom, if available.  Avoid sharing household items You should not share  dishes, drinking glasses, cups, eating utensils, towels, bedding, or other items with other people in your home. After using these items, you should wash them thoroughly with soap and water.  Cover your coughs and sneezes Cover your mouth and nose with a tissue when you cough or sneeze, or you can cough or sneeze into your sleeve. Throw used tissues in a lined trash can, and immediately wash your hands with soap and water for at least 20 seconds or use an alcohol-based hand rub.  Wash your [employer] your hands often and thoroughly with soap and water for at least 20 seconds. You can use an alcohol-based hand sanitizer if soap and water are not available and if your hands are not visibly dirty. Avoid touching your eyes, nose, and mouth with unwashed hands.   Prevention Steps for Caregivers and Household Members of Individuals Confirmed to have, or Being Evaluated for, COVID-19 Infection Being Cared for in the Home  If you live with, or provide care at home for, a person confirmed to have, or being evaluated for, COVID-19 infection please follow these guidelines to prevent infection:  Follow healthcare provider's instructions Make sure that you understand and can help the patient follow any healthcare provider instructions for all care.  Provide for the patient's basic needs You should help the patient with basic needs in the home and provide support for getting groceries, prescriptions, and other personal needs.  Monitor the patient's symptoms If they are getting sicker, call his or her medical provider and tell them that the patient has, or is being evaluated for, COVID-19 infection. This will help the healthcare provider's office take  steps to keep other people from getting infected. Ask the healthcare provider to call the local or state health department.  Limit the number of people who have contact with the patient  If possible, have only one caregiver for the patient.  Other  household members should stay in another home or place of residence. If this is not possible, they should stay  in another room, or be separated from the patient as much as possible. Use a separate bathroom, if available.  Restrict visitors who do not have an essential need to be in the home.  Keep older adults, very young children, and other sick people away from the patient Keep older adults, very young children, and those who have compromised immune systems or chronic health conditions away from the patient. This includes people with chronic heart, lung, or kidney conditions, diabetes, and cancer.  Ensure good ventilation Make sure that shared spaces in the home have good air flow, such as from an air conditioner or an opened window, weather permitting.  Wash your hands often  Wash your hands often and thoroughly with soap and water for at least 20 seconds. You can use an alcohol based hand sanitizer if soap and water are not available and if your hands are not visibly dirty.  Avoid touching your eyes, nose, and mouth with unwashed hands.  Use disposable paper towels to dry your hands. If not available, use dedicated cloth towels and replace them when they become wet.  Wear a facemask and gloves  Wear a disposable facemask at all times in the room and gloves when you touch or have contact with the patient's blood, body fluids, and/or secretions or excretions, such as sweat, saliva, sputum, nasal mucus, vomit, urine, or feces.  Ensure the mask fits over your nose and mouth tightly, and do not touch it during use.  Throw out disposable facemasks and gloves after using them. Do not reuse.  Wash your hands immediately after removing your facemask and gloves.  If your personal clothing becomes contaminated, carefully remove clothing and launder. Wash your hands after handling contaminated clothing.  Place all used disposable facemasks, gloves, and other waste in a lined container before  disposing them with other household waste.  Remove gloves and wash your hands immediately after handling these items.  Do not share dishes, glasses, or other household items with the patient  Avoid sharing household items. You should not share dishes, drinking glasses, cups, eating utensils, towels, bedding, or other items with a patient who is confirmed to have, or being evaluated for, COVID-19 infection.  After the person uses these items, you should wash them thoroughly with soap and water.  Wash laundry thoroughly  Immediately remove and wash clothes or bedding that have blood, body fluids, and/or secretions or excretions, such as sweat, saliva, sputum, nasal mucus, vomit, urine, or feces, on them.  Wear gloves when handling laundry from the patient.  Read and follow directions on labels of laundry or clothing items and detergent. In general, wash and dry with the warmest temperatures recommended on the label.  Clean all areas the individual has used often  Clean all touchable surfaces, such as counters, tabletops, doorknobs, bathroom fixtures, toilets, phones, keyboards, tablets, and bedside tables, every day. Also, clean any surfaces that may have blood, body fluids, and/or secretions or excretions on them.  Wear gloves when cleaning surfaces the patient has come in contact with.  Use a diluted bleach solution (e.g., dilute bleach with 1 part bleach  and 10 parts water) or a household disinfectant with a label that says EPA-registered for coronaviruses. To make a bleach solution at home, add 1 tablespoon of bleach to 1 quart (4 cups) of water. For a larger supply, add  cup of bleach to 1 gallon (16 cups) of water.  Read labels of cleaning products and follow recommendations provided on product labels. Labels contain instructions for safe and effective use of the cleaning product including precautions you should take when applying the product, such as wearing gloves or eye protection  and making sure you have good ventilation during use of the product.  Remove gloves and wash hands immediately after cleaning.  Monitor yourself for signs and symptoms of illness Caregivers and household members are considered close contacts, should monitor their health, and will be asked to limit movement outside of the home to the extent possible. Follow the monitoring steps for close contacts listed on the symptom monitoring form.   ? If you have additional questions, contact your local health department or call the epidemiologist on call at [phone] (available 24/7). ? This guidance is subject to change. For the most up-to-date guidance from [facility], please refer to their website: [email or web address].pl

## 2020-11-16 NOTE — TOC Transition Note (Signed)
Transition of Care [facility]) - CM/SW Discharge Note   Patient Details  Name: Jeffery Christian MRN: [id number] Date of Birth: 28-Jan-1951  [facility] CM/SW Contact:  Torben Soloway, LCSWA Phone Number: 11/16/2020, 2:08 PM   Clinical Narrative:    Patient will DC to: [facility] Anticipated DC date: 11/16/20 Family notified: Yes, patient's daughter, [facility] Transport by: Sharin Mons   Per MD patient ready for DC to [facility]. RN to call report prior to discharge [phone] [id number]). RN, patient, patient's family, and facility notified of DC. Discharge Summary and FL2 sent to facility. DC packet on chart. Ambulance transport requested for patient.   CSW will sign off for now as social work intervention is no longer needed. Please consult [place] again if new needs arise.      Final next level of care: Skilled Nursing Facility Barriers to Discharge: Barriers Resolved   Patient Goals and CMS Choice Patient states their goals for this hospitalization and ongoing recovery are:: Rehab CMS Medicare.gov Compare Post Acute Care list provided to:: Other (Comment Required) (Patients Daughter, [facility]) Choice offered to / list presented to : Adult Children  Discharge Placement PASRR number recieved: 11/16/20 Existing PASRR number confirmed : 11/16/20          Patient [facility] bed at: [facility] Patient to be transferred to facility by: [facility] Name of family member notified: Patients Daughter, [facility] Patient and family notified of of transfer: 11/16/20  Discharge Plan and Services In-house Referral: Clinical Social Work   Post Acute Care Choice: Skilled Nursing Facility          DME Arranged: N/A DME Agency: NA       HH Arranged: NA HH Agency: NA        Social Determinants of Health (SDOH) Interventions     Readmission Risk Interventions No flowsheet data found.

## 2020-11-16 NOTE — Progress Notes (Signed)
Report given to [facility]. Awaiting [facility] arrival.

## 2020-11-16 NOTE — Discharge Summary (Signed)
Jeffery Christian, is a 70 y.o. male  DOB 18-Nov-1950  MRN [id number].  Admission date:  11/09/2020  Admitting Physician  Carlton Adam, MD  Discharge Date:  11/16/2020   Primary MD  Hoy Register, MD  Recommendations for primary care physician for things to follow:  -Recheck CBC, CMP in 3 days. -Patient need to follow with urology as an outpatient regarding wound at glans penis, and urinary retention, please attempt voiding trial and 5 days. -Please keep right arm elevated until swelling has subsided.   Admission Diagnosis  Trauma [T14.90XA] AKI (acute kidney injury) (HCC) [N17.9] Atrial fibrillation with RVR (HCC) [I48.91] Altered mental status, unspecified altered mental status type [R41.82] Atrial fibrillation, unspecified type [facility]) [I48.91]   Discharge Diagnosis  Trauma [T14.90XA] AKI (acute kidney injury) (HCC) [N17.9] Atrial fibrillation with RVR (HCC) [I48.91] Altered mental status, unspecified altered mental status type [R41.82] Atrial fibrillation, unspecified type (HCC) [I48.91]    Principal Problem:   Atrial fibrillation with RVR (HCC) Active Problems:   Acute renal failure superimposed on stage 3 chronic kidney disease (HCC)   Acute metabolic encephalopathy   Hypernatremia   Elevated troponin   COVID-19 virus infection      Past Medical History:  Diagnosis Date  . Chronic systolic CHF (congestive heart failure) (HCC)    EF 35-40, diffuse HK, mild MR, moderate LAE, mild RAE, PASP 44, L pleural eff  . Dilated cardiomyopathy (HCC)    likely related to tachycardia  . Persistent atrial fibrillation [facility])     Past Surgical History:  Procedure Laterality Date  . CARDIOVERSION N/A 08/20/2017   Procedure: CARDIOVERSION;  Surgeon: [name], MD;  Location: [facility] ENDOSCOPY;  Service: Cardiovascular;  Laterality: N/A;  . INCISION AND DRAINAGE ABSCESS Left 03/22/2014   Procedure:  INCISION AND DRAINAGE ABSCESS LEFT BUTTOCK ABSCESS;  Surgeon: [name], MD;  Location: [facility] OR;  Service: General;  Laterality: Left;       History of present illness and  Hospital Course:     Kindly see H&P for history of present illness and admission details, please review complete Labs, Consult reports and Test reports for all details in brief  HPI  from the history and physical done on the day of admission 11/09/2020   HPI: Jeffery Christian is a 70 y.o. male with medical history significant for atrial fibrillation on Eliquis-last dose unknown, dilated cardiomyopathy, chronic systolic CHF, CKD 3 who presents by EMS after being found on the floor at home by his friends.  He was last seen about 1 week ago by his friends.  His friends not heard from him in the last week so they went to his house to check on him and upon getting no response at his door, knocked the door down and found him down on the ground between a dresser in the bed.  He was not talking at the time. He was weak all over but the EMTs noted him to be weaker on the left in comparison to the right and brought  to ER as a code stroke.  He was unable to provide any history. He was able to follow simple commands He had a strong smell of urine and EMTs reported urine and stool around him when they picked him up from his house.  ED Course: He was found to have elevated heart rate in the 1 60-1 80 range.  EKG showed atrial fibrillation with RVR.  He was started on Cardizem infusion in the emergency room but continues to have elevated heart rate.  CT the head was negative for acute stroke.  Neurology was consulted and has evaluated patient.  Patient was found to be hypernatremic on labs with sodium of 168. Troponin was elevated at 56  Hospital Course    Hypernatremia -Severe, due to hypovolemia, in the setting of volume depletion and dehydration. -It was corrected gradually, currently within normal limit, he has good oral intake and  fluid intake currently.Marland Kitchen  COVID-19 infection -Patient tells me he is vaccinated earlier last year, but not boosted -No evidence of pneumonia on x-ray, no  oxygen requirement -Treated with remdesivir x3 days, there was no indication for steroids -Will need isolation for 10 days from initial diagnosis need to  Acute metabolic encephalopathy: -Significantly improved -MRI incomplete study, but with no evidence of acute CVA or acute findings. -as well in the setting of hypernatremia and UTI - EEG within normal limits, no seizures or epileptiform discharges -I have discussed with daughter, there is no history of alcohol or drug abuse -Mentation has significantly improved.  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 -Followed by PT/OT, recommendation for SNF placement  AKI on CKD stageIII: -Due to volume depletion and dehydration -CK within normal limit. -Renal ultrasound with no hydronephrosis -Improving  A fib with RVR -Initially on amiodarone drip, but with poor control, transitioned to Cardizem drip, this has significantly improved, Cardizem drip discontinued , heart rate controlled on Toprol all -Continue with Eliquis  Hypertension:  -Old blood pressure is soft, he has been cut to metoprolol mainly for heart rate control, he is started on low-dose midodrine.  Hyperkalemia:  -Improving  Elevated ALT -Mild, due to Covid,   UTI -Continue with Rocephin x3 days, urine culture unhelpful as growing multiple species.  Right gluteal unstageable pressure ulcer -This is present on admission, and right buttock area, as he was on the floor for unknown period of time, wound care has been consulted, he was treated with  hydrotherapy, eschar has sloughed, continue with wound care.  Pressure Injury Buttocks Right Unstageable - Full thickness tissue loss in which the base of the injury is covered by slough (yellow, tan, gray, green or brown) and/or eschar (tan, brown or black) in the wound bed. (Active)     Location: Buttocks  Location Orientation: Right  Staging: Unstageable - Full thickness tissue loss in which the base of the injury is covered by slough (yellow, tan, gray, green or brown) and/or eschar (tan, brown or black) in the wound bed.  Wound Description (Comments):   Present on Admission: Yes   Wounds at glans penis, around Foley insertion site -Discussed with urology Dr. Liliane Shi, he recommends local wound care including Neosporin and washing with soap and water, and outpatient follow-up with him once stable.  Wound has significantly improved.  Urinary retention -Quired Foley catheter  on admission, he failed voiding trial again on 2/12, Foley catheter was reinserted 2/13, he will be discharged on Foley, he is started on Flomax, will need another voiding trial in 5 to 7 days.  Right arm swelling due to infiltration at IV site -No evidence of infection, continue with right arm elevation, swelling has significantly subsided.      Discharge Condition:  stable - D/W daughter [place] by phone.  Follow UP   Contact information for follow-up providers    Rene Paci, MD Follow up in 1 week(s).   Specialty: Urology Contact information: [address] [place] [address] [phone]            Contact information for after-discharge care    Destination    [facility] Preferred SNF .   Service: Skilled Nursing Contact information: [address] [place] [address] [phone]                    Discharge Instructions  and  Discharge Medications    Discharge Instructions    Diet - low sodium heart healthy    Complete by: As directed    Discharge instructions   Complete by: As directed    Follow with SNF physician  Get CBC, CMP, checked  by Primary MD next visit.    Activity: As tolerated with Full fall precautions use walker/cane & assistance as needed   Disposition SNF   Diet: DYS 3 THIN LIQUID, with feeding assistance and aspiration precautions.    On your next visit with your primary care physician please Get Medicines reviewed and adjusted.   Please request your Prim.MD to go over all Hospital Tests and Procedure/Radiological results at the follow up, please get all Hospital records sent to your Prim MD by signing hospital release before you go home.   If you experience worsening of your admission symptoms, develop shortness of breath, life threatening emergency, suicidal or homicidal thoughts you must seek medical attention immediately by calling 911 or calling your MD immediately  if symptoms less severe.  You Must read complete instructions/literature along with all the possible adverse reactions/side effects for all the Medicines you take and that have been prescribed to you. Take any new Medicines after you have completely understood and accpet all the possible adverse reactions/side effects.   Do not drive, operating heavy machinery, perform activities at heights, swimming or participation in water activities or provide baby sitting services if your were admitted for syncope or siezures until you have seen by Primary MD or a Neurologist and advised to do so again.  Do not drive when taking Pain medications.    Do not take more than prescribed Pain, Sleep and Anxiety Medications  Special Instructions: If you have smoked or chewed Tobacco  in the last 2 yrs please stop smoking, stop any regular Alcohol  and or any Recreational drug use.  Wear Seat belts while driving.   Please note  You were cared for by a hospitalist during your hospital stay. If you have any  questions about your discharge medications or the care you received while you were in the hospital after you are discharged, you can call the unit and asked to speak with the hospitalist on call if the hospitalist that took care of you is not available. Once you are discharged, your primary care physician will handle any further medical issues. Please note that NO REFILLS for any discharge medications will  be authorized once you are discharged, as it is imperative that you return to your primary care physician (or establish a relationship with a primary care physician if you do not have one) for your aftercare needs so that they can reassess your need for medications and monitor your lab values.   Discharge wound care:   Complete by: As directed    For wound at Glans Penis: please wash  with soap and water, then apply topical Neosporin twice daily  For Right Buttock wound: Apply Santyl to right buttock wound in a nickel thick layer. Cover with a saline moistened gauze, then dry gauze or ABD pad.  Change daily.   Increase activity slowly   Complete by: As directed      Allergies as of 11/16/2020   No Known Allergies     Medication List    STOP taking these medications   ciprofloxacin 500 MG tablet Commonly known as: Cipro   furosemide 20 MG tablet Commonly known as: LASIX   metoprolol succinate 25 MG 24 hr tablet Commonly known as: TOPROL-XL   predniSONE 20 MG tablet Commonly known as: DELTASONE     TAKE these medications   acetaminophen 650 MG suppository Commonly known as: TYLENOL Place 1 suppository (650 mg total) rectally every 6 (six) hours as needed for fever.   allopurinol 300 MG tablet Commonly known as: ZYLOPRIM Take 1 tablet (300 mg total) by mouth 2 (two) times daily.   apixaban 5 MG Tabs tablet Commonly known as: ELIQUIS Take 1 tablet (5 mg total) by mouth 2 (two) times daily. What changed: additional instructions   atorvastatin 20 MG tablet Commonly known as:  LIPITOR Take 1 tablet (20 mg total) by mouth daily.   colchicine 0.6 MG tablet Take 2 tabs (1.2 mg) at the onset of a gout flare and repeat 1 tab (0.6 mg) in 1 hour if symptoms persist   collagenase ointment Commonly known as: SANTYL Apply topically daily. Apply to gluteal pressure ulcers daily. Start taking on: November 17, 2020   feeding supplement Liqd Take 237 mLs by mouth 2 (two) times daily between meals.   metoprolol tartrate 25 MG tablet Commonly known as: LOPRESSOR Take 1 tablet (25 mg total) by mouth 2 (two) times daily.   midodrine 2.5 MG tablet Commonly known as: PROAMATINE Take 1 tablet (2.5 mg total) by mouth 3 (three) times daily with meals.   neomycin-bacitracin-polymyxin Oint Commonly known as: NEOSPORIN Apply 1 application topically 2 (two) times daily. Please apply to glans penis wound BID.   pantoprazole 40 MG tablet Commonly known as: PROTONIX Take 1 tablet (40 mg total) by mouth daily.   tamsulosin 0.4 MG Caps capsule Commonly known as: FLOMAX Take 1 capsule (0.4 mg total) by mouth daily after supper.            Discharge Care Instructions  (From admission, onward)         Start     Ordered   11/16/20 0000  Discharge wound care:       Comments: For wound at Glans Penis: please wash  with soap and water, then apply topical Neosporin twice daily  For Right Buttock wound: Apply Santyl to right buttock wound in a nickel thick layer. Cover with a saline moistened gauze, then dry gauze or ABD pad.  Change daily.   11/16/20 1347            Diet and Activity recommendation: See Discharge Instructions above   Consults obtained -  Nephrology Neurology D/W urology  Dr. Liliane Shi   Major procedures and Radiology Reports - PLEASE review detailed and final reports for all details, in brief -      EEG  Result Date: 11/12/2020 Jeffery Quest, MD     11/12/2020 10:37 AM Patient Name: Jeffery Christian MRN: [id number] Epilepsy Attending: Charlsie Christian Referring Physician/Provider: Dr Caryl Pina Date: 11/12/2020 Duration: 23.19 mins Patient history: 70 yo M with ams. EEG to evaluate for seizure Level of alertness: Awake, drowsy, sleep, comatose, lethargic AEDs during EEG study: None Technical aspects: This EEG study was done with scalp electrodes positioned according to the 10-20 International system of electrode placement. Electrical activity was acquired at a sampling rate of 500Hz  and reviewed with a high frequency filter of 70Hz  and a low frequency filter of 1Hz . EEG data were recorded continuously and digitally stored. Description: The posterior dominant rhythm consists of 8-9 Hz activity of moderate voltage (25-35 uV) seen predominantly in posterior head regions, symmetric and reactive to eye opening and eye closing. Hyperventilation and photic stimulation were not performed.   IMPRESSION: This study is within normal limits. No seizures or epileptiform discharges were seen throughout the recording. Jeffery Christian   CT Code Stroke CTA Head W/WO contrast  Result Date: 11/09/2020 CLINICAL DATA:  Initial evaluation for acute stroke, left-sided weakness. EXAM: CT ANGIOGRAPHY HEAD AND NECK CT PERFUSION BRAIN TECHNIQUE: Multidetector CT imaging of the head and neck was performed using the standard protocol during bolus administration of intravenous contrast. Multiplanar CT image reconstructions and MIPs were obtained to evaluate the vascular anatomy. Carotid stenosis measurements (when applicable) are obtained utilizing NASCET criteria, using the distal internal carotid diameter as the denominator. Multiphase CT imaging of the brain was performed following IV bolus contrast injection. Subsequent parametric perfusion maps were calculated using RAPID software. CONTRAST:  100 cc of Omnipaque 350. COMPARISON:  Prior head CT from earlier the same day. FINDINGS: CTA NECK FINDINGS Aortic arch: Visualized aortic arch of normal caliber with normal branch  pattern. No hemodynamically significant stenosis about the origin of the great vessels. Visualized subclavian arteries widely patent. Right carotid system: Right common and internal carotid arteries widely patent without stenosis, dissection or occlusion. Left carotid system: Left CCA patent from its origin to the bifurcation without stenosis. Mild eccentric soft plaque at the origin of the left ICA without significant stenosis. Left ICA patent distally without stenosis, dissection or occlusion. Vertebral arteries: Both vertebral arteries arise from the subclavian arteries. Dominant left vertebral artery with a diffusely hypoplastic right vertebral artery. Left vertebral artery widely patent within the neck without stenosis or other abnormality. Severe stenosis at the origin of the right vertebral artery. Hypoplastic right vertebral artery otherwise patent within the neck without stenosis or other abnormality. Skeleton: No visible acute osseous abnormality, although cervical spine better evaluated on concomitant CT of the cervical spine. No discrete or worrisome osseous lesions. Other neck: No other acute soft tissue abnormality within the neck. No mass or adenopathy. Upper chest: Scattered atelectatic changes noted within the visualized lungs. Few scattered superimposed tree-in-bud nodular densities within the peripheral right upper lobe could reflect changes of acute bronchiolitis and/or mucoid impaction. No frank airspace consolidation. Review of the MIP images confirms the above findings CTA HEAD FINDINGS Anterior circulation: Petrous, cavernous, and supraclinoid segments of both internal carotid arteries are widely patent without stenosis. A1 segments patent bilaterally. Normal anterior communicating artery. 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 Finding presumably chronic in nature  given the chronic bilateral ACA territory infarcts seen on prior noncontrast head CT. Scant distal reconstitution,  likely collateral. M1 segments patent bilaterally. Normal MCA bifurcations. Distal MCA branches well perfused and symmetric. Posterior circulation: Dominant left vertebral artery widely patent to the vertebrobasilar junction. Left PICA patent. Hypoplastic right vertebral artery grossly patent to the vertebrobasilar junction as well. Right PICA not visualized. Basilar patent to its distal aspect without stenosis. Superior cerebellar arteries patent bilaterally. Fetal type origin of the left PCA. Right PCA supplied via the basilar as well as a robust right posterior communicating artery. Scattered atheromatous irregularity within both PCAs without high-grade stenosis. Short-segment mild right P2 stenosis noted (series 10, image 19). Venous sinuses: Not well assessed due to timing of the contrast bolus. Anatomic variants: Fetal type origin of the left PCA. Hypoplastic right vertebral artery. 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 Review of the MIP images confirms the above findings CT Brain Perfusion Findings: ASPECTS: 10 CBF (<30%) Volume: 54mL Perfusion (Tmax>6.0s) volume: 75mL Mismatch Volume: 51mL Infarction Location:Negative CT perfusion for acute ischemia. Delayed perfusion seen within the right ACA distribution, in keeping with the chronic ACA occlusion. IMPRESSION: CTA HEAD AND NECK IMPRESSION: 1. Negative CTA for emergent large vessel occlusion. 2. 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 3. Scattered mild for age atheromatous change elsewhere about the major arterial vasculature of the head and neck. No other hemodynamically significant or correctable stenosis. 4. Few scattered tree-in-bud nodular densities within right upper lobe, nonspecific, but could reflect changes of acute bronchiolitis and/or mucoid impaction. No frank airspace consolidation. CT PERFUSION IMPRESSION: 1. Negative CT perfusion for acute ischemia. 2. Delayed perfusion within the  right ACA distribution, in keeping with the chronic azygos ACA occlusion and chronic ACA territory infarcts. Critical Value/emergent results were called by telephone at the time of interpretation on 11/09/2020 at 9:45 pm to provider [facility] , who verbally acknowledged these results. Electronically Signed   By: Rise Mu M.D.   On: 11/09/2020 22:11   DG Pelvis 1-2 Views  Result Date: 11/09/2020 CLINICAL DATA:  Fall EXAM: PELVIS - 1-2 VIEW COMPARISON:  CT 03/22/2014 FINDINGS: Bones of the pelvis appear intact and congruent. Proximal femora intact and normally located within the acetabula. Moderate degenerative changes noted throughout the lower lumbar spine, bilateral SI joints, and bilateral hips. Some mild left hip soft tissue swelling is present. Finding on a background of diffuse mild body wall edema. Some hyperdense likely excreted contrast media seen within the urinary bladder. Slightly lobular, crenulated bladder contours and possible diverticular outpouchings, can be seen in the setting chronic outlet obstruction particularly in this patient with a history of prostatomegaly seen on comparison pelvic CT. No other acute or suspicious soft tissue abnormalities. IMPRESSION: 1. Mild left hip soft tissue swelling without acute fracture or traumatic malalignment. 2. Moderate degenerative changes throughout the spine, bilateral SI joints and hips. 3. Bladder opacified by excreted contrast media. Slightly lobular, crenulated bladder contours and possible diverticular outpouchings, can be seen in the setting of chronic outlet obstruction particularly in this patient with a history of prostatomegaly. Electronically Signed   By: Kreg Shropshire M.D.   On: 11/09/2020 22:32   DG Knee 1-2 Views Right  Result Date: 11/10/2020 CLINICAL DATA:  Found down, tender to palpation EXAM: RIGHT KNEE - 1-2 VIEW COMPARISON:  None. FINDINGS: Frontal and lateral views of the right knee demonstrate 3 compartmental  osteoarthritis most severe in the medial compartment. No acute fracture, subluxation,  or dislocation. No joint effusion. IMPRESSION: 1. Three compartmental osteoarthritis.  No acute fracture. Electronically Signed   By: Sharlet Salina M.D.   On: 11/10/2020 19:12   CT Code Stroke CTA Neck W/WO contrast  Result Date: 11/09/2020 CLINICAL DATA:  Initial evaluation for acute stroke, left-sided weakness. EXAM: CT ANGIOGRAPHY HEAD AND NECK CT PERFUSION BRAIN TECHNIQUE: Multidetector CT imaging of the head and neck was performed using the standard protocol during bolus administration of intravenous contrast. Multiplanar CT image reconstructions and MIPs were obtained to evaluate the vascular anatomy. Carotid stenosis measurements (when applicable) are obtained utilizing NASCET criteria, using the distal internal carotid diameter as the denominator. Multiphase CT imaging of the brain was performed following IV bolus contrast injection. Subsequent parametric perfusion maps were calculated using RAPID software. CONTRAST:  100 cc of Omnipaque 350. COMPARISON:  Prior head CT from earlier the same day. FINDINGS: CTA NECK FINDINGS Aortic arch: Visualized aortic arch of normal caliber with normal branch pattern. No hemodynamically significant stenosis about the origin of the great vessels. Visualized subclavian arteries widely patent. Right carotid system: Right common and internal carotid arteries widely patent without stenosis, dissection or occlusion. Left carotid system: Left CCA patent from its origin to the bifurcation without stenosis. Mild eccentric soft plaque at the origin of the left ICA without significant stenosis. Left ICA patent distally without stenosis, dissection or occlusion. Vertebral arteries: Both vertebral arteries arise from the subclavian arteries. Dominant left vertebral artery with a diffusely hypoplastic right vertebral artery. Left vertebral artery widely patent within the neck without stenosis or  other abnormality. Severe stenosis at the origin of the right vertebral artery. Hypoplastic right vertebral artery otherwise patent within the neck without stenosis or other abnormality. Skeleton: No visible acute osseous abnormality, although cervical spine better evaluated on concomitant CT of the cervical spine. No discrete or worrisome osseous lesions. Other neck: No other acute soft tissue abnormality within the neck. No mass or adenopathy. Upper chest: Scattered atelectatic changes noted within the visualized lungs. Few scattered superimposed tree-in-bud nodular densities within the peripheral right upper lobe could reflect changes of acute bronchiolitis and/or mucoid impaction. No frank airspace consolidation. Review of the MIP images confirms the above findings CTA HEAD FINDINGS Anterior circulation: Petrous, cavernous, and supraclinoid segments of both internal carotid arteries are widely patent without stenosis. A1 segments patent bilaterally. Normal anterior communicating artery. 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 Finding presumably chronic in nature given the chronic bilateral ACA territory infarcts seen on prior noncontrast head CT. Scant distal reconstitution, likely collateral. M1 segments patent bilaterally. Normal MCA bifurcations. Distal MCA branches well perfused and symmetric. Posterior circulation: Dominant left vertebral artery widely patent to the vertebrobasilar junction. Left PICA patent. Hypoplastic right vertebral artery grossly patent to the vertebrobasilar junction as well. Right PICA not visualized. Basilar patent to its distal aspect without stenosis. Superior cerebellar arteries patent bilaterally. Fetal type origin of the left PCA. Right PCA supplied via the basilar as well as a robust right posterior communicating artery. Scattered atheromatous irregularity within both PCAs without high-grade stenosis. Short-segment mild right P2 stenosis noted (series 10, image  19). Venous sinuses: Not well assessed due to timing of the contrast bolus. Anatomic variants: Fetal type origin of the left PCA. Hypoplastic right vertebral artery. 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 Review of the MIP images confirms the above findings CT Brain Perfusion Findings: ASPECTS: 10 CBF (<30%) Volume: 0mL Perfusion (Tmax>6.0s) volume: 18mL Mismatch Volume: 18mL Infarction Location:Negative CT perfusion for acute ischemia. Delayed  perfusion seen within the right ACA distribution, in keeping with the chronic ACA occlusion. IMPRESSION: CTA HEAD AND NECK IMPRESSION: 1. Negative CTA for emergent large vessel occlusion. 2. 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 3. Scattered mild for age atheromatous change elsewhere about the major arterial vasculature of the head and neck. No other hemodynamically significant or correctable stenosis. 4. Few scattered tree-in-bud nodular densities within right upper lobe, nonspecific, but could reflect changes of acute bronchiolitis and/or mucoid impaction. No frank airspace consolidation. CT PERFUSION IMPRESSION: 1. Negative CT perfusion for acute ischemia. 2. 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 Critical Value/emergent results were called by telephone at the time of interpretation on 11/09/2020 at 9:45 pm to provider [facility] , who verbally acknowledged these results. Electronically Signed   By: Rise Mu M.D.   On: 11/09/2020 22:11   MR BRAIN WO CONTRAST  Result Date: 11/10/2020 CLINICAL DATA:  Stroke follow-up. EXAM: MRI HEAD WITHOUT CONTRAST TECHNIQUE: Multiplanar, multiecho pulse sequences of the brain and surrounding structures were obtained without intravenous contrast. COMPARISON:  Head CT November 09, 2020. FINDINGS: Incomplete study due to patient inability to lie still in the scanner for  the duration the study. Chronic bilateral ACA territory infarct. T2 hyperintensity within the periventricular white matter, nonspecific, most likely related to chronic small vessel ischemia. Brain: No acute infarction, hemorrhage, hydrocephalus, extra-axial collection or mass lesion. Vascular: Normal flow voids at the skull base. Sinuses/Orbits: Opacification of the bilateral frontal sinuses. The orbits are grossly unremarkable. IMPRESSION: 1. Incomplete study . 2. No acute intracranial abnormality identified. 3. Bilateral frontal sinus disease. Electronically Signed   By: Baldemar Lenis M.D.   On: 11/10/2020 18:00   US RENAL  Result Date: 11/10/2020 CLINICAL DATA:  Rule out hydronephrosis EXAM: RENAL / URINARY TRACT ULTRASOUND COMPLETE COMPARISON:  None. FINDINGS: Right Kidney: Renal measurements: 8.7 x 5.1 x 4.7 cm = volume: 109 mL. Echogenicity within normal limits. No mass or hydronephrosis visualized. Left Kidney: Renal measurements: 10.0 x 4.7 x 4.6 cm = volume: 112 mL. Echogenicity within normal limits. No mass or hydronephrosis visualized. Bladder: Decompressed with Foley catheter in place. Other: None. IMPRESSION: No acute findings.  No hydronephrosis. Electronically Signed   By: Charlett Nose M.D.   On: 11/10/2020 21:40   CT C-SPINE NO CHARGE  Result Date: 11/09/2020 CLINICAL DATA:  Left-sided weakness EXAM: CT CERVICAL SPINE WITHOUT CONTRAST TECHNIQUE: Multidetector CT imaging of the cervical spine was performed without intravenous contrast. Multiplanar CT image reconstructions were also generated. COMPARISON:  None. FINDINGS: Alignment: Alignment is anatomic. Skull base and vertebrae: No acute fracture. No primary bone lesion or focal pathologic process. Soft tissues and spinal canal: Please refer to separately reported CT angiography of the neck for description of vascular findings. No prevertebral fluid or swelling. No visible canal hematoma. Disc levels: Bridging anterior  osteophytes are seen throughout the entire cervical spine. Mild disc osteophyte complex at C4-5. No significant central canal or neural foraminal encroachment. Upper chest: Airway is patent. Lung apices are clear. Other: Reconstructed images confirm the above findings. IMPRESSION: 1. No acute cervical spine fracture. 2. Diffuse cervical spondylosis. No significant neural foraminal or central canal stenosis. Electronically Signed   By: Sharlet Salina M.D.   On: 11/09/2020 22:02   CT Code Stroke Cerebral Perfusion with contrast  Result Date: 11/09/2020 CLINICAL DATA:  Initial evaluation  for acute stroke, left-sided weakness. EXAM: CT ANGIOGRAPHY HEAD AND NECK CT PERFUSION BRAIN TECHNIQUE: Multidetector CT imaging of the head and neck was performed using the standard protocol during bolus administration of intravenous contrast. Multiplanar CT image reconstructions and MIPs were obtained to evaluate the vascular anatomy. Carotid stenosis measurements (when applicable) are obtained utilizing NASCET criteria, using the distal internal carotid diameter as the denominator. Multiphase CT imaging of the brain was performed following IV bolus contrast injection. Subsequent parametric perfusion maps were calculated using RAPID software. CONTRAST:  100 cc of Omnipaque 350. COMPARISON:  Prior head CT from earlier the same day. FINDINGS: CTA NECK FINDINGS Aortic arch: Visualized aortic arch of normal caliber with normal branch pattern. No hemodynamically significant stenosis about the origin of the great vessels. Visualized subclavian arteries widely patent. Right carotid system: Right common and internal carotid arteries widely patent without stenosis, dissection or occlusion. Left carotid system: Left CCA patent from its origin to the bifurcation without stenosis. Mild eccentric soft plaque at the origin of the left ICA without significant stenosis. Left ICA patent distally without stenosis, dissection or occlusion. Vertebral  arteries: Both vertebral arteries arise from the subclavian arteries. Dominant left vertebral artery with a diffusely hypoplastic right vertebral artery. Left vertebral artery widely patent within the neck without stenosis or other abnormality. Severe stenosis at the origin of the right vertebral artery. Hypoplastic right vertebral artery otherwise patent within the neck without stenosis or other abnormality. Skeleton: No visible acute osseous abnormality, although cervical spine better evaluated on concomitant CT of the cervical spine. No discrete or worrisome osseous lesions. Other neck: No other acute soft tissue abnormality within the neck. No mass or adenopathy. Upper chest: Scattered atelectatic changes noted within the visualized lungs. Few scattered superimposed tree-in-bud nodular densities within the peripheral right upper lobe could reflect changes of acute bronchiolitis and/or mucoid impaction. No frank airspace consolidation. Review of the MIP images confirms the above findings CTA HEAD FINDINGS Anterior circulation: Petrous, cavernous, and supraclinoid segments of both internal carotid arteries are widely patent without stenosis. A1 segments patent bilaterally. Normal anterior communicating artery. 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 Finding presumably chronic in nature given the chronic bilateral ACA territory infarcts seen on prior noncontrast head CT. Scant distal reconstitution, likely collateral. M1 segments patent bilaterally. Normal MCA bifurcations. Distal MCA branches well perfused and symmetric. Posterior circulation: Dominant left vertebral artery widely patent to the vertebrobasilar junction. Left PICA patent. Hypoplastic right vertebral artery grossly patent to the vertebrobasilar junction as well. Right PICA not visualized. Basilar patent to its distal aspect without stenosis. Superior cerebellar arteries patent bilaterally. Fetal type origin of the left PCA. Right PCA  supplied via the basilar as well as a robust right posterior communicating artery. Scattered atheromatous irregularity within both PCAs without high-grade stenosis. Short-segment mild right P2 stenosis noted (series 10, image 19). Venous sinuses: Not well assessed due to timing of the contrast bolus. Anatomic variants: Fetal type origin of the left PCA. Hypoplastic right vertebral artery. 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 Review of the MIP images confirms the above findings CT Brain Perfusion Findings: ASPECTS: 10 CBF (<30%) Volume: 0mL Perfusion (Tmax>6.0s) volume: 18mL Mismatch Volume: 18mL Infarction Location:Negative CT perfusion for acute ischemia. Delayed perfusion seen within the right ACA distribution, in keeping with the chronic ACA occlusion. IMPRESSION: CTA HEAD AND NECK IMPRESSION: 1. Negative CTA for emergent large vessel occlusion. 2. 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 3. Scattered  mild for age atheromatous change elsewhere about the major arterial vasculature of the head and neck. No other hemodynamically significant or correctable stenosis. 4. Few scattered tree-in-bud nodular densities within right upper lobe, nonspecific, but could reflect changes of acute bronchiolitis and/or mucoid impaction. No frank airspace consolidation. CT PERFUSION IMPRESSION: 1. Negative CT perfusion for acute ischemia. 2. 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 Critical Value/emergent results were called by telephone at the time of interpretation on 11/09/2020 at 9:45 pm to provider [facility] , who verbally acknowledged these results. Electronically Signed   By: Rise Mu M.D.   On: 11/09/2020 22:11   DG CHEST PORT 1 VIEW  Result Date: 11/11/2020 CLINICAL DATA:  PICC line placement EXAM: PORTABLE CHEST 1 VIEW COMPARISON:  11/09/2020  FINDINGS: Left upper extremity PICC line is been placed with its tip within the superior vena cava. Minimal left basilar atelectasis. Lungs are otherwise clear. No pneumothorax or pleural effusion. Cardiac size within normal limits. Pulmonary vascularity is normal. No acute bone abnormality. IMPRESSION: Left upper extremity PICC line tip within the superior vena cava. Electronically Signed   By: Helyn Numbers MD   On: 11/11/2020 13:12   DG Chest Port 1 View  Result Date: 11/09/2020 CLINICAL DATA:  Fall EXAM: PORTABLE CHEST 1 VIEW COMPARISON:  None. FINDINGS: The heart size and mediastinal contours are within normal limits. Both lungs are clear. The visualized skeletal structures are unremarkable. IMPRESSION: No active disease. Electronically Signed   By: Jonna Clark M.D.   On: 11/09/2020 22:38   DG Swallowing Func-Speech Pathology  Result Date: 11/11/2020 Objective Swallowing Evaluation: Type of Study: MBS-Modified Barium Swallow Study  Patient Details Name: Jeffery Christian MRN: [id number] Date of Birth: [birth year] Today's Date: 11/11/2020 Time: SLP Start Time (ACUTE ONLY): 1437 -SLP Stop Time (ACUTE ONLY): 1455 SLP Time Calculation (min) (ACUTE ONLY): 18 min Past Medical History: Past Medical History: Diagnosis Date . Chronic systolic CHF (congestive heart failure) (HCC)   EF 35-40, diffuse HK, mild MR, moderate LAE, mild RAE, PASP 44, L pleural eff . Dilated cardiomyopathy (HCC)   likely related to tachycardia . Persistent atrial fibrillation [facility])  Past Surgical History: Past Surgical History: Procedure Laterality Date . CARDIOVERSION N/A 08/20/2017  Procedure: CARDIOVERSION;  Surgeon: [name], MD;  Location: [facility] ENDOSCOPY;  Service: Cardiovascular;  Laterality: N/A; . INCISION AND DRAINAGE ABSCESS Left 03/22/2014  Procedure: INCISION AND DRAINAGE ABSCESS LEFT BUTTOCK ABSCESS;  Surgeon: [name], MD;  Location: [facility] OR;  Service: General;  Laterality: Left; HPI: Pt is a 70 y.o. male with medical  history significant for atrial fibrillation on Eliquis-last dose unknown, dilated cardiomyopathy, chronic systolic CHF, CKD 3 who presented after being found on the floor at home by his friends who had last seen him one week prior. EMT noticed increased weakness on the left and he was brought in as a code stroke; this was ultimately cancelled by neurology. MRI was negative for acute changes. Pt found to have COVID-19. CXR 2/8 was negative.  No data recorded Assessment / Plan / Recommendation CHL IP CLINICAL IMPRESSIONS 11/11/2020 Clinical Impression Cervical osteophytes were noted, but did not significantly impact swallow function. Pt demonstrated decreased bolus cohesion, a pharyngeal delay, and incomplete epiglottic inverversion with the initial bolus of thin liquids via cup which was administered by the SLP. Premature spillage was noted to the pyriform sinuses and aspiration (PAS 7) noted thereafter secondary to  the pharyngeal delay and incomplete epiglottic inversion. Coughing was noted and effective in expelling some of the aspirated material. Subsequent swallows were significant for prolonged mastication, and mildly reduced lingual retraction, but his swallow was notably more timely with self-feeding and no subsequent instances of penetration or aspiration were noted even when challenged with large sips and consecutive swallows. Mild to moderate vallecular residue was demonstrated with solids and was reduced to a functional level with a liquid wash. A dysphagia 3 diet with thin liquids is recommended at this time. Pt's swallow function and associated aspiration risk are much improved with self-feeding and it is recommended that this be encouraged with supervision. SLP will follow to ensure diet tolerance. SLP Visit Diagnosis Dysphagia, unspecified (R13.10) Attention and concentration deficit following -- Frontal lobe and executive function deficit following -- Impact on safety and function Moderate aspiration  risk;Mild aspiration risk   CHL IP TREATMENT RECOMMENDATION 11/11/2020 Treatment Recommendations Therapy as outlined in treatment plan below   Prognosis 11/11/2020 Prognosis for Safe Diet Advancement Good Barriers to Reach Goals Cognitive deficits Barriers/Prognosis Comment -- CHL IP DIET RECOMMENDATION 11/11/2020 SLP Diet Recommendations Dysphagia 3 (Mech soft) solids;Thin liquid Liquid Administration via Cup;Straw Medication Administration Whole meds with liquid Compensations Slow rate;Small sips/bites Postural Changes Remain semi-upright after after feeds/meals (Comment);Seated upright at 90 degrees   CHL IP OTHER RECOMMENDATIONS 11/11/2020 Recommended Consults -- Oral Care Recommendations Oral care BID Other Recommendations --   CHL IP FOLLOW UP RECOMMENDATIONS 11/11/2020 Follow up Recommendations (No Data)   CHL IP FREQUENCY AND DURATION 11/11/2020 Speech Therapy Frequency (ACUTE ONLY) min 2x/week Treatment Duration 2 weeks      CHL IP ORAL PHASE 11/11/2020 Oral Phase Impaired Oral - Pudding Teaspoon -- Oral - Pudding Cup -- Oral - Honey Teaspoon -- Oral - Honey Cup -- Oral - Nectar Teaspoon -- Oral - Nectar Cup WFL Oral - Nectar Straw WFL Oral - Thin Teaspoon -- Oral - Thin Cup Decreased bolus cohesion;Premature spillage Oral - Thin Straw WFL Oral - Puree WFL Oral - Mech Soft WFL Oral - Regular WFL Oral - Multi-Consistency -- Oral - Pill WFL Oral Phase - Comment --  CHL IP PHARYNGEAL PHASE 11/11/2020 Pharyngeal Phase Impaired Pharyngeal- Pudding Teaspoon -- Pharyngeal -- Pharyngeal- Pudding Cup -- Pharyngeal -- Pharyngeal- Honey Teaspoon -- Pharyngeal -- Pharyngeal- Honey Cup -- Pharyngeal -- Pharyngeal- Nectar Teaspoon -- Pharyngeal -- Pharyngeal- Nectar Cup Reduced tongue base retraction Pharyngeal -- Pharyngeal- Nectar Straw WFL Pharyngeal -- Pharyngeal- Thin Teaspoon -- Pharyngeal -- Pharyngeal- Thin Cup Reduced tongue base retraction;Delayed swallow initiation-pyriform sinuses;Pharyngeal residue -  valleculae;Pharyngeal residue - pyriform;Penetration/Aspiration during swallow;Penetration/Apiration after swallow;Moderate aspiration Pharyngeal Material enters airway, passes BELOW cords and not ejected out despite cough attempt by patient Pharyngeal- Thin Straw -- Pharyngeal -- Pharyngeal- Puree -- Pharyngeal -- Pharyngeal- Mechanical Soft -- Pharyngeal -- Pharyngeal- Regular -- Pharyngeal -- Pharyngeal- Multi-consistency -- Pharyngeal -- Pharyngeal- Pill -- Pharyngeal -- Pharyngeal Comment --  No flowsheet data found. Jeffery Christian 11/11/2020, 3:52 PM              DG HIP UNILAT WITH PELVIS 2-3 VIEWS RIGHT  Result Date: 11/10/2020 CLINICAL DATA:  Found down, right hip tenderness EXAM: DG HIP (WITH OR WITHOUT PELVIS) 2-3V RIGHT COMPARISON:  None. FINDINGS: Frontal view of the pelvis including both hips as well as frontal and frogleg lateral views of the right hip are obtained. No fracture, subluxation, or dislocation. Joint spaces are well preserved. Soft tissues are normal. IMPRESSION: 1. No acute displaced  fracture. Electronically Signed   By: Sharlet SalinaMichael  Brown M.D.   On: 11/10/2020 19:09   CT HEAD CODE STROKE WO CONTRAST  Result Date: 11/09/2020 CLINICAL DATA:  Code stroke. Initial evaluation for acute left-sided weakness, stroke suspected. EXAM: CT HEAD WITHOUT CONTRAST TECHNIQUE: Contiguous axial images were obtained from the base of the skull through the vertex without intravenous contrast. COMPARISON:  None available. FINDINGS: Brain: Age-related cerebral atrophy with chronic small vessel ischemic disease. Remote lacunar infarct present at the left caudate. Remote bilateral ACA territory infarcts noted. No acute intracranial hemorrhage. No acute large vessel territory infarct. No mass lesion, mass effect or midline shift. Mild ventricular prominence related to global parenchymal volume loss without hydrocephalus. No extra-axial fluid collection. Vascular: No hyperdense vessel. Skull: Scalp soft  tissues demonstrate no acute finding. Small lipoma noted at the posterior scalp. Calvarium intact. Sinuses/Orbits: Globes and orbital soft tissues within normal limits. Chronic frontal sinusitis noted. Scattered mucosal thickening noted elsewhere within the sphenoid ethmoidal sinuses. No mastoid effusion. Other: None. ASPECTS [facility]([place] Stroke Program Early CT Score) - Ganglionic level infarction (caudate, lentiform nuclei, internal capsule, insula, M1-M3 cortex): 7 - Supraganglionic infarction (M4-M6 cortex): 3 Total score (0-10 with 10 being normal): 10 IMPRESSION: 1. No acute intracranial infarct or other abnormality. 2. ASPECTS is 10. 3. Remote bilateral ACA territory infarcts, with additional remote lacunar infarct at the left basal ganglia. 4. Underlying atrophy with chronic small vessel ischemic disease. These results were communicated to Dr. Wilford CornerArora at 9:31 pmon 2/8/2022by text page via the [facility] messaging system. Electronically Signed   By: Rise MuBenjamin  McClintock M.D.   On: 11/09/2020 21:51   ECHOCARDIOGRAM LIMITED  Result Date: 11/10/2020    ECHOCARDIOGRAM LIMITED REPORT   Patient Name:   Jeffery Christian  Date of Exam: 11/10/2020 Medical Rec #:  [id number]  Height:       72.0 in Accession #:    [id number] Weight:       217.6 lb Date of Birth:  03/09/1951 BSA:          2.208 m Patient Age:    69 years   BP:           138/94 mmHg Patient Gender: M          HR:           138 bpm. Exam Location:  Inpatient Procedure: Limited Echo, Cardiac Doppler and Color Doppler Indications:    Atrial fibrillation  History:        Patient has prior history of Echocardiogram examinations, most                 recent 07/19/2017. CHF; Arrythmias:Atrial Fibrillation. Dilated                 cardiomyopathy. CKD.  Sonographer:    Ross LudwigArthur Guy RDCS (AE) Referring Phys: [id number] [facility]  Sonographer Comments: Suboptimal parasternal window. IMPRESSIONS  1. Left ventricular ejection fraction, by estimation, is 60 to 65%. The left  ventricle has normal function. The left ventricle has no regional wall motion abnormalities. There is moderate left ventricular hypertrophy. Left ventricular diastolic parameters are indeterminate.  2. Right ventricular systolic function is normal. The right ventricular size is normal.  3. Left atrial size was mildly dilated.  4. The mitral valve is normal in structure. Trivial mitral valve regurgitation. No evidence of mitral stenosis.  5. The aortic valve was not well visualized. Aortic valve regurgitation is not visualized. No aortic stenosis is present.  6. The  inferior vena cava is dilated in size with >50% respiratory variability, suggesting right atrial pressure of 8 mmHg. FINDINGS  Left Ventricle: Left ventricular ejection fraction, by estimation, is 60 to 65%. The left ventricle has normal function. The left ventricle has no regional wall motion abnormalities. The left ventricular internal cavity size was normal in size. There is  moderate left ventricular hypertrophy. Left ventricular diastolic parameters are indeterminate. Right Ventricle: The right ventricular size is normal. No increase in right ventricular wall thickness. Right ventricular systolic function is normal. Left Atrium: Left atrial size was mildly dilated. Right Atrium: Right atrial size was normal in size. Pericardium: There is no evidence of pericardial effusion. Mitral Valve: The mitral valve is normal in structure. Trivial mitral valve regurgitation. No evidence of mitral valve stenosis. Tricuspid Valve: The tricuspid valve is normal in structure. Tricuspid valve regurgitation is mild . No evidence of tricuspid stenosis. Aortic Valve: The aortic valve was not well visualized. Aortic valve regurgitation is not visualized. No aortic stenosis is present. Pulmonic Valve: The pulmonic valve was normal in structure. Pulmonic valve regurgitation is not visualized. No evidence of pulmonic stenosis. Aorta: The aortic root is normal in size and  structure. Venous: The inferior vena cava is dilated in size with greater than 50% respiratory variability, suggesting right atrial pressure of 8 mmHg. IAS/Shunts: No atrial level shunt detected by color flow Doppler. LEFT VENTRICLE PLAX 2D LVIDd:         4.80 cm LVIDs:         3.40 cm LV PW:         1.60 cm LV IVS:        1.50 cm LVOT diam:     2.10 cm LVOT Area:     3.46 cm  IVC IVC diam: 1.70 cm LEFT ATRIUM         Index LA diam:    3.90 cm 1.77 cm/m   AORTA Ao Root diam: 3.60 cm  SHUNTS Systemic Diam: 2.10 cm Charlton Haws MD Electronically signed by Charlton Haws MD Signature Date/Time: 11/10/2020/3:42:53 PM    Final    [place] EKG SITE RITE  Result Date: 11/11/2020 If Site Rite image not attached, placement could not be confirmed due to current cardiac rhythm.   Micro Results     Recent Results (from the past 240 hour(s))  Resp Panel by RT-PCR (Flu A&B, Covid) Nasopharyngeal Swab     Status: Abnormal   Collection Time: 11/09/20  9:13 PM   Specimen: Nasopharyngeal Swab; Nasopharyngeal(NP) swabs in vial transport medium  Result Value Ref Range Status   SARS Coronavirus 2 by RT PCR POSITIVE (A) NEGATIVE Final    Comment: RESULT CALLED TO, READ BACK BY AND VERIFIED WITH: Minus Breeding RN 11/09/20 2227 JDW (NOTE) SARS-CoV-2 target nucleic acids are DETECTED.  The SARS-CoV-2 RNA is generally detectable in upper respiratory specimens during the acute phase of infection. Positive results are indicative of the presence of the identified virus, but do not rule out bacterial infection or co-infection with other pathogens not detected by the test. Clinical correlation with patient history and other diagnostic information is necessary to determine patient infection status. The expected result is Negative.  Fact Sheet for Patients: BloggerCourse.com  Fact Sheet for Healthcare Providers: SeriousBroker.it  This test is not yet approved or cleared by the  [place] FDA and  has been authorized for detection and/or diagnosis of SARS-CoV-2 by FDA under an Emergency Use Authorization (EUA).  This EUA will remain in  effect (meaning this test can be used)  for the duration of  the COVID-19 declaration under Section 564(b)(1) of the Act, 21 U.S.C. section 360bbb-3(b)(1), unless the authorization is terminated or revoked sooner.     Influenza A by PCR NEGATIVE NEGATIVE Final   Influenza B by PCR NEGATIVE NEGATIVE Final    Comment: (NOTE) The Xpert Xpress SARS-CoV-2/FLU/RSV plus assay is intended as an aid in the diagnosis of influenza from Nasopharyngeal swab specimens and should not be used as a sole basis for treatment. Nasal washings and aspirates are unacceptable for Xpert Xpress SARS-CoV-2/FLU/RSV testing.  Fact Sheet for Patients: BloggerCourse.com  Fact Sheet for Healthcare Providers: SeriousBroker.it  This test is not yet approved or cleared by the [place] FDA and has been authorized for detection and/or diagnosis of SARS-CoV-2 by FDA under an Emergency Use Authorization (EUA). This EUA will remain in effect (meaning this test can be used) for the duration of the COVID-19 declaration under Section 564(b)(1) of the Act, 21 U.S.C. section 360bbb-3(b)(1), unless the authorization is terminated or revoked.  Performed at [facility], [address] [place], [place] [address]   Culture, Urine     Status: Abnormal   Collection Time: 11/11/20  7:34 AM   Specimen: Urine, Random  Result Value Ref Range Status   Specimen Description URINE, RANDOM  Final   Special Requests   Final    NONE Performed at [facility], [address], [place], [place] [address]    Culture MULTIPLE SPECIES PRESENT, SUGGEST RECOLLECTION (A)  Final   Report Status 11/12/2020 FINAL  Final       Today   Subjective:   Jeffery Christian today self denies any complaints, no significant  events overnight as discussed with staff, his Foley catheter is back for urinary retention, he has hydrotherapy earlier today, with small amount of oozing which has stabilized after silver dinitrate sticks, so far no recurrence of any blood, and bandage remains dry.  .   Objective:   Blood pressure 121/66, pulse 88, temperature 99.2 F (37.3 C), temperature source Oral, resp. rate 13, height 6' (1.829 m), weight 98.7 kg, SpO2 99 %.   Intake/Output Summary (Last 24 hours) at 11/16/2020 1357 Last data filed at 11/16/2020 1045 Gross per 24 hour  Intake 1100 ml  Output 1625 ml  Net -525 ml    Exam  Awake, alert, communicative, more appropriate today, follow commands and answering most of questions appropriately, but remains with some confusion, easily distracted . Symmetrical Chest wall movement, Good air movement bilaterally, CTAB Irregular irregular, heart rate controlled. Bowel sounds present, soft, nontender No Cyanosis, or edema, right lower extremity weakness, right knee bruising, right gluteal unstageable pressure ulcer, wound at glans penis has significantly  improved.             Data Review   CBC w Diff:  Lab Results  Component Value Date   WBC 10.1 11/16/2020   HGB 10.5 (L) 11/16/2020   HCT 31.5 (L) 11/16/2020   PLT 225 11/16/2020   LYMPHOPCT 12 11/14/2020   MONOPCT 5 11/14/2020   EOSPCT 2 11/14/2020   BASOPCT 0 11/14/2020    CMP:  Lab Results  Component Value Date   NA 141 11/16/2020   NA 146 (H) 06/08/2020   K 3.8 11/16/2020   CL 114 (H) 11/16/2020   CO2 21 (L) 11/16/2020   BUN 24 (H) 11/16/2020   BUN 13 06/08/2020   CREATININE 1.22 11/16/2020  PROT 4.8 (L) 11/14/2020   PROT 6.6 06/08/2020   ALBUMIN 2.0 (L) 11/14/2020   ALBUMIN 3.9 06/08/2020   BILITOT 0.7 11/14/2020   BILITOT 0.4 06/08/2020   ALKPHOS 71 11/14/2020   AST 38 11/14/2020   ALT 47 (H) 11/14/2020  .   Total Time in preparing paper work, data evaluation and todays exam - 35  minutes  Huey Bienenstock M.D on 11/16/2020 at 1:57 PM  [facility]   Office  [phone]

## 2020-11-16 NOTE — Progress Notes (Signed)
Physical Therapy Wound Treatment Patient Details  Name: Jeffery Christian MRN: [id number] Date of Birth: Dec 05, 1950  Today's Date: 11/16/2020 Time: 0904-1000 Time Calculation (min): 56 min  Subjective  Subjective: Pt more talkative today Patient and Family Stated Goals: did not state Prior Treatments: dressing change  Pain Score:  2/10  Wound Assessment  Pressure Injury Buttocks Right Unstageable - Full thickness tissue loss in which the base of the injury is covered by slough (yellow, tan, gray, green or brown) and/or eschar (tan, brown or black) in the wound bed. (Active)  Wound Image   11/16/20 1141  Dressing Type ABD;Barrier Film (skin prep);Gauze (Comment);Moist to moist;Santyl 11/16/20 1141  Dressing Changed;Clean;Dry;Intact 11/16/20 1141  Dressing Change Frequency Twice a day 11/16/20 1141  State of Healing Early/partial granulation 11/16/20 1141  Site / Wound Assessment Bleeding;Red;Yellow;Black 11/16/20 1141  % Wound base Red or Granulating 50% 11/16/20 1141  % Wound base Yellow/Fibrinous Exudate 40% 11/16/20 1141  % Wound base Black/Eschar 10% 11/16/20 1141  % Wound base Other/Granulation Tissue (Comment) 0% 11/16/20 1141  Peri-wound Assessment Pink;Maceration 11/16/20 1141  Wound Length (cm) 11 cm 11/13/20 1011  Wound Width (cm) 9 cm 11/13/20 1011  Wound Depth (cm) 3 cm 11/13/20 1011  Wound Surface Area (cm^2) 99 cm^2 11/13/20 1011  Wound Volume (cm^3) 297 cm^3 11/13/20 1011  Tunneling (cm) 0 11/12/20 0746  Undermining (cm) 0 11/12/20 0746  Margins Unattached edges (unapproximated) 11/16/20 1141  Drainage Amount Copious 11/16/20 1141  Drainage Description Sanguineous 11/16/20 1141  Treatment Hydrotherapy (Pulse lavage);Packing (Saline gauze) 11/16/20 1141      Hydrotherapy Pulsed lavage therapy - wound location: R buttocks Pulsed Lavage with Suction (psi): 4 psi Pulsed Lavage with Suction - Normal Saline Used: 1000 mL Pulsed Lavage Tip: Tip with splash shield   Wound  Assessment and Plan  Wound Therapy - Assess/Plan/Recommendations Wound Therapy - Clinical Statement: Pt received with saturated dressing (per chart review, declined night shift dressing change). Performed hydrotherapy, but held on sharp debridement due to copious bleeding. Discussed with RN/MD and ordered silver nitrate sticks. RN informed PT she applied following session with good success. Pt will benefit from hydrotherapy to perform selective debridement and decrease bioburden. Wound Therapy - Functional Problem List: immobility Factors Delaying/Impairing Wound Healing: Immobility Hydrotherapy Plan: Debridement;Patient/family education;Pulsatile lavage with suction Wound Therapy - Frequency: 6X / week Wound Therapy - Follow Up Recommendations: Skilled nursing facility Wound Plan: see above  Wound Therapy Goals- Improve the function of patient's integumentary system by progressing the wound(s) through the phases of wound healing (inflammation - proliferation - remodeling) by: Decrease Necrotic Tissue to: 20 Decrease Necrotic Tissue - Progress: Goal set today Increase Granulation Tissue to: 80 Increase Granulation Tissue - Progress: Goal set today Goals/treatment plan/discharge plan were made with and agreed upon by patient/family: Yes Time For Goal Achievement: 7 days Wound Therapy - Potential for Goals: Fair  Goals will be updated until maximal potential achieved or discharge criteria met.  Discharge criteria: when goals achieved, discharge from hospital, MD decision/surgical intervention, no progress towards goals, refusal/missing three consecutive treatments without notification or medical reason.  GP    Wyona Almas, PT, DPT Acute Rehabilitation Services Pager [phone] Office [phone]    Deno Etienne 11/16/2020, 11:49 AM

## 2020-11-16 NOTE — Progress Notes (Signed)
Ok to [employer] for now d/t to some bleeding per Dr Randol Kern. Plan to resume apixaban at dc to SNF today.  Ulyses Southward, PharmD, BCIDP, AAHIVP, CPP Infectious Disease Pharmacist 11/16/2020 11:31 AM

## 2020-11-17 ENCOUNTER — Encounter: Payer: Self-pay | Admitting: Adult Health

## 2020-11-17 ENCOUNTER — Non-Acute Institutional Stay (SKILLED_NURSING_FACILITY): Payer: Medicare Other | Admitting: Adult Health

## 2020-11-17 DIAGNOSIS — U071 COVID-19: Secondary | ICD-10-CM

## 2020-11-17 DIAGNOSIS — R339 Retention of urine, unspecified: Secondary | ICD-10-CM

## 2020-11-17 DIAGNOSIS — R29898 Other symptoms and signs involving the musculoskeletal system: Secondary | ICD-10-CM | POA: Diagnosis not present

## 2020-11-17 DIAGNOSIS — N179 Acute kidney failure, unspecified: Secondary | ICD-10-CM | POA: Diagnosis not present

## 2020-11-17 DIAGNOSIS — I4891 Unspecified atrial fibrillation: Secondary | ICD-10-CM | POA: Diagnosis not present

## 2020-11-17 DIAGNOSIS — N1831 Chronic kidney disease, stage 3a: Secondary | ICD-10-CM | POA: Diagnosis not present

## 2020-11-17 NOTE — Progress Notes (Signed)
Location:  [facility] Room Number: 310/A Place of Service:  SNF (31) Provider:  Kenard Gower, DNP, FNP-BC  Patient Care Team: Hoy Register, MD as PCP - General (Family Medicine)  Extended Emergency Contact Information Primary Emergency Contact: Peterson Lombard  [place] Home Phone: [phone] Relation: Daughter Secondary Emergency Contact: SAPP,RON Home Phone: [phone] Relation: Other  Code Status: Full Code   Goals of care: Advanced Directive information Advanced Directives 11/18/2020  Does Patient Have a Medical Advance Directive? No  Would patient like information on creating a medical advance directive? No - Patient declined  Pre-existing out of facility DNR order (yellow form or pink MOST form) -     Chief Complaint  Patient presents with  . Hospitalization Follow-up    Hospitalization Follow Up     HPI:  Pt is a 70 y.o. male who was admitted to [facility] on 11/16/20 post hospitalization 11/09/20 to 11/16/20.  He has a PMH of atrial fibrillation on Eliquis, dilated cardiomyopathy, chronic systolic CHF and chronic kidney disease is stage III.  He was brought to the hospital via EMS after being found on the floor at home by his friends.  He was last seen about a week prior to hospitalization.  His friends had not heard from him in the last week so they went to check him in his house and upon getting no response at this door, not the door down and found him down on the ground between a dresser and the bed.  He was not talking at that time and EMT noted him to be weaker on the left in comparison to the right.  EMTs reported urine and stool around him when they picked him up from his house.  In the ED, he was found to have elevated heart rate in the 160-180 range.  EKG showed atrial fibrillation with RVR.  He was started on amiodarone drip, but with poor control, transitioned to Cardizem drip which improved  heart rates.  Cardizem drip was discontinued and transitioned to metoprolol tartrate. CT of the head was negative for acute stroke.  Neurology was consulted and evaluated patient.  He was found to be hyponatremic with sodium of 168 and troponin was elevated at 56.  It was thought to be due to volume depletion/dehydration.  He was found to have COVID-19 infection.  He is vaccinated but not boostered.  Chest x-ray was negative for pneumonia.  He was treated with remdesivir x3 days.  MRI was negative for acute CVA or acute findings.  EEG within normal limits and no seizures.  He was admitted with right gluteal unstageable pressure ulcer and was thought to be related to being on the floor for an unknown period of time.  He had urinary retention which required Foley catheter.  He failed voiding trial on 2/12 and Foley catheter was reinserted 2/13.  He was started on Flomax.  He was seen in his room today.  He does not verbally respond to queries but follows simple commands.  Past Medical History:  Diagnosis Date  . Chronic systolic CHF (congestive heart failure) (HCC)    EF 35-40, diffuse HK, mild MR, moderate LAE, mild RAE, PASP 44, L pleural eff  . Dilated cardiomyopathy (HCC)    likely related to tachycardia  . Persistent atrial fibrillation [facility])    Past Surgical History:  Procedure Laterality Date  . CARDIOVERSION N/A 08/20/2017   Procedure: CARDIOVERSION;  Surgeon: [name], MD;  Location: [facility] ENDOSCOPY;  Service: Cardiovascular;  Laterality: N/A;  . INCISION AND DRAINAGE ABSCESS Left 03/22/2014   Procedure: INCISION AND DRAINAGE ABSCESS LEFT BUTTOCK ABSCESS;  Surgeon: [name], MD;  Location: [facility] OR;  Service: General;  Laterality: Left;    No Known Allergies  Outpatient Encounter Medications as of 11/17/2020  Medication Sig  . acetaminophen (TYLENOL) 650 MG suppository Place 1 suppository (650 mg total) rectally every 6 (six) hours as needed for fever.  Marland Kitchen allopurinol (ZYLOPRIM)  300 MG tablet Take 1 tablet (300 mg total) by mouth 2 (two) times daily.  Marland Kitchen apixaban (ELIQUIS) 5 MG TABS tablet Take 1 tablet (5 mg total) by mouth 2 (two) times daily.  Marland Kitchen atorvastatin (LIPITOR) 20 MG tablet Take 1 tablet (20 mg total) by mouth daily.  . colchicine 0.6 MG tablet Take 0.6 mg by mouth. Take 1 capsule 1 hour after taking 2 capsule if GOUT symptoms have not subsided  . colchicine 0.6 MG tablet Take 1.2 mg by mouth daily. At the onset of a GOUT Flare as needed  . collagenase (SANTYL) ointment Apply topically daily. Apply to gluteal pressure ulcers daily.  . feeding supplement (ENSURE ENLIVE / ENSURE PLUS) LIQD Take 237 mLs by mouth 2 (two) times daily between meals.  . metoprolol tartrate (LOPRESSOR) 25 MG tablet Take 1 tablet (25 mg total) by mouth 2 (two) times daily.  . midodrine (PROAMATINE) 2.5 MG tablet Take 1 tablet (2.5 mg total) by mouth 3 (three) times daily with meals.  Marland Kitchen neomycin-bacitracin-polymyxin (NEOSPORIN) OINT Apply 1 application topically 2 (two) times daily. Please apply to glans penis wound BID.  Marland Kitchen pantoprazole (PROTONIX) 40 MG tablet Take 1 tablet (40 mg total) by mouth daily.  . tamsulosin (FLOMAX) 0.4 MG CAPS capsule Take 1 capsule (0.4 mg total) by mouth daily after supper.  . [DISCONTINUED] colchicine 0.6 MG tablet Take 2 tabs (1.2 mg) at the onset of a gout flare and repeat 1 tab (0.6 mg) in 1 hour if symptoms persist   No facility-administered encounter medications on file as of 11/17/2020.    Review of Systems  Unable to obtain due to neurocognitive impairment, sleepy.   Immunization History  Administered Date(s) Administered  . Pneumococcal Conjugate-13 10/28/2018  . Pneumococcal Polysaccharide-23 09/08/2020   Pertinent  Health Maintenance Due  Topic Date Due  . COLONOSCOPY (Pts 45-32yrs Insurance coverage will need to be confirmed)  Never done  . INFLUENZA VACCINE  12/30/2020 (Originally 05/02/2020)  . PNA vac Low Risk Adult  Completed   Fall  Risk  09/08/2020 06/08/2020 10/28/2018 07/10/2018 04/09/2018  Falls in the past year? 0 0 0 No No  Number falls in past yr: 0 - - - -  Injury with Fall? 0 - - - -  Risk for fall due to : - No Fall Risks - - -     Vitals:   11/17/20 1016  BP: 96/64  Pulse: 100  Resp: 20  Temp: 97.7 F (36.5 C)  SpO2: 97%  Weight: 193 lb 9.6 oz (87.8 kg)  Height: 6' (1.829 m)   Body mass index is 26.26 kg/m.  Physical Exam  GENERAL APPEARANCE: Well nourished. In no acute distress. Normal body habitus SKIN:  Right gluteal unstageable pressure ulcer MOUTH and THROAT: Lips are without lesions. Oral mucosa is moist and without lesions.  RESPIRATORY: Breathing is even & unlabored, BS CTAB CARDIAC:  no murmur,no extra heart sounds GI: Abdomen soft, normal BS, no masses, no tenderness NEUROLOGICAL: Has  left-sided weakness, sleepy   Labs reviewed: Recent Labs    11/10/20 1508 11/10/20 2000 11/14/20 0310 11/15/20 0306 11/16/20 0310  NA 167*   < > 148* 142 141  K 4.1   < > 3.9 3.7 3.8  CL >130*   < > 119* 114* 114*  CO2 20*   < > 21* 21* 21*  GLUCOSE 152*   < > 122* 202* 118*  BUN 85*   < > 37* 28* 24*  CREATININE 2.58*   < > 1.41* 1.46* 1.22  CALCIUM 8.5*   < > 8.3* 8.0* 8.0*  MG  --   --  2.2  --   --   PHOS 3.5  --   --   --   --    < > = values in this interval not displayed.   Recent Labs    11/12/20 0345 11/13/20 0304 11/14/20 0310  AST 27 32 38  ALT 26 41 47*  ALKPHOS 56 64 71  BILITOT 0.6 0.7 0.7  PROT 4.6* 4.8* 4.8*  ALBUMIN 2.2* 2.1* 2.0*   Recent Labs    11/12/20 0345 11/13/20 0304 11/14/20 0310 11/16/20 0310  WBC 8.6 6.8 9.4 10.1  NEUTROABS 7.2 5.5 7.5  --   HGB 11.7* 11.9* 12.3* 10.5*  HCT 36.7* 35.9* 37.0* 31.5*  MCV 89.5 86.5 85.1 85.1  PLT 208 191 214 225   Lab Results  Component Value Date   TSH 2.627 11/14/2020   Lab Results  Component Value Date   HGBA1C 5.3 06/08/2020   Lab Results  Component Value Date   CHOL 139 10/28/2018   HDL 39 (L)  10/28/2018   LDLCALC 65 10/28/2018   TRIG 174 (H) 10/28/2018   CHOLHDL 3.6 10/28/2018    Significant Diagnostic Results in last 30 days:  EEG  Result Date: 11/12/2020 Charlsie Quest, MD     11/12/2020 10:37 AM Patient Name: Demontra Barthell MRN: [id number] Epilepsy Attending: Charlsie Quest Referring Physician/Provider: Dr Caryl Pina Date: 11/12/2020 Duration: 23.19 mins Patient history: 70 yo M with ams. EEG to evaluate for seizure Level of alertness: Awake, drowsy, sleep, comatose, lethargic AEDs during EEG study: None Technical aspects: This EEG study was done with scalp electrodes positioned according to the 10-20 International system of electrode placement. Electrical activity was acquired at a sampling rate of 500Hz  and reviewed with a high frequency filter of 70Hz  and a low frequency filter of 1Hz . EEG data were recorded continuously and digitally stored. Description: The posterior dominant rhythm consists of 8-9 Hz activity of moderate voltage (25-35 uV) seen predominantly in posterior head regions, symmetric and reactive to eye opening and eye closing. Hyperventilation and photic stimulation were not performed.   IMPRESSION: This study is within normal limits. No seizures or epileptiform discharges were seen throughout the recording. Charlsie Quest   CT Code Stroke CTA Head W/WO contrast  Result Date: 11/09/2020 CLINICAL DATA:  Initial evaluation for acute stroke, left-sided weakness. EXAM: CT ANGIOGRAPHY HEAD AND NECK CT PERFUSION BRAIN TECHNIQUE: Multidetector CT imaging of the head and neck was performed using the standard protocol during bolus administration of intravenous contrast. Multiplanar CT image reconstructions and MIPs were obtained to evaluate the vascular anatomy. Carotid stenosis measurements (when applicable) are obtained utilizing NASCET criteria, using the distal internal carotid diameter as the denominator. Multiphase CT imaging of the brain was performed following IV bolus  contrast injection. Subsequent parametric perfusion maps were calculated using RAPID software. CONTRAST:  100 cc of  Omnipaque 350. COMPARISON:  Prior head CT from earlier the same day. FINDINGS: CTA NECK FINDINGS Aortic arch: Visualized aortic arch of normal caliber with normal branch pattern. No hemodynamically significant stenosis about the origin of the great vessels. Visualized subclavian arteries widely patent. Right carotid system: Right common and internal carotid arteries widely patent without stenosis, dissection or occlusion. Left carotid system: Left CCA patent from its origin to the bifurcation without stenosis. Mild eccentric soft plaque at the origin of the left ICA without significant stenosis. Left ICA patent distally without stenosis, dissection or occlusion. Vertebral arteries: Both vertebral arteries arise from the subclavian arteries. Dominant left vertebral artery with a diffusely hypoplastic right vertebral artery. Left vertebral artery widely patent within the neck without stenosis or other abnormality. Severe stenosis at the origin of the right vertebral artery. Hypoplastic right vertebral artery otherwise patent within the neck without stenosis or other abnormality. Skeleton: No visible acute osseous abnormality, although cervical spine better evaluated on concomitant CT of the cervical spine. No discrete or worrisome osseous lesions. Other neck: No other acute soft tissue abnormality within the neck. No mass or adenopathy. Upper chest: Scattered atelectatic changes noted within the visualized lungs. Few scattered superimposed tree-in-bud nodular densities within the peripheral right upper lobe could reflect changes of acute bronchiolitis and/or mucoid impaction. No frank airspace consolidation. Review of the MIP images confirms the above findings CTA HEAD FINDINGS Anterior circulation: Petrous, cavernous, and supraclinoid segments of both internal carotid arteries are widely patent without  stenosis. A1 segments patent bilaterally. Normal anterior communicating artery. 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 Finding presumably chronic in nature given the chronic bilateral ACA territory infarcts seen on prior noncontrast head CT. Scant distal reconstitution, likely collateral. M1 segments patent bilaterally. Normal MCA bifurcations. Distal MCA branches well perfused and symmetric. Posterior circulation: Dominant left vertebral artery widely patent to the vertebrobasilar junction. Left PICA patent. Hypoplastic right vertebral artery grossly patent to the vertebrobasilar junction as well. Right PICA not visualized. Basilar patent to its distal aspect without stenosis. Superior cerebellar arteries patent bilaterally. Fetal type origin of the left PCA. Right PCA supplied via the basilar as well as a robust right posterior communicating artery. Scattered atheromatous irregularity within both PCAs without high-grade stenosis. Short-segment mild right P2 stenosis noted (series 10, image 19). Venous sinuses: Not well assessed due to timing of the contrast bolus. Anatomic variants: Fetal type origin of the left PCA. Hypoplastic right vertebral artery. 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 Review of the MIP images confirms the above findings CT Brain Perfusion Findings: ASPECTS: 10 CBF (<30%) Volume: 0mL Perfusion (Tmax>6.0s) volume: 18mL Mismatch Volume: 18mL Infarction Location:Negative CT perfusion for acute ischemia. Delayed perfusion seen within the right ACA distribution, in keeping with the chronic ACA occlusion. IMPRESSION: CTA HEAD AND NECK IMPRESSION: 1. Negative CTA for emergent large vessel occlusion. 2. 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 3. Scattered mild for age atheromatous change elsewhere about the major arterial vasculature of the head and neck. No other hemodynamically significant or correctable stenosis. 4. Few  scattered tree-in-bud nodular densities within right upper lobe, nonspecific, but could reflect changes of acute bronchiolitis and/or mucoid impaction. No frank airspace consolidation. CT PERFUSION IMPRESSION: 1. Negative CT perfusion for acute ischemia. 2. 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 Critical Value/emergent results were called by telephone at the time of interpretation on 11/09/2020 at  9:45 pm to provider ASHISH ARORA , who verbally acknowledged these results. Electronically Signed   By: Rise Mu M.D.   On: 11/09/2020 22:11   DG Pelvis 1-2 Views  Result Date: 11/09/2020 CLINICAL DATA:  Fall EXAM: PELVIS - 1-2 VIEW COMPARISON:  CT 03/22/2014 FINDINGS: Bones of the pelvis appear intact and congruent. Proximal femora intact and normally located within the acetabula. Moderate degenerative changes noted throughout the lower lumbar spine, bilateral SI joints, and bilateral hips. Some mild left hip soft tissue swelling is present. Finding on a background of diffuse mild body wall edema. Some hyperdense likely excreted contrast media seen within the urinary bladder. Slightly lobular, crenulated bladder contours and possible diverticular outpouchings, can be seen in the setting chronic outlet obstruction particularly in this patient with a history of prostatomegaly seen on comparison pelvic CT. No other acute or suspicious soft tissue abnormalities. IMPRESSION: 1. Mild left hip soft tissue swelling without acute fracture or traumatic malalignment. 2. Moderate degenerative changes throughout the spine, bilateral SI joints and hips. 3. Bladder opacified by excreted contrast media. Slightly lobular, crenulated bladder contours and possible diverticular outpouchings, can be seen in the setting of chronic outlet obstruction particularly in this patient with a history of prostatomegaly. Electronically Signed    By: Kreg Shropshire M.D.   On: 11/09/2020 22:32   DG Knee 1-2 Views Right  Result Date: 11/10/2020 CLINICAL DATA:  Found down, tender to palpation EXAM: RIGHT KNEE - 1-2 VIEW COMPARISON:  None. FINDINGS: Frontal and lateral views of the right knee demonstrate 3 compartmental osteoarthritis most severe in the medial compartment. No acute fracture, subluxation, or dislocation. No joint effusion. IMPRESSION: 1. Three compartmental osteoarthritis.  No acute fracture. Electronically Signed   By: Sharlet Salina M.D.   On: 11/10/2020 19:12   CT Code Stroke CTA Neck W/WO contrast  Result Date: 11/09/2020 CLINICAL DATA:  Initial evaluation for acute stroke, left-sided weakness. EXAM: CT ANGIOGRAPHY HEAD AND NECK CT PERFUSION BRAIN TECHNIQUE: Multidetector CT imaging of the head and neck was performed using the standard protocol during bolus administration of intravenous contrast. Multiplanar CT image reconstructions and MIPs were obtained to evaluate the vascular anatomy. Carotid stenosis measurements (when applicable) are obtained utilizing NASCET criteria, using the distal internal carotid diameter as the denominator. Multiphase CT imaging of the brain was performed following IV bolus contrast injection. Subsequent parametric perfusion maps were calculated using RAPID software. CONTRAST:  100 cc of Omnipaque 350. COMPARISON:  Prior head CT from earlier the same day. FINDINGS: CTA NECK FINDINGS Aortic arch: Visualized aortic arch of normal caliber with normal branch pattern. No hemodynamically significant stenosis about the origin of the great vessels. Visualized subclavian arteries widely patent. Right carotid system: Right common and internal carotid arteries widely patent without stenosis, dissection or occlusion. Left carotid system: Left CCA patent from its origin to the bifurcation without stenosis. Mild eccentric soft plaque at the origin of the left ICA without significant stenosis. Left ICA patent distally  without stenosis, dissection or occlusion. Vertebral arteries: Both vertebral arteries arise from the subclavian arteries. Dominant left vertebral artery with a diffusely hypoplastic right vertebral artery. Left vertebral artery widely patent within the neck without stenosis or other abnormality. Severe stenosis at the origin of the right vertebral artery. Hypoplastic right vertebral artery otherwise patent within the neck without stenosis or other abnormality. Skeleton: No visible acute osseous abnormality, although cervical spine better evaluated on concomitant CT of the cervical spine. No discrete or worrisome osseous lesions. Other  neck: No other acute soft tissue abnormality within the neck. No mass or adenopathy. Upper chest: Scattered atelectatic changes noted within the visualized lungs. Few scattered superimposed tree-in-bud nodular densities within the peripheral right upper lobe could reflect changes of acute bronchiolitis and/or mucoid impaction. No frank airspace consolidation. Review of the MIP images confirms the above findings CTA HEAD FINDINGS Anterior circulation: Petrous, cavernous, and supraclinoid segments of both internal carotid arteries are widely patent without stenosis. A1 segments patent bilaterally. Normal anterior communicating artery. 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 Finding presumably chronic in nature given the chronic bilateral ACA territory infarcts seen on prior noncontrast head CT. Scant distal reconstitution, likely collateral. M1 segments patent bilaterally. Normal MCA bifurcations. Distal MCA branches well perfused and symmetric. Posterior circulation: Dominant left vertebral artery widely patent to the vertebrobasilar junction. Left PICA patent. Hypoplastic right vertebral artery grossly patent to the vertebrobasilar junction as well. Right PICA not visualized. Basilar patent to its distal aspect without stenosis. Superior cerebellar arteries patent  bilaterally. Fetal type origin of the left PCA. Right PCA supplied via the basilar as well as a robust right posterior communicating artery. Scattered atheromatous irregularity within both PCAs without high-grade stenosis. Short-segment mild right P2 stenosis noted (series 10, image 19). Venous sinuses: Not well assessed due to timing of the contrast bolus. Anatomic variants: Fetal type origin of the left PCA. Hypoplastic right vertebral artery. 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 Review of the MIP images confirms the above findings CT Brain Perfusion Findings: ASPECTS: 10 CBF (<30%) Volume: 0mL Perfusion (Tmax>6.0s) volume: 18mL Mismatch Volume: 18mL Infarction Location:Negative CT perfusion for acute ischemia. Delayed perfusion seen within the right ACA distribution, in keeping with the chronic ACA occlusion. IMPRESSION: CTA HEAD AND NECK IMPRESSION: 1. Negative CTA for emergent large vessel occlusion. 2. 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 3. Scattered mild for age atheromatous change elsewhere about the major arterial vasculature of the head and neck. No other hemodynamically significant or correctable stenosis. 4. Few scattered tree-in-bud nodular densities within right upper lobe, nonspecific, but could reflect changes of acute bronchiolitis and/or mucoid impaction. No frank airspace consolidation. CT PERFUSION IMPRESSION: 1. Negative CT perfusion for acute ischemia. 2. 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 Critical Value/emergent results were called by telephone at the time of interpretation on 11/09/2020 at 9:45 pm to provider [facility] , who verbally acknowledged these results. Electronically Signed   By: Rise MuBenjamin  McClintock M.D.   On: 11/09/2020 22:11   MR BRAIN WO CONTRAST  Result Date: 11/10/2020 CLINICAL DATA:  Stroke follow-up. EXAM:  MRI HEAD WITHOUT CONTRAST TECHNIQUE: Multiplanar, multiecho pulse sequences of the brain and surrounding structures were obtained without intravenous contrast. COMPARISON:  Head CT November 09, 2020. FINDINGS: Incomplete study due to patient inability to lie still in the scanner for the duration the study. Chronic bilateral ACA territory infarct. T2 hyperintensity within the periventricular white matter, nonspecific, most likely related to chronic small vessel ischemia. Brain: No acute infarction, hemorrhage, hydrocephalus, extra-axial collection or mass lesion. Vascular: Normal flow voids at the skull base. Sinuses/Orbits: Opacification of the bilateral frontal sinuses. The orbits are grossly unremarkable. IMPRESSION: 1. Incomplete study . 2. No acute intracranial abnormality identified. 3. Bilateral frontal sinus disease. Electronically Signed   By: Baldemar LenisKatyucia  De Macedo Rodrigues M.D.   On: 11/10/2020 18:00   US RENAL  Result Date: 11/10/2020 CLINICAL  DATA:  Rule out hydronephrosis EXAM: RENAL / URINARY TRACT ULTRASOUND COMPLETE COMPARISON:  None. FINDINGS: Right Kidney: Renal measurements: 8.7 x 5.1 x 4.7 cm = volume: 109 mL. Echogenicity within normal limits. No mass or hydronephrosis visualized. Left Kidney: Renal measurements: 10.0 x 4.7 x 4.6 cm = volume: 112 mL. Echogenicity within normal limits. No mass or hydronephrosis visualized. Bladder: Decompressed with Foley catheter in place. Other: None. IMPRESSION: No acute findings.  No hydronephrosis. Electronically Signed   By: Charlett Nose M.D.   On: 11/10/2020 21:40   CT C-SPINE NO CHARGE  Result Date: 11/09/2020 CLINICAL DATA:  Left-sided weakness EXAM: CT CERVICAL SPINE WITHOUT CONTRAST TECHNIQUE: Multidetector CT imaging of the cervical spine was performed without intravenous contrast. Multiplanar CT image reconstructions were also generated. COMPARISON:  None. FINDINGS: Alignment: Alignment is anatomic. Skull base and vertebrae: No acute fracture. No  primary bone lesion or focal pathologic process. Soft tissues and spinal canal: Please refer to separately reported CT angiography of the neck for description of vascular findings. No prevertebral fluid or swelling. No visible canal hematoma. Disc levels: Bridging anterior osteophytes are seen throughout the entire cervical spine. Mild disc osteophyte complex at C4-5. No significant central canal or neural foraminal encroachment. Upper chest: Airway is patent. Lung apices are clear. Other: Reconstructed images confirm the above findings. IMPRESSION: 1. No acute cervical spine fracture. 2. Diffuse cervical spondylosis. No significant neural foraminal or central canal stenosis. Electronically Signed   By: Sharlet Salina M.D.   On: 11/09/2020 22:02   CT Code Stroke Cerebral Perfusion with contrast  Result Date: 11/09/2020 CLINICAL DATA:  Initial evaluation for acute stroke, left-sided weakness. EXAM: CT ANGIOGRAPHY HEAD AND NECK CT PERFUSION BRAIN TECHNIQUE: Multidetector CT imaging of the head and neck was performed using the standard protocol during bolus administration of intravenous contrast. Multiplanar CT image reconstructions and MIPs were obtained to evaluate the vascular anatomy. Carotid stenosis measurements (when applicable) are obtained utilizing NASCET criteria, using the distal internal carotid diameter as the denominator. Multiphase CT imaging of the brain was performed following IV bolus contrast injection. Subsequent parametric perfusion maps were calculated using RAPID software. CONTRAST:  100 cc of Omnipaque 350. COMPARISON:  Prior head CT from earlier the same day. FINDINGS: CTA NECK FINDINGS Aortic arch: Visualized aortic arch of normal caliber with normal branch pattern. No hemodynamically significant stenosis about the origin of the great vessels. Visualized subclavian arteries widely patent. Right carotid system: Right common and internal carotid arteries widely patent without stenosis,  dissection or occlusion. Left carotid system: Left CCA patent from its origin to the bifurcation without stenosis. Mild eccentric soft plaque at the origin of the left ICA without significant stenosis. Left ICA patent distally without stenosis, dissection or occlusion. Vertebral arteries: Both vertebral arteries arise from the subclavian arteries. Dominant left vertebral artery with a diffusely hypoplastic right vertebral artery. Left vertebral artery widely patent within the neck without stenosis or other abnormality. Severe stenosis at the origin of the right vertebral artery. Hypoplastic right vertebral artery otherwise patent within the neck without stenosis or other abnormality. Skeleton: No visible acute osseous abnormality, although cervical spine better evaluated on concomitant CT of the cervical spine. No discrete or worrisome osseous lesions. Other neck: No other acute soft tissue abnormality within the neck. No mass or adenopathy. Upper chest: Scattered atelectatic changes noted within the visualized lungs. Few scattered superimposed tree-in-bud nodular densities within the peripheral right upper lobe could reflect changes of acute bronchiolitis and/or mucoid impaction. No  frank airspace consolidation. Review of the MIP images confirms the above findings CTA HEAD FINDINGS Anterior circulation: Petrous, cavernous, and supraclinoid segments of both internal carotid arteries are widely patent without stenosis. A1 segments patent bilaterally. Normal anterior communicating artery. 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 Finding presumably chronic in nature given the chronic bilateral ACA territory infarcts seen on prior noncontrast head CT. Scant distal reconstitution, likely collateral. M1 segments patent bilaterally. Normal MCA bifurcations. Distal MCA branches well perfused and symmetric. Posterior circulation: Dominant left vertebral artery widely patent to the vertebrobasilar junction. Left  PICA patent. Hypoplastic right vertebral artery grossly patent to the vertebrobasilar junction as well. Right PICA not visualized. Basilar patent to its distal aspect without stenosis. Superior cerebellar arteries patent bilaterally. Fetal type origin of the left PCA. Right PCA supplied via the basilar as well as a robust right posterior communicating artery. Scattered atheromatous irregularity within both PCAs without high-grade stenosis. Short-segment mild right P2 stenosis noted (series 10, image 19). Venous sinuses: Not well assessed due to timing of the contrast bolus. Anatomic variants: Fetal type origin of the left PCA. Hypoplastic right vertebral artery. 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 Review of the MIP images confirms the above findings CT Brain Perfusion Findings: ASPECTS: 10 CBF (<30%) Volume: 91mL Perfusion (Tmax>6.0s) volume: 23mL Mismatch Volume: 36mL Infarction Location:Negative CT perfusion for acute ischemia. Delayed perfusion seen within the right ACA distribution, in keeping with the chronic ACA occlusion. IMPRESSION: CTA HEAD AND NECK IMPRESSION: 1. Negative CTA for emergent large vessel occlusion. 2. 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 3. Scattered mild for age atheromatous change elsewhere about the major arterial vasculature of the head and neck. No other hemodynamically significant or correctable stenosis. 4. Few scattered tree-in-bud nodular densities within right upper lobe, nonspecific, but could reflect changes of acute bronchiolitis and/or mucoid impaction. No frank airspace consolidation. CT PERFUSION IMPRESSION: 1. Negative CT perfusion for acute ischemia. 2. 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 Critical Value/emergent results were called by telephone at the time of interpretation on 11/09/2020 at 9:45 pm to provider  [facility] , who verbally acknowledged these results. Electronically Signed   By: Rise Mu M.D.   On: 11/09/2020 22:11   DG CHEST PORT 1 VIEW  Result Date: 11/11/2020 CLINICAL DATA:  PICC line placement EXAM: PORTABLE CHEST 1 VIEW COMPARISON:  11/09/2020 FINDINGS: Left upper extremity PICC line is been placed with its tip within the superior vena cava. Minimal left basilar atelectasis. Lungs are otherwise clear. No pneumothorax or pleural effusion. Cardiac size within normal limits. Pulmonary vascularity is normal. No acute bone abnormality. IMPRESSION: Left upper extremity PICC line tip within the superior vena cava. Electronically Signed   By: Helyn Numbers MD   On: 11/11/2020 13:12   DG Chest Port 1 View  Result Date: 11/09/2020 CLINICAL DATA:  Fall EXAM: PORTABLE CHEST 1 VIEW COMPARISON:  None. FINDINGS: The heart size and mediastinal contours are within normal limits. Both lungs are clear. The visualized skeletal structures are unremarkable. IMPRESSION: No active disease. Electronically Signed   By: Jonna Clark M.D.   On: 11/09/2020 22:38   DG Swallowing Func-Speech Pathology  Result Date: 11/11/2020 Objective Swallowing Evaluation: Type of Study: MBS-Modified Barium Swallow Study  Patient Details Name: Shayon Trompeter MRN: [id number] Date of Birth: April 11, 1951 Today's Date: 11/11/2020 Time: SLP Start Time (ACUTE ONLY): 1437 -SLP Stop  Time (ACUTE ONLY): 1455 SLP Time Calculation (min) (ACUTE ONLY): 18 min Past Medical History: Past Medical History: Diagnosis Date . Chronic systolic CHF (congestive heart failure) (HCC)   EF 35-40, diffuse HK, mild MR, moderate LAE, mild RAE, PASP 44, L pleural eff . Dilated cardiomyopathy (HCC)   likely related to tachycardia . Persistent atrial fibrillation [facility])  Past Surgical History: Past Surgical History: Procedure Laterality Date . CARDIOVERSION N/A 08/20/2017  Procedure: CARDIOVERSION;  Surgeon: [name], MD;  Location: [facility] ENDOSCOPY;  Service:  Cardiovascular;  Laterality: N/A; . INCISION AND DRAINAGE ABSCESS Left 03/22/2014  Procedure: INCISION AND DRAINAGE ABSCESS LEFT BUTTOCK ABSCESS;  Surgeon: [name], MD;  Location: [facility] OR;  Service: General;  Laterality: Left; HPI: Pt is a 70 y.o. male with medical history significant for atrial fibrillation on Eliquis-last dose unknown, dilated cardiomyopathy, chronic systolic CHF, CKD 3 who presented after being found on the floor at home by his friends who had last seen him one week prior. EMT noticed increased weakness on the left and he was brought in as a code stroke; this was ultimately cancelled by neurology. MRI was negative for acute changes. Pt found to have COVID-19. CXR 2/8 was negative.  No data recorded Assessment / Plan / Recommendation CHL IP CLINICAL IMPRESSIONS 11/11/2020 Clinical Impression Cervical osteophytes were noted, but did not significantly impact swallow function. Pt demonstrated decreased bolus cohesion, a pharyngeal delay, and incomplete epiglottic inverversion with the initial bolus of thin liquids via cup which was administered by the SLP. Premature spillage was noted to the pyriform sinuses and aspiration (PAS 7) noted thereafter secondary to the pharyngeal delay and incomplete epiglottic inversion. Coughing was noted and effective in expelling some of the aspirated material. Subsequent swallows were significant for prolonged mastication, and mildly reduced lingual retraction, but his swallow was notably more timely with self-feeding and no subsequent instances of penetration or aspiration were noted even when challenged with large sips and consecutive swallows. Mild to moderate vallecular residue was demonstrated with solids and was reduced to a functional level with a liquid wash. A dysphagia 3 diet with thin liquids is recommended at this time. Pt's swallow function and associated aspiration risk are much improved with self-feeding and it is recommended that this be  encouraged with supervision. SLP will follow to ensure diet tolerance. SLP Visit Diagnosis Dysphagia, unspecified (R13.10) Attention and concentration deficit following -- Frontal lobe and executive function deficit following -- Impact on safety and function Moderate aspiration risk;Mild aspiration risk   CHL IP TREATMENT RECOMMENDATION 11/11/2020 Treatment Recommendations Therapy as outlined in treatment plan below   Prognosis 11/11/2020 Prognosis for Safe Diet Advancement Good Barriers to Reach Goals Cognitive deficits Barriers/Prognosis Comment -- CHL IP DIET RECOMMENDATION 11/11/2020 SLP Diet Recommendations Dysphagia 3 (Mech soft) solids;Thin liquid Liquid Administration via Cup;Straw Medication Administration Whole meds with liquid Compensations Slow rate;Small sips/bites Postural Changes Remain semi-upright after after feeds/meals (Comment);Seated upright at 90 degrees   CHL IP OTHER RECOMMENDATIONS 11/11/2020 Recommended Consults -- Oral Care Recommendations Oral care BID Other Recommendations --   CHL IP FOLLOW UP RECOMMENDATIONS 11/11/2020 Follow up Recommendations (No Data)   CHL IP FREQUENCY AND DURATION 11/11/2020 Speech Therapy Frequency (ACUTE ONLY) min 2x/week Treatment Duration 2 weeks      CHL IP ORAL PHASE 11/11/2020 Oral Phase Impaired Oral - Pudding Teaspoon -- Oral - Pudding Cup -- Oral - Honey Teaspoon -- Oral - Honey Cup -- Oral - Nectar Teaspoon -- Oral - Nectar Cup [facility] Oral -  Nectar Straw WFL Oral - Thin Teaspoon -- Oral - Thin Cup Decreased bolus cohesion;Premature spillage Oral - Thin Straw WFL Oral - Puree WFL Oral - Mech Soft WFL Oral - Regular WFL Oral - Multi-Consistency -- Oral - Pill WFL Oral Phase - Comment --  CHL IP PHARYNGEAL PHASE 11/11/2020 Pharyngeal Phase Impaired Pharyngeal- Pudding Teaspoon -- Pharyngeal -- Pharyngeal- Pudding Cup -- Pharyngeal -- Pharyngeal- Honey Teaspoon -- Pharyngeal -- Pharyngeal- Honey Cup -- Pharyngeal -- Pharyngeal- Nectar Teaspoon -- Pharyngeal --  Pharyngeal- Nectar Cup Reduced tongue base retraction Pharyngeal -- Pharyngeal- Nectar Straw WFL Pharyngeal -- Pharyngeal- Thin Teaspoon -- Pharyngeal -- Pharyngeal- Thin Cup Reduced tongue base retraction;Delayed swallow initiation-pyriform sinuses;Pharyngeal residue - valleculae;Pharyngeal residue - pyriform;Penetration/Aspiration during swallow;Penetration/Apiration after swallow;Moderate aspiration Pharyngeal Material enters airway, passes BELOW cords and not ejected out despite cough attempt by patient Pharyngeal- Thin Straw -- Pharyngeal -- Pharyngeal- Puree -- Pharyngeal -- Pharyngeal- Mechanical Soft -- Pharyngeal -- Pharyngeal- Regular -- Pharyngeal -- Pharyngeal- Multi-consistency -- Pharyngeal -- Pharyngeal- Pill -- Pharyngeal -- Pharyngeal Comment --  No flowsheet data found. Scheryl Marten 11/11/2020, 3:52 PM              DG HIP UNILAT WITH PELVIS 2-3 VIEWS RIGHT  Result Date: 11/10/2020 CLINICAL DATA:  Found down, right hip tenderness EXAM: DG HIP (WITH OR WITHOUT PELVIS) 2-3V RIGHT COMPARISON:  None. FINDINGS: Frontal view of the pelvis including both hips as well as frontal and frogleg lateral views of the right hip are obtained. No fracture, subluxation, or dislocation. Joint spaces are well preserved. Soft tissues are normal. IMPRESSION: 1. No acute displaced fracture. Electronically Signed   By: Sharlet Salina M.D.   On: 11/10/2020 19:09   CT HEAD CODE STROKE WO CONTRAST  Result Date: 11/09/2020 CLINICAL DATA:  Code stroke. Initial evaluation for acute left-sided weakness, stroke suspected. EXAM: CT HEAD WITHOUT CONTRAST TECHNIQUE: Contiguous axial images were obtained from the base of the skull through the vertex without intravenous contrast. COMPARISON:  None available. FINDINGS: Brain: Age-related cerebral atrophy with chronic small vessel ischemic disease. Remote lacunar infarct present at the left caudate. Remote bilateral ACA territory infarcts noted. No acute intracranial  hemorrhage. No acute large vessel territory infarct. No mass lesion, mass effect or midline shift. Mild ventricular prominence related to global parenchymal volume loss without hydrocephalus. No extra-axial fluid collection. Vascular: No hyperdense vessel. Skull: Scalp soft tissues demonstrate no acute finding. Small lipoma noted at the posterior scalp. Calvarium intact. Sinuses/Orbits: Globes and orbital soft tissues within normal limits. Chronic frontal sinusitis noted. Scattered mucosal thickening noted elsewhere within the sphenoid ethmoidal sinuses. No mastoid effusion. Other: None. ASPECTS [facility] Stroke Program Early CT Score) - Ganglionic level infarction (caudate, lentiform nuclei, internal capsule, insula, M1-M3 cortex): 7 - Supraganglionic infarction (M4-M6 cortex): 3 Total score (0-10 with 10 being normal): 10 IMPRESSION: 1. No acute intracranial infarct or other abnormality. 2. ASPECTS is 10. 3. Remote bilateral ACA territory infarcts, with additional remote lacunar infarct at the left basal ganglia. 4. Underlying atrophy with chronic small vessel ischemic disease. These results were communicated to Dr. Wilford Corner at 9:31 pmon 2/8/2022by text page via the [facility] messaging system. Electronically Signed   By: Rise Mu M.D.   On: 11/09/2020 21:51   ECHOCARDIOGRAM LIMITED  Result Date: 11/10/2020    ECHOCARDIOGRAM LIMITED REPORT   Patient Name:   ZACKORY PUDLO  Date of Exam: 11/10/2020 Medical Rec #:  [id number]  Height:       72.0 in Accession #:  [id number] Weight:       217.6 lb Date of Birth:  1950-12-04 BSA:          2.208 m Patient Age:    69 years   BP:           138/94 mmHg Patient Gender: M          HR:           138 bpm. Exam Location:  Inpatient Procedure: Limited Echo, Cardiac Doppler and Color Doppler Indications:    Atrial fibrillation  History:        Patient has prior history of Echocardiogram examinations, most                 recent 07/19/2017. CHF; Arrythmias:Atrial Fibrillation.  Dilated                 cardiomyopathy. CKD.  Sonographer:    Ross Ludwig RDCS (AE) Referring Phys: [id number] [facility]  Sonographer Comments: Suboptimal parasternal window. IMPRESSIONS  1. Left ventricular ejection fraction, by estimation, is 60 to 65%. The left ventricle has normal function. The left ventricle has no regional wall motion abnormalities. There is moderate left ventricular hypertrophy. Left ventricular diastolic parameters are indeterminate.  2. Right ventricular systolic function is normal. The right ventricular size is normal.  3. Left atrial size was mildly dilated.  4. The mitral valve is normal in structure. Trivial mitral valve regurgitation. No evidence of mitral stenosis.  5. The aortic valve was not well visualized. Aortic valve regurgitation is not visualized. No aortic stenosis is present.  6. The inferior vena cava is dilated in size with >50% respiratory variability, suggesting right atrial pressure of 8 mmHg. FINDINGS  Left Ventricle: Left ventricular ejection fraction, by estimation, is 60 to 65%. The left ventricle has normal function. The left ventricle has no regional wall motion abnormalities. The left ventricular internal cavity size was normal in size. There is  moderate left ventricular hypertrophy. Left ventricular diastolic parameters are indeterminate. Right Ventricle: The right ventricular size is normal. No increase in right ventricular wall thickness. Right ventricular systolic function is normal. Left Atrium: Left atrial size was mildly dilated. Right Atrium: Right atrial size was normal in size. Pericardium: There is no evidence of pericardial effusion. Mitral Valve: The mitral valve is normal in structure. Trivial mitral valve regurgitation. No evidence of mitral valve stenosis. Tricuspid Valve: The tricuspid valve is normal in structure. Tricuspid valve regurgitation is mild . No evidence of tricuspid stenosis. Aortic Valve: The aortic valve was not well  visualized. Aortic valve regurgitation is not visualized. No aortic stenosis is present. Pulmonic Valve: The pulmonic valve was normal in structure. Pulmonic valve regurgitation is not visualized. No evidence of pulmonic stenosis. Aorta: The aortic root is normal in size and structure. Venous: The inferior vena cava is dilated in size with greater than 50% respiratory variability, suggesting right atrial pressure of 8 mmHg. IAS/Shunts: No atrial level shunt detected by color flow Doppler. LEFT VENTRICLE PLAX 2D LVIDd:         4.80 cm LVIDs:         3.40 cm LV PW:         1.60 cm LV IVS:        1.50 cm LVOT diam:     2.10 cm LVOT Area:     3.46 cm  IVC IVC diam: 1.70 cm LEFT ATRIUM         Index LA diam:    3.90  cm 1.77 cm/m   AORTA Ao Root diam: 3.60 cm  SHUNTS Systemic Diam: 2.10 cm Charlton Haws MD Electronically signed by Charlton Haws MD Signature Date/Time: 11/10/2020/3:42:53 PM    Final    [place] EKG SITE RITE  Result Date: 11/11/2020 If Site Rite image not attached, placement could not be confirmed due to current cardiac rhythm.   Assessment/Plan  1. Atrial fibrillation with RVR (HCC) -   Will continue Eliquis for anticoagulation and metoprolol tartrate for rate control  2. Acute renal failure superimposed on stage 3a chronic kidney disease, unspecified acute renal failure type [facility]) Lab Results  Component Value Date   NA 141 11/16/2020   K 3.8 11/16/2020   CO2 21 (L) 11/16/2020   GLUCOSE 118 (H) 11/16/2020   BUN 24 (H) 11/16/2020   CREATININE 1.22 11/16/2020   CALCIUM 8.0 (L) 11/16/2020   GFRNONAA >60 11/16/2020   GFRAA 56 (L) 06/08/2020   -  Improved, will monitor  3. COVID-19 virus infection -   Vaccinated but not boostered, chest x-ray negative for pneumonia -   Treated with remdesivir 3 days, will need a total of 10 days isoltion  4. Urinary retention -   Has foley catheter, will do voiding trial in 1 week -   Continue Flomax  5. Weakness of both lower extremities -    MRI  no evidence of acute CVA or acute findings -    For PT and OT, for therapeutic strengthening exercises    Family/ staff Communication:   Discussed plan of care with charge nurse.  Labs/tests ordered: None  Goals of care:   Short-term care   Kenard Gower, DNP, MSN, FNP-BC [facility] [phone] (Monday-Friday 8:00 a.m. - 5:00 p.m.) [phone] (after hours)

## 2020-11-18 ENCOUNTER — Encounter: Payer: Self-pay | Admitting: Internal Medicine

## 2020-11-18 ENCOUNTER — Non-Acute Institutional Stay (SKILLED_NURSING_FACILITY): Payer: Medicare Other | Admitting: Internal Medicine

## 2020-11-18 DIAGNOSIS — I4891 Unspecified atrial fibrillation: Secondary | ICD-10-CM | POA: Diagnosis not present

## 2020-11-18 DIAGNOSIS — R29818 Other symptoms and signs involving the nervous system: Secondary | ICD-10-CM

## 2020-11-18 DIAGNOSIS — L0231 Cutaneous abscess of buttock: Secondary | ICD-10-CM

## 2020-11-18 DIAGNOSIS — N179 Acute kidney failure, unspecified: Secondary | ICD-10-CM | POA: Diagnosis not present

## 2020-11-18 DIAGNOSIS — R339 Retention of urine, unspecified: Secondary | ICD-10-CM | POA: Diagnosis not present

## 2020-11-18 DIAGNOSIS — M1A0720 Idiopathic chronic gout, left ankle and foot, without tophus (tophi): Secondary | ICD-10-CM | POA: Diagnosis not present

## 2020-11-18 DIAGNOSIS — G9341 Metabolic encephalopathy: Secondary | ICD-10-CM

## 2020-11-18 DIAGNOSIS — R4189 Other symptoms and signs involving cognitive functions and awareness: Secondary | ICD-10-CM | POA: Diagnosis not present

## 2020-11-18 DIAGNOSIS — N183 Chronic kidney disease, stage 3 unspecified: Secondary | ICD-10-CM | POA: Diagnosis not present

## 2020-11-18 DIAGNOSIS — U071 COVID-19: Secondary | ICD-10-CM | POA: Diagnosis not present

## 2020-11-18 DIAGNOSIS — E870 Hyperosmolality and hypernatremia: Secondary | ICD-10-CM | POA: Diagnosis not present

## 2020-11-18 NOTE — Assessment & Plan Note (Signed)
Booster recommended once clinically stable.

## 2020-11-18 NOTE — Assessment & Plan Note (Signed)
Current pulse 100; but blood pressure soft with a value of 96/64.  No changes will be made in the metoprolol dose.  Eliquis will be continued.

## 2020-11-18 NOTE — Assessment & Plan Note (Signed)
Urology referral if he fails voiding trial

## 2020-11-18 NOTE — Progress Notes (Signed)
NURSING HOME LOCATION:  [facility] ROOM NUMBER:  310/A  CODE STATUS:  Full Code  PCP:  Hoy Register, MD  This is a comprehensive admission note to [facility] performed on this date less than 30 days from date of admission. Included are preadmission medical/surgical history; reconciled medication list; family history; social history and comprehensive review of systems.  Corrections and additions to the records were documented. Comprehensive physical exam was also performed. Additionally a clinical summary was entered for each active diagnosis pertinent to this admission in the Problem List to enhance continuity of care.  HPI: The patient was hospitalized 2/8-2/15/2022 after being found on the floor of his home by friends. By history he had not been seen for at least a week ;therefore, they went there to check on him.  When there was no answer they apparently knocked the door down only to find him on the bedroom floor between the dresser and bed, lying in urine and stool..  At that time he was nonvocal with diffuse weakness.  EMT felt there was asymmetric weakness, greater on the left and transported him to the ED as a code stroke.  There he was unable to provide any history and only follow simple commands. CT of the head was negative for acute stroke. MRI was performed but the study was incomplete.  There was no definitive acute CVA.  EEG was normal with no seizure discharges.  Tachycardia was present with heart rates in the range of 160-180 with EKG documented A. fib with RVR.  Cardizem infusion was initiated.Tachycardia improved with the drip and he was transitioned to oral Toprol. Midodrine was initiated because of soft blood pressures.  Eliquis was continued. Sodium was 168; AKI on CKD stage III was present.  Renal ultrasound revealed no hydronephrosis.  Troponin was also elevated to 56; CK was normal. An unstageable pressure ulcer was present over the right gluteus.  Wound  Care was consulted and hydrotherapy initiated. Foley catheter was necessary on admission for urinary retention.  Wounds were also noted at the glans penis around the Foley insertion site.  Urologist Dr. Liliane Shi recommended local wound care with cleansing and Neosporin. He failed voiding trial requiring reinsertion on 2/13.  Outpatient urologic follow-up was to be pursued if he failed voiding trial 5-7 days post discharge. Course was complicated by infiltration at IV site in the right upper extremity.  Elevation of extremity was initiated; there was no evidence clinically of infection. The hyponatremia was attributed to hypovolemia in the setting of volume depletion and dehydration.  Gradual correction was pursued with normalization of the sodium. With correction of the hyponatremia mental status improved significantly.  He continued to have bilateral lower extremity weakness with left wrist drop attributed to radial nerve compression while on the floor for extended period of time.  Bilateral lower extremity weakness was consistent with chronic ACA bilateral infarcts.  PT/OT recommended SNF placement for rehab. Covid screening was positive; by history he had been vaccinated X 2 last year but had not received a booster.  There was no pneumonia on chest x-ray and he did not require O2.  Mild ALT elevation was attributed to Covid.  Isolation was in place for 10 days from the date of the positive test. He received Rocephin for 3 days for possible UTI; but urine culture revealed multiple species.  Past medical and surgical history: Includes persistent A. fib, history of dilated cardiomyopathy, and chronic systolic congestive heart failure with ejection fraction 35-40%. Procedures include  I&D of an abscess of the buttocks in 2015 and cardioversion in 2018.  Social history: Social drinker; never smoked.  Today he states he does not drink and validates that he has never smoked.  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  Family history: Limited history reviewed.   Review of systems: He stated that he was "doing great".  Review of systems was negative including cardiopulmonary and Covid related symptoms.  When I asked the date the response was "damn".  He named the president as " that white headed blank blank(a profane curse)".  He states that he has forgotten his doctor's name.  He could give me no history pertaining to his fall.  He stated that he had been on the ground "for 15 days".  Constitutional: No fever, significant weight change, fatigue  Eyes: No redness, discharge, pain, vision change ENT/mouth: No nasal congestion, purulent discharge, earache, change in hearing, sore throat  Cardiovascular: No chest pain, palpitations, paroxysmal nocturnal dyspnea, claudication, edema  Respiratory: No cough, sputum production, hemoptysis, DOE, significant snoring, apnea Gastrointestinal: No heartburn, dysphagia, abdominal pain, nausea /vomiting, rectal bleeding, melena, change in bowels Genitourinary: No dysuria, hematuria, pyuria, incontinence, nocturia Musculoskeletal: No joint stiffness, joint swelling, weakness, pain Dermatologic: No rash, pruritus, change in appearance of skin Neurologic: No dizziness, headache, syncope, seizures, numbness, tingling Psychiatric: No significant anxiety, depression, insomnia, anorexia Endocrine: No change in hair/skin/nails, excessive thirst, excessive hunger, excessive urination  Hematologic/lymphatic: No significant bruising, lymphadenopathy, abnormal bleeding Allergy/immunology: No itchy/watery eyes, significant sneezing, urticaria, angioedema  Physical exam:  Pertinent or positive findings: He appears chronically ill.  Facies are blank.  He is Transport planner.  Chest is barrel-shaped.  Heart sounds are profoundly distant and basically undiscernible.  Foley catheter is present.  He has 1/2+ edema at the ankle.  Pedal pulses are decreased.  He is  profoundly weak to opposition in all extremities.  General appearance: no acute distress, increased work of breathing is present.   Lymphatic: No lymphadenopathy about the head, neck, axilla. Eyes: No conjunctival inflammation or lid edema is present. There is no scleral icterus. Ears:  External ear exam shows no significant lesions or deformities.   Nose:  External nasal examination shows no deformity or inflammation. Nasal mucosa are pink and moist without lesions, exudates Oral exam: Lips and gums are healthy appearing.There is no oropharyngeal erythema or exudate. Neck:  No thyromegaly, masses, tenderness noted.    Lungs:  without wheezes, rhonchi, rales, rubs. Abdomen: Bowel sounds are normal.  Abdomen is soft and nontender with no organomegaly, hernias, masses. GU: Deferred  Extremities:  No cyanosis, clubbing. Neurologic exam: Balance, Rhomberg, finger to nose testing could not be completed due to clinical state Skin: Warm & dry w/o tenting. No significant visable rash.  See clinical summary under each active problem in the Problem List with associated updated therapeutic plan

## 2020-11-18 NOTE — Assessment & Plan Note (Signed)
Wound care nurse will monitor at the SNF.  Wound care clinic referral if progressive.

## 2020-11-18 NOTE — Assessment & Plan Note (Deleted)
Current pulse 100; but blood pressure soft with a value of 96/64.  No changes will be made in the metoprolol dose.  Eliquis will be continued. 

## 2020-11-18 NOTE — Assessment & Plan Note (Signed)
BIMS MMSE score of 5 indicating significant neurocognitive deficit.  Recent Covid infection may be playing a role but significant dementia suggested based on 2/17 exam.  He was unable to give me any history and unable to provide the date or name of the president.

## 2020-11-18 NOTE — Assessment & Plan Note (Addendum)
He remains on colchicine and allopurinol.  The most recent uric acid level was 6.1 on  06/08/2020.  This will be updated to see if the gout medications can be D/Ced.

## 2020-11-18 NOTE — Patient Instructions (Signed)
See assessment and plan under each diagnosis in the problem list and acutely for this visit 

## 2020-11-19 ENCOUNTER — Encounter: Payer: Self-pay | Admitting: Internal Medicine

## 2020-11-19 NOTE — Assessment & Plan Note (Signed)
Periodic reassessment indicated as progression with incompetency unfortunately likely

## 2020-11-19 NOTE — Assessment & Plan Note (Signed)
Monitor BMET. 

## 2020-11-19 NOTE — Assessment & Plan Note (Signed)
Decrease high dose Allopurinol & update uric acid

## 2020-11-22 LAB — COMPREHENSIVE METABOLIC PANEL
Calcium: 8.2 — AB (ref 8.7–10.7)
GFR calc Af Amer: 80.25
GFR calc non Af Amer: 69.24

## 2020-11-22 LAB — BASIC METABOLIC PANEL
BUN: 14 (ref 4–21)
CO2: 25 — AB (ref 13–22)
Chloride: 103 (ref 99–108)
Creatinine: 1.1 (ref 0.6–1.3)
Glucose: 95
Potassium: 4.7 (ref 3.4–5.3)
Sodium: 140 (ref 137–147)

## 2020-11-29 ENCOUNTER — Non-Acute Institutional Stay (SKILLED_NURSING_FACILITY): Payer: Medicare Other | Admitting: Adult Health

## 2020-11-29 DIAGNOSIS — L089 Local infection of the skin and subcutaneous tissue, unspecified: Secondary | ICD-10-CM | POA: Diagnosis not present

## 2020-11-29 DIAGNOSIS — T148XXA Other injury of unspecified body region, initial encounter: Secondary | ICD-10-CM

## 2020-11-29 DIAGNOSIS — R339 Retention of urine, unspecified: Secondary | ICD-10-CM

## 2020-12-03 ENCOUNTER — Encounter: Payer: Self-pay | Admitting: Adult Health

## 2020-12-03 ENCOUNTER — Other Ambulatory Visit: Payer: Self-pay | Admitting: Adult Health

## 2020-12-03 MED ORDER — TRAMADOL HCL 50 MG PO TABS: ORAL_TABLET | ORAL | 0 refills | Status: DC

## 2020-12-03 NOTE — Progress Notes (Signed)
Location:  [facility] Room Number: 310 A Place of Service:  SNF (31) Provider:  Kenard GowerMedina-Vargas, Monina, DNP, FNP-BC  Patient Care Team: Hoy RegisterNewlin, Enobong, MD as PCP - General (Family Medicine)  Extended Emergency Contact Information Primary Emergency Contact: Peterson [place], [place]  [place] Home Phone: [phone] Relation: Daughter Secondary Emergency Contact: SAPP,RON Home Phone: [phone] Relation: Other  Code Status:    Goals of care: Advanced Directive information Advanced Directives 11/18/2020  Does Patient Have a Medical Advance Directive? No  Would patient like information on creating a medical advance directive? No - Patient declined  Pre-existing out of facility DNR order (yellow form or pink MOST form) -     Chief Complaint  Patient presents with  . Acute Visit    Increase in wound drainage     HPI:  Pt is a 70 y.o. male seen today for increase in wound drainage. [facility] with a right buttock wound. Wound drainage noted to be greenish and has a foul odor. It is being treated daily with Santyl. No reported fever nor chills. He has foley catheter due to urinary retention. He takes Tamsulosin 0.4 mg at dinner time.    Past Medical History:  Diagnosis Date  . Chronic systolic CHF (congestive heart failure) (HCC)    EF 35-40, diffuse HK, mild MR, moderate LAE, mild RAE, PASP 44, L pleural eff  . Dilated cardiomyopathy (HCC)    likely related to tachycardia  . Persistent atrial fibrillation [facility](HCC)    Past Surgical History:  Procedure Laterality Date  . CARDIOVERSION N/A 08/20/2017   Procedure: CARDIOVERSION;  Surgeon: Jake BatheSkains, Mark C, MD;  [facility];  Service: Cardiovascular;  Laterality: N/A;  . INCISION AND DRAINAGE ABSCESS Left 03/22/2014   Procedure: INCISION AND DRAINAGE ABSCESS LEFT BUTTOCK ABSCESS;  Surgeon: Liz MaladyBurke E Thompson, MD;  Location: [facility] OR;  Service: General;   Laterality: Left;    No Known Allergies  Outpatient Encounter Medications as of 11/29/2020  Medication Sig  . acetaminophen (TYLENOL) 650 MG suppository Place 1 suppository (650 mg total) rectally every 6 (six) hours as needed for fever.  Marland Kitchen. allopurinol (ZYLOPRIM) 300 MG tablet Take 1 tablet (300 mg total) by mouth 2 (two) times daily.  Marland Kitchen. apixaban (ELIQUIS) 5 MG TABS tablet Take 1 tablet (5 mg total) by mouth 2 (two) times daily.  Marland Kitchen. atorvastatin (LIPITOR) 20 MG tablet Take 1 tablet (20 mg total) by mouth daily.  . colchicine 0.6 MG tablet Take 0.6 mg by mouth. Take 1 capsule 1 hour after taking 2 capsule if GOUT symptoms have not subsided  . colchicine 0.6 MG tablet Take 1.2 mg by mouth daily. At the onset of a GOUT Flare as needed  . collagenase (SANTYL) ointment Apply topically daily. Apply to gluteal pressure ulcers daily.  . feeding supplement (ENSURE ENLIVE / ENSURE PLUS) LIQD Take 237 mLs by mouth 2 (two) times daily between meals.  . metoprolol tartrate (LOPRESSOR) 25 MG tablet Take 1 tablet (25 mg total) by mouth 2 (two) times daily.  . midodrine (PROAMATINE) 2.5 MG tablet Take 1 tablet (2.5 mg total) by mouth 3 (three) times daily with meals.  Marland Kitchen. neomycin-bacitracin-polymyxin (NEOSPORIN) OINT Apply 1 application topically 2 (two) times daily. Please apply to glans penis wound BID.  Marland Kitchen. pantoprazole (PROTONIX) 40 MG tablet Take 1 tablet (40 mg total) by mouth daily.  . tamsulosin (FLOMAX) 0.4 MG CAPS capsule Take 1 capsule (0.4 mg total)  by mouth daily after supper.   No facility-administered encounter medications on file as of 11/29/2020.    Review of Systems  Unable to obtain due to neurocognitive deficit   Immunization History  Administered Date(s) Administered  . Pneumococcal Conjugate-13 10/28/2018  . Pneumococcal Polysaccharide-23 09/08/2020   Pertinent  Health Maintenance Due  Topic Date Due  . COLONOSCOPY (Pts 45-22yrs Insurance coverage will need to be confirmed)  Never  done  . INFLUENZA VACCINE  12/30/2020 (Originally 05/02/2020)  . PNA vac Low Risk Adult  Completed   Fall Risk  09/08/2020 06/08/2020 10/28/2018 07/10/2018 04/09/2018  Falls in the past year? 0 0 0 No No  Number falls in past yr: 0 - - - -  Injury with Fall? 0 - - - -  Risk for fall due to : - No Fall Risks - - -     Vitals:   11/29/20 1421  BP: 96/64  Pulse: 100  Resp: 20  Temp: 97.7 F (36.5 C)  Weight: 193 lb (87.5 kg)  Height: 6' (1.829 m)   Body mass index is 26.18 kg/m.  Physical Exam  GENERAL APPEARANCE: Well nourished. In no acute distress. Normal body habitus SKIN:  Right buttock wound, see HPI MOUTH and THROAT: Lips are without lesions. Oral mucosa is moist and without lesions.  RESPIRATORY: Breathing is even & unlabored, BS CTAB CARDIAC: no murmur,no extra heart sounds, no edema GI: Abdomen soft, normal BS, no masses, no tenderness GU:  Has foley catheter. NEUROLOGICAL: There is no tremor. Speech is clear.  PSYCHIATRIC:  Affect and behavior are appropriate  Labs reviewed: Recent Labs    11/10/20 1508 11/10/20 2000 11/14/20 0310 11/15/20 0306 11/16/20 0310  NA 167*   < > 148* 142 141  K 4.1   < > 3.9 3.7 3.8  CL >130*   < > 119* 114* 114*  CO2 20*   < > 21* 21* 21*  GLUCOSE 152*   < > 122* 202* 118*  BUN 85*   < > 37* 28* 24*  CREATININE 2.58*   < > 1.41* 1.46* 1.22  CALCIUM 8.5*   < > 8.3* 8.0* 8.0*  MG  --   --  2.2  --   --   PHOS 3.5  --   --   --   --    < > = values in this interval not displayed.   Recent Labs    11/12/20 0345 11/13/20 0304 11/14/20 0310  AST 27 32 38  ALT 26 41 47*  ALKPHOS 56 64 71  BILITOT 0.6 0.7 0.7  PROT 4.6* 4.8* 4.8*  ALBUMIN 2.2* 2.1* 2.0*   Recent Labs    11/12/20 0345 11/13/20 0304 11/14/20 0310 11/16/20 0310  WBC 8.6 6.8 9.4 10.1  NEUTROABS 7.2 5.5 7.5  --   HGB 11.7* 11.9* 12.3* 10.5*  HCT 36.7* 35.9* 37.0* 31.5*  MCV 89.5 86.5 85.1 85.1  PLT 208 191 214 225   Lab Results  Component Value Date    TSH 2.627 11/14/2020   Lab Results  Component Value Date   HGBA1C 5.3 06/08/2020   Lab Results  Component Value Date   CHOL 139 10/28/2018   HDL 39 (L) 10/28/2018   LDLCALC 65 10/28/2018   TRIG 174 (H) 10/28/2018   CHOLHDL 3.6 10/28/2018    Significant Diagnostic Results in last 30 days:  EEG  Result Date: 11/12/2020 Charlsie Quest, MD     11/12/2020 10:37 AM Patient Name: Jeffery Hidden  Christian MRN: [id number] Epilepsy Attending: Charlsie Quest Referring Physician/Provider: Dr Caryl Pina Date: 11/12/2020 Duration: 23.19 mins Patient history: 70 yo M with ams. EEG to evaluate for seizure Level of alertness: Awake, drowsy, sleep, comatose, lethargic AEDs during EEG study: None Technical aspects: This EEG study was done with scalp electrodes positioned according to the 10-20 International system of electrode placement. Electrical activity was acquired at a sampling rate of 500Hz  and reviewed with a high frequency filter of 70Hz  and a low frequency filter of 1Hz . EEG data were recorded continuously and digitally stored. Description: The posterior dominant rhythm consists of 8-9 Hz activity of moderate voltage (25-35 uV) seen predominantly in posterior head regions, symmetric and reactive to eye opening and eye closing. Hyperventilation and photic stimulation were not performed.   IMPRESSION: This study is within normal limits. No seizures or epileptiform discharges were seen throughout the recording.   CT Code Stroke CTA Head W/WO contrast  Result Date: 11/09/2020 CLINICAL DATA:  Initial evaluation for acute stroke, left-sided weakness. EXAM: CT ANGIOGRAPHY HEAD AND NECK CT PERFUSION BRAIN TECHNIQUE: Multidetector CT imaging of the head and neck was performed using the standard protocol during bolus administration of intravenous contrast. Multiplanar CT image reconstructions and MIPs were obtained to evaluate the vascular anatomy. Carotid stenosis measurements (when applicable) are obtained  utilizing NASCET criteria, using the distal internal carotid diameter as the denominator. Multiphase CT imaging of the brain was performed following IV bolus contrast injection. Subsequent parametric perfusion maps were calculated using RAPID software. CONTRAST:  100 cc of Omnipaque 350. COMPARISON:  Prior head CT from earlier the same day. FINDINGS: CTA NECK FINDINGS Aortic arch: Visualized aortic arch of normal caliber with normal branch pattern. No hemodynamically significant stenosis about the origin of the great vessels. Visualized subclavian arteries widely patent. Right carotid system: Right common and internal carotid arteries widely patent without stenosis, dissection or occlusion. Left carotid system: Left CCA patent from its origin to the bifurcation without stenosis. Mild eccentric soft plaque at the origin of the left ICA without significant stenosis. Left ICA patent distally without stenosis, dissection or occlusion. Vertebral arteries: Both vertebral arteries arise from the subclavian arteries. Dominant left vertebral artery with a diffusely hypoplastic right vertebral artery. Left vertebral artery widely patent within the neck without stenosis or other abnormality. Severe stenosis at the origin of the right vertebral artery. Hypoplastic right vertebral artery otherwise patent within the neck without stenosis or other abnormality. Skeleton: No visible acute osseous abnormality, although cervical spine better evaluated on concomitant CT of the cervical spine. No discrete or worrisome osseous lesions. Other neck: No other acute soft tissue abnormality within the neck. No mass or adenopathy. Upper chest: Scattered atelectatic changes noted within the visualized lungs. Few scattered superimposed tree-in-bud nodular densities within the peripheral right upper lobe could reflect changes of acute bronchiolitis and/or mucoid impaction. No frank airspace consolidation. Review of the MIP images confirms the  above findings CTA HEAD FINDINGS Anterior circulation: Petrous, cavernous, and supraclinoid segments of both internal carotid arteries are widely patent without stenosis. A1 segments patent bilaterally. Normal anterior communicating artery. 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 Finding presumably chronic in nature given the chronic bilateral ACA territory infarcts seen on prior noncontrast head CT. Scant distal reconstitution, likely collateral. M1 segments patent bilaterally. Normal MCA bifurcations. Distal MCA branches well perfused and symmetric. Posterior circulation: Dominant left vertebral artery widely patent to the vertebrobasilar junction. Left PICA patent. Hypoplastic right vertebral artery  grossly patent to the vertebrobasilar junction as well. Right PICA not visualized. Basilar patent to its distal aspect without stenosis. Superior cerebellar arteries patent bilaterally. Fetal type origin of the left PCA. Right PCA supplied via the basilar as well as a robust right posterior communicating artery. Scattered atheromatous irregularity within both PCAs without high-grade stenosis. Short-segment mild right P2 stenosis noted (series 10, image 19). Venous sinuses: Not well assessed due to timing of the contrast bolus. Anatomic variants: Fetal type origin of the left PCA. Hypoplastic right vertebral artery. 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 Review of the MIP images confirms the above findings CT Brain Perfusion Findings: ASPECTS: 10 CBF (<30%) Volume: 22mL Perfusion (Tmax>6.0s) volume: 41mL Mismatch Volume: 68mL Infarction Location:Negative CT perfusion for acute ischemia. Delayed perfusion seen within the right ACA distribution, in keeping with the chronic ACA occlusion. IMPRESSION: CTA HEAD AND NECK IMPRESSION: 1. Negative CTA for emergent large vessel occlusion. 2. 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 3. Scattered mild for age  atheromatous change elsewhere about the major arterial vasculature of the head and neck. No other hemodynamically significant or correctable stenosis. 4. Few scattered tree-in-bud nodular densities within right upper lobe, nonspecific, but could reflect changes of acute bronchiolitis and/or mucoid impaction. No frank airspace consolidation. CT PERFUSION IMPRESSION: 1. Negative CT perfusion for acute ischemia. 2. 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 Critical Value/emergent results were called by telephone at the time of interpretation on 11/09/2020 at 9:45 pm to provider [facility] , who verbally acknowledged these results. Electronically Signed   By: Rise Mu M.D.   On: 11/09/2020 22:11   DG Pelvis 1-2 Views  Result Date: 11/09/2020 CLINICAL DATA:  Fall EXAM: PELVIS - 1-2 VIEW COMPARISON:  CT 03/22/2014 FINDINGS: Bones of the pelvis appear intact and congruent. Proximal femora intact and normally located within the acetabula. Moderate degenerative changes noted throughout the lower lumbar spine, bilateral SI joints, and bilateral hips. Some mild left hip soft tissue swelling is present. Finding on a background of diffuse mild body wall edema. Some hyperdense likely excreted contrast media seen within the urinary bladder. Slightly lobular, crenulated bladder contours and possible diverticular outpouchings, can be seen in the setting chronic outlet obstruction particularly in this patient with a history of prostatomegaly seen on comparison pelvic CT. No other acute or suspicious soft tissue abnormalities. IMPRESSION: 1. Mild left hip soft tissue swelling without acute fracture or traumatic malalignment. 2. Moderate degenerative changes throughout the spine, bilateral SI joints and hips. 3. Bladder opacified by excreted contrast media. Slightly lobular, crenulated bladder contours and possible  diverticular outpouchings, can be seen in the setting of chronic outlet obstruction particularly in this patient with a history of prostatomegaly. Electronically Signed   By: Kreg Shropshire M.D.   On: 11/09/2020 22:32   DG Knee 1-2 Views Right  Result Date: 11/10/2020 CLINICAL DATA:  Found down, tender to palpation EXAM: RIGHT KNEE - 1-2 VIEW COMPARISON:  None. FINDINGS: Frontal and lateral views of the right knee demonstrate 3 compartmental osteoarthritis most severe in the medial compartment. No acute fracture, subluxation, or dislocation. No joint effusion. IMPRESSION: 1. Three compartmental osteoarthritis.  No acute fracture. Electronically Signed   By: Sharlet Salina M.D.   On: 11/10/2020 19:12   CT Code Stroke CTA Neck W/WO contrast  Result Date: 11/09/2020 CLINICAL DATA:  Initial evaluation for acute stroke, left-sided weakness. EXAM:  CT ANGIOGRAPHY HEAD AND NECK CT PERFUSION BRAIN TECHNIQUE: Multidetector CT imaging of the head and neck was performed using the standard protocol during bolus administration of intravenous contrast. Multiplanar CT image reconstructions and MIPs were obtained to evaluate the vascular anatomy. Carotid stenosis measurements (when applicable) are obtained utilizing NASCET criteria, using the distal internal carotid diameter as the denominator. Multiphase CT imaging of the brain was performed following IV bolus contrast injection. Subsequent parametric perfusion maps were calculated using RAPID software. CONTRAST:  100 cc of Omnipaque 350. COMPARISON:  Prior head CT from earlier the same day. FINDINGS: CTA NECK FINDINGS Aortic arch: Visualized aortic arch of normal caliber with normal branch pattern. No hemodynamically significant stenosis about the origin of the great vessels. Visualized subclavian arteries widely patent. Right carotid system: Right common and internal carotid arteries widely patent without stenosis, dissection or occlusion. Left carotid system: Left CCA patent  from its origin to the bifurcation without stenosis. Mild eccentric soft plaque at the origin of the left ICA without significant stenosis. Left ICA patent distally without stenosis, dissection or occlusion. Vertebral arteries: Both vertebral arteries arise from the subclavian arteries. Dominant left vertebral artery with a diffusely hypoplastic right vertebral artery. Left vertebral artery widely patent within the neck without stenosis or other abnormality. Severe stenosis at the origin of the right vertebral artery. Hypoplastic right vertebral artery otherwise patent within the neck without stenosis or other abnormality. Skeleton: No visible acute osseous abnormality, although cervical spine better evaluated on concomitant CT of the cervical spine. No discrete or worrisome osseous lesions. Other neck: No other acute soft tissue abnormality within the neck. No mass or adenopathy. Upper chest: Scattered atelectatic changes noted within the visualized lungs. Few scattered superimposed tree-in-bud nodular densities within the peripheral right upper lobe could reflect changes of acute bronchiolitis and/or mucoid impaction. No frank airspace consolidation. Review of the MIP images confirms the above findings CTA HEAD FINDINGS Anterior circulation: Petrous, cavernous, and supraclinoid segments of both internal carotid arteries are widely patent without stenosis. A1 segments patent bilaterally. Normal anterior communicating artery. 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 Finding presumably chronic in nature given the chronic bilateral ACA territory infarcts seen on prior noncontrast head CT. Scant distal reconstitution, likely collateral. M1 segments patent bilaterally. Normal MCA bifurcations. Distal MCA branches well perfused and symmetric. Posterior circulation: Dominant left vertebral artery widely patent to the vertebrobasilar junction. Left PICA patent. Hypoplastic right vertebral artery grossly  patent to the vertebrobasilar junction as well. Right PICA not visualized. Basilar patent to its distal aspect without stenosis. Superior cerebellar arteries patent bilaterally. Fetal type origin of the left PCA. Right PCA supplied via the basilar as well as a robust right posterior communicating artery. Scattered atheromatous irregularity within both PCAs without high-grade stenosis. Short-segment mild right P2 stenosis noted (series 10, image 19). Venous sinuses: Not well assessed due to timing of the contrast bolus. Anatomic variants: Fetal type origin of the left PCA. Hypoplastic right vertebral artery. 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 Review of the MIP images confirms the above findings CT Brain Perfusion Findings: ASPECTS: 10 CBF (<30%) Volume: 0mL Perfusion (Tmax>6.0s) volume: 18mL Mismatch Volume: 18mL Infarction Location:Negative CT perfusion for acute ischemia. Delayed perfusion seen within the right ACA distribution, in keeping with the chronic ACA occlusion. IMPRESSION: CTA HEAD AND NECK IMPRESSION: 1. Negative CTA for emergent large vessel occlusion. 2. 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 3. Scattered mild for age atheromatous change elsewhere  about the major arterial vasculature of the head and neck. No other hemodynamically significant or correctable stenosis. 4. Few scattered tree-in-bud nodular densities within right upper lobe, nonspecific, but could reflect changes of acute bronchiolitis and/or mucoid impaction. No frank airspace consolidation. CT PERFUSION IMPRESSION: 1. Negative CT perfusion for acute ischemia. 2. 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 Critical Value/emergent results were called by telephone at the time of interpretation on 11/09/2020 at 9:45 pm to provider [facility] , who verbally acknowledged these results.  Electronically Signed   By: Rise Mu M.D.   On: 11/09/2020 22:11   MR BRAIN WO CONTRAST  Result Date: 11/10/2020 CLINICAL DATA:  Stroke follow-up. EXAM: MRI HEAD WITHOUT CONTRAST TECHNIQUE: Multiplanar, multiecho pulse sequences of the brain and surrounding structures were obtained without intravenous contrast. COMPARISON:  Head CT November 09, 2020. FINDINGS: Incomplete study due to patient inability to lie still in the scanner for the duration the study. Chronic bilateral ACA territory infarct. T2 hyperintensity within the periventricular white matter, nonspecific, most likely related to chronic small vessel ischemia. Brain: No acute infarction, hemorrhage, hydrocephalus, extra-axial collection or mass lesion. Vascular: Normal flow voids at the skull base. Sinuses/Orbits: Opacification of the bilateral frontal sinuses. The orbits are grossly unremarkable. IMPRESSION: 1. Incomplete study . 2. No acute intracranial abnormality identified. 3. Bilateral frontal sinus disease. Electronically Signed   By: Baldemar Lenis M.D.   On: 11/10/2020 18:00   US RENAL  Result Date: 11/10/2020 CLINICAL DATA:  Rule out hydronephrosis EXAM: RENAL / URINARY TRACT ULTRASOUND COMPLETE COMPARISON:  None. FINDINGS: Right Kidney: Renal measurements: 8.7 x 5.1 x 4.7 cm = volume: 109 mL. Echogenicity within normal limits. No mass or hydronephrosis visualized. Left Kidney: Renal measurements: 10.0 x 4.7 x 4.6 cm = volume: 112 mL. Echogenicity within normal limits. No mass or hydronephrosis visualized. Bladder: Decompressed with Foley catheter in place. Other: None. IMPRESSION: No acute findings.  No hydronephrosis. Electronically Signed   By: Charlett Nose M.D.   On: 11/10/2020 21:40   CT C-SPINE NO CHARGE  Result Date: 11/09/2020 CLINICAL DATA:  Left-sided weakness EXAM: CT CERVICAL SPINE WITHOUT CONTRAST TECHNIQUE: Multidetector CT imaging of the cervical spine was performed without intravenous contrast.  Multiplanar CT image reconstructions were also generated. COMPARISON:  None. FINDINGS: Alignment: Alignment is anatomic. Skull base and vertebrae: No acute fracture. No primary bone lesion or focal pathologic process. Soft tissues and spinal canal: Please refer to separately reported CT angiography of the neck for description of vascular findings. No prevertebral fluid or swelling. No visible canal hematoma. Disc levels: Bridging anterior osteophytes are seen throughout the entire cervical spine. Mild disc osteophyte complex at C4-5. No significant central canal or neural foraminal encroachment. Upper chest: Airway is patent. Lung apices are clear. Other: Reconstructed images confirm the above findings. IMPRESSION: 1. No acute cervical spine fracture. 2. Diffuse cervical spondylosis. No significant neural foraminal or central canal stenosis. Electronically Signed   By: Sharlet Salina M.D.   On: 11/09/2020 22:02   CT Code Stroke Cerebral Perfusion with contrast  Result Date: 11/09/2020 CLINICAL DATA:  Initial evaluation for acute stroke, left-sided weakness. EXAM: CT ANGIOGRAPHY HEAD AND NECK CT PERFUSION BRAIN TECHNIQUE: Multidetector CT imaging of the head and neck was performed using the standard protocol during bolus administration of intravenous contrast. Multiplanar CT image reconstructions and MIPs were obtained to evaluate the vascular anatomy. Carotid stenosis measurements (when  applicable) are obtained utilizing NASCET criteria, using the distal internal carotid diameter as the denominator. Multiphase CT imaging of the brain was performed following IV bolus contrast injection. Subsequent parametric perfusion maps were calculated using RAPID software. CONTRAST:  100 cc of Omnipaque 350. COMPARISON:  Prior head CT from earlier the same day. FINDINGS: CTA NECK FINDINGS Aortic arch: Visualized aortic arch of normal caliber with normal branch pattern. No hemodynamically significant stenosis about the origin  of the great vessels. Visualized subclavian arteries widely patent. Right carotid system: Right common and internal carotid arteries widely patent without stenosis, dissection or occlusion. Left carotid system: Left CCA patent from its origin to the bifurcation without stenosis. Mild eccentric soft plaque at the origin of the left ICA without significant stenosis. Left ICA patent distally without stenosis, dissection or occlusion. Vertebral arteries: Both vertebral arteries arise from the subclavian arteries. Dominant left vertebral artery with a diffusely hypoplastic right vertebral artery. Left vertebral artery widely patent within the neck without stenosis or other abnormality. Severe stenosis at the origin of the right vertebral artery. Hypoplastic right vertebral artery otherwise patent within the neck without stenosis or other abnormality. Skeleton: No visible acute osseous abnormality, although cervical spine better evaluated on concomitant CT of the cervical spine. No discrete or worrisome osseous lesions. Other neck: No other acute soft tissue abnormality within the neck. No mass or adenopathy. Upper chest: Scattered atelectatic changes noted within the visualized lungs. Few scattered superimposed tree-in-bud nodular densities within the peripheral right upper lobe could reflect changes of acute bronchiolitis and/or mucoid impaction. No frank airspace consolidation. Review of the MIP images confirms the above findings CTA HEAD FINDINGS Anterior circulation: Petrous, cavernous, and supraclinoid segments of both internal carotid arteries are widely patent without stenosis. A1 segments patent bilaterally. Normal anterior communicating artery. 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 Finding presumably chronic in nature given the chronic bilateral ACA territory infarcts seen on prior noncontrast head CT. Scant distal reconstitution, likely collateral. M1 segments patent bilaterally. Normal MCA  bifurcations. Distal MCA branches well perfused and symmetric. Posterior circulation: Dominant left vertebral artery widely patent to the vertebrobasilar junction. Left PICA patent. Hypoplastic right vertebral artery grossly patent to the vertebrobasilar junction as well. Right PICA not visualized. Basilar patent to its distal aspect without stenosis. Superior cerebellar arteries patent bilaterally. Fetal type origin of the left PCA. Right PCA supplied via the basilar as well as a robust right posterior communicating artery. Scattered atheromatous irregularity within both PCAs without high-grade stenosis. Short-segment mild right P2 stenosis noted (series 10, image 19). Venous sinuses: Not well assessed due to timing of the contrast bolus. Anatomic variants: Fetal type origin of the left PCA. Hypoplastic right vertebral artery. 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 Review of the MIP images confirms the above findings CT Brain Perfusion Findings: ASPECTS: 10 CBF (<30%) Volume: 7mL Perfusion (Tmax>6.0s) volume: 10mL Mismatch Volume: 80mL Infarction Location:Negative CT perfusion for acute ischemia. Delayed perfusion seen within the right ACA distribution, in keeping with the chronic ACA occlusion. IMPRESSION: CTA HEAD AND NECK IMPRESSION: 1. Negative CTA for emergent large vessel occlusion. 2. 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 3. Scattered mild for age atheromatous change elsewhere about the major arterial vasculature of the head and neck. No other hemodynamically significant or correctable stenosis. 4. Few scattered tree-in-bud nodular densities within right upper lobe, nonspecific, but could reflect changes of acute bronchiolitis and/or mucoid impaction. No frank airspace consolidation. CT PERFUSION IMPRESSION: 1.  Negative CT perfusion for acute ischemia. 2. Delayed perfusion within the right ACA distribution, in keeping with the chronic azygos ACA  occlusion and chronic ACA territory infarcts. Critical Value/emergent results were called by telephone at the time of interpretation on 11/09/2020 at 9:45 pm to provider [facility] , who verbally acknowledged these results. Electronically Signed   By: Rise Mu M.D.   On: 11/09/2020 22:11   DG CHEST PORT 1 VIEW  Result Date: 11/11/2020 CLINICAL DATA:  PICC line placement EXAM: PORTABLE CHEST 1 VIEW COMPARISON:  11/09/2020 FINDINGS: Left upper extremity PICC line is been placed with its tip within the superior vena cava. Minimal left basilar atelectasis. Lungs are otherwise clear. No pneumothorax or pleural effusion. Cardiac size within normal limits. Pulmonary vascularity is normal. No acute bone abnormality. IMPRESSION: Left upper extremity PICC line tip within the superior vena cava. Electronically Signed   By: Helyn Numbers MD   On: 11/11/2020 13:12   DG Chest Port 1 View  Result Date: 11/09/2020 CLINICAL DATA:  Fall EXAM: PORTABLE CHEST 1 VIEW COMPARISON:  None. FINDINGS: The heart size and mediastinal contours are within normal limits. Both lungs are clear. The visualized skeletal structures are unremarkable. IMPRESSION: No active disease. Electronically Signed   By: Jonna Clark M.D.   On: 11/09/2020 22:38   DG Swallowing Func-Speech Pathology  Result Date: 11/11/2020 Objective Swallowing Evaluation: Type of Study: MBS-Modified Barium Swallow Study  Patient Details Name: Jeffery Christian MRN: [id number] Date of Birth: August 21, 1951 Today's Date: 11/11/2020 Time: SLP Start Time (ACUTE ONLY): 1437 -SLP Stop Time (ACUTE ONLY): 1455 SLP Time Calculation (min) (ACUTE ONLY): 18 min Past Medical History: Past Medical History: Diagnosis Date . Chronic systolic CHF (congestive heart failure) (HCC)   EF 35-40, diffuse HK, mild MR, moderate LAE, mild RAE, PASP 44, L pleural eff . Dilated cardiomyopathy (HCC)   likely related to tachycardia . Persistent atrial fibrillation [facility])  Past Surgical History: Past  Surgical History: Procedure Laterality Date . CARDIOVERSION N/A 08/20/2017  Procedure: CARDIOVERSION;  Surgeon: [name], MD;  Location: [facility] ENDOSCOPY;  Service: Cardiovascular;  Laterality: N/A; . INCISION AND DRAINAGE ABSCESS Left 03/22/2014  Procedure: INCISION AND DRAINAGE ABSCESS LEFT BUTTOCK ABSCESS;  Surgeon: [name], MD;  Location: [facility] OR;  Service: General;  Laterality: Left; HPI: Pt is a 70 y.o. male with medical history significant for atrial fibrillation on Eliquis-last dose unknown, dilated cardiomyopathy, chronic systolic CHF, CKD 3 who presented after being found on the floor at home by his friends who had last seen him one week prior. EMT noticed increased weakness on the left and he was brought in as a code stroke; this was ultimately cancelled by neurology. MRI was negative for acute changes. Pt found to have COVID-19. CXR 2/8 was negative.  No data recorded Assessment / Plan / Recommendation CHL IP CLINICAL IMPRESSIONS 11/11/2020 Clinical Impression Cervical osteophytes were noted, but did not significantly impact swallow function. Pt demonstrated decreased bolus cohesion, a pharyngeal delay, and incomplete epiglottic inverversion with the initial bolus of thin liquids via cup which was administered by the SLP. Premature spillage was noted to the pyriform sinuses and aspiration (PAS 7) noted thereafter secondary to the pharyngeal delay and incomplete epiglottic inversion. Coughing was noted and effective in expelling some of the aspirated material. Subsequent swallows were significant for prolonged mastication, and mildly reduced lingual retraction, but his swallow was notably more timely with self-feeding and no subsequent instances of penetration or aspiration were noted even when  challenged with large sips and consecutive swallows. Mild to moderate vallecular residue was demonstrated with solids and was reduced to a functional level with a liquid wash. A dysphagia 3 diet with thin  liquids is recommended at this time. Pt's swallow function and associated aspiration risk are much improved with self-feeding and it is recommended that this be encouraged with supervision. SLP will follow to ensure diet tolerance. SLP Visit Diagnosis Dysphagia, unspecified (R13.10) Attention and concentration deficit following -- Frontal lobe and executive function deficit following -- Impact on safety and function Moderate aspiration risk;Mild aspiration risk   CHL IP TREATMENT RECOMMENDATION 11/11/2020 Treatment Recommendations Therapy as outlined in treatment plan below   Prognosis 11/11/2020 Prognosis for Safe Diet Advancement Good Barriers to Reach Goals Cognitive deficits Barriers/Prognosis Comment -- CHL IP DIET RECOMMENDATION 11/11/2020 SLP Diet Recommendations Dysphagia 3 (Mech soft) solids;Thin liquid Liquid Administration via Cup;Straw Medication Administration Whole meds with liquid Compensations Slow rate;Small sips/bites Postural Changes Remain semi-upright after after feeds/meals (Comment);Seated upright at 90 degrees   CHL IP OTHER RECOMMENDATIONS 11/11/2020 Recommended Consults -- Oral Care Recommendations Oral care BID Other Recommendations --   CHL IP FOLLOW UP RECOMMENDATIONS 11/11/2020 Follow up Recommendations (No Data)   CHL IP FREQUENCY AND DURATION 11/11/2020 Speech Therapy Frequency (ACUTE ONLY) min 2x/week Treatment Duration 2 weeks      CHL IP ORAL PHASE 11/11/2020 Oral Phase Impaired Oral - Pudding Teaspoon -- Oral - Pudding Cup -- Oral - Honey Teaspoon -- Oral - Honey Cup -- Oral - Nectar Teaspoon -- Oral - Nectar Cup WFL Oral - Nectar Straw WFL Oral - Thin Teaspoon -- Oral - Thin Cup Decreased bolus cohesion;Premature spillage Oral - Thin Straw WFL Oral - Puree WFL Oral - Mech Soft WFL Oral - Regular WFL Oral - Multi-Consistency -- Oral - Pill WFL Oral Phase - Comment --  CHL IP PHARYNGEAL PHASE 11/11/2020 Pharyngeal Phase Impaired Pharyngeal- Pudding Teaspoon -- Pharyngeal -- Pharyngeal-  Pudding Cup -- Pharyngeal -- Pharyngeal- Honey Teaspoon -- Pharyngeal -- Pharyngeal- Honey Cup -- Pharyngeal -- Pharyngeal- Nectar Teaspoon -- Pharyngeal -- Pharyngeal- Nectar Cup Reduced tongue base retraction Pharyngeal -- Pharyngeal- Nectar Straw WFL Pharyngeal -- Pharyngeal- Thin Teaspoon -- Pharyngeal -- Pharyngeal- Thin Cup Reduced tongue base retraction;Delayed swallow initiation-pyriform sinuses;Pharyngeal residue - valleculae;Pharyngeal residue - pyriform;Penetration/Aspiration during swallow;Penetration/Apiration after swallow;Moderate aspiration Pharyngeal Material enters airway, passes BELOW cords and not ejected out despite cough attempt by patient Pharyngeal- Thin Straw -- Pharyngeal -- Pharyngeal- Puree -- Pharyngeal -- Pharyngeal- Mechanical Soft -- Pharyngeal -- Pharyngeal- Regular -- Pharyngeal -- Pharyngeal- Multi-consistency -- Pharyngeal -- Pharyngeal- Pill -- Pharyngeal -- Pharyngeal Comment --  No flowsheet data found. Scheryl Marten 11/11/2020, 3:52 PM              DG HIP UNILAT WITH PELVIS 2-3 VIEWS RIGHT  Result Date: 11/10/2020 CLINICAL DATA:  Found down, right hip tenderness EXAM: DG HIP (WITH OR WITHOUT PELVIS) 2-3V RIGHT COMPARISON:  None. FINDINGS: Frontal view of the pelvis including both hips as well as frontal and frogleg lateral views of the right hip are obtained. No fracture, subluxation, or dislocation. Joint spaces are well preserved. Soft tissues are normal. IMPRESSION: 1. No acute displaced fracture. Electronically Signed   By: Sharlet Salina M.D.   On: 11/10/2020 19:09   CT HEAD CODE STROKE WO CONTRAST  Result Date: 11/09/2020 CLINICAL DATA:  Code stroke. Initial evaluation for acute left-sided weakness, stroke suspected. EXAM: CT HEAD WITHOUT CONTRAST TECHNIQUE: Contiguous axial images were obtained from  the base of the skull through the vertex without intravenous contrast. COMPARISON:  None available. FINDINGS: Brain: Age-related cerebral atrophy with chronic  small vessel ischemic disease. Remote lacunar infarct present at the left caudate. Remote bilateral ACA territory infarcts noted. No acute intracranial hemorrhage. No acute large vessel territory infarct. No mass lesion, mass effect or midline shift. Mild ventricular prominence related to global parenchymal volume loss without hydrocephalus. No extra-axial fluid collection. Vascular: No hyperdense vessel. Skull: Scalp soft tissues demonstrate no acute finding. Small lipoma noted at the posterior scalp. Calvarium intact. Sinuses/Orbits: Globes and orbital soft tissues within normal limits. Chronic frontal sinusitis noted. Scattered mucosal thickening noted elsewhere within the sphenoid ethmoidal sinuses. No mastoid effusion. Other: None. ASPECTS [facility] Stroke Program Early CT Score) - Ganglionic level infarction (caudate, lentiform nuclei, internal capsule, insula, M1-M3 cortex): 7 - Supraganglionic infarction (M4-M6 cortex): 3 Total score (0-10 with 10 being normal): 10 IMPRESSION: 1. No acute intracranial infarct or other abnormality. 2. ASPECTS is 10. 3. Remote bilateral ACA territory infarcts, with additional remote lacunar infarct at the left basal ganglia. 4. Underlying atrophy with chronic small vessel ischemic disease. These results were communicated to Dr. Wilford Corner at 9:31 pmon 2/8/2022by text page via the [facility] messaging system. Electronically Signed   By: Rise Mu M.D.   On: 11/09/2020 21:51   ECHOCARDIOGRAM LIMITED  Result Date: 11/10/2020    ECHOCARDIOGRAM LIMITED REPORT   Patient Name:   Jeffery Christian  Date of Exam: 11/10/2020 Medical Rec #:  [id number]  Height:       72.0 in Accession #:    [id number] Weight:       217.6 lb Date of Birth:  Feb 22, 1951 BSA:          2.208 m Patient Age:    69 years   BP:           138/94 mmHg Patient Gender: M          HR:           138 bpm. Exam Location:  Inpatient Procedure: Limited Echo, Cardiac Doppler and Color Doppler Indications:    Atrial fibrillation   History:        Patient has prior history of Echocardiogram examinations, most                 recent 07/19/2017. CHF; Arrythmias:Atrial Fibrillation. Dilated                 cardiomyopathy. CKD.  Sonographer:    Ross Ludwig RDCS (AE) Referring Phys: [id number] [facility]  Sonographer Comments: Suboptimal parasternal window. IMPRESSIONS  1. Left ventricular ejection fraction, by estimation, is 60 to 65%. The left ventricle has normal function. The left ventricle has no regional wall motion abnormalities. There is moderate left ventricular hypertrophy. Left ventricular diastolic parameters are indeterminate.  2. Right ventricular systolic function is normal. The right ventricular size is normal.  3. Left atrial size was mildly dilated.  4. The mitral valve is normal in structure. Trivial mitral valve regurgitation. No evidence of mitral stenosis.  5. The aortic valve was not well visualized. Aortic valve regurgitation is not visualized. No aortic stenosis is present.  6. The inferior vena cava is dilated in size with >50% respiratory variability, suggesting right atrial pressure of 8 mmHg. FINDINGS  Left Ventricle: Left ventricular ejection fraction, by estimation, is 60 to 65%. The left ventricle has normal function. The left ventricle has no regional wall motion abnormalities. The left ventricular internal cavity  size was normal in size. There is  moderate left ventricular hypertrophy. Left ventricular diastolic parameters are indeterminate. Right Ventricle: The right ventricular size is normal. No increase in right ventricular wall thickness. Right ventricular systolic function is normal. Left Atrium: Left atrial size was mildly dilated. Right Atrium: Right atrial size was normal in size. Pericardium: There is no evidence of pericardial effusion. Mitral Valve: The mitral valve is normal in structure. Trivial mitral valve regurgitation. No evidence of mitral valve stenosis. Tricuspid Valve: The tricuspid valve  is normal in structure. Tricuspid valve regurgitation is mild . No evidence of tricuspid stenosis. Aortic Valve: The aortic valve was not well visualized. Aortic valve regurgitation is not visualized. No aortic stenosis is present. Pulmonic Valve: The pulmonic valve was normal in structure. Pulmonic valve regurgitation is not visualized. No evidence of pulmonic stenosis. Aorta: The aortic root is normal in size and structure. Venous: The inferior vena cava is dilated in size with greater than 50% respiratory variability, suggesting right atrial pressure of 8 mmHg. IAS/Shunts: No atrial level shunt detected by color flow Doppler. LEFT VENTRICLE PLAX 2D LVIDd:         4.80 cm LVIDs:         3.40 cm LV PW:         1.60 cm LV IVS:        1.50 cm LVOT diam:     2.10 cm LVOT Area:     3.46 cm  IVC IVC diam: 1.70 cm LEFT ATRIUM         Index LA diam:    3.90 cm 1.77 cm/m   AORTA Ao Root diam: 3.60 cm  SHUNTS Systemic Diam: 2.10 cm Charlton Haws MD Electronically signed by Charlton Haws MD Signature Date/Time: 11/10/2020/3:42:53 PM    Final    [place] EKG SITE RITE  Result Date: 11/11/2020 If Site Rite image not attached, placement could not be confirmed due to current cardiac rhythm.   Assessment/Plan  1. Wound infection -   Will start doxycycline 100 mg 1 tab twice a day x2 weeks and Florastor 250 mg 1 capsule twice a day x17 days -    Continue wound treatment with Santyl daily  2. Urinary retention -   Continue Foley catheter and Flomax     Family/ staff Communication: Discussed plan of care with charge nurse.  Labs/tests ordered: None  Goals of care:   Short-term care   Kenard Gower, DNP, MSN, FNP-BC [facility] [phone] (Monday-Friday 8:00 a.m. - 5:00 p.m.) [phone] (after hours)

## 2020-12-06 DIAGNOSIS — L89310 Pressure ulcer of right buttock, unstageable: Secondary | ICD-10-CM | POA: Diagnosis not present

## 2020-12-07 ENCOUNTER — Ambulatory Visit: Payer: Medicare Other | Admitting: Family Medicine

## 2020-12-09 ENCOUNTER — Encounter: Payer: Self-pay | Admitting: Adult Health

## 2020-12-09 ENCOUNTER — Other Ambulatory Visit: Payer: Self-pay | Admitting: *Deleted

## 2020-12-09 ENCOUNTER — Non-Acute Institutional Stay (SKILLED_NURSING_FACILITY): Payer: Medicare Other | Admitting: Adult Health

## 2020-12-09 DIAGNOSIS — T148XXA Other injury of unspecified body region, initial encounter: Secondary | ICD-10-CM | POA: Diagnosis not present

## 2020-12-09 DIAGNOSIS — L089 Local infection of the skin and subcutaneous tissue, unspecified: Secondary | ICD-10-CM | POA: Diagnosis not present

## 2020-12-09 DIAGNOSIS — Z1211 Encounter for screening for malignant neoplasm of colon: Secondary | ICD-10-CM

## 2020-12-09 DIAGNOSIS — R339 Retention of urine, unspecified: Secondary | ICD-10-CM | POA: Diagnosis not present

## 2020-12-09 DIAGNOSIS — Z23 Encounter for immunization: Secondary | ICD-10-CM

## 2020-12-09 DIAGNOSIS — I4891 Unspecified atrial fibrillation: Secondary | ICD-10-CM | POA: Diagnosis not present

## 2020-12-09 NOTE — Progress Notes (Signed)
Location:  [facility] Room Number: 224A Place of Service:  SNF (31) Provider:  Kenard Gower, DNP, FNP-BC  Patient Care Team: Hoy Register, MD as PCP - General (Family Medicine)  Extended Emergency Contact Information Primary Emergency Contact: Peterson Lombard  [place] Home Phone: [phone] Relation: Daughter Secondary Emergency Contact: SAPP,RON Home Phone: [phone] Relation: Other  Code Status:  Full Code  Goals of care: Advanced Directive information Advanced Directives 12/09/2020  Does Patient Have a Medical Advance Directive? No  Would patient like information on creating a medical advance directive? -  Pre-existing out of facility DNR order (yellow form or pink MOST form) -     Chief Complaint  Patient presents with  . Acute Visit    Short term rehab visit. Discuss need for Tetanus/Tdap and stool occult blood X 3    HPI:   Pt is a 70 y.o. male seen today for short-term rehab visit.  He is a short-term care resident of [facility]. He has a PMH of atrial fibrillation Eliquis, dilated cardiomyopathy, chronic systolic CHF and chronic kidney disease is stage III. He is currently on Doxycycline 100 mg BID X 14 days for sacral wound infection, started 11/29/20. Wound is treated daily with santyl. HR is 100.  He takes metoprolol tartrate 25 mg twice a day for A. fib with RVR. He is currently having PT and OT.    Past Medical History:  Diagnosis Date  . Chronic systolic CHF (congestive heart failure) (HCC)    EF 35-40, diffuse HK, mild MR, moderate LAE, mild RAE, PASP 44, L pleural eff  . Dilated cardiomyopathy (HCC)    likely related to tachycardia  . Persistent atrial fibrillation [facility])    Past Surgical History:  Procedure Laterality Date  . CARDIOVERSION N/A 08/20/2017   Procedure: CARDIOVERSION;  Surgeon: [name], MD;  Location: [facility] ENDOSCOPY;  Service: Cardiovascular;  Laterality:  N/A;  . INCISION AND DRAINAGE ABSCESS Left 03/22/2014   Procedure: INCISION AND DRAINAGE ABSCESS LEFT BUTTOCK ABSCESS;  Surgeon: [name], MD;  Location: [facility] OR;  Service: General;  Laterality: Left;    No Known Allergies  Outpatient Encounter Medications as of 12/09/2020  Medication Sig  . acetaminophen (TYLENOL) 325 MG tablet Take 650 mg by mouth every 6 (six) hours as needed.  Marland Kitchen acetaminophen (TYLENOL) 650 MG suppository Place 1 suppository (650 mg total) rectally every 6 (six) hours as needed for fever.  Marland Kitchen allopurinol (ZYLOPRIM) 300 MG tablet Take 1 tablet (300 mg total) by mouth 2 (two) times daily.  . Amino Acids-Protein Hydrolys (PRO-STAT PO) Take 30 mLs by mouth 2 (two) times daily.  Marland Kitchen apixaban (ELIQUIS) 5 MG TABS tablet Take 1 tablet (5 mg total) by mouth 2 (two) times daily.  Marland Kitchen atorvastatin (LIPITOR) 20 MG tablet Take 1 tablet (20 mg total) by mouth daily.  . bisacodyl (DULCOLAX) 10 MG suppository Place 10 mg rectally as needed for moderate constipation.  . colchicine 0.6 MG tablet Take 1.2 mg by mouth daily. At the onset of a GOUT Flare as needed  . collagenase (SANTYL) ointment Apply topically daily. Apply to gluteal pressure ulcers daily.  . feeding supplement (ENSURE ENLIVE / ENSURE PLUS) LIQD Take 237 mLs by mouth 2 (two) times daily between meals.  . Magnesium Hydroxide (MILK OF MAGNESIA PO) Take 30 mLs by mouth as needed.  . metoprolol tartrate (LOPRESSOR) 25 MG tablet Take 1 tablet (25 mg total) by mouth 2 (two)  times daily.  . midodrine (PROAMATINE) 2.5 MG tablet Take 1 tablet (2.5 mg total) by mouth 3 (three) times daily with meals.  Marland Kitchen neomycin-bacitracin-polymyxin (NEOSPORIN) OINT Apply 1 application topically 2 (two) times daily. Please apply to glans penis wound BID.  Marland Kitchen pantoprazole (PROTONIX) 40 MG tablet Take 1 tablet (40 mg total) by mouth daily.  Marland Kitchen saccharomyces boulardii (FLORASTOR) 250 MG capsule Take 250 mg by mouth 2 (two) times daily.  . Sodium Phosphates  (RA SALINE ENEMA RE) Place rectally as needed.  . tamsulosin (FLOMAX) 0.4 MG CAPS capsule Take 1 capsule (0.4 mg total) by mouth daily after supper.  . traMADol (ULTRAM) 50 MG tablet Give 1/2 tab = 25 mg by mouth BID  . [DISCONTINUED] colchicine 0.6 MG tablet Take 0.6 mg by mouth. Take 1 capsule 1 hour after taking 2 capsule if GOUT symptoms have not subsided   No facility-administered encounter medications on file as of 12/09/2020.    Review of Systems  Unable to obtain due to neurocognitive deficit.    Immunization History  Administered Date(s) Administered  . Pneumococcal Conjugate-13 10/28/2018  . Pneumococcal Polysaccharide-23 09/08/2020   Pertinent  Health Maintenance Due  Topic Date Due  . COLONOSCOPY (Pts 45-11yrs Insurance coverage will need to be confirmed)  Never done  . INFLUENZA VACCINE  12/30/2020 (Originally 05/02/2020)  . PNA vac Low Risk Adult  Completed   Fall Risk  09/08/2020 06/08/2020 10/28/2018 07/10/2018 04/09/2018  Falls in the past year? 0 0 0 No No  Number falls in past yr: 0 - - - -  Injury with Fall? 0 - - - -  Risk for fall due to : - No Fall Risks - - -     Vitals:   12/09/20 1552  BP: 96/64  Pulse: 100  Temp: 98.6 F (37 C)  Weight: 193 lb (87.5 kg)  Height: 6' (1.829 m)   Body mass index is 26.18 kg/m.  Physical Exam  GENERAL APPEARANCE: Well nourished. In no acute distress. Normal body habitus SKIN:  Right buttock pressure ulcer MOUTH and THROAT: Lips are without lesions. Oral mucosa is moist and without lesions.  RESPIRATORY: Breathing is even & unlabored, BS CTAB CARDIAC: RRR, no murmur,no extra heart sounds, no edema GI: Abdomen soft, normal BS, no masses, no tenderness NEUROLOGICAL: There is no tremor. Speech is clear.  PSYCHIATRIC:  Affect and behavior are appropriate  Labs reviewed: Recent Labs    11/10/20 1508 11/10/20 2000 11/14/20 0310 11/15/20 0306 11/16/20 0310  NA 167*   < > 148* 142 141  K 4.1   < > 3.9 3.7 3.8  CL  >130*   < > 119* 114* 114*  CO2 20*   < > 21* 21* 21*  GLUCOSE 152*   < > 122* 202* 118*  BUN 85*   < > 37* 28* 24*  CREATININE 2.58*   < > 1.41* 1.46* 1.22  CALCIUM 8.5*   < > 8.3* 8.0* 8.0*  MG  --   --  2.2  --   --   PHOS 3.5  --   --   --   --    < > = values in this interval not displayed.   Recent Labs    11/12/20 0345 11/13/20 0304 11/14/20 0310  AST 27 32 38  ALT 26 41 47*  ALKPHOS 56 64 71  BILITOT 0.6 0.7 0.7  PROT 4.6* 4.8* 4.8*  ALBUMIN 2.2* 2.1* 2.0*   Recent Labs  11/12/20 0345 11/13/20 0304 11/14/20 0310 11/16/20 0310  WBC 8.6 6.8 9.4 10.1  NEUTROABS 7.2 5.5 7.5  --   HGB 11.7* 11.9* 12.3* 10.5*  HCT 36.7* 35.9* 37.0* 31.5*  MCV 89.5 86.5 85.1 85.1  PLT 208 191 214 225   Lab Results  Component Value Date   TSH 2.627 11/14/2020   Lab Results  Component Value Date   HGBA1C 5.3 06/08/2020   Lab Results  Component Value Date   CHOL 139 10/28/2018   HDL 39 (L) 10/28/2018   LDLCALC 65 10/28/2018   TRIG 174 (H) 10/28/2018   CHOLHDL 3.6 10/28/2018     Assessment/Plan  1. Urinary retention -   foley catheter was discontinued and he is able to void on his own -   Continue tamsulosin  2. Wound infection -   Continue doxycycline and Florastor -   Wound treatment daily  3. Atrial fibrillation with RVR (HCC) -   Rate controlled, continue Eliquis for anticoagulation and metoprolol tartrate for rate control  4. Need for Tdap vaccination -    Tdap 0.5 mL vaccine IM x1  5. Colon cancer screening -    stool occult blood x3     Family/ staff Communication: Discussed plan of care with charge nurse.  Labs/tests ordered: None  Goals of care:   Short-term care   Kenard Gower, DNP, MSN, FNP-BC [facility] and Adult Medicine [phone] (Monday-Friday 8:00 a.m. - 5:00 p.m.) [phone] (after hours)

## 2020-12-09 NOTE — Patient Outreach (Signed)
Member screened for potential [facility] Care Management needs.  Per Patient Jeffery Christian [facility]) member resides in [facility]. Voicemail left for Nora SW to inquire about member's transition plans and potential [facility] needs.  Will continue to follow and plan outreach to member to discuss [facility] Care Management services.  Raiford Noble, MSN, RN,BSN [facility] Post Acute Care Coordinator [phone] [facility]) [phone]  (Toll free office)

## 2020-12-13 DIAGNOSIS — L89314 Pressure ulcer of right buttock, stage 4: Secondary | ICD-10-CM | POA: Diagnosis not present

## 2020-12-14 ENCOUNTER — Encounter: Payer: Self-pay | Admitting: Internal Medicine

## 2020-12-14 ENCOUNTER — Non-Acute Institutional Stay (SKILLED_NURSING_FACILITY): Payer: Medicare Other | Admitting: Internal Medicine

## 2020-12-14 DIAGNOSIS — L0231 Cutaneous abscess of buttock: Secondary | ICD-10-CM

## 2020-12-14 DIAGNOSIS — R4189 Other symptoms and signs involving cognitive functions and awareness: Secondary | ICD-10-CM | POA: Diagnosis not present

## 2020-12-14 DIAGNOSIS — M1A0720 Idiopathic chronic gout, left ankle and foot, without tophus (tophi): Secondary | ICD-10-CM | POA: Diagnosis not present

## 2020-12-14 DIAGNOSIS — D649 Anemia, unspecified: Secondary | ICD-10-CM | POA: Diagnosis not present

## 2020-12-14 DIAGNOSIS — R29818 Other symptoms and signs involving the nervous system: Secondary | ICD-10-CM

## 2020-12-14 LAB — BASIC METABOLIC PANEL
BUN: 25 — AB (ref 4–21)
CO2: 25 — AB (ref 13–22)
Chloride: 101 (ref 99–108)
Creatinine: 1.2 (ref 0.6–1.3)
Glucose: 158
Potassium: 4.1 (ref 3.4–5.3)
Sodium: 136 — AB (ref 137–147)

## 2020-12-14 LAB — CBC: RBC: 3.14 — AB (ref 3.87–5.11)

## 2020-12-14 LAB — COMPREHENSIVE METABOLIC PANEL
Calcium: 8.5 — AB (ref 8.7–10.7)
GFR calc Af Amer: 73.63
GFR calc non Af Amer: 63.53

## 2020-12-14 LAB — POCT ERYTHROCYTE SEDIMENTATION RATE, NON-AUTOMATED: Sed Rate: 127

## 2020-12-14 LAB — CBC AND DIFFERENTIAL
HCT: 25 — AB (ref 41–53)
Hemoglobin: 8.3 — AB (ref 13.5–17.5)
Platelets: 575 — AB (ref 150–399)
WBC: 8.5

## 2020-12-14 NOTE — Assessment & Plan Note (Addendum)
He describes acute gout in R hand & R foot with mild podagra clinically but sed rate of 127 C-reactive protein and uric acid will be checked. The most common cause of elevated uric acid is the ingestion of sugar from high fructose corn syrup sources. You should avoid foods and drinks with high fructose corn syrup as ingredient number 2, 3, or #4 on the label. This was discussed with him & his niece. Obviously his comprehension is suspect.

## 2020-12-14 NOTE — Assessment & Plan Note (Addendum)
Wound Care Nurse stated this was debrided 3/4 and the wound is smaller but deep.Length is 6.5 cm with 8 cm, and depth 4 cm.  Serous but not purulent drainage was noted. Mobile imaging did not suggest osteomyelitis.  Doxycycline will be continued x1 week until  follow-up labs ( uric acid & CRP) are reviewed.

## 2020-12-14 NOTE — Patient Instructions (Signed)
See assessment and plan under each diagnosis in the problem list and acutely for this visit 

## 2020-12-14 NOTE — Assessment & Plan Note (Signed)
12/14/2020 today he cannot give the correct date.  He could only state that it was Saturday (it is Tuesday).  He could not give the month or the year.  He could not name the president.  He mentioned the war but could not tell me that it was in [place].

## 2020-12-14 NOTE — Progress Notes (Signed)
   NURSING HOME LOCATION:  [facility] ROOM NUMBER: 224  CODE STATUS: Full Code  PCP:  Hoy Register MD  This is a nursing facility follow up visit for specific acute issue of decubitus of the buttocks with associated abnormal labs.  Interim medical record and care since last SNF visit was updated with review of diagnostic studies and change in clinical status since last visit were documented.  HPI: The left buttocks lesion was debrided 3/4; Wound Care Nurse reports it is smaller but extremely deep.  She also stated that he had been on doxycycline from 3/2 until today.  Portable imaging did not suggest osteomyelitis; but his sed rate was 127.  White count was 8500 with normal dif. His hemoglobin/hematocrit have dropped from 10.5/31.5 on 2/15 to 8.3/25.4.  Review of systems: Dementia invalidated responses. Date given as " Saturday"; he could not give the month or the year.  He could also not name the president.  He asked what I "thought of the war" but could not name [place] or [place].  He would become frustrated and curse when he could not come up with an answer. He describes acute gout in the lateral aspect of the right hand as well as the right great toe.  (Note:There are multiple beverages in his room containing high fructose corn syrup, a major cause of hyperuricemia).  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  Breath sounds & heart sounds are distant.  Abdomen is protuberant.  He has 2+ pitting edema; pedal pulses are not palpable.  There is faint erythema over the base of the right great toe which is tender to touch. Lateral aspect of R hand also tender.  General appearance: Adequately nourished; no acute distress, increased work of breathing is present.   Lymphatic: No lymphadenopathy about the head, neck, axilla. Eyes: No conjunctival inflammation or lid edema is present. There is no scleral  icterus. Ears:  External ear exam shows no significant lesions or deformities.   Nose:  External nasal examination shows no deformity or inflammation. Nasal mucosa are pink and moist without lesions, exudates Oral exam:  Lips and gums are healthy appearing. There is no oropharyngeal erythema or exudate. Neck:  No thyromegaly, masses, tenderness noted.    Heart: rhythm & rate basically indiscernable  Lungs:  without wheezes, rhonchi, rales, rubs. Abdomen: Bowel sounds are normal. Abdomen is soft and nontender with no organomegaly, hernias, masses. GU: Deferred  Extremities:  No cyanosis, clubbing  Neurologic exam :Balance, Rhomberg, finger to nose testing could not be completed due to clinical state Skin: Warm & dry w/o tenting. No significant lesions or rash.  See summary under each active problem in the Problem List with associated updated therapeutic plan

## 2020-12-14 NOTE — Assessment & Plan Note (Addendum)
H/H  dropped from 10.5/31.5 on 11/16/2020 to 8.3/25.4 on 3/15. No bleeding dyscrasias reported despite Eliquis for AF.  Check FOB.Ferrous sulfate 325 mg daily initiated with monitoring of CBC.

## 2020-12-15 ENCOUNTER — Encounter: Payer: Self-pay | Admitting: Internal Medicine

## 2020-12-16 ENCOUNTER — Non-Acute Institutional Stay (SKILLED_NURSING_FACILITY): Payer: Medicare Other | Admitting: Adult Health

## 2020-12-16 ENCOUNTER — Other Ambulatory Visit: Payer: Self-pay | Admitting: *Deleted

## 2020-12-16 ENCOUNTER — Encounter: Payer: Self-pay | Admitting: Adult Health

## 2020-12-16 DIAGNOSIS — M1A9XX0 Chronic gout, unspecified, without tophus (tophi): Secondary | ICD-10-CM | POA: Diagnosis not present

## 2020-12-16 NOTE — Patient Outreach (Signed)
[facility] Post-Acute Care Coordinator follow up.   Jeffery Christian resides in [place]. Writer following for transition plans and potential [facility] Care Management needs.   Telephone call made to Jeffery Christian at [phone]. No answer. HIPAA compliant voicemail message left to request return call. Also attempted to reach daughter/DPR Jeffery Christian [phone]. No answer. HIPAA compliant voicemail message left requesting return call as well.   Will plan outreach again to discuss [facility] follow up. Previous unsuccessful attempt to reach facility SW. Will plan outreach again to SNF SW to inquire about member's transition plans.    Raiford Noble, MSN, RN,BSN [facility] Post Acute Care Coordinator [phone] [facility]) [phone]  (Toll free office)

## 2020-12-16 NOTE — Progress Notes (Signed)
Location:  [facility] Room Number: 224-A Place of Service:  SNF (31) Provider:  Kenard Gower, DNP, FNP-BC  Patient Care Team: Hoy Register, MD as PCP - General (Family Medicine)  Extended Emergency Contact Information Primary Emergency Contact: Peterson Lombard  [place] Home Phone: [phone] Relation: Daughter Secondary Emergency Contact: SAPP,RON Home Phone: [phone] Relation: Other  Code Status:    Goals of care: Advanced Directive information Advanced Directives 12/21/2020  Does Patient Have a Medical Advance Directive? No  Would patient like information on creating a medical advance directive? No - Patient declined  Pre-existing out of facility DNR order (yellow form or pink MOST form) -     Chief Complaint  Patient presents with  . Acute Visit    Medication Management    HPI:  Pt is a 70 y.o. male  seen today for an acute visit for medication management. She is a short-term care resident of [facility]. She currently has an order for PRN colchicine which pharmacy wants to clarify. He, also, takes Allopurinol for gout. No gout flare ups. He is currently having PT and OT for rehabilitation.   Past Medical History:  Diagnosis Date  . Chronic systolic CHF (congestive heart failure) (HCC)    EF 35-40, diffuse HK, mild MR, moderate LAE, mild RAE, PASP 44, L pleural eff  . Dilated cardiomyopathy (HCC)    likely related to tachycardia  . Persistent atrial fibrillation [facility])    Past Surgical History:  Procedure Laterality Date  . CARDIOVERSION N/A 08/20/2017   Procedure: CARDIOVERSION;  Surgeon: [name], MD;  Location: [facility] ENDOSCOPY;  Service: Cardiovascular;  Laterality: N/A;  . INCISION AND DRAINAGE ABSCESS Left 03/22/2014   Procedure: INCISION AND DRAINAGE ABSCESS LEFT BUTTOCK ABSCESS;  Surgeon: [name], MD;  Location: [facility] OR;  Service: General;  Laterality: Left;    No Known  Allergies  Outpatient Encounter Medications as of 12/16/2020  Medication Sig  . acetaminophen (TYLENOL) 325 MG tablet Take 650 mg by mouth every 6 (six) hours as needed. (Patient not taking: Reported on 12/22/2020)  . acetaminophen (TYLENOL) 650 MG suppository Place 1 suppository (650 mg total) rectally every 6 (six) hours as needed for fever.  . Amino Acids-Protein Hydrolys (PRO-STAT PO) Take 30 mLs by mouth 2 (two) times daily.  Marland Kitchen apixaban (ELIQUIS) 5 MG TABS tablet Take 1 tablet (5 mg total) by mouth 2 (two) times daily.  Marland Kitchen atorvastatin (LIPITOR) 20 MG tablet Take 1 tablet (20 mg total) by mouth daily.  . bisacodyl (DULCOLAX) 10 MG suppository Place 10 mg rectally as needed for moderate constipation.  . colchicine 0.6 MG tablet Take 1.2 mg by mouth. At the onset of a GOUT Flares and repeat 0.6 in 1 hour if symptoms persist  . feeding supplement (ENSURE ENLIVE / ENSURE PLUS) LIQD Take 237 mLs by mouth 2 (two) times daily between meals.  . ferrous sulfate 325 (65 FE) MG tablet Take 325 mg by mouth daily with breakfast.  . Magnesium Hydroxide (MILK OF MAGNESIA PO) Take 30 mLs by mouth as needed.  . metoprolol tartrate (LOPRESSOR) 25 MG tablet Take 1 tablet (25 mg total) by mouth 2 (two) times daily.  . midodrine (PROAMATINE) 2.5 MG tablet Take 1 tablet (2.5 mg total) by mouth 3 (three) times daily with meals.  Marland Kitchen neomycin-bacitracin-polymyxin (NEOSPORIN) OINT Apply 1 application topically 2 (two) times daily. Please apply to glans penis wound BID.  Marland Kitchen pantoprazole (PROTONIX) 40 MG  tablet Take 1 tablet (40 mg total) by mouth daily.  . Sodium Phosphates (RA SALINE ENEMA RE) Place rectally as needed.  . tamsulosin (FLOMAX) 0.4 MG CAPS capsule Take 1 capsule (0.4 mg total) by mouth daily after supper.  . traMADol (ULTRAM) 50 MG tablet Give 1/2 tab = 25 mg by mouth BID  . [DISCONTINUED] allopurinol (ZYLOPRIM) 300 MG tablet Take 1 tablet (300 mg total) by mouth 2 (two) times daily.  . [DISCONTINUED]  saccharomyces boulardii (FLORASTOR) 250 MG capsule Take 250 mg by mouth 2 (two) times daily.  . [DISCONTINUED] collagenase (SANTYL) ointment Apply topically daily. Apply to gluteal pressure ulcers daily.   No facility-administered encounter medications on file as of 12/16/2020.    Review of Systems  GENERAL: No fever or chills  MOUTH and THROAT: Denies oral discomfort, gingival pain or bleeding RESPIRATORY: no cough, SOB, DOE, wheezing, hemoptysis CARDIAC: No chest pain, edema or palpitations GI: No abdominal pain, diarrhea, constipation, heart burn, nausea or vomiting GU: Denies dysuria, frequency, hematuria, incontinence, or discharge NEUROLOGICAL: Denies dizziness, syncope, numbness, or headache PSYCHIATRIC: Denies feelings of depression or anxiety. No report of hallucinations, insomnia, paranoia, or agitation   Immunization History  Administered Date(s) Administered  . DTaP 12/14/2020  . Pneumococcal Conjugate-13 10/28/2018  . Pneumococcal Polysaccharide-23 09/08/2020   Pertinent  Health Maintenance Due  Topic Date Due  . COLONOSCOPY (Pts 45-46yrs Insurance coverage will need to be confirmed)  Never done  . INFLUENZA VACCINE  12/30/2020 (Originally 05/02/2020)  . PNA vac Low Risk Adult  Completed   Fall Risk  09/08/2020 06/08/2020 10/28/2018 07/10/2018 04/09/2018  Falls in the past year? 0 0 0 No No  Number falls in past yr: 0 - - - -  Injury with Fall? 0 - - - -  Risk for fall due to : - No Fall Risks - - -     Vitals:   12/16/20 1426  BP: 96/64  Pulse: 100  Resp: 20  Temp: 98.6 F (37 C)  SpO2: 97%  Weight: 189 lb (85.7 kg)  Height: 6' (1.829 m)   Body mass index is 25.63 kg/m.  Physical Exam  GENERAL APPEARANCE: Well nourished. In no acute distress. Normal body habitus SKIN:  Right buttock pressure ulcer stage IV with dressing MOUTH and THROAT: Lips are without lesions. Oral mucosa is moist and without lesions.  RESPIRATORY: Breathing is even & unlabored, BS  CTAB CARDIAC: RRR, no murmur,no extra heart sounds, no edema GI: Abdomen soft, normal BS, no masses, no tenderness NEUROLOGICAL: There is no tremor. Speech is clear.  Alert to self, disoriented to time and place. PSYCHIATRIC:  Affect and behavior are appropriate  Labs reviewed: Recent Labs    11/10/20 1508 11/10/20 2000 11/14/20 0310 11/15/20 0306 11/16/20 0310 11/22/20 0000 12/14/20 0000  NA 167*   < > 148* 142 141 140 136*  K 4.1   < > 3.9 3.7 3.8 4.7 4.1  CL >130*   < > 119* 114* 114* 103 101  CO2 20*   < > 21* 21* 21* 25* 25*  GLUCOSE 152*   < > 122* 202* 118*  --   --   BUN 85*   < > 37* 28* 24* 14 25*  CREATININE 2.58*   < > 1.41* 1.46* 1.22 1.1 1.2  CALCIUM 8.5*   < > 8.3* 8.0* 8.0* 8.2* 8.5*  MG  --   --  2.2  --   --   --   --  PHOS 3.5  --   --   --   --   --   --    < > = values in this interval not displayed.   Recent Labs    11/12/20 0345 11/13/20 0304 11/14/20 0310  AST 27 32 38  ALT 26 41 47*  ALKPHOS 56 64 71  BILITOT 0.6 0.7 0.7  PROT 4.6* 4.8* 4.8*  ALBUMIN 2.2* 2.1* 2.0*   Recent Labs    11/12/20 0345 11/13/20 0304 11/14/20 0310 11/16/20 0310 12/14/20 0000  WBC 8.6 6.8 9.4 10.1 8.5  NEUTROABS 7.2 5.5 7.5  --   --   HGB 11.7* 11.9* 12.3* 10.5* 8.3*  HCT 36.7* 35.9* 37.0* 31.5* 25*  MCV 89.5 86.5 85.1 85.1  --   PLT 208 191 214 225 575*   Lab Results  Component Value Date   TSH 2.627 11/14/2020   Lab Results  Component Value Date   HGBA1C 5.3 06/08/2020   Lab Results  Component Value Date   CHOL 139 10/28/2018   HDL 39 (L) 10/28/2018   LDLCALC 65 10/28/2018   TRIG 174 (H) 10/28/2018   CHOLHDL 3.6 10/28/2018     Assessment/Plan  1. Chronic gout without tophus, unspecified cause, unspecified site -  Will continue allopurinol -   Will give colchicine 0.6 mg 1 capsule give 2 capsules1.2 mg orally at first sign of gout flareup and repeat 0.6 mg in 1 hour if symptoms    Family/ staff Communication: Discussed plan of care with  resident and charge nurse.  Labs/tests ordered: None  Goals of care:   Short-term care   Kenard Gower, DNP, MSN, FNP-BC [facility] and Adult Medicine [phone] (Monday-Friday 8:00 a.m. - 5:00 p.m.) [phone] (after hours)

## 2020-12-20 DIAGNOSIS — L89314 Pressure ulcer of right buttock, stage 4: Secondary | ICD-10-CM | POA: Diagnosis not present

## 2020-12-21 ENCOUNTER — Other Ambulatory Visit: Payer: Self-pay | Admitting: *Deleted

## 2020-12-21 ENCOUNTER — Encounter: Payer: Self-pay | Admitting: Internal Medicine

## 2020-12-21 ENCOUNTER — Non-Acute Institutional Stay (SKILLED_NURSING_FACILITY): Payer: Medicare Other | Admitting: Internal Medicine

## 2020-12-21 DIAGNOSIS — M1A09X0 Idiopathic chronic gout, multiple sites, without tophus (tophi): Secondary | ICD-10-CM

## 2020-12-21 DIAGNOSIS — L0231 Cutaneous abscess of buttock: Secondary | ICD-10-CM

## 2020-12-21 DIAGNOSIS — I5022 Chronic systolic (congestive) heart failure: Secondary | ICD-10-CM | POA: Diagnosis not present

## 2020-12-21 NOTE — Progress Notes (Signed)
   NURSING HOME LOCATION:  [facility] ROOM NUMBER:  224 CODE STATUS:  Full Code PCP:  Hoy Register MD  This is a nursing facility follow up visit for specific acute issue of abnormal labs, specifically extremely elevated inflammatory markers, sed rate & CRP.Marland Kitchen Interim medical record and care since last SNF visit was updated with review of diagnostic studies and change in clinical status since last visit were documented.  HPI: He was seen acutely 3/15 because of extremely high anti-inflammatory marker manifested as a sed rate of 127.  White count was normal with a normal differential.  Wound Care Nurse stated that the left buttocks wound was deep but there was no evidence of active infection. Today she states it still has only serous drainage w/o cellulitis, necrosis or purulence  He was complaining of tenderness at the right wrist and the right ankle and it was suspect that he had acute gout as the etiology of the elevated sed rate.  Subsequent to that visit his uric acid level came back 3.1 which is extremely low and the C-reactive protein returned significantly elevated at 12.45.  Concern was the possibility of osteomyelitis. Problematic is the fact that he can provide no meaningful history.  He cannot even give me the year.  He named the president as that "white haired man". He denies any constitutional symptoms but continues to complain that the right ankle and right wrist are tender.   He also mentioned he has sore throat.  There were no other constitutional, GI, hematologic, or infectious symptoms.  Review of systems: Dementia invalidated responses as noted.  Constitutional: No fever, significant weight change, fatigue  Eyes: No redness, discharge, pain, vision change ENT/mouth: No nasal congestion,  purulent discharge, earache Cardiovascular: No chest pain, palpitations, paroxysmal nocturnal dyspnea, claudication, edema  Respiratory: No cough, sputum production,  hemoptysis, DOE, significant snoring, apnea   Gastrointestinal: No heartburn, dysphagia, abdominal pain, nausea /vomiting, rectal bleeding, melena, change in bowels Genitourinary: No dysuria, hematuria, pyuria, incontinence, nocturia Dermatologic: No rash, pruritus, change in appearance of skin Neurologic: No dizziness, headache, syncope, seizures, numbness, tingling Endocrine: No change in hair/skin/nails, excessive thirst, excessive hunger, excessive urination   Physical exam:  Pertinent or positive findings: Facies tend to be blank.  Dentition is good and there are no oropharyngeal finding of exudate or inflammation.  He has one half-1+ edema over the feet.  Pedal pulses are decreased.  There is tenderness to palpation over the right ankle and right wrist without associated erythema or swelling at these joints.  General appearance: Adequately nourished; no acute distress, increased work of breathing is present.   Lymphatic: No lymphadenopathy about the head, neck, axilla. Eyes: No conjunctival inflammation or lid edema is present. There is no scleral icterus. Ears:  External ear exam shows no significant lesions or deformities.   Nose:  External nasal examination shows no deformity or inflammation. Nasal mucosa are pink and moist without lesions, exudates. Neck:  No thyromegaly, masses, tenderness noted.    Heart:  No gallop, murmur, click, rub .  Lungs:  without wheezes, rhonchi, rales, rubs. Abdomen: Bowel sounds are normal. Abdomen is soft and nontender with no organomegaly, hernias, masses. GU: Deferred  Extremities:  No cyanosis, clubbing Neurologic exam :Balance, Rhomberg, finger to nose testing could not be completed due to clinical state Skin: Warm & dry w/o tenting. No significant rash.  See summary under each active problem in the Problem List with associated updated therapeutic plan

## 2020-12-21 NOTE — Assessment & Plan Note (Addendum)
Wound Care Nurse avers no evidence of cellulitis or purulence. Because of his dementia; investigation of the elevated inflammatory markers may require imaging such as MRI of the sacrum and pelvis as he can provide no meaningful history.

## 2020-12-21 NOTE — Assessment & Plan Note (Signed)
Clinically this is compensated.  He has no neck vein distention or hepatojugular reflux.  He does have peripheral edema.

## 2020-12-21 NOTE — Assessment & Plan Note (Addendum)
He continues to complain of tenderness & pain to palpation at the right wrist and right ankle.  There is no associated erythema or swelling.  The inflammatory markers  C-reactive protein and sed rate are profoundly elevated yet his uric acid is extremely low at 3.1.  Films of the wrist and ankle will be performed to see if they do suggest acute gout despite the extremely low uric acid level. With such a low uric acid level his allopurinol dose will be decreased to half.

## 2020-12-21 NOTE — Patient Instructions (Signed)
See assessment and plan under each diagnosis in the problem list and acutely for this visit 

## 2020-12-21 NOTE — Patient Outreach (Signed)
[facility] Post- Acute Care Coordinator follow up. Member screened for potential [facility] Care Management needs.  Mr. Nigh resides in [facility]. Attempted to reach facility SW again to discuss transition plans. Left voicemail message.  Telephone call made to daughter/DPR [facility] [phone]. Patient identifiers confirmed. Haven reports they had family meeting on yesterday. Reports member can not return home. Will need long term placement. Haven reports she plans on contacting [facility] DSS to begin [place] process.   Will email Haven writer's contact information and a list of items to gather to apply for Medicaid. Haven states facility was unable to assist her with applying for Medicaid.   Will touch base with Haven at later time to see how the Medicaid process is going.   Otherwise, there are no identifiable [facility] Care Management needs if Mr. Levee remains long term.    Raiford Noble, MSN, RN,BSN [facility] Post Acute Care Coordinator [phone] [facility]) [phone]  (Toll free office)

## 2020-12-22 ENCOUNTER — Encounter: Payer: Self-pay | Admitting: Internal Medicine

## 2020-12-27 DIAGNOSIS — L89314 Pressure ulcer of right buttock, stage 4: Secondary | ICD-10-CM | POA: Diagnosis not present

## 2021-01-01 ENCOUNTER — Other Ambulatory Visit: Payer: Self-pay

## 2021-01-03 DIAGNOSIS — L89314 Pressure ulcer of right buttock, stage 4: Secondary | ICD-10-CM | POA: Diagnosis not present

## 2021-01-06 ENCOUNTER — Other Ambulatory Visit: Payer: Self-pay | Admitting: Adult Health

## 2021-01-06 ENCOUNTER — Other Ambulatory Visit (HOSPITAL_COMMUNITY): Payer: Self-pay | Admitting: Adult Health

## 2021-01-06 DIAGNOSIS — L89153 Pressure ulcer of sacral region, stage 3: Secondary | ICD-10-CM

## 2021-01-10 ENCOUNTER — Ambulatory Visit (HOSPITAL_COMMUNITY)
Admission: RE | Admit: 2021-01-10 | Discharge: 2021-01-10 | Disposition: A | Discharge status: Home / Self Care | Payer: Medicare Other | Source: Ambulatory Visit | Attending: Adult Health | Admitting: Adult Health

## 2021-01-10 ENCOUNTER — Other Ambulatory Visit: Payer: Self-pay

## 2021-01-10 DIAGNOSIS — L89153 Pressure ulcer of sacral region, stage 3: Secondary | ICD-10-CM | POA: Diagnosis not present

## 2021-01-10 DIAGNOSIS — L89314 Pressure ulcer of right buttock, stage 4: Secondary | ICD-10-CM | POA: Diagnosis not present

## 2021-01-10 DIAGNOSIS — L89159 Pressure ulcer of sacral region, unspecified stage: Secondary | ICD-10-CM | POA: Diagnosis not present

## 2021-01-11 ENCOUNTER — Other Ambulatory Visit: Payer: Self-pay | Admitting: *Deleted

## 2021-01-11 ENCOUNTER — Encounter: Payer: Self-pay | Admitting: Adult Health

## 2021-01-11 ENCOUNTER — Non-Acute Institutional Stay (SKILLED_NURSING_FACILITY): Payer: Medicare Other | Admitting: Adult Health

## 2021-01-11 DIAGNOSIS — M4628 Osteomyelitis of vertebra, sacral and sacrococcygeal region: Secondary | ICD-10-CM

## 2021-01-11 NOTE — Progress Notes (Signed)
Location:  [facility] Room Number: 224 A Place of Service:  SNF (31) Provider:  Kenard Gower, DNP, FNP-BC  Patient Care Team: Hoy Register, MD as PCP - General (Family Medicine)  Extended Emergency Contact Information Primary Emergency Contact: Peterson Lombard  [place] Home Phone: [phone] Relation: Daughter Secondary Emergency Contact: SAPP,RON Home Phone: [phone] Relation: Other  Code Status:  Full Code  Goals of care: Advanced Directive information Advanced Directives 12/21/2020  Does Patient Have a Medical Advance Directive? No  Would patient like information on creating a medical advance directive? No - Patient declined  Pre-existing out of facility DNR order (yellow form or pink MOST form) -     Chief Complaint  Patient presents with  . Acute Visit    Abnormal sacral imaging    HPI:  Pt is a 70 y.o. male seen today for an abnormal MRI. He is a short-term care resident of [facility]. He has a chronic sacral wound. MRI of sacral area done on 01/10/21 showed osteomyelitis of the inferior sacral crest and sacrococcygeal junction. No reported fever. He has a PMH of atrial fibrillation on Eliquis, dilated cardiomyopathy, chronic systolic CHF and chronic kidney disease a stage III.   Past Medical History:  Diagnosis Date  . Chronic systolic CHF (congestive heart failure) (HCC)    EF 35-40, diffuse HK, mild MR, moderate LAE, mild RAE, PASP 44, L pleural eff  . Dilated cardiomyopathy (HCC)    likely related to tachycardia  . Persistent atrial fibrillation [facility])    Past Surgical History:  Procedure Laterality Date  . CARDIOVERSION N/A 08/20/2017   Procedure: CARDIOVERSION;  Surgeon: [name], MD;  Location: [facility] ENDOSCOPY;  Service: Cardiovascular;  Laterality: N/A;  . INCISION AND DRAINAGE ABSCESS Left 03/22/2014   Procedure: INCISION AND DRAINAGE ABSCESS LEFT BUTTOCK ABSCESS;  Surgeon:  [name], MD;  Location: [facility] OR;  Service: General;  Laterality: Left;    No Known Allergies  Outpatient Encounter Medications as of 01/11/2021  Medication Sig  . acetaminophen (TYLENOL) 325 MG tablet Take 650 mg by mouth every 6 (six) hours as needed. (Patient not taking: Reported on 12/22/2020)  . acetaminophen (TYLENOL) 650 MG suppository Place 1 suppository (650 mg total) rectally every 6 (six) hours as needed for fever.  Marland Kitchen allopurinol (ZYLOPRIM) 300 MG tablet Take 150 mg by mouth daily.  . Amino Acids-Protein Hydrolys (PRO-STAT PO) Take 30 mLs by mouth 2 (two) times daily.  Marland Kitchen apixaban (ELIQUIS) 5 MG TABS tablet TAKE 1 TABLET (5 MG TOTAL) BY MOUTH 2 (TWO) TIMES DAILY.  Marland Kitchen atorvastatin (LIPITOR) 20 MG tablet TAKE 1 TABLET (20 MG TOTAL) BY MOUTH DAILY.  . bisacodyl (DULCOLAX) 10 MG suppository Place 10 mg rectally as needed for moderate constipation.  . colchicine 0.6 MG tablet Take 1.2 mg by mouth. At the onset of a GOUT Flares and repeat 0.6 in 1 hour if symptoms persist  . collagenase (SANTYL) ointment Apply 1 application topically daily. APPLY TO RIGHT BUTTOCKS. COVER WITH A SALINE GUAZE , THEN DRY GUAZE OR ABD PAD DAILY  . feeding supplement (ENSURE ENLIVE / ENSURE PLUS) LIQD Take 237 mLs by mouth 2 (two) times daily between meals.  . ferrous sulfate 325 (65 FE) MG tablet Take 325 mg by mouth daily with breakfast.  . Magnesium Hydroxide (MILK OF MAGNESIA PO) Take 30 mLs by mouth as needed.  . metoprolol tartrate (LOPRESSOR) 25 MG tablet Take 1 tablet (25 mg  total) by mouth 2 (two) times daily.  . midodrine (PROAMATINE) 2.5 MG tablet Take 1 tablet (2.5 mg total) by mouth 3 (three) times daily with meals.  Marland Kitchen neomycin-bacitracin-polymyxin (NEOSPORIN) OINT Apply 1 application topically 2 (two) times daily. Please apply to glans penis wound BID.  Marland Kitchen NON FORMULARY Med pass 120 ml by mouth twice a day for wound healing  . pantoprazole (PROTONIX) 40 MG tablet Take 1 tablet (40 mg total) by  mouth daily.  . Sodium Phosphates (RA SALINE ENEMA RE) Place rectally as needed.  . tamsulosin (FLOMAX) 0.4 MG CAPS capsule Take 1 capsule (0.4 mg total) by mouth daily after supper.  . traMADol (ULTRAM) 50 MG tablet Give 1/2 tab = 25 mg by mouth BID  . [DISCONTINUED] furosemide (LASIX) 20 MG tablet Take 1 tablet (20 mg total) by mouth daily.  . [DISCONTINUED] metoprolol succinate (TOPROL-XL) 25 MG 24 hr tablet Take 1 tablet (25 mg total) by mouth daily. Take with or immediately following a meal. (Patient taking differently: Take 25 mg by mouth daily. Take with or immediately following a meal. LD UNK time)   No facility-administered encounter medications on file as of 01/11/2021.    Review of Systems  Unable to obtain due to neurocognitive deficit.    Immunization History  Administered Date(s) Administered  . DTaP 12/14/2020  . Pneumococcal Conjugate-13 10/28/2018  . Pneumococcal Polysaccharide-23 09/08/2020   Pertinent  Health Maintenance Due  Topic Date Due  . COLONOSCOPY (Pts 45-25yrs Insurance coverage will need to be confirmed)  Never done  . INFLUENZA VACCINE  05/02/2021  . PNA vac Low Risk Adult  Completed   Fall Risk  09/08/2020 06/08/2020 10/28/2018 07/10/2018 04/09/2018  Falls in the past year? 0 0 0 No No  Number falls in past yr: 0 - - - -  Injury with Fall? 0 - - - -  Risk for fall due to : - No Fall Risks - - -     Vitals:   01/11/21 1716  BP: 110/74  Pulse: 84  Resp: 18  Temp: 97.7 F (36.5 C)  Weight: 184 lb 4.8 oz (83.6 kg)  Height: 6' (1.829 m)   Body mass index is 25 kg/m.  Physical Exam  GENERAL APPEARANCE: Well nourished. In no acute distress. Normal body habitus SKIN:  Sacral decubitus with dressing MOUTH and THROAT: Lips are without lesions. Oral mucosa is moist and without lesions.  RESPIRATORY: Breathing is even & unlabored, BS CTAB CARDIAC: RRR, no murmur,no extra heart sounds, no edema GI: Abdomen soft, normal BS, no masses, no  tenderness NEUROLOGICAL: There is no tremor. Speech is clear. PSYCHIATRIC:  Affect and behavior are appropriate  Labs reviewed: Recent Labs    11/10/20 1508 11/10/20 2000 11/14/20 0310 11/15/20 0306 11/16/20 0310 11/22/20 0000 12/14/20 0000  NA 167*   < > 148* 142 141 140 136*  K 4.1   < > 3.9 3.7 3.8 4.7 4.1  CL >130*   < > 119* 114* 114* 103 101  CO2 20*   < > 21* 21* 21* 25* 25*  GLUCOSE 152*   < > 122* 202* 118*  --   --   BUN 85*   < > 37* 28* 24* 14 25*  CREATININE 2.58*   < > 1.41* 1.46* 1.22 1.1 1.2  CALCIUM 8.5*   < > 8.3* 8.0* 8.0* 8.2* 8.5*  MG  --   --  2.2  --   --   --   --  PHOS 3.5  --   --   --   --   --   --    < > = values in this interval not displayed.   Recent Labs    11/12/20 0345 11/13/20 0304 11/14/20 0310  AST 27 32 38  ALT 26 41 47*  ALKPHOS 56 64 71  BILITOT 0.6 0.7 0.7  PROT 4.6* 4.8* 4.8*  ALBUMIN 2.2* 2.1* 2.0*   Recent Labs    11/12/20 0345 11/13/20 0304 11/14/20 0310 11/16/20 0310 12/14/20 0000  WBC 8.6 6.8 9.4 10.1 8.5  NEUTROABS 7.2 5.5 7.5  --   --   HGB 11.7* 11.9* 12.3* 10.5* 8.3*  HCT 36.7* 35.9* 37.0* 31.5* 25*  MCV 89.5 86.5 85.1 85.1  --   PLT 208 191 214 225 575*   Lab Results  Component Value Date   TSH 2.627 11/14/2020   Lab Results  Component Value Date   HGBA1C 5.3 06/08/2020   Lab Results  Component Value Date   CHOL 139 10/28/2018   HDL 39 (L) 10/28/2018   LDLCALC 65 10/28/2018   TRIG 174 (H) 10/28/2018   CHOLHDL 3.6 10/28/2018    Significant Diagnostic Results in last 30 days:  MR SACRUM SI JOINTS WO CONTRAST  Result Date: 01/11/2021 CLINICAL DATA:  Sacral decubitus ulcer status post debridement. EXAM: MRI SACRUM WITHOUT CONTRAST TECHNIQUE: Multiplanar, multisequence MR imaging of the sacrum was performed. No intravenous contrast was administered. COMPARISON:  CT pelvis dated March 22, 2014. FINDINGS: Bones/Joint/Cartilage Abnormal marrow edema with corresponding decreased T1 marrow signal  involving the inferior sacral crest and sacrococcygeal junction (series 7, image 17). No fracture or dislocation. Normal alignment. No joint effusion. Ligaments Collateral ligaments are intact. Muscles and Tendons Right greater than left gluteal and piriformis muscle edema. Symmetric bilateral lower paraspinous and iliacus muscle edema. Soft tissue Right-sided sacral decubitus ulcer with small amount of superior and inferior undermining containing fluid. No fluid collection. No soft tissue mass. Mild presacral edema. Incompletely visualized bladder wall thickening likely related to chronic outlet obstruction from prostate median lobe hypertrophy indenting the bladder base. IMPRESSION: 1. Right-sided sacral decubitus ulcer with underlying osteomyelitis of the inferior sacral crest and sacrococcygeal junction. No abscess. Electronically Signed   By: Obie Dredge M.D.   On: 01/11/2021 16:17    Assessment/Plan  1. Sacral osteomyelitis (HCC) -  Will start on Doxycycline 100 mg BID X 30 days and Florastor 250 mg 1 capsule BID X 33 days -  Refer for Infectious Disease consult -  Continue sacral wound treatment daily -  Continue supplementation with med pass 120 ml BID and Ensure 237 ml BID for supplementation    Family/ staff Communication:   Discussed plan of care with charge nurse.  Labs/tests ordered:  None  Goals of care:   Short-term care   Kenard Gower, DNP, MSN, FNP-BC [facility] [phone] (Monday-Friday 8:00 a.m. - 5:00 p.m.) [phone] (after hours)

## 2021-01-11 NOTE — Patient Outreach (Signed)
[facility] Post-Acute Care Coordinator follow up. Member screened for potential [facility] Care Management needs.  Mr. Zufall remains in [facility]. Telephone call made to daughter/DPR [facility] [phone]. Patient identifiers confirmed. Writer inquired about status of Medicaid application process. Haven states that she has the necessary documents and has plans to go to DSS office today. Denies needing any assistance.   Haven reports transition plan is for member to remain at [facility] for LTC.   No identifiable [facility] Care Management needs if member remains LTC.  Raiford Noble, MSN, RN,BSN [facility] Post Acute Care Coordinator [phone] [facility] [phone]  (Toll free office)

## 2021-01-13 ENCOUNTER — Non-Acute Institutional Stay (SKILLED_NURSING_FACILITY): Payer: Medicare Other | Admitting: Internal Medicine

## 2021-01-13 ENCOUNTER — Encounter: Payer: Self-pay | Admitting: Internal Medicine

## 2021-01-13 DIAGNOSIS — I10 Essential (primary) hypertension: Secondary | ICD-10-CM | POA: Diagnosis not present

## 2021-01-13 DIAGNOSIS — R4189 Other symptoms and signs involving cognitive functions and awareness: Secondary | ICD-10-CM | POA: Diagnosis not present

## 2021-01-13 DIAGNOSIS — R29818 Other symptoms and signs involving the nervous system: Secondary | ICD-10-CM | POA: Diagnosis not present

## 2021-01-13 DIAGNOSIS — G9341 Metabolic encephalopathy: Secondary | ICD-10-CM | POA: Diagnosis not present

## 2021-01-13 DIAGNOSIS — I4819 Other persistent atrial fibrillation: Secondary | ICD-10-CM | POA: Diagnosis not present

## 2021-01-13 NOTE — Assessment & Plan Note (Addendum)
Staff questions seizure manifested as decreased responsiveness and generalized shaking.  There is no evidence of stool urinary incontinence and no injury to his tongue.  Extensive labs will be checked to rule out any chemical issues.

## 2021-01-13 NOTE — Progress Notes (Signed)
   NURSING HOME LOCATION:  [facility] ROOM NUMBER:  [id number]  CODE STATUS:  Full Code  PCP:  Hoy Register MD  This is a nursing facility follow up visit for specific acute issue of possible seizure.  Interim medical record and care since last SNF visit was updated with review of diagnostic studies and change in clinical status since last visit were documented.  HPI: Staff reports that he was found to be noncommunicative with generalized body shaking, especially of hands for 5 minutes.  Initially it was questioned whether he was taking Keppra for seizures.  Chart was reviewed and he is not on Keppra and there is no history of seizure disorder. Stat glucose was 85. Staff reported he may have fallen on April 12. He was found between the bed & chest of drawers as if he had rolled from bed. Assessment & subsequent neuro checks revealed no injury.  Past medical history includes persistent A. fib, essential hypertension, dilated cardiomyopathy, and chronic systolic congestive heart failure. He has a history of cardioversion in 2018.  Review of systems: He is essentially nonverbal.  Occasionally he will answer with a soft monosyllabic "no".  He does not follow commands.  He did validate that he had a seizure.  He would not answer me if he had stool or urinary incontinence. Staff reports that he had been feeding himself but recently intake had decreased.  Physical exam:  Pertinent or positive findings: He stares blankly and will not follow commands.  Any responses were slow and markedly delayed.  To assess extraocular motion I had to move from one side of the bed to the other as his gaze focused on me. Lower lids are puffy.  The tongue shows no sign of injury.  Chest is barrel shaped.  Heart sounds are markedly distant and essentially cannot be auscultated.  Feeling the carotid pulses reveals irregular rhythm.  He will not oppose my hands to test strength.  Pedal pulses are  decreased and virtually not palpable due to 1.5+ edema.  He would not move his lower extremities at all.  Deep tendon reflexes were 1.5+ in the arms.  No ecchymosis, contusions, or abrasions are present over the head & neck.  General appearance: Adequately nourished; no acute distress, increased work of breathing is present.   Lymphatic: No lymphadenopathy about the head, neck, axilla. Eyes: No conjunctival inflammation is present. There is no scleral icterus. Ears:  External ear exam shows no significant lesions or deformities.   Nose:  External nasal examination shows no deformity or inflammation. Nasal mucosa are pink and moist without lesions, exudates Oral exam:  Lips and gums are healthy appearing. There is no oropharyngeal erythema or exudate. Neck:  No thyromegaly, masses, tenderness noted.    Lungs: without wheezes, rhonchi, rales, rubs. Abdomen: Bowel sounds are normal. Abdomen is soft and nontender with no organomegaly, hernias, masses. GU: Deferred  Extremities:  No cyanosis, clubbing  Neurologic exam :Balance, Rhomberg, finger to nose testing could not be completed due to clinical state Skin: Warm & dry w/o tenting. No significant lesions or rash.  See summary under each active problem in the Problem List with associated updated therapeutic plan

## 2021-01-13 NOTE — Patient Instructions (Signed)
See assessment and plan under each diagnosis in the problem list and acutely for this visit 

## 2021-01-13 NOTE — Assessment & Plan Note (Addendum)
He is on midodrine on which can cause bradycardia especially with concomitant Beta blocker therapy. Clinically he remains in A. Fib. EKG confirmed AF with RVR.  Midodrine held & Beta blocker dose decreased. CCB added for rate control.

## 2021-01-13 NOTE — Assessment & Plan Note (Addendum)
Today he is essentially nonverbal with rare monosyllabic " no"  as markedly delayed response to queries.  He can provide no history.  Staff to observe while eating. Prior coprolalia not exhibited today. Speech Therapy will be asked to assess risk for aspiration

## 2021-01-14 NOTE — Assessment & Plan Note (Signed)
BP actually low; Beta blocker dose decreased. Low dose CCB added for rate control of AF.

## 2021-01-17 DIAGNOSIS — L89314 Pressure ulcer of right buttock, stage 4: Secondary | ICD-10-CM | POA: Diagnosis not present

## 2021-01-18 ENCOUNTER — Encounter: Payer: Self-pay | Admitting: Adult Health

## 2021-01-18 ENCOUNTER — Non-Acute Institutional Stay (SKILLED_NURSING_FACILITY): Payer: Medicare Other | Admitting: Adult Health

## 2021-01-18 DIAGNOSIS — R569 Unspecified convulsions: Secondary | ICD-10-CM | POA: Diagnosis not present

## 2021-01-18 DIAGNOSIS — N179 Acute kidney failure, unspecified: Secondary | ICD-10-CM | POA: Diagnosis not present

## 2021-01-18 DIAGNOSIS — D509 Iron deficiency anemia, unspecified: Secondary | ICD-10-CM | POA: Diagnosis not present

## 2021-01-18 NOTE — Progress Notes (Signed)
Location:  [facility] Room Number: 201 B Place of Service:  SNF (31) Provider:  Kenard Gower, DNP, FNP-BC  Patient Care Team: Hoy Register, MD as PCP - General (Family Medicine)  Extended Emergency Contact Information Primary Emergency Contact: Peterson Lombard  [place] Home Phone: [phone] Relation: Daughter Secondary Emergency Contact: SAPP,RON Home Phone: [phone] Relation: Other  Code Status:    Goals of care: Advanced Directive information Advanced Directives 12/21/2020  Does Patient Have a Medical Advance Directive? No  Would patient like information on creating a medical advance directive? No - Patient declined  Pre-existing out of facility DNR order (yellow form or pink MOST form) -     Chief Complaint  Patient presents with  . Acute Visit    Anemia, AKI, seizure-like eoisode    HPI:  Pt is a 71 y.o. male seen today for a noted decline in her hgb. Latest hgb 8.0, decline from 8.3, 12/14/20. He takes FeSO4 325 mg daily for anemia. Staff reported that he has poor oral intake. Latest creatinine 1.32, increased from 1.17, GFR 54.87, down from 63.53, Na 141. Last week, staff reported patient to be noncommunicative with generalized body shaking especially of hands for 5 minutes. No reported repeat seizure-like episodes. Staff, also, reported patient having episode of hematuria. There was no reported fever. He is currently on Doxycycline 100 mg PO BID for sacral osteomyelitis. He has a PMH of atrial fibrillation on Eliquis, dilated cardiomyopathy, chronic systolic CHF and chronic kidney disease stage III.   Past Medical History:  Diagnosis Date  . Chronic systolic CHF (congestive heart failure) (HCC)    EF 35-40, diffuse HK, mild MR, moderate LAE, mild RAE, PASP 44, L pleural eff  . Dilated cardiomyopathy (HCC)    likely related to tachycardia  . Persistent atrial fibrillation [facility])    Past Surgical History:   Procedure Laterality Date  . CARDIOVERSION N/A 08/20/2017   Procedure: CARDIOVERSION;  Surgeon: [name], MD;  Location: [facility] ENDOSCOPY;  Service: Cardiovascular;  Laterality: N/A;  . INCISION AND DRAINAGE ABSCESS Left 03/22/2014   Procedure: INCISION AND DRAINAGE ABSCESS LEFT BUTTOCK ABSCESS;  Surgeon: [name], MD;  Location: [facility] OR;  Service: General;  Laterality: Left;    No Known Allergies  Outpatient Encounter Medications as of 01/18/2021  Medication Sig  . acetaminophen (TYLENOL) 325 MG tablet Take 650 mg by mouth every 6 (six) hours as needed. (Patient not taking: Reported on 12/22/2020)  . acetaminophen (TYLENOL) 650 MG suppository Place 1 suppository (650 mg total) rectally every 6 (six) hours as needed for fever.  Marland Kitchen allopurinol (ZYLOPRIM) 300 MG tablet Take 150 mg by mouth daily.  . Amino Acids-Protein Hydrolys (PRO-STAT PO) Take 30 mLs by mouth 2 (two) times daily.  Marland Kitchen apixaban (ELIQUIS) 5 MG TABS tablet TAKE 1 TABLET (5 MG TOTAL) BY MOUTH 2 (TWO) TIMES DAILY.  Marland Kitchen atorvastatin (LIPITOR) 20 MG tablet TAKE 1 TABLET (20 MG TOTAL) BY MOUTH DAILY.  . bisacodyl (DULCOLAX) 10 MG suppository Place 10 mg rectally as needed for moderate constipation.  . colchicine 0.6 MG tablet Take 1.2 mg by mouth. At the onset of a GOUT Flares and repeat 0.6 in 1 hour if symptoms persist  . collagenase (SANTYL) ointment Apply 1 application topically daily. APPLY TO RIGHT BUTTOCKS. COVER WITH A SALINE GUAZE , THEN DRY GUAZE OR ABD PAD DAILY  . feeding supplement (ENSURE ENLIVE / ENSURE PLUS) LIQD Take 237 mLs by mouth 2 (two)  times daily between meals.  . ferrous sulfate 325 (65 FE) MG tablet Take 325 mg by mouth daily with breakfast.  . Magnesium Hydroxide (MILK OF MAGNESIA PO) Take 30 mLs by mouth as needed.  . metoprolol tartrate (LOPRESSOR) 25 MG tablet Take 1 tablet (25 mg total) by mouth 2 (two) times daily.  . midodrine (PROAMATINE) 2.5 MG tablet Take 1 tablet (2.5 mg total) by mouth 3  (three) times daily with meals.  Marland Kitchen neomycin-bacitracin-polymyxin (NEOSPORIN) OINT Apply 1 application topically 2 (two) times daily. Please apply to glans penis wound BID.  Marland Kitchen NON FORMULARY Med pass 120 ml by mouth twice a day for wound healing  . pantoprazole (PROTONIX) 40 MG tablet Take 1 tablet (40 mg total) by mouth daily.  . Sodium Phosphates (RA SALINE ENEMA RE) Place rectally as needed.  . tamsulosin (FLOMAX) 0.4 MG CAPS capsule Take 1 capsule (0.4 mg total) by mouth daily after supper.  . traMADol (ULTRAM) 50 MG tablet Give 1/2 tab = 25 mg by mouth BID  . [DISCONTINUED] furosemide (LASIX) 20 MG tablet Take 1 tablet (20 mg total) by mouth daily.  . [DISCONTINUED] metoprolol succinate (TOPROL-XL) 25 MG 24 hr tablet Take 1 tablet (25 mg total) by mouth daily. Take with or immediately following a meal. (Patient taking differently: Take 25 mg by mouth daily. Take with or immediately following a meal. LD UNK time)   No facility-administered encounter medications on file as of 01/18/2021.    Review of Systems  Unable to obtain due to cognitive deficit.    Immunization History  Administered Date(s) Administered  . DTaP 12/14/2020  . Pneumococcal Conjugate-13 10/28/2018  . Pneumococcal Polysaccharide-23 09/08/2020   Pertinent  Health Maintenance Due  Topic Date Due  . COLONOSCOPY (Pts 45-75yrs Insurance coverage will need to be confirmed)  Never done  . INFLUENZA VACCINE  05/02/2021  . PNA vac Low Risk Adult  Completed   Fall Risk  09/08/2020 06/08/2020 10/28/2018 07/10/2018 04/09/2018  Falls in the past year? 0 0 0 No No  Number falls in past yr: 0 - - - -  Injury with Fall? 0 - - - -  Risk for fall due to : - No Fall Risks - - -     Vitals:   01/18/21 1432  BP: 110/73  Pulse: 100  Resp: 18  Temp: (!) 97.5 F (36.4 C)  Weight: 185 lb 9.6 oz (84.2 kg)  Height: 6' (1.829 m)   Body mass index is 25.17 kg/m.  Physical Exam  GENERAL APPEARANCE: Well nourished. In no acute  distress. Normal body habitus SKIN:  Sacral decubitus with dressing MOUTH and THROAT: Lips are without lesions. Oral mucosa is moist and without lesions.  RESPIRATORY: Breathing is even & unlabored, BS CTAB CARDIAC: RRR, no murmur,no extra heart sounds, no edema GI: Abdomen soft, normal BS, no masses, no tenderness NEUROLOGICAL: There is no tremor. Speech is clear. Disoriented to time and place. PSYCHIATRIC:  Affect and behavior are appropriate  Labs reviewed: Recent Labs    11/10/20 1508 11/10/20 2000 11/14/20 0310 11/15/20 0306 11/16/20 0310 11/22/20 0000 12/14/20 0000  NA 167*   < > 148* 142 141 140 136*  K 4.1   < > 3.9 3.7 3.8 4.7 4.1  CL >130*   < > 119* 114* 114* 103 101  CO2 20*   < > 21* 21* 21* 25* 25*  GLUCOSE 152*   < > 122* 202* 118*  --   --  BUN 85*   < > 37* 28* 24* 14 25*  CREATININE 2.58*   < > 1.41* 1.46* 1.22 1.1 1.2  CALCIUM 8.5*   < > 8.3* 8.0* 8.0* 8.2* 8.5*  MG  --   --  2.2  --   --   --   --   PHOS 3.5  --   --   --   --   --   --    < > = values in this interval not displayed.   Recent Labs    11/12/20 0345 11/13/20 0304 11/14/20 0310  AST 27 32 38  ALT 26 41 47*  ALKPHOS 56 64 71  BILITOT 0.6 0.7 0.7  PROT 4.6* 4.8* 4.8*  ALBUMIN 2.2* 2.1* 2.0*   Recent Labs    11/12/20 0345 11/13/20 0304 11/14/20 0310 11/16/20 0310 12/14/20 0000  WBC 8.6 6.8 9.4 10.1 8.5  NEUTROABS 7.2 5.5 7.5  --   --   HGB 11.7* 11.9* 12.3* 10.5* 8.3*  HCT 36.7* 35.9* 37.0* 31.5* 25*  MCV 89.5 86.5 85.1 85.1  --   PLT 208 191 214 225 575*   Lab Results  Component Value Date   TSH 2.627 11/14/2020   Lab Results  Component Value Date   HGBA1C 5.3 06/08/2020   Lab Results  Component Value Date   CHOL 139 10/28/2018   HDL 39 (L) 10/28/2018   LDLCALC 65 10/28/2018   TRIG 174 (H) 10/28/2018   CHOLHDL 3.6 10/28/2018    Significant Diagnostic Results in last 30 days:  MR SACRUM SI JOINTS WO CONTRAST  Result Date: 01/11/2021 CLINICAL DATA:  Sacral  decubitus ulcer status post debridement. EXAM: MRI SACRUM WITHOUT CONTRAST TECHNIQUE: Multiplanar, multisequence MR imaging of the sacrum was performed. No intravenous contrast was administered. COMPARISON:  CT pelvis dated March 22, 2014. FINDINGS: Bones/Joint/Cartilage Abnormal marrow edema with corresponding decreased T1 marrow signal involving the inferior sacral crest and sacrococcygeal junction (series 7, image 17). No fracture or dislocation. Normal alignment. No joint effusion. Ligaments Collateral ligaments are intact. Muscles and Tendons Right greater than left gluteal and piriformis muscle edema. Symmetric bilateral lower paraspinous and iliacus muscle edema. Soft tissue Right-sided sacral decubitus ulcer with small amount of superior and inferior undermining containing fluid. No fluid collection. No soft tissue mass. Mild presacral edema. Incompletely visualized bladder wall thickening likely related to chronic outlet obstruction from prostate median lobe hypertrophy indenting the bladder base. IMPRESSION: 1. Right-sided sacral decubitus ulcer with underlying osteomyelitis of the inferior sacral crest and sacrococcygeal junction. No abscess. Electronically Signed   By: Obie Dredge M.D.   On: 01/11/2021 16:17    Assessment/Plan  1. Iron deficiency anemia, unspecified iron deficiency anemia type Lab Results  Component Value Date   HGB 8.3 (A) 12/14/2020   - down to hgb 8.0, 01/14/21 -  Will increase FeSO4 325 mg  1 tab from daily to BID -  Will add Vitamin C 500 mg BID to increase absorption of iron and healing of sacral decubitus  2. AKI (acute kidney injury) (HCC) Lab Results  Component Value Date   NA 136 (A) 12/14/2020   K 4.1 12/14/2020   CO2 25 (A) 12/14/2020   GLUCOSE 118 (H) 11/16/2020   BUN 25 (A) 12/14/2020   CREATININE 1.2 12/14/2020   CALCIUM 8.5 (A) 12/14/2020   GFRNONAA 63.53 12/14/2020   GFRAA 73.63 12/14/2020   -   01/14/21, creatinine 1.32, GFR 63.53, thought to  be due to poor oral  intake -  Will give D5  1/2 NS at IV fluid at 70 ml/hour X 1L via peripheral IV  3. Seizure-like activity (HCC) -   Will start on Depakote sprinkle 125 mg give 2 capsules = 250 mg. BID -  Monitor for seizure episodes     Family/ staff Communication:   Labs/tests ordered:   Urinalysis with culture and sensitivity, BMP on 01/20/2021  Goals of care:   Short-term care   Kenard Gower, DNP, MSN, FNP-BC [facility] [phone] (Monday-Friday 8:00 a.m. - 5:00 p.m.) [phone] (after hours)

## 2021-01-20 ENCOUNTER — Other Ambulatory Visit: Payer: Self-pay | Admitting: Adult Health

## 2021-01-20 MED ORDER — TRAMADOL HCL 50 MG PO TABS: ORAL_TABLET | ORAL | 0 refills | Status: DC

## 2021-01-21 ENCOUNTER — Encounter: Payer: Self-pay | Admitting: Adult Health

## 2021-01-21 ENCOUNTER — Non-Acute Institutional Stay (SKILLED_NURSING_FACILITY): Payer: Medicare Other | Admitting: Adult Health

## 2021-01-21 DIAGNOSIS — N3001 Acute cystitis with hematuria: Secondary | ICD-10-CM

## 2021-01-21 DIAGNOSIS — M4628 Osteomyelitis of vertebra, sacral and sacrococcygeal region: Secondary | ICD-10-CM | POA: Diagnosis not present

## 2021-01-21 DIAGNOSIS — R569 Unspecified convulsions: Secondary | ICD-10-CM | POA: Diagnosis not present

## 2021-01-21 DIAGNOSIS — I4819 Other persistent atrial fibrillation: Secondary | ICD-10-CM

## 2021-01-21 NOTE — Progress Notes (Signed)
Location:  [facility] Room Number: 201/B Place of Service:  SNF (31) Provider:  Kenard Gower, DNP, FNP-BC  Patient Care Team: Hoy Register, MD as PCP - General (Family Medicine)  Extended Emergency Contact Information Primary Emergency Contact: Peterson Lombard  [place] Home Phone: [phone] Relation: Daughter Secondary Emergency Contact: SAPP,RON Home Phone: [phone] Relation: Other  Code Status:  Full Code  Goals of care: Advanced Directive information Advanced Directives 01/21/2021  Does Patient Have a Medical Advance Directive? No  Would patient like information on creating a medical advance directive? No - Patient declined  Pre-existing out of facility DNR order (yellow form or pink MOST form) -     Chief Complaint  Patient presents with  . Acute Visit    Abnormal Urinalysis    HPI:  Pt is a 70 y.o. Christian seen today for urine culture showing > 100,000/ml Lactose fermenting gram-negative rods, with isolate Proteus mirabilis. He has no reported fever. He was reported to have episode of hematuria. He takes Diltiazem 60 mg 1 tab every 8 hours for atrial fibrillation. HRs ranging from 74 to 96 and SBPs ranging from 94 to 118. He was recently started on Depakote sprinkle 125 mg 2 capsules = 250 mg twice a day for seizure-like episode. He is currently taking Doxycycline 100 mg BID for sacral osteomyelitis.   Past Medical History:  Diagnosis Date  . Chronic systolic CHF (congestive heart failure) (HCC)    EF 35-40, diffuse HK, mild MR, moderate LAE, mild RAE, PASP 44, L pleural eff  . Dilated cardiomyopathy (HCC)    likely related to tachycardia  . Persistent atrial fibrillation [facility])    Past Surgical History:  Procedure Laterality Date  . CARDIOVERSION N/A 08/20/2017   Procedure: CARDIOVERSION;  Surgeon: [name], MD;  Location: [facility] ENDOSCOPY;  Service: Cardiovascular;  Laterality: N/A;  . INCISION AND DRAINAGE  ABSCESS Left 03/22/2014   Procedure: INCISION AND DRAINAGE ABSCESS LEFT BUTTOCK ABSCESS;  Surgeon: [name], MD;  Location: [facility] OR;  Service: General;  Laterality: Left;    No Known Allergies  Outpatient Encounter Medications as of 01/21/2021  Medication Sig  . acetaminophen (TYLENOL) 500 MG tablet Take 500 mg by mouth every 6 (six) hours as needed.  Marland Kitchen acetaminophen (TYLENOL) 650 MG suppository Place 1 suppository (650 mg total) rectally every 6 (six) hours as needed for fever.  Marland Kitchen allopurinol (ZYLOPRIM) 300 MG tablet Take 150 mg by mouth daily.  . Amino Acids-Protein Hydrolys (PRO-STAT PO) Take 30 mLs by mouth 2 (two) times daily.  Marland Kitchen apixaban (ELIQUIS) 5 MG TABS tablet TAKE 1 TABLET (5 MG TOTAL) BY MOUTH 2 (TWO) TIMES DAILY.  Marland Kitchen atorvastatin (LIPITOR) 20 MG tablet TAKE 1 TABLET (20 MG TOTAL) BY MOUTH DAILY.  . bisacodyl (DULCOLAX) 10 MG suppository Place 10 mg rectally as needed for moderate constipation.  . colchicine 0.6 MG tablet Take by mouth. GIVE 2 CAPSULES=1.2 MG PO AT FIRST SIGN OF GOUT FLARE UP AND REPEAT 0.6 MG IN 1 HOUR IF SYMPTOMS PERSIST  TAKE 1 CAPSULE BY MOUTH 1 HOUR AFTER TAKING 2 CAPSULE IF GOUT SYMPTOMS HAVE NOT SUBSIDED  . collagenase (SANTYL) ointment Apply topically. Irrigate right buttock with 1/2 strength Dakins solution. Pack with santyl, Dakins moistened gauze and apply secondary dressing daily  . diltiazem (CARDIZEM) 60 MG tablet Take 60 mg by mouth every 8 (eight) hours.  . divalproex (DEPAKOTE) 125 MG DR tablet Take 250 mg by mouth 2 (two)  times daily.  . feeding supplement (ENSURE ENLIVE / ENSURE PLUS) LIQD Take 237 mLs by mouth 2 (two) times daily between meals.  . ferrous sulfate 325 (65 FE) MG tablet Take 325 mg by mouth 2 (two) times daily with a meal.  . Magnesium Hydroxide (MILK OF MAGNESIA PO) Take 30 mLs by mouth as needed.  . midodrine (PROAMATINE) 2.5 MG tablet Take 1 tablet (2.5 mg total) by mouth 3 (three) times daily with meals.  Marland Kitchen  neomycin-bacitracin-polymyxin (NEOSPORIN) OINT Apply 1 application topically 2 (two) times daily. Please apply to glans penis wound BID.  Marland Kitchen NON FORMULARY Med pass 120 ml by mouth twice a day for wound healing  . pantoprazole (PROTONIX) 40 MG tablet Take 1 tablet (40 mg total) by mouth daily.  . polyethylene glycol (MIRALAX / GLYCOLAX) 17 g packet Take 17 g by mouth daily.  . Saccharomyces boulardii (PROBIOTIC) 250 MG CAPS Take 1 capsule by mouth 2 (two) times daily. F daysor 33  . Sodium Phosphates (RA SALINE ENEMA RE) Place rectally as needed.  . tamsulosin (FLOMAX) 0.4 MG CAPS capsule Take 1 capsule (0.4 mg total) by mouth daily after supper.  . traMADol (ULTRAM) 50 MG tablet Give 1/2 tab = 25 mg by mouth BID  . vitamin C (ASCORBIC ACID) 500 MG tablet Take 500 mg by mouth 2 (two) times daily.  . [DISCONTINUED] doxycycline (VIBRAMYCIN) 100 MG capsule Take 100 mg by mouth 2 (two) times daily. For 1 month  . [DISCONTINUED] acetaminophen (TYLENOL) 325 MG tablet Take 650 mg by mouth every 6 (six) hours as needed. (Patient not taking: Reported on 12/22/2020)  . [DISCONTINUED] furosemide (LASIX) 20 MG tablet Take 1 tablet (20 mg total) by mouth daily.  . [DISCONTINUED] metoprolol succinate (TOPROL-XL) 25 MG 24 hr tablet Take 1 tablet (25 mg total) by mouth daily. Take with or immediately following a meal. (Patient taking differently: Take 25 mg by mouth daily. Take with or immediately following a meal. LD UNK time)  . [DISCONTINUED] metoprolol tartrate (LOPRESSOR) 25 MG tablet Take 1 tablet (25 mg total) by mouth 2 (two) times daily.   No facility-administered encounter medications on file as of 01/21/2021.    Review of Systems  Unable to obtain due to dementia.   Immunization History  Administered Date(s) Administered  . DTaP 12/14/2020  . Pneumococcal Conjugate-13 10/28/2018  . Pneumococcal Polysaccharide-23 09/08/2020   Pertinent  Health Maintenance Due  Topic Date Due  . COLONOSCOPY (Pts  45-11yrs Insurance coverage will need to be confirmed)  Never done  . INFLUENZA VACCINE  05/02/2021  . PNA vac Low Risk Adult  Completed   Fall Risk  01/27/2021 09/08/2020 06/08/2020 10/28/2018 07/10/2018  Falls in the past year? 1 0 0 0 No  Number falls in past yr: 0 0 - - -  Injury with Fall? 1 0 - - -  Risk for fall due to : History of fall(s);Impaired balance/gait;Impaired mobility - No Fall Risks - -  Follow up Falls evaluation completed - - - -     Vitals:   01/21/21 1619  BP: (!) 99/58  Pulse: 96  Resp: 18  Temp: (!) 97.5 F (36.4 C)  SpO2: 97%  Weight: 185 lb 9.6 oz (84.2 kg)  Height: 6' (1.829 m)   Body mass index is 25.17 kg/m.  Physical Exam  GENERAL APPEARANCE: Well nourished. In no acute distress. Normal body habitus SKIN:  Right buttock pressure ulcer stage IV MOUTH and THROAT: Lips are without lesions.  Oral mucosa is moist and without lesions.  RESPIRATORY: Breathing is even & unlabored, BS CTAB CARDIAC: RRR, no murmur,no extra heart sounds GI: Abdomen soft, normal BS, no masses, no tenderness NEUROLOGICAL: There is no tremor. Speech is clear. Alert to self, disoriented to time and place. PSYCHIATRIC:  Affect and behavior are appropriate  Labs reviewed: Recent Labs    11/10/20 1508 11/10/20 2000 11/14/20 0310 11/15/20 0306 11/16/20 0310 11/22/20 0000 12/14/20 0000 01/27/21 1025  NA 167*   < > 148* 142 141 140 136* 143  K 4.1   < > 3.9 3.7 3.8 4.7 4.1 4.1  CL >130*   < > 119* 114* 114* 103 101 105  CO2 20*   < > 21* 21* 21* 25* 25* 29  GLUCOSE 152*   < > 122* 202* 118*  --   --  111*  BUN 85*   < > 37* 28* 24* 14 25* 12  CREATININE 2.58*   < > 1.41* 1.46* 1.22 1.1 1.2 0.89  CALCIUM 8.5*   < > 8.3* 8.0* 8.0* 8.2* 8.5* 8.7  MG  --   --  2.2  --   --   --   --   --   PHOS 3.5  --   --   --   --   --   --   --    < > = values in this interval not displayed.   Recent Labs    11/12/20 0345 11/13/20 0304 11/14/20 0310 01/27/21 1025  AST 27 32 38 10   ALT 26 41 47* 7*  ALKPHOS 56 64 71  --   BILITOT 0.6 0.7 0.7 0.3  PROT 4.6* 4.8* 4.8* 5.6*  ALBUMIN 2.2* 2.1* 2.0*  --    Recent Labs    11/12/20 0345 11/13/20 0304 11/14/20 0310 11/16/20 0310 12/14/20 0000 01/27/21 1025  WBC 8.6 6.8 9.4 10.1 8.5 7.9  NEUTROABS 7.2 5.5 7.5  --   --   --   HGB 11.7* 11.9* 12.3* 10.5* 8.3* 9.7*  HCT 36.7* 35.9* 37.0* 31.5* 25* 32.2*  MCV 89.5 86.5 85.1 85.1  --  80.3  PLT 208 191 214 225 575* 446*   Lab Results  Component Value Date   TSH 2.627 11/14/2020   Lab Results  Component Value Date   HGBA1C 5.3 06/08/2020   Lab Results  Component Value Date   CHOL 139 10/28/2018   HDL 39 (L) 10/28/2018   LDLCALC 65 10/28/2018   TRIG 174 (H) 10/28/2018   CHOLHDL 3.6 10/28/2018    Significant Diagnostic Results in last 30 days:  CT Head Wo Contrast  Result Date: 01/24/2021 CLINICAL DATA:  Head injury after fall. EXAM: CT HEAD WITHOUT CONTRAST TECHNIQUE: Contiguous axial images were obtained from the base of the skull through the vertex without intravenous contrast. COMPARISON:  November 09, 2020. FINDINGS: Brain: Mild diffuse cortical atrophy is noted. Mild chronic ischemic white matter disease is noted. No mass effect or midline shift is noted. Ventricular size is within normal limits. There is no evidence of mass lesion, hemorrhage or acute infarction. Vascular: No hyperdense vessel or unexpected calcification. Skull: Normal. Negative for fracture or focal lesion. Sinuses/Orbits: Mild bilateral frontal and ethmoid sinusitis is noted. Other: None. IMPRESSION: Mild bilateral frontal and ethmoid sinusitis. No acute intracranial abnormality seen. Electronically Signed   By: Lupita Raider M.D.   On: 01/24/2021 09:59   MR SACRUM SI JOINTS WO CONTRAST  Result Date: 01/11/2021 CLINICAL DATA:  Sacral decubitus ulcer status post debridement. EXAM: MRI SACRUM WITHOUT CONTRAST TECHNIQUE: Multiplanar, multisequence MR imaging of the sacrum was performed. No  intravenous contrast was administered. COMPARISON:  CT pelvis dated March 22, 2014. FINDINGS: Bones/Joint/Cartilage Abnormal marrow edema with corresponding decreased T1 marrow signal involving the inferior sacral crest and sacrococcygeal junction (series 7, image 17). No fracture or dislocation. Normal alignment. No joint effusion. Ligaments Collateral ligaments are intact. Muscles and Tendons Right greater than left gluteal and piriformis muscle edema. Symmetric bilateral lower paraspinous and iliacus muscle edema. Soft tissue Right-sided sacral decubitus ulcer with small amount of superior and inferior undermining containing fluid. No fluid collection. No soft tissue mass. Mild presacral edema. Incompletely visualized bladder wall thickening likely related to chronic outlet obstruction from prostate median lobe hypertrophy indenting the bladder base. IMPRESSION: 1. Right-sided sacral decubitus ulcer with underlying osteomyelitis of the inferior sacral crest and sacrococcygeal junction. No abscess. Electronically Signed   By: Obie Dredge M.D.   On: 01/11/2021 16:17    Assessment/Plan  1. Acute cystitis with hematuria -  Will start on Ampicillin 500 mg PO every 6 hours PO X 5 days  2. Persistent atrial fibrillation (HCC) -  Will need to hold Diltiazem for HR < 60 and SBP < 100 -  monitor BPs and HRs  3. Sacral osteomyelitis (HCC) -  Continue Doxycycline and Florastor -  For Infectious Medicine referral -  Treatment care daily  4. Seizure-like activity (HCC) -  No reported repeat of seizure episode -  Continue Depakote    Family/ staff Communication:  Discussed plan of care with resident and charge nurse.  Labs/tests ordered:  None  Goals of care:   Long-term care   Kenard Gower, DNP, MSN, FNP-BC [facility] [phone] (Monday-Friday 8:00 a.m. - 5:00 p.m.) [phone] (after hours)

## 2021-01-24 ENCOUNTER — Emergency Department (HOSPITAL_COMMUNITY)
Admission: EM | Admit: 2021-01-24 | Discharge: 2021-01-24 | Disposition: A | Discharge status: Home / Self Care | Payer: Medicare Other | Attending: Emergency Medicine | Admitting: Emergency Medicine

## 2021-01-24 ENCOUNTER — Encounter (HOSPITAL_COMMUNITY): Payer: Self-pay | Admitting: Emergency Medicine

## 2021-01-24 ENCOUNTER — Emergency Department (HOSPITAL_COMMUNITY): Payer: Medicare Other

## 2021-01-24 DIAGNOSIS — S0083XA Contusion of other part of head, initial encounter: Secondary | ICD-10-CM | POA: Diagnosis not present

## 2021-01-24 DIAGNOSIS — I5022 Chronic systolic (congestive) heart failure: Secondary | ICD-10-CM | POA: Insufficient documentation

## 2021-01-24 DIAGNOSIS — W06XXXA Fall from bed, initial encounter: Secondary | ICD-10-CM | POA: Diagnosis not present

## 2021-01-24 DIAGNOSIS — Z7901 Long term (current) use of anticoagulants: Secondary | ICD-10-CM | POA: Diagnosis not present

## 2021-01-24 DIAGNOSIS — I4819 Other persistent atrial fibrillation: Secondary | ICD-10-CM | POA: Diagnosis not present

## 2021-01-24 DIAGNOSIS — N183 Chronic kidney disease, stage 3 unspecified: Secondary | ICD-10-CM | POA: Insufficient documentation

## 2021-01-24 DIAGNOSIS — I130 Hypertensive heart and chronic kidney disease with heart failure and stage 1 through stage 4 chronic kidney disease, or unspecified chronic kidney disease: Secondary | ICD-10-CM | POA: Insufficient documentation

## 2021-01-24 DIAGNOSIS — S0990XA Unspecified injury of head, initial encounter: Secondary | ICD-10-CM

## 2021-01-24 DIAGNOSIS — Z79899 Other long term (current) drug therapy: Secondary | ICD-10-CM | POA: Insufficient documentation

## 2021-01-24 DIAGNOSIS — Z8616 Personal history of COVID-19: Secondary | ICD-10-CM | POA: Diagnosis not present

## 2021-01-24 NOTE — ED Provider Notes (Signed)
[facility] EMERGENCY DEPARTMENT Provider Note   CSN: [id number] Arrival date & time: 01/24/21  2761     History No chief complaint on file.   Buell Parcel is a 70 y.o. male.  70 yo M with a chief complaint of a fall out of bed.  The patient thinks he rolled out of bed.  Struck his head when he fell.  Denies any other injury.  Denies neck pain chest pain abdominal pain extremity pain.  This happened last night about 10 PM.  He denies being stuck on the ground all night.  He is on Eliquis.  The history is provided by the patient and the EMS personnel.  Illness Severity:  Moderate Onset quality:  Gradual Duration:  12 hours Timing:  Constant Progression:  Unchanged Chronicity:  New Associated symptoms: headaches   Associated symptoms: no abdominal pain, no chest pain, no congestion, no diarrhea, no fever, no myalgias, no rash, no shortness of breath and no vomiting        Past Medical History:  Diagnosis Date  . Chronic systolic CHF (congestive heart failure) (HCC)    EF 35-40, diffuse HK, mild MR, moderate LAE, mild RAE, PASP 44, L pleural eff  . Dilated cardiomyopathy (HCC)    likely related to tachycardia  . Persistent atrial fibrillation [facility])     Patient Active Problem List   Diagnosis Date Noted  . Normochromic normocytic anemia 12/14/2020  . Urinary retention 11/18/2020  . Neurocognitive deficits 11/18/2020  . Acute renal failure superimposed on stage 3 chronic kidney disease (HCC) 11/09/2020  . Acute metabolic encephalopathy 11/09/2020  . Hypernatremia 11/09/2020  . Elevated troponin 11/09/2020  . COVID-19 virus infection 11/09/2020  . Hypertension 01/07/2018  . Atrial fibrillation with RVR (HCC) 08/05/2017  . CKD (chronic kidney disease), stage III (HCC) 08/05/2017  . Persistent atrial fibrillation (HCC)   . Chronic systolic CHF (congestive heart failure) (HCC)   . Dilated cardiomyopathy (HCC)   . Shortness of breath   . Gout 07/18/2017   . Left buttock abscess 03/22/2014    Past Surgical History:  Procedure Laterality Date  . CARDIOVERSION N/A 08/20/2017   Procedure: CARDIOVERSION;  Surgeon: [name], MD;  Location: [facility] ENDOSCOPY;  Service: Cardiovascular;  Laterality: N/A;  . INCISION AND DRAINAGE ABSCESS Left 03/22/2014   Procedure: INCISION AND DRAINAGE ABSCESS LEFT BUTTOCK ABSCESS;  Surgeon: [name], MD;  Location: [facility] OR;  Service: General;  Laterality: Left;       Family History  Problem Relation Age of Onset  . Leukemia Father        43  . Cancer Mother     Social History   Tobacco Use  . Smoking status: Never Smoker  . Smokeless tobacco: Never Used  Substance Use Topics  . Alcohol use: Yes    Comment: pt states he "does not drink a lot anymore"  . Drug use: No    Home Medications Prior to Admission medications   Medication Sig Start Date End Date Taking? Authorizing Provider  acetaminophen (TYLENOL) 500 MG tablet Take 500 mg by mouth every 6 (six) hours as needed.    [provider]  acetaminophen (TYLENOL) 650 MG suppository Place 1 suppository (650 mg total) rectally every 6 (six) hours as needed for fever. 11/16/20   Elgergawy, Leana Roe, MD  allopurinol (ZYLOPRIM) 300 MG tablet Take 150 mg by mouth daily.    [provider]  Amino Acids-Protein Hydrolys (PRO-STAT PO) Take 30 mLs  by mouth 2 (two) times daily.    [provider]  apixaban (ELIQUIS) 5 MG TABS tablet TAKE 1 TABLET (5 MG TOTAL) BY MOUTH 2 (TWO) TIMES DAILY. 06/08/20 06/08/21  Hoy Register, MD  atorvastatin (LIPITOR) 20 MG tablet TAKE 1 TABLET (20 MG TOTAL) BY MOUTH DAILY. 06/08/20 06/08/21  Hoy Register, MD  bisacodyl (DULCOLAX) 10 MG suppository Place 10 mg rectally as needed for moderate constipation.    [provider]  colchicine 0.6 MG tablet Take by mouth. GIVE 2 CAPSULES=1.2 MG PO AT FIRST SIGN OF GOUT FLARE UP AND REPEAT 0.6 MG IN 1 HOUR IF SYMPTOMS PERSIST  TAKE 1 CAPSULE BY  MOUTH 1 HOUR AFTER TAKING 2 CAPSULE IF GOUT SYMPTOMS HAVE NOT SUBSIDED    [provider]  collagenase (SANTYL) ointment Apply topically. Irrigate right buttock with 1/2 strength Dakins solution. Pack with santyl, Dakins moistened gauze and apply secondary dressing daily    [provider]  diltiazem (CARDIZEM) 60 MG tablet Take 60 mg by mouth every 8 (eight) hours.    [provider]  divalproex (DEPAKOTE) 125 MG DR tablet Take 250 mg by mouth 2 (two) times daily.    [provider]  doxycycline (VIBRAMYCIN) 100 MG capsule Take 100 mg by mouth 2 (two) times daily. For 1 month    [provider]  feeding supplement (ENSURE ENLIVE / ENSURE PLUS) LIQD Take 237 mLs by mouth 2 (two) times daily between meals. 11/16/20   Elgergawy, Leana Roe, MD  ferrous sulfate 325 (65 FE) MG tablet Take 325 mg by mouth 2 (two) times daily with a meal.    [provider]  Magnesium Hydroxide (MILK OF MAGNESIA PO) Take 30 mLs by mouth as needed.    [provider]  midodrine (PROAMATINE) 2.5 MG tablet Take 1 tablet (2.5 mg total) by mouth 3 (three) times daily with meals. 11/16/20   Elgergawy, Leana Roe, MD  neomycin-bacitracin-polymyxin (NEOSPORIN) OINT Apply 1 application topically 2 (two) times daily. Please apply to glans penis wound BID. 11/16/20   Elgergawy, Leana Roe, MD  NON FORMULARY Med pass 120 ml by mouth twice a day for wound healing    [provider]  pantoprazole (PROTONIX) 40 MG tablet Take 1 tablet (40 mg total) by mouth daily. 11/16/20   Elgergawy, Leana Roe, MD  polyethylene glycol (MIRALAX / GLYCOLAX) 17 g packet Take 17 g by mouth daily.    [provider]  Saccharomyces boulardii (PROBIOTIC) 250 MG CAPS Take 1 capsule by mouth 2 (two) times daily. F daysor 70    [provider]  Sodium Phosphates (RA SALINE ENEMA RE) Place rectally as needed.    [provider]  tamsulosin (FLOMAX) 0.4 MG CAPS capsule Take 1  capsule (0.4 mg total) by mouth daily after supper. 11/16/20   Elgergawy, Leana Roe, MD  traMADol (ULTRAM) 50 MG tablet Give 1/2 tab = 25 mg by mouth BID 01/20/21   Medina-Vargas, Monina C, NP  vitamin C (ASCORBIC ACID) 500 MG tablet Take 500 mg by mouth 2 (two) times daily.    [provider]  furosemide (LASIX) 20 MG tablet Take 1 tablet (20 mg total) by mouth daily. 06/08/20 11/16/20  Hoy Register, MD  metoprolol succinate (TOPROL-XL) 25 MG 24 hr tablet Take 1 tablet (25 mg total) by mouth daily. Take with or immediately following a meal. Patient taking differently: Take 25 mg by mouth daily. Take with or immediately following a meal. LD UNK time  06/08/20 11/16/20  Hoy Register, MD    Allergies    Patient has no known allergies.  Review of Systems   Review of Systems  Constitutional: Negative for chills and fever.  HENT: Negative for congestion and facial swelling.   Eyes: Negative for discharge and visual disturbance.  Respiratory: Negative for shortness of breath.   Cardiovascular: Negative for chest pain and palpitations.  Gastrointestinal: Negative for abdominal pain, diarrhea and vomiting.  Musculoskeletal: Negative for arthralgias and myalgias.  Skin: Negative for color change and rash.  Neurological: Positive for headaches. Negative for tremors and syncope.  Psychiatric/Behavioral: Negative for confusion and dysphoric mood.    Physical Exam Updated Vital Signs There were no vitals taken for this visit.  Physical Exam Vitals and nursing note reviewed.  Constitutional:      Appearance: He is well-developed.  HENT:     Head: Normocephalic.     Comments: There is small right frontal hematoma Eyes:     Pupils: Pupils are equal, round, and reactive to light.  Neck:     Vascular: No JVD.  Cardiovascular:     Rate and Rhythm: Normal rate and regular rhythm.     Heart sounds: No murmur heard. No friction rub. No gallop.   Pulmonary:     Effort: No respiratory  distress.     Breath sounds: No wheezing.  Abdominal:     General: There is no distension.     Tenderness: There is no abdominal tenderness. There is no guarding or rebound.  Musculoskeletal:        General: Normal range of motion.     Cervical back: Normal range of motion and neck supple.     Comments: Palpated from head to toe without any other noted areas of bony tenderness  Skin:    Coloration: Skin is not pale.     Findings: No rash.  Neurological:     Mental Status: He is alert and oriented to person, place, and time.  Psychiatric:        Behavior: Behavior normal.     ED Results / Procedures / Treatments   Labs (all labs ordered are listed, but only abnormal results are displayed) Labs Reviewed - No data to display  EKG None  Radiology CT Head Wo Contrast  Result Date: 01/24/2021 CLINICAL DATA:  Head injury after fall. EXAM: CT HEAD WITHOUT CONTRAST TECHNIQUE: Contiguous axial images were obtained from the base of the skull through the vertex without intravenous contrast. COMPARISON:  November 09, 2020. FINDINGS: Brain: Mild diffuse cortical atrophy is noted. Mild chronic ischemic white matter disease is noted. No mass effect or midline shift is noted. Ventricular size is within normal limits. There is no evidence of mass lesion, hemorrhage or acute infarction. Vascular: No hyperdense vessel or unexpected calcification. Skull: Normal. Negative for fracture or focal lesion. Sinuses/Orbits: Mild bilateral frontal and ethmoid sinusitis is noted. Other: None. IMPRESSION: Mild bilateral frontal and ethmoid sinusitis. No acute intracranial abnormality seen. Electronically Signed   By: Lupita Raider M.D.   On: 01/24/2021 09:59    Procedures Procedures   Medications Ordered in ED Medications - No data to display  ED Course  I have reviewed the triage vital signs and the nursing notes.  Pertinent labs & imaging results that were available during my care of the patient were  reviewed by me and considered in my medical decision making (see chart for details).    MDM Rules/Calculators/A&P  70 yo M with a chief complaints of a fall out of bed.  Happened about 12 hours ago.  Denies any injury other than striking his head on the ground.  Will obtain a CT scan of the head.  CT the head negative for acute intracranial pathology.  Shows sinus disease but patient without sinus complaints acutely.  Will discharge home.  PCP follow-up.  10:04 AM:  I have discussed the diagnosis/risks/treatment options with the patient and believe the pt to be eligible for discharge home to follow-up with PCP. We also discussed returning to the ED immediately if new or worsening sx occur. We discussed the sx which are most concerning (e.g., sudden worsening pain, fever, inability to tolerate by mouth) that necessitate immediate return. Medications administered to the patient during their visit and any new prescriptions provided to the patient are listed below.  Medications given during this visit Medications - No data to display   The patient appears reasonably screen and/or stabilized for discharge and I doubt any other medical condition or other [facility] requiring further screening, evaluation, or treatment in the ED at this time prior to discharge.   Final Clinical Impression(s) / ED Diagnoses Final diagnoses:  Closed head injury, initial encounter  Fall from bed, initial encounter    Rx / DC Orders ED Discharge Orders    None       Melene Plan, DO 01/24/21 1004

## 2021-01-24 NOTE — Discharge Instructions (Addendum)
Your head CT did not show an acute injury inside your skull. Return for confusion, vomiting.

## 2021-01-24 NOTE — ED Triage Notes (Signed)
Pt here from [facility] with c/o a fall last night around 10 pm , pt is on a blood thinner but unknown if he hit his head

## 2021-01-24 NOTE — ED Notes (Signed)
Ptar called for transport back to [facility]

## 2021-01-25 ENCOUNTER — Encounter: Payer: Self-pay | Admitting: Adult Health

## 2021-01-25 ENCOUNTER — Non-Acute Institutional Stay (SKILLED_NURSING_FACILITY): Payer: Medicare Other | Admitting: Adult Health

## 2021-01-25 DIAGNOSIS — N3001 Acute cystitis with hematuria: Secondary | ICD-10-CM | POA: Diagnosis not present

## 2021-01-25 DIAGNOSIS — I4819 Other persistent atrial fibrillation: Secondary | ICD-10-CM | POA: Diagnosis not present

## 2021-01-25 DIAGNOSIS — M4628 Osteomyelitis of vertebra, sacral and sacrococcygeal region: Secondary | ICD-10-CM | POA: Diagnosis not present

## 2021-01-25 DIAGNOSIS — I959 Hypotension, unspecified: Secondary | ICD-10-CM | POA: Diagnosis not present

## 2021-01-25 DIAGNOSIS — I5022 Chronic systolic (congestive) heart failure: Secondary | ICD-10-CM | POA: Diagnosis not present

## 2021-01-25 NOTE — Progress Notes (Signed)
Location:  [facility] Room Number: 201 B Place of Service:  SNF (31) Provider:  Kenard Gower, DNP, FNP-BC  Patient Care Team: Hoy Register, MD as PCP - General (Family Medicine)  Extended Emergency Contact Information Primary Emergency Contact: Peterson Lombard  [place] Home Phone: [phone] Relation: Daughter Secondary Emergency Contact: SAPP,RON Home Phone: [phone] Relation: Other  Code Status:   Full Code  Goals of care: Advanced Directive information Advanced Directives 01/21/2021  Does Patient Have a Medical Advance Directive? No  Would patient like information on creating a medical advance directive? No - Patient declined  Pre-existing out of facility DNR order (yellow form or pink MOST form) -     Chief Complaint  Patient presents with  . Acute Visit    Follow up ED visit    HPI:  Jeffery Christian is a 70 y.o. Jeffery Christian who is a long-term care resident of [facility]. He has a PMH of atrial fibrillation on Eliquis, dilated cardiomyopathy, chronic systolic CHF and chronic kidney disease stage III. He rolled out of bed and hit his head. He takes Eliquis daily for persistent atrial fibrillation.  CT head negative for acute intracranial pathology.  He came back to [facility] with no new orders.   He was seen in his room today. He remembers falling out of bed. He denies having pain. He was noted to have bipedal 2+edema. No SOB noted. He used to take Lasix for chronic systolic heart failure.  SBP ranging from 97 to 131 and HRs ranging from 66 to 97. He takes diltiazem 60 mg 1 tab every 8 hours for atrial fibrillation and midodrine 2.5 mg TID for hypertension. He currently takes ampicillin 500 mg every 6 hours for UTI and on he is fifth day.    Past Medical History:  Diagnosis Date  . Chronic systolic CHF (congestive heart failure) (HCC)    EF 35-40, diffuse HK, mild MR, moderate LAE, mild RAE, PASP 44, L pleural eff   . Dilated cardiomyopathy (HCC)    likely related to tachycardia  . Persistent atrial fibrillation [facility])    Past Surgical History:  Procedure Laterality Date  . CARDIOVERSION N/A 08/20/2017   Procedure: CARDIOVERSION;  Surgeon: [name], MD;  Location: [facility] ENDOSCOPY;  Service: Cardiovascular;  Laterality: N/A;  . INCISION AND DRAINAGE ABSCESS Left 03/22/2014   Procedure: INCISION AND DRAINAGE ABSCESS LEFT BUTTOCK ABSCESS;  Surgeon: [name], MD;  Location: [facility] OR;  Service: General;  Laterality: Left;    No Known Allergies  Outpatient Encounter Medications as of 01/25/2021  Medication Sig  . acetaminophen (TYLENOL) 500 MG tablet Take 500 mg by mouth every 6 (six) hours as needed.  Marland Kitchen acetaminophen (TYLENOL) 650 MG suppository Place 1 suppository (650 mg total) rectally every 6 (six) hours as needed for fever.  Marland Kitchen allopurinol (ZYLOPRIM) 300 MG tablet Take 150 mg by mouth daily.  . Amino Acids-Protein Hydrolys (PRO-STAT PO) Take 30 mLs by mouth 2 (two) times daily.  Marland Kitchen apixaban (ELIQUIS) 5 MG TABS tablet TAKE 1 TABLET (5 MG TOTAL) BY MOUTH 2 (TWO) TIMES DAILY.  Marland Kitchen atorvastatin (LIPITOR) 20 MG tablet TAKE 1 TABLET (20 MG TOTAL) BY MOUTH DAILY.  . bisacodyl (DULCOLAX) 10 MG suppository Place 10 mg rectally as needed for moderate constipation.  . colchicine 0.6 MG tablet Take by mouth. GIVE 2 CAPSULES=1.2 MG PO AT FIRST SIGN OF GOUT FLARE UP AND REPEAT 0.6 MG IN 1 HOUR IF SYMPTOMS PERSIST  TAKE 1 CAPSULE BY MOUTH 1 HOUR AFTER TAKING 2 CAPSULE IF GOUT SYMPTOMS HAVE NOT SUBSIDED  . collagenase (SANTYL) ointment Apply topically. Irrigate right buttock with 1/2 strength Dakins solution. Pack with santyl, Dakins moistened gauze and apply secondary dressing daily  . diltiazem (CARDIZEM) 60 MG tablet Take 60 mg by mouth every 8 (eight) hours.  . divalproex (DEPAKOTE) 125 MG DR tablet Take 250 mg by mouth 2 (two) times daily.  Marland Kitchen doxycycline (VIBRAMYCIN) 100 MG capsule Take 100 mg by mouth 2  (two) times daily. For 1 month  . feeding supplement (ENSURE ENLIVE / ENSURE PLUS) LIQD Take 237 mLs by mouth 2 (two) times daily between meals.  . ferrous sulfate 325 (65 FE) MG tablet Take 325 mg by mouth 2 (two) times daily with a meal.  . Magnesium Hydroxide (MILK OF MAGNESIA PO) Take 30 mLs by mouth as needed.  . midodrine (PROAMATINE) 2.5 MG tablet Take 1 tablet (2.5 mg total) by mouth 3 (three) times daily with meals.  Marland Kitchen neomycin-bacitracin-polymyxin (NEOSPORIN) OINT Apply 1 application topically 2 (two) times daily. Please apply to glans penis wound BID.  Marland Kitchen NON FORMULARY Med pass 120 ml by mouth twice a day for wound healing  . pantoprazole (PROTONIX) 40 MG tablet Take 1 tablet (40 mg total) by mouth daily.  . polyethylene glycol (MIRALAX / GLYCOLAX) 17 g packet Take 17 g by mouth daily.  . Saccharomyces boulardii (PROBIOTIC) 250 MG CAPS Take 1 capsule by mouth 2 (two) times daily. F daysor 33  . Sodium Phosphates (RA SALINE ENEMA RE) Place rectally as needed.  . tamsulosin (FLOMAX) 0.4 MG CAPS capsule Take 1 capsule (0.4 mg total) by mouth daily after supper.  . traMADol (ULTRAM) 50 MG tablet Give 1/2 tab = 25 mg by mouth BID  . vitamin C (ASCORBIC ACID) 500 MG tablet Take 500 mg by mouth 2 (two) times daily.  . [DISCONTINUED] furosemide (LASIX) 20 MG tablet Take 1 tablet (20 mg total) by mouth daily.  . [DISCONTINUED] metoprolol succinate (TOPROL-XL) 25 MG 24 hr tablet Take 1 tablet (25 mg total) by mouth daily. Take with or immediately following a meal. (Patient taking differently: Take 25 mg by mouth daily. Take with or immediately following a meal. LD UNK time)   No facility-administered encounter medications on file as of 01/25/2021.    Review of Systems   unable to obtain due to dementia    Immunization History  Administered Date(s) Administered  . DTaP 12/14/2020  . Pneumococcal Conjugate-13 10/28/2018  . Pneumococcal Polysaccharide-23 09/08/2020   Pertinent  Health  Maintenance Due  Topic Date Due  . COLONOSCOPY (Pts 45-58yrs Insurance coverage will need to be confirmed)  Never done  . INFLUENZA VACCINE  05/02/2021  . PNA vac Low Risk Adult  Completed   Fall Risk  09/08/2020 06/08/2020 10/28/2018 07/10/2018 04/09/2018  Falls in the past year? 0 0 0 No No  Number falls in past yr: 0 - - - -  Injury with Fall? 0 - - - -  Risk for fall due to : - No Fall Risks - - -     Vitals:   01/25/21 1000  BP: 131/71  Pulse: 66  Resp: 19  Temp: 97.9 F (36.6 C)  Weight: 186 lb 3.2 oz (84.5 kg)  Height: 6' (1.829 m)   Body mass index is 25.25 kg/m.  Physical Exam  GENERAL APPEARANCE: Well nourished. In no acute distress. Normal body habitus SKIN: Right buttock pressure ulcer  stage IV with dressing MOUTH and THROAT: Lips are without lesions. Oral mucosa is moist and without lesions.  RESPIRATORY: Breathing is even & unlabored, BS CTAB CARDIAC: RRR, no murmur,no extra heart sounds, no edema GI: Abdomen soft, normal BS, no masses, no tenderness NEUROLOGICAL: There is no tremor. Speech is clear. Alert to self, disoriented to time and place. PSYCHIATRIC:  Affect and behavior are appropriate  Labs reviewed: Recent Labs    11/10/20 1508 11/10/20 2000 11/14/20 0310 11/15/20 0306 11/16/20 0310 11/22/20 0000 12/14/20 0000  NA 167*   < > 148* 142 141 140 136*  K 4.1   < > 3.9 3.7 3.8 4.7 4.1  CL >130*   < > 119* 114* 114* 103 101  CO2 20*   < > 21* 21* 21* 25* 25*  GLUCOSE 152*   < > 122* 202* 118*  --   --   BUN 85*   < > 37* 28* 24* 14 25*  CREATININE 2.58*   < > 1.41* 1.46* 1.22 1.1 1.2  CALCIUM 8.5*   < > 8.3* 8.0* 8.0* 8.2* 8.5*  MG  --   --  2.2  --   --   --   --   PHOS 3.5  --   --   --   --   --   --    < > = values in this interval not displayed.   Recent Labs    11/12/20 0345 11/13/20 0304 11/14/20 0310  AST 27 32 38  ALT 26 41 47*  ALKPHOS 56 64 71  BILITOT 0.6 0.7 0.7  PROT 4.6* 4.8* 4.8*  ALBUMIN 2.2* 2.1* 2.0*   Recent Labs     11/12/20 0345 11/13/20 0304 11/14/20 0310 11/16/20 0310 12/14/20 0000  WBC 8.6 6.8 9.4 10.1 8.5  NEUTROABS 7.2 5.5 7.5  --   --   HGB 11.7* 11.9* 12.3* 10.5* 8.3*  HCT 36.7* 35.9* 37.0* 31.5* 25*  MCV 89.5 86.5 85.1 85.1  --   PLT 208 191 214 225 575*   Lab Results  Component Value Date   TSH 2.627 11/14/2020   Lab Results  Component Value Date   HGBA1C 5.3 06/08/2020   Lab Results  Component Value Date   CHOL 139 10/28/2018   HDL Jeffery (L) 10/28/2018   LDLCALC 65 10/28/2018   TRIG 174 (H) 10/28/2018   CHOLHDL 3.6 10/28/2018    Significant Diagnostic Results in last 30 days:  CT Head Wo Contrast  Result Date: 01/24/2021 CLINICAL DATA:  Head injury after fall. EXAM: CT HEAD WITHOUT CONTRAST TECHNIQUE: Contiguous axial images were obtained from the base of the skull through the vertex without intravenous contrast. COMPARISON:  November 09, 2020. FINDINGS: Brain: Mild diffuse cortical atrophy is noted. Mild chronic ischemic white matter disease is noted. No mass effect or midline shift is noted. Ventricular size is within normal limits. There is no evidence of mass lesion, hemorrhage or acute infarction. Vascular: No hyperdense vessel or unexpected calcification. Skull: Normal. Negative for fracture or focal lesion. Sinuses/Orbits: Mild bilateral frontal and ethmoid sinusitis is noted. Other: None. IMPRESSION: Mild bilateral frontal and ethmoid sinusitis. No acute intracranial abnormality seen. Electronically Signed   By: Lupita Raider M.D.   On: 01/24/2021 09:59   MR SACRUM SI JOINTS WO CONTRAST  Result Date: 01/11/2021 CLINICAL DATA:  Sacral decubitus ulcer status post debridement. EXAM: MRI SACRUM WITHOUT CONTRAST TECHNIQUE: Multiplanar, multisequence MR imaging of the sacrum was performed. No intravenous contrast  was administered. COMPARISON:  CT pelvis dated March 22, 2014. FINDINGS: Bones/Joint/Cartilage Abnormal marrow edema with corresponding decreased T1 marrow signal  involving the inferior sacral crest and sacrococcygeal junction (series 7, image 17). No fracture or dislocation. Normal alignment. No joint effusion. Ligaments Collateral ligaments are intact. Muscles and Tendons Right greater than left gluteal and piriformis muscle edema. Symmetric bilateral lower paraspinous and iliacus muscle edema. Soft tissue Right-sided sacral decubitus ulcer with small amount of superior and inferior undermining containing fluid. No fluid collection. No soft tissue mass. Mild presacral edema. Incompletely visualized bladder wall thickening likely related to chronic outlet obstruction from prostate median lobe hypertrophy indenting the bladder base. IMPRESSION: 1. Right-sided sacral decubitus ulcer with underlying osteomyelitis of the inferior sacral crest and sacrococcygeal junction. No abscess. Electronically Signed   By: Obie Dredge M.D.   On: 01/11/2021 16:17    Assessment/Plan  1. Chronic systolic CHF (congestive heart failure) (HCC) - no SOB, will start on Lasix 20 mg daily  2. Hypotension, unspecified hypotension type - will increase midodrine from 2.5 mg 3 times daily to 5 mg 3 times daily, hold for SBP > 110  3. Persistent atrial fibrillation (HCC) -Continue diltiazem 60 mg every 8 hours, hold for SBP <100  4. Acute cystitis with hematuria -Continue amoxicillin for a total of 5 days  5. Sacral osteomyelitis (HCC) -   Continue doxycycline -  For ID consultation -Wound treatment daily -Followed up by wound consultant    Family/ staff Communication: Discussed plan of care with resident and charge nurse.  Labs/tests ordered: BMP on 01/31/2021  Goals of care:   Long-term care   Kenard Gower, DNP, MSN, FNP-BC [facility] [phone] (Monday-Friday 8:00 a.m. - 5:00 p.m.) [phone] (after hours)

## 2021-01-26 DIAGNOSIS — L89309 Pressure ulcer of unspecified buttock, unspecified stage: Secondary | ICD-10-CM | POA: Insufficient documentation

## 2021-01-26 DIAGNOSIS — M4628 Osteomyelitis of vertebra, sacral and sacrococcygeal region: Secondary | ICD-10-CM | POA: Insufficient documentation

## 2021-01-27 ENCOUNTER — Ambulatory Visit (INDEPENDENT_AMBULATORY_CARE_PROVIDER_SITE_OTHER): Payer: Medicare Other | Admitting: Internal Medicine

## 2021-01-27 ENCOUNTER — Other Ambulatory Visit: Payer: Self-pay

## 2021-01-27 ENCOUNTER — Other Ambulatory Visit: Payer: Self-pay | Admitting: Internal Medicine

## 2021-01-27 ENCOUNTER — Encounter: Payer: Self-pay | Admitting: Internal Medicine

## 2021-01-27 DIAGNOSIS — M4628 Osteomyelitis of vertebra, sacral and sacrococcygeal region: Secondary | ICD-10-CM

## 2021-01-27 DIAGNOSIS — L89319 Pressure ulcer of right buttock, unspecified stage: Secondary | ICD-10-CM | POA: Diagnosis not present

## 2021-01-27 MED ORDER — CEFTRIAXONE SODIUM 250 MG IJ SOLR: 250.0000 mg | Freq: Once | INTRAMUSCULAR | Status: DC

## 2021-01-27 MED ORDER — CEFTRIAXONE SODIUM 1 G IJ SOLR
2.0000 g | INTRAMUSCULAR | 0 refills | Status: DC
  Filled 2021-01-27: qty 1, 1d supply, fill #0

## 2021-01-27 MED ORDER — METRONIDAZOLE 500 MG PO TABS: 500.0000 mg | ORAL_TABLET | Freq: Three times a day (TID) | ORAL | Status: DC

## 2021-01-27 MED ORDER — DEXTROSE 5 % IV SOLN: 2.0000 g | INTRAVENOUS | Status: DC

## 2021-01-27 NOTE — Addendum Note (Signed)
Addended by: Cliffton Asters on: 01/27/2021 11:55 AM   Modules accepted: Orders

## 2021-01-27 NOTE — Progress Notes (Signed)
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  He is in agreement with this plan.  I also talked to him about the importance of attempting to reverse all of the factors leading up to the development of his decubitus ulcer.  That would involve careful offloading of pressure, avoiding fecal and urinary soiling of the wound, wound care and adequate nutrition.  Plan: 1. CBC, CMP, ESR and CRP today 2. Order PICC placement 3. Recommend ceftriaxone 2 g IV daily for 6 weeks 4. Metronidazole 500 mg by mouth 3 times daily for 6 weeks 5. Discontinue doxycycline 6. He can follow-up here as needed 7. Please feel free to take pictures and call me toward the end of therapy  Patient Active Problem List   Diagnosis Date Noted  . Decubitus ulcer of buttock 01/26/2021    Priority: High  . Sacral osteomyelitis ([place]) 01/26/2021    Priority: High  . Left buttock abscess 03/22/2014    Priority: High  . Normochromic normocytic anemia 12/14/2020  . Urinary retention 11/18/2020  . Neurocognitive deficits 11/18/2020  . Acute renal failure superimposed on stage 3 chronic kidney disease ([place]) 11/09/2020  . Acute metabolic encephalopathy 70/62/3762  . Hypernatremia 11/09/2020  . Elevated troponin 11/09/2020  . COVID-19 virus infection 11/09/2020  . Hypertension 01/07/2018  . Atrial fibrillation with RVR ([place]) 08/05/2017  . CKD (chronic kidney disease), stage III ([place]) 08/05/2017  . Persistent atrial fibrillation ([place])   . Chronic systolic CHF (congestive heart failure) ([place])   . Dilated cardiomyopathy ([place])   .  Shortness of breath   . Gout 07/18/2017    Patient's Medications  New Prescriptions   No medications on file  Previous Medications   ACETAMINOPHEN (TYLENOL) 500 MG TABLET    Take 500 mg by mouth every 6 (six) hours as needed.   ACETAMINOPHEN (TYLENOL) 650 MG SUPPOSITORY    Place 1 suppository (650 mg total) rectally every 6 (six) hours as needed for fever.   ALLOPURINOL (ZYLOPRIM) 300 MG TABLET    Take 150 mg by mouth daily.   AMINO ACIDS-PROTEIN HYDROLYS (PRO-STAT PO)    Take 30 mLs by mouth 2 (two) times daily.   APIXABAN (ELIQUIS) 5 MG TABS TABLET    TAKE 1 TABLET (5 MG TOTAL) BY MOUTH 2 (TWO) TIMES DAILY.   ATORVASTATIN (LIPITOR) 20 MG TABLET    TAKE 1 TABLET (20 MG TOTAL) BY MOUTH DAILY.   BISACODYL (DULCOLAX) 10 MG SUPPOSITORY    Place 10 mg rectally as needed for moderate constipation.   COLCHICINE 0.6 MG TABLET    Take by mouth. GIVE 2 CAPSULES=1.2 MG PO AT FIRST SIGN OF GOUT FLARE UP AND REPEAT 0.6 MG IN 1 HOUR IF SYMPTOMS PERSIST  TAKE 1 CAPSULE BY MOUTH 1 HOUR AFTER TAKING 2 CAPSULE IF GOUT SYMPTOMS HAVE NOT SUBSIDED   COLLAGENASE (SANTYL) OINTMENT    Apply topically. Irrigate right buttock with 1/2 strength Dakins solution. Pack with santyl, Dakins moistened gauze and apply secondary dressing daily   DILTIAZEM (CARDIZEM) 60 MG TABLET    Take 60 mg by mouth every 8 (eight) hours.   DIVALPROEX (DEPAKOTE) 125 MG DR TABLET  Take 250 mg by mouth 2 (two) times daily.   DOXYCYCLINE (VIBRAMYCIN) 100 MG CAPSULE    Take 100 mg by mouth 2 (two) times daily. For 1 month   FEEDING SUPPLEMENT (ENSURE ENLIVE / ENSURE PLUS) LIQD    Take 237 mLs by mouth 2 (two) times daily between meals.   FERROUS SULFATE 325 (65 FE) MG TABLET    Take 325 mg by mouth 2 (two) times daily with a meal.   MAGNESIUM HYDROXIDE (MILK OF MAGNESIA PO)    Take 30 mLs by mouth as needed.   MIDODRINE (PROAMATINE) 2.5 MG TABLET    Take 1 tablet (2.5 mg total) by mouth 3 (three) times daily with meals.    NEOMYCIN-BACITRACIN-POLYMYXIN (NEOSPORIN) OINT    Apply 1 application topically 2 (two) times daily. Please apply to glans penis wound BID.   NON FORMULARY    Med pass 120 ml by mouth twice a day for wound healing   PANTOPRAZOLE (PROTONIX) 40 MG TABLET    Take 1 tablet (40 mg total) by mouth daily.   POLYETHYLENE GLYCOL (MIRALAX / GLYCOLAX) 17 G PACKET    Take 17 g by mouth daily.   SACCHAROMYCES BOULARDII (PROBIOTIC) 250 MG CAPS    Take 1 capsule by mouth 2 (two) times daily. F daysor 33   SODIUM PHOSPHATES (RA SALINE ENEMA RE)    Place rectally as needed.   TAMSULOSIN (FLOMAX) 0.4 MG CAPS CAPSULE    Take 1 capsule (0.4 mg total) by mouth daily after supper.   TRAMADOL (ULTRAM) 50 MG TABLET    Give 1/2 tab = 25 mg by mouth BID   VITAMIN C (ASCORBIC ACID) 500 MG TABLET    Take 500 mg by mouth 2 (two) times daily.  Modified Medications   No medications on file  Discontinued Medications   No medications on file    HPI: Jeffery Christian is a 70 y.o. male who was hospitalized on 11/09/2020 after being found down at home and unresponsive.  He had acute encephalopathy, acute kidney injury, urinary retention and atrial fibrillation.  He was found to be positive for COVID-19 infection. His discharge summary did mention a sacral decubitus ulcer.    He was eventually stabilized and transferred to [facility] on 11/17/2019.  The first note there mentions a left gluteal abscess that was debrided and treated with doxycycline.  Subsequent notes described the right-sided gluteal decubitus ulcer.  It appears he was put back on doxycycline 2 weeks ago after an MRI scan showed underlying osteomyelitis in the sacrococcygeal area.  Wound care note on 01/10/2021 showed some necrotic tissue but mentioned that the wound was "improving".  [name] confirms that he has been told that the wound is looking better.  He thinks he has lost about 15 pounds during his illness.  He tells me that he has not been able to get out of  bed.  He denies having any fever, chills or sweats.  Review of Systems: Review of Systems  Constitutional: Negative for chills, diaphoresis, fever and weight loss.  Respiratory: Negative for cough.   Cardiovascular: Negative for chest pain.  Gastrointestinal: Negative for abdominal pain, diarrhea, nausea and vomiting.      Past Medical History:  Diagnosis Date  . Chronic systolic CHF (congestive heart failure) (H[facility])    EF 35-40, diffuse HK, mild MR, moderate LAE, mild RAE, PASP 44, L pleural eff  . Dilated cardiomyopathy ([place])    likely related to tachycardia  .  Persistent atrial fibrillation ([facility])     Social History   Tobacco Use  . Smoking status: Never Smoker  . Smokeless tobacco: Never Used  Substance Use Topics  . Alcohol use: Not Currently    Comment: pt states he "does not drink a lot anymore"  . Drug use: No    Family History  Problem Relation Age of Onset  . Leukemia Father        22  . Cancer Mother    No Known Allergies  OBJECTIVE: Vitals:   01/27/21 1000  BP: 109/74  Pulse: 96  Temp: 98.1 F (36.7 C)  TempSrc: Oral  SpO2: 97%   There is no height or weight on file to calculate BMI.   Physical Exam Constitutional:      Comments: He is pleasant and in no distress resting on an EMS stretcher.  Cardiovascular:     Rate and Rhythm: Normal rate and regular rhythm.     Heart sounds: No murmur heard.     Comments: Distant heart sounds. Pulmonary:     Effort: Pulmonary effort is normal.     Breath sounds: Normal breath sounds.  Abdominal:     Palpations: Abdomen is soft.     Tenderness: There is no abdominal tenderness.  Genitourinary:    Comments: He is wearing a diaper. Musculoskeletal:     Comments: He has a golf ball size area of ulceration on his right buttock.  There is no visible bone but it is right over.  There is some thin bloody drainage.  There is no malodor.  It looks better than pictures taken back in February which showed  necrotic tissue.  Psychiatric:        Mood and Affect: Mood normal.     Microbiology: No results found for this or any previous visit (from the past 240 hour(s)).  Jeffery Bickers, MD [facility] [phone] pager   [phone] cell 01/27/2021, 10:11 AM

## 2021-01-28 LAB — COMPREHENSIVE METABOLIC PANEL
AG Ratio: 1 (calc) (ref 1.0–2.5)
ALT: 7 U/L — ABNORMAL LOW (ref 9–46)
AST: 10 U/L (ref 10–35)
Albumin: 2.8 g/dL — ABNORMAL LOW (ref 3.6–5.1)
Alkaline phosphatase (APISO): 106 U/L (ref 35–144)
BUN: 12 mg/dL (ref 7–25)
CO2: 29 mmol/L (ref 20–32)
Calcium: 8.7 mg/dL (ref 8.6–10.3)
Chloride: 105 mmol/L (ref 98–110)
Creat: 0.89 mg/dL (ref 0.70–1.25)
Globulin: 2.8 g/dL (calc) (ref 1.9–3.7)
Glucose, Bld: 111 mg/dL — ABNORMAL HIGH (ref 65–99)
Potassium: 4.1 mmol/L (ref 3.5–5.3)
Sodium: 143 mmol/L (ref 135–146)
Total Bilirubin: 0.3 mg/dL (ref 0.2–1.2)
Total Protein: 5.6 g/dL — ABNORMAL LOW (ref 6.1–8.1)

## 2021-01-28 LAB — CBC
HCT: 32.2 % — ABNORMAL LOW (ref 38.5–50.0)
Hemoglobin: 9.7 g/dL — ABNORMAL LOW (ref 13.2–17.1)
MCH: 24.2 pg — ABNORMAL LOW (ref 27.0–33.0)
MCHC: 30.1 g/dL — ABNORMAL LOW (ref 32.0–36.0)
MCV: 80.3 fL (ref 80.0–100.0)
MPV: 8.4 fL (ref 7.5–12.5)
Platelets: 446 10*3/uL — ABNORMAL HIGH (ref 140–400)
RBC: 4.01 10*6/uL — ABNORMAL LOW (ref 4.20–5.80)
RDW: 17 % — ABNORMAL HIGH (ref 11.0–15.0)
WBC: 7.9 10*3/uL (ref 3.8–10.8)

## 2021-01-28 LAB — C-REACTIVE PROTEIN: CRP: 28.3 mg/L — ABNORMAL HIGH (ref <8.0)

## 2021-01-28 LAB — SEDIMENTATION RATE: Sed Rate: 70 mm/h — ABNORMAL HIGH (ref 0–20)

## 2021-01-31 DIAGNOSIS — L89314 Pressure ulcer of right buttock, stage 4: Secondary | ICD-10-CM | POA: Diagnosis not present

## 2021-02-01 LAB — COMPREHENSIVE METABOLIC PANEL: Calcium: 8.9 (ref 8.7–10.7)

## 2021-02-01 LAB — BASIC METABOLIC PANEL
BUN: 11 (ref 4–21)
CO2: 29 — AB (ref 13–22)
Chloride: 103 (ref 99–108)
Creatinine: 1 (ref 0.6–1.3)
Glucose: 86
Potassium: 3.8 (ref 3.4–5.3)
Sodium: 141 (ref 137–147)

## 2021-02-07 DIAGNOSIS — L89314 Pressure ulcer of right buttock, stage 4: Secondary | ICD-10-CM | POA: Diagnosis not present

## 2021-02-09 ENCOUNTER — Telehealth: Payer: Self-pay

## 2021-02-09 ENCOUNTER — Telehealth (HOSPITAL_COMMUNITY): Payer: Self-pay

## 2021-02-09 NOTE — Telephone Encounter (Signed)
Spoke with Stanton Kidney from [facility]. She thinks the patient has already had his picc line placed. She will check and give me a call back. AW

## 2021-02-09 NOTE — Telephone Encounter (Signed)
CMA called [facility] to confirm patient had picc line placed as ordered. Spoke with Stanton Kidney who states she is not sure if patient has been scheduled yet. Nurse is not available to confirm if this has been done.  Requested Debra schedule appt with IR for picc placement. Provided number for Morrie Sheldon to help schedule appt. IR was made aware that facility will coordinate appts.  P: [phone] Juanita Laster, RMA

## 2021-02-14 DIAGNOSIS — L89314 Pressure ulcer of right buttock, stage 4: Secondary | ICD-10-CM | POA: Diagnosis not present

## 2021-02-22 ENCOUNTER — Other Ambulatory Visit: Payer: Self-pay | Admitting: Adult Health

## 2021-02-22 ENCOUNTER — Encounter: Payer: Self-pay | Admitting: Internal Medicine

## 2021-02-22 ENCOUNTER — Non-Acute Institutional Stay (SKILLED_NURSING_FACILITY): Payer: Medicare Other | Admitting: Internal Medicine

## 2021-02-22 DIAGNOSIS — I5022 Chronic systolic (congestive) heart failure: Secondary | ICD-10-CM | POA: Diagnosis not present

## 2021-02-22 DIAGNOSIS — I4819 Other persistent atrial fibrillation: Secondary | ICD-10-CM | POA: Diagnosis not present

## 2021-02-22 DIAGNOSIS — N183 Chronic kidney disease, stage 3 unspecified: Secondary | ICD-10-CM | POA: Diagnosis not present

## 2021-02-22 DIAGNOSIS — R609 Edema, unspecified: Secondary | ICD-10-CM | POA: Diagnosis not present

## 2021-02-22 MED ORDER — TRAMADOL HCL 50 MG PO TABS: ORAL_TABLET | ORAL | 0 refills | Status: DC

## 2021-02-22 NOTE — Assessment & Plan Note (Addendum)
Rate is controlled; rhythm is clinically difficult to determine as his heart sounds are markedly distant. On exam Denna Haggard' sign was negative; he is on prophylactic Eliquis.

## 2021-02-22 NOTE — Assessment & Plan Note (Addendum)
1/2+ in left lower extremity and 1+ on right.  Denna Haggard' sign negative.  He is on Eliquis.  The edema is in the context of chronic systolic heart failure, A. fib, and decreased protein stores.  Labs will be updated.

## 2021-02-22 NOTE — Assessment & Plan Note (Signed)
Most recent renal function suggested CKD stage II.  Renal function will be updated.

## 2021-02-22 NOTE — Assessment & Plan Note (Signed)
There is no neck vein distention or hepatojugular reflux.  He does have asymmetric edema of the lower extremities. BNP will be collected.

## 2021-02-22 NOTE — Progress Notes (Signed)
NURSING HOME LOCATION:  [facility] ROOM NUMBER:  [id number]  CODE STATUS:  Full Code  PCP:  Hoy Register MD  This is a nursing facility follow up visit for specific acute issue of LLE edema.  Interim medical record and care since last SNF visit was updated with review of diagnostic studies and change in clinical status since last visit were documented.  HPI: Staff sent a message concerning change in condition related to edema in the left lower extremity with a request for TED stocking. The edema is in the context of a history of atrial fibrillation, dilated cardiomyopathy, and chronic systolic congestive heart failure.  Ejection fraction has been documented as 35-40%.  Procedures include cardioversion in 2018. Most recent labs were 01/27/2021 with a creatinine of 0.89.  GFR has been over 60.  Total protein was reduced at 5.6 at that time.  His chronic anemia had improved slightly with H/H values of 9.7/32.2.  Review of systems: Dementia invalidated responses. Date given as "22".  He could not give me the month or the day of the month.  His response was "Damn, not sure".  When asked the POTUS's name he stated "that white haired SOB".  He denied any active cardiopulmonary symptoms beyond the swelling.  He told me that the swelling began "1 year ago".  Constitutional: No fever, significant weight change, fatigue  Eyes: No redness, discharge, pain, vision change ENT/mouth: No nasal congestion,  purulent discharge, earache, change in hearing, sore throat  Cardiovascular: No chest pain, palpitations, paroxysmal nocturnal dyspnea, claudication  Respiratory: No cough, sputum production, hemoptysis, significant snoring, apnea   Gastrointestinal: No heartburn, dysphagia, abdominal pain, nausea /vomiting, rectal bleeding, melena, change in bowels Genitourinary: No dysuria, hematuria, pyuria, incontinence, nocturia Musculoskeletal: No joint stiffness, joint swelling, weakness,  pain Dermatologic: No rash, pruritus, change in appearance of skin Neurologic: No dizziness, headache, syncope, seizures, numbness, tingling Psychiatric: No significant anxiety, depression, insomnia, anorexia Endocrine: No change in hair/skin/nails, excessive thirst, excessive hunger, excessive urination  Hematologic/lymphatic: No significant bruising, lymphadenopathy, abnormal bleeding Allergy/immunology: No itchy/watery eyes, significant sneezing, urticaria, angioedema  Physical exam:  Pertinent or positive findings: He was initially asleep after 4 PM but easily arousable.  He seemed to have a puzzled look on his face when queried.  Pattern alopecia is present.  The lids are puffy, especially the lower lids.  Ptosis is present greater on the right than the left.  Chest is barrel-shaped and breath sounds and heart sounds are markedly distant.  Rhythm could not be determined but the rate clinically was slow.  There was no neck vein distention at 15 degrees and there was no hepatojugular reflux.  Dorsalis pedis pulses were weaker than the posterior tibial pulses.  1/2+ edema was present in the left lower extremity and 1+ in the right lower extremity.  General appearance: Adequately nourished; no acute distress, increased work of breathing is present.   Lymphatic: No lymphadenopathy about the head, neck, axilla. Eyes: No conjunctival inflammation or lid edema is present. There is no scleral icterus. Ears:  External ear exam shows no significant lesions or deformities.   Nose:  External nasal examination shows no deformity or inflammation. Nasal mucosa are pink and moist without lesions, exudates Oral exam:  Lips and gums are healthy appearing. There is no oropharyngeal erythema or exudate. Neck:  No thyromegaly, masses, tenderness noted.    Heart:  without gallop, murmur, click, rub .  Lungs:  without wheezes, rhonchi, rales, rubs.  Abdomen: Bowel sounds are normal. Abdomen is soft and nontender  with no organomegaly, hernias, masses. GU: Deferred  Extremities:  No cyanosis, clubbing Neurologic exam :Balance, Rhomberg, finger to nose testing could not be completed due to clinical state Skin: Warm & dry w/o tenting. No significant lesions or rash.  See summary under each active problem in the Problem List with associated updated therapeutic plan

## 2021-02-22 NOTE — Patient Instructions (Signed)
See assessment and plan under each diagnosis in the problem list and acutely for this visit 

## 2021-02-23 LAB — BASIC METABOLIC PANEL
BUN: 24 — AB (ref 4–21)
CO2: 23 — AB (ref 13–22)
Chloride: 105 (ref 99–108)
Creatinine: 1 (ref 0.6–1.3)
Glucose: 79
Potassium: 4.4 (ref 3.4–5.3)
Sodium: 143 (ref 137–147)

## 2021-02-23 LAB — CBC: RBC: 4.52 (ref 3.87–5.11)

## 2021-02-23 LAB — CBC AND DIFFERENTIAL
HCT: 36 — AB (ref 41–53)
Hemoglobin: 11.6 — AB (ref 13.5–17.5)
WBC: 6.2

## 2021-02-23 LAB — COMPREHENSIVE METABOLIC PANEL
Albumin: 3.1 — AB (ref 3.5–5.0)
Calcium: 9.1 (ref 8.7–10.7)
GFR calc Af Amer: 86.8
GFR calc non Af Amer: 74.89

## 2021-03-07 DIAGNOSIS — L89314 Pressure ulcer of right buttock, stage 4: Secondary | ICD-10-CM | POA: Diagnosis not present

## 2021-03-07 DIAGNOSIS — I10 Essential (primary) hypertension: Secondary | ICD-10-CM | POA: Diagnosis not present

## 2021-03-07 LAB — LIPID PANEL
Cholesterol: 95 (ref 0–200)
HDL: 24 — AB (ref 35–70)
LDL Cholesterol: 44
Triglycerides: 137 (ref 40–160)

## 2021-03-08 ENCOUNTER — Telehealth: Payer: Self-pay

## 2021-03-08 NOTE — Telephone Encounter (Signed)
Progress notes on file sent from; [facility], phone: [phone]. Copy of referral order have been sent to scheduling

## 2021-03-11 ENCOUNTER — Inpatient Hospital Stay (HOSPITAL_COMMUNITY)
Admission: EM | Admit: 2021-03-11 | Discharge: 2021-03-16 | DRG: 871 | Disposition: A | Discharge status: Skilled Nursing Facility | Payer: Medicare Other | Source: Skilled Nursing Facility | Attending: Internal Medicine | Admitting: Internal Medicine

## 2021-03-11 ENCOUNTER — Encounter (HOSPITAL_COMMUNITY): Payer: Self-pay

## 2021-03-11 ENCOUNTER — Inpatient Hospital Stay (HOSPITAL_COMMUNITY): Payer: Medicare Other

## 2021-03-11 ENCOUNTER — Emergency Department (HOSPITAL_COMMUNITY): Payer: Medicare Other

## 2021-03-11 DIAGNOSIS — I9589 Other hypotension: Secondary | ICD-10-CM | POA: Diagnosis present

## 2021-03-11 DIAGNOSIS — R946 Abnormal results of thyroid function studies: Secondary | ICD-10-CM | POA: Diagnosis present

## 2021-03-11 DIAGNOSIS — Z743 Need for continuous supervision: Secondary | ICD-10-CM | POA: Diagnosis not present

## 2021-03-11 DIAGNOSIS — Z8616 Personal history of COVID-19: Secondary | ICD-10-CM

## 2021-03-11 DIAGNOSIS — R5381 Other malaise: Secondary | ICD-10-CM | POA: Diagnosis not present

## 2021-03-11 DIAGNOSIS — L89154 Pressure ulcer of sacral region, stage 4: Secondary | ICD-10-CM | POA: Diagnosis present

## 2021-03-11 DIAGNOSIS — I130 Hypertensive heart and chronic kidney disease with heart failure and stage 1 through stage 4 chronic kidney disease, or unspecified chronic kidney disease: Secondary | ICD-10-CM | POA: Diagnosis present

## 2021-03-11 DIAGNOSIS — M1A09X0 Idiopathic chronic gout, multiple sites, without tophus (tophi): Secondary | ICD-10-CM | POA: Diagnosis not present

## 2021-03-11 DIAGNOSIS — Z993 Dependence on wheelchair: Secondary | ICD-10-CM

## 2021-03-11 DIAGNOSIS — R4182 Altered mental status, unspecified: Secondary | ICD-10-CM | POA: Diagnosis not present

## 2021-03-11 DIAGNOSIS — J9811 Atelectasis: Secondary | ICD-10-CM | POA: Diagnosis not present

## 2021-03-11 DIAGNOSIS — I5022 Chronic systolic (congestive) heart failure: Secondary | ICD-10-CM | POA: Diagnosis not present

## 2021-03-11 DIAGNOSIS — R404 Transient alteration of awareness: Secondary | ICD-10-CM | POA: Diagnosis not present

## 2021-03-11 DIAGNOSIS — I4891 Unspecified atrial fibrillation: Secondary | ICD-10-CM | POA: Diagnosis not present

## 2021-03-11 DIAGNOSIS — G929 Unspecified toxic encephalopathy: Secondary | ICD-10-CM | POA: Diagnosis present

## 2021-03-11 DIAGNOSIS — I5032 Chronic diastolic (congestive) heart failure: Secondary | ICD-10-CM | POA: Diagnosis present

## 2021-03-11 DIAGNOSIS — N390 Urinary tract infection, site not specified: Secondary | ICD-10-CM | POA: Diagnosis not present

## 2021-03-11 DIAGNOSIS — I420 Dilated cardiomyopathy: Secondary | ICD-10-CM | POA: Diagnosis present

## 2021-03-11 DIAGNOSIS — I10 Essential (primary) hypertension: Secondary | ICD-10-CM | POA: Diagnosis present

## 2021-03-11 DIAGNOSIS — M109 Gout, unspecified: Secondary | ICD-10-CM | POA: Diagnosis present

## 2021-03-11 DIAGNOSIS — N179 Acute kidney failure, unspecified: Secondary | ICD-10-CM | POA: Diagnosis present

## 2021-03-11 DIAGNOSIS — Z7901 Long term (current) use of anticoagulants: Secondary | ICD-10-CM

## 2021-03-11 DIAGNOSIS — Z79899 Other long term (current) drug therapy: Secondary | ICD-10-CM

## 2021-03-11 DIAGNOSIS — Z7401 Bed confinement status: Secondary | ICD-10-CM

## 2021-03-11 DIAGNOSIS — R410 Disorientation, unspecified: Secondary | ICD-10-CM | POA: Diagnosis not present

## 2021-03-11 DIAGNOSIS — R531 Weakness: Secondary | ICD-10-CM | POA: Diagnosis not present

## 2021-03-11 DIAGNOSIS — Z95828 Presence of other vascular implants and grafts: Secondary | ICD-10-CM

## 2021-03-11 DIAGNOSIS — I4819 Other persistent atrial fibrillation: Secondary | ICD-10-CM | POA: Diagnosis present

## 2021-03-11 DIAGNOSIS — M4628 Osteomyelitis of vertebra, sacral and sacrococcygeal region: Secondary | ICD-10-CM | POA: Diagnosis not present

## 2021-03-11 DIAGNOSIS — A419 Sepsis, unspecified organism: Principal | ICD-10-CM

## 2021-03-11 DIAGNOSIS — N183 Chronic kidney disease, stage 3 unspecified: Secondary | ICD-10-CM | POA: Diagnosis present

## 2021-03-11 DIAGNOSIS — Z20822 Contact with and (suspected) exposure to covid-19: Secondary | ICD-10-CM | POA: Diagnosis present

## 2021-03-11 DIAGNOSIS — E872 Acidosis: Secondary | ICD-10-CM | POA: Diagnosis present

## 2021-03-11 DIAGNOSIS — R7303 Prediabetes: Secondary | ICD-10-CM | POA: Diagnosis present

## 2021-03-11 DIAGNOSIS — E860 Dehydration: Secondary | ICD-10-CM | POA: Diagnosis present

## 2021-03-11 DIAGNOSIS — R0602 Shortness of breath: Secondary | ICD-10-CM | POA: Diagnosis not present

## 2021-03-11 DIAGNOSIS — J069 Acute upper respiratory infection, unspecified: Secondary | ICD-10-CM | POA: Diagnosis not present

## 2021-03-11 DIAGNOSIS — Z66 Do not resuscitate: Secondary | ICD-10-CM | POA: Diagnosis present

## 2021-03-11 DIAGNOSIS — I517 Cardiomegaly: Secondary | ICD-10-CM | POA: Diagnosis not present

## 2021-03-11 DIAGNOSIS — Z87440 Personal history of urinary (tract) infections: Secondary | ICD-10-CM

## 2021-03-11 DIAGNOSIS — Z806 Family history of leukemia: Secondary | ICD-10-CM | POA: Diagnosis not present

## 2021-03-11 DIAGNOSIS — M10271 Drug-induced gout, right ankle and foot: Secondary | ICD-10-CM | POA: Diagnosis not present

## 2021-03-11 DIAGNOSIS — Z03818 Encounter for observation for suspected exposure to other biological agents ruled out: Secondary | ICD-10-CM | POA: Diagnosis not present

## 2021-03-11 HISTORY — DX: Chronic kidney disease, unspecified: N18.9

## 2021-03-11 LAB — APTT: aPTT: 33 seconds (ref 24–36)

## 2021-03-11 LAB — COMPREHENSIVE METABOLIC PANEL
ALT: 8 U/L (ref 0–44)
AST: 13 U/L — ABNORMAL LOW (ref 15–41)
Albumin: 2.6 g/dL — ABNORMAL LOW (ref 3.5–5.0)
Alkaline Phosphatase: 53 U/L (ref 38–126)
Anion gap: 10 (ref 5–15)
BUN: 21 mg/dL (ref 8–23)
CO2: 28 mmol/L (ref 22–32)
Calcium: 8.9 mg/dL (ref 8.9–10.3)
Chloride: 101 mmol/L (ref 98–111)
Creatinine, Ser: 1.28 mg/dL — ABNORMAL HIGH (ref 0.61–1.24)
GFR, Estimated: >60 mL/min (ref >60)
Glucose, Bld: 172 mg/dL — ABNORMAL HIGH (ref 70–99)
Potassium: 4.5 mmol/L (ref 3.5–5.1)
Sodium: 139 mmol/L (ref 135–145)
Total Bilirubin: 0.6 mg/dL (ref 0.3–1.2)
Total Protein: 6.3 g/dL — ABNORMAL LOW (ref 6.5–8.1)

## 2021-03-11 LAB — CBC WITH DIFFERENTIAL/PLATELET
Abs Immature Granulocytes: 0.03 10*3/uL (ref 0.00–0.07)
Basophils Absolute: 0 10*3/uL (ref 0.0–0.1)
Basophils Relative: 0 %
Eosinophils Absolute: 0 10*3/uL (ref 0.0–0.5)
Eosinophils Relative: 0 %
HCT: 38.8 % — ABNORMAL LOW (ref 39.0–52.0)
Hemoglobin: 12.3 g/dL — ABNORMAL LOW (ref 13.0–17.0)
Immature Granulocytes: 0 %
Lymphocytes Relative: 14 %
Lymphs Abs: 1.6 10*3/uL (ref 0.7–4.0)
MCH: 26.7 pg (ref 26.0–34.0)
MCHC: 31.7 g/dL (ref 30.0–36.0)
MCV: 84.2 fL (ref 80.0–100.0)
Monocytes Absolute: 1.1 10*3/uL — ABNORMAL HIGH (ref 0.1–1.0)
Monocytes Relative: 9 %
Neutro Abs: 8.5 10*3/uL — ABNORMAL HIGH (ref 1.7–7.7)
Neutrophils Relative %: 77 %
Platelets: 283 10*3/uL (ref 150–400)
RBC: 4.61 MIL/uL (ref 4.22–5.81)
RDW: 19.8 % — ABNORMAL HIGH (ref 11.5–15.5)
WBC: 11.2 10*3/uL — ABNORMAL HIGH (ref 4.0–10.5)
nRBC: 0 % (ref 0.0–0.2)

## 2021-03-11 LAB — URINALYSIS, ROUTINE W REFLEX MICROSCOPIC
Bilirubin Urine: NEGATIVE
Glucose, UA: NEGATIVE mg/dL
Ketones, ur: NEGATIVE mg/dL
Nitrite: NEGATIVE
Protein, ur: 30 mg/dL — AB
RBC / HPF: >50 RBC/hpf — ABNORMAL HIGH (ref 0–5)
Specific Gravity, Urine: 1.011 (ref 1.005–1.030)
WBC, UA: >50 WBC/hpf — ABNORMAL HIGH (ref 0–5)
pH: 5 (ref 5.0–8.0)

## 2021-03-11 LAB — PROTIME-INR
INR: 1.7 — ABNORMAL HIGH (ref 0.8–1.2)
Prothrombin Time: 19.8 seconds — ABNORMAL HIGH (ref 11.4–15.2)

## 2021-03-11 LAB — SEDIMENTATION RATE: Sed Rate: 80 mm/hr — ABNORMAL HIGH (ref 0–16)

## 2021-03-11 LAB — VALPROIC ACID LEVEL: Valproic Acid Lvl: 23 ug/mL — ABNORMAL LOW (ref 50.0–100.0)

## 2021-03-11 LAB — LACTIC ACID, PLASMA: Lactic Acid, Venous: 2.6 mmol/L (ref 0.5–1.9)

## 2021-03-11 LAB — RESP PANEL BY RT-PCR (FLU A&B, COVID) ARPGX2
Influenza A by PCR: NEGATIVE
Influenza B by PCR: NEGATIVE
SARS Coronavirus 2 by RT PCR: NEGATIVE

## 2021-03-11 MED ORDER — DIVALPROEX SODIUM 250 MG PO DR TAB
250.0000 mg | DELAYED_RELEASE_TABLET | Freq: Two times a day (BID) | ORAL | Status: DC
  Administered 2021-03-11 – 2021-03-16 (×10): 250 mg via ORAL
  Filled 2021-03-11 (×10): qty 1

## 2021-03-11 MED ORDER — LACTATED RINGERS IV BOLUS
1000.0000 mL | Freq: Once | INTRAVENOUS | Status: AC
  Administered 2021-03-11: 1000 mL via INTRAVENOUS

## 2021-03-11 MED ORDER — PANTOPRAZOLE SODIUM 40 MG PO TBEC
40.0000 mg | DELAYED_RELEASE_TABLET | Freq: Every day | ORAL | Status: DC
  Administered 2021-03-12 – 2021-03-16 (×5): 40 mg via ORAL
  Filled 2021-03-11 (×5): qty 1

## 2021-03-11 MED ORDER — SODIUM CHLORIDE 0.9% FLUSH: 10.0000 mL | INTRAVENOUS | Status: DC | PRN

## 2021-03-11 MED ORDER — METRONIDAZOLE 500 MG/100ML IV SOLN
500.0000 mg | Freq: Three times a day (TID) | INTRAVENOUS | Status: DC
  Administered 2021-03-11 – 2021-03-14 (×9): 500 mg via INTRAVENOUS
  Filled 2021-03-11 (×9): qty 100

## 2021-03-11 MED ORDER — DILTIAZEM LOAD VIA INFUSION
10.0000 mg | Freq: Once | INTRAVENOUS | Status: AC
  Administered 2021-03-11: 10 mg via INTRAVENOUS
  Filled 2021-03-11: qty 10

## 2021-03-11 MED ORDER — DILTIAZEM HCL-DEXTROSE 125-5 MG/125ML-% IV SOLN (PREMIX)
5.0000 mg/h | INTRAVENOUS | Status: DC
  Administered 2021-03-11: 15 mg/h via INTRAVENOUS
  Administered 2021-03-11: 5 mg/h via INTRAVENOUS
  Administered 2021-03-12: 15 mg/h via INTRAVENOUS
  Filled 2021-03-11 (×4): qty 125

## 2021-03-11 MED ORDER — SODIUM CHLORIDE 0.9 % IV SOLN
2.0000 g | Freq: Three times a day (TID) | INTRAVENOUS | Status: DC
  Administered 2021-03-11 – 2021-03-14 (×9): 2 g via INTRAVENOUS
  Filled 2021-03-11 (×9): qty 2

## 2021-03-11 MED ORDER — TRAMADOL HCL 50 MG PO TABS
25.0000 mg | ORAL_TABLET | Freq: Two times a day (BID) | ORAL | Status: DC
  Administered 2021-03-11 – 2021-03-16 (×10): 25 mg via ORAL
  Filled 2021-03-11 (×10): qty 1

## 2021-03-11 MED ORDER — APIXABAN 5 MG PO TABS
5.0000 mg | ORAL_TABLET | Freq: Two times a day (BID) | ORAL | Status: DC
  Administered 2021-03-11 – 2021-03-16 (×10): 5 mg via ORAL
  Filled 2021-03-11 (×11): qty 1

## 2021-03-11 MED ORDER — TAMSULOSIN HCL 0.4 MG PO CAPS
0.4000 mg | ORAL_CAPSULE | Freq: Every day | ORAL | Status: DC
  Administered 2021-03-11 – 2021-03-15 (×5): 0.4 mg via ORAL
  Filled 2021-03-11 (×5): qty 1

## 2021-03-11 MED ORDER — SODIUM CHLORIDE 0.9% FLUSH
3.0000 mL | Freq: Two times a day (BID) | INTRAVENOUS | Status: DC
  Administered 2021-03-11: 3 mL via INTRAVENOUS

## 2021-03-11 MED ORDER — ENSURE ENLIVE PO LIQD
237.0000 mL | Freq: Two times a day (BID) | ORAL | Status: DC
  Administered 2021-03-12 – 2021-03-15 (×7): 237 mL via ORAL

## 2021-03-11 MED ORDER — VANCOMYCIN HCL 1750 MG/350ML IV SOLN
1750.0000 mg | Freq: Once | INTRAVENOUS | Status: AC
  Administered 2021-03-11: 1750 mg via INTRAVENOUS
  Filled 2021-03-11: qty 350

## 2021-03-11 MED ORDER — ATORVASTATIN CALCIUM 10 MG PO TABS
20.0000 mg | ORAL_TABLET | Freq: Every evening | ORAL | Status: DC
  Administered 2021-03-11 – 2021-03-15 (×5): 20 mg via ORAL
  Filled 2021-03-11 (×5): qty 2

## 2021-03-11 MED ORDER — PROSOURCE PLUS PO LIQD
30.0000 mL | Freq: Two times a day (BID) | ORAL | Status: DC
  Administered 2021-03-11 – 2021-03-16 (×7): 30 mL via ORAL
  Filled 2021-03-11 (×9): qty 30

## 2021-03-11 MED ORDER — CHLORHEXIDINE GLUCONATE CLOTH 2 % EX PADS
6.0000 | MEDICATED_PAD | Freq: Every day | CUTANEOUS | Status: DC
  Administered 2021-03-12 – 2021-03-16 (×5): 6 via TOPICAL

## 2021-03-11 MED ORDER — SODIUM CHLORIDE 0.9 % IV SOLN: 250.0000 mL | INTRAVENOUS | Status: DC | PRN

## 2021-03-11 MED ORDER — LACTATED RINGERS IV SOLN: INTRAVENOUS | Status: DC

## 2021-03-11 MED ORDER — FUROSEMIDE 20 MG PO TABS
20.0000 mg | ORAL_TABLET | Freq: Every day | ORAL | Status: DC
  Administered 2021-03-12: 20 mg via ORAL
  Filled 2021-03-11: qty 1

## 2021-03-11 MED ORDER — ASCORBIC ACID 500 MG PO TABS
500.0000 mg | ORAL_TABLET | Freq: Two times a day (BID) | ORAL | Status: DC
  Administered 2021-03-11 – 2021-03-16 (×10): 500 mg via ORAL
  Filled 2021-03-11 (×10): qty 1

## 2021-03-11 MED ORDER — MIDODRINE HCL 5 MG PO TABS: 5.0000 mg | ORAL_TABLET | Freq: Three times a day (TID) | ORAL | Status: DC

## 2021-03-11 MED ORDER — VANCOMYCIN HCL 1000 MG/200ML IV SOLN
1000.0000 mg | Freq: Two times a day (BID) | INTRAVENOUS | Status: DC
  Administered 2021-03-12 – 2021-03-14 (×5): 1000 mg via INTRAVENOUS
  Filled 2021-03-11 (×6): qty 200

## 2021-03-11 MED ORDER — SODIUM CHLORIDE 0.9% FLUSH: 3.0000 mL | INTRAVENOUS | Status: DC | PRN

## 2021-03-11 MED ORDER — ACETAMINOPHEN 500 MG PO TABS
1000.0000 mg | ORAL_TABLET | Freq: Once | ORAL | Status: AC
  Administered 2021-03-11: 1000 mg via ORAL
  Filled 2021-03-11: qty 2

## 2021-03-11 MED ORDER — ALLOPURINOL 300 MG PO TABS
150.0000 mg | ORAL_TABLET | Freq: Every day | ORAL | Status: DC
  Administered 2021-03-12 – 2021-03-16 (×5): 150 mg via ORAL
  Filled 2021-03-11 (×5): qty 1

## 2021-03-11 MED ORDER — LACTATED RINGERS IV BOLUS
250.0000 mL | Freq: Once | INTRAVENOUS | Status: AC
  Administered 2021-03-11: 250 mL via INTRAVENOUS

## 2021-03-11 MED ORDER — SODIUM CHLORIDE 0.9 % IV SOLN: 2.0000 g | Freq: Three times a day (TID) | INTRAVENOUS | Status: DC

## 2021-03-11 MED ORDER — FERROUS SULFATE 325 (65 FE) MG PO TABS
325.0000 mg | ORAL_TABLET | Freq: Two times a day (BID) | ORAL | Status: DC
  Administered 2021-03-12 – 2021-03-16 (×9): 325 mg via ORAL
  Filled 2021-03-11 (×9): qty 1

## 2021-03-11 NOTE — ED Triage Notes (Signed)
Pt comes [facility] via EMS. Staff called out as pt was not acting himself, normally conversant but confused. Pt non-verbal, moans only with EMS. Last know normal 2200 last night. Pt tachy in 110's with EMS and temp 99.9. Pt has PICC left arm. Pt has sacral wound that he has been getting antibiotics for, last dose 2200 last night. BCL 133 BP 140/90 HR 110's RR 24 O2 98% on RA

## 2021-03-11 NOTE — ED Provider Notes (Signed)
[facility] EMERGENCY DEPARTMENT Provider Note   CSN: [id number] Arrival date & time: 03/11/21  1304     History Chief Complaint  Patient presents with   Altered Mental Status    Jeffery Christian is a 70 y.o. male.   Altered Mental Status Level 5 caveat due to altered mental status. Brought in from nursing home with mental status change.  Reportedly had been normal last night.  Is on IV antibiotics of ceftriaxone through PICC line for sacral osteomyelitis.  Also on oral Flagyl.  Reportedly normal movement change more awake and he is now.  Now is awake and talking but mostly quiet and appears somewhat confused.  EMS showed temperature of 99.9.  Appears to be moving his extremities but is confused.    Past Medical History:  Diagnosis Date   Chronic systolic CHF (congestive heart failure) (HCC)    EF 35-40, diffuse HK, mild MR, moderate LAE, mild RAE, PASP 44, L pleural eff   Dilated cardiomyopathy (HCC)    likely related to tachycardia   Persistent atrial fibrillation [facility])     Patient Active Problem List   Diagnosis Date Noted   Asymmetric edema of both lower extremities 02/22/2021   Decubitus ulcer of buttock 01/26/2021   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   Normochromic normocytic anemia 12/14/2020   Urinary retention 11/18/2020   Neurocognitive deficits 11/18/2020   Acute renal failure superimposed on stage 3 chronic kidney disease (HCC) 11/09/2020   Acute metabolic encephalopathy 11/09/2020   Hypernatremia 11/09/2020   Elevated troponin 11/09/2020   COVID-19 virus infection 11/09/2020   Hypertension 01/07/2018   Atrial fibrillation with RVR (HCC) 08/05/2017   CKD (chronic kidney disease), stage III (HCC) 08/05/2017   Persistent atrial fibrillation (HCC)    Chronic systolic CHF (congestive heart failure) (HCC)    Dilated cardiomyopathy (HCC)    Shortness of breath    Gout 07/18/2017   Left buttock abscess 03/22/2014    Past Surgical History:   Procedure Laterality Date   CARDIOVERSION N/A 08/20/2017   Procedure: CARDIOVERSION;  Surgeon: [name], MD;  Location: [facility] ENDOSCOPY;  Service: Cardiovascular;  Laterality: N/A;   INCISION AND DRAINAGE ABSCESS Left 03/22/2014   Procedure: INCISION AND DRAINAGE ABSCESS LEFT BUTTOCK ABSCESS;  Surgeon: [name], MD;  Location: [facility] OR;  Service: General;  Laterality: Left;       Family History  Problem Relation Age of Onset   Leukemia Father        78   Cancer Mother     Social History   Tobacco Use   Smoking status: Never   Smokeless tobacco: Never  Substance Use Topics   Alcohol use: Not Currently    Comment: pt states he "does not drink a lot anymore"   Drug use: No    Home Medications Prior to Admission medications   Medication Sig Start Date End Date Taking? Authorizing Provider  acetaminophen (TYLENOL) 500 MG tablet Take 500 mg by mouth every 6 (six) hours as needed.    [provider]  acetaminophen (TYLENOL) 650 MG suppository Place 1 suppository (650 mg total) rectally every 6 (six) hours as needed for fever. 11/16/20   Elgergawy, Leana Roe, MD  allopurinol (ZYLOPRIM) 300 MG tablet Take 150 mg by mouth daily.    [provider]  Amino Acids-Protein Hydrolys (PRO-STAT PO) Take 30 mLs by mouth 2 (two) times daily.    [provider]  apixaban (ELIQUIS) 5 MG TABS tablet TAKE 1  TABLET (5 MG TOTAL) BY MOUTH 2 (TWO) TIMES DAILY. 06/08/20 06/08/21  Hoy Register, MD  atorvastatin (LIPITOR) 20 MG tablet TAKE 1 TABLET (20 MG TOTAL) BY MOUTH DAILY. 06/08/20 06/08/21  Hoy Register, MD  bisacodyl (DULCOLAX) 10 MG suppository Place 10 mg rectally as needed for moderate constipation.    [provider]  cefTRIAXone 2 g in dextrose 5 % 50 mL Inject 2 g into the vein daily. 01/27/21   Cliffton Asters, MD  colchicine 0.6 MG tablet Take by mouth. GIVE 2 CAPSULES=1.2 MG PO AT FIRST SIGN OF GOUT FLARE UP AND REPEAT 0.6 MG IN 1 HOUR IF SYMPTOMS  PERSIST  TAKE 1 CAPSULE BY MOUTH 1 HOUR AFTER TAKING 2 CAPSULE IF GOUT SYMPTOMS HAVE NOT SUBSIDED    [provider]  collagenase (SANTYL) ointment Apply topically. Irrigate right buttock with 1/2 strength Dakins solution. Pack with santyl, Dakins moistened gauze and apply secondary dressing daily    [provider]  diltiazem (CARDIZEM) 60 MG tablet Take 60 mg by mouth every 8 (eight) hours.    [provider]  divalproex (DEPAKOTE) 125 MG DR tablet Take 250 mg by mouth 2 (two) times daily.    [provider]  feeding supplement (ENSURE ENLIVE / ENSURE PLUS) LIQD Take 237 mLs by mouth 2 (two) times daily between meals. 11/16/20   Elgergawy, Leana Roe, MD  ferrous sulfate 325 (65 FE) MG tablet Take 325 mg by mouth 2 (two) times daily with a meal.    [provider]  Magnesium Hydroxide (MILK OF MAGNESIA PO) Take 30 mLs by mouth as needed.    [provider]  metroNIDAZOLE (FLAGYL) 500 MG tablet Take 1 tablet (500 mg total) by mouth 3 (three) times daily. 01/27/21   Cliffton Asters, MD  midodrine (PROAMATINE) 2.5 MG tablet Take 1 tablet (2.5 mg total) by mouth 3 (three) times daily with meals. 11/16/20   Elgergawy, Leana Roe, MD  neomycin-bacitracin-polymyxin (NEOSPORIN) OINT Apply 1 application topically 2 (two) times daily. Please apply to glans penis wound BID. 11/16/20   Elgergawy, Leana Roe, MD  NON FORMULARY Med pass 120 ml by mouth twice a day for wound healing    [provider]  pantoprazole (PROTONIX) 40 MG tablet Take 1 tablet (40 mg total) by mouth daily. 11/16/20   Elgergawy, Leana Roe, MD  polyethylene glycol (MIRALAX / GLYCOLAX) 17 g packet Take 17 g by mouth daily.    [provider]  Saccharomyces boulardii (PROBIOTIC) 250 MG CAPS Take 1 capsule by mouth 2 (two) times daily. F daysor 73    [provider]  Sodium Phosphates (RA SALINE ENEMA RE) Place rectally as needed.    [provider]  tamsulosin  (FLOMAX) 0.4 MG CAPS capsule Take 1 capsule (0.4 mg total) by mouth daily after supper. 11/16/20   Elgergawy, Leana Roe, MD  traMADol (ULTRAM) 50 MG tablet Give 1/2 tab = 25 mg by mouth BID 02/22/21   Medina-Vargas, Monina C, NP  vitamin C (ASCORBIC ACID) 500 MG tablet Take 500 mg by mouth 2 (two) times daily.    [provider]  furosemide (LASIX) 20 MG tablet Take 1 tablet (20 mg total) by mouth daily. 06/08/20 11/16/20  Hoy Register, MD  metoprolol succinate (TOPROL-XL) 25 MG 24 hr tablet Take 1 tablet (25 mg total) by mouth daily. Take with or immediately following a meal. Patient taking differently: Take 25 mg by mouth daily. Take with or immediately following a meal. LD  UNK time 06/08/20 11/16/20  Hoy Register, MD    Allergies    Patient has no known allergies.  Review of Systems   Review of Systems  Unable to perform ROS: Mental status change   Physical Exam Updated Vital Signs BP 127/88   Pulse (!) 144   Temp (!) 102.5 F (39.2 C) (Rectal)   Resp 19   Ht 6' (1.829 m)   Wt 87.5 kg   SpO2 100%   BMI 26.18 kg/m   Physical Exam Vitals reviewed.  HENT:     Head: Atraumatic.     Right Ear: External ear normal.     Left Ear: External ear normal.  Eyes:     General: No scleral icterus. Cardiovascular:     Rate and Rhythm: Tachycardia present.  Pulmonary:     Breath sounds: No wheezing or rhonchi.  Abdominal:     Tenderness: There is no abdominal tenderness.  Musculoskeletal:        General: No tenderness.  Skin:    General: Skin is warm.     Capillary Refill: Capillary refill takes less than 2 seconds.  Neurological:     Mental Status: He is alert.     Comments: Awake but confused.  Answers questions but cannot tell me his name.  Moving his extremities.  Sacral decubitus ulcer.  Deep but not foul-smelling.  No drainage.  Dressing removed and examined.  ED Results / Procedures / Treatments   Labs (all labs ordered are listed, but only abnormal results are  displayed) Labs Reviewed  COMPREHENSIVE METABOLIC PANEL - Abnormal; Notable for the following components:      Result Value   Glucose, Bld 172 (*)    Creatinine, Ser 1.28 (*)    Total Protein 6.3 (*)    Albumin 2.6 (*)    AST 13 (*)    All other components within normal limits  CBC WITH DIFFERENTIAL/PLATELET - Abnormal; Notable for the following components:   WBC 11.2 (*)    Hemoglobin 12.3 (*)    HCT 38.8 (*)    RDW 19.8 (*)    Neutro Abs 8.5 (*)    Monocytes Absolute 1.1 (*)    All other components within normal limits  URINALYSIS, ROUTINE W REFLEX MICROSCOPIC - Abnormal; Notable for the following components:   APPearance CLOUDY (*)    Hgb urine dipstick LARGE (*)    Protein, ur 30 (*)    Leukocytes,Ua LARGE (*)    RBC / HPF >50 (*)    WBC, UA >50 (*)    Bacteria, UA MANY (*)    All other components within normal limits  VALPROIC ACID LEVEL - Abnormal; Notable for the following components:   Valproic Acid Lvl 23 (*)    All other components within normal limits  PROTIME-INR - Abnormal; Notable for the following components:   Prothrombin Time 19.8 (*)    INR 1.7 (*)    All other components within normal limits  CULTURE, BLOOD (ROUTINE X 2)  CULTURE, BLOOD (ROUTINE X 2)  URINE CULTURE  RESP PANEL BY RT-PCR (FLU A&B, COVID) ARPGX2  APTT  LACTIC ACID, PLASMA  LACTIC ACID, PLASMA  SEDIMENTATION RATE  AMMONIA  C-REACTIVE PROTEIN    EKG EKG Interpretation  Date/Time:  Friday March 11 2021 14:02:10 EDT Ventricular Rate:  157 PR Interval:    QRS Duration: 70 QT Interval:  268 QTC Calculation: 433 R Axis:   -17 Text Interpretation: Atrial fibrillation with rapid ventricular response Low  voltage QRS Inferior infarct , age undetermined Cannot rule out Anterior infarct , age undetermined Abnormal ECG Confirmed by Benjiman CorePickering, Vivaan Helseth [phone]([id number]) on 03/11/2021 2:06:52 PM  Radiology DG Chest Port 1 View  Result Date: 03/11/2021 CLINICAL DATA:  Question sepsis.  Altered mental  status. EXAM: PORTABLE CHEST 1 VIEW COMPARISON:  11/11/2020 FINDINGS: Cardiomegaly. Aortic atherosclerosis and tortuosity. Pulmonary vascularity is normal. The right lung is clear. Question mild atelectasis or infiltrate at the medial left base. Suggest lateral chest radiography when able. No effusion. IMPRESSION: Cardiomegaly and aortic atherosclerosis. Question mild volume loss or infiltrate at the medial left base. This could be better evaluated with lateral chest radiography when able. Electronically Signed   By: Paulina FusiMark  Shogry M.D.   On: 03/11/2021 15:10    Procedures Procedures   Medications Ordered in ED Medications  lactated ringers infusion ( Intravenous New Bag/Given 03/11/21 1509)  metroNIDAZOLE (FLAGYL) IVPB 500 mg (500 mg Intravenous New Bag/Given 03/11/21 1510)  ceFEPIme (MAXIPIME) 2 g in sodium chloride 0.9 % 100 mL IVPB (2 g Intravenous New Bag/Given 03/11/21 1538)  vancomycin (VANCOREADY) IVPB 1750 mg/350 mL (has no administration in time range)  lactated ringers bolus 1,000 mL ( Intravenous Stopped 03/11/21 1528)  acetaminophen (TYLENOL) tablet 1,000 mg (1,000 mg Oral Given 03/11/21 1545)    ED Course  I have reviewed the triage vital signs and the nursing notes.  Pertinent labs & imaging results that were available during my care of the patient were reviewed by me and considered in my medical decision making (see chart for details).    MDM Rules/Calculators/A&P                         Patient brought in for mental status change.  Currently on Rocephin and Flagyl for sacral decubitus ulcer.  Brought in from nursing home.  Really cannot provide much history.  Will nod yes or no.  Appears to be moving bilateral extremities.  Patient was initially started in the hallway because there were no available rooms.  Initial temporal temperature was 99.9.  Once he got into room found to have temperature of 102 rectally and code sepsis activated.  Patient is not in A. fib with RVR.  However he  has not hypotensive.  We will get lab work and give small fluid bolus but not initially the 30/kg to least find more evidence this is a severe sepsis.  Discussed with pharmacy and they will dose acceptable antibiotics by culture and the antibiotics he is already on. Lactic acid still pending.  Chest x-ray overall reassuring but potentially could have infiltrate.  Urine shows likely infection.  Continues to be in A. fib with RVR.  Already on anticoagulation.  Not currently treating with rate reduction.  Will treat underlying disease and see if it will treat the rate.  CRITICAL CARE Performed by: Benjiman CoreNathan Avir Deruiter Total critical care time: 30 minutes Critical care time was exclusive of separately billable procedures and treating other patients. Critical care was necessary to treat or prevent imminent or life-threatening deterioration. Critical care was time spent personally by me on the following activities: development of treatment plan with patient and/or surrogate as well as nursing, discussions with consultants, evaluation of patient's response to treatment, examination of patient, obtaining history from patient or surrogate, ordering and performing treatments and interventions, ordering and review of laboratory studies, ordering and review of radiographic studies, pulse oximetry and re-evaluation of patient's condition.  Final Clinical Impression(s) / ED Diagnoses  Final diagnoses:  Sepsis, due to unspecified organism, unspecified whether acute organ dysfunction present [facility])  Urinary tract infection without hematuria, site unspecified  Atrial fibrillation with rapid ventricular response [facility])    Rx / DC Orders ED Discharge Orders     None        Benjiman Core, MD 03/11/21 1548

## 2021-03-11 NOTE — Progress Notes (Signed)
Pt admitted with PICC from SNF. Noted chest Xray was done earlier today. Discussed with Dr. Kearney Hard, radiologist: tip in SVC.

## 2021-03-11 NOTE — Sepsis Progress Note (Signed)
Notified provider of need to order additional  fluid bolus. 

## 2021-03-11 NOTE — ED Notes (Signed)
MD notified of pt bp of 79/68. Dr.Plunkett at bedside. 250cc bolus LR ordered. Will continue to monitor. MD is aware of HR ranging from 145-165bpm.

## 2021-03-11 NOTE — H&P (Signed)
History and Physical    Jeffery Christian IEP:[id number] DOB: Dec 15, 1950 DOA: 03/11/2021  PCP: Charlott Rakes, MD Consultants:  geriatric medicine: Dr. Linna Darner, Infectious disease: Dr. Megan Salon,  Patient coming from: SNF   Chief Complaint: AMS  HPI: Jeffery Christian is a 70 y.o. male with medical history significant of atrial fibrillation, CKD stage 3, HTN, prediabetes, gout, sacral osteomyelitis and decubitus ulcer followed by ID and currently on flagyl and rocephin x 6 week course (started in may) presented from ED secondary to Jeffery Christian. He is alone and has baseline AMS. He responds to some questions, but is not oriented to date. Knows he is in the hospital. Doesn't know where he lives or is coming from. Doesn't really answer my questions, but denies any pain, chest pain or palpitations. History difficult.  Was able to speak with his daughter who states he will sometimes talk and sometimes won't and seems to be intermittently confused. She states he has gotten frequent UTIs in the past.   ED Course: sepsis protocol intiated. Lactic acid elevated to 2.6. UA with findings concerning for UTI.  COVID-negative.  Mild AKI with a creatinine of 1.28, mild leukocytosis with WBC of 11.2.  Initial initially received 1 L of fluid.  Given Tylenol to treat fever, but continued to have persistent tachycardia and Cardizem drip and bolus were started. After drip had episodes of hypotension and IV bolus given again. blood pressure improved. Pan cultured, pharmacy consulted and he was changed to  broad coverage antibiotics in setting of sacral wound as well as possible UTI.  Review of Systems: As per HPI; otherwise review of systems reviewed and negative.   Ambulatory Status: unknown   Past Medical History:  Diagnosis Date   Chronic systolic CHF (congestive heart failure) (HCC)    EF 35-40, diffuse HK, mild MR, moderate LAE, mild RAE, PASP 44, L pleural eff   Dilated cardiomyopathy (HCC)    likely related to tachycardia    Persistent atrial fibrillation [facility])     Past Surgical History:  Procedure Laterality Date   CARDIOVERSION N/A 08/20/2017   Procedure: CARDIOVERSION;  Surgeon: Jerline Pain, MD;  Location: [facility];  Service: Cardiovascular;  Laterality: N/A;   INCISION AND DRAINAGE ABSCESS Left 03/22/2014   Procedure: INCISION AND DRAINAGE ABSCESS LEFT BUTTOCK ABSCESS;  Surgeon: Zenovia Jarred, MD;  Location: [facility] [place];  Service: General;  Laterality: Left;    Social History   Socioeconomic History   Marital status: Divorced    Spouse name: Not on file   Number of children: Not on file   Years of education: Not on file   Highest education level: Not on file  Occupational History   Not on file  Tobacco Use   Smoking status: Never   Smokeless tobacco: Never  Substance and Sexual Activity   Alcohol use: Not Currently    Comment: pt states he "does not drink a lot anymore"   Drug use: No   Sexual activity: Not on file  Other Topics Concern   Not on file  Social History Narrative   Not on file   Social Determinants of Health   Financial Resource Strain: Not on file  Food Insecurity: Not on file  Transportation Needs: Not on file  Physical Activity: Not on file  Stress: Not on file  Social Connections: Not on file  Intimate Partner Violence: Not on file    No Known Allergies  Family History  Problem Relation Age of Onset   Leukemia Father  56   Cancer Mother     Prior to Admission medications   Medication Sig Start Date End Date Taking? Authorizing Provider  acetaminophen (TYLENOL) 500 MG tablet Take 500 mg by mouth every 6 (six) hours as needed.    [provider]  acetaminophen (TYLENOL) 650 MG suppository Place 1 suppository (650 mg total) rectally every 6 (six) hours as needed for fever. 11/16/20   Elgergawy, Silver Huguenin, MD  allopurinol (ZYLOPRIM) 300 MG tablet Take 150 mg by mouth daily.    [provider]  Amino Acids-Protein Hydrolys (PRO-STAT PO)  Take 30 mLs by mouth 2 (two) times daily.    [provider]  apixaban (ELIQUIS) 5 MG TABS tablet TAKE 1 TABLET (5 MG TOTAL) BY MOUTH 2 (TWO) TIMES DAILY. 06/08/20 06/08/21  Charlott Rakes, MD  atorvastatin (LIPITOR) 20 MG tablet TAKE 1 TABLET (20 MG TOTAL) BY MOUTH DAILY. 06/08/20 06/08/21  Charlott Rakes, MD  bisacodyl (DULCOLAX) 10 MG suppository Place 10 mg rectally as needed for moderate constipation.    [provider]  cefTRIAXone 2 g in dextrose 5 % 50 mL Inject 2 g into the vein daily. 01/27/21   Michel Bickers, MD  colchicine 0.6 MG tablet Take by mouth. GIVE 2 CAPSULES=1.2 MG PO AT FIRST SIGN OF GOUT FLARE UP AND REPEAT 0.6 MG IN 1 HOUR IF SYMPTOMS PERSIST  TAKE 1 CAPSULE BY MOUTH 1 HOUR AFTER TAKING 2 CAPSULE IF GOUT SYMPTOMS HAVE NOT SUBSIDED    [provider]  collagenase (SANTYL) ointment Apply topically. Irrigate right buttock with 1/2 strength Dakins solution. Pack with santyl, Dakins moistened gauze and apply secondary dressing daily    [provider]  diltiazem (CARDIZEM) 60 MG tablet Take 60 mg by mouth every 8 (eight) hours.    [provider]  divalproex (DEPAKOTE) 125 MG DR tablet Take 250 mg by mouth 2 (two) times daily.    [provider]  feeding supplement (ENSURE ENLIVE / ENSURE PLUS) LIQD Take 237 mLs by mouth 2 (two) times daily between meals. 11/16/20   Elgergawy, Silver Huguenin, MD  ferrous sulfate 325 (65 FE) MG tablet Take 325 mg by mouth 2 (two) times daily with a meal.    [provider]  Magnesium Hydroxide (MILK OF MAGNESIA PO) Take 30 mLs by mouth as needed.    [provider]  metroNIDAZOLE (FLAGYL) 500 MG tablet Take 1 tablet (500 mg total) by mouth 3 (three) times daily. 01/27/21   Michel Bickers, MD  midodrine (PROAMATINE) 2.5 MG tablet Take 1 tablet (2.5 mg total) by mouth 3 (three) times daily with meals. 11/16/20   Elgergawy, Silver Huguenin, MD  neomycin-bacitracin-polymyxin (NEOSPORIN) OINT Apply 1  application topically 2 (two) times daily. Please apply to glans penis wound BID. 11/16/20   Elgergawy, Silver Huguenin, MD  NON FORMULARY Med pass 120 ml by mouth twice a day for wound healing    [provider]  pantoprazole (PROTONIX) 40 MG tablet Take 1 tablet (40 mg total) by mouth daily. 11/16/20   Elgergawy, Silver Huguenin, MD  polyethylene glycol (MIRALAX / GLYCOLAX) 17 g packet Take 17 g by mouth daily.    [provider]  Saccharomyces boulardii (PROBIOTIC) 250 MG CAPS Take 1 capsule by mouth 2 (two) times daily. F daysor 16    [provider]  Sodium Phosphates (RA SALINE ENEMA RE) Place rectally as needed.    [provider]  tamsulosin (FLOMAX) 0.4 MG CAPS capsule Take 1 capsule (  0.4 mg total) by mouth daily after supper. 11/16/20   Elgergawy, Silver Huguenin, MD  traMADol (ULTRAM) 50 MG tablet Give 1/2 tab = 25 mg by mouth BID 02/22/21   Medina-Vargas, Monina C, NP  vitamin C (ASCORBIC ACID) 500 MG tablet Take 500 mg by mouth 2 (two) times daily.    [provider]  furosemide (LASIX) 20 MG tablet Take 1 tablet (20 mg total) by mouth daily. 06/08/20 11/16/20  Charlott Rakes, MD  metoprolol succinate (TOPROL-XL) 25 MG 24 hr tablet Take 1 tablet (25 mg total) by mouth daily. Take with or immediately following a meal. Patient taking differently: Take 25 mg by mouth daily. Take with or immediately following a meal. LD UNK time 06/08/20 11/16/20  Charlott Rakes, MD    Physical Exam: Vitals:   03/11/21 1745 03/11/21 1815 03/11/21 1845 03/11/21 1900  BP: 113/73 108/75 104/62 101/61  Pulse: (!) 148 (!) 128 (!) 106 (!) 109  Resp: 16 18 (!) 0 17  Temp:      TempSrc:      SpO2: 100% 99% 100% 100%  Weight:      Height:         General:  Appears calm and comfortable and is in NAD. Mittens placed on hands.  Eyes:  PERRL, EOMI, normal lids, iris ENT:  grossly normal hearing, lips & tongue, mmm; appropriate dentition Neck:  no LAD, masses or thyromegaly; no carotid  bruits Cardiovascular:  irregulary, irregular. no m/r/g. Bilateral pitting edema in bilateral ankles/foot. 3+ Respiratory:   CTA bilaterally with no wheezes/rales/rhonchi.  Normal respiratory effort. Abdomen:  soft, NT, ND, NABS Back:   normal alignment, no CVAT Skin:  right sacral ulcer with packing. No surrounding erythema. About 2+cm in diameter. Could not probe this.  Musculoskeletal:  grossly normal tone BUE/BLE, good ROM, no bony abnormality Lower extremity:  bilateral pitting edema to ankles, 3+ Limited foot exam with no ulcerations.  2+ distal pulses. Psychiatric:  at baseline of confusion. Oriented to place, but not date/time. Does not know where he lives.  Neurologic:  CN 2-12 grossly intact, limited exam as he does not follow all commands.     Radiological Exams on Admission: Independently reviewed - see discussion in A/P where applicable  DG Chest 2 View  Result Date: 03/11/2021 CLINICAL DATA:  Possible sepsis EXAM: CHEST - 2 VIEW COMPARISON:  03/11/2021 FINDINGS: Cardiomegaly. Low lung volumes with bibasilar atelectasis. No confluent opacities, effusions or edema. IMPRESSION: Low lung volumes with bibasilar atelectasis. Cardiomegaly. Electronically Signed   By: Rolm Baptise M.D.   On: 03/11/2021 20:02   DG Chest Port 1 View  Result Date: 03/11/2021 CLINICAL DATA:  Question sepsis.  Altered mental status. EXAM: PORTABLE CHEST 1 VIEW COMPARISON:  11/11/2020 FINDINGS: Cardiomegaly. Aortic atherosclerosis and tortuosity. Pulmonary vascularity is normal. The right lung is clear. Question mild atelectasis or infiltrate at the medial left base. Suggest lateral chest radiography when able. No effusion. IMPRESSION: Cardiomegaly and aortic atherosclerosis. Question mild volume loss or infiltrate at the medial left base. This could be better evaluated with lateral chest radiography when able. Electronically Signed   By: Nelson Chimes M.D.   On: 03/11/2021 15:10    EKG: Independently  reviewed.  Afib with RVR; nonspecific ST changes with no evidence of acute ischemia   Labs on Admission: I have personally reviewed the available labs and imaging studies at the time of the admission.  Pertinent labs:  Covid negative Creatinine: 1.28 (baseline .89-1.0) Glucose 172 Lactic  acid: 2.6 Wbc: 11.2 ESR: 80  Assessment/Plan Sepsis secondary to UTI  -Sepsis protocol initiated in ER received 1 L bolus and an additional 250 mL bolus.  -IVF at 150cc/hr -Pancultured urine culture pending. -Consulted and broad-spectrum antibiotics initiated with Vanc, cefepime and Flagyl -Has other process possible sources of sepsis with sacral osteomyelitis and open sacral wound    Atrial fibrillation with RVR  -On Cardizem drip -Placed on telemetry -Hopefully can resume his p.o. Cardizem tomorrow -Continue home Eliquis -TSH pending    Chronic systolic CHF (congestive heart failure)  -Daily weights, strict I/O -BN P pending -Echocardiogram in February 2022-EF of 60 to 65% with normal left ventricular function.  Left ventricular diastolic parameters are indeterminate.  Left atrial size mildly dilated.  No aortic stenosis.  -We will watch fluid status with fluid resuscitation -holding lasix with hypotension     CKD (chronic kidney disease), stage III   -Slight bump in baseline IV fluids and recheck in a.m.  Hypertension -On midodrine $RemoveBefor'5mg'[id number]$  3 times daily per OV note in 4/22.  -Holding in light of hypotension and sepsis  Sacral osteomyelitis Seen by infectious disease on outpatient basis -Rocephin and flagyl  IV antibiotics looks like they were started in May x6-week course -Wound does not look infected on exam however limited. Still possible source of sepsis.  -From pharmacy consulted and he will be put on thank cefepime and Flagyl -Consulted infectious disease, call in AM.  -wound care consulted.     Prediabetes -A1c pending    Gout -Continue home colchicine  AMS At baseline.   Fall precautions    Body mass index is 26.18 kg/m.   Level of care: Progressive DVT prophylaxis: anticoagulated on eliquis.   Code Status:  Full - from records from his previous doctor visits in epic.  Family Communication: None present; I spoke with the patient's daughter at the time of admission. Haven Gelardi: [phone] Disposition Plan:  The patient is from: SNF   Patient is currently: acutely ill Consults called:  ordered ID, call in AM Admission status:  inpatient    Orma Flaming MD [facility]   How to contact the [facility] Attending or Consulting provider [place] or covering provider during after hours [place], for this patient?  Check the care team in [facility] and look for a) attending/consulting [facility] provider listed and b) the [facility] team listed Log into [email or web address] and use [place]'s universal password to access. If you do not have the password, please contact the hospital operator. Locate the [facility] provider you are looking for under [facility] and page to a number that you can be directly reached. If you still have difficulty reaching the provider, please page the [facility] (Director on Call) for the Hospitalists listed on amion for assistance.   03/11/2021, 9:44 PM

## 2021-03-11 NOTE — ED Notes (Signed)
MD notified of pt HR of 135-155bpm.

## 2021-03-11 NOTE — ED Provider Notes (Signed)
Assumed care of patient at 3:30 PM.  Patient with sepsis criteria with fever and persistent tachycardia.  Patient's lactate is elevated at 2.6, UA with findings concerning for UTI with greater than 50 white, red cells with large leukocytes and many bacteria.  COVID is negative.  Patient's sacral wound is well-appearing.  He does have mild AKI with creatinine 1.28 which is above his baseline.  Mild leukocytosis of 11.2.  Patient initially received 1 L of fluid due to his history of heart failure.  On repeat evaluation patient was given Tylenol because he still had A. fib RVR.  Also unclear if he had any of his medications today.  Patient then had improvement of his fever but because he was still persistently tachycardic a Cardizem drip and bolus was started.  Patient's blood pressure was normal prior to this.  After Cardizem bolus and starting the drip patient did have episodes of hypotension.  This did improve with an IV fluid bolus and he is now on 15 mg of Cardizem and heart rate has improved to 115-120s.  Patient will need admission for further care.  Antibiotics had already been given.  He is on a rate of 150 which should finish up his 30 mL/kg.  He is now more interactive and starting to pull off his lines so mittens were placed. CRITICAL CARE Performed by: Travus Oren Total critical care time: 30 minutes Critical care time was exclusive of separately billable procedures and treating other patients. Critical care was necessary to treat or prevent imminent or life-threatening deterioration. Critical care was time spent personally by me on the following activities: development of treatment plan with patient and/or surrogate as well as nursing, discussions with consultants, evaluation of patient's response to treatment, examination of patient, obtaining history from patient or surrogate, ordering and performing treatments and interventions, ordering and review of laboratory studies, ordering and  review of radiographic studies, pulse oximetry and re-evaluation of patient's condition.    Gwyneth Sprout, MD 03/11/21 [phone]

## 2021-03-11 NOTE — ED Notes (Signed)
Dr.Plunkett notified of lactic acid 2.6.

## 2021-03-11 NOTE — Progress Notes (Signed)
Pharmacy Antibiotic Note  Jeffery Christian is a 70 y.o. male admitted on 03/11/2021 with  wound infection/osteomyelitis .  Pharmacy has been consulted for Cefepime and vancomycin dosing.  WBC 11.2, SCr 1.28  Plan: -Cefepime 2 gm IV Q 8 hours -Vancomycin 1750 mg IV load followed by vancomycin 1 gm IV Q 12 hours -Monitor CBC, renal fx, cultures and clinical progress -Vanc levels as indicated  -Continuing metronidazole for anaerobic coverage   Height: 6' (182.9 cm) Weight: 87.5 kg (193 lb) IBW/kg (Calculated) : 77.6  Temp (24hrs), Avg:101.2 F (38.4 C), Min:99.9 F (37.7 C), Max:102.5 F (39.2 C)  No results for input(s): WBC, CREATININE, LATICACIDVEN, VANCOTROUGH, VANCOPEAK, VANCORANDOM, GENTTROUGH, GENTPEAK, GENTRANDOM, TOBRATROUGH, TOBRAPEAK, TOBRARND, AMIKACINPEAK, AMIKACINTROU, AMIKACIN in the last 168 hours.  CrCl cannot be calculated (Patient's most recent lab result is older than the maximum 21 days allowed.).    No Known Allergies  Antimicrobials this admission: Cefepime 6/10 >>  Vancomycin 6/10 >>  Metronidazole 6/10 >>   Dose adjustments this admission:   Microbiology results: 6/10 BCx:  6/10 UCx:     Thank you for allowing pharmacy to be a part of this patient's care.  Vinnie Level, PharmD., BCPS, BCCCP Clinical Pharmacist Please refer to [facility] for unit-specific pharmacist

## 2021-03-11 NOTE — ED Notes (Signed)
Mittens applied to both hands. Pt removed the EKG leads twice at this time. Pt is now talking and is able to answer questions. Will continue to monitor.

## 2021-03-11 NOTE — Sepsis Progress Note (Signed)
eLink is monitoring this Code Sepsis. °

## 2021-03-12 ENCOUNTER — Encounter (HOSPITAL_COMMUNITY): Payer: Self-pay | Admitting: Family Medicine

## 2021-03-12 ENCOUNTER — Other Ambulatory Visit: Payer: Self-pay

## 2021-03-12 ENCOUNTER — Inpatient Hospital Stay (HOSPITAL_COMMUNITY): Payer: Medicare Other

## 2021-03-12 DIAGNOSIS — I4891 Unspecified atrial fibrillation: Secondary | ICD-10-CM

## 2021-03-12 DIAGNOSIS — N390 Urinary tract infection, site not specified: Secondary | ICD-10-CM

## 2021-03-12 DIAGNOSIS — A419 Sepsis, unspecified organism: Principal | ICD-10-CM

## 2021-03-12 DIAGNOSIS — M4628 Osteomyelitis of vertebra, sacral and sacrococcygeal region: Secondary | ICD-10-CM

## 2021-03-12 DIAGNOSIS — I5022 Chronic systolic (congestive) heart failure: Secondary | ICD-10-CM

## 2021-03-12 DIAGNOSIS — N183 Chronic kidney disease, stage 3 unspecified: Secondary | ICD-10-CM

## 2021-03-12 DIAGNOSIS — M1A09X0 Idiopathic chronic gout, multiple sites, without tophus (tophi): Secondary | ICD-10-CM

## 2021-03-12 LAB — CBC WITH DIFFERENTIAL/PLATELET
Abs Immature Granulocytes: 0.05 10*3/uL (ref 0.00–0.07)
Basophils Absolute: 0 10*3/uL (ref 0.0–0.1)
Basophils Relative: 0 %
Eosinophils Absolute: 0 10*3/uL (ref 0.0–0.5)
Eosinophils Relative: 0 %
HCT: 33.3 % — ABNORMAL LOW (ref 39.0–52.0)
Hemoglobin: 10.4 g/dL — ABNORMAL LOW (ref 13.0–17.0)
Immature Granulocytes: 1 %
Lymphocytes Relative: 5 %
Lymphs Abs: 0.5 10*3/uL — ABNORMAL LOW (ref 0.7–4.0)
MCH: 26.3 pg (ref 26.0–34.0)
MCHC: 31.2 g/dL (ref 30.0–36.0)
MCV: 84.1 fL (ref 80.0–100.0)
Monocytes Absolute: 1 10*3/uL (ref 0.1–1.0)
Monocytes Relative: 11 %
Neutro Abs: 8 10*3/uL — ABNORMAL HIGH (ref 1.7–7.7)
Neutrophils Relative %: 83 %
Platelets: 224 10*3/uL (ref 150–400)
RBC: 3.96 MIL/uL — ABNORMAL LOW (ref 4.22–5.81)
RDW: 19.4 % — ABNORMAL HIGH (ref 11.5–15.5)
WBC: 9.6 10*3/uL (ref 4.0–10.5)
nRBC: 0 % (ref 0.0–0.2)

## 2021-03-12 LAB — URINE CULTURE: Culture: <10000 — AB

## 2021-03-12 LAB — TSH: TSH: 6.343 u[IU]/mL — ABNORMAL HIGH (ref 0.350–4.500)

## 2021-03-12 LAB — BRAIN NATRIURETIC PEPTIDE: B Natriuretic Peptide: 44.4 pg/mL (ref 0.0–100.0)

## 2021-03-12 LAB — PROCALCITONIN: Procalcitonin: 0.26 ng/mL

## 2021-03-12 LAB — C-REACTIVE PROTEIN: CRP: 14.8 mg/dL — ABNORMAL HIGH (ref <1.0)

## 2021-03-12 LAB — LACTIC ACID, PLASMA: Lactic Acid, Venous: 1.3 mmol/L (ref 0.5–1.9)

## 2021-03-12 LAB — BASIC METABOLIC PANEL
Anion gap: 10 (ref 5–15)
BUN: 20 mg/dL (ref 8–23)
CO2: 25 mmol/L (ref 22–32)
Calcium: 8.5 mg/dL — ABNORMAL LOW (ref 8.9–10.3)
Chloride: 101 mmol/L (ref 98–111)
Creatinine, Ser: 1.13 mg/dL (ref 0.61–1.24)
GFR, Estimated: >60 mL/min (ref >60)
Glucose, Bld: 256 mg/dL — ABNORMAL HIGH (ref 70–99)
Potassium: 4.1 mmol/L (ref 3.5–5.1)
Sodium: 136 mmol/L (ref 135–145)

## 2021-03-12 LAB — MRSA PCR SCREENING: MRSA by PCR: NEGATIVE

## 2021-03-12 LAB — AMMONIA: Ammonia: 10 umol/L (ref 9–35)

## 2021-03-12 MED ORDER — MAGNESIUM SULFATE 2 GM/50ML IV SOLN
2.0000 g | Freq: Once | INTRAVENOUS | Status: AC
  Administered 2021-03-12: 2 g via INTRAVENOUS
  Filled 2021-03-12: qty 50

## 2021-03-12 MED ORDER — DILTIAZEM HCL 60 MG PO TABS
60.0000 mg | ORAL_TABLET | Freq: Three times a day (TID) | ORAL | Status: DC
  Administered 2021-03-12 – 2021-03-16 (×12): 60 mg via ORAL
  Filled 2021-03-12 (×12): qty 1

## 2021-03-12 MED ORDER — LACTATED RINGERS IV BOLUS
1000.0000 mL | Freq: Once | INTRAVENOUS | Status: AC
  Administered 2021-03-12: 1000 mL via INTRAVENOUS

## 2021-03-12 MED ORDER — LACTATED RINGERS IV SOLN: INTRAVENOUS | Status: AC

## 2021-03-12 MED ORDER — MIDODRINE HCL 5 MG PO TABS
5.0000 mg | ORAL_TABLET | Freq: Three times a day (TID) | ORAL | Status: DC
  Administered 2021-03-12 – 2021-03-15 (×9): 5 mg via ORAL
  Filled 2021-03-12 (×9): qty 1

## 2021-03-12 NOTE — Progress Notes (Signed)
Patient arrived to the unit via stretcher from ER.Patient is alert but disoriented to time and situation.Patient is on a cardizem drip and IV fluids at this time. Left upper arm PICC line is present on admission. IV nurse consulted. Patient was unable to answer some of his admission questions at this time. Will continue to monitor patient's orientation level and reassess. Will notify MD of any changes.

## 2021-03-12 NOTE — Progress Notes (Signed)
Pt HR is noted to be irregular with elevated rates anywhere from one teens to 130's; Cardizem gtt stopped earlier today with rate in 90's per day RN report. PO cardizem administered approximately one hour ago with little change in rate. Pt denies any SOB, CP or dizziness. On call provider notified, will monitor pt for further increase or change over next hour.

## 2021-03-12 NOTE — Consult Note (Addendum)
Date of Admission:  03/11/2021          Reason for Consult: Fevers, sepsis   Referring Provider: Dr. 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 Would start colchicine to see if this improves his ankle pain--bilaterally septic ankles is possible but seems unlikely and his clinical picture and exam seems much more consistent with gout  Principal Problem:   Sepsis secondary to UTI [facility]) Active Problems:   Gout   Chronic systolic CHF (congestive heart failure) (HCC)   Atrial fibrillation with RVR (HCC)   CKD (chronic kidney disease), stage III (HCC)   Hypertension   Sacral osteomyelitis (HCC)   Prediabetes   Scheduled Meds:  (feeding supplement) PROSource Plus  30 mL Oral BID   allopurinol  150 mg Oral Daily   apixaban  5 mg Oral BID   vitamin C  500 mg Oral BID   atorvastatin  20 mg Oral QPM   Chlorhexidine Gluconate Cloth  6 each Topical Daily   diltiazem  60 mg Oral Q8H   divalproex  250 mg Oral BID   feeding supplement  237 mL Oral BID BM   ferrous sulfate  325 mg Oral BID WC   furosemide  20 mg Oral Daily   midodrine  5 mg Oral TID WC   pantoprazole  40 mg Oral Daily   tamsulosin  0.4 mg Oral QPC supper   traMADol  25 mg Oral BID   Continuous Infusions:  ceFEPime (MAXIPIME) IV 2 g (03/12/21 0548)    diltiazem (CARDIZEM) infusion 2.5 mg/hr (03/12/21 1229)   lactated ringers 125 mL/hr at 03/12/21 1115   metronidazole 500 mg (03/12/21 0624)   vancomycin 1,000 mg (03/12/21 0541)   PRN Meds:.sodium chloride flush  HPI: Jeffery Christian is a 70 y.o. male who was hospitalized on November 09, 2020 after being found down at home and unresponsive.  He tells me today that he had been found underneath the dresser that had fallen on him.  On admission he was encephalopathic with acute kidney injury and urinary retention and atrial fibrillation.  He eventually was transferred to [facility] on 16 November 2020.  Apparently there was a gluteal abscess that was debrided and then treated with doxycycline.  Ultimately this area worsened and an MRI was performed which showed evidence of osteomyelitis in the sacrococcygeal area.  He was referred to my partner Dr. Orvan Falconer who saw him at [facility].  Dr. Orvan Falconer arrange for the patient to have a PICC line placed and for him to be initiated on ceftriaxone 2 g IV daily along with oral metronidazole 500 mg 3 times daily.  He was admitted yesterday with acute encephalopathy hypotension fevers to 102.5 and septic picture.  He had lactic acidosis.  Urine had pyuria blood cultures have been sent as well as urine cultures although I still see no results for the  latter.  He seems to have improved on broadened therapy of vancomycin cefepime and metronidazole.  When I examined his wound at the bedside today it was not overtly purulent.  I do not think that his decubitus ulcer is the source of his sepsis.  I think a urinary source seems more likely.  He tells me that he has not had any urinary symptoms prior to coming into the hospital but he really does not remember much at all about being admitted to the hospital this time.  I spent more than 80 minutes with the patient including greater than 50% of time in face to face counseling of the patient personally reviewing  radiographs, along with pertinent laboratory microbiological data review of medical records and in coordination of his care.     Review of Systems: Review of Systems  Unable to perform ROS: Acuity of condition   Past Medical History:  Diagnosis Date   Chronic kidney disease    Chronic systolic CHF (congestive heart failure) (HCC)    EF 35-40, diffuse HK, mild MR, moderate LAE, mild RAE, PASP 44, L pleural eff   Dilated cardiomyopathy (HCC)    likely related to tachycardia   Dysrhythmia    Persistent atrial fibrillation (HCC)     Social History   Tobacco Use   Smoking status: Never   Smokeless tobacco: Never  Vaping Use   Vaping Use: Never used  Substance Use Topics   Alcohol use: Not Currently    Comment: pt states he "does not drink a lot anymore"   Drug use: No    Family History  Problem Relation Age of Onset   Leukemia Father        64   Cancer Mother    No Known Allergies  OBJECTIVE: Blood pressure 107/74, pulse 89, temperature 97.6 F (36.4 C), temperature source Oral, resp. rate 18, height 6' (1.829 m), weight 87.4 kg, SpO2 100 %.  Physical Exam Constitutional:      Appearance: He is well-developed. He is obese.  HENT:     Head: Normocephalic and atraumatic.  Eyes:     Extraocular Movements: Extraocular movements intact.     Conjunctiva/sclera: Conjunctivae normal.  Cardiovascular:     Rate and Rhythm: Normal rate. Rhythm irregular.     Heart sounds: No murmur heard.   No friction rub. No gallop.  Pulmonary:     Effort: Pulmonary effort is normal. No respiratory distress.     Breath sounds: No stridor. No wheezing or rhonchi.  Abdominal:     General: There is no distension.     Palpations: Abdomen is soft. There is no mass.     Tenderness: There is no abdominal tenderness.  Musculoskeletal:        General: No tenderness. Normal range of motion.     Cervical back: Normal range of motion and neck supple.  Skin:    General: Skin is warm and dry.      Coloration: Skin is pale.     Findings: No erythema or rash.  Neurological:     General: No focal deficit present.     Mental Status: He is alert.  Psychiatric:        Speech: Speech is delayed.        Behavior: Behavior is cooperative.        Cognition and Memory: Memory is impaired. He exhibits impaired recent memory and impaired remote memory.    Decubitus ulcer picture today March 12, 2021:  Lab Results Lab Results  Component Value Date   WBC 9.6 03/12/2021   HGB 10.4 (L) 03/12/2021   HCT 33.3 (L) 03/12/2021   MCV 84.1 03/12/2021   PLT 224 03/12/2021    Lab Results  Component Value Date   CREATININE 1.13 03/12/2021   BUN 20 03/12/2021   NA 136 03/12/2021   K 4.1 03/12/2021   CL 101 03/12/2021   CO2 25 03/12/2021    Lab Results  Component Value Date   ALT 8 03/11/2021   AST 13 (L) 03/11/2021   ALKPHOS 53 03/11/2021   BILITOT 0.6 03/11/2021     Microbiology: Recent Results (from the past 240 hour(s))  Culture, blood (routine x 2)     Status: None (Preliminary result)   Collection Time: 03/11/21  1:33 PM   Specimen: BLOOD RIGHT HAND  Result Value Ref Range Status   Specimen Description BLOOD RIGHT HAND  Final   Special Requests   Final    BOTTLES DRAWN AEROBIC AND ANAEROBIC Blood Culture results may not be optimal due to an inadequate volume of blood received in culture bottles   Culture   Final    NO GROWTH < 24 HOURS Performed at [facility], [address] [place], [place] [address]    Report Status PENDING  Incomplete  Culture, blood (routine x 2)     Status: None (Preliminary result)   Collection Time: 03/11/21  1:38 PM   Specimen: BLOOD LEFT HAND  Result Value Ref Range Status   Specimen Description BLOOD LEFT HAND  Final   Special Requests   Final    BOTTLES DRAWN AEROBIC AND ANAEROBIC Blood Culture adequate volume   Culture   Final    NO GROWTH < 24 HOURS Performed at [facility], [address], [place], [place]  [address]    Report Status PENDING  Incomplete  Resp Panel by RT-PCR (Flu A&B, Covid) Nasopharyngeal Swab     Status: None   Collection Time: 03/11/21  3:05 PM   Specimen: Nasopharyngeal Swab; Nasopharyngeal(NP) swabs in vial transport medium  Result Value Ref Range Status   SARS Coronavirus 2 by RT PCR NEGATIVE NEGATIVE Final    Comment: (NOTE) SARS-CoV-2 target nucleic acids are NOT DETECTED.  The SARS-CoV-2 RNA is generally detectable in upper respiratory specimens during the acute phase of infection. The lowest concentration of SARS-CoV-2 viral copies this assay can detect is 138 copies/mL. A negative result does not preclude SARS-Cov-2 infection and should not be used as the sole basis for treatment or other patient management decisions. A negative result may occur with  improper specimen collection/handling, submission of specimen other than nasopharyngeal swab, presence of viral mutation(s) within the areas targeted by this assay, and inadequate number of viral copies(<138 copies/mL). A negative result must be combined with clinical observations, patient history, and epidemiological information. The expected result is Negative.  Fact Sheet for Patients:  BloggerCourse.com  Fact Sheet for Healthcare Providers:  SeriousBroker.it  This test is no t yet approved or cleared by the [place] FDA and  has been authorized for detection and/or diagnosis of SARS-CoV-2 by FDA under an Emergency Use Authorization (EUA). This EUA will remain  in effect (meaning this test can be used) for the duration of the COVID-19 declaration under Section 564(b)(1) of the Act, 21 U.S.C.section 360bbb-3(b)(1), unless the authorization is terminated  or revoked sooner.       Influenza A by PCR NEGATIVE NEGATIVE Final  Influenza B by PCR NEGATIVE NEGATIVE Final    Comment: (NOTE) The Xpert Xpress SARS-CoV-2/FLU/RSV plus assay is intended as an  aid in the diagnosis of influenza from Nasopharyngeal swab specimens and should not be used as a sole basis for treatment. Nasal washings and aspirates are unacceptable for Xpert Xpress SARS-CoV-2/FLU/RSV testing.  Fact Sheet for Patients: BloggerCourse.com  Fact Sheet for Healthcare Providers: SeriousBroker.it  This test is not yet approved or cleared by the [place] FDA and has been authorized for detection and/or diagnosis of SARS-CoV-2 by FDA under an Emergency Use Authorization (EUA). This EUA will remain in effect (meaning this test can be used) for the duration of the COVID-19 declaration under Section 564(b)(1) of the Act, 21 U.S.C. section 360bbb-3(b)(1), unless the authorization is terminated or revoked.  Performed at [facility], [address], [place], [place] [address]   MRSA PCR Screening     Status: None   Collection Time: 03/12/21  5:35 AM   Specimen: Nasal Mucosa; Nasopharyngeal  Result Value Ref Range Status   MRSA by PCR NEGATIVE NEGATIVE Final    Comment:        The GeneXpert MRSA Assay (FDA approved for NASAL specimens only), is one component of a comprehensive MRSA colonization surveillance program. It is not intended to diagnose MRSA infection nor to guide or monitor treatment for MRSA infections. Performed at [facility], [address] [place], [place] [address]     Acey Lav, MD [facility] for Infectious Disease [facility] [phone] pager  03/12/2021, 1:33 PM

## 2021-03-12 NOTE — Progress Notes (Signed)
PROGRESS NOTE                                                                                                                                                                                                             Patient Demographics:    Jeffery Christian, is a 70 y.o. male, DOB - 07-29-1951, EGB:[id number]  Outpatient Primary MD for the patient is Hoy Register, MD    LOS - 1  Admit date - 03/11/2021    Chief Complaint  Patient presents with   Altered Mental Status       Brief Narrative (HPI from H&P)  Jeffery Christian is a 70 y.o. male with medical history significant of atrial fibrillation, CKD stage 3, HTN, prediabetes, gout, sacral osteomyelitis and decubitus ulcer followed by ID and currently on flagyl and rocephin x 6 week course (started in may) presented from ED secondary to AMS, he was diagnosed with sepsis with source likely being UTI versus his sacral decubitus ulcer, he was admitted for further treatment.   Subjective:    Jeffery Christian today has, No headache, No chest pain, No abdominal pain - No Nausea, No new weakness tingling or numbness, No SOB.   Assessment  & Plan :     Sepsis secondary to UTI, sacral decubitus ulcer and sacral myelitis already present on admission - he has sacral osteomyelitis along with sacral decubitus ulcer for which he is on 6 weeks of IV antibiotics going through PICC line by ID clinic started sometime in May.  Currently he has been placed on broad-spectrum IV antibiotics, cultures being ordered and followed, currently with aggressive IV fluids his sepsis pathophysiology has resolved.  Have consulted wound care and ostomy team along with ID to assist with further management.  2.  Paroxysmal A. fib with RVR [place] vas 2 score of greater than 3.  Currently on Cardizem drip, blood pressures are improving will overlap with oral Cardizem and try to titrate down Cardizem drip, continue Eliquis.  TSH is  stable and borderline elevated likely due to sick euthyroid syndrome.  3.  CKD stage III with mild AKI.  Resolved after hydration.  4.  Chronic hypotension.  On midodrine 3 times daily.  Resume.  5.  Diastolic CHF EF 60% on recent echocardiogram.  Currently dehydrated hydrate and monitor.  Holding home dose Lasix.  6.  History of gout.  Continue allopurinol.  7. Toxic encephalopathy due to #1 above.  Much improved.  Lab Results  Component Value Date   HGBA1C 5.3 06/08/2020         Condition - Extremely Guarded  Family Communication  :  number listed for daughter disconnected  Code Status :  DNR - per patient  Consults  :  ID  PUD Prophylaxis : PPI   Procedures  :            Disposition Plan  :    Status is: Inpatient  Remains inpatient appropriate because:IV treatments appropriate due to intensity of illness or inability to take PO  Dispo: The patient is from: Home              Anticipated d/c is to: Home              Patient currently is not medically stable to d/c.   Difficult to place patient No  DVT Prophylaxis  :   apixaban (ELIQUIS) tablet 5 mg    Lab Results  Component Value Date   PLT 224 03/12/2021    Diet :  Diet Order             Diet Heart Room service appropriate? Yes; Fluid consistency: Thin  Diet effective now                    Inpatient Medications  Scheduled Meds:  (feeding supplement) PROSource Plus  30 mL Oral BID   allopurinol  150 mg Oral Daily   apixaban  5 mg Oral BID   vitamin C  500 mg Oral BID   atorvastatin  20 mg Oral QPM   Chlorhexidine Gluconate Cloth  6 each Topical Daily   diltiazem  60 mg Oral Q8H   divalproex  250 mg Oral BID   feeding supplement  237 mL Oral BID BM   ferrous sulfate  325 mg Oral BID WC   furosemide  20 mg Oral Daily   pantoprazole  40 mg Oral Daily   tamsulosin  0.4 mg Oral QPC supper   traMADol  25 mg Oral BID   Continuous Infusions:  ceFEPime (MAXIPIME) IV 2 g (03/12/21 0548)    diltiazem (CARDIZEM) infusion 15 mg/hr (03/12/21 0813)   lactated ringers     lactated ringers     metronidazole 500 mg (03/12/21 0624)   vancomycin 1,000 mg (03/12/21 0541)   PRN Meds:.sodium chloride flush  Antibiotics  :    Anti-infectives (From admission, onward)    Start     Dose/Rate Route Frequency Ordered Stop   03/12/21 0500  vancomycin (VANCOREADY) IVPB 1000 mg/200 mL        1,000 mg 200 mL/hr over 60 Minutes Intravenous Every 12 hours 03/11/21 1551     03/11/21 2200  ceFEPIme (MAXIPIME) 2 g in sodium chloride 0.9 % 100 mL IVPB  Status:  Discontinued        2 g 200 mL/hr over 30 Minutes Intravenous Every 8 hours 03/11/21 1851 03/11/21 1856   03/11/21 1530  ceFEPIme (MAXIPIME) 2 g in sodium chloride 0.9 % 100 mL IVPB        2 g 200 mL/hr over 30 Minutes Intravenous Every 8 hours 03/11/21 1519     03/11/21 1530  vancomycin (VANCOREADY) IVPB 1750 mg/350 mL        1,750 mg 175 mL/hr over 120 Minutes Intravenous  Once 03/11/21 1519 03/11/21 1826  03/11/21 1500  metroNIDAZOLE (FLAGYL) IVPB 500 mg        500 mg 100 mL/hr over 60 Minutes Intravenous Every 8 hours 03/11/21 1433          Time Spent in minutes  30   Susa Raring M.D on 03/12/2021 at 9:23 AM  To page go to [email or web address]   [facility] -  Office  [phone]    See all Orders from today for further details    Objective:   Vitals:   03/11/21 2240 03/12/21 0000 03/12/21 0400 03/12/21 0415  BP: 115/74 128/85 97/72   Pulse: (!) 104 (!) 110 95   Resp: 18 19 19    Temp: 99 F (37.2 C)  98.8 F (37.1 C)   TempSrc: Oral  Oral   SpO2: 100% 100% 100%   Weight: 87.4 kg   87.4 kg  Height: 6' (1.829 m)       Wt Readings from Last 3 Encounters:  03/12/21 87.4 kg  01/25/21 84.5 kg  01/21/21 84.2 kg     Intake/Output Summary (Last 24 hours) at 03/12/2021 0923 Last data filed at 03/12/2021 0548 Gross per 24 hour  Intake 5122.37 ml  Output 350 ml  Net 4772.37 ml     Physical  Exam  Awake Alert, No new F.N deficits, Normal affect [place].AT,PERRAL Supple Neck,No JVD, No cervical lymphadenopathy appriciated.  Symmetrical Chest wall movement, Good air movement bilaterally, CTAB RRR,No Gallops,Rubs or new Murmurs, No Parasternal Heave +ve B.Sounds, Abd Soft, No tenderness, No organomegaly appriciated, No rebound - guarding or rigidity. No Cyanosis, chronic 1+ leg edema  Pressure Injury Buttocks Right Unstageable - Full thickness tissue loss in which the base of the injury is covered by slough (yellow, tan, gray, green or brown) and/or eschar (tan, brown or black) in the wound bed. (Active)     Location: Buttocks  Location Orientation: Right  Staging: Unstageable - Full thickness tissue loss in which the base of the injury is covered by slough (yellow, tan, gray, green or brown) and/or eschar (tan, brown or black) in the wound bed.  Wound Description (Comments):   Present on Admission: Yes        Data Review:    CBC Recent Labs  Lab 03/11/21 1350 03/12/21 0310  WBC 11.2* 9.6  HGB 12.3* 10.4*  HCT 38.8* 33.3*  PLT 283 224  MCV 84.2 84.1  MCH 26.7 26.3  MCHC 31.7 31.2  RDW 19.8* 19.4*  LYMPHSABS 1.6 0.5*  MONOABS 1.1* 1.0  EOSABS 0.0 0.0  BASOSABS 0.0 0.0    Recent Labs  Lab 03/11/21 0000 03/11/21 1350 03/11/21 2344 03/12/21 0310  NA  --  139  --  136  K  --  4.5  --  4.1  CL  --  101  --  101  CO2  --  28  --  25  GLUCOSE  --  172*  --  256*  BUN  --  21  --  20  CREATININE  --  1.28*  --  1.13  CALCIUM  --  8.9  --  8.5*  AST  --  13*  --   --   ALT  --  8  --   --   ALKPHOS  --  53  --   --   BILITOT  --  0.6  --   --   ALBUMIN  --  2.6*  --   --   CRP  --   --  14.8*  --   LATICACIDVEN  --  2.6* 1.3  --   INR  --  1.7*  --   --   TSH  --   --  6.343*  --   AMMONIA  --   --  10  --   BNP 44.4  --   --   --     ------------------------------------------------------------------------------------------------------------------ No  results for input(s): CHOL, HDL, LDLCALC, TRIG, CHOLHDL, LDLDIRECT in the last 72 hours.  Lab Results  Component Value Date   HGBA1C 5.3 06/08/2020   ------------------------------------------------------------------------------------------------------------------ Recent Labs    03/11/21 2344  TSH 6.343*    Cardiac Enzymes No results for input(s): CKMB, TROPONINI, MYOGLOBIN in the last 168 hours.  Invalid input(s): CK ------------------------------------------------------------------------------------------------------------------    Component Value Date/Time   BNP 44.4 03/11/2021 0000    Micro Results Recent Results (from the past 240 hour(s))  Culture, blood (routine x 2)     Status: None (Preliminary result)   Collection Time: 03/11/21  1:33 PM   Specimen: BLOOD RIGHT HAND  Result Value Ref Range Status   Specimen Description BLOOD RIGHT HAND  Final   Special Requests   Final    BOTTLES DRAWN AEROBIC AND ANAEROBIC Blood Culture results may not be optimal due to an inadequate volume of blood received in culture bottles   Culture   Final    NO GROWTH < 24 HOURS Performed at [facility], [address] [place], [place] [address]    Report Status PENDING  Incomplete  Culture, blood (routine x 2)     Status: None (Preliminary result)   Collection Time: 03/11/21  1:38 PM   Specimen: BLOOD LEFT HAND  Result Value Ref Range Status   Specimen Description BLOOD LEFT HAND  Final   Special Requests   Final    BOTTLES DRAWN AEROBIC AND ANAEROBIC Blood Culture adequate volume   Culture   Final    NO GROWTH < 24 HOURS Performed at [facility], [address] [place], [place] [address]    Report Status PENDING  Incomplete  Resp Panel by RT-PCR (Flu A&B, Covid) Nasopharyngeal Swab     Status: None   Collection Time: 03/11/21  3:05 PM   Specimen: Nasopharyngeal Swab; Nasopharyngeal(NP) swabs in vial transport medium  Result Value Ref Range Status   SARS  Coronavirus 2 by RT PCR NEGATIVE NEGATIVE Final    Comment: (NOTE) SARS-CoV-2 target nucleic acids are NOT DETECTED.  The SARS-CoV-2 RNA is generally detectable in upper respiratory specimens during the acute phase of infection. The lowest concentration of SARS-CoV-2 viral copies this assay can detect is 138 copies/mL. A negative result does not preclude SARS-Cov-2 infection and should not be used as the sole basis for treatment or other patient management decisions. A negative result may occur with  improper specimen collection/handling, submission of specimen other than nasopharyngeal swab, presence of viral mutation(s) within the areas targeted by this assay, and inadequate number of viral copies(<138 copies/mL). A negative result must be combined with clinical observations, patient history, and epidemiological information. The expected result is Negative.  Fact Sheet for Patients:  BloggerCourse.com  Fact Sheet for Healthcare Providers:  SeriousBroker.it  This test is no t yet approved or cleared by the [place] FDA and  has been authorized for detection and/or diagnosis of SARS-CoV-2 by FDA under an Emergency Use Authorization (EUA). This EUA will remain  in effect (meaning this test can be used) for the duration  of the COVID-19 declaration under Section 564(b)(1) of the Act, 21 U.S.C.section 360bbb-3(b)(1), unless the authorization is terminated  or revoked sooner.       Influenza A by PCR NEGATIVE NEGATIVE Final   Influenza B by PCR NEGATIVE NEGATIVE Final    Comment: (NOTE) The Xpert Xpress SARS-CoV-2/FLU/RSV plus assay is intended as an aid in the diagnosis of influenza from Nasopharyngeal swab specimens and should not be used as a sole basis for treatment. Nasal washings and aspirates are unacceptable for Xpert Xpress SARS-CoV-2/FLU/RSV testing.  Fact Sheet for  Patients: BloggerCourse.com  Fact Sheet for Healthcare Providers: SeriousBroker.it  This test is not yet approved or cleared by the [place] FDA and has been authorized for detection and/or diagnosis of SARS-CoV-2 by FDA under an Emergency Use Authorization (EUA). This EUA will remain in effect (meaning this test can be used) for the duration of the COVID-19 declaration under Section 564(b)(1) of the Act, 21 U.S.C. section 360bbb-3(b)(1), unless the authorization is terminated or revoked.  Performed at [facility], [address] [place], [place] [address]   MRSA PCR Screening     Status: None   Collection Time: 03/12/21  5:35 AM   Specimen: Nasal Mucosa; Nasopharyngeal  Result Value Ref Range Status   MRSA by PCR NEGATIVE NEGATIVE Final    Comment:        The GeneXpert MRSA Assay (FDA approved for NASAL specimens only), is one component of a comprehensive MRSA colonization surveillance program. It is not intended to diagnose MRSA infection nor to guide or monitor treatment for MRSA infections. Performed at [facility], [address] [place], [place] [address]     Radiology Reports DG Chest 2 View  Addendum Date: 03/11/2021   ADDENDUM REPORT: 03/11/2021 23:21 ADDENDUM: Left PICC line is in place with the tip in the SVC. Electronically Signed   By: Charlett Nose M.D.   On: 03/11/2021 23:21   Result Date: 03/11/2021 CLINICAL DATA:  Possible sepsis EXAM: CHEST - 2 VIEW COMPARISON:  03/11/2021 FINDINGS: Cardiomegaly. Low lung volumes with bibasilar atelectasis. No confluent opacities, effusions or edema. IMPRESSION: Low lung volumes with bibasilar atelectasis. Cardiomegaly. Electronically Signed: By: Charlett Nose M.D. On: 03/11/2021 20:02   DG Chest Port 1 View  Result Date: 03/11/2021 CLINICAL DATA:  Question sepsis.  Altered mental status. EXAM: PORTABLE CHEST 1 VIEW COMPARISON:  11/11/2020 FINDINGS:  Cardiomegaly. Aortic atherosclerosis and tortuosity. Pulmonary vascularity is normal. The right lung is clear. Question mild atelectasis or infiltrate at the medial left base. Suggest lateral chest radiography when able. No effusion. IMPRESSION: Cardiomegaly and aortic atherosclerosis. Question mild volume loss or infiltrate at the medial left base. This could be better evaluated with lateral chest radiography when able. Electronically Signed   By: Paulina Fusi M.D.   On: 03/11/2021 15:10

## 2021-03-13 DIAGNOSIS — M10271 Drug-induced gout, right ankle and foot: Secondary | ICD-10-CM

## 2021-03-13 LAB — COMPREHENSIVE METABOLIC PANEL
ALT: 8 U/L (ref 0–44)
AST: 12 U/L — ABNORMAL LOW (ref 15–41)
Albumin: 1.9 g/dL — ABNORMAL LOW (ref 3.5–5.0)
Alkaline Phosphatase: 40 U/L (ref 38–126)
Anion gap: 9 (ref 5–15)
BUN: 18 mg/dL (ref 8–23)
CO2: 27 mmol/L (ref 22–32)
Calcium: 8.5 mg/dL — ABNORMAL LOW (ref 8.9–10.3)
Chloride: 105 mmol/L (ref 98–111)
Creatinine, Ser: 1 mg/dL (ref 0.61–1.24)
GFR, Estimated: >60 mL/min (ref >60)
Glucose, Bld: 102 mg/dL — ABNORMAL HIGH (ref 70–99)
Potassium: 3.6 mmol/L (ref 3.5–5.1)
Sodium: 141 mmol/L (ref 135–145)
Total Bilirubin: 0.6 mg/dL (ref 0.3–1.2)
Total Protein: 4.9 g/dL — ABNORMAL LOW (ref 6.5–8.1)

## 2021-03-13 LAB — PROCALCITONIN: Procalcitonin: 0.43 ng/mL

## 2021-03-13 LAB — CBC WITH DIFFERENTIAL/PLATELET
Abs Immature Granulocytes: 0.03 10*3/uL (ref 0.00–0.07)
Basophils Absolute: 0 10*3/uL (ref 0.0–0.1)
Basophils Relative: 1 %
Eosinophils Absolute: 0.2 10*3/uL (ref 0.0–0.5)
Eosinophils Relative: 2 %
HCT: 31.4 % — ABNORMAL LOW (ref 39.0–52.0)
Hemoglobin: 9.9 g/dL — ABNORMAL LOW (ref 13.0–17.0)
Immature Granulocytes: 0 %
Lymphocytes Relative: 18 %
Lymphs Abs: 1.5 10*3/uL (ref 0.7–4.0)
MCH: 26.7 pg (ref 26.0–34.0)
MCHC: 31.5 g/dL (ref 30.0–36.0)
MCV: 84.6 fL (ref 80.0–100.0)
Monocytes Absolute: 0.9 10*3/uL (ref 0.1–1.0)
Monocytes Relative: 11 %
Neutro Abs: 5.6 10*3/uL (ref 1.7–7.7)
Neutrophils Relative %: 68 %
Platelets: 210 10*3/uL (ref 150–400)
RBC: 3.71 MIL/uL — ABNORMAL LOW (ref 4.22–5.81)
RDW: 19.3 % — ABNORMAL HIGH (ref 11.5–15.5)
WBC: 8.2 10*3/uL (ref 4.0–10.5)
nRBC: 0 % (ref 0.0–0.2)

## 2021-03-13 LAB — C-REACTIVE PROTEIN: CRP: 13.6 mg/dL — ABNORMAL HIGH (ref <1.0)

## 2021-03-13 LAB — MAGNESIUM: Magnesium: 1.6 mg/dL — ABNORMAL LOW (ref 1.7–2.4)

## 2021-03-13 LAB — BRAIN NATRIURETIC PEPTIDE: B Natriuretic Peptide: 62 pg/mL (ref 0.0–100.0)

## 2021-03-13 LAB — HEPATITIS B SURFACE ANTIGEN: Hepatitis B Surface Ag: NONREACTIVE

## 2021-03-13 MED ORDER — LACTATED RINGERS IV BOLUS
500.0000 mL | Freq: Once | INTRAVENOUS | Status: AC
  Administered 2021-03-13: 500 mL via INTRAVENOUS

## 2021-03-13 MED ORDER — MAGNESIUM SULFATE IN D5W 1-5 GM/100ML-% IV SOLN
1.0000 g | Freq: Once | INTRAVENOUS | Status: AC
  Administered 2021-03-13: 1 g via INTRAVENOUS
  Filled 2021-03-13: qty 100

## 2021-03-13 MED ORDER — MAGNESIUM SULFATE 2 GM/50ML IV SOLN
2.0000 g | Freq: Once | INTRAVENOUS | Status: AC
  Administered 2021-03-13: 2 g via INTRAVENOUS
  Filled 2021-03-13: qty 50

## 2021-03-13 MED ORDER — POTASSIUM CHLORIDE CRYS ER 20 MEQ PO TBCR
40.0000 meq | EXTENDED_RELEASE_TABLET | Freq: Once | ORAL | Status: AC
  Administered 2021-03-13: 40 meq via ORAL
  Filled 2021-03-13: qty 2

## 2021-03-13 MED ORDER — COLCHICINE 0.3 MG HALF TABLET
0.3000 mg | ORAL_TABLET | Freq: Every day | ORAL | Status: DC
  Administered 2021-03-13 – 2021-03-16 (×4): 0.3 mg via ORAL
  Filled 2021-03-13 (×4): qty 1

## 2021-03-13 NOTE — Evaluation (Signed)
Physical Therapy Evaluation Patient Details Name: Jeffery Christian MRN: [id number] DOB: 07-28-1951 Today's Date: 03/13/2021   History of Present Illness  Pt is a 70 y/o male admitted secondary to AMS and sepsis secondary to a UTI. PMH including but not limited to atrial fibrillation, CKD stage 3, HTN, prediabetes, gout, sacral osteomyelitis and decubitus ulcer.  Clinical Impression  Pt presented supine in bed with HOB elevated, awake and willing to participate in therapy session. No family/caregivers present during evaluation and pt a very poor historian, therefore unsure of his true PLOF. Pt stating that he lives at home alone; however, per chart review pt is from [facility]. At the time of evaluation, pt very limited with his mobility secondary to pain and weakness. He required heavy physical assistance of two for bed mobility and mod-max A to maintain a sitting position at EOB. Of note, his HR did increase to as high as 161 bpm with activity. Pt on RA throughout with SpO2 >90% throughout, BP stable as well. Pt would continue to benefit from skilled physical therapy services at this time while admitted and after d/c to address the below listed limitations in order to improve overall safety and independence with functional mobility.     Follow Up Recommendations SNF    Equipment Recommendations  None recommended by PT    Recommendations for Other Services       Precautions / Restrictions Precautions Precautions: Fall Precaution Comments: monitor HR Restrictions Weight Bearing Restrictions: No      Mobility  Bed Mobility Overal bed mobility: Needs Assistance Bed Mobility: Rolling;Sit to Supine;Supine to Sit Rolling: Max assist;+2 for physical assistance   Supine to sit: Max assist;+2 for physical assistance;HOB elevated Sit to supine: Max assist;+2 for physical assistance   General bed mobility comments: pt initially refusing assistance with his LE movement; however, finally  agreeable to allowing PT to assist. He required heavy physical assistance of 2 for trunk management, bilateral LE movement off of and back onto bed and to roll towards his L side in supine to put him in a sidelying position off of his wound    Transfers                 General transfer comment: Defer due to safety and pain  Ambulation/Gait                Stairs            Wheelchair Mobility    Modified Rankin (Stroke Patients Only)       Balance Overall balance assessment: Needs assistance Sitting-balance support: Feet supported;Bilateral upper extremity supported Sitting balance-Leahy Scale: Poor Sitting balance - Comments: heavy left lateral lean and required mod-max A to maintain sitting at EOB Postural control: Left lateral lean                                   Pertinent Vitals/Pain Pain Assessment: Faces Faces Pain Scale: Hurts even more Pain Location: bilateral feet and hands from gout Pain Descriptors / Indicators: Grimacing;Guarding Pain Intervention(s): Monitored during session;Repositioned    Home Living Family/patient expects to be discharged to:: Skilled nursing facility                 Additional Comments: pt a poor historian throughout and reporting that he is from home alone, that he needs assistance when attempting to get up OOB but when asked who helps him he  reports "no one"    Prior Function Level of Independence: Needs assistance         Comments: Unsure - pt a poor historian throughout and reporting that he is from home alone, that he needs assistance when attempting to get up OOB but when asked who helps him he reports "no one"     Hand Dominance        Extremity/Trunk Assessment   Upper Extremity Assessment Upper Extremity Assessment: Defer to OT evaluation    Lower Extremity Assessment Lower Extremity Assessment: Difficult to assess due to impaired cognition       Communication    Communication: Expressive difficulties  Cognition Arousal/Alertness: Awake/alert Behavior During Therapy: Flat affect Overall Cognitive Status: No family/caregiver present to determine baseline cognitive functioning Area of Impairment: Orientation;Attention;Memory;Following commands;Safety/judgement;Awareness;Problem solving                 Orientation Level: Disoriented to;Time;Situation Current Attention Level: Focused Memory: Decreased short-term memory Following Commands: Follows one step commands inconsistently;Follows one step commands with increased time Safety/Judgement: Decreased awareness of deficits;Decreased awareness of safety Awareness: Intellectual Problem Solving: Slow processing;Decreased initiation;Difficulty sequencing;Requires verbal cues        General Comments General comments (skin integrity, edema, etc.): HR elevating to 161 when sitting at EOB. SpO2 90s on RA. BP stable.    Exercises     Assessment/Plan    PT Assessment Patient needs continued PT services  PT Problem List Decreased strength;Decreased range of motion;Decreased activity tolerance;Decreased balance;Decreased mobility;Decreased coordination;Decreased cognition;Decreased knowledge of use of DME;Decreased safety awareness;Decreased knowledge of precautions;Cardiopulmonary status limiting activity;Pain       PT Treatment Interventions DME instruction;Gait training;Functional mobility training;Therapeutic activities;Therapeutic exercise;Balance training;Neuromuscular re-education;Cognitive remediation;Patient/family education    PT Goals (Current goals can be found in the Care Plan section)  Acute Rehab PT Goals Patient Stated Goal: none stated PT Goal Formulation: With patient Time For Goal Achievement: 03/27/21 Potential to Achieve Goals: Fair    Frequency Min 2X/week   Barriers to discharge        Co-evaluation PT/OT/SLP Co-Evaluation/Treatment: Yes Reason for Co-Treatment: For  patient/therapist safety;To address functional/ADL transfers PT goals addressed during session: Mobility/safety with mobility;Balance;Strengthening/ROM         AM-PAC PT "6 Clicks" Mobility  Outcome Measure Help needed turning from your back to your side while in a flat bed without using bedrails?: Total Help needed moving from lying on your back to sitting on the side of a flat bed without using bedrails?: Total Help needed moving to and from a bed to a chair (including a wheelchair)?: Total Help needed standing up from a chair using your arms (e.g., wheelchair or bedside chair)?: Total Help needed to walk in hospital room?: Total Help needed climbing 3-5 steps with a railing? : Total 6 Click Score: 6    End of Session   Activity Tolerance: Patient limited by pain Patient left: in bed;with call bell/phone within reach;with bed alarm set Nurse Communication: Mobility status PT Visit Diagnosis: Other abnormalities of gait and mobility (R26.89)    Time: 1610-9604 PT Time Calculation (min) (ACUTE ONLY): 29 min   Charges:   PT Evaluation $PT Eval Moderate Complexity: 1 Mod          Ginette Pitman, PT, DPT  Acute Rehabilitation Services Pager [phone] Office [phone]   Alessandra Bevels Annisa Mazzarella 03/13/2021, 12:47 PM

## 2021-03-13 NOTE — Progress Notes (Signed)
OT Cancellation Note  Patient Details Name: Jeffery Christian MRN: [id number] DOB: 06-28-1951   Cancelled Treatment:    Reason Eval/Treat Not Completed: Active bedrest order. OT to return as medically appropriate.   Ladene Artist, OTDS   Ladene Artist 03/13/2021, 7:38 AM

## 2021-03-13 NOTE — Evaluation (Addendum)
Occupational Therapy Evaluation Patient Details Name: Jeffery Christian MRN: [id number] DOB: 1951-04-14 Today's Date: 03/13/2021    History of Present Illness 70 y/o male admitted secondary to AMS and sepsis secondary to a UTI. PMH including but not limited to atrial fibrillation, CKD stage 3, HTN, prediabetes, gout, sacral osteomyelitis and decubitus ulcer.   Clinical Impression   Pt reporting he lives at home alone and was independent. However, per chart review, pt was from [facility]. Pt currently requiring Max-Total A for bathing, dressing, and toileting. Requiring Max A +2 for bed mobility and sitting at EOB with support. HR elevating to 161 max at EOB, SpO2 >90% on RA, and BP stable. Pt would benefit from further acute OT to facilitate safe dc. Recommend dc to SNF for further OT to optimize safety, independence with ADLs, and return to PLOF.     Follow Up Recommendations  SNF    Equipment Recommendations  None recommended by OT    Recommendations for Other Services       Precautions / Restrictions Precautions Precautions: Fall Precaution Comments: monitor HR Restrictions Weight Bearing Restrictions: No      Mobility Bed Mobility Overal bed mobility: Needs Assistance Bed Mobility: Rolling;Sit to Supine;Supine to Sit Rolling: Max assist;+2 for physical assistance   Supine to sit: Max assist;+2 for physical assistance;HOB elevated Sit to supine: Max assist;+2 for physical assistance   General bed mobility comments: pt initially refusing assistance with his LE movement; however, finally agreeable to allowing PT to assist. He required heavy physical assistance of 2 for trunk management, bilateral LE movement off of and back onto bed and to roll towards his L side in supine to put him in a sidelying position off of his wound    Transfers                 General transfer comment: Defer due to safety and pain    Balance Overall balance assessment: Needs  assistance Sitting-balance support: Feet supported;Bilateral upper extremity supported Sitting balance-Leahy Scale: Poor Sitting balance - Comments: heavy left lateral lean and required mod-max A to maintain sitting at EOB Postural control: Left lateral lean                                 ADL either performed or assessed with clinical judgement   ADL Overall ADL's : Needs assistance/impaired Eating/Feeding: Set up;Bed level Eating/Feeding Details (indicate cue type and reason): Holding fork and knife in premature grasps to cut food Grooming: Wash/dry face;Set up;Bed level   Upper Body Bathing: Maximal assistance;Sitting   Lower Body Bathing: Total assistance;Bed level   Upper Body Dressing : Maximal assistance;Sitting   Lower Body Dressing: Total assistance;Bed level Lower Body Dressing Details (indicate cue type and reason): don socks               General ADL Comments: Pt presenting with poor cognition and activity toelrance. HR elevating to 160s with simple movement limiting OOB activity.     Vision         Perception     Praxis      Pertinent Vitals/Pain Pain Assessment: Faces Faces Pain Scale: Hurts even more Pain Location: bilateral feet and hands from gout Pain Descriptors / Indicators: Grimacing;Guarding Pain Intervention(s): Monitored during session;Limited activity within patient's tolerance;Repositioned     Hand Dominance     Extremity/Trunk Assessment Upper Extremity Assessment Upper Extremity Assessment: Generalized weakness;LUE deficits/detail LUE Deficits /  Details: Reports he has gout in his L hand   Lower Extremity Assessment Lower Extremity Assessment: Defer to PT evaluation   Cervical / Trunk Assessment Cervical / Trunk Assessment: Kyphotic   Communication Communication Communication: Expressive difficulties   Cognition Arousal/Alertness: Awake/alert Behavior During Therapy: Flat affect Overall Cognitive Status: No  family/caregiver present to determine baseline cognitive functioning Area of Impairment: Orientation;Attention;Memory;Following commands;Safety/judgement;Awareness;Problem solving                 Orientation Level: Disoriented to;Time;Situation Current Attention Level: Focused Memory: Decreased short-term memory Following Commands: Follows one step commands inconsistently;Follows one step commands with increased time Safety/Judgement: Decreased awareness of deficits;Decreased awareness of safety Awareness: Intellectual Problem Solving: Slow processing;Decreased initiation;Difficulty sequencing;Requires verbal cues     General Comments  HR elevating to 161 when sitting at EOB. SpO2 90s on RA. BP stable.    Exercises     Shoulder Instructions      Home Living Family/patient expects to be discharged to:: Skilled nursing facility                                 Additional Comments: pt a poor historian throughout and reporting that he is from home alone, that he needs assistance when attempting to get up OOB but when asked who helps him he reports "no one"      Prior Functioning/Environment Level of Independence: Needs assistance        Comments: Unsure - pt a poor historian throughout and reporting that he is from home alone, that he needs assistance when attempting to get up OOB but when asked who helps him he reports "no one"        OT Problem List: Decreased strength;Decreased range of motion;Decreased activity tolerance;Impaired balance (sitting and/or standing);Decreased knowledge of use of DME or AE;Decreased knowledge of precautions      OT Treatment/Interventions: Self-care/ADL training;Therapeutic exercise;DME and/or AE instruction;Therapeutic activities;Patient/family education    OT Goals(Current goals can be found in the care plan section) Acute Rehab OT Goals Patient Stated Goal: none stated OT Goal Formulation: With patient Time For Goal  Achievement: 03/27/21 Potential to Achieve Goals: Good  OT Frequency: Min 2X/week   Barriers to D/C:            Co-evaluation PT/OT/SLP Co-Evaluation/Treatment: Yes Reason for Co-Treatment: For patient/therapist safety;To address functional/ADL transfers PT goals addressed during session: Mobility/safety with mobility;Balance;Strengthening/ROM OT goals addressed during session: ADL's and self-care      AM-PAC OT "6 Clicks" Daily Activity     Outcome Measure Help from another person eating meals?: A Little Help from another person taking care of personal grooming?: A Little Help from another person toileting, which includes using toliet, bedpan, or urinal?: Total Help from another person bathing (including washing, rinsing, drying)?: A Lot Help from another person to put on and taking off regular upper body clothing?: A Lot Help from another person to put on and taking off regular lower body clothing?: Total 6 Click Score: 12   End of Session Nurse Communication: Mobility status (HR)  Activity Tolerance: Patient limited by fatigue;Other (comment) (HR) Patient left: in bed;with call bell/phone within reach;with bed alarm set  OT Visit Diagnosis: Unsteadiness on feet (R26.81);Other abnormalities of gait and mobility (R26.89);Muscle weakness (generalized) (M62.81)                Time: 4627-0350 OT Time Calculation (min): 26 min Charges:  OT General Charges $  OT Visit: 1 Visit OT Evaluation $OT Eval Moderate Complexity: 1 Mod  Jerelyn Trimarco MSOT, OTR/L Acute Rehab Pager: [phone] Office: [phone]  Theodoro Grist Navi Ewton 03/13/2021, 1:47 PM

## 2021-03-13 NOTE — Progress Notes (Signed)
PT Cancellation Note  Patient Details Name: Jeffery Christian MRN: [id number] DOB: 1950-12-11   Cancelled Treatment:    Reason Eval/Treat Not Completed: Active bedrest order. PT will continue to follow-up with pt acutely and await updated activity orders prior to initiating evaluation.    Alessandra Bevels Damondre Pfeifle 03/13/2021, 8:15 AM

## 2021-03-13 NOTE — Progress Notes (Signed)
Subjective: No new complaints   Antibiotics:  Anti-infectives (From admission, onward)    Start     Dose/Rate Route Frequency Ordered Stop   03/12/21 0500  vancomycin (VANCOREADY) IVPB 1000 mg/200 mL        1,000 mg 200 mL/hr over 60 Minutes Intravenous Every 12 hours 03/11/21 1551     03/11/21 2200  ceFEPIme (MAXIPIME) 2 g in sodium chloride 0.9 % 100 mL IVPB  Status:  Discontinued        2 g 200 mL/hr over 30 Minutes Intravenous Every 8 hours 03/11/21 1851 03/11/21 1856   03/11/21 1530  ceFEPIme (MAXIPIME) 2 g in sodium chloride 0.9 % 100 mL IVPB        2 g 200 mL/hr over 30 Minutes Intravenous Every 8 hours 03/11/21 1519     03/11/21 1530  vancomycin (VANCOREADY) IVPB 1750 mg/350 mL        1,750 mg 175 mL/hr over 120 Minutes Intravenous  Once 03/11/21 1519 03/11/21 1826   03/11/21 1500  metroNIDAZOLE (FLAGYL) IVPB 500 mg        500 mg 100 mL/hr over 60 Minutes Intravenous Every 8 hours 03/11/21 1433         Medications: Scheduled Meds:  (feeding supplement) PROSource Plus  30 mL Oral BID   allopurinol  150 mg Oral Daily   apixaban  5 mg Oral BID   vitamin C  500 mg Oral BID   atorvastatin  20 mg Oral QPM   Chlorhexidine Gluconate Cloth  6 each Topical Daily   diltiazem  60 mg Oral Q8H   divalproex  250 mg Oral BID   feeding supplement  237 mL Oral BID BM   ferrous sulfate  325 mg Oral BID WC   midodrine  5 mg Oral TID WC   pantoprazole  40 mg Oral Daily   tamsulosin  0.4 mg Oral QPC supper   traMADol  25 mg Oral BID   Continuous Infusions:  ceFEPime (MAXIPIME) IV 2 g (03/13/21 1239)   magnesium sulfate bolus IVPB     metronidazole 500 mg (03/13/21 1434)   vancomycin 1,000 mg (03/13/21 0507)   PRN Meds:.sodium chloride flush    Objective: Weight change:   Intake/Output Summary (Last 24 hours) at 03/13/2021 1447 Last data filed at 03/13/2021 0933 Gross per 24 hour  Intake 2969.39 ml  Output 3000 ml  Net -30.61 ml   Blood pressure 102/69,  pulse 96, temperature 97.9 F (36.6 C), temperature source Oral, resp. rate 20, height 6' (1.829 m), weight 87.4 kg, SpO2 100 %. Temp:  [97.8 F (36.6 C)-99.4 F (37.4 C)] 97.9 F (36.6 C) (06/12 1431) Pulse Rate:  [96-122] 96 (06/12 1431) Resp:  [16-23] 20 (06/12 1431) BP: (92-117)/(51-88) 102/69 (06/12 1431) SpO2:  [96 %-100 %] 100 % (06/12 1431)  Physical Exam: Physical Exam Constitutional:      Appearance: He is well-developed.  HENT:     Head: Normocephalic and atraumatic.  Eyes:     Conjunctiva/sclera: Conjunctivae normal.  Cardiovascular:     Rate and Rhythm: Normal rate and regular rhythm.  Pulmonary:     Effort: Pulmonary effort is normal. No respiratory distress.     Breath sounds: Normal breath sounds. No stridor. No wheezing.  Abdominal:     General: There is no distension.     Palpations: Abdomen is soft.  Musculoskeletal:        General: Normal range of motion.  Cervical back: Normal range of motion and neck supple.     Right ankle: Swelling present. Tenderness present.     Left ankle: Swelling present. Tenderness present.  Skin:    General: Skin is warm and dry.     Coloration: Skin is pale.     Findings: No erythema or rash.  Neurological:     General: No focal deficit present.     Mental Status: He is alert.  Psychiatric:        Attention and Perception: Attention normal.        Speech: Speech is delayed.        Behavior: Behavior is cooperative.        Thought Content: Thought content normal.        Cognition and Memory: Memory is impaired. He exhibits impaired recent memory and impaired remote memory.     CBC:    BMET Recent Labs    03/12/21 0310 03/13/21 0435  NA 136 141  K 4.1 3.6  CL 101 105  CO2 25 27  GLUCOSE 256* 102*  BUN 20 18  CREATININE 1.13 1.00  CALCIUM 8.5* 8.5*     Liver Panel  Recent Labs    03/11/21 1350 03/13/21 0435  PROT 6.3* 4.9*  ALBUMIN 2.6* 1.9*  AST 13* 12*  ALT 8 8  ALKPHOS 53 40  BILITOT 0.6  0.6       Sedimentation Rate Recent Labs    03/11/21 1350  ESRSEDRATE 80*   C-Reactive Protein Recent Labs    03/11/21 2344 03/13/21 0435  CRP 14.8* 13.6*    Micro Results: Recent Results (from the past 720 hour(s))  Culture, blood (routine x 2)     Status: None (Preliminary result)   Collection Time: 03/11/21  1:33 PM   Specimen: BLOOD RIGHT HAND  Result Value Ref Range Status   Specimen Description BLOOD RIGHT HAND  Final   Special Requests   Final    BOTTLES DRAWN AEROBIC AND ANAEROBIC Blood Culture results may not be optimal due to an inadequate volume of blood received in culture bottles   Culture   Final    NO GROWTH 2 DAYS Performed at [facility], [address] [place], [place] [address]    Report Status PENDING  Incomplete  Culture, blood (routine x 2)     Status: None (Preliminary result)   Collection Time: 03/11/21  1:38 PM   Specimen: BLOOD LEFT HAND  Result Value Ref Range Status   Specimen Description BLOOD LEFT HAND  Final   Special Requests   Final    BOTTLES DRAWN AEROBIC AND ANAEROBIC Blood Culture adequate volume   Culture   Final    NO GROWTH 2 DAYS Performed at [facility], [address], [place], [place] [address]    Report Status PENDING  Incomplete  Urine culture     Status: Abnormal   Collection Time: 03/11/21  2:28 PM   Specimen: Urine, Random  Result Value Ref Range Status   Specimen Description URINE, RANDOM  Final   Special Requests NONE  Final   Culture (A)  Final    <10,000 COLONIES/mL INSIGNIFICANT GROWTH Performed at [facility], [address], [place], [place] [address]    Report Status 03/12/2021 FINAL  Final  Resp Panel by RT-PCR (Flu A&B, Covid) Nasopharyngeal Swab     Status: None   Collection Time: 03/11/21  3:05 PM   Specimen: Nasopharyngeal Swab; Nasopharyngeal(NP) swabs  in vial transport medium  Result Value Ref Range Status   SARS Coronavirus 2 by RT PCR NEGATIVE NEGATIVE Final     Comment: (NOTE) SARS-CoV-2 target nucleic acids are NOT DETECTED.  The SARS-CoV-2 RNA is generally detectable in upper respiratory specimens during the acute phase of infection. The lowest concentration of SARS-CoV-2 viral copies this assay can detect is 138 copies/mL. A negative result does not preclude SARS-Cov-2 infection and should not be used as the sole basis for treatment or other patient management decisions. A negative result may occur with  improper specimen collection/handling, submission of specimen other than nasopharyngeal swab, presence of viral mutation(s) within the areas targeted by this assay, and inadequate number of viral copies(<138 copies/mL). A negative result must be combined with clinical observations, patient history, and epidemiological information. The expected result is Negative.  Fact Sheet for Patients:  BloggerCourse.com  Fact Sheet for Healthcare Providers:  SeriousBroker.it  This test is no t yet approved or cleared by the [place] FDA and  has been authorized for detection and/or diagnosis of SARS-CoV-2 by FDA under an Emergency Use Authorization (EUA). This EUA will remain  in effect (meaning this test can be used) for the duration of the COVID-19 declaration under Section 564(b)(1) of the Act, 21 U.S.C.section 360bbb-3(b)(1), unless the authorization is terminated  or revoked sooner.       Influenza A by PCR NEGATIVE NEGATIVE Final   Influenza B by PCR NEGATIVE NEGATIVE Final    Comment: (NOTE) The Xpert Xpress SARS-CoV-2/FLU/RSV plus assay is intended as an aid in the diagnosis of influenza from Nasopharyngeal swab specimens and should not be used as a sole basis for treatment. Nasal washings and aspirates are unacceptable for Xpert Xpress SARS-CoV-2/FLU/RSV testing.  Fact Sheet for Patients: BloggerCourse.com  Fact Sheet for Healthcare  Providers: SeriousBroker.it  This test is not yet approved or cleared by the [place] FDA and has been authorized for detection and/or diagnosis of SARS-CoV-2 by FDA under an Emergency Use Authorization (EUA). This EUA will remain in effect (meaning this test can be used) for the duration of the COVID-19 declaration under Section 564(b)(1) of the Act, 21 U.S.C. section 360bbb-3(b)(1), unless the authorization is terminated or revoked.  Performed at [facility], [address] [place], [place] [address]   MRSA PCR Screening     Status: None   Collection Time: 03/12/21  5:35 AM   Specimen: Nasal Mucosa; Nasopharyngeal  Result Value Ref Range Status   MRSA by PCR NEGATIVE NEGATIVE Final    Comment:        The GeneXpert MRSA Assay (FDA approved for NASAL specimens only), is one component of a comprehensive MRSA colonization surveillance program. It is not intended to diagnose MRSA infection nor to guide or monitor treatment for MRSA infections. Performed at [facility], [address] [place], [place] [address]     Studies/Results: DG Chest 2 View  Addendum Date: 03/11/2021   ADDENDUM REPORT: 03/11/2021 23:21 ADDENDUM: Left PICC line is in place with the tip in the SVC. Electronically Signed   By: Charlett Nose M.D.   On: 03/11/2021 23:21   Result Date: 03/11/2021 CLINICAL DATA:  Possible sepsis EXAM: CHEST - 2 VIEW COMPARISON:  03/11/2021 FINDINGS: Cardiomegaly. Low lung volumes with bibasilar atelectasis. No confluent opacities, effusions or edema. IMPRESSION: Low lung volumes with bibasilar atelectasis. Cardiomegaly. Electronically Signed: By: Charlett Nose M.D. On: 03/11/2021 20:02   DG Chest [facility] 1 View  Result  Date: 03/12/2021 CLINICAL DATA:  Shortness of breath, history of CHF EXAM: PORTABLE CHEST 1 VIEW COMPARISON:  Prior chest x-ray 03/11/2021 FINDINGS: Left upper extremity PICC. Catheter tip overlies the mid SVC. Stable  cardiomegaly and mediastinal widening likely representing vascular congestion. No focal airspace consolidation, pleural effusion, pulmonary edema or pneumothorax. Mild vascular congestion is similar compared to prior. No acute osseous abnormality. IMPRESSION: Stable chest x-ray without evidence of acute cardiopulmonary process. Electronically Signed   By: Malachy Moan M.D.   On: 03/12/2021 10:18   DG Chest Port 1 View  Result Date: 03/11/2021 CLINICAL DATA:  Question sepsis.  Altered mental status. EXAM: PORTABLE CHEST 1 VIEW COMPARISON:  11/11/2020 FINDINGS: Cardiomegaly. Aortic atherosclerosis and tortuosity. Pulmonary vascularity is normal. The right lung is clear. Question mild atelectasis or infiltrate at the medial left base. Suggest lateral chest radiography when able. No effusion. IMPRESSION: Cardiomegaly and aortic atherosclerosis. Question mild volume loss or infiltrate at the medial left base. This could be better evaluated with lateral chest radiography when able. Electronically Signed   By: Paulina Fusi M.D.   On: 03/11/2021 15:10      Assessment/Plan:  INTERVAL HISTORY: Clinically seems improved   Principal Problem:   Sepsis secondary to UTI [facility]) Active Problems:   Gout   Chronic systolic CHF (congestive heart failure) (HCC)   Atrial fibrillation with RVR (HCC)   CKD (chronic kidney disease), stage III (HCC)   Hypertension   Sacral osteomyelitis (HCC)   Prediabetes    Jeffery Christian is a 70 y.o. 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 Orvan Falconer in late April and started on ceftriaxone and metronidazole.  Patient was admitted with encephalopathy hypotension and fevers up to 102.5 with concerns for sepsis.  #1 Sepsis:  There is not a clear cause here.  While he had pyuria his urine cultures are with insignificant growth  Cultures are without any growth x2 days.  The PICC line Does not appear overtly infected  His  sacral decubitus ulcer did not have overt pus coming from the wound when I examined it  While he did have bilateral ankle tenderness and pain this seems much more consistent with gout than infection  Would continue current antimicrobials for now.  Would start colchicine to treat for gout.  Hopefully we can make some rationale decisions re his antibiotics tomorrow or Tuesday.  One consideration I would give would be to DC PICC line though it does not appear infected   #2 Sacral coccygeal osteomyelitis:   In terms of treatment duration he should be nearing completion of the initial attempt to treat him with antibiotics.  Sed rate is down relative to when he was first diagnosed with this.  Dr. Luciana Axe or Dr. Earlene Plater will see the patient tomorrow.   I spent more than 35 minutes with the patient including greater than 50% of time in face to face counseling of the patient personally reviewing radiographs, along with pertinent laboratory microbiological data review of medical records and in coordination of his care.    LOS: 2 days   Acey Lav 03/13/2021, 2:47 PM

## 2021-03-13 NOTE — Progress Notes (Signed)
   03/13/21 1231  Assess: MEWS Score  Temp 98.7 F (37.1 C)  BP 117/88  Pulse Rate (!) 112  ECG Heart Rate (!) 112  Resp 18  SpO2 98 %  Assess: MEWS Score  MEWS Temp 0  MEWS Systolic 0  MEWS Pulse 2  MEWS RR 0  MEWS LOC 0  MEWS Score 2  MEWS Score Color Yellow  Assess: if the MEWS score is Yellow or Red  Were vital signs taken at a resting state? Yes  Focused Assessment No change from prior assessment  Early Detection of Sepsis Score *See Row Information* Low  MEWS guidelines implemented *See Row Information* No, vital signs rechecked  Treat  MEWS Interventions Administered scheduled meds/treatments  Pain Scale 0-10  Pain Score 0  Take Vital Signs  Increase Vital Sign Frequency  Yellow: Q 2hr X 2 then Q 4hr X 2, if remains yellow, continue Q 4hrs  Escalate  MEWS: Escalate Yellow: discuss with charge nurse/RN and consider discussing with provider and RRT  Notify: Charge Nurse/RN  Name of Charge Nurse/RN Notified Lindalou Hose  Date Charge Nurse/RN Notified 03/13/21  Time Charge Nurse/RN Notified 1253  Notify: Provider  Provider Name/Title Thedore Mins  Date Provider Notified 03/13/21  Time Provider Notified 1255  Notification Type Page  Notification Reason Other (Comment) (yellow MEWS)  Provider response No new orders  Date of Provider Response 03/13/21  Time of Provider Response 1305

## 2021-03-13 NOTE — Progress Notes (Signed)
PROGRESS NOTE                                                                                                                                                                                                             Patient Demographics:    Jeffery Christian, is a 70 y.o. male, DOB - [birth year], CHE:[id number]  Outpatient Primary MD for the patient is Hoy Register, MD    LOS - 2  Admit date - 03/11/2021    Chief Complaint  Patient presents with   Altered Mental Status       Brief Narrative (HPI from H&P)  Jeffery Christian is a 70 y.o. male with medical history significant of atrial fibrillation, CKD stage 3, HTN, prediabetes, gout, sacral osteomyelitis and decubitus ulcer followed by ID and currently on flagyl and rocephin x 6 week course (started in may) presented from ED secondary to AMS, he was diagnosed with sepsis with source likely being UTI versus his sacral decubitus ulcer, he was admitted for further treatment.   Subjective:   Patient in bed, appears comfortable, denies any headache, no fever, no chest pain or pressure, no shortness of breath , no abdominal pain. No new focal weakness.   Assessment  & Plan :     Sepsis secondary to UTI, sacral decubitus ulcer and sacral myelitis already present on admission - he has sacral osteomyelitis along with sacral decubitus ulcer for which he is on 6 weeks of IV antibiotics going through PICC line by ID clinic started sometime in May.  Presently his decubitus ulcer appears quite healthy pictured below, present episode appears to be UTI related, case discussed with ID, defer antibiotic management to ID will continue to monitor cultures.  2.  Paroxysmal A. fib with RVR [place] vas 2 score of greater than 3.  Initially on Cardizem drip currently on oral Cardizem along with Eliquis rate under control.  3.  CKD stage III with mild AKI.  Resolved after hydration.  4.  Chronic hypotension.   On home dose midodrine.  5.  Diastolic CHF EF 60% on recent echocardiogram.  Currently dehydrated hydrate and monitor.  Holding home dose Lasix.  6.  History of gout.  Continue allopurinol.  7. Toxic encephalopathy due to #1 above.  Much improved.  Lab Results  Component Value Date   HGBA1C 5.3 06/08/2020  Condition - Extremely Guarded  Family Communication  :  number listed for daughter disconnected  Code Status :  DNR - per patient  Consults  :  ID  PUD Prophylaxis : PPI   Procedures  :            Disposition Plan  :    Status is: Inpatient  Remains inpatient appropriate because:IV treatments appropriate due to intensity of illness or inability to take PO  Dispo: The patient is from: Home              Anticipated d/c is to: Home              Patient currently is not medically stable to d/c.   Difficult to place patient No  DVT Prophylaxis  :   apixaban (ELIQUIS) tablet 5 mg    Lab Results  Component Value Date   PLT 210 03/13/2021    Diet :  Diet Order             Diet Heart Room service appropriate? Yes; Fluid consistency: Thin  Diet effective now                    Inpatient Medications  Scheduled Meds:  (feeding supplement) PROSource Plus  30 mL Oral BID   allopurinol  150 mg Oral Daily   apixaban  5 mg Oral BID   vitamin C  500 mg Oral BID   atorvastatin  20 mg Oral QPM   Chlorhexidine Gluconate Cloth  6 each Topical Daily   diltiazem  60 mg Oral Q8H   divalproex  250 mg Oral BID   feeding supplement  237 mL Oral BID BM   ferrous sulfate  325 mg Oral BID WC   midodrine  5 mg Oral TID WC   pantoprazole  40 mg Oral Daily   tamsulosin  0.4 mg Oral QPC supper   traMADol  25 mg Oral BID   Continuous Infusions:  ceFEPime (MAXIPIME) IV 2 g (03/13/21 0625)   magnesium sulfate bolus IVPB 2 g (03/13/21 1013)   metronidazole 500 mg (03/13/21 0859)   vancomycin 1,000 mg (03/13/21 0507)   PRN Meds:.sodium chloride  flush  Antibiotics  :    Anti-infectives (From admission, onward)    Start     Dose/Rate Route Frequency Ordered Stop   03/12/21 0500  vancomycin (VANCOREADY) IVPB 1000 mg/200 mL        1,000 mg 200 mL/hr over 60 Minutes Intravenous Every 12 hours 03/11/21 1551     03/11/21 2200  ceFEPIme (MAXIPIME) 2 g in sodium chloride 0.9 % 100 mL IVPB  Status:  Discontinued        2 g 200 mL/hr over 30 Minutes Intravenous Every 8 hours 03/11/21 1851 03/11/21 1856   03/11/21 1530  ceFEPIme (MAXIPIME) 2 g in sodium chloride 0.9 % 100 mL IVPB        2 g 200 mL/hr over 30 Minutes Intravenous Every 8 hours 03/11/21 1519     03/11/21 1530  vancomycin (VANCOREADY) IVPB 1750 mg/350 mL        1,750 mg 175 mL/hr over 120 Minutes Intravenous  Once 03/11/21 1519 03/11/21 1826   03/11/21 1500  metroNIDAZOLE (FLAGYL) IVPB 500 mg        500 mg 100 mL/hr over 60 Minutes Intravenous Every 8 hours 03/11/21 1433          Time Spent in minutes  30   Ghassan Coggeshall  M.D on 03/13/2021 at 11:03 AM  To page go to [email or web address]   [facility] -  Office  [phone]    See all Orders from today for further details    Objective:   Vitals:   03/13/21 0006 03/13/21 0316 03/13/21 0732 03/13/21 0754  BP:  (!) 92/51 99/70 109/67  Pulse: (!) 104 100 (!) 108 96  Resp: 16 20 17 16   Temp:  98.4 F (36.9 C) 97.8 F (36.6 C) 97.8 F (36.6 C)  TempSrc:  Oral Oral Oral  SpO2: 100% 99% 100% 100%  Weight:      Height:        Wt Readings from Last 3 Encounters:  03/12/21 87.4 kg  01/25/21 84.5 kg  01/21/21 84.2 kg     Intake/Output Summary (Last 24 hours) at 03/13/2021 1103 Last data filed at 03/13/2021 0933 Gross per 24 hour  Intake 3209.39 ml  Output 3100 ml  Net 109.39 ml     Physical Exam  Awake Alert, No new F.N deficits, Normal affect [place].AT,PERRAL Supple Neck,No JVD, No cervical lymphadenopathy appriciated.  Symmetrical Chest wall movement, Good air movement bilaterally, CTAB RRR,No  Gallops, Rubs or new Murmurs, No Parasternal Heave +ve B.Sounds, Abd Soft, No tenderness, No organomegaly appriciated, No rebound - guarding or rigidity. No Cyanosis, chronic 1+ leg edema  Pressure Injury Buttocks Right Unstageable - Full thickness tissue loss in which the base of the injury is covered by slough (yellow, tan, gray, green or brown) and/or eschar (tan, brown or black) in the wound bed. (Active)     Location: Buttocks  Location Orientation: Right  Staging: Unstageable - Full thickness tissue loss in which the base of the injury is covered by slough (yellow, tan, gray, green or brown) and/or eschar (tan, brown or black) in the wound bed.  Wound Description (Comments):   Present on Admission: Yes         Data Review:    CBC Recent Labs  Lab 03/11/21 1350 03/12/21 0310 03/13/21 0435  WBC 11.2* 9.6 8.2  HGB 12.3* 10.4* 9.9*  HCT 38.8* 33.3* 31.4*  PLT 283 224 210  MCV 84.2 84.1 84.6  MCH 26.7 26.3 26.7  MCHC 31.7 31.2 31.5  RDW 19.8* 19.4* 19.3*  LYMPHSABS 1.6 0.5* 1.5  MONOABS 1.1* 1.0 0.9  EOSABS 0.0 0.0 0.2  BASOSABS 0.0 0.0 0.0    Recent Labs  Lab 03/11/21 0000 03/11/21 1350 03/11/21 2344 03/12/21 0310 03/13/21 0435  NA  --  139  --  136 141  K  --  4.5  --  4.1 3.6  CL  --  101  --  101 105  CO2  --  28  --  25 27  GLUCOSE  --  172*  --  256* 102*  BUN  --  21  --  20 18  CREATININE  --  1.28*  --  1.13 1.00  CALCIUM  --  8.9  --  8.5* 8.5*  AST  --  13*  --   --  12*  ALT  --  8  --   --  8  ALKPHOS  --  53  --   --  40  BILITOT  --  0.6  --   --  0.6  ALBUMIN  --  2.6*  --   --  1.9*  MG  --   --   --   --  1.6*  CRP  --   --  14.8*  --  13.6*  PROCALCITON  --   --   --  0.26 0.43  LATICACIDVEN  --  2.6* 1.3  --   --   INR  --  1.7*  --   --   --   TSH  --   --  6.343*  --   --   AMMONIA  --   --  10  --   --   BNP 44.4  --   --   --  62.0     ------------------------------------------------------------------------------------------------------------------ No results for input(s): CHOL, HDL, LDLCALC, TRIG, CHOLHDL, LDLDIRECT in the last 72 hours.  Lab Results  Component Value Date   HGBA1C 5.3 06/08/2020   ------------------------------------------------------------------------------------------------------------------ Recent Labs    03/11/21 2344  TSH 6.343*    Cardiac Enzymes No results for input(s): CKMB, TROPONINI, MYOGLOBIN in the last 168 hours.  Invalid input(s): CK ------------------------------------------------------------------------------------------------------------------    Component Value Date/Time   BNP 62.0 03/13/2021 0435    Micro Results Recent Results (from the past 240 hour(s))  Culture, blood (routine x 2)     Status: None (Preliminary result)   Collection Time: 03/11/21  1:33 PM   Specimen: BLOOD RIGHT HAND  Result Value Ref Range Status   Specimen Description BLOOD RIGHT HAND  Final   Special Requests   Final    BOTTLES DRAWN AEROBIC AND ANAEROBIC Blood Culture results may not be optimal due to an inadequate volume of blood received in culture bottles   Culture   Final    NO GROWTH 2 DAYS Performed at [facility], [address], [place], [place] [address]    Report Status PENDING  Incomplete  Culture, blood (routine x 2)     Status: None (Preliminary result)   Collection Time: 03/11/21  1:38 PM   Specimen: BLOOD LEFT HAND  Result Value Ref Range Status   Specimen Description BLOOD LEFT HAND  Final   Special Requests   Final    BOTTLES DRAWN AEROBIC AND ANAEROBIC Blood Culture adequate volume   Culture   Final    NO GROWTH 2 DAYS Performed at [facility], [address], [place], [place] [address]    Report Status PENDING  Incomplete  Urine culture     Status: Abnormal   Collection Time: 03/11/21  2:28 PM   Specimen: Urine, Random  Result Value Ref Range  Status   Specimen Description URINE, RANDOM  Final   Special Requests NONE  Final   Culture (A)  Final    <10,000 COLONIES/mL INSIGNIFICANT GROWTH Performed at [facility], [address] [place], [place] [address]    Report Status 03/12/2021 FINAL  Final  Resp Panel by RT-PCR (Flu A&B, Covid) Nasopharyngeal Swab     Status: None   Collection Time: 03/11/21  3:05 PM   Specimen: Nasopharyngeal Swab; Nasopharyngeal(NP) swabs in vial transport medium  Result Value Ref Range Status   SARS Coronavirus 2 by RT PCR NEGATIVE NEGATIVE Final    Comment: (NOTE) SARS-CoV-2 target nucleic acids are NOT DETECTED.  The SARS-CoV-2 RNA is generally detectable in upper respiratory specimens during the acute phase of infection. The lowest concentration of SARS-CoV-2 viral copies this assay can detect is 138 copies/mL. A negative result does not preclude SARS-Cov-2 infection and should not be used as the sole basis for treatment or other patient management decisions. A negative result may occur with  improper specimen collection/handling, submission of specimen other than nasopharyngeal swab, presence of viral  mutation(s) within the areas targeted by this assay, and inadequate number of viral copies(<138 copies/mL). A negative result must be combined with clinical observations, patient history, and epidemiological information. The expected result is Negative.  Fact Sheet for Patients:  BloggerCourse.com  Fact Sheet for Healthcare Providers:  SeriousBroker.it  This test is no t yet approved or cleared by the [place] FDA and  has been authorized for detection and/or diagnosis of SARS-CoV-2 by FDA under an Emergency Use Authorization (EUA). This EUA will remain  in effect (meaning this test can be used) for the duration of the COVID-19 declaration under Section 564(b)(1) of the Act, 21 U.S.C.section 360bbb-3(b)(1), unless the  authorization is terminated  or revoked sooner.       Influenza A by PCR NEGATIVE NEGATIVE Final   Influenza B by PCR NEGATIVE NEGATIVE Final    Comment: (NOTE) The Xpert Xpress SARS-CoV-2/FLU/RSV plus assay is intended as an aid in the diagnosis of influenza from Nasopharyngeal swab specimens and should not be used as a sole basis for treatment. Nasal washings and aspirates are unacceptable for Xpert Xpress SARS-CoV-2/FLU/RSV testing.  Fact Sheet for Patients: BloggerCourse.com  Fact Sheet for Healthcare Providers: SeriousBroker.it  This test is not yet approved or cleared by the [place] FDA and has been authorized for detection and/or diagnosis of SARS-CoV-2 by FDA under an Emergency Use Authorization (EUA). This EUA will remain in effect (meaning this test can be used) for the duration of the COVID-19 declaration under Section 564(b)(1) of the Act, 21 U.S.C. section 360bbb-3(b)(1), unless the authorization is terminated or revoked.  Performed at [facility], [address], [place], [place] [address]   MRSA PCR Screening     Status: None   Collection Time: 03/12/21  5:35 AM   Specimen: Nasal Mucosa; Nasopharyngeal  Result Value Ref Range Status   MRSA by PCR NEGATIVE NEGATIVE Final    Comment:        The GeneXpert MRSA Assay (FDA approved for NASAL specimens only), is one component of a comprehensive MRSA colonization surveillance program. It is not intended to diagnose MRSA infection nor to guide or monitor treatment for MRSA infections. Performed at [facility], [address], [place], [place] [address]     Radiology Reports DG Chest 2 View  Addendum Date: 03/11/2021   ADDENDUM REPORT: 03/11/2021 23:21 ADDENDUM: Left PICC line is in place with the tip in the SVC. Electronically Signed   By: Charlett Nose M.D.   On: 03/11/2021 23:21   Result Date: 03/11/2021 CLINICAL DATA:  Possible sepsis  EXAM: CHEST - 2 VIEW COMPARISON:  03/11/2021 FINDINGS: Cardiomegaly. Low lung volumes with bibasilar atelectasis. No confluent opacities, effusions or edema. IMPRESSION: Low lung volumes with bibasilar atelectasis. Cardiomegaly. Electronically Signed: By: Charlett Nose M.D. On: 03/11/2021 20:02   DG Chest Port 1 View  Result Date: 03/12/2021 CLINICAL DATA:  Shortness of breath, history of CHF EXAM: PORTABLE CHEST 1 VIEW COMPARISON:  Prior chest x-ray 03/11/2021 FINDINGS: Left upper extremity PICC. Catheter tip overlies the mid SVC. Stable cardiomegaly and mediastinal widening likely representing vascular congestion. No focal airspace consolidation, pleural effusion, pulmonary edema or pneumothorax. Mild vascular congestion is similar compared to prior. No acute osseous abnormality. IMPRESSION: Stable chest x-ray without evidence of acute cardiopulmonary process. Electronically Signed   By: Malachy Moan M.D.   On: 03/12/2021 10:18   DG Chest Port 1 View  Result Date: 03/11/2021 CLINICAL DATA:  Question sepsis.  Altered mental  status. EXAM: PORTABLE CHEST 1 VIEW COMPARISON:  11/11/2020 FINDINGS: Cardiomegaly. Aortic atherosclerosis and tortuosity. Pulmonary vascularity is normal. The right lung is clear. Question mild atelectasis or infiltrate at the medial left base. Suggest lateral chest radiography when able. No effusion. IMPRESSION: Cardiomegaly and aortic atherosclerosis. Question mild volume loss or infiltrate at the medial left base. This could be better evaluated with lateral chest radiography when able. Electronically Signed   By: Paulina FusiMark  Shogry M.D.   On: 03/11/2021 15:10

## 2021-03-14 LAB — CBC WITH DIFFERENTIAL/PLATELET
Abs Immature Granulocytes: 0.03 10*3/uL (ref 0.00–0.07)
Basophils Absolute: 0 10*3/uL (ref 0.0–0.1)
Basophils Relative: 0 %
Eosinophils Absolute: 0.2 10*3/uL (ref 0.0–0.5)
Eosinophils Relative: 2 %
HCT: 31.2 % — ABNORMAL LOW (ref 39.0–52.0)
Hemoglobin: 9.6 g/dL — ABNORMAL LOW (ref 13.0–17.0)
Immature Granulocytes: 0 %
Lymphocytes Relative: 19 %
Lymphs Abs: 1.5 10*3/uL (ref 0.7–4.0)
MCH: 26.2 pg (ref 26.0–34.0)
MCHC: 30.8 g/dL (ref 30.0–36.0)
MCV: 85 fL (ref 80.0–100.0)
Monocytes Absolute: 0.7 10*3/uL (ref 0.1–1.0)
Monocytes Relative: 8 %
Neutro Abs: 5.5 10*3/uL (ref 1.7–7.7)
Neutrophils Relative %: 71 %
Platelets: 239 10*3/uL (ref 150–400)
RBC: 3.67 MIL/uL — ABNORMAL LOW (ref 4.22–5.81)
RDW: 19.3 % — ABNORMAL HIGH (ref 11.5–15.5)
WBC: 7.9 10*3/uL (ref 4.0–10.5)
nRBC: 0 % (ref 0.0–0.2)

## 2021-03-14 LAB — COMPREHENSIVE METABOLIC PANEL
ALT: 6 U/L (ref 0–44)
AST: 12 U/L — ABNORMAL LOW (ref 15–41)
Albumin: 1.9 g/dL — ABNORMAL LOW (ref 3.5–5.0)
Alkaline Phosphatase: 41 U/L (ref 38–126)
Anion gap: 8 (ref 5–15)
BUN: 19 mg/dL (ref 8–23)
CO2: 26 mmol/L (ref 22–32)
Calcium: 8.4 mg/dL — ABNORMAL LOW (ref 8.9–10.3)
Chloride: 106 mmol/L (ref 98–111)
Creatinine, Ser: 1.06 mg/dL (ref 0.61–1.24)
GFR, Estimated: >60 mL/min (ref >60)
Glucose, Bld: 106 mg/dL — ABNORMAL HIGH (ref 70–99)
Potassium: 3.6 mmol/L (ref 3.5–5.1)
Sodium: 140 mmol/L (ref 135–145)
Total Bilirubin: 0.6 mg/dL (ref 0.3–1.2)
Total Protein: 5.1 g/dL — ABNORMAL LOW (ref 6.5–8.1)

## 2021-03-14 LAB — HEMOGLOBIN A1C
Hgb A1c MFr Bld: 5.3 % (ref 4.8–5.6)
Mean Plasma Glucose: 105 mg/dL

## 2021-03-14 LAB — SARS CORONAVIRUS 2 (TAT 6-24 HRS): SARS Coronavirus 2: NEGATIVE

## 2021-03-14 LAB — BRAIN NATRIURETIC PEPTIDE: B Natriuretic Peptide: 97.8 pg/mL (ref 0.0–100.0)

## 2021-03-14 LAB — PROCALCITONIN: Procalcitonin: 0.32 ng/mL

## 2021-03-14 LAB — C-REACTIVE PROTEIN: CRP: 12.3 mg/dL — ABNORMAL HIGH (ref <1.0)

## 2021-03-14 LAB — MAGNESIUM: Magnesium: 2.2 mg/dL (ref 1.7–2.4)

## 2021-03-14 MED ORDER — METRONIDAZOLE 500 MG PO TABS
500.0000 mg | ORAL_TABLET | Freq: Three times a day (TID) | ORAL | Status: DC
  Administered 2021-03-14 – 2021-03-16 (×6): 500 mg via ORAL
  Filled 2021-03-14 (×6): qty 1

## 2021-03-14 MED ORDER — SODIUM CHLORIDE 0.9 % IV SOLN
2.0000 g | INTRAVENOUS | Status: DC
  Administered 2021-03-14 – 2021-03-15 (×2): 2 g via INTRAVENOUS
  Filled 2021-03-14 (×2): qty 20

## 2021-03-14 NOTE — Progress Notes (Signed)
[facility]  Date of Admission:  03/11/2021   Total days of antibiotics 4         ASSESSMENT: Patient presented to the hospital for altered mental status, fever and tachycardia concern for sepsis.  Patient appears clinically stable today, afebrile overnight.  No obvious source of infection seen.  Blood culture negative to date.  Urine culture was unremarkable.  Low suspicion for septic arthritis of bilateral knees.  Sacral wounds appear clean and noninfected.  Patient was on Rocephin and oral Flagyl for his sacral osteomyelitis.  Given no obvious source of infection with improvement of mental status, will de-escalate his antibiotic treatment back to original regimen of Rocephin and oral Flagyl.  We will continue this regimen until finishing 6 weeks of antibiotics.  Patient will follow-up with Dr. Orvan Falconer after discharge.  PLAN: Stop vancomycin, cefepime  Resume IV Rocephin and oral Flagyl to finish 6 weeks of antibiotics Follow-up with ID outpatient Follow-up blood culture  Principal Problem:   Sepsis (HCC) Active Problems:   Gout   Chronic systolic CHF (congestive heart failure) (HCC)   Atrial fibrillation with rapid ventricular response (HCC)   CKD (chronic kidney disease), stage III (HCC)   Hypertension   Sacral osteomyelitis (HCC)   Prediabetes   Scheduled Meds:  (feeding supplement) PROSource Plus  30 mL Oral BID   allopurinol  150 mg Oral Daily   apixaban  5 mg Oral BID   vitamin C  500 mg Oral BID   atorvastatin  20 mg Oral QPM   Chlorhexidine Gluconate Cloth  6 each Topical Daily   colchicine  0.3 mg Oral Daily   diltiazem  60 mg Oral Q8H   divalproex  250 mg Oral BID   feeding supplement  237 mL Oral BID BM   ferrous sulfate  325 mg Oral BID WC   metroNIDAZOLE  500 mg Oral Q8H   midodrine  5 mg Oral TID WC   pantoprazole  40 mg Oral Daily   tamsulosin  0.4 mg Oral QPC supper   traMADol  25 mg Oral BID   Continuous  Infusions:  cefTRIAXone (ROCEPHIN)  IV     PRN Meds:.sodium chloride flush   SUBJECTIVE: Patient is seen at bedside.  He appears comfortable.  He is alert, awake and oriented to person and place but not to time.  Denies any complaints.  States that his knees are not bothering him.  Said that his sacral wound is not hurting.  Review of Systems: ROS Per HPI   No Known Allergies  OBJECTIVE: Vitals:   03/14/21 0100 03/14/21 0300 03/14/21 0750 03/14/21 0831  BP:  107/72 108/81 108/81  Pulse: 94 (!) 103 (!) 107 95  Resp: 15 17 16 19   Temp:  98.3 F (36.8 C) 98.1 F (36.7 C) 98.1 F (36.7 C)  TempSrc:  Oral Oral Oral  SpO2: 100% 100% 99% 97%  Weight:      Height:       Body mass index is 26.13 kg/m.  Physical Exam Constitutional:      General: He is not in acute distress.    Appearance: He is not ill-appearing.     Comments: Alert, awake, oriented to person and place not to time.  HENT:     Head: Normocephalic.  Eyes:     General: No scleral icterus.       Right eye: No discharge.  Left eye: No discharge.     Conjunctiva/sclera: Conjunctivae normal.  Cardiovascular:     Rate and Rhythm: Normal rate and regular rhythm.     Heart sounds: Normal heart sounds.  Pulmonary:     Effort: Pulmonary effort is normal. No respiratory distress.  Abdominal:     Tenderness: There is no abdominal tenderness.  Musculoskeletal:        General: Normal range of motion.     Comments: Bilateral knees nontender to palpation, no warmth to touch.  Very mild effusion of bilateral knees.  Skin:    Comments: Sacral wound visualized.  No pus present.  No signs of active infection  Neurological:     Mental Status: He is alert. Mental status is at baseline.    Lab Results Lab Results  Component Value Date   WBC 7.9 03/14/2021   HGB 9.6 (L) 03/14/2021   HCT 31.2 (L) 03/14/2021   MCV 85.0 03/14/2021   PLT 239 03/14/2021    Lab Results  Component Value Date   CREATININE 1.06  03/14/2021   BUN 19 03/14/2021   NA 140 03/14/2021   K 3.6 03/14/2021   CL 106 03/14/2021   CO2 26 03/14/2021    Lab Results  Component Value Date   ALT 6 03/14/2021   AST 12 (L) 03/14/2021   ALKPHOS 41 03/14/2021   BILITOT 0.6 03/14/2021     Microbiology: Recent Results (from the past 240 hour(s))  Culture, blood (routine x 2)     Status: None (Preliminary result)   Collection Time: 03/11/21  1:33 PM   Specimen: BLOOD RIGHT HAND  Result Value Ref Range Status   Specimen Description BLOOD RIGHT HAND  Final   Special Requests   Final    BOTTLES DRAWN AEROBIC AND ANAEROBIC Blood Culture results may not be optimal due to an inadequate volume of blood received in culture bottles   Culture   Final    NO GROWTH 3 DAYS Performed at [facility], [address], [place], [place] [address]    Report Status PENDING  Incomplete  Culture, blood (routine x 2)     Status: None (Preliminary result)   Collection Time: 03/11/21  1:38 PM   Specimen: BLOOD LEFT HAND  Result Value Ref Range Status   Specimen Description BLOOD LEFT HAND  Final   Special Requests   Final    BOTTLES DRAWN AEROBIC AND ANAEROBIC Blood Culture adequate volume   Culture   Final    NO GROWTH 3 DAYS Performed at [facility], [address], [place], [place] [address]    Report Status PENDING  Incomplete  Urine culture     Status: Abnormal   Collection Time: 03/11/21  2:28 PM   Specimen: Urine, Random  Result Value Ref Range Status   Specimen Description URINE, RANDOM  Final   Special Requests NONE  Final   Culture (A)  Final    <10,000 COLONIES/mL INSIGNIFICANT GROWTH Performed at [facility], [address] [place], [place] [address]    Report Status 03/12/2021 FINAL  Final  Resp Panel by RT-PCR (Flu A&B, Covid) Nasopharyngeal Swab     Status: None   Collection Time: 03/11/21  3:05 PM   Specimen: Nasopharyngeal Swab; Nasopharyngeal(NP) swabs in vial transport medium  Result Value Ref  Range Status   SARS Coronavirus 2 by RT PCR NEGATIVE NEGATIVE Final    Comment: (NOTE) SARS-CoV-2 target nucleic acids are NOT DETECTED.  The SARS-CoV-2 RNA is generally detectable in upper respiratory specimens during the acute phase of infection. The lowest concentration of SARS-CoV-2 viral copies this assay can detect is 138 copies/mL. A negative result does not preclude SARS-Cov-2 infection and should not be used as the sole basis for treatment or other patient management decisions. A negative result may occur with  improper specimen collection/handling, submission of specimen other than nasopharyngeal swab, presence of viral mutation(s) within the areas targeted by this assay, and inadequate number of viral copies(<138 copies/mL). A negative result must be combined with clinical observations, patient history, and epidemiological information. The expected result is Negative.  Fact Sheet for Patients:  BloggerCourse.com  Fact Sheet for Healthcare Providers:  SeriousBroker.it  This test is no t yet approved or cleared by the [place] FDA and  has been authorized for detection and/or diagnosis of SARS-CoV-2 by FDA under an Emergency Use Authorization (EUA). This EUA will remain  in effect (meaning this test can be used) for the duration of the COVID-19 declaration under Section 564(b)(1) of the Act, 21 U.S.C.section 360bbb-3(b)(1), unless the authorization is terminated  or revoked sooner.       Influenza A by PCR NEGATIVE NEGATIVE Final   Influenza B by PCR NEGATIVE NEGATIVE Final    Comment: (NOTE) The Xpert Xpress SARS-CoV-2/FLU/RSV plus assay is intended as an aid in the diagnosis of influenza from Nasopharyngeal swab specimens and should not be used as a sole basis for treatment. Nasal washings and aspirates are unacceptable for Xpert Xpress SARS-CoV-2/FLU/RSV testing.  Fact Sheet for  Patients: BloggerCourse.com  Fact Sheet for Healthcare Providers: SeriousBroker.it  This test is not yet approved or cleared by the [place] FDA and has been authorized for detection and/or diagnosis of SARS-CoV-2 by FDA under an Emergency Use Authorization (EUA). This EUA will remain in effect (meaning this test can be used) for the duration of the COVID-19 declaration under Section 564(b)(1) of the Act, 21 U.S.C. section 360bbb-3(b)(1), unless the authorization is terminated or revoked.  Performed at [facility], [address] [place], [place] [address]   MRSA PCR Screening     Status: None   Collection Time: 03/12/21  5:35 AM   Specimen: Nasal Mucosa; Nasopharyngeal  Result Value Ref Range Status   MRSA by PCR NEGATIVE NEGATIVE Final    Comment:        The GeneXpert MRSA Assay (FDA approved for NASAL specimens only), is one component of a comprehensive MRSA colonization surveillance program. It is not intended to diagnose MRSA infection nor to guide or monitor treatment for MRSA infections. Performed at [facility], [address], [place], [place] [address]     Doran Stabler, DO [facility] [phone] pager   [phone] cell 03/14/2021, 1:49 PM

## 2021-03-14 NOTE — TOC Initial Note (Signed)
Transition of Care [facility]) - Initial/Assessment Note    Patient Details  Name: Jeffery Christian MRN: [id number] Date of Birth: 04/06/1951  Transition of Care [facility]) CM/SW Contact:    Mearl Latin, LCSW Phone Number: 03/14/2021, 4:24 PM  Clinical Narrative:                 CSW spoke with patient's daughter, [place]. She confirmed that patient has been at [facility] the past few months and the plan is to return there at discharge via PTAR. CSW made [facility] aware that patient will return with IV antibiotics. MD ordered COVID test.   Expected Discharge Plan: Skilled Nursing Facility Barriers to Discharge: Continued Medical Work up   Patient Goals and CMS Choice Patient states their goals for this hospitalization and ongoing recovery are:: Return to SNF CMS Medicare.gov Compare Post Acute Care list provided to:: Patient Represenative (must comment) Choice offered to / list presented to : Adult Children  Expected Discharge Plan and Services Expected Discharge Plan: Skilled Nursing Facility In-house Referral: Clinical Social Work   Post Acute Care Choice: Skilled Nursing Facility Living arrangements for the past 2 months: Skilled Nursing Facility                                      Prior Living Arrangements/Services Living arrangements for the past 2 months: Skilled Nursing Facility Lives with:: Facility Resident Patient language and need for interpreter reviewed:: Yes Do you feel safe going back to the place where you live?: Yes      Need for Family Participation in Patient Care: Yes (Comment) Care giver support system in place?: Yes (comment)   Criminal Activity/Legal Involvement Pertinent to Current Situation/Hospitalization: No - Comment as needed  Activities of Daily Living Home Assistive Devices/Equipment: Wheelchair, Hospital bed ADL Screening (condition at time of admission) Patient's cognitive ability adequate to safely complete daily activities?: No Is the patient  deaf or have difficulty hearing?: No Does the patient have difficulty seeing, even when wearing glasses/contacts?: No Does the patient have difficulty concentrating, remembering, or making decisions?: Yes Patient able to express need for assistance with ADLs?: Yes Does the patient have difficulty dressing or bathing?: Yes Independently performs ADLs?: No Communication: Independent Dressing (OT): Dependent Grooming: Dependent Is this a change from baseline?: Pre-admission baseline Feeding: Needs assistance Is this a change from baseline?: Change from baseline, expected to last <3 days Bathing: Dependent Is this a change from baseline?: Pre-admission baseline Toileting: Needs assistance Is this a change from baseline?: Pre-admission baseline In/Out Bed: Dependent Is this a change from baseline?: Pre-admission baseline Walks in Home: Dependent Is this a change from baseline?: Pre-admission baseline Does the patient have difficulty walking or climbing stairs?: Yes Weakness of Legs: Both Weakness of Arms/Hands: Both  Permission Sought/Granted Permission sought to share information with : Facility Medical sales representative, Family Supports Permission granted to share information with : Yes, Verbal Permission Granted  Share Information with NAME: Haven  Permission granted to share info w AGENCY: [facility]  Permission granted to share info w Relationship: Daughter  Permission granted to share info w Contact Information: [phone]  Emotional Assessment Appearance:: Appears stated age Attitude/Demeanor/Rapport: Unable to Assess Affect (typically observed): Unable to Assess Orientation: : Oriented to Self, Oriented to Place Alcohol / Substance Use: Not Applicable Psych Involvement: No (comment)  Admission diagnosis:  Atrial fibrillation with rapid ventricular response (HCC) [I48.91] Sepsis (HCC) [A41.9] Urinary tract infection  without hematuria, site unspecified [N39.0] Sepsis, due to  unspecified organism, unspecified whether acute organ dysfunction present [facility]) [A41.9] Patient Active Problem List   Diagnosis Date Noted   Sepsis (HCC) 03/11/2021   Prediabetes 03/11/2021   Asymmetric edema of both lower extremities 02/22/2021   Decubitus ulcer of buttock 01/26/2021   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   Normochromic normocytic anemia 12/14/2020   Urinary retention 11/18/2020   Neurocognitive deficits 11/18/2020   Acute renal failure superimposed on stage 3 chronic kidney disease (HCC) 11/09/2020   Acute metabolic encephalopathy 11/09/2020   Hypernatremia 11/09/2020   Elevated troponin 11/09/2020   COVID-19 virus infection 11/09/2020   Hypertension 01/07/2018   Atrial fibrillation with rapid ventricular response (HCC) 08/05/2017   CKD (chronic kidney disease), stage III (HCC) 08/05/2017   Persistent atrial fibrillation (HCC)    Chronic systolic CHF (congestive heart failure) (HCC)    Dilated cardiomyopathy (HCC)    Shortness of breath    Gout 07/18/2017   Left buttock abscess 03/22/2014   PCP:  Hoy Register, MD Pharmacy:   [facility] [address] [place] [address] Phone: [phone] Fax: [phone]  [id number] [place], [place] - [address] [place] [address] Phone: [phone] Fax: [phone]  [facility] [place], [place] - [place] [address] [place] [address] Phone: [phone] Fax: [phone]     Social Determinants of Health (SDOH) Interventions    Readmission Risk Interventions No flowsheet data found.

## 2021-03-14 NOTE — Consult Note (Addendum)
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 Pat dry. Place 1" Iodoform gauze [place] # [phone]) into the wound. Do not pack tight. Cover with 4 x 4 and secure with sacral foam dressing. Change Iodoform BID.  Monitor the wound area(s) for worsening of condition such as: Signs/symptoms of infection, increase in size, development of or worsening of odor, development of pain, or increased pain at the affected locations.   Notify the medical team if any of these develop.  Thank you for the consult. WOC nurse will not follow at this time.   Please re-consult the WOC team if needed.  Renaldo Reel Katrinka Blazing, MSN, RN, CMSRN, Angus Seller, [facility] Wound Treatment Associate Pager [phone]

## 2021-03-14 NOTE — NC FL2 (Signed)
[place] MEDICAID FL2 LEVEL OF CARE SCREENING TOOL     IDENTIFICATION  Patient Name: Jeffery Christian Birthdate: 1951-08-04 Sex: male Admission Date (Current Location): 03/11/2021  [facility] and [place] Number:  Producer, television/film/video and Address:  [facility] [place], 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   Normochromic normocytic anemia 12/14/2020   Urinary retention 11/18/2020   Neurocognitive deficits 11/18/2020   Acute renal failure superimposed on stage 3 chronic kidney disease (HCC) 11/09/2020   Acute metabolic encephalopathy 11/09/2020   Hypernatremia 11/09/2020   Elevated troponin 11/09/2020   COVID-19 virus infection 11/09/2020   Hypertension 01/07/2018   Atrial fibrillation with rapid ventricular response (HCC) 08/05/2017   CKD (chronic kidney disease), stage III (HCC) 08/05/2017   Persistent atrial fibrillation (HCC)    Chronic systolic CHF (congestive heart failure) (HCC)    Dilated cardiomyopathy (HCC)    Shortness of breath    Gout 07/18/2017   Left buttock abscess 03/22/2014    Orientation RESPIRATION BLADDER Height & Weight     Self, Place  Normal Incontinent, External catheter Weight: 192 lb 10.9 oz (87.4 kg) Height:  6' (182.9 cm)  BEHAVIORAL SYMPTOMS/MOOD NEUROLOGICAL BOWEL NUTRITION STATUS      Incontinent Diet (Please see DC  Summary)  AMBULATORY STATUS COMMUNICATION OF NEEDS Skin   Extensive Assist Verbally Other (Comment) (scratch w/ foam dressing)                       Personal Care Assistance Level of Assistance  Bathing, Feeding, Dressing Bathing Assistance: Limited assistance Feeding assistance: Limited assistance Dressing Assistance: Limited assistance     Functional Limitations Info             SPECIAL CARE FACTORS FREQUENCY  PT (By licensed PT), OT (By licensed OT)     PT Frequency: 3x/week OT Frequency: 3x/week            Contractures Contractures Info: Not present    Additional Factors Info  Code Status, Allergies, Psychotropic Code Status Info: 5x/week Allergies Info: 5x/week Psychotropic Info: Depakote         Current Medications (03/14/2021):  This is the current hospital active medication list Current Facility-Administered Medications  Medication Dose Route Frequency Provider Last Rate Last Admin   (feeding supplement) PROSource Plus liquid 30 mL  30 mL Oral BID Orland Mustard, MD   30 mL at 03/14/21 0940   allopurinol (ZYLOPRIM) tablet 150 mg  150 mg Oral Daily Orland Mustard, MD   150 mg at 03/14/21 0940   apixaban (ELIQUIS) tablet 5 mg  5 mg Oral BID Orland Mustard, MD   5 mg at 03/14/21 0940   ascorbic acid (VITAMIN C) tablet 500 mg  500 mg Oral BID Orland Mustard, MD   500 mg at 03/14/21 0940   atorvastatin (LIPITOR) tablet 20 mg  20  mg Oral QPM Orland Mustard, MD   20 mg at 03/13/21 1704   cefTRIAXone (ROCEPHIN) 2 g in sodium chloride 0.9 % 100 mL IVPB  2 g Intravenous Q24H Doran Stabler, DO 200 mL/hr at 03/14/21 1409 2 g at 03/14/21 1409   Chlorhexidine Gluconate Cloth 2 % PADS 6 each  6 each Topical Daily Orland Mustard, MD   6 each at 03/14/21 0941   colchicine tablet 0.3 mg  0.3 mg Oral Daily Susa Raring K, MD   0.3 mg at 03/14/21 0940   diltiazem (CARDIZEM) tablet 60 mg  60 mg Oral Q8H Leroy Sea, MD   60 mg at 03/14/21 1405   divalproex  (DEPAKOTE) DR tablet 250 mg  250 mg Oral BID Orland Mustard, MD   250 mg at 03/14/21 0940   feeding supplement (ENSURE ENLIVE / ENSURE PLUS) liquid 237 mL  237 mL Oral BID BM Orland Mustard, MD   237 mL at 03/14/21 1530   ferrous sulfate tablet 325 mg  325 mg Oral BID WC Orland Mustard, MD   325 mg at 03/14/21 0940   metroNIDAZOLE (FLAGYL) tablet 500 mg  500 mg Oral Q8H Doran Stabler, DO   500 mg at 03/14/21 1405   midodrine (PROAMATINE) tablet 5 mg  5 mg Oral TID WC Leroy Sea, MD   5 mg at 03/14/21 1405   pantoprazole (PROTONIX) EC tablet 40 mg  40 mg Oral Daily Orland Mustard, MD   40 mg at 03/14/21 0941   sodium chloride flush (NS) 0.9 % injection 10-40 mL  10-40 mL Intracatheter PRN Orland Mustard, MD       tamsulosin [facility]) capsule 0.4 mg  0.4 mg Oral QPC supper Orland Mustard, MD   0.4 mg at 03/13/21 1704   traMADol (ULTRAM) tablet 25 mg  25 mg Oral BID Orland Mustard, MD   25 mg at 03/14/21 0940     Discharge Medications: Please see discharge summary for a list of discharge medications.  Relevant Imaging Results:  Relevant Lab Results:   Additional Information SSN: [id number]. Unvaccinated. Resume IV Rocephin  Mearl Latin, LCSW

## 2021-03-14 NOTE — Progress Notes (Signed)
Pharmacy Antibiotic Note  Jeffery Christian is a 70 y.o. male admitted on 03/11/2021 with  wound infection/osteomyelitis .  Pharmacy has been consulted for Cefepime and vancomycin dosing. ID is seeing patient. Scr stable at 1.06 and WBC 7.9   Plan: -Continue Cefepime 2 gm IV Q 8 hours -Continue Vancomycin 1 gm IV Q 12 hours -Monitor CBC, renal fx, cultures and clinical progress -Vanc levels as indicated  -Continuing metronidazole for anaerobic coverage -f/u deescalation plan   Height: 6' (182.9 cm) Weight: 87.4 kg (192 lb 10.9 oz) IBW/kg (Calculated) : 77.6  Temp (24hrs), Avg:98.4 F (36.9 C), Min:97.9 F (36.6 C), Max:99.3 F (37.4 C)  Recent Labs  Lab 03/11/21 1350 03/11/21 2344 03/12/21 0310 03/13/21 0435 03/14/21 0405  WBC 11.2*  --  9.6 8.2 7.9  CREATININE 1.28*  --  1.13 1.00 1.06  LATICACIDVEN 2.6* 1.3  --   --   --      Estimated Creatinine Clearance: 73.2 mL/min (by C-G formula based on SCr of 1.06 mg/dL).    No Known Allergies  Antimicrobials this admission: Cefepime 6/10 >>  Vancomycin 6/10 >>  Metronidazole 6/10 >>   Dose adjustments this admission:   Microbiology results: 6/10 BCx: NGTD 6/10 UCx:  insignificant growth   Jeffery Christian, PharmD, BCPS, FNKF Clinical Pharmacist [place] Please utilize Amion for appropriate phone number to reach the unit pharmacist [facility])

## 2021-03-14 NOTE — Progress Notes (Signed)
PROGRESS NOTE                                                                                                                                                                                                             Patient Demographics:    Jeffery Christian, is a 70 y.o. male, DOB - 24-Dec-1950, HQR:[id number]  Outpatient Primary MD for the patient is Hoy Register, MD    LOS - 3  Admit date - 03/11/2021    Chief Complaint  Patient presents with   Altered Mental Status       Brief Narrative (HPI from H&P)  Jeffery Christian is a 70 y.o. male with medical history significant of atrial fibrillation, CKD stage 3, HTN, prediabetes, gout, sacral osteomyelitis and decubitus ulcer followed by ID and currently on flagyl and rocephin x 6 week course (started in may) presented from ED secondary to AMS, he was diagnosed with sepsis with source likely being UTI versus his sacral decubitus ulcer, he was admitted for further treatment.   Subjective:   Patient in bed, appears comfortable, denies any headache, no fever, no chest pain or pressure, no shortness of breath , no abdominal pain. No new focal weakness.   Assessment  & Plan :     Sepsis secondary to UTI, sacral decubitus ulcer and sacral myelitis already present on admission - he has sacral osteomyelitis along with sacral decubitus ulcer for which he is on 6 weeks of IV antibiotics going through PICC line by ID clinic started sometime in May.  Presently his decubitus ulcer appears quite healthy pictured below, present episode appears to be UTI related, case discussed with ID, defer antibiotic management to ID will continue to monitor cultures.  2.  Paroxysmal A. fib with RVR [place] vas 2 score of greater than 3.  Initially on Cardizem drip currently on oral Cardizem along with Eliquis rate under control.  3.  CKD stage III with mild AKI.  Resolved after hydration.  4.  Chronic hypotension.   On home dose midodrine.  5.  Diastolic CHF EF 60% on recent echocardiogram.  Currently dehydrated hydrate and monitor.  Holding home dose Lasix.  6.  History of gout.  Continue allopurinol.  7. Toxic encephalopathy due to #1 above.  Much improved. ? if Patient if he has some baseline early dementia.  8.  Chronic weakness and deconditioning.  Lives at nursing home and mostly bed and wheelchair bound.  Continue supportive care.  Lab Results  Component Value Date   HGBA1C 5.3 03/11/2021         Condition - Extremely Guarded  Family Communication  :  number listed for daughter disconnected  Code Status :  DNR - per patient  Consults  :  ID  PUD Prophylaxis : PPI   Procedures  :            Disposition Plan  :    Status is: Inpatient  Remains inpatient appropriate because:IV treatments appropriate due to intensity of illness or inability to take PO  Dispo: The patient is from: Home              Anticipated d/c is to: Home              Patient currently is not medically stable to d/c.   Difficult to place patient No  DVT Prophylaxis  :   apixaban (ELIQUIS) tablet 5 mg    Lab Results  Component Value Date   PLT 239 03/14/2021    Diet :  Diet Order             Diet Heart Room service appropriate? Yes; Fluid consistency: Thin  Diet effective now                    Inpatient Medications  Scheduled Meds:  (feeding supplement) PROSource Plus  30 mL Oral BID   allopurinol  150 mg Oral Daily   apixaban  5 mg Oral BID   vitamin C  500 mg Oral BID   atorvastatin  20 mg Oral QPM   Chlorhexidine Gluconate Cloth  6 each Topical Daily   colchicine  0.3 mg Oral Daily   diltiazem  60 mg Oral Q8H   divalproex  250 mg Oral BID   feeding supplement  237 mL Oral BID BM   ferrous sulfate  325 mg Oral BID WC   midodrine  5 mg Oral TID WC   pantoprazole  40 mg Oral Daily   tamsulosin  0.4 mg Oral QPC supper   traMADol  25 mg Oral BID   Continuous Infusions:   ceFEPime (MAXIPIME) IV 2 g (03/14/21 0525)   metronidazole 500 mg (03/14/21 0648)   vancomycin 1,000 mg (03/14/21 0556)   PRN Meds:.sodium chloride flush  Antibiotics  :    Anti-infectives (From admission, onward)    Start     Dose/Rate Route Frequency Ordered Stop   03/12/21 0500  vancomycin (VANCOREADY) IVPB 1000 mg/200 mL        1,000 mg 200 mL/hr over 60 Minutes Intravenous Every 12 hours 03/11/21 1551     03/11/21 2200  ceFEPIme (MAXIPIME) 2 g in sodium chloride 0.9 % 100 mL IVPB  Status:  Discontinued        2 g 200 mL/hr over 30 Minutes Intravenous Every 8 hours 03/11/21 1851 03/11/21 1856   03/11/21 1530  ceFEPIme (MAXIPIME) 2 g in sodium chloride 0.9 % 100 mL IVPB        2 g 200 mL/hr over 30 Minutes Intravenous Every 8 hours 03/11/21 1519     03/11/21 1530  vancomycin (VANCOREADY) IVPB 1750 mg/350 mL        1,750 mg 175 mL/hr over 120 Minutes Intravenous  Once 03/11/21 1519 03/11/21 1826   03/11/21 1500  metroNIDAZOLE (FLAGYL) IVPB 500 mg  500 mg 100 mL/hr over 60 Minutes Intravenous Every 8 hours 03/11/21 1433          Time Spent in minutes  30   Susa Raring M.D on 03/14/2021 at 10:56 AM  To page go to [email or web address]   [facility] -  Office  [phone]    See all Orders from today for further details    Objective:   Vitals:   03/14/21 0100 03/14/21 0300 03/14/21 0750 03/14/21 0831  BP:  107/72 108/81 108/81  Pulse: 94 (!) 103 (!) 107 95  Resp: 15 17 16 19   Temp:  98.3 F (36.8 C) 98.1 F (36.7 C) 98.1 F (36.7 C)  TempSrc:  Oral Oral Oral  SpO2: 100% 100% 99% 97%  Weight:      Height:        Wt Readings from Last 3 Encounters:  03/12/21 87.4 kg  01/25/21 84.5 kg  01/21/21 84.2 kg     Intake/Output Summary (Last 24 hours) at 03/14/2021 1056 Last data filed at 03/14/2021 0900 Gross per 24 hour  Intake 1440.03 ml  Output 2550 ml  Net -1109.97 ml     Physical Exam  Awake and mildly confused, this could be his baseline,  No new F.N deficits, Normal affect [place].AT,PERRAL Supple Neck,No JVD, No cervical lymphadenopathy appriciated.  Symmetrical Chest wall movement, Good air movement bilaterally, CTAB RRR,No Gallops, Rubs or new Murmurs, No Parasternal Heave +ve B.Sounds, Abd Soft, No tenderness, No organomegaly appriciated, No rebound - guarding or rigidity. chronic 1+ leg edema  Pressure Injury Buttocks Right Unstageable - Full thickness tissue loss in which the base of the injury is covered by slough (yellow, tan, gray, green or brown) and/or eschar (tan, brown or black) in the wound bed. (Active)     Location: Buttocks  Location Orientation: Right  Staging: Unstageable - Full thickness tissue loss in which the base of the injury is covered by slough (yellow, tan, gray, green or brown) and/or eschar (tan, brown or black) in the wound bed.  Wound Description (Comments):   Present on Admission: Yes         Data Review:    CBC Recent Labs  Lab 03/11/21 1350 03/12/21 0310 03/13/21 0435 03/14/21 0405  WBC 11.2* 9.6 8.2 7.9  HGB 12.3* 10.4* 9.9* 9.6*  HCT 38.8* 33.3* 31.4* 31.2*  PLT 283 224 210 239  MCV 84.2 84.1 84.6 85.0  MCH 26.7 26.3 26.7 26.2  MCHC 31.7 31.2 31.5 30.8  RDW 19.8* 19.4* 19.3* 19.3*  LYMPHSABS 1.6 0.5* 1.5 1.5  MONOABS 1.1* 1.0 0.9 0.7  EOSABS 0.0 0.0 0.2 0.2  BASOSABS 0.0 0.0 0.0 0.0    Recent Labs  Lab 03/11/21 0000 03/11/21 1350 03/11/21 2344 03/12/21 0310 03/13/21 0435 03/14/21 0405  NA  --  139  --  136 141 140  K  --  4.5  --  4.1 3.6 3.6  CL  --  101  --  101 105 106  CO2  --  28  --  25 27 26   GLUCOSE  --  172*  --  256* 102* 106*  BUN  --  21  --  20 18 19   CREATININE  --  1.28*  --  1.13 1.00 1.06  CALCIUM  --  8.9  --  8.5* 8.5* 8.4*  AST  --  13*  --   --  12* 12*  ALT  --  8  --   --  8 6  ALKPHOS  --  53  --   --  40 41  BILITOT  --  0.6  --   --  0.6 0.6  ALBUMIN  --  2.6*  --   --  1.9* 1.9*  MG  --   --   --   --  1.6* 2.2  CRP  --   --   14.8*  --  13.6* 12.3*  PROCALCITON  --   --   --  0.26 0.43 0.32  LATICACIDVEN  --  2.6* 1.3  --   --   --   INR  --  1.7*  --   --   --   --   TSH  --   --  6.343*  --   --   --   HGBA1C  --   --  5.3  --   --   --   AMMONIA  --   --  10  --   --   --   BNP 44.4  --   --   --  62.0 97.8    ------------------------------------------------------------------------------------------------------------------ No results for input(s): CHOL, HDL, LDLCALC, TRIG, CHOLHDL, LDLDIRECT in the last 72 hours.  Lab Results  Component Value Date   HGBA1C 5.3 03/11/2021   ------------------------------------------------------------------------------------------------------------------ Recent Labs    03/11/21 2344  TSH 6.343*    Cardiac Enzymes No results for input(s): CKMB, TROPONINI, MYOGLOBIN in the last 168 hours.  Invalid input(s): CK ------------------------------------------------------------------------------------------------------------------    Component Value Date/Time   BNP 97.8 03/14/2021 0405    Micro Results Recent Results (from the past 240 hour(s))  Culture, blood (routine x 2)     Status: None (Preliminary result)   Collection Time: 03/11/21  1:33 PM   Specimen: BLOOD RIGHT HAND  Result Value Ref Range Status   Specimen Description BLOOD RIGHT HAND  Final   Special Requests   Final    BOTTLES DRAWN AEROBIC AND ANAEROBIC Blood Culture results may not be optimal due to an inadequate volume of blood received in culture bottles   Culture   Final    NO GROWTH 3 DAYS Performed at [facility], [address] [place], [place] [address]    Report Status PENDING  Incomplete  Culture, blood (routine x 2)     Status: None (Preliminary result)   Collection Time: 03/11/21  1:38 PM   Specimen: BLOOD LEFT HAND  Result Value Ref Range Status   Specimen Description BLOOD LEFT HAND  Final   Special Requests   Final    BOTTLES DRAWN AEROBIC AND ANAEROBIC Blood Culture  adequate volume   Culture   Final    NO GROWTH 3 DAYS Performed at [facility], [address] [place], [place] [address]    Report Status PENDING  Incomplete  Urine culture     Status: Abnormal   Collection Time: 03/11/21  2:28 PM   Specimen: Urine, Random  Result Value Ref Range Status   Specimen Description URINE, RANDOM  Final   Special Requests NONE  Final   Culture (A)  Final    <10,000 COLONIES/mL INSIGNIFICANT GROWTH Performed at [facility], [address] [place], [place] [address]    Report Status 03/12/2021 FINAL  Final  Resp Panel by RT-PCR (Flu A&B, Covid) Nasopharyngeal Swab     Status: None   Collection Time: 03/11/21  3:05 PM   Specimen: Nasopharyngeal Swab; Nasopharyngeal(NP) swabs in vial transport medium  Result  Value Ref Range Status   SARS Coronavirus 2 by RT PCR NEGATIVE NEGATIVE Final    Comment: (NOTE) SARS-CoV-2 target nucleic acids are NOT DETECTED.  The SARS-CoV-2 RNA is generally detectable in upper respiratory specimens during the acute phase of infection. The lowest concentration of SARS-CoV-2 viral copies this assay can detect is 138 copies/mL. A negative result does not preclude SARS-Cov-2 infection and should not be used as the sole basis for treatment or other patient management decisions. A negative result may occur with  improper specimen collection/handling, submission of specimen other than nasopharyngeal swab, presence of viral mutation(s) within the areas targeted by this assay, and inadequate number of viral copies(<138 copies/mL). A negative result must be combined with clinical observations, patient history, and epidemiological information. The expected result is Negative.  Fact Sheet for Patients:  BloggerCourse.com  Fact Sheet for Healthcare Providers:  SeriousBroker.it  This test is no t yet approved or cleared by the [place] FDA and  has been authorized  for detection and/or diagnosis of SARS-CoV-2 by FDA under an Emergency Use Authorization (EUA). This EUA will remain  in effect (meaning this test can be used) for the duration of the COVID-19 declaration under Section 564(b)(1) of the Act, 21 U.S.C.section 360bbb-3(b)(1), unless the authorization is terminated  or revoked sooner.       Influenza A by PCR NEGATIVE NEGATIVE Final   Influenza B by PCR NEGATIVE NEGATIVE Final    Comment: (NOTE) The Xpert Xpress SARS-CoV-2/FLU/RSV plus assay is intended as an aid in the diagnosis of influenza from Nasopharyngeal swab specimens and should not be used as a sole basis for treatment. Nasal washings and aspirates are unacceptable for Xpert Xpress SARS-CoV-2/FLU/RSV testing.  Fact Sheet for Patients: BloggerCourse.com  Fact Sheet for Healthcare Providers: SeriousBroker.it  This test is not yet approved or cleared by the [place] FDA and has been authorized for detection and/or diagnosis of SARS-CoV-2 by FDA under an Emergency Use Authorization (EUA). This EUA will remain in effect (meaning this test can be used) for the duration of the COVID-19 declaration under Section 564(b)(1) of the Act, 21 U.S.C. section 360bbb-3(b)(1), unless the authorization is terminated or revoked.  Performed at [facility], [address], [place], [place] [address]   MRSA PCR Screening     Status: None   Collection Time: 03/12/21  5:35 AM   Specimen: Nasal Mucosa; Nasopharyngeal  Result Value Ref Range Status   MRSA by PCR NEGATIVE NEGATIVE Final    Comment:        The GeneXpert MRSA Assay (FDA approved for NASAL specimens only), is one component of a comprehensive MRSA colonization surveillance program. It is not intended to diagnose MRSA infection nor to guide or monitor treatment for MRSA infections. Performed at [facility], [address] [place], [place] [address]      Radiology Reports DG Chest 2 View  Addendum Date: 03/11/2021   ADDENDUM REPORT: 03/11/2021 23:21 ADDENDUM: Left PICC line is in place with the tip in the SVC. Electronically Signed   By: Charlett Nose M.D.   On: 03/11/2021 23:21   Result Date: 03/11/2021 CLINICAL DATA:  Possible sepsis EXAM: CHEST - 2 VIEW COMPARISON:  03/11/2021 FINDINGS: Cardiomegaly. Low lung volumes with bibasilar atelectasis. No confluent opacities, effusions or edema. IMPRESSION: Low lung volumes with bibasilar atelectasis. Cardiomegaly. Electronically Signed: By: Charlett Nose M.D. On: 03/11/2021 20:02   DG Chest Port 1 View  Result Date: 03/12/2021 CLINICAL DATA:  Shortness of breath, history of CHF EXAM: PORTABLE CHEST 1 VIEW COMPARISON:  Prior chest x-ray 03/11/2021 FINDINGS: Left upper extremity PICC. Catheter tip overlies the mid SVC. Stable cardiomegaly and mediastinal widening likely representing vascular congestion. No focal airspace consolidation, pleural effusion, pulmonary edema or pneumothorax. Mild vascular congestion is similar compared to prior. No acute osseous abnormality. IMPRESSION: Stable chest x-ray without evidence of acute cardiopulmonary process. Electronically Signed   By: Malachy Moan M.D.   On: 03/12/2021 10:18   DG Chest Port 1 View  Result Date: 03/11/2021 CLINICAL DATA:  Question sepsis.  Altered mental status. EXAM: PORTABLE CHEST 1 VIEW COMPARISON:  11/11/2020 FINDINGS: Cardiomegaly. Aortic atherosclerosis and tortuosity. Pulmonary vascularity is normal. The right lung is clear. Question mild atelectasis or infiltrate at the medial left base. Suggest lateral chest radiography when able. No effusion. IMPRESSION: Cardiomegaly and aortic atherosclerosis. Question mild volume loss or infiltrate at the medial left base. This could be better evaluated with lateral chest radiography when able. Electronically Signed   By: Paulina Fusi M.D.   On: 03/11/2021 15:10

## 2021-03-15 LAB — C-REACTIVE PROTEIN: CRP: 8 mg/dL — ABNORMAL HIGH (ref <1.0)

## 2021-03-15 LAB — COMPREHENSIVE METABOLIC PANEL
ALT: 9 U/L (ref 0–44)
AST: 14 U/L — ABNORMAL LOW (ref 15–41)
Albumin: 2 g/dL — ABNORMAL LOW (ref 3.5–5.0)
Alkaline Phosphatase: 42 U/L (ref 38–126)
Anion gap: 8 (ref 5–15)
BUN: 23 mg/dL (ref 8–23)
CO2: 26 mmol/L (ref 22–32)
Calcium: 8.5 mg/dL — ABNORMAL LOW (ref 8.9–10.3)
Chloride: 106 mmol/L (ref 98–111)
Creatinine, Ser: 1.16 mg/dL (ref 0.61–1.24)
GFR, Estimated: >60 mL/min (ref >60)
Glucose, Bld: 105 mg/dL — ABNORMAL HIGH (ref 70–99)
Potassium: 3.6 mmol/L (ref 3.5–5.1)
Sodium: 140 mmol/L (ref 135–145)
Total Bilirubin: 0.5 mg/dL (ref 0.3–1.2)
Total Protein: 5.1 g/dL — ABNORMAL LOW (ref 6.5–8.1)

## 2021-03-15 LAB — CBC WITH DIFFERENTIAL/PLATELET
Abs Immature Granulocytes: 0.03 10*3/uL (ref 0.00–0.07)
Basophils Absolute: 0 10*3/uL (ref 0.0–0.1)
Basophils Relative: 0 %
Eosinophils Absolute: 0.1 10*3/uL (ref 0.0–0.5)
Eosinophils Relative: 2 %
HCT: 31.7 % — ABNORMAL LOW (ref 39.0–52.0)
Hemoglobin: 9.9 g/dL — ABNORMAL LOW (ref 13.0–17.0)
Immature Granulocytes: 0 %
Lymphocytes Relative: 18 %
Lymphs Abs: 1.6 10*3/uL (ref 0.7–4.0)
MCH: 26.3 pg (ref 26.0–34.0)
MCHC: 31.2 g/dL (ref 30.0–36.0)
MCV: 84.3 fL (ref 80.0–100.0)
Monocytes Absolute: 0.9 10*3/uL (ref 0.1–1.0)
Monocytes Relative: 10 %
Neutro Abs: 5.9 10*3/uL (ref 1.7–7.7)
Neutrophils Relative %: 70 %
Platelets: 262 10*3/uL (ref 150–400)
RBC: 3.76 MIL/uL — ABNORMAL LOW (ref 4.22–5.81)
RDW: 19.2 % — ABNORMAL HIGH (ref 11.5–15.5)
WBC: 8.6 10*3/uL (ref 4.0–10.5)
nRBC: 0 % (ref 0.0–0.2)

## 2021-03-15 LAB — BRAIN NATRIURETIC PEPTIDE: B Natriuretic Peptide: 99.5 pg/mL (ref 0.0–100.0)

## 2021-03-15 LAB — PROCALCITONIN: Procalcitonin: 0.18 ng/mL

## 2021-03-15 LAB — MAGNESIUM: Magnesium: 1.9 mg/dL (ref 1.7–2.4)

## 2021-03-15 MED ORDER — MIDODRINE HCL 5 MG PO TABS
10.0000 mg | ORAL_TABLET | Freq: Three times a day (TID) | ORAL | Status: DC
  Administered 2021-03-15 – 2021-03-16 (×3): 10 mg via ORAL
  Filled 2021-03-15 (×3): qty 2

## 2021-03-15 MED ORDER — LACTATED RINGERS IV BOLUS
1000.0000 mL | Freq: Once | INTRAVENOUS | Status: AC
  Administered 2021-03-15: 1000 mL via INTRAVENOUS

## 2021-03-15 NOTE — Progress Notes (Signed)
PHARMACY CONSULT NOTE FOR:  OUTPATIENT  PARENTERAL ANTIBIOTIC THERAPY (OPAT)  Indication: Sacral osteomyelitis Regimen: Ceftriaxone 2 gm IV Q 24 hours + Flagyl 500 mg PO Q 8 hours  End date: 03/25/21  IV antibiotic discharge orders are pended. To discharging provider:  please sign these orders via discharge navigator,  Select New Orders & click on the button choice - Manage This Unsigned Work.     Thank you for allowing pharmacy to be a part of this patient's care.  Sharin Mons, PharmD, BCPS, BCIDP Infectious Diseases Clinical Pharmacist Phone: [phone] 03/15/2021, 8:19 AM

## 2021-03-15 NOTE — Progress Notes (Addendum)
PT Cancellation Note  Patient Details Name: Jeffery Christian MRN: [id number] DOB: April 25, 1951   Cancelled Treatment:    Reason Eval/Treat Not Completed: Other (comment) per chart, looks to be likely discharging to SNF today. Holding for today per dept protcol, will continue to follow.   Madelaine Etienne, DPT, PN1   Supplemental Physical Therapist [facility]    Pager [phone] Acute Rehab Office [phone]

## 2021-03-15 NOTE — Progress Notes (Signed)
PROGRESS NOTE                                                                                                                                                                                                             Patient Demographics:    Jeffery Christian, is a 70 y.o. male, DOB - 1951-03-17, DHR:[id number]  Outpatient Primary MD for the patient is Hoy Register, MD    LOS - 4  Admit date - 03/11/2021    Chief Complaint  Patient presents with   Altered Mental Status       Brief Narrative (HPI from H&P)  Jeffery Christian is a 70 y.o. male with medical history significant of atrial fibrillation, CKD stage 3, HTN, prediabetes, gout, sacral osteomyelitis and decubitus ulcer followed by ID and currently on flagyl and rocephin x 6 week course (started in may) presented from ED secondary to AMS, he was diagnosed with sepsis with source likely being UTI versus his sacral decubitus ulcer, he was admitted for further treatment.   Subjective:   Patient in bed appears to be in no distress denies any headache chest or abdominal pain, no shortness of breath.   Assessment  & Plan :     Sepsis secondary to UTI, sacral decubitus ulcer and sacral myelitis already present on admission - he has sacral osteomyelitis along with sacral decubitus ulcer for which he is on 6 weeks of IV antibiotics going through PICC line by ID clinic started sometime in May.  Presently his decubitus ulcer appears quite healthy pictured below, present episode appears to be UTI related, case discussed with ID, continue present antibiotics clinically more stable likely discharge back to SNF in the next 1 to 2 days.  Note he is already on 6 weeks of Rocephin and Flagyl for his osteomyelitis via PICC line which will be continued upon discharge.  2.  Paroxysmal A. fib with RVR [place] vas 2 score of greater than 3.  Initially on Cardizem drip currently on oral Cardizem along with  Eliquis rate under control.  Blood pressure slightly soft today will increase midodrine dose and a liter of IV fluids.  3.  CKD stage III with mild AKI.  Resolved after hydration.  4.  Chronic hypotension.  On home dose midodrine have increased dose on 03/15/2021, IV fluid bolus again along with TED stockings  to augment blood pressure.  5.  Diastolic CHF EF 60% on recent echocardiogram.  Currently dehydrated, IVF 1 lit again and monitor.  Holding home dose Lasix.  6.  History of gout.  Continue allopurinol.  7. Toxic encephalopathy due to #1 above.  Much improved. ? if Patient if he has some baseline early dementia.  8.  Chronic weakness and deconditioning.  Lives at nursing home and mostly bed and wheelchair bound.  Continue supportive care.  Lab Results  Component Value Date   HGBA1C 5.3 03/11/2021         Condition - Extremely Guarded  Family Communication  :  number listed for daughter disconnected  Code Status :  DNR - per patient  Consults  :  ID  PUD Prophylaxis : PPI   Procedures  :            Disposition Plan  :    Status is: Inpatient  Remains inpatient appropriate because:IV treatments appropriate due to intensity of illness or inability to take PO  Dispo: The patient is from: Home              Anticipated d/c is to: Home              Patient currently is not medically stable to d/c.   Difficult to place patient No  DVT Prophylaxis  :   apixaban (ELIQUIS) tablet 5 mg    Lab Results  Component Value Date   PLT 262 03/15/2021    Diet :  Diet Order             Diet Heart Room service appropriate? Yes; Fluid consistency: Thin  Diet effective now                    Inpatient Medications  Scheduled Meds:  (feeding supplement) PROSource Plus  30 mL Oral BID   allopurinol  150 mg Oral Daily   apixaban  5 mg Oral BID   vitamin C  500 mg Oral BID   atorvastatin  20 mg Oral QPM   Chlorhexidine Gluconate Cloth  6 each Topical Daily    colchicine  0.3 mg Oral Daily   diltiazem  60 mg Oral Q8H   divalproex  250 mg Oral BID   feeding supplement  237 mL Oral BID BM   ferrous sulfate  325 mg Oral BID WC   metroNIDAZOLE  500 mg Oral Q8H   midodrine  10 mg Oral TID WC   pantoprazole  40 mg Oral Daily   tamsulosin  0.4 mg Oral QPC supper   traMADol  25 mg Oral BID   Continuous Infusions:  cefTRIAXone (ROCEPHIN)  IV 2 g (03/14/21 1409)   lactated ringers     PRN Meds:.sodium chloride flush  Antibiotics  :    Anti-infectives (From admission, onward)    Start     Dose/Rate Route Frequency Ordered Stop   03/14/21 1400  cefTRIAXone (ROCEPHIN) 2 g in sodium chloride 0.9 % 100 mL IVPB        2 g 200 mL/hr over 30 Minutes Intravenous Every 24 hours 03/14/21 1141     03/14/21 1400  metroNIDAZOLE (FLAGYL) tablet 500 mg        500 mg Oral Every 8 hours 03/14/21 1141     03/12/21 0500  vancomycin (VANCOREADY) IVPB 1000 mg/200 mL  Status:  Discontinued        1,000 mg 200 mL/hr over 60 Minutes Intravenous Every  12 hours 03/11/21 1551 03/14/21 1141   03/11/21 2200  ceFEPIme (MAXIPIME) 2 g in sodium chloride 0.9 % 100 mL IVPB  Status:  Discontinued        2 g 200 mL/hr over 30 Minutes Intravenous Every 8 hours 03/11/21 1851 03/11/21 1856   03/11/21 1530  ceFEPIme (MAXIPIME) 2 g in sodium chloride 0.9 % 100 mL IVPB  Status:  Discontinued        2 g 200 mL/hr over 30 Minutes Intravenous Every 8 hours 03/11/21 1519 03/14/21 1141   03/11/21 1530  vancomycin (VANCOREADY) IVPB 1750 mg/350 mL        1,750 mg 175 mL/hr over 120 Minutes Intravenous  Once 03/11/21 1519 03/11/21 1826   03/11/21 1500  metroNIDAZOLE (FLAGYL) IVPB 500 mg  Status:  Discontinued        500 mg 100 mL/hr over 60 Minutes Intravenous Every 8 hours 03/11/21 1433 03/14/21 1141        Time Spent in minutes  30   Susa Raring M.D on 03/15/2021 at 10:09 AM  To page go to [email or web address]   [facility] -  Office  [phone]    See all Orders from  today for further details    Objective:   Vitals:   03/15/21 0000 03/15/21 0400 03/15/21 0626 03/15/21 0729  BP: 105/74 117/78  106/77  Pulse: 95 (!) 103  (!) 107  Resp: 18 18  20   Temp: 98.2 F (36.8 C) 98.8 F (37.1 C)  97.7 F (36.5 C)  TempSrc: Oral Oral  Oral  SpO2: 97% 97%  99%  Weight:   87.1 kg   Height:        Wt Readings from Last 3 Encounters:  03/15/21 87.1 kg  01/25/21 84.5 kg  01/21/21 84.2 kg     Intake/Output Summary (Last 24 hours) at 03/15/2021 1009 Last data filed at 03/15/2021 0626 Gross per 24 hour  Intake 320.08 ml  Output 3150 ml  Net -2829.92 ml     Physical Exam  Awake and mildly confused, this could be his baseline, No new F.N deficits, Normal affect [place].AT,PERRAL Supple Neck,No JVD, No cervical lymphadenopathy appriciated.  Symmetrical Chest wall movement, Good air movement bilaterally, CTAB RRR,No Gallops, Rubs or new Murmurs, No Parasternal Heave +ve B.Sounds, Abd Soft, No tenderness, No organomegaly appriciated, No rebound - guarding or rigidity. chronic 1+ leg edema  Pressure Injury Buttocks Right Unstageable - Full thickness tissue loss in which the base of the injury is covered by slough (yellow, tan, gray, green or brown) and/or eschar (tan, brown or black) in the wound bed. (Active)     Location: Buttocks  Location Orientation: Right  Staging: Unstageable - Full thickness tissue loss in which the base of the injury is covered by slough (yellow, tan, gray, green or brown) and/or eschar (tan, brown or black) in the wound bed.  Wound Description (Comments):   Present on Admission: Yes         Data Review:    CBC Recent Labs  Lab 03/11/21 1350 03/12/21 0310 03/13/21 0435 03/14/21 0405 03/15/21 0348  WBC 11.2* 9.6 8.2 7.9 8.6  HGB 12.3* 10.4* 9.9* 9.6* 9.9*  HCT 38.8* 33.3* 31.4* 31.2* 31.7*  PLT 283 224 210 239 262  MCV 84.2 84.1 84.6 85.0 84.3  MCH 26.7 26.3 26.7 26.2 26.3  MCHC 31.7 31.2 31.5 30.8 31.2  RDW  19.8* 19.4* 19.3* 19.3* 19.2*  LYMPHSABS 1.6 0.5* 1.5 1.5 1.6  MONOABS 1.1* 1.0  0.9 0.7 0.9  EOSABS 0.0 0.0 0.2 0.2 0.1  BASOSABS 0.0 0.0 0.0 0.0 0.0    Recent Labs  Lab 03/11/21 0000 03/11/21 1350 03/11/21 2344 03/12/21 0310 03/13/21 0435 03/14/21 0405 03/15/21 0348  NA  --  139  --  136 141 140 140  K  --  4.5  --  4.1 3.6 3.6 3.6  CL  --  101  --  101 105 106 106  CO2  --  28  --  25 27 26 26   GLUCOSE  --  172*  --  256* 102* 106* 105*  BUN  --  21  --  20 18 19 23   CREATININE  --  1.28*  --  1.13 1.00 1.06 1.16  CALCIUM  --  8.9  --  8.5* 8.5* 8.4* 8.5*  AST  --  13*  --   --  12* 12* 14*  ALT  --  8  --   --  8 6 9   ALKPHOS  --  53  --   --  40 41 42  BILITOT  --  0.6  --   --  0.6 0.6 0.5  ALBUMIN  --  2.6*  --   --  1.9* 1.9* 2.0*  MG  --   --   --   --  1.6* 2.2 1.9  CRP  --   --  14.8*  --  13.6* 12.3* 8.0*  PROCALCITON  --   --   --  0.26 0.43 0.32 0.18  LATICACIDVEN  --  2.6* 1.3  --   --   --   --   INR  --  1.7*  --   --   --   --   --   TSH  --   --  6.343*  --   --   --   --   HGBA1C  --   --  5.3  --   --   --   --   AMMONIA  --   --  10  --   --   --   --   BNP 44.4  --   --   --  62.0 97.8 99.5    ------------------------------------------------------------------------------------------------------------------ No results for input(s): CHOL, HDL, LDLCALC, TRIG, CHOLHDL, LDLDIRECT in the last 72 hours.  Lab Results  Component Value Date   HGBA1C 5.3 03/11/2021   ------------------------------------------------------------------------------------------------------------------ No results for input(s): TSH, T4TOTAL, T3FREE, THYROIDAB in the last 72 hours.  Invalid input(s): FREET3   Cardiac Enzymes No results for input(s): CKMB, TROPONINI, MYOGLOBIN in the last 168 hours.  Invalid input(s): CK ------------------------------------------------------------------------------------------------------------------    Component Value Date/Time   BNP  99.5 03/15/2021 0348    Micro Results Recent Results (from the past 240 hour(s))  Culture, blood (routine x 2)     Status: None (Preliminary result)   Collection Time: 03/11/21  1:33 PM   Specimen: BLOOD RIGHT HAND  Result Value Ref Range Status   Specimen Description BLOOD RIGHT HAND  Final   Special Requests   Final    BOTTLES DRAWN AEROBIC AND ANAEROBIC Blood Culture results may not be optimal due to an inadequate volume of blood received in culture bottles   Culture   Final    NO GROWTH 4 DAYS Performed at [facility], [address], [place], [facility] [address]    Report Status PENDING  Incomplete  Culture, blood (routine x 2)     Status:  None (Preliminary result)   Collection Time: 03/11/21  1:38 PM   Specimen: BLOOD LEFT HAND  Result Value Ref Range Status   Specimen Description BLOOD LEFT HAND  Final   Special Requests   Final    BOTTLES DRAWN AEROBIC AND ANAEROBIC Blood Culture adequate volume   Culture   Final    NO GROWTH 4 DAYS Performed at [facility], [address], [place], [place] [address]    Report Status PENDING  Incomplete  Urine culture     Status: Abnormal   Collection Time: 03/11/21  2:28 PM   Specimen: Urine, Random  Result Value Ref Range Status   Specimen Description URINE, RANDOM  Final   Special Requests NONE  Final   Culture (A)  Final    <10,000 COLONIES/mL INSIGNIFICANT GROWTH Performed at [facility], [address] [place], [place] [address]    Report Status 03/12/2021 FINAL  Final  Resp Panel by RT-PCR (Flu A&B, Covid) Nasopharyngeal Swab     Status: None   Collection Time: 03/11/21  3:05 PM   Specimen: Nasopharyngeal Swab; Nasopharyngeal(NP) swabs in vial transport medium  Result Value Ref Range Status   SARS Coronavirus 2 by RT PCR NEGATIVE NEGATIVE Final    Comment: (NOTE) SARS-CoV-2 target nucleic acids are NOT DETECTED.  The SARS-CoV-2 RNA is generally detectable in upper respiratory specimens during the  acute phase of infection. The lowest concentration of SARS-CoV-2 viral copies this assay can detect is 138 copies/mL. A negative result does not preclude SARS-Cov-2 infection and should not be used as the sole basis for treatment or other patient management decisions. A negative result may occur with  improper specimen collection/handling, submission of specimen other than nasopharyngeal swab, presence of viral mutation(s) within the areas targeted by this assay, and inadequate number of viral copies(<138 copies/mL). A negative result must be combined with clinical observations, patient history, and epidemiological information. The expected result is Negative.  Fact Sheet for Patients:  BloggerCourse.com  Fact Sheet for Healthcare Providers:  SeriousBroker.it  This test is no t yet approved or cleared by the [place] FDA and  has been authorized for detection and/or diagnosis of SARS-CoV-2 by FDA under an Emergency Use Authorization (EUA). This EUA will remain  in effect (meaning this test can be used) for the duration of the COVID-19 declaration under Section 564(b)(1) of the Act, 21 U.S.C.section 360bbb-3(b)(1), unless the authorization is terminated  or revoked sooner.       Influenza A by PCR NEGATIVE NEGATIVE Final   Influenza B by PCR NEGATIVE NEGATIVE Final    Comment: (NOTE) The Xpert Xpress SARS-CoV-2/FLU/RSV plus assay is intended as an aid in the diagnosis of influenza from Nasopharyngeal swab specimens and should not be used as a sole basis for treatment. Nasal washings and aspirates are unacceptable for Xpert Xpress SARS-CoV-2/FLU/RSV testing.  Fact Sheet for Patients: BloggerCourse.com  Fact Sheet for Healthcare Providers: SeriousBroker.it  This test is not yet approved or cleared by the [place] FDA and has been authorized for detection and/or  diagnosis of SARS-CoV-2 by FDA under an Emergency Use Authorization (EUA). This EUA will remain in effect (meaning this test can be used) for the duration of the COVID-19 declaration under Section 564(b)(1) of the Act, 21 U.S.C. section 360bbb-3(b)(1), unless the authorization is terminated or revoked.  Performed at [facility], [address], [place], [place] [address]   MRSA PCR Screening     Status: None  Collection Time: 03/12/21  5:35 AM   Specimen: Nasal Mucosa; Nasopharyngeal  Result Value Ref Range Status   MRSA by PCR NEGATIVE NEGATIVE Final    Comment:        The GeneXpert MRSA Assay (FDA approved for NASAL specimens only), is one component of a comprehensive MRSA colonization surveillance program. It is not intended to diagnose MRSA infection nor to guide or monitor treatment for MRSA infections. Performed at [facility], [address], [place], [place] [address]   SARS CORONAVIRUS 2 (TAT 6-24 HRS) Nasopharyngeal Nasopharyngeal Swab     Status: None   Collection Time: 03/14/21 10:41 AM   Specimen: Nasopharyngeal Swab  Result Value Ref Range Status   SARS Coronavirus 2 NEGATIVE NEGATIVE Final    Comment: (NOTE) SARS-CoV-2 target nucleic acids are NOT DETECTED.  The SARS-CoV-2 RNA is generally detectable in upper and lower respiratory specimens during the acute phase of infection. Negative results do not preclude SARS-CoV-2 infection, do not rule out co-infections with other pathogens, and should not be used as the sole basis for treatment or other patient management decisions. Negative results must be combined with clinical observations, patient history, and epidemiological information. The expected result is Negative.  Fact Sheet for Patients: HairSlick.no  Fact Sheet for Healthcare Providers: quierodirigir.com  This test is not yet approved or cleared by the [place] FDA and  has  been authorized for detection and/or diagnosis of SARS-CoV-2 by FDA under an Emergency Use Authorization (EUA). This EUA will remain  in effect (meaning this test can be used) for the duration of the COVID-19 declaration under Se ction 564(b)(1) of the Act, 21 U.S.C. section 360bbb-3(b)(1), unless the authorization is terminated or revoked sooner.  Performed at [facility] Lab, [address], [place], [place] [address]     Radiology Reports DG Chest 2 View  Addendum Date: 03/11/2021   ADDENDUM REPORT: 03/11/2021 23:21 ADDENDUM: Left PICC line is in place with the tip in the SVC. Electronically Signed   By: Charlett Nose M.D.   On: 03/11/2021 23:21   Result Date: 03/11/2021 CLINICAL DATA:  Possible sepsis EXAM: CHEST - 2 VIEW COMPARISON:  03/11/2021 FINDINGS: Cardiomegaly. Low lung volumes with bibasilar atelectasis. No confluent opacities, effusions or edema. IMPRESSION: Low lung volumes with bibasilar atelectasis. Cardiomegaly. Electronically Signed: By: Charlett Nose M.D. On: 03/11/2021 20:02   DG Chest Port 1 View  Result Date: 03/12/2021 CLINICAL DATA:  Shortness of breath, history of CHF EXAM: PORTABLE CHEST 1 VIEW COMPARISON:  Prior chest x-ray 03/11/2021 FINDINGS: Left upper extremity PICC. Catheter tip overlies the mid SVC. Stable cardiomegaly and mediastinal widening likely representing vascular congestion. No focal airspace consolidation, pleural effusion, pulmonary edema or pneumothorax. Mild vascular congestion is similar compared to prior. No acute osseous abnormality. IMPRESSION: Stable chest x-ray without evidence of acute cardiopulmonary process. Electronically Signed   By: Malachy Moan M.D.   On: 03/12/2021 10:18   DG Chest Port 1 View  Result Date: 03/11/2021 CLINICAL DATA:  Question sepsis.  Altered mental status. EXAM: PORTABLE CHEST 1 VIEW COMPARISON:  11/11/2020 FINDINGS: Cardiomegaly. Aortic atherosclerosis and tortuosity. Pulmonary vascularity is normal. The  right lung is clear. Question mild atelectasis or infiltrate at the medial left base. Suggest lateral chest radiography when able. No effusion. IMPRESSION: Cardiomegaly and aortic atherosclerosis. Question mild volume loss or infiltrate at the medial left base. This could be better evaluated with lateral chest radiography when able. Electronically Signed   By: Loraine Leriche  Shogry M.D.   On: 03/11/2021 15:10

## 2021-03-16 LAB — CBC WITH DIFFERENTIAL/PLATELET
Abs Immature Granulocytes: 0.05 10*3/uL (ref 0.00–0.07)
Basophils Absolute: 0 10*3/uL (ref 0.0–0.1)
Basophils Relative: 0 %
Eosinophils Absolute: 0.1 10*3/uL (ref 0.0–0.5)
Eosinophils Relative: 1 %
HCT: 31.3 % — ABNORMAL LOW (ref 39.0–52.0)
Hemoglobin: 9.9 g/dL — ABNORMAL LOW (ref 13.0–17.0)
Immature Granulocytes: 1 %
Lymphocytes Relative: 16 %
Lymphs Abs: 1.6 10*3/uL (ref 0.7–4.0)
MCH: 26.7 pg (ref 26.0–34.0)
MCHC: 31.6 g/dL (ref 30.0–36.0)
MCV: 84.4 fL (ref 80.0–100.0)
Monocytes Absolute: 1 10*3/uL (ref 0.1–1.0)
Monocytes Relative: 10 %
Neutro Abs: 7.2 10*3/uL (ref 1.7–7.7)
Neutrophils Relative %: 72 %
Platelets: 273 10*3/uL (ref 150–400)
RBC: 3.71 MIL/uL — ABNORMAL LOW (ref 4.22–5.81)
RDW: 19.3 % — ABNORMAL HIGH (ref 11.5–15.5)
WBC: 10 10*3/uL (ref 4.0–10.5)
nRBC: 0 % (ref 0.0–0.2)

## 2021-03-16 LAB — COMPREHENSIVE METABOLIC PANEL
ALT: 9 U/L (ref 0–44)
AST: 13 U/L — ABNORMAL LOW (ref 15–41)
Albumin: 2 g/dL — ABNORMAL LOW (ref 3.5–5.0)
Alkaline Phosphatase: 41 U/L (ref 38–126)
Anion gap: 9 (ref 5–15)
BUN: 22 mg/dL (ref 8–23)
CO2: 28 mmol/L (ref 22–32)
Calcium: 8.7 mg/dL — ABNORMAL LOW (ref 8.9–10.3)
Chloride: 106 mmol/L (ref 98–111)
Creatinine, Ser: 1.3 mg/dL — ABNORMAL HIGH (ref 0.61–1.24)
GFR, Estimated: 60 mL/min — ABNORMAL LOW (ref >60)
Glucose, Bld: 102 mg/dL — ABNORMAL HIGH (ref 70–99)
Potassium: 3.9 mmol/L (ref 3.5–5.1)
Sodium: 143 mmol/L (ref 135–145)
Total Bilirubin: 0.5 mg/dL (ref 0.3–1.2)
Total Protein: 5 g/dL — ABNORMAL LOW (ref 6.5–8.1)

## 2021-03-16 LAB — CULTURE, BLOOD (ROUTINE X 2)
Culture: NO GROWTH
Culture: NO GROWTH
Special Requests: ADEQUATE

## 2021-03-16 LAB — BRAIN NATRIURETIC PEPTIDE: B Natriuretic Peptide: 86.5 pg/mL (ref 0.0–100.0)

## 2021-03-16 LAB — C-REACTIVE PROTEIN: CRP: 7.7 mg/dL — ABNORMAL HIGH (ref <1.0)

## 2021-03-16 LAB — MAGNESIUM: Magnesium: 1.9 mg/dL (ref 1.7–2.4)

## 2021-03-16 LAB — PROCALCITONIN: Procalcitonin: 0.27 ng/mL

## 2021-03-16 MED ORDER — TRAMADOL HCL 50 MG PO TABS: 25.0000 mg | ORAL_TABLET | Freq: Two times a day (BID) | ORAL | 0 refills | Status: DC

## 2021-03-16 MED ORDER — METRONIDAZOLE 500 MG PO TABS: 500.0000 mg | ORAL_TABLET | Freq: Three times a day (TID) | ORAL | 0 refills | Status: DC

## 2021-03-16 MED ORDER — ATORVASTATIN CALCIUM 20 MG PO TABS: 20.0000 mg | ORAL_TABLET | Freq: Every evening | ORAL | Status: AC

## 2021-03-16 MED ORDER — CEFTRIAXONE IV (FOR PTA / DISCHARGE USE ONLY): 2.0000 g | INTRAVENOUS | 0 refills | Status: AC

## 2021-03-16 MED ORDER — MIDODRINE HCL 10 MG PO TABS: 10.0000 mg | ORAL_TABLET | Freq: Three times a day (TID) | ORAL | Status: DC

## 2021-03-16 MED ORDER — APIXABAN 5 MG PO TABS: 5.0000 mg | ORAL_TABLET | Freq: Two times a day (BID) | ORAL | Status: AC

## 2021-03-16 NOTE — Progress Notes (Signed)
Report called to [facility] 

## 2021-03-16 NOTE — Discharge Instructions (Addendum)
Follow with Primary MD Hoy Register, MD in 7 days   Get CBC, CMP, 2 view Chest X ray -  checked next visit within 1 week by Primary MD    Activity: As tolerated with Full fall precautions use walker/cane & assistance as needed  Disposition SNF  Diet: Heart Healthy with feeding assistance and aspiration precautions.   Special Instructions: If you have smoked or chewed Tobacco  in the last 2 yrs please stop smoking, stop any regular Alcohol  and or any Recreational drug use.  On your next visit with your primary care physician please Get Medicines reviewed and adjusted.  Please request your Prim.MD to go over all Hospital Tests and Procedure/Radiological results at the follow up, please get all Hospital records sent to your Prim MD by signing hospital release before you go home.  If you experience worsening of your admission symptoms, develop shortness of breath, life threatening emergency, suicidal or homicidal thoughts you must seek medical attention immediately by calling 911 or calling your MD immediately  if symptoms less severe.  You Must read complete instructions/literature along with all the possible adverse reactions/side effects for all the Medicines you take and that have been prescribed to you. Take any new Medicines after you have completely understood and accpet all the possible adverse reactions/side effects.

## 2021-03-16 NOTE — Progress Notes (Signed)
Patient was discharged to [facility] via [facility]. Belongings taken with patient. Single-lumen PICC left in place, as patient will have outpatient antibiotic therapy. VS stable.

## 2021-03-16 NOTE — TOC Transition Note (Signed)
Transition of Care [facility]) - CM/SW Discharge Note   Patient Details  Name: Jeffery Christian MRN: [id number] Date of Birth: 02-15-1951  Transition of Care [facility]) CM/SW Contact:  Mearl Latin, LCSW Phone Number: 03/16/2021, 11:17 AM   Clinical Narrative:    Patient will DC to: [facility] Anticipated DC date: 03/16/21 Family notified: Daughter, [place] Transport by: Sharin Mons   Per MD patient ready for DC to [facility]. RN to call report prior to discharge [phone]). RN, patient, patient's family, and facility notified of DC. Discharge Summary and FL2 sent to facility. DC packet on chart. Ambulance transport requested for patient.   CSW will sign off for now as social work intervention is no longer needed. Please consult [place] again if new needs arise.     Final next level of care: Skilled Nursing Facility Barriers to Discharge: Barriers Resolved   Patient Goals and CMS Choice Patient states their goals for this hospitalization and ongoing recovery are:: Return to SNF CMS Medicare.gov Compare Post Acute Care list provided to:: Patient Represenative (must comment) Choice offered to / list presented to : Adult Children  Discharge Placement   Existing PASRR number confirmed : 03/16/21          Patient chooses bed at: [facility] Patient to be transferred to facility by: [facility] Name of family member notified: Daughter Patient and family notified of of transfer: 03/16/21  Discharge Plan and Services In-house Referral: Clinical Social Work   Post Acute Care Choice: Skilled Nursing Facility                               Social Determinants of Health (SDOH) Interventions     Readmission Risk Interventions No flowsheet data found.

## 2021-03-16 NOTE — Plan of Care (Signed)
  Problem: Education: Goal: Knowledge of General Education information will improve Description: Including pain rating scale, medication(s)/side effects and non-pharmacologic comfort measures Outcome: Progressing   Problem: Nutrition: Goal: Adequate nutrition will be maintained Outcome: Progressing   

## 2021-03-16 NOTE — Consult Note (Signed)
[facility] CM Inpatient Consult   03/16/2021  Jeffery Christian [birth year] [id number]  Patient screened for high risk score for unplanned readmission. Chart reviewed to assess for potential [facility] Care Management community service needs. Per review, patient is being discharged to [facility] skilled nursing facility.   No [facility] Care Management follow up needs.  Christophe Louis, MSN, RN [facility], [place] Liaison Nurse Mobile Phone [phone]  Toll free office [phone]

## 2021-03-16 NOTE — Discharge Summary (Signed)
Jeffery Christian LFY:[id number] DOB: 1951/03/23 DOA: 03/11/2021  PCP: Charlott Rakes, MD  Admit date: 03/11/2021  Discharge date: 03/16/2021  Admitted From: SNF   Disposition:  SNF   Recommendations for Outpatient Follow-up:   Follow up with PCP in 1-2 weeks  PCP Please obtain BMP/CBC, 2 view CXR in 1week,  (see Discharge instructions)   PCP Please follow up on the following pending results:    Home Health: None   Equipment/Devices: None  Consultations:ID Discharge Condition: Guarded   CODE STATUS: DNR  Diet Recommendation: Heart Healthy   Diet Order             Diet - low sodium heart healthy           Diet Heart Room service appropriate? Yes; Fluid consistency: Thin  Diet effective now                    Chief Complaint  Patient presents with   Altered Mental Status     Brief history of present illness from the day of admission and additional interim summary     Jeffery Christian is a 70 y.o. male with medical history significant of atrial fibrillation, CKD stage 3, HTN, prediabetes, gout, sacral osteomyelitis and decubitus ulcer followed by ID and currently on flagyl and rocephin x 6 week course (started in may) presented from ED secondary to AMS, he was diagnosed with sepsis with source likely being UTI versus his sacral decubitus ulcer, he was admitted for further treatment.                                                                  Hospital Course    Sepsis secondary to UTI, sacral decubitus ulcer and sacral myelitis already present on admission - he has sacral osteomyelitis along with sacral decubitus ulcer for which he is on 6 weeks of IV antibiotics going through PICC line by ID clinic started sometime in May.  Presently his decubitus ulcer appears quite healthy pictured below, present episode  appears to be UTI related, case discussed with ID, continue present antibiotics clinically more stable likely discharge back to SNF in the next 1 to 2 days.  Note he is already on 6 weeks of Rocephin and Flagyl for his osteomyelitis via PICC line which will be continued upon discharge stop date 03/25/21 - needs to see ID prior to that date.   2.  Paroxysmal A. fib with RVR [place] vas 2 score of greater than 3.  Initially on Cardizem drip currently on oral Cardizem along with Eliquis rate under control.  Blood pressure slightly soft today will increase midodrine dose and a liter of IV fluids.   3.  CKD stage III with mild AKI.  Resolved after hydration.   4.  Chronic hypotension.  On home dose midodrine have increased dose on 03/15/2021, IV fluid bolus again along with TED stockings to augment blood pressure.   5.  Diastolic CHF EF 93% on recent echocardiogram.  Currently dehydrated, IVF 1 lit again and monitor.  Holding home dose Lasix 20 mg/day.  Reevaluate at SNF in a week and resume Lasix if needed.   6.  History of gout.  Continue allopurinol.   7. Toxic encephalopathy due to #1 above.  Much improved. ? if Patient if he has some baseline early dementia.   8.  Chronic weakness and deconditioning.  Lives at nursing home and mostly bed and wheelchair bound.  Continue supportive care.    Discharge diagnosis     Principal Problem:   Sepsis ([place]) Active Problems:   Gout   Chronic systolic CHF (congestive heart failure) (HCC)   Atrial fibrillation with rapid ventricular response (HCC)   CKD (chronic kidney disease), stage III (HCC)   Hypertension   Sacral osteomyelitis ([place])   Prediabetes    Discharge instructions    Discharge Instructions     Diet - low sodium heart healthy   Complete by: As directed    Discharge instructions   Complete by: As directed    Follow with Primary MD Charlott Rakes, MD in 7 days   Get CBC, CMP, 2 view Chest X ray -  checked next visit within 1 week  by Primary MD    Activity: As tolerated with Full fall precautions use walker/cane & assistance as needed  Disposition SNF  Diet: Heart Healthy with feeding assistance and aspiration precautions.   Special Instructions: If you have smoked or chewed Tobacco  in the last 2 yrs please stop smoking, stop any regular Alcohol  and or any Recreational drug use.  On your next visit with your primary care physician please Get Medicines reviewed and adjusted.  Please request your Prim.MD to go over all Hospital Tests and Procedure/Radiological results at the follow up, please get all Hospital records sent to your Prim MD by signing hospital release before you go home.  If you experience worsening of your admission symptoms, develop shortness of breath, life threatening emergency, suicidal or homicidal thoughts you must seek medical attention immediately by calling 911 or calling your MD immediately  if symptoms less severe.  You Must read complete instructions/literature along with all the possible adverse reactions/side effects for all the Medicines you take and that have been prescribed to you. Take any new Medicines after you have completely understood and accpet all the possible adverse reactions/side effects.   Discharge wound care:   Complete by: As directed    Every shift  -  Clean the sacrum with soap and water. Pat dry. Place 1" Iodoform gauze into the wound leaving a tail for ease of removal. Do not pack tight. Cover with 4 x 4 and secure with sacral foam dressing. Change Iodoform BID.   Home infusion instructions   Complete by: As directed    Instructions: Flushing of vascular access device: 0.9% NaCl pre/post medication administration and prn patency; Heparin 100 u/ml, 36ml for implanted ports and Heparin 10u/ml, 27ml for all other central venous catheters.   Increase activity slowly   Complete by: As directed        Discharge Medications   Allergies as of 03/16/2021   No Known  Allergies      Medication List     STOP taking these medications    cefTRIAXone 2 g in dextrose  5 % 50 mL   furosemide 20 MG tablet Commonly known as: LASIX       TAKE these medications    allopurinol 300 MG tablet Commonly known as: ZYLOPRIM Take 150 mg by mouth daily.   apixaban 5 MG Tabs tablet Commonly known as: ELIQUIS Take 1 tablet (5 mg total) by mouth 2 (two) times daily.   atorvastatin 20 MG tablet Commonly known as: LIPITOR Take 1 tablet (20 mg total) by mouth every evening.   cefTRIAXone  IVPB Commonly known as: ROCEPHIN Inject 2 g into the vein daily for 10 days. Indication:  Sacral osteomyelitis First Dose: Yes Last Day of Therapy:  03/25/21 Labs - Once weekly:  CBC/D and BMP, Labs - Every other week:  ESR and CRP Method of administration: IV Push Method of administration may be changed at the discretion of home infusion pharmacist based upon assessment of the patient and/or caregiver's ability to self-administer the medication ordered.   colchicine 0.6 MG tablet Take 0.6 mg by mouth See admin instructions. GIVE 2 CAPSULES=1.2 MG PO AT FIRST SIGN OF GOUT FLARE UP AND REPEAT 0.6 MG IN 1 HOUR IF SYMPTOMS PERSIST  TAKE 1 CAPSULE BY MOUTH 1 HOUR AFTER TAKING 2 CAPSULE IF GOUT SYMPTOMS HAVE NOT SUBSIDED   diltiazem 60 MG tablet Commonly known as: CARDIZEM Take 60 mg by mouth every 8 (eight) hours.   divalproex 125 MG DR tablet Commonly known as: DEPAKOTE Take 250 mg by mouth 2 (two) times daily.   feeding supplement Liqd Take 237 mLs by mouth 2 (two) times daily between meals.   ferrous sulfate 325 (65 FE) MG tablet Take 325 mg by mouth 2 (two) times daily with a meal.   metroNIDAZOLE 500 MG tablet Commonly known as: FLAGYL Take 1 tablet (500 mg total) by mouth every 8 (eight) hours for 9 days. What changed: when to take this   midodrine 10 MG tablet Commonly known as: PROAMATINE Take 1 tablet (10 mg total) by mouth 3 (three) times daily with  meals. What changed:  medication strength how much to take   MILK OF MAGNESIA PO Take 30 mLs by mouth as needed.   neomycin-bacitracin-polymyxin Oint Commonly known as: NEOSPORIN Apply 1 application topically 2 (two) times daily. Please apply to glans penis wound BID.   pantoprazole 40 MG tablet Commonly known as: PROTONIX Take 1 tablet (40 mg total) by mouth daily.   polyethylene glycol 17 g packet Commonly known as: MIRALAX / GLYCOLAX Take 17 g by mouth daily.   PRO-STAT PO Take 30 mLs by mouth 2 (two) times daily.   tamsulosin 0.4 MG Caps capsule Commonly known as: FLOMAX Take 1 capsule (0.4 mg total) by mouth daily after supper.   traMADol 50 MG tablet Commonly known as: Ultram Take 0.5 tablets (25 mg total) by mouth 2 (two) times daily.   vitamin C 500 MG tablet Commonly known as: ASCORBIC ACID Take 500 mg by mouth 2 (two) times daily.               Home Infusion Instuctions  (From admission, onward)           Start     Ordered   03/16/21 0000  Home infusion instructions       Question:  Instructions  Answer:  Flushing of vascular access device: 0.9% NaCl pre/post medication administration and prn patency; Heparin 100 u/ml, 81ml for implanted ports and Heparin 10u/ml, 14ml for all other central venous catheters.  03/16/21 1029              Discharge Care Instructions  (From admission, onward)           Start     Ordered   03/16/21 0000  Discharge wound care:       Comments: Every shift  -  Clean the sacrum with soap and water. Pat dry. Place 1" Iodoform gauze into the wound leaving a tail for ease of removal. Do not pack tight. Cover with 4 x 4 and secure with sacral foam dressing. Change Iodoform BID.   03/16/21 1029             Contact information for follow-up providers     Charlott Rakes, MD. Schedule an appointment as soon as possible for a visit in 1 week(s).   Specialty: Family Medicine Contact information: [place] [address] [phone]         Thayer Headings, MD. Schedule an appointment as soon as possible for a visit in 1 week(s).   Specialty: Infectious Diseases Contact information: [address] [address] [phone]              Contact information for after-discharge care     Destination     [facility] Preferred SNF .   Service: Skilled Nursing Contact information: [address] [place] [phone]                     Major procedures and Radiology Reports - PLEASE review detailed and final reports thoroughly  -        DG Chest 2 View  Addendum Date: 03/11/2021   ADDENDUM REPORT: 03/11/2021 23:21 ADDENDUM: Left PICC line is in place with the tip in the SVC. Electronically Signed   By: Rolm Baptise M.D.   On: 03/11/2021 23:21   Result Date: 03/11/2021 CLINICAL DATA:  Possible sepsis EXAM: CHEST - 2 VIEW COMPARISON:  03/11/2021 FINDINGS: Cardiomegaly. Low lung volumes with bibasilar atelectasis. No confluent opacities, effusions or edema. IMPRESSION: Low lung volumes with bibasilar atelectasis. Cardiomegaly. Electronically Signed: By: Rolm Baptise M.D. On: 03/11/2021 20:02   DG Chest Port 1 View  Result Date: 03/12/2021 CLINICAL DATA:  Shortness of breath, history of CHF EXAM: PORTABLE CHEST 1 VIEW COMPARISON:  Prior chest x-ray 03/11/2021 FINDINGS: Left upper extremity PICC. Catheter tip overlies the mid SVC. Stable cardiomegaly and mediastinal widening likely representing vascular congestion. No focal airspace consolidation, pleural effusion, pulmonary edema or pneumothorax. Mild vascular congestion is similar compared to prior. No acute osseous abnormality. IMPRESSION: Stable chest x-ray without evidence of acute cardiopulmonary process. Electronically Signed   By: Jacqulynn Cadet M.D.   On: 03/12/2021 10:18   DG Chest Port 1 View  Result Date:  03/11/2021 CLINICAL DATA:  Question sepsis.  Altered mental status. EXAM: PORTABLE CHEST 1 VIEW COMPARISON:  11/11/2020 FINDINGS: Cardiomegaly. Aortic atherosclerosis and tortuosity. Pulmonary vascularity is normal. The right lung is clear. Question mild atelectasis or infiltrate at the medial left base. Suggest lateral chest radiography when able. No effusion. IMPRESSION: Cardiomegaly and aortic atherosclerosis. Question mild volume loss or infiltrate at the medial left base. This could be better evaluated with lateral chest radiography when able. Electronically Signed   By: Nelson Chimes M.D.   On: 03/11/2021 15:10    Micro Results      Recent Results (from the past 240 hour(s))  Culture, blood (  routine x 2)     Status: None   Collection Time: 03/11/21  1:33 PM   Specimen: BLOOD RIGHT HAND  Result Value Ref Range Status   Specimen Description BLOOD RIGHT HAND  Final   Special Requests   Final    BOTTLES DRAWN AEROBIC AND ANAEROBIC Blood Culture results may not be optimal due to an inadequate volume of blood received in culture bottles   Culture   Final    NO GROWTH 5 DAYS Performed at [facility], [place] [address], [place], [place] [address]    Report Status 03/16/2021 FINAL  Final  Culture, blood (routine x 2)     Status: None   Collection Time: 03/11/21  1:38 PM   Specimen: BLOOD LEFT HAND  Result Value Ref Range Status   Specimen Description BLOOD LEFT HAND  Final   Special Requests   Final    BOTTLES DRAWN AEROBIC AND ANAEROBIC Blood Culture adequate volume   Culture   Final    NO GROWTH 5 DAYS Performed at [facility], [place] [address], [place], [place] [address]    Report Status 03/16/2021 FINAL  Final  Urine culture     Status: Abnormal   Collection Time: 03/11/21  2:28 PM   Specimen: Urine, Random  Result Value Ref Range Status   Specimen Description URINE, RANDOM  Final   Special Requests NONE  Final   Culture (A)  Final    <10,000 COLONIES/mL INSIGNIFICANT  GROWTH Performed at [facility], [place] [address], [place],  [address]    Report Status 03/12/2021 FINAL  Final  Resp Panel by RT-PCR (Flu A&B, Covid) Nasopharyngeal Swab     Status: None   Collection Time: 03/11/21  3:05 PM   Specimen: Nasopharyngeal Swab; Nasopharyngeal(NP) swabs in vial transport medium  Result Value Ref Range Status   SARS Coronavirus 2 by RT PCR NEGATIVE NEGATIVE Final    Comment: (NOTE) SARS-CoV-2 target nucleic acids are NOT DETECTED.  The SARS-CoV-2 RNA is generally detectable in upper respiratory specimens during the acute phase of infection. The lowest concentration of SARS-CoV-2 viral copies this assay can detect is 138 copies/mL. A negative result does not preclude SARS-Cov-2 infection and should not be used as the sole basis for treatment or other patient management decisions. A negative result may occur with  improper specimen collection/handling, submission of specimen other than nasopharyngeal swab, presence of viral mutation(s) within the areas targeted by this assay, and inadequate number of viral copies(<138 copies/mL). A negative result must be combined with clinical observations, patient history, and epidemiological information. The expected result is Negative.  Fact Sheet for Patients:  EntrepreneurPulse.com.au  Fact Sheet for Healthcare Providers:  IncredibleEmployment.be  This test is no t yet approved or cleared by the [place] FDA and  has been authorized for detection and/or diagnosis of SARS-CoV-2 by FDA under an Emergency Use Authorization (EUA). This EUA will remain  in effect (meaning this test can be used) for the duration of the COVID-19 declaration under Section 564(b)(1) of the Act, 21 U.S.C.section 360bbb-3(b)(1), unless the authorization is terminated  or revoked sooner.       Influenza A by PCR NEGATIVE NEGATIVE Final   Influenza B by PCR NEGATIVE NEGATIVE Final     Comment: (NOTE) The Xpert Xpress SARS-CoV-2/FLU/RSV plus assay is intended as an aid in the diagnosis of influenza from Nasopharyngeal swab specimens and should not be used as a sole basis for treatment. Nasal  washings and aspirates are unacceptable for Xpert Xpress SARS-CoV-2/FLU/RSV testing.  Fact Sheet for Patients: EntrepreneurPulse.com.au  Fact Sheet for Healthcare Providers: IncredibleEmployment.be  This test is not yet approved or cleared by the [place] FDA and has been authorized for detection and/or diagnosis of SARS-CoV-2 by FDA under an Emergency Use Authorization (EUA). This EUA will remain in effect (meaning this test can be used) for the duration of the COVID-19 declaration under Section 564(b)(1) of the Act, 21 U.S.C. section 360bbb-3(b)(1), unless the authorization is terminated or revoked.  Performed at [facility], [place] [address] [place], [place] [address]   MRSA PCR Screening     Status: None   Collection Time: 03/12/21  5:35 AM   Specimen: Nasal Mucosa; Nasopharyngeal  Result Value Ref Range Status   MRSA by PCR NEGATIVE NEGATIVE Final    Comment:        The GeneXpert MRSA Assay (FDA approved for NASAL specimens only), is one component of a comprehensive MRSA colonization surveillance program. It is not intended to diagnose MRSA infection nor to guide or monitor treatment for MRSA infections. Performed at [facility], [place] [address], [place], [place] [address]   SARS CORONAVIRUS 2 (TAT 6-24 HRS) Nasopharyngeal Nasopharyngeal Swab     Status: None   Collection Time: 03/14/21 10:41 AM   Specimen: Nasopharyngeal Swab  Result Value Ref Range Status   SARS Coronavirus 2 NEGATIVE NEGATIVE Final    Comment: (NOTE) SARS-CoV-2 target nucleic acids are NOT DETECTED.  The SARS-CoV-2 RNA is generally detectable in upper and lower respiratory specimens during the acute phase of infection.  Negative results do not preclude SARS-CoV-2 infection, do not rule out co-infections with other pathogens, and should not be used as the sole basis for treatment or other patient management decisions. Negative results must be combined with clinical observations, patient history, and epidemiological information. The expected result is Negative.  Fact Sheet for Patients: SugarRoll.be  Fact Sheet for Healthcare Providers: https://www.woods-mathews.com/  This test is not yet approved or cleared by the [place] FDA and  has been authorized for detection and/or diagnosis of SARS-CoV-2 by FDA under an Emergency Use Authorization (EUA). This EUA will remain  in effect (meaning this test can be used) for the duration of the COVID-19 declaration under Se ction 564(b)(1) of the Act, 21 U.S.C. section 360bbb-3(b)(1), unless the authorization is terminated or revoked sooner.  Performed at [facility], [place] [address], [place], [place] [address]     Today   Subjective    Jeffery Christian today has no headache,no chest abdominal pain,no new weakness tingling or numbness, feels much better     Objective   Blood pressure 106/79, pulse 90, temperature 97.8 temperature source Oral, resp. rate 20, height 6' (1.829 m), weight 87 kg, SpO2 96 %.   Intake/Output Summary (Last 24 hours) at 03/16/2021 1039 Last data filed at 03/16/2021 0608 Gross per 24 hour  Intake 480 ml  Output 1900 ml  Net -1420 ml    Exam  Awake Alert, No new F.N deficits, bilateral leg strength is 4/5 [place].AT,PERRAL Supple Neck,No JVD, No cervical lymphadenopathy appriciated.  Symmetrical Chest wall movement, Good air movement bilaterally, CTAB RRR,No Gallops,Rubs or new Murmurs, No Parasternal Heave +ve B.Sounds, Abd Soft, Non tender, No organomegaly appriciated, No rebound -guarding or rigidity. No Cyanosis, chronic trace leg edema   Data Review   CBC w Diff:  Lab  Results  Component Value Date   WBC  10.0 03/16/2021   HGB 9.9 (L) 03/16/2021   HCT 31.3 (L) 03/16/2021   PLT 273 03/16/2021   LYMPHOPCT 16 03/16/2021   MONOPCT 10 03/16/2021   EOSPCT 1 03/16/2021   BASOPCT 0 03/16/2021    CMP:  Lab Results  Component Value Date   NA 143 03/16/2021   NA 136 (A) 12/14/2020   K 3.9 03/16/2021   CL 106 03/16/2021   CO2 28 03/16/2021   BUN 22 03/16/2021   BUN 25 (A) 12/14/2020   CREATININE 1.30 (H) 03/16/2021   CREATININE 0.89 01/27/2021   GLU 158 12/14/2020   PROT 5.0 (L) 03/16/2021   PROT 6.6 06/08/2020   ALBUMIN 2.0 (L) 03/16/2021   ALBUMIN 3.9 06/08/2020   BILITOT 0.5 03/16/2021   BILITOT 0.4 06/08/2020   ALKPHOS 41 03/16/2021   AST 13 (L) 03/16/2021   ALT 9 03/16/2021  .   Total Time in preparing paper work, data evaluation and todays exam - 56 minutes  Lala Lund M.D on 03/16/2021 at 10:39 AM  [facility]

## 2021-03-16 NOTE — Progress Notes (Signed)
PT Cancellation Note  Patient Details Name: Jeffery Christian MRN: [id number] DOB: Jun 23, 1951   Cancelled Treatment:    Reason Eval/Treat Not Completed: Other (comment) per chart, patient discharging to SNF today. Holding per dept protocol.   Madelaine Etienne, DPT, PN1   Supplemental Physical Therapist [facility]    Pager [phone] Acute Rehab Office [phone]

## 2021-03-17 ENCOUNTER — Telehealth: Payer: Self-pay | Admitting: *Deleted

## 2021-03-17 ENCOUNTER — Non-Acute Institutional Stay (SKILLED_NURSING_FACILITY): Payer: Medicare Other | Admitting: Adult Health

## 2021-03-17 ENCOUNTER — Encounter: Payer: Self-pay | Admitting: Adult Health

## 2021-03-17 DIAGNOSIS — I9589 Other hypotension: Secondary | ICD-10-CM | POA: Diagnosis not present

## 2021-03-17 DIAGNOSIS — A419 Sepsis, unspecified organism: Secondary | ICD-10-CM

## 2021-03-17 DIAGNOSIS — R531 Weakness: Secondary | ICD-10-CM | POA: Diagnosis not present

## 2021-03-17 DIAGNOSIS — I4819 Other persistent atrial fibrillation: Secondary | ICD-10-CM | POA: Diagnosis not present

## 2021-03-17 DIAGNOSIS — I5032 Chronic diastolic (congestive) heart failure: Secondary | ICD-10-CM

## 2021-03-17 NOTE — Telephone Encounter (Signed)
Patient discharged to [facility] without [facility] follow up. His IV antibiotics are due to end 03/25/21. Please advise where best to schedule next week with you. Andree Coss, RN

## 2021-03-17 NOTE — Progress Notes (Signed)
Location:  [place] Room Number: 201-B Place of Service:  SNF (31) Provider:  Durenda Age, DNP, FNP-BC  Patient Care Team: Charlott Rakes, MD as PCP - General (Family Medicine)  Extended Emergency Contact Information Primary Emergency Contact: Cherylann Parr [place] Phone: [phone] Relation: Daughter Secondary Emergency Contact: SAPP,RON Home Phone: [phone] Relation: Other  Code Status:  DNR  Goals of care: Advanced Directive information Advanced Directives 03/17/2021  Does Patient Have a Medical Advance Directive? Yes  Type of Advance Directive Out of facility DNR (pink MOST or yellow form)  Does patient want to make changes to medical advance directive? No - Patient declined  Would patient like information on creating a medical advance directive? -  Pre-existing out of facility DNR order (yellow form or pink MOST form) -     Chief Complaint  Patient presents with   Hospitalization Follow-up    Follow-up from hospital stay 03/11/21-03/16/2021    HPI:  Pt is a 70 y.o. male who was re-admitted to [facility] on 03/16/21 post hospital admission 03/11/2021 to 03/16/2021 for sepsis secondary to UTI.  He has sacral osteomyelitis with sacral decubitus ulcer for which he is on 6 weeks of IV antibiotics through PICC line by IV which was a started in May.  Decubitus ulcer appears healthy and sepsis thought to be UTI related.  ID was consulted and antibiotics were ordered to be continued.  Of note, he was already on 6 weeks of Rocephin and Flagyl for his osteomyelitis via PICC line and will be continued upon discharge with stop date 03/25/2021.  He will need to follow-up with ID.  He was seen in his room today.  He was verbally responsive but not able to answer queries.   Past Medical History:  Diagnosis Date   Chronic kidney disease    Chronic systolic CHF (congestive heart failure) (HCC)    EF 35-40,  diffuse HK, mild MR, moderate LAE, mild RAE, PASP 44, L pleural eff   Dilated cardiomyopathy ([place])    likely related to tachycardia   Dysrhythmia    Persistent atrial fibrillation [facility])    Past Surgical History:  Procedure Laterality Date   CARDIOVERSION N/A 08/20/2017   Procedure: CARDIOVERSION;  Surgeon: Jerline Pain, MD;  Location: [place];  Service: Cardiovascular;  Laterality: N/A;   INCISION AND DRAINAGE ABSCESS Left 03/22/2014   Procedure: INCISION AND DRAINAGE ABSCESS LEFT BUTTOCK ABSCESS;  Surgeon: Zenovia Jarred, MD;  Location: [place];  Service: General;  Laterality: Left;    No Known Allergies  Outpatient Encounter Medications as of 03/17/2021  Medication Sig   acetaminophen (TYLENOL) 500 MG tablet Take 500 mg by mouth every 6 (six) hours as needed.   acetaminophen (TYLENOL) 650 MG suppository Place 650 mg rectally every 6 (six) hours as needed for fever.   allopurinol (ZYLOPRIM) 300 MG tablet Take 150 mg by mouth daily.   Amino Acids-Protein Hydrolys (PRO-STAT PO) Take 30 mLs by mouth 2 (two) times daily.   apixaban (ELIQUIS) 5 MG TABS tablet Take 1 tablet (5 mg total) by mouth 2 (two) times daily.   atorvastatin (LIPITOR) 20 MG tablet Take 1 tablet (20 mg total) by mouth every evening.   cefTRIAXone (ROCEPHIN) IVPB Inject 2 g into the vein daily for 10 days. Indication:  Sacral osteomyelitis First Dose: Yes Last Day of Therapy:  03/25/21 Labs - Once weekly:  CBC/D and BMP, Labs - Every other week:  ESR  and CRP Method of administration: IV Push Method of administration may be changed at the discretion of home infusion pharmacist based upon assessment of the patient and/or caregiver's ability to self-administer the medication ordered.   colchicine 0.6 MG tablet Take 0.6 mg by mouth See admin instructions. GIVE 2 CAPSULES=1.2 MG PO AT FIRST SIGN OF GOUT FLARE UP AND REPEAT 0.6 MG IN 1 HOUR IF SYMPTOMS PERSIST  TAKE 1 CAPSULE BY MOUTH 1 HOUR AFTER TAKING 2 CAPSULE IF  GOUT SYMPTOMS HAVE NOT SUBSIDED   collagenase (SANTYL) ointment Apply 1 application topically daily. Irrigate right buttock with 1/2 strength Dakins solution. Pack with santyl, Dakins moistened gauze and apply secondary dressing daily   diltiazem (CARDIZEM) 60 MG tablet Take 60 mg by mouth every 8 (eight) hours. Hold for SBP < 100 and Heart rate < 60   divalproex (DEPAKOTE) 125 MG DR tablet Take 250 mg by mouth 2 (two) times daily.   feeding supplement (ENSURE ENLIVE / ENSURE PLUS) LIQD Take 237 mLs by mouth 2 (two) times daily between meals.   ferrous sulfate 325 (65 FE) MG tablet Take 325 mg by mouth 2 (two) times daily with a meal.   Magnesium Hydroxide (MILK OF MAGNESIA PO) Take 30 mLs by mouth as needed.   midodrine (PROAMATINE) 10 MG tablet Take 1 tablet (10 mg total) by mouth 3 (three) times daily with meals.   pantoprazole (PROTONIX) 40 MG tablet Take 1 tablet (40 mg total) by mouth daily.   polyethylene glycol (MIRALAX / GLYCOLAX) 17 g packet Take 17 g by mouth daily.   tamsulosin (FLOMAX) 0.4 MG CAPS capsule Take 1 capsule (0.4 mg total) by mouth daily after supper.   traMADol (ULTRAM) 50 MG tablet Take 0.5 tablets (25 mg total) by mouth 2 (two) times daily.   vitamin C (ASCORBIC ACID) 500 MG tablet Take 500 mg by mouth 2 (two) times daily.   [DISCONTINUED] metoprolol succinate (TOPROL-XL) 25 MG 24 hr tablet Take 1 tablet (25 mg total) by mouth daily. Take with or immediately following a meal. (Patient taking differently: Take 25 mg by mouth daily. Take with or immediately following a meal. LD UNK time)   [DISCONTINUED] metroNIDAZOLE (FLAGYL) 500 MG tablet Take 1 tablet (500 mg total) by mouth every 8 (eight) hours for 9 days.   [DISCONTINUED] neomycin-bacitracin-polymyxin (NEOSPORIN) OINT Apply 1 application topically 2 (two) times daily. Please apply to glans penis wound BID.   No facility-administered encounter medications on file as of 03/17/2021.    Review of Systems   unable to  obtain due to cognitive impairment.   Immunization History  Administered Date(s) Administered   DTaP 12/14/2020   Pneumococcal Conjugate-13 10/28/2018   Pneumococcal Polysaccharide-23 09/08/2020   Pertinent  Health Maintenance Due  Topic Date Due   COLONOSCOPY (Pts 45-58yr Insurance coverage will need to be confirmed)  Never done   INFLUENZA VACCINE  05/02/2021   PNA vac Low Risk Adult  Completed   Fall Risk  01/27/2021 09/08/2020 06/08/2020 10/28/2018 07/10/2018  Falls in the past year? 1 0 0 0 No  Number falls in past yr: 0 0 - - -  Injury with Fall? 1 0 - - -  Risk for fall due to : History of fall(s);Impaired balance/gait;Impaired mobility - No Fall Risks - -  Follow up Falls evaluation completed - - - -     Vitals:   03/17/21 0917  BP: 108/68  Pulse: (!) 102  Resp: 20  Temp: (!) 97.3 F (  36.3 C)  SpO2: 95%  Weight: 191 lb 12.8 oz (87 kg)  Height: 6' (1.829 m)   Body mass index is 26.01 kg/m.  Physical Exam  GENERAL APPEARANCE: Well nourished. In no acute distress. Normal body habitus SKIN: Right buttock pressure ulcer stage IV with dressing MOUTH and THROAT: Lips are without lesions. Oral mucosa is moist and without lesions.  RESPIRATORY: Breathing is even & unlabored, BS CTAB CARDIAC: RRR, no murmur,no extra heart sounds, BLE 2+ edema GI: Abdomen soft, normal BS, no masses, no tenderness EXTREMITIES: Single-lumen PICC line on left upper arm NEUROLOGICAL: There is no tremor. Alert to self, disoriented to time and place. PSYCHIATRIC:  Affect and behavior are appropriate  Labs reviewed: Recent Labs    11/10/20 1508 11/10/20 2000 03/14/21 0405 03/15/21 0348 03/16/21 0320  NA 167*   < > 140 140 143  K 4.1   < > 3.6 3.6 3.9  CL >130*   < > 106 106 106  CO2 20*   < > _0 GLUCOSE 152*   < > 106* 105* 102*  BUN 85*   < > _1 CREATININE 2.58*   < > 1.06 1.16 1.30*  CALCIUM 8.5*   < > 8.4* 8.5* 8.7*  MG  --    < > 2.2 1.9 1.9  PHOS 3.5  --   --    --   --    < > = values in this interval not displayed.   Recent Labs    03/14/21 0405 03/15/21 0348 03/16/21 0320  AST 12* 14* 13*  ALT _2 ALKPHOS 41 42 41  BILITOT 0.6 0.5 0.5  PROT 5.1* 5.1* 5.0*  ALBUMIN 1.9* 2.0* 2.0*   Recent Labs    03/14/21 0405 03/15/21 0348 03/16/21 0320  WBC 7.9 8.6 10.0  NEUTROABS 5.5 5.9 7.2  HGB 9.6* 9.9* 9.9*  HCT 31.2* 31.7* 31.3*  MCV 85.0 84.3 84.4  PLT 239 262 273   Lab Results  Component Value Date   TSH 6.343 (H) 03/11/2021   Lab Results  Component Value Date   HGBA1C 5.3 03/11/2021   Lab Results  Component Value Date   CHOL 139 10/28/2018   HDL 39 (L) 10/28/2018   LDLCALC 65 10/28/2018   TRIG 174 (H) 10/28/2018   CHOLHDL 3.6 10/28/2018    Significant Diagnostic Results in last 30 days:  DG Chest 2 View  Addendum Date: 03/11/2021   ADDENDUM REPORT: 03/11/2021 23:21 ADDENDUM: Left PICC line is in place with the tip in the SVC. Electronically Signed   By: Rolm Baptise M.D.   On: 03/11/2021 23:21   Result Date: 03/11/2021 CLINICAL DATA:  Possible sepsis EXAM: CHEST - 2 VIEW COMPARISON:  03/11/2021 FINDINGS: Cardiomegaly. Low lung volumes with bibasilar atelectasis. No confluent opacities, effusions or edema. IMPRESSION: Low lung volumes with bibasilar atelectasis. Cardiomegaly. Electronically Signed: By: Rolm Baptise M.D. On: 03/11/2021 20:02   DG Chest Port 1 View  Result Date: 03/12/2021 CLINICAL DATA:  Shortness of breath, history of CHF EXAM: PORTABLE CHEST 1 VIEW COMPARISON:  Prior chest x-ray 03/11/2021 FINDINGS: Left upper extremity PICC. Catheter tip overlies the mid SVC. Stable cardiomegaly and mediastinal widening likely representing vascular congestion. No focal airspace consolidation, pleural effusion, pulmonary edema or pneumothorax. Mild vascular congestion is similar compared to prior. No acute osseous abnormality. IMPRESSION: Stable chest x-ray without evidence of acute cardiopulmonary process. Electronically  Signed   By: Dellis Filbert.D.  On: 03/12/2021 10:18   DG Chest Port 1 View  Result Date: 03/11/2021 CLINICAL DATA:  Question sepsis.  Altered mental status. EXAM: PORTABLE CHEST 1 VIEW COMPARISON:  11/11/2020 FINDINGS: Cardiomegaly. Aortic atherosclerosis and tortuosity. Pulmonary vascularity is normal. The right lung is clear. Question mild atelectasis or infiltrate at the medial left base. Suggest lateral chest radiography when able. No effusion. IMPRESSION: Cardiomegaly and aortic atherosclerosis. Question mild volume loss or infiltrate at the medial left base. This could be better evaluated with lateral chest radiography when able. Electronically Signed   By: Nelson Chimes M.D.   On: 03/11/2021 15:10    Assessment/Plan  1. Sepsis, due to unspecified organism, unspecified whether acute organ dysfunction present ([place]) -  ID was consulted and antibiotics were ordered to be continued.  Of note, he was already on 6 weeks of Rocephin and Flagyl for his osteomyelitis via PICC line and will be continued upon discharge with stop date 03/25/2021.  He will need to follow-up with ID in 1 week  2. Persistent atrial fibrillation (HCC) -   Continue diltiazem for rate control and Eliquis for anticoagulation  3. Chronic hypotension -   Continue midodrine 10 mg 3 times a day  4. Chronic diastolic CHF (congestive heart failure) (HCC) -   No SOB, was given IV fluid  for dehydration and Lasix 20 mg daily was held  -  will need re-evaluation of BMP in a week   5. Generalized weakness -Continue supportive care and fall precautions      Family/ staff Communication: Discussed plan of care with  charge nurse.  Labs/tests ordered: BMP in 1 week  Goals of care:   Long-term care   Durenda Age, DNP, MSN, FNP-BC [facility] [phone] (Monday-Friday 8:00 a.m. - 5:00 p.m.) [phone] (after hours)

## 2021-03-17 NOTE — Telephone Encounter (Signed)
Open up the blocked slot at 9:45 AM next Thursday, 03/24/2020.

## 2021-03-18 DIAGNOSIS — R339 Retention of urine, unspecified: Secondary | ICD-10-CM | POA: Diagnosis not present

## 2021-03-18 DIAGNOSIS — G9341 Metabolic encephalopathy: Secondary | ICD-10-CM | POA: Diagnosis not present

## 2021-03-18 DIAGNOSIS — I420 Dilated cardiomyopathy: Secondary | ICD-10-CM | POA: Diagnosis not present

## 2021-03-18 DIAGNOSIS — N179 Acute kidney failure, unspecified: Secondary | ICD-10-CM | POA: Diagnosis not present

## 2021-03-19 ENCOUNTER — Inpatient Hospital Stay (HOSPITAL_COMMUNITY)
Admission: EM | Admit: 2021-03-19 | Discharge: 2021-03-23 | DRG: 689 | Disposition: A | Discharge status: Skilled Nursing Facility | Payer: Medicare Other | Source: Skilled Nursing Facility | Attending: Internal Medicine | Admitting: Internal Medicine

## 2021-03-19 ENCOUNTER — Emergency Department (HOSPITAL_COMMUNITY): Payer: Medicare Other

## 2021-03-19 ENCOUNTER — Encounter (HOSPITAL_COMMUNITY): Payer: Self-pay

## 2021-03-19 ENCOUNTER — Other Ambulatory Visit: Payer: Self-pay

## 2021-03-19 DIAGNOSIS — G9341 Metabolic encephalopathy: Secondary | ICD-10-CM | POA: Diagnosis not present

## 2021-03-19 DIAGNOSIS — I5022 Chronic systolic (congestive) heart failure: Secondary | ICD-10-CM | POA: Diagnosis present

## 2021-03-19 DIAGNOSIS — N182 Chronic kidney disease, stage 2 (mild): Secondary | ICD-10-CM | POA: Diagnosis present

## 2021-03-19 DIAGNOSIS — N1831 Chronic kidney disease, stage 3a: Secondary | ICD-10-CM

## 2021-03-19 DIAGNOSIS — D638 Anemia in other chronic diseases classified elsewhere: Secondary | ICD-10-CM | POA: Diagnosis present

## 2021-03-19 DIAGNOSIS — L98429 Non-pressure chronic ulcer of back with unspecified severity: Secondary | ICD-10-CM | POA: Diagnosis present

## 2021-03-19 DIAGNOSIS — Z7401 Bed confinement status: Secondary | ICD-10-CM

## 2021-03-19 DIAGNOSIS — D509 Iron deficiency anemia, unspecified: Secondary | ICD-10-CM | POA: Diagnosis present

## 2021-03-19 DIAGNOSIS — Z79899 Other long term (current) drug therapy: Secondary | ICD-10-CM

## 2021-03-19 DIAGNOSIS — R000 Tachycardia, unspecified: Secondary | ICD-10-CM | POA: Diagnosis not present

## 2021-03-19 DIAGNOSIS — I959 Hypotension, unspecified: Secondary | ICD-10-CM | POA: Diagnosis not present

## 2021-03-19 DIAGNOSIS — M4628 Osteomyelitis of vertebra, sacral and sacrococcygeal region: Secondary | ICD-10-CM | POA: Diagnosis not present

## 2021-03-19 DIAGNOSIS — N179 Acute kidney failure, unspecified: Secondary | ICD-10-CM | POA: Diagnosis not present

## 2021-03-19 DIAGNOSIS — I6611 Occlusion and stenosis of right anterior cerebral artery: Secondary | ICD-10-CM | POA: Diagnosis not present

## 2021-03-19 DIAGNOSIS — Z66 Do not resuscitate: Secondary | ICD-10-CM | POA: Diagnosis present

## 2021-03-19 DIAGNOSIS — R7303 Prediabetes: Secondary | ICD-10-CM | POA: Diagnosis present

## 2021-03-19 DIAGNOSIS — N390 Urinary tract infection, site not specified: Secondary | ICD-10-CM | POA: Diagnosis not present

## 2021-03-19 DIAGNOSIS — R4781 Slurred speech: Secondary | ICD-10-CM | POA: Diagnosis not present

## 2021-03-19 DIAGNOSIS — L89154 Pressure ulcer of sacral region, stage 4: Secondary | ICD-10-CM | POA: Diagnosis present

## 2021-03-19 DIAGNOSIS — R29810 Facial weakness: Secondary | ICD-10-CM | POA: Diagnosis present

## 2021-03-19 DIAGNOSIS — Z7901 Long term (current) use of anticoagulants: Secondary | ICD-10-CM

## 2021-03-19 DIAGNOSIS — I130 Hypertensive heart and chronic kidney disease with heart failure and stage 1 through stage 4 chronic kidney disease, or unspecified chronic kidney disease: Secondary | ICD-10-CM | POA: Diagnosis present

## 2021-03-19 DIAGNOSIS — Z993 Dependence on wheelchair: Secondary | ICD-10-CM

## 2021-03-19 DIAGNOSIS — E538 Deficiency of other specified B group vitamins: Secondary | ICD-10-CM | POA: Diagnosis present

## 2021-03-19 DIAGNOSIS — R4182 Altered mental status, unspecified: Secondary | ICD-10-CM

## 2021-03-19 DIAGNOSIS — Z20822 Contact with and (suspected) exposure to covid-19: Secondary | ICD-10-CM | POA: Diagnosis present

## 2021-03-19 DIAGNOSIS — E785 Hyperlipidemia, unspecified: Secondary | ICD-10-CM | POA: Diagnosis present

## 2021-03-19 DIAGNOSIS — I4891 Unspecified atrial fibrillation: Secondary | ICD-10-CM | POA: Diagnosis present

## 2021-03-19 DIAGNOSIS — G9389 Other specified disorders of brain: Secondary | ICD-10-CM | POA: Diagnosis present

## 2021-03-19 DIAGNOSIS — R569 Unspecified convulsions: Secondary | ICD-10-CM | POA: Diagnosis present

## 2021-03-19 DIAGNOSIS — R531 Weakness: Secondary | ICD-10-CM | POA: Diagnosis not present

## 2021-03-19 DIAGNOSIS — N183 Chronic kidney disease, stage 3 unspecified: Secondary | ICD-10-CM | POA: Diagnosis present

## 2021-03-19 DIAGNOSIS — I4819 Other persistent atrial fibrillation: Secondary | ICD-10-CM | POA: Diagnosis present

## 2021-03-19 LAB — CBC
HCT: 33.8 % — ABNORMAL LOW (ref 39.0–52.0)
Hemoglobin: 10.4 g/dL — ABNORMAL LOW (ref 13.0–17.0)
MCH: 26.7 pg (ref 26.0–34.0)
MCHC: 30.8 g/dL (ref 30.0–36.0)
MCV: 86.9 fL (ref 80.0–100.0)
Platelets: 436 10*3/uL — ABNORMAL HIGH (ref 150–400)
RBC: 3.89 MIL/uL — ABNORMAL LOW (ref 4.22–5.81)
RDW: 19.5 % — ABNORMAL HIGH (ref 11.5–15.5)
WBC: 9.9 10*3/uL (ref 4.0–10.5)
nRBC: 0 % (ref 0.0–0.2)

## 2021-03-19 LAB — COMPREHENSIVE METABOLIC PANEL
ALT: 10 U/L (ref 0–44)
AST: 11 U/L — ABNORMAL LOW (ref 15–41)
Albumin: 2 g/dL — ABNORMAL LOW (ref 3.5–5.0)
Alkaline Phosphatase: 44 U/L (ref 38–126)
Anion gap: 9 (ref 5–15)
BUN: 29 mg/dL — ABNORMAL HIGH (ref 8–23)
CO2: 26 mmol/L (ref 22–32)
Calcium: 8.5 mg/dL — ABNORMAL LOW (ref 8.9–10.3)
Chloride: 104 mmol/L (ref 98–111)
Creatinine, Ser: 2.12 mg/dL — ABNORMAL HIGH (ref 0.61–1.24)
GFR, Estimated: 33 mL/min — ABNORMAL LOW (ref >60)
Glucose, Bld: 143 mg/dL — ABNORMAL HIGH (ref 70–99)
Potassium: 4.3 mmol/L (ref 3.5–5.1)
Sodium: 139 mmol/L (ref 135–145)
Total Bilirubin: 0.4 mg/dL (ref 0.3–1.2)
Total Protein: 5.4 g/dL — ABNORMAL LOW (ref 6.5–8.1)

## 2021-03-19 LAB — I-STAT CHEM 8, ED
BUN: 27 mg/dL — ABNORMAL HIGH (ref 8–23)
Calcium, Ion: 1.11 mmol/L — ABNORMAL LOW (ref 1.15–1.40)
Chloride: 105 mmol/L (ref 98–111)
Creatinine, Ser: 2.1 mg/dL — ABNORMAL HIGH (ref 0.61–1.24)
Glucose, Bld: 139 mg/dL — ABNORMAL HIGH (ref 70–99)
HCT: 30 % — ABNORMAL LOW (ref 39.0–52.0)
Hemoglobin: 10.2 g/dL — ABNORMAL LOW (ref 13.0–17.0)
Potassium: 4.3 mmol/L (ref 3.5–5.1)
Sodium: 142 mmol/L (ref 135–145)
TCO2: 25 mmol/L (ref 22–32)

## 2021-03-19 LAB — PROTIME-INR
INR: 1.8 — ABNORMAL HIGH (ref 0.8–1.2)
Prothrombin Time: 20.9 seconds — ABNORMAL HIGH (ref 11.4–15.2)

## 2021-03-19 LAB — DIFFERENTIAL
Abs Immature Granulocytes: 0.04 10*3/uL (ref 0.00–0.07)
Basophils Absolute: 0 10*3/uL (ref 0.0–0.1)
Basophils Relative: 0 %
Eosinophils Absolute: 0.2 10*3/uL (ref 0.0–0.5)
Eosinophils Relative: 2 %
Immature Granulocytes: 0 %
Lymphocytes Relative: 19 %
Lymphs Abs: 1.9 10*3/uL (ref 0.7–4.0)
Monocytes Absolute: 1 10*3/uL (ref 0.1–1.0)
Monocytes Relative: 10 %
Neutro Abs: 6.8 10*3/uL (ref 1.7–7.7)
Neutrophils Relative %: 69 %

## 2021-03-19 LAB — APTT: aPTT: 36 seconds (ref 24–36)

## 2021-03-19 LAB — CBG MONITORING, ED: Glucose-Capillary: 140 mg/dL — ABNORMAL HIGH (ref 70–99)

## 2021-03-19 LAB — ETHANOL: Alcohol, Ethyl (B): <10 mg/dL (ref <10)

## 2021-03-19 MED ORDER — TAMSULOSIN HCL 0.4 MG PO CAPS
0.4000 mg | ORAL_CAPSULE | Freq: Every day | ORAL | Status: DC
  Administered 2021-03-20 – 2021-03-22 (×3): 0.4 mg via ORAL
  Filled 2021-03-19 (×3): qty 1

## 2021-03-19 MED ORDER — IOHEXOL 350 MG/ML SOLN
50.0000 mL | Freq: Once | INTRAVENOUS | Status: AC | PRN
  Administered 2021-03-19: 50 mL via INTRAVENOUS

## 2021-03-19 MED ORDER — ATORVASTATIN CALCIUM 10 MG PO TABS
20.0000 mg | ORAL_TABLET | Freq: Every evening | ORAL | Status: DC
  Administered 2021-03-20 – 2021-03-22 (×3): 20 mg via ORAL
  Filled 2021-03-19 (×3): qty 2

## 2021-03-19 MED ORDER — VALPROATE SODIUM 100 MG/ML IV SOLN
1000.0000 mg | Freq: Once | INTRAVENOUS | Status: AC
  Administered 2021-03-20: 1000 mg via INTRAVENOUS
  Filled 2021-03-19 (×2): qty 10

## 2021-03-19 MED ORDER — LACTATED RINGERS IV SOLN: INTRAVENOUS | Status: DC

## 2021-03-19 MED ORDER — ACETAMINOPHEN 650 MG RE SUPP: 650.0000 mg | Freq: Four times a day (QID) | RECTAL | Status: DC | PRN

## 2021-03-19 MED ORDER — ONDANSETRON HCL 4 MG PO TABS: 4.0000 mg | ORAL_TABLET | Freq: Four times a day (QID) | ORAL | Status: DC | PRN

## 2021-03-19 MED ORDER — ENSURE ENLIVE PO LIQD
237.0000 mL | Freq: Two times a day (BID) | ORAL | Status: DC
  Administered 2021-03-22: 237 mL via ORAL
  Filled 2021-03-19 (×2): qty 237

## 2021-03-19 MED ORDER — ONDANSETRON HCL 4 MG/2ML IJ SOLN: 4.0000 mg | Freq: Four times a day (QID) | INTRAMUSCULAR | Status: DC | PRN

## 2021-03-19 MED ORDER — ASCORBIC ACID 500 MG PO TABS
500.0000 mg | ORAL_TABLET | Freq: Two times a day (BID) | ORAL | Status: DC
  Administered 2021-03-20 – 2021-03-23 (×7): 500 mg via ORAL
  Filled 2021-03-19 (×7): qty 1

## 2021-03-19 MED ORDER — APIXABAN 5 MG PO TABS
5.0000 mg | ORAL_TABLET | Freq: Two times a day (BID) | ORAL | Status: DC
  Administered 2021-03-20 – 2021-03-23 (×7): 5 mg via ORAL
  Filled 2021-03-19 (×7): qty 1

## 2021-03-19 MED ORDER — POLYETHYLENE GLYCOL 3350 17 G PO PACK
17.0000 g | PACK | Freq: Every day | ORAL | Status: DC
  Administered 2021-03-20: 17 g via ORAL
  Filled 2021-03-19 (×3): qty 1

## 2021-03-19 MED ORDER — SODIUM CHLORIDE 0.9 % IV SOLN
2.0000 g | Freq: Every day | INTRAVENOUS | Status: DC
  Administered 2021-03-20 – 2021-03-23 (×4): 2 g via INTRAVENOUS
  Filled 2021-03-19: qty 2
  Filled 2021-03-19 (×2): qty 20
  Filled 2021-03-19: qty 2

## 2021-03-19 MED ORDER — CEFTRIAXONE IV (FOR PTA / DISCHARGE USE ONLY): 2.0000 g | INTRAVENOUS | Status: DC

## 2021-03-19 MED ORDER — ACETAMINOPHEN 325 MG PO TABS
650.0000 mg | ORAL_TABLET | Freq: Four times a day (QID) | ORAL | Status: DC | PRN
  Administered 2021-03-21: 650 mg via ORAL
  Filled 2021-03-19: qty 2

## 2021-03-19 MED ORDER — PANTOPRAZOLE SODIUM 40 MG PO TBEC
40.0000 mg | DELAYED_RELEASE_TABLET | Freq: Every day | ORAL | Status: DC
  Administered 2021-03-20 – 2021-03-23 (×4): 40 mg via ORAL
  Filled 2021-03-19 (×4): qty 1

## 2021-03-19 MED ORDER — DILTIAZEM HCL 60 MG PO TABS
60.0000 mg | ORAL_TABLET | Freq: Three times a day (TID) | ORAL | Status: DC
  Administered 2021-03-20: 60 mg via ORAL
  Filled 2021-03-19 (×3): qty 1

## 2021-03-19 MED ORDER — METRONIDAZOLE 500 MG PO TABS
500.0000 mg | ORAL_TABLET | Freq: Three times a day (TID) | ORAL | Status: DC
  Administered 2021-03-20 (×2): 500 mg via ORAL
  Filled 2021-03-19 (×2): qty 1

## 2021-03-19 MED ORDER — LEVALBUTEROL HCL 0.63 MG/3ML IN NEBU: 0.6300 mg | INHALATION_SOLUTION | Freq: Four times a day (QID) | RESPIRATORY_TRACT | Status: DC | PRN

## 2021-03-19 MED ORDER — MIDODRINE HCL 5 MG PO TABS
10.0000 mg | ORAL_TABLET | Freq: Three times a day (TID) | ORAL | Status: DC
  Administered 2021-03-20 – 2021-03-23 (×11): 10 mg via ORAL
  Filled 2021-03-19 (×10): qty 2

## 2021-03-19 MED ORDER — ALLOPURINOL 300 MG PO TABS
150.0000 mg | ORAL_TABLET | Freq: Every day | ORAL | Status: DC
  Administered 2021-03-20 – 2021-03-23 (×4): 150 mg via ORAL
  Filled 2021-03-19: qty 1
  Filled 2021-03-19: qty 2
  Filled 2021-03-19 (×2): qty 1

## 2021-03-19 NOTE — H&P (Addendum)
History and Physical  Patient Name: Jeffery Christian     HCW:[id number]    DOB: 28-Sep-1951    DOA: 03/19/2021 PCP: Charlott Rakes, MD  Patient coming from: Nursing facility  Chief Complaint: Metabolic encephalopathy    HPI: Jeffery Christian is a 70 y.o. male, with PMH of chronic A. fib, CKD stage III, hypertension, prediabetes, gout, sacral osteomyelitis, who presented to the ER on 1/76/1607 with metabolic encephalopathy.  At the time my exam, patient is alert but confused, and cannot tell me about the events preceding his ER visit.  Thus unable to obtain complete and thorough HPI.  Apparently patient was at his nursing facility when the nurse went into his room around 1930 and noted he was encephalopathic with a possible facial droop.  Apparently he was in his normal state of health at 1730 earlier.  Again patient cannot provide me with any further details but at the time my exam no facial droop noted and just confused.  Of note, patient was recently hospitalized from June 10 to March 16, 2021 for sepsis related to UTI, sacral ulcer and sacral osteomyelitis.      ED course: -Vitals on admission: Heart rate 110, respiratory rate 27, blood pressure 87/68, afebrile, maintaining sats on room air -Labs on initial presentation: Sodium 142, potassium 4.3, chloride 105, glucose 139, BUN 27, creatinine 2.1, WBC 9.9, hemoglobin 10.4, platelets 436 -Imaging obtained on admission: CT head and CTA of head neck did not demonstrate any acute findings, did show chronic findings of ischemic disease -In the ED the patient was given Depakote.  Neurology was consulted and believes symptoms possibly related to seizure, and the hospitalist service was contacted for further evaluation and management.     ROS: A complete and thorough 12 point review of systems obtained, negative listed in HPI.     Past Medical History:  Diagnosis Date   Chronic kidney disease    Chronic systolic CHF (congestive heart failure) (HCC)     EF 35-40, diffuse HK, mild MR, moderate LAE, mild RAE, PASP 44, L pleural eff   Dilated cardiomyopathy ([place])    likely related to tachycardia   Dysrhythmia    Persistent atrial fibrillation [facility])     Past Surgical History:  Procedure Laterality Date   CARDIOVERSION N/A 08/20/2017   Procedure: CARDIOVERSION;  Surgeon: Jerline Pain, MD;  Location: [place];  Service: Cardiovascular;  Laterality: N/A;   INCISION AND DRAINAGE ABSCESS Left 03/22/2014   Procedure: INCISION AND DRAINAGE ABSCESS LEFT BUTTOCK ABSCESS;  Surgeon: Zenovia Jarred, MD;  Location: [place];  Service: General;  Laterality: Left;    Social History: Patient lives at nursing facility.  The patient primarily bedbound.  Non smoker.  No Known Allergies  Family history: family history includes Cancer in his mother; Leukemia in his father.  Prior to Admission medications   Medication Sig Start Date End Date Taking? Authorizing Provider  acetaminophen (TYLENOL) 500 MG tablet Take 500 mg by mouth every 6 (six) hours as needed for mild pain.   Yes [provider]  acetaminophen (TYLENOL) 650 MG suppository Place 650 mg rectally every 6 (six) hours as needed for fever.   Yes [provider]  allopurinol (ZYLOPRIM) 300 MG tablet Take 150 mg by mouth daily.   Yes [provider]  Amino Acids-Protein Hydrolys (PRO-STAT PO) Take 30 mLs by mouth 2 (two) times daily.   Yes [provider]  apixaban (ELIQUIS) 5 MG TABS tablet Take 1 tablet (5 mg  total) by mouth 2 (two) times daily. 03/16/21 03/16/22 Yes Thurnell Lose, MD  atorvastatin (LIPITOR) 20 MG tablet Take 1 tablet (20 mg total) by mouth every evening. 03/16/21 03/16/22 Yes Thurnell Lose, MD  bisacodyl (DULCOLAX) 10 MG suppository Place 10 mg rectally See admin instructions. If not relieved by milk of mag, give 80m bisocodyl suppository rectally for one dose in 24 hours as needed for constipation   Yes [provider]   cefTRIAXone (ROCEPHIN) IVPB Inject 2 g into the vein daily for 10 days. Indication:  Sacral osteomyelitis First Dose: Yes Last Day of Therapy:  03/25/21 Labs - Once weekly:  CBC/D and BMP, Labs - Every other week:  ESR and CRP Method of administration: IV Push Method of administration may be changed at the discretion of home infusion pharmacist based upon assessment of the patient and/or caregiver's ability to self-administer the medication ordered. 03/16/21 03/26/21 Yes SThurnell Lose MD  colchicine 0.6 MG tablet Take 0.6 mg by mouth See admin instructions. 1.2 MG orally AT FIRST SIGN OF GOUT FLARE UP AND REPEAT 0.6 MG IN 1 HOUR IF SYMPTOMS PERSIST   Yes [provider]  diltiazem (CARDIZEM) 60 MG tablet Take 60 mg by mouth every 8 (eight) hours. Hold for SBP < 100 and Heart rate < 60   Yes [provider]  divalproex (DEPAKOTE SPRINKLE) 125 MG capsule Take 250 mg by mouth 2 (two) times daily.   Yes [provider]  feeding supplement (ENSURE ENLIVE / ENSURE PLUS) LIQD Take 237 mLs by mouth 2 (two) times daily between meals. 11/16/20  Yes Elgergawy, DSilver Huguenin MD  ferrous sulfate 325 (65 FE) MG tablet Take 325 mg by mouth 2 (two) times daily with a meal.   Yes [provider]  Magnesium Hydroxide (MILK OF MAGNESIA PO) Take 30 mLs by mouth daily as needed (if no bowel movement in 3 days).   Yes [provider]  midodrine (PROAMATINE) 10 MG tablet Take 1 tablet (10 mg total) by mouth 3 (three) times daily with meals. 03/16/21  Yes SThurnell Lose MD  pantoprazole (PROTONIX) 40 MG tablet Take 1 tablet (40 mg total) by mouth daily. 11/16/20  Yes Elgergawy, DSilver Huguenin MD  polyethylene glycol (MIRALAX / GLYCOLAX) 17 g packet Take 17 g by mouth daily.   Yes [provider]  Sodium Phosphates (RA SALINE ENEMA) 19-7 GM/118ML ENEM Place 118 mLs rectally See admin instructions. If not relieved by biscodyl suppository, give disposable saline enemal rectally  for one dose in 24 hours as needed for constipation   Yes [provider]  tamsulosin (FLOMAX) 0.4 MG CAPS capsule Take 1 capsule (0.4 mg total) by mouth daily after supper. 11/16/20  Yes Elgergawy, DSilver Huguenin MD  traMADol (ULTRAM) 50 MG tablet Take 0.5 tablets (25 mg total) by mouth 2 (two) times daily. 03/16/21  Yes SThurnell Lose MD  vitamin C (ASCORBIC ACID) 500 MG tablet Take 500 mg by mouth 2 (two) times daily.   Yes [provider]       Physical Exam: BP (!) 116/97   Pulse (!) 124   Temp 97.8 F (36.6 C) (Oral)   Resp 19   Wt 85.5 kg   SpO2 100%   BMI 25.57 kg/m   General appearance: well, adult male, alert but confused and in no acute distress .   Eyes: Anicteric, conjunctiva pink, lids and lashes normal. PERRL.    ENT: No nasal deformity, discharge, epistaxis.  Hearing intact. OP moist without lesions.   Neck: No neck masses.  Trachea midline.  No thyromegaly/tenderness. Lymph: No cervical or supraclavicular lymphadenopathy. Skin: Warm and dry.  No jaundice.  No suspicious rashes or lesions. Cardiac: Irregularly irregular, tachycardic, nl S1-S2, no murmurs appreciated.  No LE edema.  Radial and pedal pulses 2+ and symmetric. Respiratory: Normal respiratory rate and rhythm.  CTAB without rales or wheezes. Abdomen: Abdomen soft.  No tenderness with palpation. No ascites, distension, hepatosplenomegaly.   MSK: No deformities or effusions of the large joints of the upper or lower extremities bilaterally.  No cyanosis or clubbing. Neuro: Cranial nerves 2 through 12 grossly intact.  Sensation intact to light touch. Speech is fluent.  Marland Kitchen    Psych: Alert butconfused    Labs on Admission:  I have personally reviewed following labs and imaging studies: CBC: Recent Labs  Lab 03/13/21 0435 03/14/21 0405 03/15/21 0348 03/16/21 0320 03/19/21 2115 03/19/21 2120  WBC 8.2 7.9 8.6 10.0 9.9  --   NEUTROABS 5.6 5.5 5.9 7.2 6.8  --   HGB 9.9* 9.6* 9.9* 9.9* 10.4*  10.2*  HCT 31.4* 31.2* 31.7* 31.3* 33.8* 30.0*  MCV 84.6 85.0 84.3 84.4 86.9  --   PLT 210 239 262 273 436*  --    Basic Metabolic Panel: Recent Labs  Lab 03/13/21 0435 03/14/21 0405 03/15/21 0348 03/16/21 0320 03/19/21 2115 03/19/21 2120  NA 141 140 140 143 139 142  K 3.6 3.6 3.6 3.9 4.3 4.3  CL 105 106 106 106 104 105  CO2 _0 --   GLUCOSE 102* 106* 105* 102* 143* 139*  BUN _1 29* 27*  CREATININE 1.00 1.06 1.16 1.30* 2.12* 2.10*  CALCIUM 8.5* 8.4* 8.5* 8.7* 8.5*  --   MG 1.6* 2.2 1.9 1.9  --   --    GFR: Estimated Creatinine Clearance: 37 mL/min (A) (by C-G formula based on SCr of 2.1 mg/dL (H)).  Liver Function Tests: Recent Labs  Lab 03/13/21 0435 03/14/21 0405 03/15/21 0348 03/16/21 0320 03/19/21 2115  AST 12* 12* 14* 13* 11*  ALT _2 ALKPHOS 40 41 42 41 44  BILITOT 0.6 0.6 0.5 0.5 0.4  PROT 4.9* 5.1* 5.1* 5.0* 5.4*  ALBUMIN 1.9* 1.9* 2.0* 2.0* 2.0*   No results for input(s): LIPASE, AMYLASE in the last 168 hours. No results for input(s): AMMONIA in the last 168 hours. Coagulation Profile: Recent Labs  Lab 03/19/21 2115  INR 1.8*   Cardiac Enzymes: No results for input(s): CKTOTAL, CKMB, CKMBINDEX, TROPONINI in the last 168 hours. BNP (last 3 results) No results for input(s): PROBNP in the last 8760 hours. HbA1C: No results for input(s): HGBA1C in the last 72 hours. CBG: Recent Labs  Lab 03/19/21 2112  GLUCAP 140*   Lipid Profile: No results for input(s): CHOL, HDL, LDLCALC, TRIG, CHOLHDL, LDLDIRECT in the last 72 hours. Thyroid Function Tests: No results for input(s): TSH, T4TOTAL, FREET4, T3FREE, THYROIDAB in the last 72 hours. Anemia Panel: No results for input(s): VITAMINB12, FOLATE, FERRITIN, TIBC, IRON, RETICCTPCT in the last 72 hours.   Recent Results (from the past 240 hour(s))  Culture, blood (routine x 2)     Status: None   Collection Time: 03/11/21  1:33 PM   Specimen: BLOOD RIGHT HAND  Result Value  Ref Range Status   Specimen Description BLOOD RIGHT HAND  Final   Special Requests   Final  BOTTLES DRAWN AEROBIC AND ANAEROBIC Blood Culture results may not be optimal due to an inadequate volume of blood received in culture bottles   Culture   Final    NO GROWTH 5 DAYS Performed at [facility], [place] [address] [place], [place] [address]    Report Status 03/16/2021 FINAL  Final  Culture, blood (routine x 2)     Status: None   Collection Time: 03/11/21  1:38 PM   Specimen: BLOOD LEFT HAND  Result Value Ref Range Status   Specimen Description BLOOD LEFT HAND  Final   Special Requests   Final    BOTTLES DRAWN AEROBIC AND ANAEROBIC Blood Culture adequate volume   Culture   Final    NO GROWTH 5 DAYS Performed at [facility], [place] [address], [place], [place] [address]    Report Status 03/16/2021 FINAL  Final  Urine culture     Status: Abnormal   Collection Time: 03/11/21  2:28 PM   Specimen: Urine, Random  Result Value Ref Range Status   Specimen Description URINE, RANDOM  Final   Special Requests NONE  Final   Culture (A)  Final    <10,000 COLONIES/mL INSIGNIFICANT GROWTH Performed at [facility], [place] [address], [place], [place] [address]    Report Status 03/12/2021 FINAL  Final  Resp Panel by RT-PCR (Flu A&B, Covid) Nasopharyngeal Swab     Status: None   Collection Time: 03/11/21  3:05 PM   Specimen: Nasopharyngeal Swab; Nasopharyngeal(NP) swabs in vial transport medium  Result Value Ref Range Status   SARS Coronavirus 2 by RT PCR NEGATIVE NEGATIVE Final    Comment: (NOTE) SARS-CoV-2 target nucleic acids are NOT DETECTED.  The SARS-CoV-2 RNA is generally detectable in upper respiratory specimens during the acute phase of infection. The lowest concentration of SARS-CoV-2 viral copies this assay can detect is 138 copies/mL. A negative result does not preclude SARS-Cov-2 infection and should not be used as the sole basis for treatment or other patient  management decisions. A negative result may occur with  improper specimen collection/handling, submission of specimen other than nasopharyngeal swab, presence of viral mutation(s) within the areas targeted by this assay, and inadequate number of viral copies(<138 copies/mL). A negative result must be combined with clinical observations, patient history, and epidemiological information. The expected result is Negative.  Fact Sheet for Patients:  EntrepreneurPulse.com.au  Fact Sheet for Healthcare Providers:  IncredibleEmployment.be  This test is no t yet approved or cleared by the [place] FDA and  has been authorized for detection and/or diagnosis of SARS-CoV-2 by FDA under an Emergency Use Authorization (EUA). This EUA will remain  in effect (meaning this test can be used) for the duration of the COVID-19 declaration under Section 564(b)(1) of the Act, 21 U.S.C.section 360bbb-3(b)(1), unless the authorization is terminated  or revoked sooner.       Influenza A by PCR NEGATIVE NEGATIVE Final   Influenza B by PCR NEGATIVE NEGATIVE Final    Comment: (NOTE) The Xpert Xpress SARS-CoV-2/FLU/RSV plus assay is intended as an aid in the diagnosis of influenza from Nasopharyngeal swab specimens and should not be used as a sole basis for treatment. Nasal washings and aspirates are unacceptable for Xpert Xpress SARS-CoV-2/FLU/RSV testing.  Fact Sheet for Patients: EntrepreneurPulse.com.au  Fact Sheet for Healthcare Providers: IncredibleEmployment.be  This test is not yet approved or cleared by the [place] FDA and has been authorized for detection and/or diagnosis of SARS-CoV-2 by FDA  under an Emergency Use Authorization (EUA). This EUA will remain in effect (meaning this test can be used) for the duration of the COVID-19 declaration under Section 564(b)(1) of the Act, 21 U.S.C. section 360bbb-3(b)(1),  unless the authorization is terminated or revoked.  Performed at [facility], [place] [address] [place], [place] [address]   MRSA PCR Screening     Status: None   Collection Time: 03/12/21  5:35 AM   Specimen: Nasal Mucosa; Nasopharyngeal  Result Value Ref Range Status   MRSA by PCR NEGATIVE NEGATIVE Final    Comment:        The GeneXpert MRSA Assay (FDA approved for NASAL specimens only), is one component of a comprehensive MRSA colonization surveillance program. It is not intended to diagnose MRSA infection nor to guide or monitor treatment for MRSA infections. Performed at [facility], [place] [address] [place], [place] [address]   SARS CORONAVIRUS 2 (TAT 6-24 HRS) Nasopharyngeal Nasopharyngeal Swab     Status: None   Collection Time: 03/14/21 10:41 AM   Specimen: Nasopharyngeal Swab  Result Value Ref Range Status   SARS Coronavirus 2 NEGATIVE NEGATIVE Final    Comment: (NOTE) SARS-CoV-2 target nucleic acids are NOT DETECTED.  The SARS-CoV-2 RNA is generally detectable in upper and lower respiratory specimens during the acute phase of infection. Negative results do not preclude SARS-CoV-2 infection, do not rule out co-infections with other pathogens, and should not be used as the sole basis for treatment or other patient management decisions. Negative results must be combined with clinical observations, patient history, and epidemiological information. The expected result is Negative.  Fact Sheet for Patients: SugarRoll.be  Fact Sheet for Healthcare Providers: https://www.woods-mathews.com/  This test is not yet approved or cleared by the [place] FDA and  has been authorized for detection and/or diagnosis of SARS-CoV-2 by FDA under an Emergency Use Authorization (EUA). This EUA will remain  in effect (meaning this test can be used) for the duration of the COVID-19 declaration under Se ction 564(b)(1) of the  Act, 21 U.S.C. section 360bbb-3(b)(1), unless the authorization is terminated or revoked sooner.  Performed at [facility], [place] [address] [place], [place] [address]            Radiological Exams on Admission: Personally reviewed imaging which shows: CT head and CTA of head neck did not demonstrate any acute findings, did show chronic findings of ischemic disease CT HEAD CODE STROKE WO CONTRAST  Result Date: 03/19/2021 CLINICAL DATA:  Right facial droop and aphasia EXAM: CT HEAD WITHOUT CONTRAST CT ANGIOGRAPHY OF THE HEAD AND NECK TECHNIQUE: Contiguous axial images were obtained from the base of the skull through the vertex without intravenous contrast. Multidetector CT imaging of the head and neck was performed using the standard protocol during bolus administration of intravenous contrast. Multiplanar CT image reconstructions and MIPs were obtained to evaluate the vascular anatomy. Carotid stenosis measurements (when applicable) are obtained utilizing NASCET criteria, using the distal internal carotid diameter as the denominator. CONTRAST:  22m OMNIPAQUE IOHEXOL 350 MG/ML SOLN COMPARISON:  None. FINDINGS: CT HEAD FINDINGS Brain: There is no mass, hemorrhage or extra-axial collection. There is generalized atrophy without lobar predilection. Bifrontal encephalomalacia. There is hypoattenuation of the periventricular white matter, most commonly indicating chronic ischemic microangiopathy. Old left basal ganglia small vessel infarct. Skull: The visualized skull base, calvarium and extracranial soft tissues are normal. Sinuses/Orbits: No fluid levels or advanced mucosal thickening of the visualized paranasal sinuses. No  mastoid or middle ear effusion. The orbits are normal. ASPECTS ([place] Stroke Program Early CT Score) - Ganglionic level infarction (caudate, lentiform nuclei, internal capsule, insula, M1-M3 cortex): 7 - Supraganglionic infarction (M4-M6 cortex): 3 Total score (0-10 with 10  being normal): 10 Review of the MIP images confirms the above findings CTA NECK FINDINGS SKELETON: There is no bony spinal canal stenosis. No lytic or blastic lesion. OTHER NECK: Normal pharynx, larynx and major salivary glands. No cervical lymphadenopathy. Unremarkable thyroid gland. UPPER CHEST: Bibasilar atelectasis. AORTIC ARCH: There is no calcific atherosclerosis of the aortic arch. There is no aneurysm, dissection or hemodynamically significant stenosis of the visualized portion of the aorta. Conventional 3 vessel aortic branching pattern. The visualized proximal subclavian arteries are widely patent. RIGHT CAROTID SYSTEM: Normal without aneurysm, dissection or stenosis. LEFT CAROTID SYSTEM: Normal without aneurysm, dissection or stenosis. VERTEBRAL ARTERIES: Left dominant configuration. Both origins are clearly patent. There is no dissection, occlusion or flow-limiting stenosis to the skull base (V1-V3 segments). CTA HEAD FINDINGS POSTERIOR CIRCULATION: --Vertebral arteries: Normal V4 segments. --Inferior cerebellar arteries: Normal. --Basilar artery: Normal. --Superior cerebellar arteries: Normal. --Posterior cerebral arteries (PCA): Normal. ANTERIOR CIRCULATION: --Intracranial internal carotid arteries: Normal. 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 Left remains patent. --Middle cerebral arteries (MCA): Normal. VENOUS SINUSES: As permitted by contrast timing, patent. ANATOMIC VARIANTS: None Review of the MIP images confirms the above findings. IMPRESSION: 1. No acute intracranial abnormality.  ASPECTS is 10. 2. No emergent large vessel occlusion or high-grade stenosis. 3. 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 4. 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 These results were communicated to Lynnae Sandhoff at 9:50 pm on 03/19/2021 by text page via the [facility] messaging system. Electronically Signed    By: Ulyses Jarred M.D.   On: 03/19/2021 21:50   CT ANGIO HEAD CODE STROKE  Result Date: 03/19/2021 CLINICAL DATA:  Right facial droop and aphasia EXAM: CT HEAD WITHOUT CONTRAST CT ANGIOGRAPHY OF THE HEAD AND NECK TECHNIQUE: Contiguous axial images were obtained from the base of the skull through the vertex without intravenous contrast. Multidetector CT imaging of the head and neck was performed using the standard protocol during bolus administration of intravenous contrast. Multiplanar CT image reconstructions and MIPs were obtained to evaluate the vascular anatomy. Carotid stenosis measurements (when applicable) are obtained utilizing NASCET criteria, using the distal internal carotid diameter as the denominator. CONTRAST:  11m OMNIPAQUE IOHEXOL 350 MG/ML SOLN COMPARISON:  None. FINDINGS: CT HEAD FINDINGS Brain: There is no mass, hemorrhage or extra-axial collection. There is generalized atrophy without lobar predilection. Bifrontal encephalomalacia. There is hypoattenuation of the periventricular white matter, most commonly indicating chronic ischemic microangiopathy. Old left basal ganglia small vessel infarct. Skull: The visualized skull base, calvarium and extracranial soft tissues are normal. Sinuses/Orbits: No fluid levels or advanced mucosal thickening of the visualized paranasal sinuses. No mastoid or middle ear effusion. The orbits are normal. ASPECTS ([facility] Score) - Ganglionic level infarction (caudate, lentiform nuclei, internal capsule, insula, M1-M3 cortex): 7 - Supraganglionic infarction (M4-M6 cortex): 3 Total score (0-10 with 10 being normal): 10 Review of the MIP images confirms the above findings CTA NECK FINDINGS SKELETON: There is no bony spinal canal stenosis. No lytic or blastic lesion. OTHER NECK: Normal pharynx, larynx and major salivary glands. No cervical lymphadenopathy. Unremarkable thyroid gland. UPPER CHEST: Bibasilar atelectasis. AORTIC ARCH: There is no  calcific atherosclerosis of the aortic arch. There is no aneurysm,  dissection or hemodynamically significant stenosis of the visualized portion of the aorta. Conventional 3 vessel aortic branching pattern. The visualized proximal subclavian arteries are widely patent. RIGHT CAROTID SYSTEM: Normal without aneurysm, dissection or stenosis. LEFT CAROTID SYSTEM: Normal without aneurysm, dissection or stenosis. VERTEBRAL ARTERIES: Left dominant configuration. Both origins are clearly patent. There is no dissection, occlusion or flow-limiting stenosis to the skull base (V1-V3 segments). CTA HEAD FINDINGS POSTERIOR CIRCULATION: --Vertebral arteries: Normal V4 segments. --Inferior cerebellar arteries: Normal. --Basilar artery: Normal. --Superior cerebellar arteries: Normal. --Posterior cerebral arteries (PCA): Normal. ANTERIOR CIRCULATION: --Intracranial internal carotid arteries: Normal. 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 Left remains patent. --Middle cerebral arteries (MCA): Normal. VENOUS SINUSES: As permitted by contrast timing, patent. ANATOMIC VARIANTS: None Review of the MIP images confirms the above findings. IMPRESSION: 1. No acute intracranial abnormality.  ASPECTS is 10. 2. No emergent large vessel occlusion or high-grade stenosis. 3. 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 4. 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 These results were communicated to Lynnae Sandhoff at 9:50 pm on 03/19/2021 by text page via the [facility] messaging system. Electronically Signed   By: Ulyses Jarred M.D.   On: 03/19/2021 21:50   CT ANGIO NECK CODE STROKE  Result Date: 03/19/2021 CLINICAL DATA:  Right facial droop and aphasia EXAM: CT HEAD WITHOUT CONTRAST CT ANGIOGRAPHY OF THE HEAD AND NECK TECHNIQUE: Contiguous axial images were obtained from the base of the skull through the vertex without intravenous contrast.  Multidetector CT imaging of the head and neck was performed using the standard protocol during bolus administration of intravenous contrast. Multiplanar CT image reconstructions and MIPs were obtained to evaluate the vascular anatomy. Carotid stenosis measurements (when applicable) are obtained utilizing NASCET criteria, using the distal internal carotid diameter as the denominator. CONTRAST:  58m OMNIPAQUE IOHEXOL 350 MG/ML SOLN COMPARISON:  None. FINDINGS: CT HEAD FINDINGS Brain: There is no mass, hemorrhage or extra-axial collection. There is generalized atrophy without lobar predilection. Bifrontal encephalomalacia. There is hypoattenuation of the periventricular white matter, most commonly indicating chronic ischemic microangiopathy. Old left basal ganglia small vessel infarct. Skull: The visualized skull base, calvarium and extracranial soft tissues are normal. Sinuses/Orbits: No fluid levels or advanced mucosal thickening of the visualized paranasal sinuses. No mastoid or middle ear effusion. The orbits are normal. ASPECTS ([facility]roke Program Early CT Score) - Ganglionic level infarction (caudate, lentiform nuclei, internal capsule, insula, M1-M3 cortex): 7 - Supraganglionic infarction (M4-M6 cortex): 3 Total score (0-10 with 10 being normal): 10 Review of the MIP images confirms the above findings CTA NECK FINDINGS SKELETON: There is no bony spinal canal stenosis. No lytic or blastic lesion. OTHER NECK: Normal pharynx, larynx and major salivary glands. No cervical lymphadenopathy. Unremarkable thyroid gland. UPPER CHEST: Bibasilar atelectasis. AORTIC ARCH: There is no calcific atherosclerosis of the aortic arch. There is no aneurysm, dissection or hemodynamically significant stenosis of the visualized portion of the aorta. Conventional 3 vessel aortic branching pattern. The visualized proximal subclavian arteries are widely patent. RIGHT CAROTID SYSTEM: Normal without aneurysm, dissection or stenosis.  LEFT CAROTID SYSTEM: Normal without aneurysm, dissection or stenosis. VERTEBRAL ARTERIES: Left dominant configuration. Both origins are clearly patent. There is no dissection, occlusion or flow-limiting stenosis to the skull base (V1-V3 segments). CTA HEAD FINDINGS POSTERIOR CIRCULATION: --Vertebral arteries: Normal V4 segments. --Inferior cerebellar arteries: Normal. --Basilar artery: Normal. --Superior cerebellar arteries: Normal. --Posterior cerebral arteries (PCA): Normal. ANTERIOR CIRCULATION: --Intracranial internal carotid arteries: Normal. 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 Left remains patent. --Middle cerebral arteries (MCA): Normal. VENOUS SINUSES: As permitted by contrast timing, patent. ANATOMIC VARIANTS: None Review of the MIP images confirms the above findings. IMPRESSION: 1. No acute intracranial abnormality.  ASPECTS is 10. 2. No emergent large vessel occlusion or high-grade stenosis. 3. 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 4. 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 These results were communicated to Lynnae Sandhoff at 9:50 pm on 03/19/2021 by text page via the [facility] messaging system. Electronically Signed   By: Ulyses Jarred M.D.   On: 03/19/2021 21:50          Assessment/Plan   1.  Metabolic encephalopathy -Prior to admission patient was found to be encephalopathic at his nursing facility -CT head and CTA of head neck did not demonstrate any acute findings, did show chronic findings of ischemic disease -Neurology consulted in the ED, felt to be secondary to seizure - Depakote loaded in the ED.  We will continue to follow neurology recommendations.  Apparently might recommend EEG. - Fall and seizure precautions  2.  Concern for seizure -See plans above  3.  AKI -On admission creatinine 2.1 - Baseline creatinine appears around 1.2.  Additionally creatinine was  1.3 on 03/16/2021 - Urinalysis pending - Strict I's and O's -We will start on gentle IV hydration - Follow-up labs ordered  4.  Atrial fibrillation with RVR -History of chronic A. fib - On admission heart rate in the 110s -Start back on home Cardizem and Eliquis - Telemetry  5.  Chronic sacral ulcer and osteomyelitis -Present on admission -Continue home Rocephin and flagyl -Wound care consulted  6.  Chronic hypertension -Blood pressure currently acceptable - Continue home midodrine      DVT prophylaxis: Continue home Eliquis Code Status: DNR Family Communication: None Disposition Plan: Anticipate discharge to nursing facility when medically optimized Consults called: Neuro contacted by ER Admission status: Observation with telemetry   At the point of initial evaluation, it is my clinical opinion that admission for OBSERVATION is reasonable and necessary because the patient's presenting complaints in the context of their chronic conditions represent sufficient risk of deterioration or significant morbidity to constitute reasonable grounds for close observation in the hospital setting, but that the patient may be medically stable for discharge from the hospital within 24 to 48 hours.    Medical decision making: Patient seen at 10:55 PM on 03/19/2021.  The patient was discussed with ER provider.  What exists of the patient's chart was reviewed in depth and summarized above.  Clinical condition: Fair.        Doran Heater [facility] Please page though Nora or Epic secure chat:  For password, contact charge nurse

## 2021-03-19 NOTE — ED Triage Notes (Signed)
Patient arrives from [place] with [facility], nurse reports she checked on him at 1730 and he was his usual self, (baseline conversant but confused), at 1930, he was non-verbal, PICC to L arm, currently being treated for UTI and sacral wound.

## 2021-03-19 NOTE — ED Provider Notes (Signed)
[facility] EMERGENCY DEPARTMENT Provider Note   CSN: [id number] Arrival date & time: 03/19/21  2110  An emergency department physician performed an initial assessment on this suspected stroke patient at 2118.  History Chief Complaint  Patient presents with   Code Stroke    Jeffery Christian is a 70 y.o. male.  70 year old male with prior medical history as detailed below presents for evaluation.  Patient with code stroke activation in the field.  Patient was last known normal around 530 this evening.  Patient staff then noticed that he was altered from his baseline.  Patient was nonverbal.  Patient may have had facial droop.  Patient seen by neuro team on evaluation.  Patient is not a candidate for tPA per neurology.  They feel that his symptoms may be related to possible seizure activity.  Depakote load ordered by neurology.  Level 5 caveat.  Patient is not able to provide significant history.  The history is provided by the patient and medical records.  Illness Location:  AMS, possible seizure versus CVA. Severity:  Moderate Onset quality: LKW 1730. 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   Normochromic normocytic anemia 12/14/2020   Urinary retention 11/18/2020   Neurocognitive deficits 11/18/2020   Acute renal failure superimposed on stage 3 chronic kidney disease ([place]) 89/16/9450   Acute  metabolic encephalopathy 38/88/2800   Hypernatremia 11/09/2020   Elevated troponin 11/09/2020   COVID-19 virus infection 11/09/2020   Hypertension 01/07/2018   Atrial fibrillation with rapid ventricular response ([place]) 08/05/2017   CKD (chronic kidney disease), stage III ([place]) 08/05/2017   Persistent atrial fibrillation (HCC)    Chronic systolic CHF (congestive heart failure) ([place])    Dilated cardiomyopathy ([place])    Shortness of breath    Gout 07/18/2017   Left buttock abscess 03/22/2014    Past Surgical History:  Procedure Laterality Date   CARDIOVERSION N/A 08/20/2017   Procedure: CARDIOVERSION;  Surgeon: Jerline Pain, MD;  Location: [facility];  Service: Cardiovascular;  Laterality: N/A;   INCISION AND DRAINAGE ABSCESS Left 03/22/2014   Procedure: INCISION AND DRAINAGE ABSCESS LEFT BUTTOCK ABSCESS;  Surgeon: Zenovia Jarred, MD;  Location: [facility] OR;  Service: General;  Laterality: Left;       Family History  Problem Relation Age of Onset   Leukemia Father        27   Cancer Mother     Social History   Tobacco Use   Smoking status: Never   Smokeless tobacco: Never  Vaping Use   Vaping Use: Never used  Substance Use Topics   Alcohol use: Not Currently    Comment: pt states he "does not drink a lot anymore"   Drug use: No    Home Medications Prior to Admission medications   Medication Sig Start Date End Date Taking? Authorizing Provider  acetaminophen (TYLENOL) 500 MG  tablet Take 500 mg by mouth every 6 (six) hours as needed for mild pain.   Yes [provider]  acetaminophen (TYLENOL) 650 MG suppository Place 650 mg rectally every 6 (six) hours as needed for fever.   Yes [provider]  allopurinol (ZYLOPRIM) 300 MG tablet Take 150 mg by mouth daily.   Yes [provider]  Amino Acids-Protein Hydrolys (PRO-STAT PO) Take 30 mLs by mouth 2 (two) times daily.   Yes [provider]  apixaban (ELIQUIS) 5 MG TABS tablet Take 1  tablet (5 mg total) by mouth 2 (two) times daily. 03/16/21 03/16/22 Yes Thurnell Lose, MD  atorvastatin (LIPITOR) 20 MG tablet Take 1 tablet (20 mg total) by mouth every evening. 03/16/21 03/16/22 Yes Thurnell Lose, MD  bisacodyl (DULCOLAX) 10 MG suppository Place 10 mg rectally See admin instructions. If not relieved by milk of mag, give $RemoveB'10mg'AzIDAddV$  bisocodyl suppository rectally for one dose in 24 hours as needed for constipation   Yes [provider]  cefTRIAXone (ROCEPHIN) IVPB Inject 2 g into the vein daily for 10 days. Indication:  Sacral osteomyelitis First Dose: Yes Last Day of Therapy:  03/25/21 Labs - Once weekly:  CBC/D and BMP, Labs - Every other week:  ESR and CRP Method of administration: IV Push Method of administration may be changed at the discretion of home infusion pharmacist based upon assessment of the patient and/or caregiver's ability to self-administer the medication ordered. 03/16/21 03/26/21 Yes Thurnell Lose, MD  colchicine 0.6 MG tablet Take 0.6 mg by mouth See admin instructions. 1.2 MG orally AT FIRST SIGN OF GOUT FLARE UP AND REPEAT 0.6 MG IN 1 HOUR IF SYMPTOMS PERSIST   Yes [provider]  diltiazem (CARDIZEM) 60 MG tablet Take 60 mg by mouth every 8 (eight) hours. Hold for SBP < 100 and Heart rate < 60   Yes [provider]  divalproex (DEPAKOTE SPRINKLE) 125 MG capsule Take 250 mg by mouth 2 (two) times daily.   Yes [provider]  feeding supplement (ENSURE ENLIVE / ENSURE PLUS) LIQD Take 237 mLs by mouth 2 (two) times daily between meals. 11/16/20  Yes Elgergawy, Silver Huguenin, MD  ferrous sulfate 325 (65 FE) MG tablet Take 325 mg by mouth 2 (two) times daily with a meal.   Yes [provider]  Magnesium Hydroxide (MILK OF MAGNESIA PO) Take 30 mLs by mouth daily as needed (if no bowel movement in 3 days).   Yes [provider]  midodrine (PROAMATINE) 10 MG tablet Take 1 tablet (10 mg total) by mouth 3 (three) times  daily with meals. 03/16/21  Yes Thurnell Lose, MD  pantoprazole (PROTONIX) 40 MG tablet Take 1 tablet (40 mg total) by mouth daily. 11/16/20  Yes Elgergawy, Silver Huguenin, MD  polyethylene glycol (MIRALAX / GLYCOLAX) 17 g packet Take 17 g by mouth daily.   Yes [provider]  Sodium Phosphates (RA SALINE ENEMA) 19-7 GM/118ML ENEM Place 118 mLs rectally See admin instructions. If not relieved by biscodyl suppository, give disposable saline enemal rectally for one dose in 24 hours as needed for constipation   Yes [provider]  tamsulosin (FLOMAX) 0.4 MG CAPS capsule Take 1 capsule (0.4 mg total) by mouth daily after supper. 11/16/20  Yes Elgergawy, Silver Huguenin, MD  traMADol (ULTRAM) 50 MG tablet Take 0.5 tablets (25 mg total) by mouth 2 (two) times daily. 03/16/21  Yes Thurnell Lose, MD  vitamin C (ASCORBIC ACID)  500 MG tablet Take 500 mg by mouth 2 (two) times daily.   Yes [provider]    Allergies    Patient has no known allergies.  Review of Systems   Review of Systems  All other systems reviewed and are negative.  Physical Exam Updated Vital Signs BP (!) 116/97   Pulse (!) 124   Temp 97.8 F (36.6 C) (Oral)   Resp 19   Wt 85.5 kg   SpO2 100%   BMI 25.57 kg/m   Physical Exam Vitals and nursing note reviewed.  Constitutional:      General: He is not in acute distress.    Appearance: Normal appearance. He is well-developed.  HENT:     Head: Normocephalic and atraumatic.  Eyes:     Conjunctiva/sclera: Conjunctivae normal.     Pupils: Pupils are equal, round, and reactive to light.  Cardiovascular:     Rate and Rhythm: Normal rate and regular rhythm.     Heart sounds: Normal heart sounds.  Pulmonary:     Effort: Pulmonary effort is normal. No respiratory distress.     Breath sounds: Normal breath sounds.  Abdominal:     General: There is no distension.     Palpations: Abdomen is soft.     Tenderness: There is no abdominal tenderness.   Musculoskeletal:        General: No deformity. Normal range of motion.     Cervical back: Normal range of motion and neck supple.  Skin:    General: Skin is warm and dry.  Neurological:     General: No focal deficit present.     Mental Status: He is alert.     Cranial Nerves: No cranial nerve deficit.     Sensory: No sensory deficit.    ED Results / Procedures / Treatments   Labs (all labs ordered are listed, but only abnormal results are displayed) Labs Reviewed  PROTIME-INR - Abnormal; Notable for the following components:      Result Value   Prothrombin Time 20.9 (*)    INR 1.8 (*)    All other components within normal limits  CBC - Abnormal; Notable for the following components:   RBC 3.89 (*)    Hemoglobin 10.4 (*)    HCT 33.8 (*)    RDW 19.5 (*)    Platelets 436 (*)    All other components within normal limits  COMPREHENSIVE METABOLIC PANEL - Abnormal; Notable for the following components:   Glucose, Bld 143 (*)    BUN 29 (*)    Creatinine, Ser 2.12 (*)    Calcium 8.5 (*)    Total Protein 5.4 (*)    Albumin 2.0 (*)    AST 11 (*)    GFR, Estimated 33 (*)    All other components within normal limits  I-STAT CHEM 8, ED - Abnormal; Notable for the following components:   BUN 27 (*)    Creatinine, Ser 2.10 (*)    Glucose, Bld 139 (*)    Calcium, Ion 1.11 (*)    Hemoglobin 10.2 (*)    HCT 30.0 (*)    All other components within normal limits  CBG MONITORING, ED - Abnormal; Notable for the following components:   Glucose-Capillary 140 (*)    All other components within normal limits  RESP PANEL BY RT-PCR (FLU A&B, COVID) ARPGX2  ETHANOL  APTT  DIFFERENTIAL  RAPID URINE DRUG SCREEN, HOSP PERFORMED  URINALYSIS, ROUTINE W REFLEX MICROSCOPIC  EKG EKG Interpretation  Date/Time:  Saturday March 19 2021 21:40:55 EDT Ventricular Rate:  109 PR Interval:    QRS Duration: 82 QT Interval:  361 QTC Calculation: 487 R Axis:   23 Text Interpretation: Atrial  fibrillation Low voltage, extremity and precordial leads Nonspecific T abnormalities, lateral leads Borderline prolonged QT interval Confirmed by Dene Gentry [phone]) on 03/19/2021 10:35:23 PM  Radiology CT HEAD CODE STROKE WO CONTRAST  Result Date: 03/19/2021 CLINICAL DATA:  Right facial droop and aphasia EXAM: CT HEAD WITHOUT CONTRAST CT ANGIOGRAPHY OF THE HEAD AND NECK TECHNIQUE: Contiguous axial images were obtained from the base of the skull through the vertex without intravenous contrast. Multidetector CT imaging of the head and neck was performed using the standard protocol during bolus administration of intravenous contrast. Multiplanar CT image reconstructions and MIPs were obtained to evaluate the vascular anatomy. Carotid stenosis measurements (when applicable) are obtained utilizing NASCET criteria, using the distal internal carotid diameter as the denominator. CONTRAST:  67mL OMNIPAQUE IOHEXOL 350 MG/ML SOLN COMPARISON:  None. FINDINGS: CT HEAD FINDINGS Brain: There is no mass, hemorrhage or extra-axial collection. There is generalized atrophy without lobar predilection. Bifrontal encephalomalacia. There is hypoattenuation of the periventricular white matter, most commonly indicating chronic ischemic microangiopathy. Old left basal ganglia small vessel infarct. Skull: The visualized skull base, calvarium and extracranial soft tissues are normal. Sinuses/Orbits: No fluid levels or advanced mucosal thickening of the visualized paranasal sinuses. No mastoid or middle ear effusion. The orbits are normal. ASPECTS ([place] Stroke Program Early CT Score) - Ganglionic level infarction (caudate, lentiform nuclei, internal capsule, insula, M1-M3 cortex): 7 - Supraganglionic infarction (M4-M6 cortex): 3 Total score (0-10 with 10 being normal): 10 Review of the MIP images confirms the above findings CTA NECK FINDINGS SKELETON: There is no bony spinal canal stenosis. No lytic or blastic lesion. OTHER NECK:  Normal pharynx, larynx and major salivary glands. No cervical lymphadenopathy. Unremarkable thyroid gland. UPPER CHEST: Bibasilar atelectasis. AORTIC ARCH: There is no calcific atherosclerosis of the aortic arch. There is no aneurysm, dissection or hemodynamically significant stenosis of the visualized portion of the aorta. Conventional 3 vessel aortic branching pattern. The visualized proximal subclavian arteries are widely patent. RIGHT CAROTID SYSTEM: Normal without aneurysm, dissection or stenosis. LEFT CAROTID SYSTEM: Normal without aneurysm, dissection or stenosis. VERTEBRAL ARTERIES: Left dominant configuration. Both origins are clearly patent. There is no dissection, occlusion or flow-limiting stenosis to the skull base (V1-V3 segments). CTA HEAD FINDINGS POSTERIOR CIRCULATION: --Vertebral arteries: Normal V4 segments. --Inferior cerebellar arteries: Normal. --Basilar artery: Normal. --Superior cerebellar arteries: Normal. --Posterior cerebral arteries (PCA): Normal. ANTERIOR CIRCULATION: --Intracranial internal carotid arteries: Normal. 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 Left remains patent. --Middle cerebral arteries (MCA): Normal. VENOUS SINUSES: As permitted by contrast timing, patent. ANATOMIC VARIANTS: None Review of the MIP images confirms the above findings. IMPRESSION: 1. No acute intracranial abnormality.  ASPECTS is 10. 2. No emergent large vessel occlusion or high-grade stenosis. 3. 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 4. 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 These results were communicated to Lynnae Sandhoff at 9:50 pm on 03/19/2021 by text page via the [facility] messaging system. Electronically Signed   By: Ulyses Jarred M.D.   On: 03/19/2021 21:50   CT ANGIO HEAD CODE STROKE  Result Date: 03/19/2021 CLINICAL DATA:  Right facial droop and aphasia EXAM: CT HEAD WITHOUT CONTRAST  CT ANGIOGRAPHY OF THE HEAD AND NECK TECHNIQUE: Contiguous axial images were obtained  from the base of the skull through the vertex without intravenous contrast. Multidetector CT imaging of the head and neck was performed using the standard protocol during bolus administration of intravenous contrast. Multiplanar CT image reconstructions and MIPs were obtained to evaluate the vascular anatomy. Carotid stenosis measurements (when applicable) are obtained utilizing NASCET criteria, using the distal internal carotid diameter as the denominator. CONTRAST:  78mL OMNIPAQUE IOHEXOL 350 MG/ML SOLN COMPARISON:  None. FINDINGS: CT HEAD FINDINGS Brain: There is no mass, hemorrhage or extra-axial collection. There is generalized atrophy without lobar predilection. Bifrontal encephalomalacia. There is hypoattenuation of the periventricular white matter, most commonly indicating chronic ischemic microangiopathy. Old left basal ganglia small vessel infarct. Skull: The visualized skull base, calvarium and extracranial soft tissues are normal. Sinuses/Orbits: No fluid levels or advanced mucosal thickening of the visualized paranasal sinuses. No mastoid or middle ear effusion. The orbits are normal. ASPECTS ([place] Stroke Program Early CT Score) - Ganglionic level infarction (caudate, lentiform nuclei, internal capsule, insula, M1-M3 cortex): 7 - Supraganglionic infarction (M4-M6 cortex): 3 Total score (0-10 with 10 being normal): 10 Review of the MIP images confirms the above findings CTA NECK FINDINGS SKELETON: There is no bony spinal canal stenosis. No lytic or blastic lesion. OTHER NECK: Normal pharynx, larynx and major salivary glands. No cervical lymphadenopathy. Unremarkable thyroid gland. UPPER CHEST: Bibasilar atelectasis. AORTIC ARCH: There is no calcific atherosclerosis of the aortic arch. There is no aneurysm, dissection or hemodynamically significant stenosis of the visualized portion of the aorta. Conventional 3 vessel  aortic branching pattern. The visualized proximal subclavian arteries are widely patent. RIGHT CAROTID SYSTEM: Normal without aneurysm, dissection or stenosis. LEFT CAROTID SYSTEM: Normal without aneurysm, dissection or stenosis. VERTEBRAL ARTERIES: Left dominant configuration. Both origins are clearly patent. There is no dissection, occlusion or flow-limiting stenosis to the skull base (V1-V3 segments). CTA HEAD FINDINGS POSTERIOR CIRCULATION: --Vertebral arteries: Normal V4 segments. --Inferior cerebellar arteries: Normal. --Basilar artery: Normal. --Superior cerebellar arteries: Normal. --Posterior cerebral arteries (PCA): Normal. ANTERIOR CIRCULATION: --Intracranial internal carotid arteries: Normal. 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 Left remains patent. --Middle cerebral arteries (MCA): Normal. VENOUS SINUSES: As permitted by contrast timing, patent. ANATOMIC VARIANTS: None Review of the MIP images confirms the above findings. IMPRESSION: 1. No acute intracranial abnormality.  ASPECTS is 10. 2. No emergent large vessel occlusion or high-grade stenosis. 3. 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 4. 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 These results were communicated to Lynnae Sandhoff at 9:50 pm on 03/19/2021 by text page via the [facility] messaging system. Electronically Signed   By: Ulyses Jarred M.D.   On: 03/19/2021 21:50   CT ANGIO NECK CODE STROKE  Result Date: 03/19/2021 CLINICAL DATA:  Right facial droop and aphasia EXAM: CT HEAD WITHOUT CONTRAST CT ANGIOGRAPHY OF THE HEAD AND NECK TECHNIQUE: Contiguous axial images were obtained from the base of the skull through the vertex without intravenous contrast. Multidetector CT imaging of the head and neck was performed using the standard protocol during bolus administration of intravenous contrast. Multiplanar CT image reconstructions and MIPs  were obtained to evaluate the vascular anatomy. Carotid stenosis measurements (when applicable) are obtained utilizing NASCET criteria, using the distal internal carotid diameter as the denominator. CONTRAST:  72mL OMNIPAQUE IOHEXOL 350 MG/ML SOLN COMPARISON:  None. FINDINGS: CT HEAD FINDINGS Brain: There is no mass, hemorrhage or extra-axial collection. There is generalized atrophy without lobar predilection. Bifrontal encephalomalacia. There is hypoattenuation of the periventricular  white matter, most commonly indicating chronic ischemic microangiopathy. Old left basal ganglia small vessel infarct. Skull: The visualized skull base, calvarium and extracranial soft tissues are normal. Sinuses/Orbits: No fluid levels or advanced mucosal thickening of the visualized paranasal sinuses. No mastoid or middle ear effusion. The orbits are normal. ASPECTS ([place] Stroke Program Early CT Score) - Ganglionic level infarction (caudate, lentiform nuclei, internal capsule, insula, M1-M3 cortex): 7 - Supraganglionic infarction (M4-M6 cortex): 3 Total score (0-10 with 10 being normal): 10 Review of the MIP images confirms the above findings CTA NECK FINDINGS SKELETON: There is no bony spinal canal stenosis. No lytic or blastic lesion. OTHER NECK: Normal pharynx, larynx and major salivary glands. No cervical lymphadenopathy. Unremarkable thyroid gland. UPPER CHEST: Bibasilar atelectasis. AORTIC ARCH: There is no calcific atherosclerosis of the aortic arch. There is no aneurysm, dissection or hemodynamically significant stenosis of the visualized portion of the aorta. Conventional 3 vessel aortic branching pattern. The visualized proximal subclavian arteries are widely patent. RIGHT CAROTID SYSTEM: Normal without aneurysm, dissection or stenosis. LEFT CAROTID SYSTEM: Normal without aneurysm, dissection or stenosis. VERTEBRAL ARTERIES: Left dominant configuration. Both origins are clearly patent. There is no dissection, occlusion or  flow-limiting stenosis to the skull base (V1-V3 segments). CTA HEAD FINDINGS POSTERIOR CIRCULATION: --Vertebral arteries: Normal V4 segments. --Inferior cerebellar arteries: Normal. --Basilar artery: Normal. --Superior cerebellar arteries: Normal. --Posterior cerebral arteries (PCA): Normal. ANTERIOR CIRCULATION: --Intracranial internal carotid arteries: Normal. 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 Left remains patent. --Middle cerebral arteries (MCA): Normal. VENOUS SINUSES: As permitted by contrast timing, patent. ANATOMIC VARIANTS: None Review of the MIP images confirms the above findings. IMPRESSION: 1. No acute intracranial abnormality.  ASPECTS is 10. 2. No emergent large vessel occlusion or high-grade stenosis. 3. 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 4. 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 These results were communicated to Marisue Humble at 9:50 pm on 03/19/2021 by text page via the [facility] messaging system. Electronically Signed   By: Deatra Robinson M.D.   On: 03/19/2021 21:50    Procedures Procedures   Medications Ordered in ED Medications  valproate (DEPACON) 1,000 mg in dextrose 5 % 50 mL IVPB (has no administration in time range)  iohexol (OMNIPAQUE) 350 MG/ML injection 50 mL (50 mLs Intravenous Contrast Given 03/19/21 2125)    ED Course  I have reviewed the triage vital signs and the nursing notes.  Pertinent labs & imaging results that were available during my care of the patient were reviewed by me and considered in my medical decision making (see chart for details).    MDM Rules/Calculators/A&P                          MDM  MSE complete  Jeffery Christian was evaluated in Emergency Department on 03/19/2021 for the symptoms described in the history of present illness. He was evaluated in the context of the global COVID-19 pandemic, which necessitated consideration that the  patient might be at risk for infection with the SARS-CoV-2 virus that causes COVID-19. Institutional protocols and algorithms that pertain to the evaluation of patients at risk for COVID-19 are in a state of rapid change based on information released by regulatory bodies including the CDC and federal and state organizations. These policies and algorithms were followed during the patient's care in the ED.   Patient presented with possible code stroke.  Patient seen by neuro team on arrival.  Patient is not candidate for tPA.  Patient is possibly symptomatic after unwitnessed seizure per neuro.  Depakote load ordered by Neuro - Dr. Theda Sers.   Patient would benefit from overnight observation for the neuro team.  Hospitalist team aware of case.  Final Clinical Impression(s) / ED Diagnoses Final diagnoses:  Altered mental status, unspecified altered mental status type    Rx / DC Orders ED Discharge Orders     None        Valarie Merino, MD 03/19/21 2255

## 2021-03-19 NOTE — Code Documentation (Signed)
Paged for code stroke at 2058  Patient arrived at 2110 CT head neg CT HEAD ANGIO AND NECK ordered  Patient presents with right side facial droop and aphasia.  LKW 1730 then night nurse went into room around 1930 and patient had stroke like symptoms.   Patient just recently discharged to [facility] after being treated for urosepsis.

## 2021-03-20 ENCOUNTER — Inpatient Hospital Stay (HOSPITAL_COMMUNITY): Payer: Medicare Other

## 2021-03-20 DIAGNOSIS — D509 Iron deficiency anemia, unspecified: Secondary | ICD-10-CM | POA: Diagnosis present

## 2021-03-20 DIAGNOSIS — Z7401 Bed confinement status: Secondary | ICD-10-CM | POA: Diagnosis not present

## 2021-03-20 DIAGNOSIS — N179 Acute kidney failure, unspecified: Secondary | ICD-10-CM | POA: Diagnosis not present

## 2021-03-20 DIAGNOSIS — I130 Hypertensive heart and chronic kidney disease with heart failure and stage 1 through stage 4 chronic kidney disease, or unspecified chronic kidney disease: Secondary | ICD-10-CM | POA: Diagnosis present

## 2021-03-20 DIAGNOSIS — I4819 Other persistent atrial fibrillation: Secondary | ICD-10-CM

## 2021-03-20 DIAGNOSIS — E785 Hyperlipidemia, unspecified: Secondary | ICD-10-CM | POA: Diagnosis present

## 2021-03-20 DIAGNOSIS — G9341 Metabolic encephalopathy: Secondary | ICD-10-CM | POA: Diagnosis not present

## 2021-03-20 DIAGNOSIS — M50222 Other cervical disc displacement at C5-C6 level: Secondary | ICD-10-CM | POA: Diagnosis not present

## 2021-03-20 DIAGNOSIS — R29810 Facial weakness: Secondary | ICD-10-CM | POA: Diagnosis present

## 2021-03-20 DIAGNOSIS — R531 Weakness: Secondary | ICD-10-CM | POA: Diagnosis not present

## 2021-03-20 DIAGNOSIS — I6611 Occlusion and stenosis of right anterior cerebral artery: Secondary | ICD-10-CM | POA: Diagnosis present

## 2021-03-20 DIAGNOSIS — N1831 Chronic kidney disease, stage 3a: Secondary | ICD-10-CM | POA: Diagnosis not present

## 2021-03-20 DIAGNOSIS — L89154 Pressure ulcer of sacral region, stage 4: Secondary | ICD-10-CM | POA: Diagnosis present

## 2021-03-20 DIAGNOSIS — I5022 Chronic systolic (congestive) heart failure: Secondary | ICD-10-CM | POA: Diagnosis present

## 2021-03-20 DIAGNOSIS — R0902 Hypoxemia: Secondary | ICD-10-CM | POA: Diagnosis not present

## 2021-03-20 DIAGNOSIS — D638 Anemia in other chronic diseases classified elsewhere: Secondary | ICD-10-CM | POA: Diagnosis present

## 2021-03-20 DIAGNOSIS — R4182 Altered mental status, unspecified: Secondary | ICD-10-CM | POA: Diagnosis not present

## 2021-03-20 DIAGNOSIS — G9340 Encephalopathy, unspecified: Secondary | ICD-10-CM

## 2021-03-20 DIAGNOSIS — M4628 Osteomyelitis of vertebra, sacral and sacrococcygeal region: Secondary | ICD-10-CM | POA: Diagnosis not present

## 2021-03-20 DIAGNOSIS — E538 Deficiency of other specified B group vitamins: Secondary | ICD-10-CM | POA: Diagnosis present

## 2021-03-20 DIAGNOSIS — R569 Unspecified convulsions: Secondary | ICD-10-CM

## 2021-03-20 DIAGNOSIS — N182 Chronic kidney disease, stage 2 (mild): Secondary | ICD-10-CM | POA: Diagnosis present

## 2021-03-20 DIAGNOSIS — M4802 Spinal stenosis, cervical region: Secondary | ICD-10-CM | POA: Diagnosis not present

## 2021-03-20 DIAGNOSIS — Z7901 Long term (current) use of anticoagulants: Secondary | ICD-10-CM | POA: Diagnosis not present

## 2021-03-20 DIAGNOSIS — Z66 Do not resuscitate: Secondary | ICD-10-CM | POA: Diagnosis present

## 2021-03-20 DIAGNOSIS — Z20822 Contact with and (suspected) exposure to covid-19: Secondary | ICD-10-CM | POA: Diagnosis present

## 2021-03-20 DIAGNOSIS — I4891 Unspecified atrial fibrillation: Secondary | ICD-10-CM

## 2021-03-20 DIAGNOSIS — M50221 Other cervical disc displacement at C4-C5 level: Secondary | ICD-10-CM | POA: Diagnosis not present

## 2021-03-20 DIAGNOSIS — Z993 Dependence on wheelchair: Secondary | ICD-10-CM | POA: Diagnosis not present

## 2021-03-20 DIAGNOSIS — M47814 Spondylosis without myelopathy or radiculopathy, thoracic region: Secondary | ICD-10-CM | POA: Diagnosis not present

## 2021-03-20 DIAGNOSIS — M5124 Other intervertebral disc displacement, thoracic region: Secondary | ICD-10-CM | POA: Diagnosis not present

## 2021-03-20 DIAGNOSIS — L98429 Non-pressure chronic ulcer of back with unspecified severity: Secondary | ICD-10-CM | POA: Diagnosis present

## 2021-03-20 DIAGNOSIS — N390 Urinary tract infection, site not specified: Secondary | ICD-10-CM | POA: Diagnosis present

## 2021-03-20 DIAGNOSIS — Z743 Need for continuous supervision: Secondary | ICD-10-CM | POA: Diagnosis not present

## 2021-03-20 DIAGNOSIS — G9389 Other specified disorders of brain: Secondary | ICD-10-CM | POA: Diagnosis present

## 2021-03-20 DIAGNOSIS — I959 Hypotension, unspecified: Secondary | ICD-10-CM | POA: Diagnosis not present

## 2021-03-20 LAB — RAPID URINE DRUG SCREEN, HOSP PERFORMED
Amphetamines: NOT DETECTED
Barbiturates: NOT DETECTED
Benzodiazepines: NOT DETECTED
Cocaine: NOT DETECTED
Opiates: NOT DETECTED
Tetrahydrocannabinol: NOT DETECTED

## 2021-03-20 LAB — URINALYSIS, MICROSCOPIC (REFLEX)
RBC / HPF: >50 RBC/hpf (ref 0–5)
Squamous Epithelial / HPF: NONE SEEN (ref 0–5)
WBC, UA: >50 WBC/hpf (ref 0–5)

## 2021-03-20 LAB — COMPREHENSIVE METABOLIC PANEL
ALT: 8 U/L (ref 0–44)
AST: 10 U/L — ABNORMAL LOW (ref 15–41)
Albumin: 2 g/dL — ABNORMAL LOW (ref 3.5–5.0)
Alkaline Phosphatase: 44 U/L (ref 38–126)
Anion gap: 8 (ref 5–15)
BUN: 30 mg/dL — ABNORMAL HIGH (ref 8–23)
CO2: 26 mmol/L (ref 22–32)
Calcium: 8.7 mg/dL — ABNORMAL LOW (ref 8.9–10.3)
Chloride: 108 mmol/L (ref 98–111)
Creatinine, Ser: 2.16 mg/dL — ABNORMAL HIGH (ref 0.61–1.24)
GFR, Estimated: 33 mL/min — ABNORMAL LOW (ref >60)
Glucose, Bld: 109 mg/dL — ABNORMAL HIGH (ref 70–99)
Potassium: 4.7 mmol/L (ref 3.5–5.1)
Sodium: 142 mmol/L (ref 135–145)
Total Bilirubin: 0.7 mg/dL (ref 0.3–1.2)
Total Protein: 5.6 g/dL — ABNORMAL LOW (ref 6.5–8.1)

## 2021-03-20 LAB — URINALYSIS, ROUTINE W REFLEX MICROSCOPIC
Bilirubin Urine: NEGATIVE
Glucose, UA: NEGATIVE mg/dL
Ketones, ur: NEGATIVE mg/dL
Nitrite: NEGATIVE
Protein, ur: NEGATIVE mg/dL
Specific Gravity, Urine: 1.01 (ref 1.005–1.030)
pH: 7 (ref 5.0–8.0)

## 2021-03-20 LAB — CBC
HCT: 33.9 % — ABNORMAL LOW (ref 39.0–52.0)
Hemoglobin: 10.6 g/dL — ABNORMAL LOW (ref 13.0–17.0)
MCH: 26.5 pg (ref 26.0–34.0)
MCHC: 31.3 g/dL (ref 30.0–36.0)
MCV: 84.8 fL (ref 80.0–100.0)
Platelets: 434 10*3/uL — ABNORMAL HIGH (ref 150–400)
RBC: 4 MIL/uL — ABNORMAL LOW (ref 4.22–5.81)
RDW: 19.1 % — ABNORMAL HIGH (ref 11.5–15.5)
WBC: 10.6 10*3/uL — ABNORMAL HIGH (ref 4.0–10.5)
nRBC: 0 % (ref 0.0–0.2)

## 2021-03-20 LAB — RESP PANEL BY RT-PCR (FLU A&B, COVID) ARPGX2
Influenza A by PCR: NEGATIVE
Influenza B by PCR: NEGATIVE
SARS Coronavirus 2 by RT PCR: NEGATIVE

## 2021-03-20 LAB — MAGNESIUM: Magnesium: 2.1 mg/dL (ref 1.7–2.4)

## 2021-03-20 LAB — PHOSPHORUS: Phosphorus: 4.9 mg/dL — ABNORMAL HIGH (ref 2.5–4.6)

## 2021-03-20 MED ORDER — TRAMADOL HCL 50 MG PO TABS
25.0000 mg | ORAL_TABLET | Freq: Two times a day (BID) | ORAL | Status: DC
  Administered 2021-03-20 – 2021-03-23 (×6): 25 mg via ORAL
  Filled 2021-03-20 (×6): qty 1

## 2021-03-20 MED ORDER — DILTIAZEM HCL 25 MG/5ML IV SOLN
15.0000 mg | Freq: Once | INTRAVENOUS | Status: AC
  Administered 2021-03-20: 15 mg via INTRAVENOUS
  Filled 2021-03-20: qty 5

## 2021-03-20 MED ORDER — FERROUS SULFATE 325 (65 FE) MG PO TABS
325.0000 mg | ORAL_TABLET | Freq: Two times a day (BID) | ORAL | Status: DC
  Administered 2021-03-20 – 2021-03-23 (×6): 325 mg via ORAL
  Filled 2021-03-20 (×7): qty 1

## 2021-03-20 MED ORDER — COLCHICINE 0.6 MG PO TABS: 0.6000 mg | ORAL_TABLET | Freq: Every day | ORAL | Status: DC | PRN

## 2021-03-20 MED ORDER — DIVALPROEX SODIUM 125 MG PO CSDR
250.0000 mg | DELAYED_RELEASE_CAPSULE | Freq: Two times a day (BID) | ORAL | Status: DC
  Administered 2021-03-20 – 2021-03-23 (×6): 250 mg via ORAL
  Filled 2021-03-20 (×8): qty 2

## 2021-03-20 MED ORDER — DILTIAZEM HCL-DEXTROSE 125-5 MG/125ML-% IV SOLN (PREMIX)
5.0000 mg/h | INTRAVENOUS | Status: DC
  Administered 2021-03-20: 10 mg/h via INTRAVENOUS
  Administered 2021-03-20: 5 mg/h via INTRAVENOUS
  Filled 2021-03-20 (×4): qty 125

## 2021-03-20 MED ORDER — DILTIAZEM HCL 60 MG PO TABS
60.0000 mg | ORAL_TABLET | Freq: Three times a day (TID) | ORAL | Status: DC
  Administered 2021-03-20 – 2021-03-23 (×10): 60 mg via ORAL
  Filled 2021-03-20 (×12): qty 1

## 2021-03-20 MED ORDER — SODIUM CHLORIDE 0.9 % IV BOLUS
500.0000 mL | Freq: Once | INTRAVENOUS | Status: AC
  Administered 2021-03-20: 500 mL via INTRAVENOUS

## 2021-03-20 NOTE — ED Notes (Signed)
MD Opyd notified of sustained HR 115-130

## 2021-03-20 NOTE — Consult Note (Signed)
Neurology Consult H&P  Jeffery Christian MR# [id number] 03/20/2021   CC: code stroke  History is obtained from: EMS, chart and patient  HPI: Jeffery Christian is a 70 y.o. male left Christian past medical history as reviewed below transported from nursing home as code stroke due to altered mental status mentally nonverbal and questionable facial droop.  Chart review shows he was he was seen for the same symptoms about 8 days PTA and subsequently found to have urosepsis s/p PICC line and admitted to the nursing home for further care.  Chart review also reveals the patient was recently started on VPA for seizure.   LKW: 0086 tpa given: No seizure IR Thrombectomy No lvo Modified Rankin score unclear NIHSS: 0-1 waxing and waning slowness of speech  ROS: Unable to assess due to encephalopathy.  Past Medical History:  Diagnosis Date   Chronic kidney disease    Chronic systolic CHF (congestive heart failure) (HCC)    EF 35-40, diffuse HK, mild MR, moderate LAE, mild RAE, PASP 44, L pleural eff   Dilated cardiomyopathy (HCC)    likely related to tachycardia   Dysrhythmia    Persistent atrial fibrillation (HCC)      Family History  Problem Relation Age of Onset   Leukemia Father        61   Cancer Mother     Social History:  reports that he has never smoked. He has never used smokeless tobacco. He reports previous alcohol use. He reports that he does not use drugs.   Prior to Admission medications   Medication Sig Start Date End Date Taking? Authorizing Provider  acetaminophen (TYLENOL) 500 MG tablet Take 500 mg by mouth every 6 (six) hours as needed for mild pain.   Yes [provider]  acetaminophen (TYLENOL) 650 MG suppository Place 650 mg rectally every 6 (six) hours as needed for fever.   Yes [provider]  allopurinol (ZYLOPRIM) 300 MG tablet Take 150 mg by mouth daily.   Yes [provider]  Amino Acids-Protein Hydrolys (PRO-STAT PO) Take 30 mLs by mouth 2  (two) times daily.   Yes [provider]  apixaban (ELIQUIS) 5 MG TABS tablet Take 1 tablet (5 mg total) by mouth 2 (two) times daily. 03/16/21 03/16/22 Yes Thurnell Lose, MD  atorvastatin (LIPITOR) 20 MG tablet Take 1 tablet (20 mg total) by mouth every evening. 03/16/21 03/16/22 Yes Thurnell Lose, MD  bisacodyl (DULCOLAX) 10 MG suppository Place 10 mg rectally See admin instructions. If not relieved by milk of mag, give $RemoveB'10mg'ARKSACgl$  bisocodyl suppository rectally for one dose in 24 hours as needed for constipation   Yes [provider]  cefTRIAXone (ROCEPHIN) IVPB Inject 2 g into the vein daily for 10 days. Indication:  Sacral osteomyelitis First Dose: Yes Last Day of Therapy:  03/25/21 Labs - Once weekly:  CBC/D and BMP, Labs - Every other week:  ESR and CRP Method of administration: IV Push Method of administration may be changed at the discretion of home infusion pharmacist based upon assessment of the patient and/or caregiver's ability to self-administer the medication ordered. 03/16/21 03/26/21 Yes Thurnell Lose, MD  colchicine 0.6 MG tablet Take 0.6 mg by mouth See admin instructions. 1.2 MG orally AT FIRST SIGN OF GOUT FLARE UP AND REPEAT 0.6 MG IN 1 HOUR IF SYMPTOMS PERSIST   Yes [provider]  diltiazem (CARDIZEM) 60 MG tablet Take 60 mg by mouth every 8 (eight) hours. Hold for SBP <  100 and Heart rate < 60   Yes [provider]  divalproex (DEPAKOTE SPRINKLE) 125 MG capsule Take 250 mg by mouth 2 (two) times daily.   Yes [provider]  feeding supplement (ENSURE ENLIVE / ENSURE PLUS) LIQD Take 237 mLs by mouth 2 (two) times daily between meals. 11/16/20  Yes Elgergawy, Silver Huguenin, MD  ferrous sulfate 325 (65 FE) MG tablet Take 325 mg by mouth 2 (two) times daily with a meal.   Yes [provider]  Magnesium Hydroxide (MILK OF MAGNESIA PO) Take 30 mLs by mouth daily as needed (if no bowel movement in 3 days).   Yes [provider]  midodrine (PROAMATINE) 10 MG tablet Take 1 tablet (10 mg total) by mouth 3 (three) times daily with meals. 03/16/21  Yes Thurnell Lose, MD  pantoprazole (PROTONIX) 40 MG tablet Take 1 tablet (40 mg total) by mouth daily. 11/16/20  Yes Elgergawy, Silver Huguenin, MD  polyethylene glycol (MIRALAX / GLYCOLAX) 17 g packet Take 17 g by mouth daily.   Yes [provider]  Sodium Phosphates (RA SALINE ENEMA) 19-7 GM/118ML ENEM Place 118 mLs rectally See admin instructions. If not relieved by biscodyl suppository, give disposable saline enemal rectally for one dose in 24 hours as needed for constipation   Yes [provider]  tamsulosin (FLOMAX) 0.4 MG CAPS capsule Take 1 capsule (0.4 mg total) by mouth daily after supper. 11/16/20  Yes Elgergawy, Silver Huguenin, MD  traMADol (ULTRAM) 50 MG tablet Take 0.5 tablets (25 mg total) by mouth 2 (two) times daily. 03/16/21  Yes Thurnell Lose, MD  vitamin C (ASCORBIC ACID) 500 MG tablet Take 500 mg by mouth 2 (two) times daily.   Yes [provider]    Exam: Current vital signs: BP 121/89   Pulse (!) 106   Temp 97.8 F (36.6 C) (Oral)   Resp (!) 23   Wt 85.5 kg   SpO2 98%   BMI 25.57 kg/m   Physical Exam  Constitutional: Appears well-developed and well-nourished.  Psych: Affect appropriate to situation Eyes: No scleral injection HENT: No OP obstruction. Head: Normocephalic.  Cardiovascular: Normal rate and regular rhythm.  Respiratory: Effort normal, symmetric excursions bilaterally, no audible wheezing. GI: Soft.  No distension. There is no tenderness.  Skin: WDI  Neuro: Mental Status: Patient is awake, alert, oriented to person, place Patient is not able to give a clear and coherent history. Speech bradylalia was fluent, with intact comprehension and repetition. No clear signs of aphasia or neglect. Visual Fields are full. Pupils are equal, round, and reactive to light. EOMI without ptosis or diploplia.  Facial  sensation is symmetric to temperature Facial movement is symmetric.  Hearing is intact to voice. Uvula midline and palate elevates symmetrically. Shoulder shrug is symmetric. Tongue is midline without atrophy or fasciculations.  Tone is normal. Bulk is normal. 5/5 strength was present in all four extremities. Sensation is symmetric to light touch and temperature in the arms and legs. Deep Tendon Reflexes: 2+ and symmetric in the biceps and patellae. Toes are downgoing bilaterally. FNF and HKS are intact bilaterally. Gait - Deferred  I have reviewed labs in epic and the pertinent results are: No lab results at the time of evaluation.  I have reviewed the images obtained: NCT head/ CTA head and neck showed No acute intracranial abnormality.  ASPECTS is 10. No emergent large vessel occlusion or high-grade stenosis. 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   Assessment: Benjamyn Hestand is a 70 y.o. male Christian PMHx as noted above brought in as code stroke for waxing and waning bradylalia.  Medical record also reveals that the patient may have some level of cognitive decline thus complicating evaluation.  CT head showed frontal encephalomalacia and review of nursing home records shows the patient was recently started on VPA for seizure.  Contacted on-call physician from the nursing home who verified valproic acid and will therefore load patient with valproic acid, EEG and further MR imaging.  Patient does have history of atrial fibrillation and if MRI reveals stroke then we will stroke then will pursue stroke work-up.  Impression:  Acute cephalopathy Bradylalia Seizure Atrial fibrillation however outpatient MAR does not show anticoagulation and was started on apixaban. Urosepsis with PICC line on IV antibiotics.  Patient was loaded with VPA and follow-up exam revealed patient to have improved speech and  was able to confirm he has had seizures in the past.  Plan: - MRI brain without contrast. - Increase valproic acid DR to $Rem'500mg'xTQZ$  bid - Routine EEG to eval for any epileptogenic discharges. - Recommend metabolic/infectious workup with CBC, CMP, UA with UCx, CXR, CK, serum lactate.   This patient is critically ill and at significant risk of neurological worsening, death and care requires constant monitoring of vital signs, hemodynamics,respiratory and cardiac monitoring, neurological assessment, discussion with family, other specialists and medical decision making of high complexity. I spent 74 minutes of neurocritical care time  in the care of  this patient. This was time spent independent of any time provided by nurse practitioner or PA.  Electronically signed by:  Lynnae Sandhoff, MD Page: [phone] 03/20/2021, 5:37 AM

## 2021-03-20 NOTE — Progress Notes (Signed)
EEG complete - results pending 

## 2021-03-20 NOTE — Progress Notes (Signed)
PROGRESS NOTE    Jeffery Christian  FAO:[id number] DOB: January 03, 1951 DOA: 03/19/2021 PCP: Hoy Register, MD    Brief Narrative:  Jeffery Christian was admitted to the hospital with the working diagnosis of metabolic encephalopathy.   71 year old male past medical history for chronic atrial fibrillation, chronic kidney disease stage III, hypertension, prediabetes, gout and sacral osteomyelitis who presented with altered mental status.  Patient is a nursing home resident, he was found to have acute changes mentation, possible facial droop that prompted him to be brought to the hospital for further evaluation.  On initial physical examination he was alert but confused, disoriented, heart rate 110 bpm, respiratory rate 27, blood pressure 87/68, 116/97, temperature 97.8.  His lungs are clear to auscultation bilaterally, heart S1-S2, irregularly irregular, abdomen soft, no lower extremity edema.  Sodium 139, potassium 4.3, chloride 104, bicarb 26, glucose 143, BUN 29, creatinine 2.12, white count 9.9, hemoglobin 10.4, hematocrit 33.8, platelets 436. SARS COVID-19 negative.  Urinalysis specific gravity 1.010, > 50 red cells,> 50 white cells.  Head/neck CT angiography with no acute intracranial changes, no emergent large vessel occlusion.  Bifrontal encephalomalacia.  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  EKG 127 bpm, normal axis, normal intervals, atrial fibrillation rhythm, no ST segment elevation or depression, low voltage.  Assessment & Plan:   Active Problems:   Persistent atrial fibrillation (HCC)   Atrial fibrillation with rapid ventricular response (HCC)   Acute renal failure superimposed on stage 3 chronic kidney disease (HCC)   Sacral osteomyelitis (HCC)   Metabolic encephalopathy   Acute metabolic encephalopathy in the setting of urinary tract infection. Patient is awake and alert, he is disorientated but not agitated.   Plan to continue supportive medical therapy, with IV  antibiotic therapy, neuro checks per unit protocol, PT and OT.  I suspect patient has some baseline cognitive impairment.   No clinical sings of active seizures. EEG with no seizure activity.  Resume divalproex.   2. Atrial fibrillation with rapid ventricular response. Patient placed on diltiazem infusion for rate control.  Will resume oral diltazem and slowly wean off diltiazem infusion, target heart rate less than 130 bpm.   Continue anticoagulation with apixaban.   3. AKI renal function with serum cr at 2,1 with K at 4,7 and serum bicarbonate at 26, Continue close follow up on renal function and electrolytes. Patient is tolerating po well.  Discontinue IV fluids.   4. HTN/ dyslipidemia Blood pressure 114/80 mmHg, continue with midodrine to prevent hypotension   Continue with atorvastatin  5. Chronic sacral osteomyelitis. Present on admission, will consult wound care team.   6. Anemia of chronic disease. Continue with iron supplementation, cell count has been stable.   Patient continue to be at high risk for worsening encephalopathy and atrial fibrillation   Status is: Inpatient  Remains inpatient appropriate because:IV treatments appropriate due to intensity of illness or inability to take PO  Dispo: The patient is from: SNF              Anticipated d/c is to: SNF              Patient currently is not medically stable to d/c.   Difficult to place patient No  DVT prophylaxis: Apixaban   Code Status:   DNR   Family Communication:  No family at the bedside       Skin Documentation: Pressure Injury Buttocks Right Unstageable - Full thickness tissue loss in which the base of the injury is  covered by slough (yellow, tan, gray, green or brown) and/or eschar (tan, brown or black) in the wound bed. (Active)     Location: Buttocks  Location Orientation: Right  Staging: Unstageable - Full thickness tissue loss in which the base of the injury is covered by slough (yellow, tan,  gray, green or brown) and/or eschar (tan, brown or black) in the wound bed.  Wound Description (Comments):   Present on Admission: Yes     Antimicrobials:  Ceftriaxone     Subjective: Patient is feeling better, no nausea or vomiting, no agitation, but coe to be confused.   Objective: Vitals:   03/20/21 1317 03/20/21 1400 03/20/21 1415 03/20/21 1430  BP:  116/83 137/87 115/85  Pulse: (!) 105 98 (!) 111 (!) 102  Resp: 11 (!) 21 18 18   Temp:      TempSrc:      SpO2: 100% 97% 98% 100%  Weight:        Intake/Output Summary (Last 24 hours) at 03/20/2021 1522 Last data filed at 03/20/2021 1152 Gross per 24 hour  Intake 600 ml  Output --  Net 600 ml   Filed Weights   03/19/21 2100  Weight: 85.5 kg    Examination:   General: Not in pain or dyspnea. Deconditioned  Neurology: Awake and alert, non focal , confused and disorientated.  E ENT: mild pallor, no icterus, oral mucosa moist Cardiovascular: No JVD. S1-S2 present, irregularly irregular with no gallops, rubs, or murmurs. No lower extremity edema. Pulmonary: positive breath sounds bilaterally, adequate air movement, no wheezing, rhonchi or rales. Gastrointestinal. Abdomen soft and non tender Skin. No rashes Musculoskeletal: no joint deformities     Data Reviewed: I have personally reviewed following labs and imaging studies  CBC: Recent Labs  Lab 03/14/21 0405 03/15/21 0348 03/16/21 0320 03/19/21 2115 03/19/21 2120 03/20/21 0429  WBC 7.9 8.6 10.0 9.9  --  10.6*  NEUTROABS 5.5 5.9 7.2 6.8  --   --   HGB 9.6* 9.9* 9.9* 10.4* 10.2* 10.6*  HCT 31.2* 31.7* 31.3* 33.8* 30.0* 33.9*  MCV 85.0 84.3 84.4 86.9  --  84.8  PLT 239 262 273 436*  --  434*   Basic Metabolic Panel: Recent Labs  Lab 03/14/21 0405 03/15/21 0348 03/16/21 0320 03/19/21 2115 03/19/21 2120 03/20/21 0429  NA 140 140 143 139 142 142  K 3.6 3.6 3.9 4.3 4.3 4.7  CL 106 106 106 104 105 108  CO2 26 26 28 26   --  26  GLUCOSE 106* 105* 102*  143* 139* 109*  BUN 19 23 22  29* 27* 30*  CREATININE 1.06 1.16 1.30* 2.12* 2.10* 2.16*  CALCIUM 8.4* 8.5* 8.7* 8.5*  --  8.7*  MG 2.2 1.9 1.9  --   --  2.1  PHOS  --   --   --   --   --  4.9*   GFR: Estimated Creatinine Clearance: 35.9 mL/min (A) (by C-G formula based on SCr of 2.16 mg/dL (H)). Liver Function Tests: Recent Labs  Lab 03/14/21 0405 03/15/21 0348 03/16/21 0320 03/19/21 2115 03/20/21 0429  AST 12* 14* 13* 11* 10*  ALT 6 9 9 10 8   ALKPHOS 41 42 41 44 44  BILITOT 0.6 0.5 0.5 0.4 0.7  PROT 5.1* 5.1* 5.0* 5.4* 5.6*  ALBUMIN 1.9* 2.0* 2.0* 2.0* 2.0*   No results for input(s): LIPASE, AMYLASE in the last 168 hours. No results for input(s): AMMONIA in the last 168 hours. Coagulation Profile: Recent Labs  Lab 03/19/21 2115  INR 1.8*   Cardiac Enzymes: No results for input(s): CKTOTAL, CKMB, CKMBINDEX, TROPONINI in the last 168 hours. BNP (last 3 results) No results for input(s): PROBNP in the last 8760 hours. HbA1C: No results for input(s): HGBA1C in the last 72 hours. CBG: Recent Labs  Lab 03/19/21 2112  GLUCAP 140*   Lipid Profile: No results for input(s): CHOL, HDL, LDLCALC, TRIG, CHOLHDL, LDLDIRECT in the last 72 hours. Thyroid Function Tests: No results for input(s): TSH, T4TOTAL, FREET4, T3FREE, THYROIDAB in the last 72 hours. Anemia Panel: No results for input(s): VITAMINB12, FOLATE, FERRITIN, TIBC, IRON, RETICCTPCT in the last 72 hours.    Radiology Studies: I have reviewed all of the imaging during this hospital visit personally     Scheduled Meds:  allopurinol  150 mg Oral Daily   apixaban  5 mg Oral BID   vitamin C  500 mg Oral BID   atorvastatin  20 mg Oral QPM   feeding supplement  237 mL Oral BID BM   metroNIDAZOLE  500 mg Oral Q8H   midodrine  10 mg Oral TID WC   pantoprazole  40 mg Oral Daily   polyethylene glycol  17 g Oral Daily   tamsulosin  0.4 mg Oral QPC supper   Continuous Infusions:  cefTRIAXone (ROCEPHIN)  IV  Stopped (03/20/21 1152)   diltiazem (CARDIZEM) infusion 15 mg/hr (03/20/21 1033)   lactated ringers Stopped (03/20/21 0839)     LOS: 0 days        Jeffery Christian Annett Gula, MD

## 2021-03-20 NOTE — Procedures (Signed)
Patient Name: Jeffery Christian  MRN: [id number]  Epilepsy Attending: Charlsie Quest  Referring Physician/Provider: Dr Marisue Humble Date: 03/20/2021 Duration: 26.10 mins  Patient history: 70 y.o. male  brought in as code stroke for waxing and waning bradylalia. EEG to evaluate for seizure  Level of alertness: Awake, asleep  AEDs during EEG study: VPA  Technical aspects: This EEG study was done with scalp electrodes positioned according to the 10-20 International system of electrode placement. Electrical activity was acquired at a sampling rate of 500Hz  and reviewed with a high frequency filter of 70Hz  and a low frequency filter of 1Hz . EEG data were recorded continuously and digitally stored.   Description: The posterior dominant rhythm consists of 8-9 Hz activity of moderate voltage (25-35 uV) seen predominantly in posterior head regions, symmetric and reactive to eye opening and eye closing. Sleep was characterized by vertex waves, sleep spindles (12 to 14 Hz), maximal frontocentral region. Hyperventilation and photic stimulation were not performed.     IMPRESSION: This study is within normal limits. No seizures or epileptiform discharges were seen throughout the recording.  Jeffery Christian 

## 2021-03-20 NOTE — ED Notes (Signed)
Attempted to give reportx1 

## 2021-03-20 NOTE — Progress Notes (Signed)
Cross-coverage note:   Patient in rapid atrial fibrillation. SBP is 119. LV EF was normal in Feb 2022.   Plan to give 15 mg diltiazem injection.

## 2021-03-21 ENCOUNTER — Inpatient Hospital Stay (HOSPITAL_COMMUNITY): Payer: Medicare Other

## 2021-03-21 DIAGNOSIS — R4182 Altered mental status, unspecified: Secondary | ICD-10-CM

## 2021-03-21 LAB — CBC WITH DIFFERENTIAL/PLATELET
Abs Immature Granulocytes: 0.06 10*3/uL (ref 0.00–0.07)
Basophils Absolute: 0 10*3/uL (ref 0.0–0.1)
Basophils Relative: 0 %
Eosinophils Absolute: 0.1 10*3/uL (ref 0.0–0.5)
Eosinophils Relative: 1 %
HCT: 33.4 % — ABNORMAL LOW (ref 39.0–52.0)
Hemoglobin: 10.3 g/dL — ABNORMAL LOW (ref 13.0–17.0)
Immature Granulocytes: 1 %
Lymphocytes Relative: 13 %
Lymphs Abs: 1.4 10*3/uL (ref 0.7–4.0)
MCH: 26.3 pg (ref 26.0–34.0)
MCHC: 30.8 g/dL (ref 30.0–36.0)
MCV: 85.4 fL (ref 80.0–100.0)
Monocytes Absolute: 1 10*3/uL (ref 0.1–1.0)
Monocytes Relative: 9 %
Neutro Abs: 8.3 10*3/uL — ABNORMAL HIGH (ref 1.7–7.7)
Neutrophils Relative %: 76 %
Platelets: 425 10*3/uL — ABNORMAL HIGH (ref 150–400)
RBC: 3.91 MIL/uL — ABNORMAL LOW (ref 4.22–5.81)
RDW: 19.1 % — ABNORMAL HIGH (ref 11.5–15.5)
WBC: 11 10*3/uL — ABNORMAL HIGH (ref 4.0–10.5)
nRBC: 0 % (ref 0.0–0.2)

## 2021-03-21 LAB — BASIC METABOLIC PANEL
Anion gap: 7 (ref 5–15)
BUN: 31 mg/dL — ABNORMAL HIGH (ref 8–23)
CO2: 29 mmol/L (ref 22–32)
Calcium: 8.6 mg/dL — ABNORMAL LOW (ref 8.9–10.3)
Chloride: 107 mmol/L (ref 98–111)
Creatinine, Ser: 2.28 mg/dL — ABNORMAL HIGH (ref 0.61–1.24)
GFR, Estimated: 30 mL/min — ABNORMAL LOW (ref >60)
Glucose, Bld: 129 mg/dL — ABNORMAL HIGH (ref 70–99)
Potassium: 4.4 mmol/L (ref 3.5–5.1)
Sodium: 143 mmol/L (ref 135–145)

## 2021-03-21 LAB — TSH: TSH: 5.052 u[IU]/mL — ABNORMAL HIGH (ref 0.350–4.500)

## 2021-03-21 LAB — T4, FREE: Free T4: 1.05 ng/dL (ref 0.61–1.12)

## 2021-03-21 LAB — VITAMIN B12: Vitamin B-12: 347 pg/mL (ref 180–914)

## 2021-03-21 MED ORDER — VITAMIN B-12 1000 MCG PO TABS
1000.0000 ug | ORAL_TABLET | Freq: Every day | ORAL | Status: DC
  Administered 2021-03-21 – 2021-03-23 (×3): 1000 ug via ORAL
  Filled 2021-03-21 (×3): qty 1

## 2021-03-21 MED ORDER — THIAMINE HCL 100 MG PO TABS
250.0000 mg | ORAL_TABLET | Freq: Two times a day (BID) | ORAL | Status: DC
  Administered 2021-03-21 – 2021-03-23 (×4): 250 mg via ORAL
  Filled 2021-03-21 (×4): qty 3

## 2021-03-21 NOTE — Consult Note (Signed)
WOC Nurse Consult Note: Patient receiving care in [facility] 2W06 Reason for Consult: Sacral wound Wound type: Chronic stage 4 sacral  wound Pressure Injury POA: Yes Measurement:1 cm x 1 cm x 5.3 cm tunneling Wound bed: Pink Drainage (amount, consistency, odor) sanguinous Periwound: Pink scar tissue Dressing procedure/placement/frequency: Clean the sacral area with no rinse cleanser, pat dry. Apply Iodoform packing strip [place] # [phone]) into the wound loosly leaving a tale for easy removal. Secure with a 2 x 2 and foam dressing. Change daily.   Monitor the wound area(s) for worsening of condition such as: Signs/symptoms of infection, increase in size, development of or worsening of odor, development of pain, or increased pain at the affected locations.   Notify the medical team if any of these develop.  Thank you for the consult. WOC nurse will not follow at this time.   Please re-consult the WOC team if needed.  Renaldo Reel Katrinka Blazing, MSN, RN, CMSRN, Angus Seller, [facility] W[facility] Treatment Associate Pager [phone]

## 2021-03-21 NOTE — Progress Notes (Signed)
Neurology Progress Note  S: Patient states his left hip hurts, and more if he moves it. No other complaints. He told MD that his legs have been weak x one month and he has required much assistance in the SNF with activity.   Brief history: Patient presented from SNF on 03/20/21 for altered mental status and ? Facial droop. Code stroke was called CTH showed frontal encephalomalacia. 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 However, he was found to have urosepsis and treated with antibiotics.   Patient was just a [facility] 2 weeks ago for AMS. He came in on a 6 week course of antibiotics that were started in May per ID for osteomyelitis and decubitus. Diagnosed with sepsis on the 03/11/21 and stayed for 6 days. Per notes at that time, patient is mostly bed bound or wheelchair bound.   Per daughter at that time, stated patient was intermittently confused and talked sometimes and not others.   O: Current vital signs: BP 114/77   Pulse 87   Temp 97.8 F (36.6 C) (Oral)   Resp 19   Wt 85.5 kg   SpO2 96%   BMI 25.57 kg/m  Vital signs in last 24 hours: Temp:  [97.7 F (36.5 C)-97.8 F (36.6 C)] 97.8 F (36.6 C) (06/20 0346) Pulse Rate:  [63-126] 87 (06/20 0346) Resp:  [11-27] 19 (06/20 0346) BP: (101-140)/(64-88) 114/77 (06/20 0602) SpO2:  [96 %-100 %] 96 % (06/20 0346)  GENERAL: Awake, alert in NAD HEENT: Normocephalic and atraumatic. LUNGS: Normal respiratory effort.  CV: Afib on tele.  Ext: warm. His legs are bent like he is lying in the fetal position. He asked NP to straighten the left leg but he grimaces and complains of pain.    NEURO:  Mental Status: Alert and oriented to self, place, city, state, month, and year.  Speech/Language: speech is without aphasia or dysarthria.  Naming, repetition, fluency intact. He is bradyphrenic at times.   Cranial Nerves:  II: PERRL. Visual fields full.  III, IV, VI: EOMI. Eyelids  elevate symmetrically.  V: Sensation is intact to light touch and symmetrical to face.  VII: Smile is symmetrical.  IX, X: Palate elevates symmetrically. Phonation is normal.  GE:ZMOQHUTM shrug 5/5. XII: tongue is midline without fasciculations. Motor: 5/5 strength to all muscle groups tested with exception of LUE which is 4/5 grips, biceps, and triceps.  Tone: is normal in UEs, rigid in LEs.  Sensation- Intact to light touch bilaterally. Extinction absent to light touch to DSS.    Coordination: FTN intact bilaterally. DTRs:  Gait- deferred.  Medications  Current Facility-Administered Medications:    acetaminophen (TYLENOL) tablet 650 mg, 650 mg, Oral, Q6H PRN, 650 mg at 03/21/21 0953 **OR** acetaminophen (TYLENOL) suppository 650 mg, 650 mg, Rectal, Q6H PRN, MacNeil, Richard G, DO   allopurinol (ZYLOPRIM) tablet 150 mg, 150 mg, Oral, Daily, MacNeil, Richard G, DO, 150 mg at 03/21/21 5465   apixaban (ELIQUIS) tablet 5 mg, 5 mg, Oral, BID, MacNeil, Richard G, DO, 5 mg at 03/21/21 0354   ascorbic acid (VITAMIN C) tablet 500 mg, 500 mg, Oral, BID, MacNeil, Richard G, DO, 500 mg at 03/21/21 6568   atorvastatin (LIPITOR) tablet 20 mg, 20 mg, Oral, QPM, MacNeil, Richard G, DO, 20 mg at 03/20/21 1737   cefTRIAXone (ROCEPHIN) 2 g in sodium chloride 0.9 % 100 mL IVPB, 2 g, Intravenous, Daily, Laqueta Due, DO, Stopped at 03/20/21 1152  colchicine tablet 0.6 mg, 0.6 mg, Oral, Daily PRN, Arrien, Rudder Ram, MD   diltiazem (CARDIZEM) 125 mg in dextrose 5% 125 mL (1 mg/mL) infusion, 5-15 mg/hr, Intravenous, Titrated, Arrien, Apel Ram, MD, Last Rate: 5 mL/hr at 03/20/21 1912, 5 mg/hr at 03/20/21 1912   diltiazem (CARDIZEM) tablet 60 mg, 60 mg, Oral, Q8H, Arrien, Schrom Ram, MD, 60 mg at 03/21/21 0602   divalproex (DEPAKOTE SPRINKLE) capsule 250 mg, 250 mg, Oral, BID, Arrien, Pocius Ram, MD, 250 mg at 03/20/21 2231   feeding supplement (ENSURE ENLIVE / ENSURE PLUS) liquid 237  mL, 237 mL, Oral, BID BM, MacNeil, Richard G, DO   ferrous sulfate tablet 325 mg, 325 mg, Oral, BID WC, Arrien, Garciaperez Ram, MD, 325 mg at 03/20/21 1737   levalbuterol (XOPENEX) nebulizer solution 0.63 mg, 0.63 mg, Nebulization, Q6H PRN, MacNeil, Richard G, DO   midodrine (PROAMATINE) tablet 10 mg, 10 mg, Oral, TID WC, MacNeil, Richard G, DO, 10 mg at 03/20/21 1737   ondansetron (ZOFRAN) tablet 4 mg, 4 mg, Oral, Q6H PRN **OR** ondansetron (ZOFRAN) injection 4 mg, 4 mg, Intravenous, Q6H PRN, MacNeil, Richard G, DO   pantoprazole (PROTONIX) EC tablet 40 mg, 40 mg, Oral, Daily, MacNeil, Richard G, DO, 40 mg at 03/21/21 0953   polyethylene glycol (MIRALAX / GLYCOLAX) packet 17 g, 17 g, Oral, Daily, MacNeil, Richard G, DO, 17 g at 03/20/21 1032   tamsulosin (FLOMAX) capsule 0.4 mg, 0.4 mg, Oral, QPC supper, MacNeil, Richard G, DO, 0.4 mg at 03/20/21 1737   traMADol (ULTRAM) tablet 25 mg, 25 mg, Oral, BID, Arrien, Poteet Ram, MD, 25 mg at 03/21/21 0953  Pertinent Labs:  TSH high at 6.343 on 03/11/21.   Imaging:  MRI Brain  1. No acute intracranial abnormality. 2. Bifrontal encephalomalacia and findings of chronic small vessel disease.  Assessment: 69 yo who presented as a code stroke but was negative for stroke. Altered mental status was thought to be due to urosepsis. In review of his results, he has not had a new TSH, B12, B1, so we will check those to see if abnormal results could be contributing to his altered mental status. His mental status seems to have improved over his presentation and he is not aphasic. Apparently, SNF said he was placed on Depakote for seizures, but the dose is more like a mood stablization dose. Given rEEG was negative, do not see any reason to increase his dose at this time. Also, he is weaker in the legs and LUE today and we will get a Cspine Tspine MRI to r/o any type of lesion that could be causing cord compression. There is a question of cognitive decline and  this may be playing a role as well. He told NP and MD a lot of different things and was more oriented with NP.   Recommendations:  -MRI Cspine, Tspine. -TSH, free T4, Vitamin B12. Vitamin B1. With mildly elevated TSH, low normal B12. Vit B1 levels pending. -High dose Thiamine followed by 100mg  po bid if B1 low. -Continue to treat metabolic and infectious reasons for encephalopathy.  -May need official neuropsych evaluation to diagnose and treat suspected dementia or if there is a NP at the SNF, she/he could do at least a MOCA. -Get PT/OT evaluation. - Neurology inpatient team will signoff. Please feel free to contact with any questions or concerns.     NEUROHOSPITALIST ADDENDUM Performed a face to face diagnostic evaluation.   I have reviewed the contents of history and  physical exam as documented by PA/ARNP/Resident and agree with above documentation.  I have discussed and formulated the above plan as documented. Edits to the note have been made as needed.  Impression/Key exam findings/Plan:  70 y/o M with hx of Afibb on Eliquis and Seizures on VPA 250mg  BID who was BIB EMS for waxing + waning bradylalia + AMS and ?facial droop. Improved after VPA load.   Working diagnosis of metabolic encephalopathy likely in the setting of Acute renal failure and urinary tract infection.   Initial plan to increase VPA to 500mg  BID but I do think that AKI and UTI is a better plausible explanation for his encephalopathy and therefore will hold off on increasing VPA dose. Frontal lobe seizures can be very weird however so will have a low threshold. Workup with routine EEG no epileptiform abnormalities and MRI Brain with stable prior Bifrontal encephalomalacia, no acute abnormalities.  Very poor historian but today he reported that his LUE weakness and BL lower ext weakness is worse over the last month, no sensory deficit, no urinary or bowel incontinence, no saddle anesthesia, no lhermitte's sign, mildly  hyperreflexic on exam. Workup with MRI C and T spine w/o contrast with no cord compression.  Also Vit B12 levels are low from a neurological standpoint. Would recommend Cyanocobalamin PO daily.   Neurology inpatient team will signoff. Please feel free to contact with any questions or concerns.   , MD [facility] [place]   If 7pm to 7am, please call on call as listed on AMION.

## 2021-03-21 NOTE — Progress Notes (Signed)
PROGRESS NOTE    Naomi Fitton  GMW:[id number] DOB: [birth year] DOA: 03/19/2021 PCP: Hoy Register, MD    Brief Narrative:  Mr. Walgren was admitted to the hospital with the working diagnosis of metabolic encephalopathy.    70 year old male past medical history for chronic atrial fibrillation, chronic kidney disease stage III, hypertension, prediabetes, gout and sacral osteomyelitis who presented with altered mental status.  Patient is a nursing home resident, he was found to have acute changes mentation, possible facial droop that prompted him to be brought to the hospital for further evaluation.  On initial physical examination he was alert but confused, disoriented, heart rate 110 bpm, respiratory rate 27, blood pressure 87/68, 116/97, temperature 97.8.  His lungs are clear to auscultation bilaterally, heart S1-S2, irregularly irregular, abdomen soft, no lower extremity edema.   Sodium 139, potassium 4.3, chloride 104, bicarb 26, glucose 143, BUN 29, creatinine 2.12, white count 9.9, hemoglobin 10.4, hematocrit 33.8, platelets 436. SARS COVID-19 negative.   Urinalysis specific gravity 1.010, > 50 red cells,> 50 white cells.   Head/neck CT angiography with no acute intracranial changes, no emergent large vessel occlusion.  Bifrontal encephalomalacia.  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   EKG 127 bpm, normal axis, normal intervals, atrial fibrillation rhythm, no ST segment elevation or depression, low voltage.  Patient was placed on IV fluids and IV antibiotic therapy with good toleration.  He did require diltiazem infusion for rate control, atrial fibrillation.   Assessment & Plan:   Active Problems:   Persistent atrial fibrillation (HCC)   Atrial fibrillation with rapid ventricular response (HCC)   Acute renal failure superimposed on stage 3 chronic kidney disease (HCC)   Sacral osteomyelitis (HCC)   Metabolic encephalopathy    Acute metabolic encephalopathy  in the setting of urinary tract infection. (No sepsis). B12 deficiency  EEG negative for seizures.  Mentation continue to improve, he is orientated to time and place, I suspect close to his baseline.    Wbc is 11.0 and cultures continue with no significant growth.  Patient has bifrontal encephalomalacia, high risk for seizures.   Plan to continue antibiotic therapy with Ceftriaxone. Neuro checks per unit protocol. On thiamine, B12 and divalproex.   Follow with PT and OT recommendations.    2. Atrial fibrillation with rapid ventricular response. Continue atrial fibrillation, but better rate control. Plan to continue diltiazem and anticoagulation with apixaban   3. AKI renal function with serum cr at 2.28 with K at 4,4 and serum bicarbonate at 29. Patient is tolerating po well, will continue to follow up on renal function in am, avoid hypotension and nephrotoxic medications.     4. Hypotension dyslipidemia Blood pressure this am 114/77, continue to hold on antihypertensive medications.  Continue with midodrine for blood pressure    On atorvastatin   5. Chronic sacral osteomyelitis. Present on admission, Follow wound care recommendations   6. Anemia of chronic disease. Hgb and hct stable, continue with oral iron supplementation   7. Gout. No acute flare, continue with allopurinol and cholchine  Status is: Inpatient  Remains inpatient appropriate because:IV treatments appropriate due to intensity of illness or inability to take PO  Dispo: The patient is from: SNF              Anticipated d/c is to: SNF              Patient currently is not medically stable to d/c.   Difficult to place patient No   DVT  prophylaxis: Apixaban   Code Status:   DNR   Family Communication:  I was not able to reach his daughter over the phone, I left a message     Nutrition Status:           Skin Documentation: Pressure Injury Buttocks Right Unstageable - Full thickness tissue loss in which  the base of the injury is covered by slough (yellow, tan, gray, green or brown) and/or eschar (tan, brown or black) in the wound bed. (Active)     Location: Buttocks  Location Orientation: Right  Staging: Unstageable - Full thickness tissue loss in which the base of the injury is covered by slough (yellow, tan, gray, green or brown) and/or eschar (tan, brown or black) in the wound bed.  Wound Description (Comments):   Present on Admission: Yes     Consultants:  Neurology    Antimicrobials:  Ceftriaxone     Subjective: Patient continue to improve, no agitation and confusion improving, no nausea or vomiting, tolerating po well.   Objective: Vitals:   03/20/21 2000 03/20/21 2126 03/21/21 0346 03/21/21 0602  BP: 112/83 106/64 105/64 114/77  Pulse: 89 81 87   Resp: (!) 22 19 19    Temp:  97.7 F (36.5 C) 97.8 F (36.6 C)   TempSrc:  Oral Oral   SpO2: 100% 99% 96%   Weight:        Intake/Output Summary (Last 24 hours) at 03/21/2021 1633 Last data filed at 03/21/2021 0227 Gross per 24 hour  Intake 164.89 ml  Output --  Net 164.89 ml   Filed Weights   03/19/21 2100  Weight: 85.5 kg    Examination:   General: Not in pain or dyspnea. Deconditioned  Neurology: Awake and alert, non focal  E ENT: no pallor, no icterus, oral mucosa moist Cardiovascular: No JVD. S1-S2 present, rhythmic, no gallops, rubs, or murmurs. No lower extremity edema. Pulmonary: positive breath sounds bilaterally, with no wheezing, rhonchi or rales. Gastrointestinal. Abdomen soft and non tender Skin. No rashes Musculoskeletal: no joint deformities     Data Reviewed: I have personally reviewed following labs and imaging studies  CBC: Recent Labs  Lab 03/15/21 0348 03/16/21 0320 03/19/21 2115 03/19/21 2120 03/20/21 0429 03/21/21 0501  WBC 8.6 10.0 9.9  --  10.6* 11.0*  NEUTROABS 5.9 7.2 6.8  --   --  8.3*  HGB 9.9* 9.9* 10.4* 10.2* 10.6* 10.3*  HCT 31.7* 31.3* 33.8* 30.0* 33.9* 33.4*   MCV 84.3 84.4 86.9  --  84.8 85.4  PLT 262 273 436*  --  434* 425*   Basic Metabolic Panel: Recent Labs  Lab 03/15/21 0348 03/16/21 0320 03/19/21 2115 03/19/21 2120 03/20/21 0429 03/21/21 0501  NA 140 143 139 142 142 143  K 3.6 3.9 4.3 4.3 4.7 4.4  CL 106 106 104 105 108 107  CO2 26 28 26   --  26 29  GLUCOSE 105* 102* 143* 139* 109* 129*  BUN 23 22 29* 27* 30* 31*  CREATININE 1.16 1.30* 2.12* 2.10* 2.16* 2.28*  CALCIUM 8.5* 8.7* 8.5*  --  8.7* 8.6*  MG 1.9 1.9  --   --  2.1  --   PHOS  --   --   --   --  4.9*  --    GFR: Estimated Creatinine Clearance: 34 mL/min (A) (by C-G formula based on SCr of 2.28 mg/dL (H)). Liver Function Tests: Recent Labs  Lab 03/15/21 0348 03/16/21 0320 03/19/21 2115 03/20/21 0429  AST  14* 13* 11* 10*  ALT 9 9 10 8   ALKPHOS 42 41 44 44  BILITOT 0.5 0.5 0.4 0.7  PROT 5.1* 5.0* 5.4* 5.6*  ALBUMIN 2.0* 2.0* 2.0* 2.0*   No results for input(s): LIPASE, AMYLASE in the last 168 hours. No results for input(s): AMMONIA in the last 168 hours. Coagulation Profile: Recent Labs  Lab 03/19/21 2115  INR 1.8*   Cardiac Enzymes: No results for input(s): CKTOTAL, CKMB, CKMBINDEX, TROPONINI in the last 168 hours. BNP (last 3 results) No results for input(s): PROBNP in the last 8760 hours. HbA1C: No results for input(s): HGBA1C in the last 72 hours. CBG: Recent Labs  Lab 03/19/21 2112  GLUCAP 140*   Lipid Profile: No results for input(s): CHOL, HDL, LDLCALC, TRIG, CHOLHDL, LDLDIRECT in the last 72 hours. Thyroid Function Tests: Recent Labs    03/21/21 1203  TSH 5.052*  FREET4 1.05   Anemia Panel: Recent Labs    03/21/21 1203  VITAMINB12 347      Radiology Studies: I have reviewed all of the imaging during this hospital visit personally     Scheduled Meds:  allopurinol  150 mg Oral Daily   apixaban  5 mg Oral BID   vitamin C  500 mg Oral BID   atorvastatin  20 mg Oral QPM   diltiazem  60 mg Oral Q8H   divalproex  250 mg  Oral BID   feeding supplement  237 mL Oral BID BM   ferrous sulfate  325 mg Oral BID WC   midodrine  10 mg Oral TID WC   pantoprazole  40 mg Oral Daily   polyethylene glycol  17 g Oral Daily   tamsulosin  0.4 mg Oral QPC supper   traMADol  25 mg Oral BID   vitamin B-12  1,000 mcg Oral Daily   Continuous Infusions:  cefTRIAXone (ROCEPHIN)  IV 2 g (03/21/21 1027)   diltiazem (CARDIZEM) infusion 5 mg/hr (03/20/21 1912)     LOS: 1 day        Coner Gibbard 03/22/21, MD

## 2021-03-22 LAB — CBC WITH DIFFERENTIAL/PLATELET
Abs Immature Granulocytes: 0.09 10*3/uL — ABNORMAL HIGH (ref 0.00–0.07)
Basophils Absolute: 0 10*3/uL (ref 0.0–0.1)
Basophils Relative: 0 %
Eosinophils Absolute: 0.1 10*3/uL (ref 0.0–0.5)
Eosinophils Relative: 1 %
HCT: 30.3 % — ABNORMAL LOW (ref 39.0–52.0)
Hemoglobin: 9.6 g/dL — ABNORMAL LOW (ref 13.0–17.0)
Immature Granulocytes: 1 %
Lymphocytes Relative: 10 %
Lymphs Abs: 1.4 10*3/uL (ref 0.7–4.0)
MCH: 26.7 pg (ref 26.0–34.0)
MCHC: 31.7 g/dL (ref 30.0–36.0)
MCV: 84.4 fL (ref 80.0–100.0)
Monocytes Absolute: 1 10*3/uL (ref 0.1–1.0)
Monocytes Relative: 8 %
Neutro Abs: 10.8 10*3/uL — ABNORMAL HIGH (ref 1.7–7.7)
Neutrophils Relative %: 80 %
Platelets: 440 10*3/uL — ABNORMAL HIGH (ref 150–400)
RBC: 3.59 MIL/uL — ABNORMAL LOW (ref 4.22–5.81)
RDW: 18.8 % — ABNORMAL HIGH (ref 11.5–15.5)
WBC: 13.4 10*3/uL — ABNORMAL HIGH (ref 4.0–10.5)
nRBC: 0 % (ref 0.0–0.2)

## 2021-03-22 LAB — BASIC METABOLIC PANEL
Anion gap: 10 (ref 5–15)
BUN: 33 mg/dL — ABNORMAL HIGH (ref 8–23)
CO2: 28 mmol/L (ref 22–32)
Calcium: 8.6 mg/dL — ABNORMAL LOW (ref 8.9–10.3)
Chloride: 102 mmol/L (ref 98–111)
Creatinine, Ser: 2.52 mg/dL — ABNORMAL HIGH (ref 0.61–1.24)
GFR, Estimated: 27 mL/min — ABNORMAL LOW (ref >60)
Glucose, Bld: 114 mg/dL — ABNORMAL HIGH (ref 70–99)
Potassium: 4.5 mmol/L (ref 3.5–5.1)
Sodium: 140 mmol/L (ref 135–145)

## 2021-03-22 LAB — SARS CORONAVIRUS 2 (TAT 6-24 HRS): SARS Coronavirus 2: NEGATIVE

## 2021-03-22 MED ORDER — SODIUM CHLORIDE 0.45 % IV SOLN: INTRAVENOUS | Status: DC

## 2021-03-22 MED ORDER — CHLORHEXIDINE GLUCONATE CLOTH 2 % EX PADS
6.0000 | MEDICATED_PAD | Freq: Every day | CUTANEOUS | Status: DC
  Administered 2021-03-22 – 2021-03-23 (×2): 6 via TOPICAL

## 2021-03-22 NOTE — Evaluation (Signed)
Physical Therapy Evaluation Patient Details Name: Jeffery Christian MRN: [id number] DOB: [birth year] Today's Date: 03/22/2021   History of Present Illness  70 y.o. male LTC resident from [facility] presenting to ED 6/18 with AMS. Patient admitted with acute metabolic encephalopathy secondary to UTI without sepsis and AKI. Recent hospital admission several days ago with similar symptoms. PMHx significant for chronic A-fib, CKD III, HTN, prediabetes, and chronic sacral osteomyelitis.  Clinical Impression  PTA pt resident of [facility] Pt poor historian and unable to give accurate PLOF without providing leading questions. Pt reports he spends most of his time in bed and requires assist for all ADLs. Pt is currently severely limited in safe mobility by fear of falling, in presence of decreased strength and balance and bilateral foot and ankle pain secondary to gout. Pt is currently total A for bed mobility, with marked retropulsion with coming to EoB. Attempted sit>stand however pt only able to clear hips enough to remove soiled bed linens with total Ax2. PT recommending return to SNF at discharge. PT will continue to follow acutely.     Follow Up Recommendations SNF    Equipment Recommendations  None recommended by PT    Recommendations for Other Services OT consult     Precautions / Restrictions Precautions Precautions: Fall Precaution Comments: monitor HR Restrictions Weight Bearing Restrictions: No      Mobility  Bed Mobility Overal bed mobility: Needs Assistance Bed Mobility: Supine to Sit;Sit to Supine     Supine to sit: +2 for physical assistance;Total assist Sit to supine: +2 for physical assistance;Total assist   General bed mobility comments: total Ax2 for all bed mobility, retropulsion with trunk in attempt to come to EOB    Transfers Overall transfer level: Needs assistance Equipment used: 2 person hand held assist Transfers: Sit to/from Stand Sit to Stand: Total  assist;+2 physical assistance;From elevated surface         General transfer comment: placed recliner back in front of pt to provide place for pt to pull up, pt requires total Ax2 to lift hips off of bed to remove soiled linens  Ambulation/Gait             General Gait Details: unable        Balance Overall balance assessment: Needs assistance Sitting-balance support: Feet supported;Bilateral upper extremity supported Sitting balance-Leahy Scale: Poor Sitting balance - Comments: pt with retropulsive posterior lean which increases with efforts to bring trunk forward, pt endorses fear of falling Postural control: Posterior lean   Standing balance-Leahy Scale: Zero                               Pertinent Vitals/Pain Pain Assessment: Faces Faces Pain Scale: Hurts even more Pain Location: bilateral feet from gout Pain Descriptors / Indicators: Grimacing;Guarding Pain Intervention(s): Limited activity within patient's tolerance;Monitored during session;Repositioned    Home Living Family/patient expects to be discharged to:: Skilled nursing facility                 Additional Comments: pt very poor historian providing information about living home alone. When asked about [facility], agrees that he was living there    Prior Function Level of Independence: Needs assistance   Gait / Transfers Assistance Needed: pt denies w/c use, states he spends most of his time in bed  ADL's / Homemaking Assistance Needed: pt able to feed himself with set up, requires total A for all other ADLs  Extremity/Trunk Assessment   Upper Extremity Assessment Upper Extremity Assessment: Defer to OT evaluation RUE Deficits / Details: Limited AROM at shoulder. WFL at elbow, wrist and digits. MMT grossly 3+/5. RUE Sensation: WNL RUE Coordination: decreased fine motor;decreased gross motor LUE Deficits / Details: Limited AROM throughout. Shoulder abduction with greater  ROM than with shoulder flexion. MMT grossly 2-/5 throughout including gross grasp. LUE: Unable to fully assess due to pain LUE Sensation: WNL LUE Coordination: decreased fine motor;decreased gross motor    Lower Extremity Assessment Lower Extremity Assessment: RLE deficits/detail;LLE deficits/detail RLE Deficits / Details: full hip and knee ROM limited by tightness, strength grossly 2+/5, ankle and toes too painful to assess RLE: Unable to fully assess due to pain RLE Sensation: WNL RLE Coordination: decreased gross motor LLE Deficits / Details: full hip and knee ROM limited by tightness, strength grossly 2+/5, ankle and toes too painful to assess LLE: Unable to fully assess due to pain LLE Coordination: decreased gross motor    Cervical / Trunk Assessment Cervical / Trunk Assessment: Kyphotic  Communication   Communication: Expressive difficulties (limited verbalization)  Cognition Arousal/Alertness: Awake/alert Behavior During Therapy: Flat affect Overall Cognitive Status: No family/caregiver present to determine baseline cognitive functioning Area of Impairment: Orientation;Attention;Memory;Following commands;Safety/judgement;Awareness;Problem solving                 Orientation Level: Disoriented to;Time;Situation Current Attention Level: Focused Memory: Decreased short-term memory Following Commands: Follows one step commands inconsistently;Follows one step commands with increased time Safety/Judgement: Decreased awareness of deficits;Decreased awareness of safety Awareness: Intellectual Problem Solving: Slow processing;Decreased initiation;Difficulty sequencing;Requires verbal cues General Comments: Patient oriented to person and place but disoriented to time and situation. Requries incresed time to process verbal information. Very limited vocalizations. Unable/?unwilling? to hold causal conversation with this Clinical research associate.      General Comments General comments (skin  integrity, edema, etc.): VSS on RA        Assessment/Plan    PT Assessment Patient needs continued PT services  PT Problem List Decreased strength;Decreased range of motion;Decreased activity tolerance;Decreased balance;Decreased mobility;Decreased coordination;Decreased cognition;Decreased knowledge of use of DME;Decreased safety awareness;Decreased knowledge of precautions;Cardiopulmonary status limiting activity;Pain       PT Treatment Interventions DME instruction;Gait training;Functional mobility training;Therapeutic activities;Therapeutic exercise;Balance training;Neuromuscular re-education;Cognitive remediation;Patient/family education    PT Goals (Current goals can be found in the Care Plan section)  Acute Rehab PT Goals Patient Stated Goal: none stated PT Goal Formulation: With patient Time For Goal Achievement: 04/05/21 Potential to Achieve Goals: Fair    Frequency Min 2X/week        Co-evaluation PT/OT/SLP Co-Evaluation/Treatment: Yes Reason for Co-Treatment: Complexity of the patient's impairments (multi-system involvement);For patient/therapist safety PT goals addressed during session: Mobility/safety with mobility OT goals addressed during session: ADL's and self-care       AM-PAC PT "6 Clicks" Mobility  Outcome Measure Help needed turning from your back to your side while in a flat bed without using bedrails?: Total Help needed moving from lying on your back to sitting on the side of a flat bed without using bedrails?: Total Help needed moving to and from a bed to a chair (including a wheelchair)?: Total Help needed standing up from a chair using your arms (e.g., wheelchair or bedside chair)?: Total Help needed to walk in hospital room?: Total Help needed climbing 3-5 steps with a railing? : Total 6 Click Score: 6    End of Session Equipment Utilized During Treatment: Gait belt Activity Tolerance: Patient limited by pain;Other (comment) (pt  limited by fear  of falling) Patient left: in bed;with call bell/phone within reach;with bed alarm set Nurse Communication: Mobility status PT Visit Diagnosis: Other abnormalities of gait and mobility (R26.89)    Time: 0175-1025 PT Time Calculation (min) (ACUTE ONLY): 24 min   Charges:   PT Evaluation $PT Eval Moderate Complexity: 1 Mod          Dez Stauffer B. Beverely Risen PT, DPT Acute Rehabilitation Services Pager [phone] Office [phone]   Elon Alas [facility] 03/22/2021, 12:33 PM

## 2021-03-22 NOTE — Evaluation (Signed)
Occupational Therapy Evaluation Patient Details Name: Jeffery Christian MRN: [id number] DOB: 1950-11-26 Today's Date: 03/22/2021    History of Present Illness 70 y.o. male LTC resident from [facility] presenting to ED 6/18 with AMS. Patient admitted with acute metabolic encephalopathy secondary to UTI without sepsis and AKI. Recent hospital admission several days ago with similar symptoms. PMHx significant for chronic A-fib, CKD III, HTN, prediabetes, and chronic sacral osteomyelitis.   Clinical Impression   Patient is a poor historian with PLOF obtained from med chart. At baseline patient is mostly bedbound and requires assist for all ADLs with the exception of self-feeding and grooming. Patient currently functioning near presumed baseline requiring Total A +2 for all parts of bed mobility secondary to strong posterior pushing. Patient also limited by deficits listed below including generalized weakness, decreased sitting balance and decreased cognition and would benefit from continued acute OT services in prep for safe d/c back to SNF.     Follow Up Recommendations  SNF;Supervision/Assistance - 24 hour    Equipment Recommendations  None recommended by OT    Recommendations for Other Services       Precautions / Restrictions Precautions Precautions: Fall Precaution Comments: monitor HR Restrictions Weight Bearing Restrictions: No      Mobility Bed Mobility Overal bed mobility: Needs Assistance Bed Mobility: Supine to Sit;Sit to Supine     Supine to sit: +2 for physical assistance;Total assist Sit to supine: +2 for physical assistance;Total assist   General bed mobility comments: total Ax2 for all bed mobility, retropulsion with trunk in attempt to come to EOB    Transfers Overall transfer level: Needs assistance Equipment used: 2 person hand held assist Transfers: Sit to/from Stand Sit to Stand: Total assist;+2 physical assistance;From elevated surface         General  transfer comment: placed recliner back in front of pt to provide place for pt to pull up, pt requires total Ax2 to lift hips off of bed to remove soiled linens    Balance Overall balance assessment: Needs assistance Sitting-balance support: Feet supported;Bilateral upper extremity supported Sitting balance-Leahy Scale: Poor Sitting balance - Comments: pt with retropulsive posterior lean which increases with efforts to bring trunk forward, pt endorses fear of falling Postural control: Posterior lean   Standing balance-Leahy Scale: Zero                             ADL either performed or assessed with clinical judgement   ADL Overall ADL's : Needs assistance/impaired     Grooming: Wash/dry face;Set up;Bed level Grooming Details (indicate cue type and reason): Able to wash face when offered washcloth.             Lower Body Dressing: Total assistance;Bed level Lower Body Dressing Details (indicate cue type and reason): Unable to don socks secondary to swelling/pain in bilateral feet/legs.               General ADL Comments: Patient likely not far from presumed baseline. Requires Total A grossly for ADLs with exception of grooming and self-feeding.     Vision Patient Visual Report: No change from baseline       Perception     Praxis      Pertinent Vitals/Pain Pain Assessment: Faces Faces Pain Scale: Hurts even more Pain Location: bilateral feet from gout Pain Descriptors / Indicators: Grimacing;Guarding Pain Intervention(s): Limited activity within patient's tolerance;Monitored during session;Repositioned     Hand Dominance  Extremity/Trunk Assessment Upper Extremity Assessment Upper Extremity Assessment: RUE deficits/detail;LUE deficits/detail RUE Deficits / Details: Limited AROM at shoulder. WFL at elbow, wrist and digits. MMT grossly 3+/5. RUE Sensation: WNL RUE Coordination: decreased fine motor;decreased gross motor LUE Deficits / Details:  Limited AROM throughout. Shoulder abduction with greater ROM than with shoulder flexion. MMT grossly 2-/5 throughout including gross grasp. LUE: Unable to fully assess due to pain LUE Sensation: WNL LUE Coordination: decreased fine motor;decreased gross motor   Lower Extremity Assessment Lower Extremity Assessment: RLE deficits/detail;LLE deficits/detail RLE Deficits / Details: full hip and knee ROM limited by tightness, strength grossly 2+/5, ankle and toes too painful to assess RLE: Unable to fully assess due to pain RLE Sensation: WNL RLE Coordination: decreased gross motor LLE Deficits / Details: full hip and knee ROM limited by tightness, strength grossly 2+/5, ankle and toes too painful to assess LLE: Unable to fully assess due to pain LLE Coordination: decreased gross motor   Cervical / Trunk Assessment Cervical / Trunk Assessment: Kyphotic   Communication Communication Communication: Expressive difficulties;Other (comment) (Limited verbalizations)   Cognition Arousal/Alertness: Awake/alert Behavior During Therapy: Flat affect Overall Cognitive Status: No family/caregiver present to determine baseline cognitive functioning Area of Impairment: Orientation;Attention;Memory;Following commands;Safety/judgement;Awareness;Problem solving                 Orientation Level: Disoriented to;Time;Situation Current Attention Level: Focused Memory: Decreased short-term memory Following Commands: Follows one step commands inconsistently;Follows one step commands with increased time Safety/Judgement: Decreased awareness of deficits;Decreased awareness of safety Awareness: Intellectual Problem Solving: Slow processing;Decreased initiation;Difficulty sequencing;Requires verbal cues General Comments: Patient oriented to person and place but disoriented to time and situation. Requries incresed time to process verbal information. Very limited vocalizations. Unable/?unwilling? to hold  causal conversation with this Clinical research associate.   General Comments  VSS on RA    Exercises     Shoulder Instructions      Home Living Family/patient expects to be discharged to:: Skilled nursing facility                                 Additional Comments: pt very poor historian providing information about living home alone. When asked about [facility], agrees that he was living there.      Prior Functioning/Environment Level of Independence: Needs assistance  Gait / Transfers Assistance Needed: pt denies w/c use, states he spends most of his time in bed ADL's / Homemaking Assistance Needed: pt able to feed himself with set up, requires total A for all other ADLs at bedlevel            OT Problem List: Decreased strength;Decreased range of motion;Decreased activity tolerance;Impaired balance (sitting and/or standing);Decreased knowledge of use of DME or AE;Decreased knowledge of precautions      OT Treatment/Interventions: Self-care/ADL training;Therapeutic exercise;DME and/or AE instruction;Therapeutic activities;Patient/family education    OT Goals(Current goals can be found in the care plan section) Acute Rehab OT Goals Patient Stated Goal: No goals stated. OT Goal Formulation: With patient Time For Goal Achievement: 04/05/21 Potential to Achieve Goals: Fair ADL Goals Pt Will Perform Upper Body Bathing: with mod assist;bed level Pt Will Perform Upper Body Dressing: with mod assist;bed level;sitting Pt/caregiver will Perform Home Exercise Program: Increased ROM;Increased strength;Both right and left upper extremity;With minimal assist Additional ADL Goal #1: Patient will perform bed mobility with Mod A in prep for ADLs. Additional ADL Goal #2: Patient will tolerate sitting at EOB for 5-8  min with Min guard in prep for ADLs.  OT Frequency: Min 2X/week   Barriers to D/C:            Co-evaluation   Reason for Co-Treatment: Complexity of the patient's impairments  (multi-system involvement);For patient/therapist safety PT goals addressed during session: Mobility/safety with mobility OT goals addressed during session: ADL's and self-care      AM-PAC OT "6 Clicks" Daily Activity     Outcome Measure Help from another person eating meals?: A Little Help from another person taking care of personal grooming?: A Little Help from another person toileting, which includes using toliet, bedpan, or urinal?: Total Help from another person bathing (including washing, rinsing, drying)?: Total Help from another person to put on and taking off regular upper body clothing?: A Lot Help from another person to put on and taking off regular lower body clothing?: Total 6 Click Score: 11   End of Session Equipment Utilized During Treatment: Gait belt Nurse Communication: Mobility status  Activity Tolerance: Patient tolerated treatment well Patient left: in bed;with call bell/phone within reach;with bed alarm set  OT Visit Diagnosis: Unsteadiness on feet (R26.81);Other abnormalities of gait and mobility (R26.89);Muscle weakness (generalized) (M62.81)                Time: 5993-5701 OT Time Calculation (min): 27 min Charges:  OT General Charges $OT Visit: 1 Visit OT Evaluation $OT Eval Moderate Complexity: 1 Mod  Lonnie Rosado H. OTR/L Supplemental OT, Department of rehab services [phone]  Bulmaro Feagans R H. 03/22/2021, 11:10 AM

## 2021-03-22 NOTE — Consult Note (Signed)
   [facility] Inpatient Consult   03/22/2021  Jeffery Christian June 02, 1951 [id number]  [facility] [[facility]]  Accountable Care Organization [ACO] Patient: Medicare CMS DCE  Primary Care Provider:  Hoy Register, MD at [facility] clinic  Patient screened for less than 7 days re-hospitalization with noted extreme high risk score for unplanned readmission .  Review of patient's medical record reveals patient is was recently transitioned to a skilled nursing facility at [facility] and transitioned back to [facility] on 03/19/21 for possible Code Stroke. Review of [facility] RN notes in Electronic Medical record [12/21/20 and 01/11/21] is that the family is pursuing long term care SNF and applying for Medicaid noted.  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  This Clinical research associate will sign off is this is long term resident patient at transition from the hospital. Will follow til transition to make sure of no community needs.   For questions contact:   Charlesetta Shanks, RN BSN CCM [facility]  [phone] business mobile phone Toll free office [phone]  Fax number: [phone] [email or web address]

## 2021-03-22 NOTE — TOC Initial Note (Signed)
Transition of Care [facility]) - Initial/Assessment Note    Patient Details  Name: Jeffery Christian MRN: [id number] Date of Birth: 11-13-1950  Transition of Care [facility]) CM/SW Contact:    Joanne Chars, LCSW Phone Number: 03/22/2021, 2:21 PM  Clinical Narrative: CSW met with pt regarding discharge recommendation for SNF.  Pt confirms he has been staying at [facility], reports his goal is to return home to his house on [address], but is agreeable to return to [place] for rehab now.  Permission given to speak with daughter [place] and brother Elta [place].  Pt reports he is not vaccinated for covid.    CSW spoke with daughter [place] who confirms that pt has been long term care resident at [facility].  They are waiting for medicaid approval.  She is in agreement with pt returning there as rehab pt initially.  Discussed possible DC tomorrow.  CSW also spoke with pt brother Elta Guadel[place] and updated him as well.  CSW spoke with Perrin Smack at [facility] who confirmed that pt can return to rehab tomorrow.                    Expected Discharge Plan: Skilled Nursing Facility Barriers to Discharge: Continued Medical Work up   Patient Goals and CMS Choice Patient states their goals for this hospitalization and ongoing recovery are:: "move back home" CMS Medicare.gov Compare Post Acute Care list provided to::  (Pt agreeable to return to [facility])    Expected Discharge Plan and Services Expected Discharge Plan: [place] Choice: [place] Living arrangements for the past 2 months: [place]                                      Prior Living Arrangements/Services Living arrangements for the past 2 months: [place] Lives with:: Facility Resident Patient language and need for interpreter reviewed:: Yes Do you feel safe going back to the place where you live?: Yes      Need for Family Participation in Patient Care: Yes  (Comment) Care giver support system in place?: Yes (comment) Current home services: Other (comment) (na) Criminal Activity/Legal Involvement Pertinent to Current Situation/Hospitalization: No - Comment as needed  Activities of Daily Living Home Assistive Devices/Equipment: Wheelchair, Hospital bed ADL Screening (condition at time of admission) Patient's cognitive ability adequate to safely complete daily activities?: Yes Is the patient deaf or have difficulty hearing?: No Does the patient have difficulty seeing, even when wearing glasses/contacts?: No Does the patient have difficulty concentrating, remembering, or making decisions?: Yes Patient able to express need for assistance with ADLs?: Yes Does the patient have difficulty dressing or bathing?: Yes Independently performs ADLs?: No Communication: Independent Dressing (OT): Dependent Is this a change from baseline?: Pre-admission baseline Grooming: Dependent Is this a change from baseline?: Pre-admission baseline Feeding: Needs assistance Is this a change from baseline?: Pre-admission baseline Bathing: Dependent Is this a change from baseline?: Pre-admission baseline Toileting: Needs assistance Is this a change from baseline?: Pre-admission baseline In/Out Bed: Dependent Is this a change from baseline?: Pre-admission baseline Walks in Home: Dependent Is this a change from baseline?: Pre-admission baseline Does the patient have difficulty walking or climbing stairs?: Yes Weakness of Legs: Both Weakness of Arms/Hands: Both  Permission Sought/Granted Permission sought to share information with : Family Supports Permission granted to share information with : Yes, Verbal Permission  Granted  Share Information with NAME: daughter [place], brother Elta [place]  Permission granted to share info w AGENCY: [facility]        Emotional Assessment Appearance:: Appears stated age Attitude/Demeanor/Rapport: Engaged Affect (typically observed):  Appropriate Orientation: : Oriented to Self, Oriented to Place Alcohol / Substance Use: Not Applicable Psych Involvement: No (comment)  Admission diagnosis:  Metabolic encephalopathy [H21.78] Altered mental status, unspecified altered mental status type [R41.82] Patient Active Problem List   Diagnosis Date Noted   Metabolic encephalopathy 37/54/2370   Sepsis ([place]) 03/11/2021   Prediabetes 03/11/2021   Asymmetric edema of both lower extremities 02/22/2021   Decubitus ulcer of buttock 01/26/2021   Sacral osteomyelitis ([place]) 01/26/2021   Normochromic normocytic anemia 12/14/2020   Urinary retention 11/18/2020   Neurocognitive deficits 11/18/2020   Acute renal failure superimposed on stage 3 chronic kidney disease ([place]) 23/10/7207   Acute metabolic encephalopathy 10/68/1661   Hypernatremia 11/09/2020   Elevated troponin 11/09/2020   COVID-19 virus infection 11/09/2020   Hypertension 01/07/2018   Atrial fibrillation with rapid ventricular response ([place]) 08/05/2017   CKD (chronic kidney disease), stage III ([place]) 08/05/2017   Persistent atrial fibrillation (HCC)    Chronic systolic CHF (congestive heart failure) ([place])    Dilated cardiomyopathy ([place])    Shortness of breath    Gout 07/18/2017   Left buttock abscess 03/22/2014   PCP:  Charlott Rakes, MD Pharmacy:   [facility] and [place] [address] [place] [address] Phone: [phone] Fax: [phone]  [id number] [place]Phone: [phone]: [phone] [place]Phone: [phone]: [phone]    Social Determinants of Health (SDOH) Interventions    Readmission Risk Interventions No flowsheet data found.

## 2021-03-22 NOTE — Progress Notes (Addendum)
PROGRESS NOTE    Franki Stemen  GNF:[id number] DOB: 05/02/1951 DOA: 03/19/2021 PCP: Hoy Register, MD    Brief Narrative:  Mr. Slovacek was admitted to the hospital with the working diagnosis of metabolic encephalopathy.    70 year old male past medical history for chronic atrial fibrillation, chronic kidney disease stage III, hypertension, prediabetes, gout and sacral osteomyelitis who presented with altered mental status.  Patient is a nursing home resident, he was found to have acute changes mentation, possible facial droop that prompted him to be brought to the hospital for further evaluation.  On initial physical examination he was alert but confused, disoriented, heart rate 110 bpm, respiratory rate 27, blood pressure 87/68, 116/97, temperature 97.8.  His lungs are clear to auscultation bilaterally, heart S1-S2, irregularly irregular, abdomen soft, no lower extremity edema.   Sodium 139, potassium 4.3, chloride 104, bicarb 26, glucose 143, BUN 29, creatinine 2.12, white count 9.9, hemoglobin 10.4, hematocrit 33.8, platelets 436. SARS COVID-19 negative.   Urinalysis specific gravity 1.010, > 50 red cells,> 50 white cells.   Head/neck CT angiography with no acute intracranial changes, no emergent large vessel occlusion.  Bifrontal encephalomalacia.  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   EKG 127 bpm, normal axis, normal intervals, atrial fibrillation rhythm, no ST segment elevation or depression, low voltage.   Patient was placed on IV fluids and IV antibiotic therapy with good toleration. He did require diltiazem infusion for rate control, atrial fibrillation.    His mentation continue to improve, close to baseline.   Plan to return to SNF on 03/23/21 if continue to improve.    Assessment & Plan:   Active Problems:   Persistent atrial fibrillation (HCC)   Atrial fibrillation with rapid ventricular response (HCC)   Acute renal failure superimposed on stage 3  chronic kidney disease (HCC)   Sacral osteomyelitis (HCC)   Metabolic encephalopathy       Acute metabolic encephalopathy in the setting of urinary tract infection. (No sepsis). B12 deficiency EEG negative for seizures. Patient getting close to his baseline.  Patient has bifrontal encephalomalacia, high risk for seizures.   Wbc continue to be elevated at 13.4  On IV Ceftriaxone, thiamine, B12 and divalproex with good toleration. Will plan for 3 days of antibiotic therapy.  Old records personally reviewed, renal US from 11/2020 with no hydronephrosis or obstructive uropathy.   If continue to improve plan is to return to SNF tomorrow.    2. Atrial fibrillation with rapid ventricular response. rate has remained controlled with oral diltiazem. Continue anticoagulation with apixaban.    3. AKI on CKD stage 2. (Base cr 1,2) worsening renal function with serum cr up to 2,52 with K at 4,5 and serum bicarbonate at 28. Cl is 102 and Na 140.  Add gentle hydration with 0,45% saline and follow up renal function in am. Avoid hypotension and nephrotoxic medications.    4. Hypotension/  dyslipidemia stable blood pressure 111 to 114 mmHg systolic. Continue with midodrine.    Continue with atorvastatin   5. Chronic sacral osteomyelitis. Present on admission. Wound care   6. Anemia of chronic disease/  iron deficiency anemia. hgb 9,6 and Hct at 30,3 Continue with oral iron supplementation.    7. Gout. No signs of acute flare, on allopurinol and cholchine.   Status is: Inpatient  Remains inpatient appropriate because:Inpatient level of care appropriate due to severity of illness  Dispo: The patient is from: SNF  Anticipated d/c is to: SNF              Patient currently is not medically stable to d/c. Plan for dc back to SNF tomorrow.    Difficult to place patient No    DVT prophylaxis: Enoxaparin   Code Status:   DNR   Family Communication:  I spoke over the phone with  the patient's daughtrer about patient's  condition, plan of care, prognosis and all questions were addressed.          Skin Documentation: Pressure Injury Buttocks Right Unstageable - Full thickness tissue loss in which the base of the injury is covered by slough (yellow, tan, gray, green or brown) and/or eschar (tan, brown or black) in the wound bed. (Active)     Location: Buttocks  Location Orientation: Right  Staging: Unstageable - Full thickness tissue loss in which the base of the injury is covered by slough (yellow, tan, gray, green or brown) and/or eschar (tan, brown or black) in the wound bed.  Wound Description (Comments):   Present on Admission: Yes     Consultants:  Neurology    Antimicrobials:  Ceftriaxone     Subjective: Patient is feeling better, no nausea or vomiting, tolerating po well.   Objective: Vitals:   03/22/21 0606 03/22/21 0900 03/22/21 1348 03/22/21 1412  BP: 111/78 121/77 (!) 114/91 111/80  Pulse: 88 95 (!) 102 98  Resp: 16 16 16 16   Temp: 97.6 F (36.4 C) 98 F (36.7 C) 98.3 F (36.8 C)   TempSrc: Oral Oral Oral   SpO2: 96% 96% 96%   Weight:        Intake/Output Summary (Last 24 hours) at 03/22/2021 1450 Last data filed at 03/21/2021 1947 Gross per 24 hour  Intake 100 ml  Output 500 ml  Net -400 ml   Filed Weights   03/19/21 2100  Weight: 85.5 kg    Examination:   General: Not in pain or dyspnea., deconditioned  Neurology: Awake and alert, mild confusion but not agitation. Follows commands and answers simple questions.  E ENT: no pallor, no icterus, oral mucosa moist Cardiovascular: No JVD. S1-S2 present, rhythmic, no gallops, rubs, or murmurs. No lower extremity edema. Pulmonary:  positive breath sounds bilaterally, adequate air movement, no wheezing, rhonchi or rales. Gastrointestinal. Abdomen soft and non tender Skin. No rashes Musculoskeletal: no joint deformities     Data Reviewed: I have personally reviewed following  labs and imaging studies  CBC: Recent Labs  Lab 03/16/21 0320 03/19/21 2115 03/19/21 2120 03/20/21 0429 03/21/21 0501 03/22/21 0445  WBC 10.0 9.9  --  10.6* 11.0* 13.4*  NEUTROABS 7.2 6.8  --   --  8.3* 10.8*  HGB 9.9* 10.4* 10.2* 10.6* 10.3* 9.6*  HCT 31.3* 33.8* 30.0* 33.9* 33.4* 30.3*  MCV 84.4 86.9  --  84.8 85.4 84.4  PLT 273 436*  --  434* 425* 440*   Basic Metabolic Panel: Recent Labs  Lab 03/16/21 0320 03/19/21 2115 03/19/21 2120 03/20/21 0429 03/21/21 0501 03/22/21 0445  NA 143 139 142 142 143 140  K 3.9 4.3 4.3 4.7 4.4 4.5  CL 106 104 105 108 107 102  CO2 28 26  --  26 29 28   GLUCOSE 102* 143* 139* 109* 129* 114*  BUN 22 29* 27* 30* 31* 33*  CREATININE 1.30* 2.12* 2.10* 2.16* 2.28* 2.52*  CALCIUM 8.7* 8.5*  --  8.7* 8.6* 8.6*  MG 1.9  --   --  2.1  --   --  PHOS  --   --   --  4.9*  --   --    GFR: Estimated Creatinine Clearance: 30.8 mL/min (A) (by C-G formula based on SCr of 2.52 mg/dL (H)). Liver Function Tests: Recent Labs  Lab 03/16/21 0320 03/19/21 2115 03/20/21 0429  AST 13* 11* 10*  ALT 9 10 8   ALKPHOS 41 44 44  BILITOT 0.5 0.4 0.7  PROT 5.0* 5.4* 5.6*  ALBUMIN 2.0* 2.0* 2.0*   No results for input(s): LIPASE, AMYLASE in the last 168 hours. No results for input(s): AMMONIA in the last 168 hours. Coagulation Profile: Recent Labs  Lab 03/19/21 2115  INR 1.8*   Cardiac Enzymes: No results for input(s): CKTOTAL, CKMB, CKMBINDEX, TROPONINI in the last 168 hours. BNP (last 3 results) No results for input(s): PROBNP in the last 8760 hours. HbA1C: No results for input(s): HGBA1C in the last 72 hours. CBG: Recent Labs  Lab 03/19/21 2112  GLUCAP 140*   Lipid Profile: No results for input(s): CHOL, HDL, LDLCALC, TRIG, CHOLHDL, LDLDIRECT in the last 72 hours. Thyroid Function Tests: Recent Labs    03/21/21 1203  TSH 5.052*  FREET4 1.05   Anemia Panel: Recent Labs    03/21/21 1203  VITAMINB12 347      Radiology  Studies: I have reviewed all of the imaging during this hospital visit personally     Scheduled Meds:  allopurinol  150 mg Oral Daily   apixaban  5 mg Oral BID   vitamin C  500 mg Oral BID   atorvastatin  20 mg Oral QPM   Chlorhexidine Gluconate Cloth  6 each Topical Q0600   diltiazem  60 mg Oral Q8H   divalproex  250 mg Oral BID   feeding supplement  237 mL Oral BID BM   ferrous sulfate  325 mg Oral BID WC   midodrine  10 mg Oral TID WC   pantoprazole  40 mg Oral Daily   polyethylene glycol  17 g Oral Daily   tamsulosin  0.4 mg Oral QPC supper   thiamine  250 mg Oral BID   traMADol  25 mg Oral BID   vitamin B-12  1,000 mcg Oral Daily   Continuous Infusions:  sodium chloride 75 mL/hr at 03/22/21 1142   cefTRIAXone (ROCEPHIN)  IV 2 g (03/22/21 0924)     LOS: 2 days        Westyn Driggers 03/24/21, MD

## 2021-03-22 NOTE — NC FL2 (Signed)
[place] MEDICAID FL2 LEVEL OF CARE SCREENING TOOL     IDENTIFICATION  Patient Name: Jeffery Christian Birthdate: [birth year] Sex: male Admission Date (Current Location): 03/19/2021  [facility] and [place] Number:  Producer, television/film/video and Address:  [facility] [place], [address] [place], [place] [address]      Provider Number: [id number]  Attending Physician Name and Address:  Coralie Keens,*  Relative Name and Phone Number:  [place], daughter, [phone]    Current Level of Care: Hospital Recommended Level of Care: Skilled Nursing Facility Prior Approval Number:    Date Approved/Denied:   PASRR Number: [id number]  Discharge Plan: SNF    Current Diagnoses: Patient Active Problem List   Diagnosis Date Noted   Metabolic encephalopathy 03/19/2021   Sepsis (HCC) 03/11/2021   Prediabetes 03/11/2021   Asymmetric edema of both lower extremities 02/22/2021   Decubitus ulcer of buttock 01/26/2021   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   Normochromic normocytic anemia 12/14/2020   Urinary retention 11/18/2020   Neurocognitive deficits 11/18/2020   Acute renal failure superimposed on stage 3 chronic kidney disease (HCC) 11/09/2020   Acute metabolic encephalopathy 11/09/2020   Hypernatremia 11/09/2020   Elevated troponin 11/09/2020   COVID-19 virus infection 11/09/2020   Hypertension 01/07/2018   Atrial fibrillation with rapid ventricular response (HCC) 08/05/2017   CKD (chronic kidney disease), stage III (HCC) 08/05/2017   Persistent atrial fibrillation (HCC)    Chronic systolic CHF (congestive heart failure) (HCC)    Dilated cardiomyopathy (HCC)    Shortness of breath    Gout 07/18/2017   Left buttock abscess 03/22/2014    Orientation RESPIRATION BLADDER Height & Weight     Self, Situation, Place  Normal Incontinent Weight: 188 lb 8 oz (85.5 kg) Height:     BEHAVIORAL SYMPTOMS/MOOD NEUROLOGICAL BOWEL NUTRITION STATUS      Incontinent Diet  (see discharge summary)  AMBULATORY STATUS COMMUNICATION OF NEEDS Skin   (P) Total Care Verbally Other (Comment) (sacral wound)                       Personal Care Assistance Level of Assistance    Bathing Assistance: Maximum assistance Feeding assistance: Limited assistance Dressing Assistance: Maximum assistance     Functional Limitations Info  Sight, Hearing, Speech Sight Info: Adequate Hearing Info: Adequate Speech Info: Adequate    SPECIAL CARE FACTORS FREQUENCY  PT (By licensed PT), OT (By licensed OT)     PT Frequency: 5x week OT Frequency: 5x week            Contractures Contractures Info: Not present    Additional Factors Info  Code Status Code Status Info: DNR Allergies Info: NKA Psychotropic Info: Depakote         Current Medications (03/22/2021):  This is the current hospital active medication list Current Facility-Administered Medications  Medication Dose Route Frequency Provider Last Rate Last Admin   0.45 % sodium chloride infusion   Intravenous Continuous Arrien, Joens Ram, MD 75 mL/hr at 03/22/21 1142 New Bag at 03/22/21 1142   acetaminophen (TYLENOL) tablet 650 mg  650 mg Oral Q6H PRN Laqueta Due, DO   650 mg at 03/21/21 8469   Or   acetaminophen (TYLENOL) suppository 650 mg  650 mg Rectal Q6H PRN Laqueta Due, DO       allopurinol (ZYLOPRIM) tablet 150 mg  150 mg Oral Daily Laqueta Due, DO   150 mg at 03/22/21 [phone]  apixaban (ELIQUIS) tablet 5 mg  5 mg Oral BID Laqueta Due, DO   5 mg at 03/22/21 3976   ascorbic acid (VITAMIN C) tablet 500 mg  500 mg Oral BID Laqueta Due, DO   500 mg at 03/22/21 7341   atorvastatin (LIPITOR) tablet 20 mg  20 mg Oral QPM Laqueta Due, DO   20 mg at 03/21/21 1758   cefTRIAXone (ROCEPHIN) 2 g in sodium chloride 0.9 % 100 mL IVPB  2 g Intravenous Daily Laqueta Due, DO 200 mL/hr at 03/22/21 9379 2 g at 03/22/21 0240   Chlorhexidine Gluconate Cloth 2 % PADS 6  each  6 each Topical Q0600 Coralie Keens, MD   6 each at 03/22/21 1147   colchicine tablet 0.6 mg  0.6 mg Oral Daily PRN Arrien, Tokar Ram, MD       diltiazem (CARDIZEM) tablet 60 mg  60 mg Oral Q8H Arrien, Parsell Ram, MD   60 mg at 03/22/21 1348   divalproex (DEPAKOTE SPRINKLE) capsule 250 mg  250 mg Oral BID Coralie Keens, MD   250 mg at 03/22/21 0919   feeding supplement (ENSURE ENLIVE / ENSURE PLUS) liquid 237 mL  237 mL Oral BID BM Laqueta Due, DO   237 mL at 03/22/21 1348   ferrous sulfate tablet 325 mg  325 mg Oral BID WC Arrien, Burchill Ram, MD   325 mg at 03/22/21 0918   levalbuterol (XOPENEX) nebulizer solution 0.63 mg  0.63 mg Nebulization Q6H PRN Laqueta Due, DO       midodrine (PROAMATINE) tablet 10 mg  10 mg Oral TID WC MacNeil, Richard G, DO   10 mg at 03/22/21 1140   ondansetron (ZOFRAN) tablet 4 mg  4 mg Oral Q6H PRN Laqueta Due, DO       Or   ondansetron (ZOFRAN) injection 4 mg  4 mg Intravenous Q6H PRN MacNeil, Richard G, DO       pantoprazole (PROTONIX) EC tablet 40 mg  40 mg Oral Daily Laqueta Due, DO   40 mg at 03/22/21 9735   polyethylene glycol (MIRALAX / GLYCOLAX) packet 17 g  17 g Oral Daily Laqueta Due, DO   17 g at 03/20/21 1032   tamsulosin (FLOMAX) capsule 0.4 mg  0.4 mg Oral QPC supper Laqueta Due, DO   0.4 mg at 03/21/21 1758   thiamine tablet 250 mg  250 mg Oral BID Coralie Keens, MD   250 mg at 03/22/21 0914   traMADol (ULTRAM) tablet 25 mg  25 mg Oral BID Coralie Keens, MD   25 mg at 03/22/21 0915   vitamin B-12 (CYANOCOBALAMIN) tablet 1,000 mcg  1,000 mcg Oral Daily Erick Blinks, MD   1,000 mcg at 03/22/21 0920     Discharge Medications: Please see discharge summary for a list of discharge medications.  Relevant Imaging Results:  Relevant Lab Results:   Additional Information SSN: [id number]. Unvaccinated.  Lorri Frederick, LCSW

## 2021-03-23 ENCOUNTER — Other Ambulatory Visit: Payer: Self-pay

## 2021-03-23 LAB — BASIC METABOLIC PANEL
Anion gap: 8 (ref 5–15)
BUN: 33 mg/dL — ABNORMAL HIGH (ref 8–23)
CO2: 27 mmol/L (ref 22–32)
Calcium: 8.5 mg/dL — ABNORMAL LOW (ref 8.9–10.3)
Chloride: 104 mmol/L (ref 98–111)
Creatinine, Ser: 2.47 mg/dL — ABNORMAL HIGH (ref 0.61–1.24)
GFR, Estimated: 28 mL/min — ABNORMAL LOW (ref >60)
Glucose, Bld: 127 mg/dL — ABNORMAL HIGH (ref 70–99)
Potassium: 4.4 mmol/L (ref 3.5–5.1)
Sodium: 139 mmol/L (ref 135–145)

## 2021-03-23 LAB — CBC WITH DIFFERENTIAL/PLATELET
Abs Immature Granulocytes: 0.1 10*3/uL — ABNORMAL HIGH (ref 0.00–0.07)
Basophils Absolute: 0 10*3/uL (ref 0.0–0.1)
Basophils Relative: 0 %
Eosinophils Absolute: 0.1 10*3/uL (ref 0.0–0.5)
Eosinophils Relative: 0 %
HCT: 31.4 % — ABNORMAL LOW (ref 39.0–52.0)
Hemoglobin: 9.9 g/dL — ABNORMAL LOW (ref 13.0–17.0)
Immature Granulocytes: 1 %
Lymphocytes Relative: 11 %
Lymphs Abs: 1.5 10*3/uL (ref 0.7–4.0)
MCH: 26.5 pg (ref 26.0–34.0)
MCHC: 31.5 g/dL (ref 30.0–36.0)
MCV: 84.2 fL (ref 80.0–100.0)
Monocytes Absolute: 1.1 10*3/uL — ABNORMAL HIGH (ref 0.1–1.0)
Monocytes Relative: 8 %
Neutro Abs: 11 10*3/uL — ABNORMAL HIGH (ref 1.7–7.7)
Neutrophils Relative %: 80 %
Platelets: 464 10*3/uL — ABNORMAL HIGH (ref 150–400)
RBC: 3.73 MIL/uL — ABNORMAL LOW (ref 4.22–5.81)
RDW: 18.6 % — ABNORMAL HIGH (ref 11.5–15.5)
WBC: 13.7 10*3/uL — ABNORMAL HIGH (ref 4.0–10.5)
nRBC: 0 % (ref 0.0–0.2)

## 2021-03-23 MED ORDER — METRONIDAZOLE 500 MG PO TABS
500.0000 mg | ORAL_TABLET | Freq: Three times a day (TID) | ORAL | 0 refills | Status: AC
  Filled 2021-03-23: qty 6, 2d supply, fill #0

## 2021-03-23 MED ORDER — METRONIDAZOLE 500 MG PO TABS
500.0000 mg | ORAL_TABLET | Freq: Three times a day (TID) | ORAL | Status: DC
  Administered 2021-03-23: 500 mg via ORAL
  Filled 2021-03-23: qty 1

## 2021-03-23 MED ORDER — TRAMADOL HCL 50 MG PO TABS: 25.0000 mg | ORAL_TABLET | Freq: Two times a day (BID) | ORAL | 0 refills | Status: DC

## 2021-03-23 NOTE — Discharge Summary (Addendum)
Physician Discharge Summary  Jeffery Christian OVA:[id number] DOB: 10-10-1950 DOA: 03/19/2021  PCP: Charlott Rakes, MD  Admit date: 03/19/2021 Discharge date: 03/23/2021  Admitted From: SNF  Disposition:  SNF   Recommendations for Outpatient Follow-up and new medication changes:  Follow up with Dr. Margarita Rana in 7 to 10 days.  Continue antibiotic therapy with Ceftriaxone IV and oral metronidazole via PICC line with stop date 03/25/2021. Follow with ID clinic as scheduled.  Follow up with ID, Dr Megan Salon before discontinuation of PICC line.    Home Health: na   Equipment/Devices: na    Discharge Condition: stable  CODE STATUS: DNR   Diet recommendation:  heart healthy   Brief/Interim Summary: Mr. Hartstein was admitted to the hospital with the working diagnosis of metabolic encephalopathy.    70 year old male past medical history for chronic atrial fibrillation, chronic kidney disease stage III, hypertension, prediabetes, gout and sacral osteomyelitis who presented with altered mental status.  Patient is a nursing home resident, he was found to have acute changes mentation, possible facial droop that prompted him to be brought to the hospital for further evaluation.  On initial physical examination he was alert but confused, disoriented, heart rate 110 bpm, respiratory rate 27, blood pressure 87/68, 116/97, temperature 97.8.  His lungs were clear to auscultation bilaterally, heart S1-S2, irregularly irregular, abdomen soft, no lower extremity edema.   Sodium 139, potassium 4.3, chloride 104, bicarb 26, glucose 143, BUN 29, creatinine 2.12, white count 9.9, hemoglobin 10.4, hematocrit 33.8, platelets 436. SARS COVID-19 negative.   Urinalysis specific gravity 1.010, > 50 red cells,> 50 white cells.   Head/neck CT angiography with no acute intracranial changes, no emergent large vessel occlusion.  Bifrontal encephalomalacia.  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   EKG 127 bpm,  normal axis, normal intervals, atrial fibrillation rhythm, no ST segment elevation or depression, low voltage.   Patient was placed on IV fluids and continue with IV antibiotic therapy with good toleration. He did require diltiazem infusion for rate control, atrial fibrillation, posteriorly transition to oral.   He ruled out for acute CVA.    His mentation continue to improve, back to his baseline.   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 (No sepsis).  He was admitted to the medical ward, he was placed on a remote telemetry monitor.  With supportive medical therapy including intravenous fluids, vitamin repletion and IV antibiotics his symptoms improved.  Likely multifactorial encephalopathy, he does have structural brain disease that makes him high risk for recurrent encephalopathy.  Cannot rule out baseline cognitive impairment.  He is already on intravenous ceftriaxone that will be continued until June 24 as scheduled. Continue thiamine and B12 supplementation. Follow-up as an outpatient.  2.  Atrial fibrillation with a rapid ventricular response.  Patient required intravenous diltiazem for rate control, then successfully transitioned to oral diltiazem. Continue anticoagulation with apixaban.  3.  Acute kidney injury on chronic kidney disease stage II, baseline creatinine 1.2.  With supportive medical therapy his kidney function improved, at discharge sodium 139, potassium 4.4, chloride 104, bicarb 27, glucose 127, BUN 33 and creatinine 2.47.  4.  Hypertension/dyslipidemia.  Patient received intravenous fluids and midodrine.  His blood pressure has remained stable. Continue atorvastatin.  5.  Stage IV sacral decubitus ulcer.  Pressure wound.  Present on admission.  1 cm x 1 cm x 5.3 cm tunneling.  Wound bed pink.  Continue wound  care as instructed. His discharge  wbc is 13,7.    6.  Anemia of chronic disease, iron deficiency.  His hemoglobin and hematocrit remained stable, continue oral iron supplementation.  7.  Gout.  No signs of acute flare, continue allopurinol and colchicine.  Discharge Diagnoses:  Active Problems:   Persistent atrial fibrillation (HCC)   Atrial fibrillation with rapid ventricular response (HCC)   Acute renal failure superimposed on stage 3 chronic kidney disease ([place])   Sacral osteomyelitis ([place])   Metabolic encephalopathy    Discharge Instructions   Allergies as of 03/23/2021   No Known Allergies      Medication List     STOP taking these medications    acetaminophen 500 MG tablet Commonly known as: TYLENOL   acetaminophen 650 MG suppository Commonly known as: TYLENOL       TAKE these medications    allopurinol 300 MG tablet Commonly known as: ZYLOPRIM Take 150 mg by mouth daily.   apixaban 5 MG Tabs tablet Commonly known as: ELIQUIS Take 1 tablet (5 mg total) by mouth 2 (two) times daily.   atorvastatin 20 MG tablet Commonly known as: LIPITOR Take 1 tablet (20 mg total) by mouth every evening.   bisacodyl 10 MG suppository Commonly known as: DULCOLAX Place 10 mg rectally See admin instructions. If not relieved by milk of mag, give $RemoveB'10mg'mJsmUhWu$  bisocodyl suppository rectally for one dose in 24 hours as needed for constipation   cefTRIAXone  IVPB Commonly known as: ROCEPHIN Inject 2 g into the vein daily for 10 days. Indication:  Sacral osteomyelitis First Dose: Yes Last Day of Therapy:  03/25/21 Labs - Once weekly:  CBC/D and BMP, Labs - Every other week:  ESR and CRP Method of administration: IV Push Method of administration may be changed at the discretion of home infusion pharmacist based upon assessment of the patient and/or caregiver's ability to self-administer the medication ordered.   colchicine 0.6 MG tablet Take 0.6 mg by mouth See admin instructions. 1.2 MG orally AT FIRST SIGN OF  GOUT FLARE UP AND REPEAT 0.6 MG IN 1 HOUR IF SYMPTOMS PERSIST   diltiazem 60 MG tablet Commonly known as: CARDIZEM Take 60 mg by mouth every 8 (eight) hours. Hold for SBP < 100 and Heart rate < 60   divalproex 125 MG capsule Commonly known as: DEPAKOTE SPRINKLE Take 250 mg by mouth 2 (two) times daily.   feeding supplement Liqd Take 237 mLs by mouth 2 (two) times daily between meals.   ferrous sulfate 325 (65 FE) MG tablet Take 325 mg by mouth 2 (two) times daily with a meal.   metroNIDAZOLE 500 MG tablet Commonly known as: FLAGYL Take 1 tablet (500 mg total) by mouth every 8 (eight) hours for 2 days.   midodrine 10 MG tablet Commonly known as: PROAMATINE Take 1 tablet (10 mg total) by mouth 3 (three) times daily with meals.   MILK OF MAGNESIA PO Take 30 mLs by mouth daily as needed (if no bowel movement in 3 days).   pantoprazole 40 MG tablet Commonly known as: PROTONIX Take 1 tablet (40 mg total) by mouth daily.   polyethylene glycol 17 g packet Commonly known as: MIRALAX / GLYCOLAX Take 17 g by mouth daily.   PRO-STAT PO Take 30 mLs by mouth 2 (two) times daily.   RA Saline Enema 19-7 GM/118ML Enem Place 118 mLs rectally See admin instructions. If not relieved by biscodyl suppository, give disposable saline enemal rectally for one dose  in 24 hours as needed for constipation   tamsulosin 0.4 MG Caps capsule Commonly known as: FLOMAX Take 1 capsule (0.4 mg total) by mouth daily after supper.   traMADol 50 MG tablet Commonly known as: Ultram Take 0.5 tablets (25 mg total) by mouth 2 (two) times daily.   vitamin C 500 MG tablet Commonly known as: ASCORBIC ACID Take 500 mg by mouth 2 (two) times daily.               Discharge Care Instructions  (From admission, onward)           Start     Ordered   03/23/21 0000  Discharge wound care:       Comments: Clean the sacral area with no rinse cleanser, pat dry. Apply lodoform packing strip ([place]# [id number])  into the wound loosly leaving a tale for easy removal. Secure with a 2x2 and foam dressing. Change daily.   03/23/21 1026            No Known Allergies  Consultations: Neurology    Procedures/Studies: DG Chest 2 View  Addendum Date: 03/11/2021   ADDENDUM REPORT: 03/11/2021 23:21 ADDENDUM: Left PICC line is in place with the tip in the SVC. Electronically Signed   By: Rolm Baptise M.D.   On: 03/11/2021 23:21   Result Date: 03/11/2021 CLINICAL DATA:  Possible sepsis EXAM: CHEST - 2 VIEW COMPARISON:  03/11/2021 FINDINGS: Cardiomegaly. Low lung volumes with bibasilar atelectasis. No confluent opacities, effusions or edema. IMPRESSION: Low lung volumes with bibasilar atelectasis. Cardiomegaly. Electronically Signed: By: Rolm Baptise M.D. On: 03/11/2021 20:02   MR BRAIN WO CONTRAST  Result Date: 03/20/2021 CLINICAL DATA:  Seizure EXAM: MRI HEAD WITHOUT CONTRAST TECHNIQUE: Multiplanar, multiecho pulse sequences of the brain and surrounding structures were obtained without intravenous contrast. COMPARISON:  11/10/2020 FINDINGS: Brain: No acute infarct, mass effect or extra-axial collection. Numerous chronic microhemorrhages in a mixed central and peripheral distribution. There is multifocal hyperintense T2-weighted signal within the white matter. Generalized volume loss without a clear lobar predilection. Bifrontal encephalomalacia. The midline structures are normal. Vascular: Major flow voids are preserved. Skull and upper cervical spine: Normal calvarium and skull base. Visualized upper cervical spine and soft tissues are normal. Sinuses/Orbits frontal sinus mucosal thickening. No mastoid or middle ear effusion. Normal orbits. IMPRESSION: 1. No acute intracranial abnormality. 2. Bifrontal encephalomalacia and findings of chronic small vessel disease. Electronically Signed   By: Ulyses Jarred M.D.   On: 03/20/2021 22:24   MR CERVICAL SPINE WO CONTRAST  Result Date: 03/21/2021 CLINICAL DATA:   Weakness of upper and lower extremities EXAM: MRI CERVICAL SPINE WITHOUT CONTRAST TECHNIQUE: Multiplanar, multisequence MR imaging of the cervical spine was performed. No intravenous contrast was administered. COMPARISON:  None. FINDINGS: Alignment: No significant listhesis. Vertebrae: Mild degenerate endplate irregularity. Vertebral body heights are otherwise maintained. There is no marrow edema. No suspicious osseous lesion. Cord: No abnormal signal. Posterior Fossa, vertebral arteries, paraspinal tissues: Unremarkable. Disc levels: C2-C3:  No canal or foraminal stenosis. C3-C4:  No canal or foraminal stenosis. C4-C5: Central disc protrusion with endplate osteophytes. Protrusion mildly indents the ventral thecal sac. No canal or foraminal stenosis. C5-C6: Disc bulge with endplate osteophytes. Minor canal and foraminal stenosis. C6-C7:  No canal or foraminal stenosis. C7-T1:  No canal or foraminal stenosis. IMPRESSION: No abnormal cord signal.  No high-grade canal or foraminal stenosis. Electronically Signed   By: Macy Mis M.D.   On: 03/21/2021 15:31   MR THORACIC SPINE  WO CONTRAST  Result Date: 03/21/2021 CLINICAL DATA:  Upper and lower extremity weakness EXAM: MRI THORACIC SPINE WITHOUT CONTRAST TECHNIQUE: Multiplanar, multisequence MR imaging of the thoracic spine was performed. No intravenous contrast was administered. COMPARISON:  None. FINDINGS: Alignment:  Preserved. Vertebrae: Mild degenerative endplate irregularity. Vertebral body heights are otherwise maintained. There is no marrow edema. No suspicious osseous lesion. Cord:  No abnormal signal. Paraspinal and other soft tissues: Unremarkable. Disc levels: Intervertebral disc heights are preserved. There is a right central annular fissure with shallow protrusion at T3-T4. No canal or foraminal stenosis at any level. IMPRESSION: No abnormal cord signal. Minor degenerative changes.  No canal or foraminal stenosis. Electronically Signed   By:  Macy Mis M.D.   On: 03/21/2021 15:34   DG Chest Port 1 View  Result Date: 03/12/2021 CLINICAL DATA:  Shortness of breath, history of CHF EXAM: PORTABLE CHEST 1 VIEW COMPARISON:  Prior chest x-ray 03/11/2021 FINDINGS: Left upper extremity PICC. Catheter tip overlies the mid SVC. Stable cardiomegaly and mediastinal widening likely representing vascular congestion. No focal airspace consolidation, pleural effusion, pulmonary edema or pneumothorax. Mild vascular congestion is similar compared to prior. No acute osseous abnormality. IMPRESSION: Stable chest x-ray without evidence of acute cardiopulmonary process. Electronically Signed   By: Jacqulynn Cadet M.D.   On: 03/12/2021 10:18   DG Chest Port 1 View  Result Date: 03/11/2021 CLINICAL DATA:  Question sepsis.  Altered mental status. EXAM: PORTABLE CHEST 1 VIEW COMPARISON:  11/11/2020 FINDINGS: Cardiomegaly. Aortic atherosclerosis and tortuosity. Pulmonary vascularity is normal. The right lung is clear. Question mild atelectasis or infiltrate at the medial left base. Suggest lateral chest radiography when able. No effusion. IMPRESSION: Cardiomegaly and aortic atherosclerosis. Question mild volume loss or infiltrate at the medial left base. This could be better evaluated with lateral chest radiography when able. Electronically Signed   By: Nelson Chimes M.D.   On: 03/11/2021 15:10   EEG adult  Result Date: 03/20/2021 Lora Havens, MD     03/20/2021  3:21 PM Patient Name: Jeffery Christian MRN: [id number] Epilepsy Attending: Lora Havens Referring Physician/Provider: Dr Lynnae Sandhoff Date: 03/20/2021 Duration: 26.10 mins Patient history: 70 y.o. male  brought in as code stroke for waxing and waning bradylalia. EEG to evaluate for seizure Level of alertness: Awake, asleep AEDs during EEG study: VPA Technical aspects: This EEG study was done with scalp electrodes positioned according to the 10-20 International system of electrode placement. Electrical  activity was acquired at a sampling rate of $Remov'500Hz'KKPVAr$  and reviewed with a high frequency filter of $RemoveB'70Hz'LyGZASJh$  and a low frequency filter of $RemoveB'1Hz'NlwRwvVz$ . EEG data were recorded continuously and digitally stored. Description: The posterior dominant rhythm consists of 8-9 Hz activity of moderate voltage (25-35 uV) seen predominantly in posterior head regions, symmetric and reactive to eye opening and eye closing. Sleep was characterized by vertex waves, sleep spindles (12 to 14 Hz), maximal frontocentral region. Hyperventilation and photic stimulation were not performed.   IMPRESSION: This study is within normal limits. No seizures or epileptiform discharges were seen throughout the recording. Priyanka Barbra Sarks   CT HEAD CODE STROKE WO CONTRAST  Result Date: 03/19/2021 CLINICAL DATA:  Right facial droop and aphasia EXAM: CT HEAD WITHOUT CONTRAST CT ANGIOGRAPHY OF THE HEAD AND NECK TECHNIQUE: Contiguous axial images were obtained from the base of the skull through the vertex without intravenous contrast. Multidetector CT imaging of the head and neck was performed using the standard protocol during bolus administration of intravenous contrast.  Multiplanar CT image reconstructions and MIPs were obtained to evaluate the vascular anatomy. Carotid stenosis measurements (when applicable) are obtained utilizing NASCET criteria, using the distal internal carotid diameter as the denominator. CONTRAST:  53mL OMNIPAQUE IOHEXOL 350 MG/ML SOLN COMPARISON:  None. FINDINGS: CT HEAD FINDINGS Brain: There is no mass, hemorrhage or extra-axial collection. There is generalized atrophy without lobar predilection. Bifrontal encephalomalacia. There is hypoattenuation of the periventricular white matter, most commonly indicating chronic ischemic microangiopathy. Old left basal ganglia small vessel infarct. Skull: The visualized skull base, calvarium and extracranial soft tissues are normal. Sinuses/Orbits: No fluid levels or advanced mucosal thickening of  the visualized paranasal sinuses. No mastoid or middle ear effusion. The orbits are normal. ASPECTS ([place] Stroke Program Early CT Score) - Ganglionic level infarction (caudate, lentiform nuclei, internal capsule, insula, M1-M3 cortex): 7 - Supraganglionic infarction (M4-M6 cortex): 3 Total score (0-10 with 10 being normal): 10 Review of the MIP images confirms the above findings CTA NECK FINDINGS SKELETON: There is no bony spinal canal stenosis. No lytic or blastic lesion. OTHER NECK: Normal pharynx, larynx and major salivary glands. No cervical lymphadenopathy. Unremarkable thyroid gland. UPPER CHEST: Bibasilar atelectasis. AORTIC ARCH: There is no calcific atherosclerosis of the aortic arch. There is no aneurysm, dissection or hemodynamically significant stenosis of the visualized portion of the aorta. Conventional 3 vessel aortic branching pattern. The visualized proximal subclavian arteries are widely patent. RIGHT CAROTID SYSTEM: Normal without aneurysm, dissection or stenosis. LEFT CAROTID SYSTEM: Normal without aneurysm, dissection or stenosis. VERTEBRAL ARTERIES: Left dominant configuration. Both origins are clearly patent. There is no dissection, occlusion or flow-limiting stenosis to the skull base (V1-V3 segments). CTA HEAD FINDINGS POSTERIOR CIRCULATION: --Vertebral arteries: Normal V4 segments. --Inferior cerebellar arteries: Normal. --Basilar artery: Normal. --Superior cerebellar arteries: Normal. --Posterior cerebral arteries (PCA): Normal. ANTERIOR CIRCULATION: --Intracranial internal carotid arteries: Normal. 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 Left remains patent. --Middle cerebral arteries (MCA): Normal. VENOUS SINUSES: As permitted by contrast timing, patent. ANATOMIC VARIANTS: None Review of the MIP images confirms the above findings. IMPRESSION: 1. No acute intracranial abnormality.  ASPECTS is 10. 2. No emergent large vessel  occlusion or high-grade stenosis. 3. 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 4. 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 These results were communicated to Lynnae Sandhoff at 9:50 pm on 03/19/2021 by text page via the [facility] messaging system. Electronically Signed   By: Ulyses Jarred M.D.   On: 03/19/2021 21:50   CT ANGIO HEAD CODE STROKE  Result Date: 03/19/2021 CLINICAL DATA:  Right facial droop and aphasia EXAM: CT HEAD WITHOUT CONTRAST CT ANGIOGRAPHY OF THE HEAD AND NECK TECHNIQUE: Contiguous axial images were obtained from the base of the skull through the vertex without intravenous contrast. Multidetector CT imaging of the head and neck was performed using the standard protocol during bolus administration of intravenous contrast. Multiplanar CT image reconstructions and MIPs were obtained to evaluate the vascular anatomy. Carotid stenosis measurements (when applicable) are obtained utilizing NASCET criteria, using the distal internal carotid diameter as the denominator. CONTRAST:  82mL OMNIPAQUE IOHEXOL 350 MG/ML SOLN COMPARISON:  None. FINDINGS: CT HEAD FINDINGS Brain: There is no mass, hemorrhage or extra-axial collection. There is generalized atrophy without lobar predilection. Bifrontal encephalomalacia. There is hypoattenuation of the periventricular white matter, most commonly indicating chronic ischemic microangiopathy. Old left basal ganglia small vessel infarct. Skull: The visualized skull base, calvarium and extracranial soft tissues are normal. Sinuses/Orbits: No fluid levels or  advanced mucosal thickening of the visualized paranasal sinuses. No mastoid or middle ear effusion. The orbits are normal. ASPECTS ([place] Stroke Program Early CT Score) - Ganglionic level infarction (caudate, lentiform nuclei, internal capsule, insula, M1-M3 cortex): 7 - Supraganglionic infarction (M4-M6 cortex): 3 Total score (0-10 with 10 being normal): 10 Review of  the MIP images confirms the above findings CTA NECK FINDINGS SKELETON: There is no bony spinal canal stenosis. No lytic or blastic lesion. OTHER NECK: Normal pharynx, larynx and major salivary glands. No cervical lymphadenopathy. Unremarkable thyroid gland. UPPER CHEST: Bibasilar atelectasis. AORTIC ARCH: There is no calcific atherosclerosis of the aortic arch. There is no aneurysm, dissection or hemodynamically significant stenosis of the visualized portion of the aorta. Conventional 3 vessel aortic branching pattern. The visualized proximal subclavian arteries are widely patent. RIGHT CAROTID SYSTEM: Normal without aneurysm, dissection or stenosis. LEFT CAROTID SYSTEM: Normal without aneurysm, dissection or stenosis. VERTEBRAL ARTERIES: Left dominant configuration. Both origins are clearly patent. There is no dissection, occlusion or flow-limiting stenosis to the skull base (V1-V3 segments). CTA HEAD FINDINGS POSTERIOR CIRCULATION: --Vertebral arteries: Normal V4 segments. --Inferior cerebellar arteries: Normal. --Basilar artery: Normal. --Superior cerebellar arteries: Normal. --Posterior cerebral arteries (PCA): Normal. ANTERIOR CIRCULATION: --Intracranial internal carotid arteries: Normal. 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 Left remains patent. --Middle cerebral arteries (MCA): Normal. VENOUS SINUSES: As permitted by contrast timing, patent. ANATOMIC VARIANTS: None Review of the MIP images confirms the above findings. IMPRESSION: 1. No acute intracranial abnormality.  ASPECTS is 10. 2. No emergent large vessel occlusion or high-grade stenosis. 3. 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 4. 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 These results were communicated to Lynnae Sandhoff at 9:50 pm on 03/19/2021 by text page via the [facility] messaging system. Electronically Signed   By: Ulyses Jarred M.D.   On:  03/19/2021 21:50   CT ANGIO NECK CODE STROKE  Result Date: 03/19/2021 CLINICAL DATA:  Right facial droop and aphasia EXAM: CT HEAD WITHOUT CONTRAST CT ANGIOGRAPHY OF THE HEAD AND NECK TECHNIQUE: Contiguous axial images were obtained from the base of the skull through the vertex without intravenous contrast. Multidetector CT imaging of the head and neck was performed using the standard protocol during bolus administration of intravenous contrast. Multiplanar CT image reconstructions and MIPs were obtained to evaluate the vascular anatomy. Carotid stenosis measurements (when applicable) are obtained utilizing NASCET criteria, using the distal internal carotid diameter as the denominator. CONTRAST:  58mL OMNIPAQUE IOHEXOL 350 MG/ML SOLN COMPARISON:  None. FINDINGS: CT HEAD FINDINGS Brain: There is no mass, hemorrhage or extra-axial collection. There is generalized atrophy without lobar predilection. Bifrontal encephalomalacia. There is hypoattenuation of the periventricular white matter, most commonly indicating chronic ischemic microangiopathy. Old left basal ganglia small vessel infarct. Skull: The visualized skull base, calvarium and extracranial soft tissues are normal. Sinuses/Orbits: No fluid levels or advanced mucosal thickening of the visualized paranasal sinuses. No mastoid or middle ear effusion. The orbits are normal. ASPECTS ([place] Stroke Program Early CT Score) - Ganglionic level infarction (caudate, lentiform nuclei, internal capsule, insula, M1-M3 cortex): 7 - Supraganglionic infarction (M4-M6 cortex): 3 Total score (0-10 with 10 being normal): 10 Review of the MIP images confirms the above findings CTA NECK FINDINGS SKELETON: There is no bony spinal canal stenosis. No lytic or blastic lesion. OTHER NECK: Normal pharynx, larynx and major salivary glands. No cervical lymphadenopathy. Unremarkable thyroid gland. UPPER CHEST: Bibasilar atelectasis. AORTIC ARCH: There is no calcific atherosclerosis  of  the aortic arch. There is no aneurysm, dissection or hemodynamically significant stenosis of the visualized portion of the aorta. Conventional 3 vessel aortic branching pattern. The visualized proximal subclavian arteries are widely patent. RIGHT CAROTID SYSTEM: Normal without aneurysm, dissection or stenosis. LEFT CAROTID SYSTEM: Normal without aneurysm, dissection or stenosis. VERTEBRAL ARTERIES: Left dominant configuration. Both origins are clearly patent. There is no dissection, occlusion or flow-limiting stenosis to the skull base (V1-V3 segments). CTA HEAD FINDINGS POSTERIOR CIRCULATION: --Vertebral arteries: Normal V4 segments. --Inferior cerebellar arteries: Normal. --Basilar artery: Normal. --Superior cerebellar arteries: Normal. --Posterior cerebral arteries (PCA): Normal. ANTERIOR CIRCULATION: --Intracranial internal carotid arteries: Normal. 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 Left remains patent. --Middle cerebral arteries (MCA): Normal. VENOUS SINUSES: As permitted by contrast timing, patent. ANATOMIC VARIANTS: None Review of the MIP images confirms the above findings. IMPRESSION: 1. No acute intracranial abnormality.  ASPECTS is 10. 2. No emergent large vessel occlusion or high-grade stenosis. 3. 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 4. 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 These results were communicated to Lynnae Sandhoff at 9:50 pm on 03/19/2021 by text page via the [facility] messaging system. Electronically Signed   By: Ulyses Jarred M.D.   On: 03/19/2021 21:50       Subjective: Patient is feeling well, no nausea or vomiting, no dyspnea or chest pain.   Discharge Exam: Vitals:   03/23/21 0551 03/23/21 0858  BP: 106/81 102/67  Pulse: (!) 102 82  Resp: 18   Temp: 97.6 F (36.4 C) 97.8 F (36.6 C)  SpO2: 99%    Vitals:   03/22/21 1412 03/22/21 1937 03/23/21 0551 03/23/21  0858  BP: 111/80 114/68 106/81 102/67  Pulse: 98 91 (!) 102 82  Resp: $Remo'16 18 18   'MHTmF$ Temp:  (!) 97.3 F (36.3 C) 97.6 F (36.4 C) 97.8 F (36.6 C)  TempSrc:  Oral Oral   SpO2:  100% 99%   Weight:        General: Not in pain or dyspnea.  Neurology: Awake and alert, non focal  E ENT: no pallor, no icterus, oral mucosa moist Cardiovascular: No JVD. S1-S2 present, rhythmic, no gallops, rubs, or murmurs. No lower extremity edema. Pulmonary: Positive breath sounds bilaterally, adequate air movement, no wheezing, rhonchi or rales. Gastrointestinal. Abdomen soft and non tender Skin. Stage 4 pressure decubitus ulcer.  Musculoskeletal: no joint deformities       The results of significant diagnostics from this hospitalization (including imaging, microbiology, ancillary and laboratory) are listed below for reference.     Microbiology: Recent Results (from the past 240 hour(s))  SARS CORONAVIRUS 2 (TAT 6-24 HRS) Nasopharyngeal Nasopharyngeal Swab     Status: None   Collection Time: 03/14/21 10:41 AM   Specimen: Nasopharyngeal Swab  Result Value Ref Range Status   SARS Coronavirus 2 NEGATIVE NEGATIVE Final    Comment: (NOTE) SARS-CoV-2 target nucleic acids are NOT DETECTED.  The SARS-CoV-2 RNA is generally detectable in upper and lower respiratory specimens during the acute phase of infection. Negative results do not preclude SARS-CoV-2 infection, do not rule out co-infections with other pathogens, and should not be used as the sole basis for treatment or other patient management decisions. Negative results must be combined with clinical observations, patient history, and epidemiological information. The expected result is Negative.  Fact Sheet for Patients: SugarRoll.be  Fact Sheet for Healthcare Providers: https://www.woods-mathews.com/  This test is not yet approved or cleared by the  [place] Architectural technologist and  has been authorized for  detection and/or diagnosis of SARS-CoV-2 by FDA under an Print production planner (EUA). This EUA will remain  in effect (meaning this test can be used) for the duration of the COVID-19 declaration under Se ction 564(b)(1) of the Act, 21 U.S.C. section 360bbb-3(b)(1), unless the authorization is terminated or revoked sooner.  Performed at [facility], [place] [address], [place], [place] [address]   Resp Panel by RT-PCR (Flu A&B, Covid) Nasopharyngeal Swab     Status: None   Collection Time: 03/19/21 10:53 PM   Specimen: Nasopharyngeal Swab; Nasopharyngeal(NP) swabs in vial transport medium  Result Value Ref Range Status   SARS Coronavirus 2 by RT PCR NEGATIVE NEGATIVE Final    Comment: (NOTE) SARS-CoV-2 target nucleic acids are NOT DETECTED.  The SARS-CoV-2 RNA is generally detectable in upper respiratory specimens during the acute phase of infection. The lowest concentration of SARS-CoV-2 viral copies this assay can detect is 138 copies/mL. A negative result does not preclude SARS-Cov-2 infection and should not be used as the sole basis for treatment or other patient management decisions. A negative result may occur with  improper specimen collection/handling, submission of specimen other than nasopharyngeal swab, presence of viral mutation(s) within the areas targeted by this assay, and inadequate number of viral copies(<138 copies/mL). A negative result must be combined with clinical observations, patient history, and epidemiological information. The expected result is Negative.  Fact Sheet for Patients:  EntrepreneurPulse.com.au  Fact Sheet for Healthcare Providers:  IncredibleEmployment.be  This test is no t yet approved or cleared by the [place] FDA and  has been authorized for detection and/or diagnosis of SARS-CoV-2 by FDA under an Emergency Use Authorization (EUA). This EUA will remain  in effect (meaning this test can  be used) for the duration of the COVID-19 declaration under Section 564(b)(1) of the Act, 21 U.S.C.section 360bbb-3(b)(1), unless the authorization is terminated  or revoked sooner.       Influenza A by PCR NEGATIVE NEGATIVE Final   Influenza B by PCR NEGATIVE NEGATIVE Final    Comment: (NOTE) The Xpert Xpress SARS-CoV-2/FLU/RSV plus assay is intended as an aid in the diagnosis of influenza from Nasopharyngeal swab specimens and should not be used as a sole basis for treatment. Nasal washings and aspirates are unacceptable for Xpert Xpress SARS-CoV-2/FLU/RSV testing.  Fact Sheet for Patients: EntrepreneurPulse.com.au  Fact Sheet for Healthcare Providers: IncredibleEmployment.be  This test is not yet approved or cleared by the [place] FDA and has been authorized for detection and/or diagnosis of SARS-CoV-2 by FDA under an Emergency Use Authorization (EUA). This EUA will remain in effect (meaning this test can be used) for the duration of the COVID-19 declaration under Section 564(b)(1) of the Act, 21 U.S.C. section 360bbb-3(b)(1), unless the authorization is terminated or revoked.  Performed at [facility], [place] [address], [place], [place] [address]   SARS CORONAVIRUS 2 (TAT 6-24 HRS) Nasopharyngeal Nasopharyngeal Swab     Status: None   Collection Time: 03/22/21  9:49 AM   Specimen: Nasopharyngeal Swab  Result Value Ref Range Status   SARS Coronavirus 2 NEGATIVE NEGATIVE Final    Comment: (NOTE) SARS-CoV-2 target nucleic acids are NOT DETECTED.  The SARS-CoV-2 RNA is generally detectable in upper and lower respiratory specimens during the acute phase of infection. Negative results do not preclude SARS-CoV-2 infection, do not rule out co-infections with other pathogens, and should not be used as the sole basis  for treatment or other patient management decisions. Negative results must be combined with clinical  observations, patient history, and epidemiological information. The expected result is Negative.  Fact Sheet for Patients: SugarRoll.be  Fact Sheet for Healthcare Providers: https://www.woods-mathews.com/  This test is not yet approved or cleared by the [place] FDA and  has been authorized for detection and/or diagnosis of SARS-CoV-2 by FDA under an Emergency Use Authorization (EUA). This EUA will remain  in effect (meaning this test can be used) for the duration of the COVID-19 declaration under Se ction 564(b)(1) of the Act, 21 U.S.C. section 360bbb-3(b)(1), unless the authorization is terminated or revoked sooner.  Performed at [facility], [place] [address], [place], [place] [address]      Labs: BNP (last 3 results) Recent Labs    03/14/21 0405 03/15/21 0348 03/16/21 0320  BNP 97.8 99.5 15.8   Basic Metabolic Panel: Recent Labs  Lab 03/19/21 2115 03/19/21 2120 03/20/21 0429 03/21/21 0501 03/22/21 0445 03/23/21 0500  NA 139 142 142 143 140 139  K 4.3 4.3 4.7 4.4 4.5 4.4  CL 104 105 108 107 102 104  CO2 26  --  $R'26 29 28 27  'Lt$ GLUCOSE 143* 139* 109* 129* 114* 127*  BUN 29* 27* 30* 31* 33* 33*  CREATININE 2.12* 2.10* 2.16* 2.28* 2.52* 2.47*  CALCIUM 8.5*  --  8.7* 8.6* 8.6* 8.5*  MG  --   --  2.1  --   --   --   PHOS  --   --  4.9*  --   --   --    Liver Function Tests: Recent Labs  Lab 03/19/21 2115 03/20/21 0429  AST 11* 10*  ALT 10 8  ALKPHOS 44 44  BILITOT 0.4 0.7  PROT 5.4* 5.6*  ALBUMIN 2.0* 2.0*   No results for input(s): LIPASE, AMYLASE in the last 168 hours. No results for input(s): AMMONIA in the last 168 hours. CBC: Recent Labs  Lab 03/19/21 2115 03/19/21 2120 03/20/21 0429 03/21/21 0501 03/22/21 0445 03/23/21 0500  WBC 9.9  --  10.6* 11.0* 13.4* 13.7*  NEUTROABS 6.8  --   --  8.3* 10.8* 11.0*  HGB 10.4* 10.2* 10.6* 10.3* 9.6* 9.9*  HCT 33.8* 30.0* 33.9* 33.4* 30.3* 31.4*  MCV  86.9  --  84.8 85.4 84.4 84.2  PLT 436*  --  434* 425* 440* 464*   Cardiac Enzymes: No results for input(s): CKTOTAL, CKMB, CKMBINDEX, TROPONINI in the last 168 hours. BNP: Invalid input(s): POCBNP CBG: Recent Labs  Lab 03/19/21 2112  GLUCAP 140*   D-Dimer No results for input(s): DDIMER in the last 72 hours. Hgb A1c No results for input(s): HGBA1C in the last 72 hours. Lipid Profile No results for input(s): CHOL, HDL, LDLCALC, TRIG, CHOLHDL, LDLDIRECT in the last 72 hours. Thyroid function studies Recent Labs    03/21/21 1203  TSH 5.052*   Anemia work up Recent Labs    03/21/21 1203  VITAMINB12 347   Urinalysis    Component Value Date/Time   COLORURINE YELLOW 03/19/2021 2112   APPEARANCEUR CLOUDY (A) 03/19/2021 2112   LABSPEC 1.010 03/19/2021 2112   PHURINE 7.0 03/19/2021 2112   GLUCOSEU NEGATIVE 03/19/2021 2112   HGBUR LARGE (A) 03/19/2021 2112   BILIRUBINUR NEGATIVE 03/19/2021 2112   BILIRUBINUR negative 06/08/2020 [place] 03/19/2021 2112   PROTEINUR NEGATIVE 03/19/2021 2112   UROBILINOGEN 0.2 06/08/2020 1632   NITRITE NEGATIVE 03/19/2021 2112   LEUKOCYTESUR LARGE (  A) 03/19/2021 2112   Sepsis Labs Invalid input(s): PROCALCITONIN,  WBC,  LACTICIDVEN Microbiology Recent Results (from the past 240 hour(s))  SARS CORONAVIRUS 2 (TAT 6-24 HRS) Nasopharyngeal Nasopharyngeal Swab     Status: None   Collection Time: 03/14/21 10:41 AM   Specimen: Nasopharyngeal Swab  Result Value Ref Range Status   SARS Coronavirus 2 NEGATIVE NEGATIVE Final    Comment: (NOTE) SARS-CoV-2 target nucleic acids are NOT DETECTED.  The SARS-CoV-2 RNA is generally detectable in upper and lower respiratory specimens during the acute phase of infection. Negative results do not preclude SARS-CoV-2 infection, do not rule out co-infections with other pathogens, and should not be used as the sole basis for treatment or other patient management decisions. Negative results  must be combined with clinical observations, patient history, and epidemiological information. The expected result is Negative.  Fact Sheet for Patients: SugarRoll.be  Fact Sheet for Healthcare Providers: https://www.woods-mathews.com/  This test is not yet approved or cleared by the [place] FDA and  has been authorized for detection and/or diagnosis of SARS-CoV-2 by FDA under an Emergency Use Authorization (EUA). This EUA will remain  in effect (meaning this test can be used) for the duration of the COVID-19 declaration under Se ction 564(b)(1) of the Act, 21 U.S.C. section 360bbb-3(b)(1), unless the authorization is terminated or revoked sooner.  Performed at [facility], [place] [address], [place],  [address]   Resp Panel by RT-PCR (Flu A&B, Covid) Nasopharyngeal Swab     Status: None   Collection Time: 03/19/21 10:53 PM   Specimen: Nasopharyngeal Swab; Nasopharyngeal(NP) swabs in vial transport medium  Result Value Ref Range Status   SARS Coronavirus 2 by RT PCR NEGATIVE NEGATIVE Final    Comment: (NOTE) SARS-CoV-2 target nucleic acids are NOT DETECTED.  The SARS-CoV-2 RNA is generally detectable in upper respiratory specimens during the acute phase of infection. The lowest concentration of SARS-CoV-2 viral copies this assay can detect is 138 copies/mL. A negative result does not preclude SARS-Cov-2 infection and should not be used as the sole basis for treatment or other patient management decisions. A negative result may occur with  improper specimen collection/handling, submission of specimen other than nasopharyngeal swab, presence of viral mutation(s) within the areas targeted by this assay, and inadequate number of viral copies(<138 copies/mL). A negative result must be combined with clinical observations, patient history, and epidemiological information. The expected result is Negative.  Fact Sheet for  Patients:  EntrepreneurPulse.com.au  Fact Sheet for Healthcare Providers:  IncredibleEmployment.be  This test is no t yet approved or cleared by the [place] FDA and  has been authorized for detection and/or diagnosis of SARS-CoV-2 by FDA under an Emergency Use Authorization (EUA). This EUA will remain  in effect (meaning this test can be used) for the duration of the COVID-19 declaration under Section 564(b)(1) of the Act, 21 U.S.C.section 360bbb-3(b)(1), unless the authorization is terminated  or revoked sooner.       Influenza A by PCR NEGATIVE NEGATIVE Final   Influenza B by PCR NEGATIVE NEGATIVE Final    Comment: (NOTE) The Xpert Xpress SARS-CoV-2/FLU/RSV plus assay is intended as an aid in the diagnosis of influenza from Nasopharyngeal swab specimens and should not be used as a sole basis for treatment. Nasal washings and aspirates are unacceptable for Xpert Xpress SARS-CoV-2/FLU/RSV testing.  Fact Sheet for Patients: EntrepreneurPulse.com.au  Fact Sheet for Healthcare Providers: IncredibleEmployment.be  This test is not yet approved or cleared by the [place]  FDA and has been authorized for detection and/or diagnosis of SARS-CoV-2 by FDA under an Emergency Use Authorization (EUA). This EUA will remain in effect (meaning this test can be used) for the duration of the COVID-19 declaration under Section 564(b)(1) of the Act, 21 U.S.C. section 360bbb-3(b)(1), unless the authorization is terminated or revoked.  Performed at [facility], [place] [address] [place], [place] [address]   SARS CORONAVIRUS 2 (TAT 6-24 HRS) Nasopharyngeal Nasopharyngeal Swab     Status: None   Collection Time: 03/22/21  9:49 AM   Specimen: Nasopharyngeal Swab  Result Value Ref Range Status   SARS Coronavirus 2 NEGATIVE NEGATIVE Final    Comment: (NOTE) SARS-CoV-2 target nucleic acids are NOT DETECTED.  The  SARS-CoV-2 RNA is generally detectable in upper and lower respiratory specimens during the acute phase of infection. Negative results do not preclude SARS-CoV-2 infection, do not rule out co-infections with other pathogens, and should not be used as the sole basis for treatment or other patient management decisions. Negative results must be combined with clinical observations, patient history, and epidemiological information. The expected result is Negative.  Fact Sheet for Patients: SugarRoll.be  Fact Sheet for Healthcare Providers: https://www.woods-mathews.com/  This test is not yet approved or cleared by the [place] FDA and  has been authorized for detection and/or diagnosis of SARS-CoV-2 by FDA under an Emergency Use Authorization (EUA). This EUA will remain  in effect (meaning this test can be used) for the duration of the COVID-19 declaration under Se ction 564(b)(1) of the Act, 21 U.S.C. section 360bbb-3(b)(1), unless the authorization is terminated or revoked sooner.  Performed at [facility], [place] [address], [place], [place] [address]      Time coordinating discharge: 45 minutes  SIGNED:   Tawni Millers, MD  [facility] 03/23/2021, 9:52 AM

## 2021-03-23 NOTE — TOC Transition Note (Signed)
Transition of Care [facility]) - CM/SW Discharge Note   Patient Details  Name: Jeffery Christian MRN: [id number] Date of Birth: Feb 08, 1951  [facility] CM/SW Contact:  Lorri Frederick, LCSW Phone Number: 03/23/2021, 11:09 AM   Clinical Narrative:   Pt discharging to [place], room 201B.  RN call report to [phone].     Final next level of care: Skilled Nursing Facility Barriers to Discharge: Barriers Resolved   Patient Goals and CMS Choice Patient states their goals for this hospitalization and ongoing recovery are:: "move back home" CMS Medicare.gov Compare Post Acute Care list provided to::  (Pt agreeable to return to [facility])    Discharge Placement              Patient chooses bed at: [facility] Patient to be transferred to facility by: [facility] Name of family member notified: daughter [place] Patient and family notified of of transfer: 03/23/21  Discharge Plan and Services     Post Acute Care Choice: Skilled Nursing Facility                               Social Determinants of Health (SDOH) Interventions     Readmission Risk Interventions No flowsheet data found.

## 2021-03-24 ENCOUNTER — Encounter: Payer: Self-pay | Admitting: Internal Medicine

## 2021-03-24 ENCOUNTER — Non-Acute Institutional Stay (SKILLED_NURSING_FACILITY): Payer: Medicare Other | Admitting: Internal Medicine

## 2021-03-24 DIAGNOSIS — G9341 Metabolic encephalopathy: Secondary | ICD-10-CM

## 2021-03-24 DIAGNOSIS — I4819 Other persistent atrial fibrillation: Secondary | ICD-10-CM | POA: Diagnosis not present

## 2021-03-24 DIAGNOSIS — A419 Sepsis, unspecified organism: Secondary | ICD-10-CM | POA: Diagnosis not present

## 2021-03-24 DIAGNOSIS — R339 Retention of urine, unspecified: Secondary | ICD-10-CM

## 2021-03-24 DIAGNOSIS — N1832 Chronic kidney disease, stage 3b: Secondary | ICD-10-CM

## 2021-03-24 DIAGNOSIS — M4628 Osteomyelitis of vertebra, sacral and sacrococcygeal region: Secondary | ICD-10-CM

## 2021-03-24 LAB — VITAMIN B1: Vitamin B1 (Thiamine): 122.6 nmol/L (ref 66.5–200.0)

## 2021-03-24 NOTE — Progress Notes (Signed)
NURSING HOME LOCATION:  [facility] ROOM NUMBER:  [id number]  CODE STATUS:  DNR  PCP:  Hoy Register MD  This is a nursing facility follow up visit for Nursing Facility for SECOND readmission within 30 days.   Interim medical record and care since last SNF visit was updated with review of diagnostic studies and change in clinical status since last visit were documented.  HPI: Patient was hospitalized 6/10 - 03/16/2021  & 6/18-6/22/2022 On 6/10 he was admitted from the nursing facility with acute metabolic encephalopathy.   Sepsis was clinically present in the ED 6/10 with fever,lactic acid of 2.6, abnormal UA, leukocytosis & AKI .Creatinine was 1.28 (baseline 0.89-1.0) & sed rate 80. Cardizem drip initiated for AF w RVR w pulse rate up to 148. This was complicated by intermittent hypotension for which IV boli given.Broad spectrum antibiotics of Vanc,cefepime &Flagyl initiated in context of sacral wound & possible UTI after pan cultures.  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 Clinically the sacral wound was healthy in appearance & sepsis attriuted to UTI despite 6/10 C&S revealing < 10,000 colonies. Rocephin & metronidazole were to be continued until 6/24 post discharge back to the SNF 6/15.  He was sent back to the ED 6/18 as Code Stroke w non verbal state & possible facial droop. Neuro consultant , Dr Chipper Herb possible seizure & ordered Depakote loading. BP was initially 87/68 & pulse 124 ; EKG revealed AF w RVR again.Cardizem drip was initiated w subsequent transition to PO. Glucose was 143, BUN 29, creatinine 2.12, H/H 10.4/33.8.  COVID-19 screening was negative.  Urinalysis revealed greater than 50 red cells and greater than 50 white cells.No C&S collected. Head/neck CT angiography revealed no acute intercranial changes and no emergent large vessel occlusion.  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  Baseline creatinine was felt to be 1.3.  At discharge creatinine was 2.47. Stage IV sacral decubitus ulcer measuring 1 cm x 1 cm x 5.3 cm with tunneling was present.  Wound care was continued as IP.B12 level was normal at 347 (180-914).  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  Responses are monosyllabic.  He states that he is "better".  He does not relate some occasional "headache".  Staff has documented some edema at the PICC line site without cellulitis.  Physical exam:  Pertinent or positive findings: He was initially asleep without snoring or apnea.  He was arousable.  As noted responses tend to be monosyllabic.  Facies are blank.  Hair is disheveled and he is unshaven.  Heart sounds are distant but there is a tachyarrhythmia.  Breath sounds are decreased.  Foley catheter is in place.  Urine is dark.  Pedal pulses are palpable and a pulse deficit is present.  He has 1+ peripheral edema.  He is weak to opposition in all extremities. . General appearance: Adequately nourished; no acute distress, increased work of breathing is present.   Lymphatic: No lymphadenopathy about the head, neck, axilla. Eyes: No conjunctival inflammation or lid edema is present. There is no scleral icterus. Ears:  External ear exam shows no significant lesions or deformities.   Nose:  External nasal examination shows no deformity or inflammation. Nasal mucosa are pink and moist without lesions, exudates Oral exam:  Lips and gums are healthy appearing. There is no oropharyngeal erythema or exudate. Neck:  No thyromegaly, masses, tenderness noted.    Heart:  No murmur , rub. Chest: without wheezes, rhonchi, rales, rubs. Abdomen: Bowel sounds are normal. Abdomen is soft and nontender with no organomegaly, hernias, masses. GU:  Deferred  Extremities:  No cyanosis, clubbing  Neurologic exam :Balance, [place], finger to nose testing could not be completed due to clinical state Skin: Warm & dry w/o tenting. No significant rash.  See summary under each active problem in the Problem List with associated updated therapeutic plan

## 2021-03-24 NOTE — Assessment & Plan Note (Addendum)
Ceftriaxone 2 g IV via PICC and metronidazole 5 mg 3 times daily will be continued at the SNF  until 6/25. Clinically wound healthy.  Wound Care Nurse following @ SNF

## 2021-03-24 NOTE — Patient Instructions (Addendum)
See assessment and plan under each diagnosis in the problem list and acutely for this visit Total time 42 minutes; greater than 50% of the visit spent coordinating care for multi organ problems addressed at this encounter

## 2021-03-24 NOTE — Assessment & Plan Note (Addendum)
Rate is not well controlled; diltiazem will be increased to 90 mg every 8 hours. Monitor for recurrent hypotension for which he has received Midodrine.

## 2021-03-24 NOTE — Assessment & Plan Note (Addendum)
Ceftriaxone and metronidazole will be completed as of 6/25. Dr Orvan Falconer ID wanted PICC line left in until re-evaluated post antibiotic course.

## 2021-03-24 NOTE — Assessment & Plan Note (Addendum)
Foley in place.  Urine is dark and urinalysis will be ordered. He is completing Rocephin & Flagyl for sacral osteomyelitis. Tamsulosin is maintenance therapy.

## 2021-03-25 ENCOUNTER — Encounter (HOSPITAL_COMMUNITY): Payer: Self-pay

## 2021-03-25 ENCOUNTER — Other Ambulatory Visit: Payer: Self-pay

## 2021-03-25 ENCOUNTER — Emergency Department (HOSPITAL_COMMUNITY)
Admission: EM | Admit: 2021-03-25 | Discharge: 2021-03-26 | Disposition: A | Discharge status: Home / Self Care | Payer: Medicare Other | Attending: Emergency Medicine | Admitting: Emergency Medicine

## 2021-03-25 ENCOUNTER — Emergency Department (HOSPITAL_COMMUNITY): Payer: Medicare Other

## 2021-03-25 DIAGNOSIS — I130 Hypertensive heart and chronic kidney disease with heart failure and stage 1 through stage 4 chronic kidney disease, or unspecified chronic kidney disease: Secondary | ICD-10-CM | POA: Diagnosis not present

## 2021-03-25 DIAGNOSIS — Z7901 Long term (current) use of anticoagulants: Secondary | ICD-10-CM | POA: Diagnosis not present

## 2021-03-25 DIAGNOSIS — I4891 Unspecified atrial fibrillation: Secondary | ICD-10-CM | POA: Diagnosis not present

## 2021-03-25 DIAGNOSIS — D72829 Elevated white blood cell count, unspecified: Secondary | ICD-10-CM | POA: Insufficient documentation

## 2021-03-25 DIAGNOSIS — Z8616 Personal history of COVID-19: Secondary | ICD-10-CM | POA: Diagnosis not present

## 2021-03-25 DIAGNOSIS — T82898A Other specified complication of vascular prosthetic devices, implants and grafts, initial encounter: Secondary | ICD-10-CM | POA: Diagnosis not present

## 2021-03-25 DIAGNOSIS — Z452 Encounter for adjustment and management of vascular access device: Secondary | ICD-10-CM | POA: Diagnosis not present

## 2021-03-25 DIAGNOSIS — I5022 Chronic systolic (congestive) heart failure: Secondary | ICD-10-CM | POA: Insufficient documentation

## 2021-03-25 DIAGNOSIS — N183 Chronic kidney disease, stage 3 unspecified: Secondary | ICD-10-CM | POA: Insufficient documentation

## 2021-03-25 DIAGNOSIS — I4811 Longstanding persistent atrial fibrillation: Secondary | ICD-10-CM | POA: Diagnosis not present

## 2021-03-25 DIAGNOSIS — Y840 Cardiac catheterization as the cause of abnormal reaction of the patient, or of later complication, without mention of misadventure at the time of the procedure: Secondary | ICD-10-CM | POA: Insufficient documentation

## 2021-03-25 DIAGNOSIS — Z79899 Other long term (current) drug therapy: Secondary | ICD-10-CM | POA: Diagnosis not present

## 2021-03-25 DIAGNOSIS — R0902 Hypoxemia: Secondary | ICD-10-CM | POA: Diagnosis not present

## 2021-03-25 LAB — CBC WITH DIFFERENTIAL/PLATELET
Abs Immature Granulocytes: 0.07 10*3/uL (ref 0.00–0.07)
Basophils Absolute: 0.1 10*3/uL (ref 0.0–0.1)
Basophils Relative: 1 %
Eosinophils Absolute: 0.1 10*3/uL (ref 0.0–0.5)
Eosinophils Relative: 1 %
HCT: 35.7 % — ABNORMAL LOW (ref 39.0–52.0)
Hemoglobin: 10.7 g/dL — ABNORMAL LOW (ref 13.0–17.0)
Immature Granulocytes: 1 %
Lymphocytes Relative: 12 %
Lymphs Abs: 1.4 10*3/uL (ref 0.7–4.0)
MCH: 26.4 pg (ref 26.0–34.0)
MCHC: 30 g/dL (ref 30.0–36.0)
MCV: 88.1 fL (ref 80.0–100.0)
Monocytes Absolute: 0.6 10*3/uL (ref 0.1–1.0)
Monocytes Relative: 6 %
Neutro Abs: 8.8 10*3/uL — ABNORMAL HIGH (ref 1.7–7.7)
Neutrophils Relative %: 79 %
Platelets: 481 10*3/uL — ABNORMAL HIGH (ref 150–400)
RBC: 4.05 MIL/uL — ABNORMAL LOW (ref 4.22–5.81)
RDW: 18.8 % — ABNORMAL HIGH (ref 11.5–15.5)
WBC: 11 10*3/uL — ABNORMAL HIGH (ref 4.0–10.5)
nRBC: 0 % (ref 0.0–0.2)

## 2021-03-25 LAB — BASIC METABOLIC PANEL
Anion gap: 10 (ref 5–15)
BUN: 30 mg/dL — ABNORMAL HIGH (ref 8–23)
CO2: 24 mmol/L (ref 22–32)
Calcium: 8.6 mg/dL — ABNORMAL LOW (ref 8.9–10.3)
Chloride: 103 mmol/L (ref 98–111)
Creatinine, Ser: 2.29 mg/dL — ABNORMAL HIGH (ref 0.61–1.24)
GFR, Estimated: 30 mL/min — ABNORMAL LOW (ref >60)
Glucose, Bld: 151 mg/dL — ABNORMAL HIGH (ref 70–99)
Potassium: 4.2 mmol/L (ref 3.5–5.1)
Sodium: 137 mmol/L (ref 135–145)

## 2021-03-25 LAB — VALPROIC ACID LEVEL: Valproic Acid Lvl: 22 ug/mL — ABNORMAL LOW (ref 50.0–100.0)

## 2021-03-25 MED ORDER — ALTEPLASE 2 MG IJ SOLR
2.0000 mg | Freq: Once | INTRAMUSCULAR | Status: AC
  Administered 2021-03-25: 2 mg
  Filled 2021-03-25: qty 2

## 2021-03-25 MED ORDER — SODIUM CHLORIDE 0.9 % IV SOLN
2.0000 g | Freq: Once | INTRAVENOUS | Status: AC
  Administered 2021-03-25: 2 g via INTRAVENOUS
  Filled 2021-03-25: qty 20

## 2021-03-25 MED ORDER — DILTIAZEM HCL 90 MG PO TABS
90.0000 mg | ORAL_TABLET | Freq: Once | ORAL | Status: AC
  Administered 2021-03-25: 90 mg via ORAL
  Filled 2021-03-25: qty 1

## 2021-03-25 MED ORDER — DIVALPROEX SODIUM 125 MG PO CSDR
125.0000 mg | DELAYED_RELEASE_CAPSULE | Freq: Once | ORAL | Status: AC
  Administered 2021-03-25: 125 mg via ORAL
  Filled 2021-03-25: qty 1

## 2021-03-25 MED ORDER — METRONIDAZOLE 500 MG/100ML IV SOLN
500.0000 mg | Freq: Once | INTRAVENOUS | Status: AC
  Administered 2021-03-25: 500 mg via INTRAVENOUS
  Filled 2021-03-25: qty 100

## 2021-03-25 MED ORDER — DIVALPROEX SODIUM 125 MG PO CSDR: 250.0000 mg | DELAYED_RELEASE_CAPSULE | Freq: Two times a day (BID) | ORAL | Status: DC

## 2021-03-25 NOTE — ED Notes (Signed)
Returned phone call to [place] [phone] [phone] no answer

## 2021-03-25 NOTE — ED Notes (Signed)
Pt is comfortable. Denies any needs.

## 2021-03-25 NOTE — ED Provider Notes (Addendum)
[facility] EMERGENCY DEPARTMENT Provider Note   CSN: [id number] Arrival date & time: 03/25/21  3664    History Chief Complaint  Patient presents with   Vascular Access Problem    Jeffery Christian is a 70 y.o. male with past medical history significant for osteomyelitis, recent discharge for sepsis due to UTI currently on IV antibiotics who presents for evaluation of PICC line not flushing.  PICC line was placed for 6 weeks of antibiotics for his osteomyelitis.  Antibiotics completed after today's dose.  Per last ID note patient is supposed to be on ceftriaxone 2 g IV daily along with oral metronidazole 500 mg 3 times daily.  Apparently did not get his dose of Rocephin yesterday evening.  Patient has no complaints.  States he does hurt all over which is normal for him.  Denies any fever, vomiting, abdominal pain, worsening back pain, dysuria.   [facility], Seth Bake nurse>>>    Mentation today at baseline  Does not ambulate at baseline  MAR not sent with patient today  No AM Cardizem this morning  Poor rate control with his Afib. Increased Cardizem dose yesterday to 90 mg  Last PICC use yesterday afternoon. Could not flush line after.  No foley present, hx of incontinence  Apparently supposed to keep PICC until FU with ID outpatientTommy Medal saw while inpatient) >> Nursing unsure if FU date with ID   No documented history of dementia however nursing from facility note baseline cognitive deficits which they states have not changed from his baseline  Level 5 caveat applies    Admitted From: SNF  Disposition:  SNF    Recommendations for Outpatient Follow-up and new medication changes:  Follow up with Dr. Margarita Rana in 7 to 10 days. Continue antibiotic therapy with Ceftriaxone IV and oral metronidazole via PICC line with stop date 03/25/2021. Follow with ID clinic as scheduled. Follow up with ID, Dr Megan Salon before discontinuation of PICC line.      HPI      Past Medical History:  Diagnosis Date   Chronic kidney disease    Chronic systolic CHF (congestive heart failure) (HCC)    EF 35-40, diffuse HK, mild MR, moderate LAE, mild RAE, PASP 44, L pleural eff   Dilated cardiomyopathy (HCC)    likely related to tachycardia   Dysrhythmia    Persistent atrial fibrillation [facility])     Patient Active Problem List   Diagnosis Date Noted   Metabolic encephalopathy 40/34/7425   Sepsis ([place]) 03/11/2021   Prediabetes 03/11/2021   Asymmetric edema of both lower extremities 02/22/2021   Decubitus ulcer of buttock 01/26/2021   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   Normochromic normocytic anemia 12/14/2020   Urinary retention 11/18/2020   Neurocognitive deficits 11/18/2020   Acute renal failure superimposed on stage 3 chronic kidney disease ([place]) 95/63/8756   Acute metabolic encephalopathy 43/32/9518   Hypernatremia 11/09/2020   Elevated troponin 11/09/2020   COVID-19 virus infection 11/09/2020   Hypertension 01/07/2018   Atrial fibrillation with rapid ventricular response ([place]) 08/05/2017   CKD (chronic kidney disease), stage III ([place]) 08/05/2017   Persistent atrial fibrillation (HCC)    Chronic systolic CHF (congestive heart failure) ([place])    Dilated cardiomyopathy ([place])    Shortness of breath    Gout 07/18/2017   Left buttock abscess 03/22/2014    Past Surgical History:  Procedure Laterality Date   CARDIOVERSION N/A 08/20/2017   Procedure: CARDIOVERSION;  Surgeon: Jerline Pain, MD;  Location: [facility];  Service: Cardiovascular;  Laterality: N/A;   INCISION AND DRAINAGE ABSCESS Left 03/22/2014   Procedure: INCISION AND DRAINAGE ABSCESS LEFT BUTTOCK ABSCESS;  Surgeon: Zenovia Jarred, MD;  Location: [place];  Service: General;  Laterality: Left;       Family History  Problem Relation Age of Onset   Leukemia Father        8   Cancer Mother     Social History   Tobacco Use   Smoking status: Never   Smokeless tobacco:  Never  Vaping Use   Vaping Use: Never used  Substance Use Topics   Alcohol use: Not Currently    Comment: pt states he "does not drink a lot anymore"   Drug use: No    Home Medications Prior to Admission medications   Medication Sig Start Date End Date Taking? Authorizing Provider  allopurinol (ZYLOPRIM) 300 MG tablet Take 150 mg by mouth daily.    [provider]  Amino Acids-Protein Hydrolys (PRO-STAT PO) Take 30 mLs by mouth 2 (two) times daily.    [provider]  apixaban (ELIQUIS) 5 MG TABS tablet Take 1 tablet (5 mg total) by mouth 2 (two) times daily. 03/16/21 03/16/22  Thurnell Lose, MD  atorvastatin (LIPITOR) 20 MG tablet Take 1 tablet (20 mg total) by mouth every evening. 03/16/21 03/16/22  Thurnell Lose, MD  bisacodyl (DULCOLAX) 10 MG suppository Place 10 mg rectally See admin instructions. If not relieved by milk of mag, give $RemoveB'10mg'asbAnmUb$  bisocodyl suppository rectally for one dose in 24 hours as needed for constipation    [provider]  cefTRIAXone (ROCEPHIN) IVPB Inject 2 g into the vein daily for 10 days. Indication:  Sacral osteomyelitis First Dose: Yes Last Day of Therapy:  03/25/21 Labs - Once weekly:  CBC/D and BMP, Labs - Every other week:  ESR and CRP Method of administration: IV Push Method of administration may be changed at the discretion of home infusion pharmacist based upon assessment of the patient and/or caregiver's ability to self-administer the medication ordered. 03/16/21 03/26/21  Thurnell Lose, MD  colchicine 0.6 MG tablet Take 0.6 mg by mouth See admin instructions. 1.2 MG orally AT FIRST SIGN OF GOUT FLARE UP AND REPEAT 0.6 MG IN 1 HOUR IF SYMPTOMS PERSIST    [provider]  diltiazem (CARDIZEM) 60 MG tablet Take 60 mg by mouth every 8 (eight) hours. Hold for SBP < 100 and Heart rate < 60    [provider]  divalproex (DEPAKOTE SPRINKLE) 125 MG capsule Take 250 mg by mouth 2 (two) times daily.    [provider]  feeding supplement (ENSURE ENLIVE / ENSURE PLUS) LIQD Take 237 mLs by mouth 2 (two) times daily between meals. 11/16/20   Elgergawy, Silver Huguenin, MD  ferrous sulfate 325 (65 FE) MG tablet Take 325 mg by mouth 2 (two) times daily with a meal.    [provider]  Magnesium Hydroxide (MILK OF MAGNESIA PO) Take 30 mLs by mouth daily as needed (if no bowel movement in 3 days).    [provider]  metroNIDAZOLE (FLAGYL) 500 MG tablet Take 1 tablet (500 mg total) by mouth every 8 (eight) hours for 2 days. 03/23/21 03/25/21  Arrien, Jimmy Picket, MD  midodrine (PROAMATINE) 10 MG tablet Take 1 tablet (10 mg total) by mouth 3 (three) times daily with meals. 03/16/21   Thurnell Lose, MD  pantoprazole (PROTONIX) 40 MG tablet Take 1 tablet (40 mg total)  by mouth daily. 11/16/20   Elgergawy, Silver Huguenin, MD  polyethylene glycol (MIRALAX / GLYCOLAX) 17 g packet Take 17 g by mouth daily.    [provider]  Sodium Phosphates (RA SALINE ENEMA) 19-7 GM/118ML ENEM Place 118 mLs rectally See admin instructions. If not relieved by biscodyl suppository, give disposable saline enemal rectally for one dose in 24 hours as needed for constipation    [provider]  tamsulosin (FLOMAX) 0.4 MG CAPS capsule Take 1 capsule (0.4 mg total) by mouth daily after supper. 11/16/20   Elgergawy, Silver Huguenin, MD  traMADol (ULTRAM) 50 MG tablet Take 0.5 tablets (25 mg total) by mouth 2 (two) times daily. 03/23/21   Arrien, Jimmy Picket, MD  vitamin C (ASCORBIC ACID) 500 MG tablet Take 500 mg by mouth 2 (two) times daily.    [provider]    Allergies    Patient has no known allergies.  Review of Systems   Review of Systems  Constitutional: Negative.   HENT: Negative.    Respiratory: Negative.    Cardiovascular: Negative.   Gastrointestinal: Negative.   Genitourinary: Negative.   Musculoskeletal: Negative.   Skin: Negative.   Neurological: Negative.   All other systems  reviewed and are negative.  Physical Exam Updated Vital Signs BP (!) 129/109   Pulse (!) 116   Temp 99.3 F (37.4 C) (Oral)   Resp 16   Ht 6' (1.829 m)   Wt 85.5 kg   SpO2 100%   BMI 25.56 kg/m   Physical Exam Vitals and nursing note reviewed.  Constitutional:      General: He is not in acute distress.    Appearance: He is well-developed. He is not toxic-appearing or diaphoretic.     Comments: Chronically ill appearing  HENT:     Head: Normocephalic and atraumatic.     Nose: Nose normal.     Mouth/Throat:     Mouth: Mucous membranes are moist.  Eyes:     Pupils: Pupils are equal, round, and reactive to light.  Cardiovascular:     Rate and Rhythm: Tachycardia present. Rhythm irregular.     Pulses: Normal pulses.          Radial pulses are 2+ on the right side and 2+ on the left side.       Dorsalis pedis pulses are 2+ on the right side and 2+ on the left side.     Comments: Afib with RVR, HR 130's Pulmonary:     Effort: Pulmonary effort is normal. No respiratory distress.     Breath sounds: Normal breath sounds.  Abdominal:     General: Bowel sounds are normal. There is no distension.     Palpations: Abdomen is soft.     Tenderness: There is no abdominal tenderness. There is no guarding or rebound.  Musculoskeletal:        General: Normal range of motion.     Cervical back: Normal range of motion and neck supple.     Comments: PICC line to LUE. No surrounding erythema, warmth, edema  Skin:    General: Skin is warm and dry.     Capillary Refill: Capillary refill takes 2 to 3 seconds.     Comments: Sacral wound without purulent drainage.  Packing was removed and replaced.  No surrounding erythema or warmth.  1+ pitting edema to bilateral lower extremities.  No overlying erythema or warmth.  Neurological:     Mental Status: He is alert. Mental status  is at baseline.     Comments: At baseline per nursing staff at facility    ED Results / Procedures / Treatments    Labs (all labs ordered are listed, but only abnormal results are displayed) Labs Reviewed  CBC WITH DIFFERENTIAL/PLATELET - Abnormal; Notable for the following components:      Result Value   WBC 11.0 (*)    RBC 4.05 (*)    Hemoglobin 10.7 (*)    HCT 35.7 (*)    RDW 18.8 (*)    Platelets 481 (*)    Neutro Abs 8.8 (*)    All other components within normal limits  BASIC METABOLIC PANEL - Abnormal; Notable for the following components:   Glucose, Bld 151 (*)    BUN 30 (*)    Creatinine, Ser 2.29 (*)    Calcium 8.6 (*)    GFR, Estimated 30 (*)    All other components within normal limits  VALPROIC ACID LEVEL - Abnormal; Notable for the following components:   Valproic Acid Lvl 22 (*)    All other components within normal limits    EKG EKG Interpretation  Date/Time:  Friday March 25 2021 08:55:18 EDT Ventricular Rate:  121 PR Interval:    QRS Duration: 82 QT Interval:  388 QTC Calculation: 551 R Axis:   20 Text Interpretation: Atrial fibrillation Low voltage, extremity leads Prolonged QT interval Artifact in lead(s) I III aVL Confirmed by Sherwood Gambler [phone]) on 03/25/2021 10:36:48 AM  Radiology DG Chest Portable 1 View  Result Date: 03/25/2021 CLINICAL DATA:  Assess PICC EXAM: PORTABLE CHEST 1 VIEW COMPARISON:  03/12/2021 FINDINGS: Left PICC with tip at the SVC. Normal heart size for technique. Stable aortic contours. There is no edema, consolidation, effusion, or pneumothorax. Artifact from EKG leads. IMPRESSION: Unremarkable PICC positioning. Stable appearance of the chest compared to 03/12/2021. Electronically Signed   By: Monte Fantasia M.D.   On: 03/25/2021 11:23    Procedures Procedures   Medications Ordered in ED Medications  diltiazem (CARDIZEM) tablet 90 mg (90 mg Oral Given 03/25/21 0941)  cefTRIAXone (ROCEPHIN) 2 g in sodium chloride 0.9 % 100 mL IVPB (0 g Intravenous Stopped 03/25/21 1009)  metroNIDAZOLE (FLAGYL) IVPB 500 mg (0 mg Intravenous Stopped 03/25/21  1040)  alteplase (CATHFLO ACTIVASE) injection 2 mg (2 mg Intracatheter Given 03/25/21 1425)  divalproex (DEPAKOTE SPRINKLE) capsule 125 mg (125 mg Oral Given 03/25/21 1515)   ED Course  I have reviewed the triage vital signs and the nursing notes.  Pertinent labs & imaging results that were available during my care of the patient were reviewed by me and considered in my medical decision making (see chart for details).  70 year old here for evaluation of assessment of PICC line.  Currently on IV Rocephin and p.o. Flagyl for sacral osteomyelitis followed by ID.  Today is his last dose per chart review.  Patient at baseline mentation per nursing facility.  Patient is afebrile, nonseptic appearing.  Does appear chronically ill.  His sacral wound does not show any purulent discharge.  No surrounding erythema or warmth.  Does have some pitting edema to his lower extremities which appears to be at baseline.  He denies any chest pain or shortness of breath.  His abdomen is soft, nontender.  He has PICC line to his left upper extremity without surrounding erythema, warmth or swelling.  Not able to be flushed.  We will have PICC team assess this.  Plan on checking labs.  We will give patient  his IV antibiotics here.  Initially in A. fib with RVR, given his home PO Cardizem.  Has some mild tachycardia however no longer RVR after home medication. Persistent Afib which is chronic.  Patient without any hypotension.  Labs personally reviewed and interpreted:  CBC leukocytosis at 11.0, decreased from prior admission, Hgb 10.7 similar to previous BMP creatinine 2.29 similar to previous Depakote level 22  Given her prior hospitalization and to attempt to keep PICC line and to follow-up with ID will attempt to salvage IV. Will consult with IV team  Patient reassessed. PICC assessed by IV team. Waiting on TPA to flush line  IV  team at bedside. TPA needs to sit for 1 hour before attempting to flush.  CONSULT with  Dr. Gale Journey with ID states if PICC line will not flush to remove and note place another one. FU in office as previously directed.    Care transferred to Dr. Jenean Lindau who will FU on reassessment.   No concerns for active sepsis at this time. Afebrile, normotensive.  Plan if PICC flushes to dc back to facility.  Plan if PICC does not flush to have IV team pull PICC and dc back to facility.   Do not feel patient needs additional PICC placed at this time given IV abx are completed today per last hospital admit note. Abx given here in ED. ID in agreement.    Clinical Course as of 03/25/21 1539  Fri Mar 25, 2021  1521 H/o sacral osteomylitis. Has been on IV abx. PICC line was occluded so came in here. TPA dwell in PICC line. Last day of abx today. If PICC not working, IV team will pull. [ZB]    Clinical Course User Index [ZB] Pearson Grippe, DO   MDM Rules/Calculators/A&P                           Final Clinical Impression(s) / ED Diagnoses Final diagnoses:  Occlusion of peripherally inserted central catheter (PICC) line, initial encounter [facility])  Longstanding persistent atrial fibrillation [facility])    Rx / DC Orders ED Discharge Orders     None        Tryson Lumley A, PA-C 03/25/21 1535    Loukisha Gunnerson A, PA-C 03/25/21 1539    Sherwood Gambler, MD 03/28/21 [phone]

## 2021-03-25 NOTE — ED Notes (Signed)
Report given to [facility]

## 2021-03-25 NOTE — ED Notes (Signed)
Dinner tray ordered for pt

## 2021-03-25 NOTE — Discharge Instructions (Addendum)
We have spoken with the infectious disease doctors.  Patient's last day of antibiotics is today per the hospitalist. The infectious disease doctors do not want an additional PICC line placed.  We have given him his IV abx here in the ED  Will need the oral Flagyl at his nightly dose.  Follow-up with infectious disease as previously planned  Return for new or worsening symptoms

## 2021-03-25 NOTE — ED Notes (Signed)
Pt's PICC line working per IV team. Resident informed.

## 2021-03-25 NOTE — ED Notes (Signed)
Attempted report for [facility] at [phone]. Secretary unable to reach nurse for report. Provided call back number.

## 2021-03-25 NOTE — ED Notes (Signed)
PICC team at bedside

## 2021-03-25 NOTE — ED Notes (Signed)
Jeffery Christian [phone]

## 2021-03-25 NOTE — ED Notes (Signed)
[facility] was called 20th on list

## 2021-03-25 NOTE — ED Triage Notes (Signed)
Last night facility was attempting to flush and administer medication when they had no blood return and PICC line to the left arm was not able to be flushed

## 2021-03-26 ENCOUNTER — Encounter: Payer: Self-pay | Admitting: Internal Medicine

## 2021-03-26 DIAGNOSIS — Z7401 Bed confinement status: Secondary | ICD-10-CM | POA: Diagnosis not present

## 2021-03-26 DIAGNOSIS — M2550 Pain in unspecified joint: Secondary | ICD-10-CM | POA: Diagnosis not present

## 2021-03-26 DIAGNOSIS — G40909 Epilepsy, unspecified, not intractable, without status epilepticus: Secondary | ICD-10-CM | POA: Insufficient documentation

## 2021-03-26 NOTE — Assessment & Plan Note (Signed)
03/25/21 creat 2.29/GFR 30 CKD borderline Stage 3b/4 Medication List reviewed; on Allopurinol 150 mg qd for intermittent gout symptoms Decrease Allopurinol to 50 mg qod if CKD progresses

## 2021-03-26 NOTE — Assessment & Plan Note (Addendum)
Continue Depakote as maintenance w serum level monitor. Most current Valproic Level 22.

## 2021-03-28 DIAGNOSIS — L89314 Pressure ulcer of right buttock, stage 4: Secondary | ICD-10-CM | POA: Diagnosis not present

## 2021-03-28 DIAGNOSIS — N390 Urinary tract infection, site not specified: Secondary | ICD-10-CM | POA: Diagnosis not present

## 2021-03-29 DIAGNOSIS — M4628 Osteomyelitis of vertebra, sacral and sacrococcygeal region: Secondary | ICD-10-CM | POA: Diagnosis not present

## 2021-03-29 LAB — CBC AND DIFFERENTIAL
HCT: 31 — AB (ref 41–53)
Hemoglobin: 10.2 — AB (ref 13.5–17.5)
Platelets: 426 — AB (ref 150–399)
WBC: 8.9

## 2021-03-29 LAB — BASIC METABOLIC PANEL
BUN: 24 — AB (ref 4–21)
CO2: 26 — AB (ref 13–22)
Chloride: 107 (ref 99–108)
Creatinine: 1.4 — AB (ref 0.6–1.3)
Glucose: 226
Potassium: 4.8 (ref 3.4–5.3)
Sodium: 146 (ref 137–147)

## 2021-03-29 LAB — COMPREHENSIVE METABOLIC PANEL
Calcium: 8.9 (ref 8.7–10.7)
GFR calc Af Amer: 58.14
GFR calc non Af Amer: 50.17

## 2021-03-29 LAB — CBC: RBC: 3.74 — AB (ref 3.87–5.11)

## 2021-03-30 ENCOUNTER — Other Ambulatory Visit: Payer: Self-pay

## 2021-03-31 ENCOUNTER — Ambulatory Visit (INDEPENDENT_AMBULATORY_CARE_PROVIDER_SITE_OTHER): Payer: Medicare Other | Admitting: Internal Medicine

## 2021-03-31 ENCOUNTER — Encounter: Payer: Self-pay | Admitting: Internal Medicine

## 2021-03-31 ENCOUNTER — Other Ambulatory Visit: Payer: Self-pay

## 2021-03-31 DIAGNOSIS — R5381 Other malaise: Secondary | ICD-10-CM | POA: Diagnosis not present

## 2021-03-31 DIAGNOSIS — I499 Cardiac arrhythmia, unspecified: Secondary | ICD-10-CM | POA: Diagnosis not present

## 2021-03-31 DIAGNOSIS — G9341 Metabolic encephalopathy: Secondary | ICD-10-CM | POA: Diagnosis not present

## 2021-03-31 DIAGNOSIS — M4628 Osteomyelitis of vertebra, sacral and sacrococcygeal region: Secondary | ICD-10-CM | POA: Diagnosis not present

## 2021-03-31 DIAGNOSIS — Z7401 Bed confinement status: Secondary | ICD-10-CM | POA: Diagnosis not present

## 2021-03-31 DIAGNOSIS — R5383 Other fatigue: Secondary | ICD-10-CM | POA: Diagnosis not present

## 2021-03-31 DIAGNOSIS — R531 Weakness: Secondary | ICD-10-CM | POA: Diagnosis not present

## 2021-03-31 DIAGNOSIS — A419 Sepsis, unspecified organism: Secondary | ICD-10-CM

## 2021-03-31 NOTE — Assessment & Plan Note (Signed)
Sepsis has resolved ?

## 2021-03-31 NOTE — Assessment & Plan Note (Signed)
His recent acute encephalopathy has resolved.

## 2021-03-31 NOTE — Assessment & Plan Note (Signed)
He has completed 2 months of empiric antibiotic therapy for sacral osteomyelitis.  His PICC was removed today.  He can follow-up here as needed.

## 2021-03-31 NOTE — Progress Notes (Signed)
[facility]  Patient Active Problem List   Diagnosis Date Noted   Metabolic encephalopathy 03/19/2021    Priority: High   Sepsis (HCC) 03/11/2021    Priority: High   Decubitus ulcer of buttock 01/26/2021    Priority: High   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    Priority: High   Left buttock abscess 03/22/2014    Priority: High   Seizure disorder (HCC) 03/26/2021   Prediabetes 03/11/2021   Asymmetric edema of both lower extremities 02/22/2021   Normochromic normocytic anemia 12/14/2020   Urinary retention 11/18/2020   Neurocognitive deficits 11/18/2020   Acute renal failure superimposed on stage 3 chronic kidney disease (HCC) 11/09/2020   Acute metabolic encephalopathy 11/09/2020   Hypernatremia 11/09/2020   Elevated troponin 11/09/2020   COVID-19 virus infection 11/09/2020   Hypertension 01/07/2018   Atrial fibrillation with rapid ventricular response (HCC) 08/05/2017   CKD (chronic kidney disease), stage III (HCC) 08/05/2017   Persistent atrial fibrillation (HCC)    Chronic systolic CHF (congestive heart failure) (HCC)    Dilated cardiomyopathy (HCC)    Shortness of breath    Gout 07/18/2017    Patient's Medications  New Prescriptions   No medications on file  Previous Medications   ALLOPURINOL (ZYLOPRIM) 300 MG TABLET    Take 150 mg by mouth daily.   AMINO ACIDS-PROTEIN HYDROLYS (PRO-STAT PO)    Take 30 mLs by mouth 2 (two) times daily.   APIXABAN (ELIQUIS) 5 MG TABS TABLET    Take 1 tablet (5 mg total) by mouth 2 (two) times daily.   ATORVASTATIN (LIPITOR) 20 MG TABLET    Take 1 tablet (20 mg total) by mouth every evening.   BISACODYL (DULCOLAX) 10 MG SUPPOSITORY    Place 10 mg rectally See admin instructions. If not relieved by milk of mag, give 10mg  bisocodyl suppository rectally for one dose in 24 hours as needed for constipation   COLCHICINE 0.6 MG TABLET    Take 0.6 mg by mouth See admin instructions. 1.2 MG orally AT  FIRST SIGN OF GOUT FLARE UP AND REPEAT 0.6 MG IN 1 HOUR IF SYMPTOMS PERSIST   DILTIAZEM (CARDIZEM) 60 MG TABLET    Take 60 mg by mouth every 8 (eight) hours. Hold for SBP < 100 and Heart rate < 60   DIVALPROEX (DEPAKOTE SPRINKLE) 125 MG CAPSULE    Take 250 mg by mouth 2 (two) times daily.   FEEDING SUPPLEMENT (ENSURE ENLIVE / ENSURE PLUS) LIQD    Take 237 mLs by mouth 2 (two) times daily between meals.   FERROUS SULFATE 325 (65 FE) MG TABLET    Take 325 mg by mouth 2 (two) times daily with a meal.   MAGNESIUM HYDROXIDE (MILK OF MAGNESIA PO)    Take 30 mLs by mouth daily as needed (if no bowel movement in 3 days).   MIDODRINE (PROAMATINE) 10 MG TABLET    Take 1 tablet (10 mg total) by mouth 3 (three) times daily with meals.   PANTOPRAZOLE (PROTONIX) 40 MG TABLET    Take 1 tablet (40 mg total) by mouth daily.   POLYETHYLENE GLYCOL (MIRALAX / GLYCOLAX) 17 G PACKET    Take 17 g by mouth daily.   SODIUM PHOSPHATES (RA SALINE ENEMA) 19-7 GM/118ML ENEM    Place 118 mLs rectally See admin instructions. If not relieved by biscodyl suppository, give disposable saline enemal rectally for one dose in 24 hours as needed for constipation  TAMSULOSIN (FLOMAX) 0.4 MG CAPS CAPSULE    Take 1 capsule (0.4 mg total) by mouth daily after supper.   TRAMADOL (ULTRAM) 50 MG TABLET    Take 0.5 tablets (25 mg total) by mouth 2 (two) times daily.   VITAMIN C (ASCORBIC ACID) 500 MG TABLET    Take 500 mg by mouth 2 (two) times daily.  Modified Medications   No medications on file  Discontinued Medications   No medications on file    Subjective: Mr. Duris is in for his hospital follow-up visit.  Was hospitalized on 11/09/2020 after being found down at home and unresponsive.  He had acute encephalopathy, acute kidney injury, urinary retention and atrial fibrillation.  He was found to be positive for COVID-19 infection. His discharge summary did mention a sacral decubitus ulcer.    He was eventually stabilized and transferred  to [facility] on 11/17/2019.  The first note there mentions a left gluteal abscess that was debrided and treated with doxycycline.  Subsequent notes described the right-sided gluteal decubitus ulcer.  It appears he was put back on doxycycline 2 weeks ago after an MRI scan showed underlying osteomyelitis in the sacrococcygeal area.  Wound care note on 01/10/2021 showed some necrotic tissue but mentioned that the wound was "improving".  Mr. Oelkers confirms that he has been told that the wound is looking better.  I saw him for the first time on 01/27/2021 and started him on IV ceftriaxone and oral metronidazole.  He was readmitted to the hospital on 03/11/2021 with sepsis of uncertain etiology.  His wound was reported to look fairly clean.  He was concerned about a UTI but his urine culture was negative.  He was discharged on 03/16/2021 to continue ceftriaxone and metronidazole.  He was readmitted on 03/19/2021 with acute encephalopathy.  Cultures were again negative.  He completed antibiotic therapy on 03/25/2021.   Review of Systems: Review of Systems  Constitutional:  Negative for chills, diaphoresis and fever.   Past Medical History:  Diagnosis Date   Chronic kidney disease    Chronic systolic CHF (congestive heart failure) (HCC)    EF 35-40, diffuse HK, mild MR, moderate LAE, mild RAE, PASP 44, L pleural eff   Dilated cardiomyopathy (HCC)    likely related to tachycardia   Dysrhythmia    Persistent atrial fibrillation (HCC)     Social History   Tobacco Use   Smoking status: Never   Smokeless tobacco: Never  Vaping Use   Vaping Use: Never used  Substance Use Topics   Alcohol use: Not Currently    Comment: pt states he "does not drink a lot anymore"   Drug use: No    Family History  Problem Relation Age of Onset   Leukemia Father        42   Cancer Mother     No Known Allergies  Objective: Vitals:   03/31/21 0912  BP: 114/74  Pulse: 87  Temp: 98.5 F (36.9 C)   TempSrc: Oral  SpO2: 97%   There is no height or weight on file to calculate BMI.  Physical Exam Constitutional:      Comments: He is resting quietly on a stretcher.  He is alert and in no distress.  Cardiovascular:     Rate and Rhythm: Normal rate.  Pulmonary:     Effort: Pulmonary effort is normal.  Abdominal:     Palpations: Abdomen is soft.     Tenderness: There is no abdominal tenderness.  Skin:    Comments: Right buttock pressure sore is much smaller than when I saw him 2 months ago.  He has a little bit of yellow drainage on his dressing.  There is no surrounding erythema or fluctuance.  There is no odor.  Psychiatric:        Mood and Affect: Mood normal.     Lab Results    Problem List Items Addressed This Visit       High   Sacral osteomyelitis (HCC)    He has completed 2 months of empiric antibiotic therapy for sacral osteomyelitis.  His PICC was removed today.  He can follow-up here as needed.       Sepsis (HCC)    Sepsis has resolved.       Metabolic encephalopathy    His recent acute encephalopathy has resolved.         Cliffton Asters, MD [facility] [phone] pager   [phone] cell 03/31/2021, 9:27 AM

## 2021-03-31 NOTE — Progress Notes (Signed)
Per verbal order from Dr. Orvan Falconer, 44 cm PICC removed from left basilic, tip intact. No sutures present. No length documentation in chart.  Dressing was clean and dry. Petroleum dressing applied. Patient advised no heavy lifting with this arm and to leave dressing in place for 24 hours and not to shower affected arm for 24 hours. Advised patient to notify his nurse or seek emergency medical care if dressing becomes soaked with blood, swelling, or sharp pain presents. Advised patient to seek emergent care or notify his nurse if he develops neurological symptoms, chest pain, or shortness of breath. Patient verbalized understanding and agreement. RN answered patient's questions. Patient tolerated procedure well and EMT transport took patient to check out. RN notified Cassandra, nurse at [facility] of removal. Advised her to leave dressing in place 24 hours and avoid getting it wet.   Sandie Ano, RN

## 2021-04-05 DIAGNOSIS — I10 Essential (primary) hypertension: Secondary | ICD-10-CM | POA: Diagnosis not present

## 2021-04-05 LAB — BASIC METABOLIC PANEL
BUN: 13 (ref 4–21)
CO2: 25 — AB (ref 13–22)
Chloride: 102 (ref 99–108)
Creatinine: 1 (ref 0.6–1.3)
Glucose: 136
Potassium: 4 (ref 3.4–5.3)
Sodium: 139 (ref 137–147)

## 2021-04-05 LAB — COMPREHENSIVE METABOLIC PANEL
Calcium: 9 (ref 8.7–10.7)
GFR calc Af Amer: >90
GFR calc non Af Amer: 81.55

## 2021-04-05 LAB — CBC AND DIFFERENTIAL
HCT: 32 — AB (ref 41–53)
Hemoglobin: 10.7 — AB (ref 13.5–17.5)
Platelets: 359 (ref 150–399)
WBC: 6.6

## 2021-04-05 LAB — CBC: RBC: 3.95 (ref 3.87–5.11)

## 2021-04-11 DIAGNOSIS — L89314 Pressure ulcer of right buttock, stage 4: Secondary | ICD-10-CM | POA: Diagnosis not present

## 2021-04-13 ENCOUNTER — Other Ambulatory Visit: Payer: Self-pay | Admitting: Adult Health

## 2021-04-13 DIAGNOSIS — R279 Unspecified lack of coordination: Secondary | ICD-10-CM | POA: Diagnosis not present

## 2021-04-13 DIAGNOSIS — Z8701 Personal history of pneumonia (recurrent): Secondary | ICD-10-CM | POA: Diagnosis not present

## 2021-04-13 DIAGNOSIS — L89310 Pressure ulcer of right buttock, unstageable: Secondary | ICD-10-CM | POA: Diagnosis not present

## 2021-04-13 DIAGNOSIS — M6281 Muscle weakness (generalized): Secondary | ICD-10-CM | POA: Diagnosis not present

## 2021-04-13 DIAGNOSIS — R1311 Dysphagia, oral phase: Secondary | ICD-10-CM | POA: Diagnosis not present

## 2021-04-13 DIAGNOSIS — M8618 Other acute osteomyelitis, other site: Secondary | ICD-10-CM | POA: Diagnosis not present

## 2021-04-13 MED ORDER — TRAMADOL HCL 50 MG PO TABS: 25.0000 mg | ORAL_TABLET | Freq: Two times a day (BID) | ORAL | 0 refills | Status: DC

## 2021-04-14 ENCOUNTER — Encounter: Payer: Self-pay | Admitting: Adult Health

## 2021-04-14 ENCOUNTER — Non-Acute Institutional Stay (SKILLED_NURSING_FACILITY): Payer: Medicare Other | Admitting: Adult Health

## 2021-04-14 DIAGNOSIS — M4628 Osteomyelitis of vertebra, sacral and sacrococcygeal region: Secondary | ICD-10-CM | POA: Diagnosis not present

## 2021-04-14 DIAGNOSIS — R569 Unspecified convulsions: Secondary | ICD-10-CM | POA: Diagnosis not present

## 2021-04-14 DIAGNOSIS — I4819 Other persistent atrial fibrillation: Secondary | ICD-10-CM

## 2021-04-14 DIAGNOSIS — F0150 Vascular dementia without behavioral disturbance: Secondary | ICD-10-CM | POA: Diagnosis not present

## 2021-04-14 DIAGNOSIS — R1311 Dysphagia, oral phase: Secondary | ICD-10-CM | POA: Diagnosis not present

## 2021-04-14 DIAGNOSIS — L89314 Pressure ulcer of right buttock, stage 4: Secondary | ICD-10-CM | POA: Diagnosis not present

## 2021-04-14 DIAGNOSIS — M6281 Muscle weakness (generalized): Secondary | ICD-10-CM | POA: Diagnosis not present

## 2021-04-14 DIAGNOSIS — M1A09X0 Idiopathic chronic gout, multiple sites, without tophus (tophi): Secondary | ICD-10-CM

## 2021-04-14 DIAGNOSIS — R279 Unspecified lack of coordination: Secondary | ICD-10-CM | POA: Diagnosis not present

## 2021-04-14 DIAGNOSIS — M8618 Other acute osteomyelitis, other site: Secondary | ICD-10-CM | POA: Diagnosis not present

## 2021-04-14 DIAGNOSIS — Z8701 Personal history of pneumonia (recurrent): Secondary | ICD-10-CM | POA: Diagnosis not present

## 2021-04-14 DIAGNOSIS — L89310 Pressure ulcer of right buttock, unstageable: Secondary | ICD-10-CM | POA: Diagnosis not present

## 2021-04-14 NOTE — Progress Notes (Signed)
Location:  [facility] Room Number: 201 B Place of Service:  SNF (31) Provider:  Kenard Gower, DNP, FNP-BC  Patient Care Team: Hoy Register, MD as PCP - General (Family Medicine)  Extended Emergency Contact Information Primary Emergency Contact: Glendora Score [place] Home Phone: [phone] Relation: Daughter Secondary Emergency Contact: SAPP,RON Home Phone: [phone] Relation: Other  Code Status:  DNR  Goals of care: Advanced Directive information Advanced Directives 04/14/2021  Does Patient Have a Medical Advance Directive? Yes  Type of Advance Directive Out of facility DNR (pink MOST or yellow form)  Does patient want to make changes to medical advance directive? No - Patient declined  Would patient like information on creating a medical advance directive? -  Pre-existing out of facility DNR order (yellow form or pink MOST form) -     Chief Complaint  Patient presents with   Medical Management of Chronic Issues    Routine Visit    HPI:  Pt is a 70 y.o. male seen today for medical management of chronic diseases. He is a long-term care resident of [facility]. He has a PMH of chronic atrial fibrillation, chronic kidney disease stage III, hypertension, prediabetes, gout and sacral osteomyelitis. He participates in restorative exercises such as BLE PROM. Right buttock pressure ulcer stage IV measures 1.2 X1 X 2 cm, with moderate serous drainage. He has completed antibiotics. PICC line has been discontinued.    Past Medical History:  Diagnosis Date   Chronic kidney disease    Chronic systolic CHF (congestive heart failure) (HCC)    EF 35-40, diffuse HK, mild MR, moderate LAE, mild RAE, PASP 44, L pleural eff   Dilated cardiomyopathy (HCC)    likely related to tachycardia   Dysrhythmia    Persistent atrial fibrillation [facility])    Past Surgical History:  Procedure Laterality Date   CARDIOVERSION  N/A 08/20/2017   Procedure: CARDIOVERSION;  Surgeon: [name], MD;  [facility] END[facility];  Service: Cardiovascular;  Laterality: N/A;   INCISION AND DRAINAGE ABSCESS Left 03/22/2014   Procedure: INCISION AND DRAINAGE ABSCESS LEFT BUTTOCK ABSCESS;  Surgeon: [name], MD;  Location: [facility] OR;  Service: General;  Laterality: Left;    No Known Allergies  Outpatient Encounter Medications as of 04/14/2021  Medication Sig   allopurinol (ZYLOPRIM) 300 MG tablet Take 150 mg by mouth daily.   Amino Acids-Protein Hydrolys (PRO-STAT PO) Take 30 mLs by mouth 2 (two) times daily.   apixaban (ELIQUIS) 5 MG TABS tablet Take 1 tablet (5 mg total) by mouth 2 (two) times daily.   atorvastatin (LIPITOR) 20 MG tablet Take 1 tablet (20 mg total) by mouth every evening.   colchicine 0.6 MG tablet Take 0.6 mg by mouth See admin instructions. 1.2 MG orally AT FIRST SIGN OF GOUT FLARE UP AND REPEAT 0.6 MG IN 1 HOUR IF SYMPTOMS PERSIST   diltiazem (CARDIZEM) 90 MG tablet Take 90 mg by mouth every 8 (eight) hours. Hold for SBP < 100 and Heart rate < 60   divalproex (DEPAKOTE SPRINKLE) 125 MG capsule Take 250 mg by mouth 2 (two) times daily.   feeding supplement (ENSURE ENLIVE / ENSURE PLUS) LIQD Take 237 mLs by mouth 2 (two) times daily between meals.   ferrous sulfate 325 (65 FE) MG tablet Take 325 mg by mouth 2 (two) times daily with a meal.   midodrine (PROAMATINE) 10 MG tablet Take 1 tablet (10 mg total) by mouth 3 (three)  times daily with meals.   pantoprazole (PROTONIX) 40 MG tablet Take 1 tablet (40 mg total) by mouth daily.   polyethylene glycol (MIRALAX / GLYCOLAX) 17 g packet Take 17 g by mouth daily.   tamsulosin (FLOMAX) 0.4 MG CAPS capsule Take 1 capsule (0.4 mg total) by mouth daily after supper.   traMADol (ULTRAM) 50 MG tablet Take 0.5 tablets (25 mg total) by mouth 2 (two) times daily.   vitamin C (ASCORBIC ACID) 500 MG tablet Take 500 mg by mouth 2 (two) times daily.   bisacodyl (DULCOLAX)  10 MG suppository Place 10 mg rectally See admin instructions. If not relieved by milk of mag, give 10mg  bisocodyl suppository rectally for one dose in 24 hours as needed for constipation   [DISCONTINUED] Magnesium Hydroxide (MILK OF MAGNESIA PO) Take 30 mLs by mouth daily as needed (if no bowel movement in 3 days).   [DISCONTINUED] Sodium Phosphates (RA SALINE ENEMA) 19-7 GM/118ML ENEM Place 118 mLs rectally See admin instructions. If not relieved by biscodyl suppository, give disposable saline enemal rectally for one dose in 24 hours as needed for constipation   No facility-administered encounter medications on file as of 04/14/2021.    Review of Systems  Unable to obtain due to dementia   Immunization History  Administered Date(s) Administered   DTaP 12/14/2020   Pneumococcal Conjugate-13 10/28/2018   Pneumococcal Polysaccharide-23 09/08/2020   Tdap 12/14/2020   Pertinent  Health Maintenance Due  Topic Date Due   COLONOSCOPY (Pts 45-2yrs Insurance coverage will need to be confirmed)  Never done   INFLUENZA VACCINE  05/02/2021   PNA vac Low Risk Adult  Completed   Fall Risk  03/31/2021 01/27/2021 09/08/2020 06/08/2020 10/28/2018  Falls in the past year? 0 1 0 0 0  Number falls in past yr: - 0 0 - -  Injury with Fall? - 1 0 - -  Risk for fall due to : Impaired balance/gait;Impaired mobility History of fall(s);Impaired balance/gait;Impaired mobility - No Fall Risks -  Follow up Falls evaluation completed Falls evaluation completed - - -     Vitals:   04/14/21 1553  BP: 113/73  Pulse: 91  Resp: 19  Temp: (!) 97.4 F (36.3 C)  Weight: 176 lb (79.8 kg)  Height: 6' (1.829 m)   Body mass index is 23.87 kg/m.  Physical Exam  GENERAL APPEARANCE: Well nourished. In no acute distress. Normal body habitus SKIN:  Right buttock pressure ulcer stage IV MOUTH and THROAT: Lips are without lesions. Oral mucosa is moist and without lesions. T RESPIRATORY: Breathing is even & unlabored, BS  CTAB CARDIAC: Irregular heart rhthym, no murmur,no extra heart sounds, BLE 2+edema GI: Abdomen soft, normal BS, no masses, no tenderness NEUROLOGICAL: There is no tremor. Speech is clear. Alert to self, disoriented to time and place. PSYCHIATRIC:  Affect and behavior are appropriate  Labs reviewed: Recent Labs    11/10/20 1508 11/10/20 2000 03/15/21 0348 03/16/21 0320 03/19/21 2115 03/20/21 0429 03/21/21 0501 03/22/21 0445 03/23/21 0500 03/25/21 0908  NA 167*   < > 140 143   < > 142   < > 140 139 137  K 4.1   < > 3.6 3.9   < > 4.7   < > 4.5 4.4 4.2  CL >130*   < > 106 106   < > 108   < > 102 104 103  CO2 20*   < > 26 28   < > 26   < > 28  27 24  GLUCOSE 152*   < > 105* 102*   < > 109*   < > 114* 127* 151*  BUN 85*   < > 23 22   < > 30*   < > 33* 33* 30*  CREATININE 2.58*   < > 1.16 1.30*   < > 2.16*   < > 2.52* 2.47* 2.29*  CALCIUM 8.5*   < > 8.5* 8.7*   < > 8.7*   < > 8.6* 8.5* 8.6*  MG  --    < > 1.9 1.9  --  2.1  --   --   --   --   PHOS 3.5  --   --   --   --  4.9*  --   --   --   --    < > = values in this interval not displayed.   Recent Labs    03/16/21 0320 03/19/21 2115 03/20/21 0429  AST 13* 11* 10*  ALT 9 10 8   ALKPHOS 41 44 44  BILITOT 0.5 0.4 0.7  PROT 5.0* 5.4* 5.6*  ALBUMIN 2.0* 2.0* 2.0*   Recent Labs    03/22/21 0445 03/23/21 0500 03/25/21 0908  WBC 13.4* 13.7* 11.0*  NEUTROABS 10.8* 11.0* 8.8*  HGB 9.6* 9.9* 10.7*  HCT 30.3* 31.4* 35.7*  MCV 84.4 84.2 88.1  PLT 440* 464* 481*   Lab Results  Component Value Date   TSH 5.052 (H) 03/21/2021   Lab Results  Component Value Date   HGBA1C 5.3 03/11/2021   Lab Results  Component Value Date   CHOL 95 03/07/2021   HDL 24 (A) 03/07/2021   LDLCALC 44 03/07/2021   TRIG 137 03/07/2021   CHOLHDL 3.6 10/28/2018    Significant Diagnostic Results in last 30 days:  DG Chest Portable 1 View  Result Date: 03/25/2021 CLINICAL DATA:  Assess PICC EXAM: PORTABLE CHEST 1 VIEW COMPARISON:  03/12/2021  FINDINGS: Left PICC with tip at the SVC. Normal heart size for technique. Stable aortic contours. There is no edema, consolidation, effusion, or pneumothorax. Artifact from EKG leads. IMPRESSION: Unremarkable PICC positioning. Stable appearance of the chest compared to 03/12/2021. Electronically Signed   By: 05/12/2021 M.D.   On: 03/25/2021 11:23    Assessment/Plan  1. Persistent atrial fibrillation (HCC)-   -    rate-controlled, continue Eliquis for anticoagulation and diltiazem for rate-control  2. Seizure-like activity (HCC) -  no seizure-like episodes -   continue Depakote  3. Idiopathic chronic gout of multiple sites without tophus -  no gout flares, continue allopurinol  4. Pressure injury of right buttock, stage 4 (HCC) -  wound treatment with calcium alginate daily -  continue supplementation with pro-stat , Ensure,  vitamin C and magic cup  5. Sacral osteomyelitis (HCC) -  completed antibiotics, resolved.  6.Vascular dementia without behavioral disturbance (HCC) -   MRI of head done on 03/20/21 showed numerous chronic microhemorrhages in a mixed central and peripheral distribution. Generalized volume loss without a clear lobar predilection -  BIMS score 7/15, ranging in severe cognitive impairment -  continue supportive care    Family/ staff Communication:   Discussed plan of care with charge nurse.  Labs/tests ordered:   None  Goals of care:   Long-term care   8/15, DNP, MSN, FNP-BC [facility] [phone] (Monday-Friday 8:00 a.m. - 5:00 p.m.) [phone] (after hours)

## 2021-04-15 DIAGNOSIS — Z8701 Personal history of pneumonia (recurrent): Secondary | ICD-10-CM | POA: Diagnosis not present

## 2021-04-15 DIAGNOSIS — R1311 Dysphagia, oral phase: Secondary | ICD-10-CM | POA: Diagnosis not present

## 2021-04-15 DIAGNOSIS — R279 Unspecified lack of coordination: Secondary | ICD-10-CM | POA: Diagnosis not present

## 2021-04-15 DIAGNOSIS — M8618 Other acute osteomyelitis, other site: Secondary | ICD-10-CM | POA: Diagnosis not present

## 2021-04-15 DIAGNOSIS — M6281 Muscle weakness (generalized): Secondary | ICD-10-CM | POA: Diagnosis not present

## 2021-04-15 DIAGNOSIS — L89310 Pressure ulcer of right buttock, unstageable: Secondary | ICD-10-CM | POA: Diagnosis not present

## 2021-04-18 DIAGNOSIS — Z8701 Personal history of pneumonia (recurrent): Secondary | ICD-10-CM | POA: Diagnosis not present

## 2021-04-18 DIAGNOSIS — R1311 Dysphagia, oral phase: Secondary | ICD-10-CM | POA: Diagnosis not present

## 2021-04-18 DIAGNOSIS — M8618 Other acute osteomyelitis, other site: Secondary | ICD-10-CM | POA: Diagnosis not present

## 2021-04-18 DIAGNOSIS — M6281 Muscle weakness (generalized): Secondary | ICD-10-CM | POA: Diagnosis not present

## 2021-04-18 DIAGNOSIS — R279 Unspecified lack of coordination: Secondary | ICD-10-CM | POA: Diagnosis not present

## 2021-04-18 DIAGNOSIS — L89310 Pressure ulcer of right buttock, unstageable: Secondary | ICD-10-CM | POA: Diagnosis not present

## 2021-04-18 DIAGNOSIS — L89314 Pressure ulcer of right buttock, stage 4: Secondary | ICD-10-CM | POA: Diagnosis not present

## 2021-04-19 DIAGNOSIS — M8618 Other acute osteomyelitis, other site: Secondary | ICD-10-CM | POA: Diagnosis not present

## 2021-04-19 DIAGNOSIS — R279 Unspecified lack of coordination: Secondary | ICD-10-CM | POA: Diagnosis not present

## 2021-04-19 DIAGNOSIS — L89310 Pressure ulcer of right buttock, unstageable: Secondary | ICD-10-CM | POA: Diagnosis not present

## 2021-04-19 DIAGNOSIS — Z8701 Personal history of pneumonia (recurrent): Secondary | ICD-10-CM | POA: Diagnosis not present

## 2021-04-19 DIAGNOSIS — R1311 Dysphagia, oral phase: Secondary | ICD-10-CM | POA: Diagnosis not present

## 2021-04-19 DIAGNOSIS — M6281 Muscle weakness (generalized): Secondary | ICD-10-CM | POA: Diagnosis not present

## 2021-04-20 DIAGNOSIS — R1311 Dysphagia, oral phase: Secondary | ICD-10-CM | POA: Diagnosis not present

## 2021-04-20 DIAGNOSIS — R279 Unspecified lack of coordination: Secondary | ICD-10-CM | POA: Diagnosis not present

## 2021-04-20 DIAGNOSIS — M8618 Other acute osteomyelitis, other site: Secondary | ICD-10-CM | POA: Diagnosis not present

## 2021-04-20 DIAGNOSIS — Z8701 Personal history of pneumonia (recurrent): Secondary | ICD-10-CM | POA: Diagnosis not present

## 2021-04-20 DIAGNOSIS — M6281 Muscle weakness (generalized): Secondary | ICD-10-CM | POA: Diagnosis not present

## 2021-04-20 DIAGNOSIS — L89310 Pressure ulcer of right buttock, unstageable: Secondary | ICD-10-CM | POA: Diagnosis not present

## 2021-04-21 DIAGNOSIS — M6281 Muscle weakness (generalized): Secondary | ICD-10-CM | POA: Diagnosis not present

## 2021-04-21 DIAGNOSIS — L89310 Pressure ulcer of right buttock, unstageable: Secondary | ICD-10-CM | POA: Diagnosis not present

## 2021-04-21 DIAGNOSIS — M8618 Other acute osteomyelitis, other site: Secondary | ICD-10-CM | POA: Diagnosis not present

## 2021-04-21 DIAGNOSIS — R279 Unspecified lack of coordination: Secondary | ICD-10-CM | POA: Diagnosis not present

## 2021-04-21 DIAGNOSIS — Z8701 Personal history of pneumonia (recurrent): Secondary | ICD-10-CM | POA: Diagnosis not present

## 2021-04-21 DIAGNOSIS — R1311 Dysphagia, oral phase: Secondary | ICD-10-CM | POA: Diagnosis not present

## 2021-04-22 DIAGNOSIS — R279 Unspecified lack of coordination: Secondary | ICD-10-CM | POA: Diagnosis not present

## 2021-04-22 DIAGNOSIS — L89310 Pressure ulcer of right buttock, unstageable: Secondary | ICD-10-CM | POA: Diagnosis not present

## 2021-04-22 DIAGNOSIS — M8618 Other acute osteomyelitis, other site: Secondary | ICD-10-CM | POA: Diagnosis not present

## 2021-04-22 DIAGNOSIS — R1311 Dysphagia, oral phase: Secondary | ICD-10-CM | POA: Diagnosis not present

## 2021-04-22 DIAGNOSIS — M6281 Muscle weakness (generalized): Secondary | ICD-10-CM | POA: Diagnosis not present

## 2021-04-22 DIAGNOSIS — Z8701 Personal history of pneumonia (recurrent): Secondary | ICD-10-CM | POA: Diagnosis not present

## 2021-04-25 DIAGNOSIS — R279 Unspecified lack of coordination: Secondary | ICD-10-CM | POA: Diagnosis not present

## 2021-04-25 DIAGNOSIS — M8618 Other acute osteomyelitis, other site: Secondary | ICD-10-CM | POA: Diagnosis not present

## 2021-04-25 DIAGNOSIS — L89314 Pressure ulcer of right buttock, stage 4: Secondary | ICD-10-CM | POA: Diagnosis not present

## 2021-04-25 DIAGNOSIS — Z8701 Personal history of pneumonia (recurrent): Secondary | ICD-10-CM | POA: Diagnosis not present

## 2021-04-25 DIAGNOSIS — M6281 Muscle weakness (generalized): Secondary | ICD-10-CM | POA: Diagnosis not present

## 2021-04-25 DIAGNOSIS — L89310 Pressure ulcer of right buttock, unstageable: Secondary | ICD-10-CM | POA: Diagnosis not present

## 2021-04-25 DIAGNOSIS — R1311 Dysphagia, oral phase: Secondary | ICD-10-CM | POA: Diagnosis not present

## 2021-04-26 DIAGNOSIS — M6281 Muscle weakness (generalized): Secondary | ICD-10-CM | POA: Diagnosis not present

## 2021-04-26 DIAGNOSIS — L89310 Pressure ulcer of right buttock, unstageable: Secondary | ICD-10-CM | POA: Diagnosis not present

## 2021-04-26 DIAGNOSIS — R279 Unspecified lack of coordination: Secondary | ICD-10-CM | POA: Diagnosis not present

## 2021-04-26 DIAGNOSIS — R1311 Dysphagia, oral phase: Secondary | ICD-10-CM | POA: Diagnosis not present

## 2021-04-26 DIAGNOSIS — Z8701 Personal history of pneumonia (recurrent): Secondary | ICD-10-CM | POA: Diagnosis not present

## 2021-04-26 DIAGNOSIS — M8618 Other acute osteomyelitis, other site: Secondary | ICD-10-CM | POA: Diagnosis not present

## 2021-04-27 DIAGNOSIS — M8618 Other acute osteomyelitis, other site: Secondary | ICD-10-CM | POA: Diagnosis not present

## 2021-04-27 DIAGNOSIS — R279 Unspecified lack of coordination: Secondary | ICD-10-CM | POA: Diagnosis not present

## 2021-04-27 DIAGNOSIS — R1311 Dysphagia, oral phase: Secondary | ICD-10-CM | POA: Diagnosis not present

## 2021-04-27 DIAGNOSIS — L89310 Pressure ulcer of right buttock, unstageable: Secondary | ICD-10-CM | POA: Diagnosis not present

## 2021-04-27 DIAGNOSIS — Z8701 Personal history of pneumonia (recurrent): Secondary | ICD-10-CM | POA: Diagnosis not present

## 2021-04-27 DIAGNOSIS — M6281 Muscle weakness (generalized): Secondary | ICD-10-CM | POA: Diagnosis not present

## 2021-04-28 DIAGNOSIS — L89310 Pressure ulcer of right buttock, unstageable: Secondary | ICD-10-CM | POA: Diagnosis not present

## 2021-04-28 DIAGNOSIS — M8618 Other acute osteomyelitis, other site: Secondary | ICD-10-CM | POA: Diagnosis not present

## 2021-04-28 DIAGNOSIS — M6281 Muscle weakness (generalized): Secondary | ICD-10-CM | POA: Diagnosis not present

## 2021-04-28 DIAGNOSIS — R1311 Dysphagia, oral phase: Secondary | ICD-10-CM | POA: Diagnosis not present

## 2021-04-28 DIAGNOSIS — Z8701 Personal history of pneumonia (recurrent): Secondary | ICD-10-CM | POA: Diagnosis not present

## 2021-04-28 DIAGNOSIS — R279 Unspecified lack of coordination: Secondary | ICD-10-CM | POA: Diagnosis not present

## 2021-04-29 DIAGNOSIS — M8618 Other acute osteomyelitis, other site: Secondary | ICD-10-CM | POA: Diagnosis not present

## 2021-04-29 DIAGNOSIS — M6281 Muscle weakness (generalized): Secondary | ICD-10-CM | POA: Diagnosis not present

## 2021-04-29 DIAGNOSIS — R1311 Dysphagia, oral phase: Secondary | ICD-10-CM | POA: Diagnosis not present

## 2021-04-29 DIAGNOSIS — R279 Unspecified lack of coordination: Secondary | ICD-10-CM | POA: Diagnosis not present

## 2021-04-29 DIAGNOSIS — Z8701 Personal history of pneumonia (recurrent): Secondary | ICD-10-CM | POA: Diagnosis not present

## 2021-04-29 DIAGNOSIS — L89310 Pressure ulcer of right buttock, unstageable: Secondary | ICD-10-CM | POA: Diagnosis not present

## 2021-05-02 DIAGNOSIS — L89310 Pressure ulcer of right buttock, unstageable: Secondary | ICD-10-CM | POA: Diagnosis not present

## 2021-05-02 DIAGNOSIS — M8618 Other acute osteomyelitis, other site: Secondary | ICD-10-CM | POA: Diagnosis not present

## 2021-05-02 DIAGNOSIS — L89314 Pressure ulcer of right buttock, stage 4: Secondary | ICD-10-CM | POA: Diagnosis not present

## 2021-05-02 DIAGNOSIS — Z8701 Personal history of pneumonia (recurrent): Secondary | ICD-10-CM | POA: Diagnosis not present

## 2021-05-02 DIAGNOSIS — M6281 Muscle weakness (generalized): Secondary | ICD-10-CM | POA: Diagnosis not present

## 2021-05-02 DIAGNOSIS — R1311 Dysphagia, oral phase: Secondary | ICD-10-CM | POA: Diagnosis not present

## 2021-05-02 DIAGNOSIS — R279 Unspecified lack of coordination: Secondary | ICD-10-CM | POA: Diagnosis not present

## 2021-05-03 DIAGNOSIS — Z8701 Personal history of pneumonia (recurrent): Secondary | ICD-10-CM | POA: Diagnosis not present

## 2021-05-03 DIAGNOSIS — M8618 Other acute osteomyelitis, other site: Secondary | ICD-10-CM | POA: Diagnosis not present

## 2021-05-03 DIAGNOSIS — R279 Unspecified lack of coordination: Secondary | ICD-10-CM | POA: Diagnosis not present

## 2021-05-03 DIAGNOSIS — M6281 Muscle weakness (generalized): Secondary | ICD-10-CM | POA: Diagnosis not present

## 2021-05-03 DIAGNOSIS — R1311 Dysphagia, oral phase: Secondary | ICD-10-CM | POA: Diagnosis not present

## 2021-05-03 DIAGNOSIS — L89310 Pressure ulcer of right buttock, unstageable: Secondary | ICD-10-CM | POA: Diagnosis not present

## 2021-05-04 DIAGNOSIS — Z8701 Personal history of pneumonia (recurrent): Secondary | ICD-10-CM | POA: Diagnosis not present

## 2021-05-04 DIAGNOSIS — L89310 Pressure ulcer of right buttock, unstageable: Secondary | ICD-10-CM | POA: Diagnosis not present

## 2021-05-04 DIAGNOSIS — M6281 Muscle weakness (generalized): Secondary | ICD-10-CM | POA: Diagnosis not present

## 2021-05-04 DIAGNOSIS — R1311 Dysphagia, oral phase: Secondary | ICD-10-CM | POA: Diagnosis not present

## 2021-05-04 DIAGNOSIS — M8618 Other acute osteomyelitis, other site: Secondary | ICD-10-CM | POA: Diagnosis not present

## 2021-05-04 DIAGNOSIS — R279 Unspecified lack of coordination: Secondary | ICD-10-CM | POA: Diagnosis not present

## 2021-05-05 DIAGNOSIS — L89310 Pressure ulcer of right buttock, unstageable: Secondary | ICD-10-CM | POA: Diagnosis not present

## 2021-05-05 DIAGNOSIS — M8618 Other acute osteomyelitis, other site: Secondary | ICD-10-CM | POA: Diagnosis not present

## 2021-05-05 DIAGNOSIS — Z8701 Personal history of pneumonia (recurrent): Secondary | ICD-10-CM | POA: Diagnosis not present

## 2021-05-05 DIAGNOSIS — R1311 Dysphagia, oral phase: Secondary | ICD-10-CM | POA: Diagnosis not present

## 2021-05-05 DIAGNOSIS — R279 Unspecified lack of coordination: Secondary | ICD-10-CM | POA: Diagnosis not present

## 2021-05-05 DIAGNOSIS — M6281 Muscle weakness (generalized): Secondary | ICD-10-CM | POA: Diagnosis not present

## 2021-05-06 ENCOUNTER — Encounter: Payer: Self-pay | Admitting: Adult Health

## 2021-05-06 ENCOUNTER — Non-Acute Institutional Stay (SKILLED_NURSING_FACILITY): Payer: Medicare Other | Admitting: Adult Health

## 2021-05-06 DIAGNOSIS — R569 Unspecified convulsions: Secondary | ICD-10-CM | POA: Diagnosis not present

## 2021-05-06 DIAGNOSIS — L89310 Pressure ulcer of right buttock, unstageable: Secondary | ICD-10-CM | POA: Diagnosis not present

## 2021-05-06 DIAGNOSIS — M8618 Other acute osteomyelitis, other site: Secondary | ICD-10-CM | POA: Diagnosis not present

## 2021-05-06 DIAGNOSIS — I4819 Other persistent atrial fibrillation: Secondary | ICD-10-CM | POA: Diagnosis not present

## 2021-05-06 DIAGNOSIS — M1A09X0 Idiopathic chronic gout, multiple sites, without tophus (tophi): Secondary | ICD-10-CM | POA: Diagnosis not present

## 2021-05-06 DIAGNOSIS — L89314 Pressure ulcer of right buttock, stage 4: Secondary | ICD-10-CM | POA: Diagnosis not present

## 2021-05-06 DIAGNOSIS — R279 Unspecified lack of coordination: Secondary | ICD-10-CM | POA: Diagnosis not present

## 2021-05-06 DIAGNOSIS — Z8701 Personal history of pneumonia (recurrent): Secondary | ICD-10-CM | POA: Diagnosis not present

## 2021-05-06 DIAGNOSIS — R1311 Dysphagia, oral phase: Secondary | ICD-10-CM | POA: Diagnosis not present

## 2021-05-06 DIAGNOSIS — F0150 Vascular dementia without behavioral disturbance: Secondary | ICD-10-CM

## 2021-05-06 DIAGNOSIS — M6281 Muscle weakness (generalized): Secondary | ICD-10-CM | POA: Diagnosis not present

## 2021-05-06 NOTE — Progress Notes (Signed)
Location:  [facility] Room Number: 106 A Place of Service:  SNF (31) Provider:  Kenard Gower, DNP, FNP-BC  Patient Care Team: Hoy Register, MD as PCP - General (Family Medicine) Regan Lemming, MD as PCP - Electrophysiology (Cardiology)  Extended Emergency Contact Information Primary Emergency Contact: Glendora Score [place] Home Phone: [phone] Relation: Daughter Secondary Emergency Contact: SAPP,RON Home Phone: [phone] Relation: Other  Code Status:  DNR  Goals of care: Advanced Directive information Advanced Directives 05/18/2021  Does Patient Have a Medical Advance Directive? Yes  Type of Advance Directive Out of facility DNR (pink MOST or yellow form)  Does patient want to make changes to medical advance directive? -  Would patient like information on creating a medical advance directive? -  Pre-existing out of facility DNR order (yellow form or pink MOST form) -     Chief Complaint  Patient presents with   Medical Management of Chronic Issues    Routine Visit    HPI:  Pt is a 70 y.o. male seen today for medical management of chronic diseases. He is a long-term care resident of [facility]. He has a PMH of chronic atrial fibrillation, chronic kidney disease stage III, hypertension, prediabetes, gout and sacral osteomyelitis. When asked what month and year it is, he answered,"Who cares?" Right buttock pressure ulcer stage IV is improving, measuring  0.5 X 1 X 2 cm. Daily dressing with calcium alginate and foam dressing being done. No reported seizure-like episode. He takes Depakote DR 125 mg 2 capsules = 250 mg BID for seizure.    Past Medical History:  Diagnosis Date   Chronic kidney disease    Chronic systolic CHF (congestive heart failure) (HCC)    EF 35-40, diffuse HK, mild MR, moderate LAE, mild RAE, PASP 44, L pleural eff   Dilated cardiomyopathy (HCC)    likely related to  tachycardia   Dysrhythmia    Persistent atrial fibrillation [facility])    Past Surgical History:  Procedure Laterality Date   CARDIOVERSION N/A 08/20/2017   Procedure: CARDIOVERSION;  Surgeon: [name], MD;  Location: [facility];  Service: Cardiovascular;  Laterality: N/A;   INCISION AND DRAINAGE ABSCESS Left 03/22/2014   Procedure: INCISION AND DRAINAGE ABSCESS LEFT BUTTOCK ABSCESS;  Surgeon: [name], MD;  Location: [facility] OR;  Service: General;  Laterality: Left;    No Known Allergies  Outpatient Encounter Medications as of 05/06/2021  Medication Sig   allopurinol (ZYLOPRIM) 300 MG tablet Take 150 mg by mouth daily.   Amino Acids-Protein Hydrolys (PRO-STAT PO) Take 30 mLs by mouth 2 (two) times daily.   apixaban (ELIQUIS) 5 MG TABS tablet Take 1 tablet (5 mg total) by mouth 2 (two) times daily.   atorvastatin (LIPITOR) 20 MG tablet Take 1 tablet (20 mg total) by mouth every evening.   colchicine 0.6 MG tablet Take 0.6 mg by mouth See admin instructions. 1.2 MG orally AT FIRST SIGN OF GOUT FLARE UP AND REPEAT 0.6 MG IN 1 HOUR IF SYMPTOMS PERSIST   diltiazem (CARDIZEM) 90 MG tablet Take 90 mg by mouth every 8 (eight) hours. Hold for SBP < 100 and Heart rate < 60   divalproex (DEPAKOTE SPRINKLE) 125 MG capsule Take 250 mg by mouth 2 (two) times daily.   feeding supplement (ENSURE ENLIVE / ENSURE PLUS) LIQD Take 237 mLs by mouth 2 (two) times daily between meals.   ferrous sulfate 325 (65 FE) MG tablet Take 325  mg by mouth 2 (two) times daily with a meal.   Menthol, Topical Analgesic, (BIOFREEZE) 4 % GEL Apply topically. APPLY TOPICALLY TO LEFT SHOULDER THREE TIMES DAILY FOR PAIN   midodrine (PROAMATINE) 10 MG tablet Take 1 tablet (10 mg total) by mouth 3 (three) times daily with meals.   NON FORMULARY MAGIC CUP: ONE MAGIC CUP DAILY FOR WOUND HEALING   pantoprazole (PROTONIX) 40 MG tablet Take 1 tablet (40 mg total) by mouth daily.   polyethylene glycol (MIRALAX / GLYCOLAX) 17 g  packet Take 17 g by mouth daily.   tamsulosin (FLOMAX) 0.4 MG CAPS capsule Take 1 capsule (0.4 mg total) by mouth daily after supper.   vitamin C (ASCORBIC ACID) 500 MG tablet Take 500 mg by mouth 2 (two) times daily.   [DISCONTINUED] traMADol (ULTRAM) 50 MG tablet Take 0.5 tablets (25 mg total) by mouth 2 (two) times daily.   [DISCONTINUED] bisacodyl (DULCOLAX) 10 MG suppository Place 10 mg rectally See admin instructions. If not relieved by milk of mag, give 10mg  bisocodyl suppository rectally for one dose in 24 hours as needed for constipation   No facility-administered encounter medications on file as of 05/06/2021.    Review of Systems  Unable to obtain due to dementia   Immunization History  Administered Date(s) Administered   DTaP 12/14/2020   PFIZER(Purple Top)SARS-COV-2 Vaccination 12/07/2020   Pneumococcal Conjugate-13 10/28/2018   Pneumococcal Polysaccharide-23 09/08/2020   Tdap 12/14/2020   Pertinent  Health Maintenance Due  Topic Date Due   COLONOSCOPY (Pts 45-42yrs Insurance coverage will need to be confirmed)  Never done   INFLUENZA VACCINE  05/02/2021   PNA vac Low Risk Adult  Completed   Fall Risk  03/31/2021 01/27/2021 09/08/2020 06/08/2020 10/28/2018  Falls in the past year? 0 1 0 0 0  Number falls in past yr: - 0 0 - -  Injury with Fall? - 1 0 - -  Risk for fall due to : Impaired balance/gait;Impaired mobility History of fall(s);Impaired balance/gait;Impaired mobility - No Fall Risks -  Follow up Falls evaluation completed Falls evaluation completed - - -     Vitals:   05/06/21 0954  BP: 110/72  Pulse: 78  Resp: 20  Temp: 98.6 F (37 C)  Weight: 177 lb 3.2 oz (80.4 kg)  Height: 6' (1.829 m)   Body mass index is 24.03 kg/m.  Physical Exam  GENERAL APPEARANCE: Well nourished. In no acute distress. Normal body habitus SKIN:  Right buttock pressure ulcer, stage IV MOUTH and THROAT: Lips are without lesions. Oral mucosa is moist and without lesions.   RESPIRATORY: Breathing is even & unlabored, BS CTAB CARDIAC: RRR, no murmur,no extra heart sounds, no edema GI: Abdomen soft, normal BS, no masses, no tenderness, NEUROLOGICAL: There is no tremor. Speech is clear. Alert to self, disoriented to time and place. PSYCHIATRIC:  Affect and behavior are appropriate  Labs reviewed: Recent Labs    11/10/20 1508 11/10/20 2000 03/15/21 0348 03/16/21 0320 03/19/21 2115 03/20/21 0429 03/21/21 0501 03/22/21 0445 03/23/21 0500 03/25/21 0908  NA 167*   < > 140 143   < > 142   < > 140 139 137  K 4.1   < > 3.6 3.9   < > 4.7   < > 4.5 4.4 4.2  CL >130*   < > 106 106   < > 108   < > 102 104 103  CO2 20*   < > 26 28   < > 26   < >  28 27 24   GLUCOSE 152*   < > 105* 102*   < > 109*   < > 114* 127* 151*  BUN 85*   < > 23 22   < > 30*   < > 33* 33* 30*  CREATININE 2.58*   < > 1.16 1.30*   < > 2.16*   < > 2.52* 2.47* 2.29*  CALCIUM 8.5*   < > 8.5* 8.7*   < > 8.7*   < > 8.6* 8.5* 8.6*  MG  --    < > 1.9 1.9  --  2.1  --   --   --   --   PHOS 3.5  --   --   --   --  4.9*  --   --   --   --    < > = values in this interval not displayed.   Recent Labs    03/16/21 0320 03/19/21 2115 03/20/21 0429  AST 13* 11* 10*  ALT 9 10 8   ALKPHOS 41 44 44  BILITOT 0.5 0.4 0.7  PROT 5.0* 5.4* 5.6*  ALBUMIN 2.0* 2.0* 2.0*   Recent Labs    03/22/21 0445 03/23/21 0500 03/25/21 0908  WBC 13.4* 13.7* 11.0*  NEUTROABS 10.8* 11.0* 8.8*  HGB 9.6* 9.9* 10.7*  HCT 30.3* 31.4* 35.7*  MCV 84.4 84.2 88.1  PLT 440* 464* 481*   Lab Results  Component Value Date   TSH 5.052 (H) 03/21/2021   Lab Results  Component Value Date   HGBA1C 5.3 03/11/2021   Lab Results  Component Value Date   CHOL 95 03/07/2021   HDL 24 (A) 03/07/2021   LDLCALC 44 03/07/2021   TRIG 137 03/07/2021   CHOLHDL 3.6 10/28/2018    Significant Diagnostic Results in last 30 days:  No results found.  Assessment/Plan  1. Seizure-like activity (HCC) -   no recent seizures, continue  Depakote -  seizure precautions  2. Idiopathic chronic gout of multiple sites without tophus -   no seizure flares, continue allopurinol   3. Pressure injury of right buttock, stage 4 (HCC) -   improving, continue wound treatment daily -    followed by wound consultant  4. Persistent atrial fibrillation (HCC) -   rate-controlled, continue Diltiazem  5. Vascular dementia without behavioral disturbance (HCC) -   BIMS score 7/15, ranging in severe cognitive impairment -   continue supportive care     Family/ staff Communication:  Discussed plan of care with charge nurse.  Labs/tests ordered:   None  Goals of care:   Long-term care   10/30/2018, DNP, MSN, FNP-BC [facility] and [facility] Medicine [phone] (Monday-Friday 8:00 a.m. - 5:00 p.m.) [phone] (after hours)

## 2021-05-09 DIAGNOSIS — M8618 Other acute osteomyelitis, other site: Secondary | ICD-10-CM | POA: Diagnosis not present

## 2021-05-09 DIAGNOSIS — R279 Unspecified lack of coordination: Secondary | ICD-10-CM | POA: Diagnosis not present

## 2021-05-09 DIAGNOSIS — M6281 Muscle weakness (generalized): Secondary | ICD-10-CM | POA: Diagnosis not present

## 2021-05-09 DIAGNOSIS — L89310 Pressure ulcer of right buttock, unstageable: Secondary | ICD-10-CM | POA: Diagnosis not present

## 2021-05-09 DIAGNOSIS — R1311 Dysphagia, oral phase: Secondary | ICD-10-CM | POA: Diagnosis not present

## 2021-05-09 DIAGNOSIS — Z8701 Personal history of pneumonia (recurrent): Secondary | ICD-10-CM | POA: Diagnosis not present

## 2021-05-09 DIAGNOSIS — L89314 Pressure ulcer of right buttock, stage 4: Secondary | ICD-10-CM | POA: Diagnosis not present

## 2021-05-10 DIAGNOSIS — L89310 Pressure ulcer of right buttock, unstageable: Secondary | ICD-10-CM | POA: Diagnosis not present

## 2021-05-10 DIAGNOSIS — M8618 Other acute osteomyelitis, other site: Secondary | ICD-10-CM | POA: Diagnosis not present

## 2021-05-10 DIAGNOSIS — Z8701 Personal history of pneumonia (recurrent): Secondary | ICD-10-CM | POA: Diagnosis not present

## 2021-05-10 DIAGNOSIS — M6281 Muscle weakness (generalized): Secondary | ICD-10-CM | POA: Diagnosis not present

## 2021-05-10 DIAGNOSIS — R1311 Dysphagia, oral phase: Secondary | ICD-10-CM | POA: Diagnosis not present

## 2021-05-10 DIAGNOSIS — R279 Unspecified lack of coordination: Secondary | ICD-10-CM | POA: Diagnosis not present

## 2021-05-11 DIAGNOSIS — R279 Unspecified lack of coordination: Secondary | ICD-10-CM | POA: Diagnosis not present

## 2021-05-11 DIAGNOSIS — R1311 Dysphagia, oral phase: Secondary | ICD-10-CM | POA: Diagnosis not present

## 2021-05-11 DIAGNOSIS — M6281 Muscle weakness (generalized): Secondary | ICD-10-CM | POA: Diagnosis not present

## 2021-05-11 DIAGNOSIS — Z8701 Personal history of pneumonia (recurrent): Secondary | ICD-10-CM | POA: Diagnosis not present

## 2021-05-11 DIAGNOSIS — L89310 Pressure ulcer of right buttock, unstageable: Secondary | ICD-10-CM | POA: Diagnosis not present

## 2021-05-11 DIAGNOSIS — M8618 Other acute osteomyelitis, other site: Secondary | ICD-10-CM | POA: Diagnosis not present

## 2021-05-12 DIAGNOSIS — Z8701 Personal history of pneumonia (recurrent): Secondary | ICD-10-CM | POA: Diagnosis not present

## 2021-05-12 DIAGNOSIS — M8618 Other acute osteomyelitis, other site: Secondary | ICD-10-CM | POA: Diagnosis not present

## 2021-05-12 DIAGNOSIS — R1311 Dysphagia, oral phase: Secondary | ICD-10-CM | POA: Diagnosis not present

## 2021-05-12 DIAGNOSIS — L89310 Pressure ulcer of right buttock, unstageable: Secondary | ICD-10-CM | POA: Diagnosis not present

## 2021-05-12 DIAGNOSIS — R279 Unspecified lack of coordination: Secondary | ICD-10-CM | POA: Diagnosis not present

## 2021-05-12 DIAGNOSIS — M6281 Muscle weakness (generalized): Secondary | ICD-10-CM | POA: Diagnosis not present

## 2021-05-13 DIAGNOSIS — Z8701 Personal history of pneumonia (recurrent): Secondary | ICD-10-CM | POA: Diagnosis not present

## 2021-05-13 DIAGNOSIS — R279 Unspecified lack of coordination: Secondary | ICD-10-CM | POA: Diagnosis not present

## 2021-05-13 DIAGNOSIS — R1311 Dysphagia, oral phase: Secondary | ICD-10-CM | POA: Diagnosis not present

## 2021-05-13 DIAGNOSIS — M8618 Other acute osteomyelitis, other site: Secondary | ICD-10-CM | POA: Diagnosis not present

## 2021-05-13 DIAGNOSIS — L89310 Pressure ulcer of right buttock, unstageable: Secondary | ICD-10-CM | POA: Diagnosis not present

## 2021-05-13 DIAGNOSIS — M6281 Muscle weakness (generalized): Secondary | ICD-10-CM | POA: Diagnosis not present

## 2021-05-16 DIAGNOSIS — M6281 Muscle weakness (generalized): Secondary | ICD-10-CM | POA: Diagnosis not present

## 2021-05-16 DIAGNOSIS — R279 Unspecified lack of coordination: Secondary | ICD-10-CM | POA: Diagnosis not present

## 2021-05-16 DIAGNOSIS — L89314 Pressure ulcer of right buttock, stage 4: Secondary | ICD-10-CM | POA: Diagnosis not present

## 2021-05-16 DIAGNOSIS — M8618 Other acute osteomyelitis, other site: Secondary | ICD-10-CM | POA: Diagnosis not present

## 2021-05-16 DIAGNOSIS — R1311 Dysphagia, oral phase: Secondary | ICD-10-CM | POA: Diagnosis not present

## 2021-05-16 DIAGNOSIS — L89310 Pressure ulcer of right buttock, unstageable: Secondary | ICD-10-CM | POA: Diagnosis not present

## 2021-05-16 DIAGNOSIS — Z8701 Personal history of pneumonia (recurrent): Secondary | ICD-10-CM | POA: Diagnosis not present

## 2021-05-17 ENCOUNTER — Other Ambulatory Visit: Payer: Self-pay

## 2021-05-17 ENCOUNTER — Other Ambulatory Visit: Payer: Self-pay | Admitting: Adult Health

## 2021-05-17 ENCOUNTER — Ambulatory Visit (INDEPENDENT_AMBULATORY_CARE_PROVIDER_SITE_OTHER): Payer: Medicare Other | Admitting: Cardiology

## 2021-05-17 ENCOUNTER — Encounter: Payer: Self-pay | Admitting: Cardiology

## 2021-05-17 VITALS — BP 106/64 | HR 77 | Ht 72.0 in | Wt 180.0 lb

## 2021-05-17 DIAGNOSIS — R5381 Other malaise: Secondary | ICD-10-CM | POA: Diagnosis not present

## 2021-05-17 DIAGNOSIS — R279 Unspecified lack of coordination: Secondary | ICD-10-CM | POA: Diagnosis not present

## 2021-05-17 DIAGNOSIS — M8618 Other acute osteomyelitis, other site: Secondary | ICD-10-CM | POA: Diagnosis not present

## 2021-05-17 DIAGNOSIS — R1311 Dysphagia, oral phase: Secondary | ICD-10-CM | POA: Diagnosis not present

## 2021-05-17 DIAGNOSIS — Z8701 Personal history of pneumonia (recurrent): Secondary | ICD-10-CM | POA: Diagnosis not present

## 2021-05-17 DIAGNOSIS — I501 Left ventricular failure: Secondary | ICD-10-CM | POA: Diagnosis not present

## 2021-05-17 DIAGNOSIS — I4819 Other persistent atrial fibrillation: Secondary | ICD-10-CM | POA: Diagnosis not present

## 2021-05-17 DIAGNOSIS — M6281 Muscle weakness (generalized): Secondary | ICD-10-CM | POA: Diagnosis not present

## 2021-05-17 DIAGNOSIS — L89310 Pressure ulcer of right buttock, unstageable: Secondary | ICD-10-CM | POA: Diagnosis not present

## 2021-05-17 DIAGNOSIS — I4891 Unspecified atrial fibrillation: Secondary | ICD-10-CM | POA: Diagnosis not present

## 2021-05-17 DIAGNOSIS — Z7401 Bed confinement status: Secondary | ICD-10-CM | POA: Diagnosis not present

## 2021-05-17 DIAGNOSIS — R5383 Other fatigue: Secondary | ICD-10-CM | POA: Diagnosis not present

## 2021-05-17 MED ORDER — AMIODARONE HCL 200 MG PO TABS
200.0000 mg | ORAL_TABLET | Freq: Every day | ORAL | 1 refills | Status: DC
  Filled 2021-05-17: qty 90, 90d supply, fill #0

## 2021-05-17 MED ORDER — TRAMADOL HCL 50 MG PO TABS: 25.0000 mg | ORAL_TABLET | Freq: Two times a day (BID) | ORAL | 0 refills | Status: DC

## 2021-05-17 MED ORDER — AMIODARONE HCL 200 MG PO TABS
200.0000 mg | ORAL_TABLET | Freq: Every day | ORAL | 0 refills | Status: DC
  Filled 2021-05-17: qty 84, 84d supply, fill #0

## 2021-05-17 NOTE — Patient Instructions (Signed)
Medication Instructions:  Your physician has recommended you make the following change in your medication:  START Amiodarone  - take 2 tablets (400 mg total) TWICE a day for 2 weeks, then  - take 1 tablet (200 mg total) TWICE a day for 2 weeks, then  - take 1 tablet (200 mg total) ONCE a day   *If you need a refill on your cardiac medications before your next appointment, please call your pharmacy*   Lab Work: None ordered   Testing/Procedures: Your physician has recommended that you have a Cardioversion (DCCV). Electrical Cardioversion uses a jolt of electricity to your heart either through paddles or wired patches attached to your chest. This is a controlled, usually prescheduled, procedure. Defibrillation is done under light anesthesia in the hospital, and you usually go home the day of the procedure. This is done to get your heart back into a normal rhythm. You are not awake for the procedure. Please see the instruction sheet below.  The nurse will call to arrange this.    Follow-Up: At [facility], you and your health needs are our priority.  As part of our continuing mission to provide you with exceptional heart care, we have created designated Provider Care Teams.  These Care Teams include your primary Cardiologist (physician) and Advanced Practice Providers (APPs -  Physician Assistants and Nurse Practitioners) who all work together to provide you with the care you need, when you need it.  Your next appointment:   3 month(s)  The format for your next appointment:   In Person  Provider:   Loman Brooklyn, MD    Thank you for choosing [facility]!!   Dory Horn, RN [phone]   Other Instructions  You are scheduled for a Cardioversion on ____________ with Dr. _________________.  Please arrive at the [facility] (Main Entrance A) at [facility]: [address] [place], [place] [address] at ____________ am/pm. (1 hour prior to procedure unless lab work  is needed; if lab work is needed arrive 1.5 hours ahead)  DIET: Nothing to eat or drink after midnight except a sip of water with medications (see medication instructions below)  FYI: For your safety, and to allow [place] to monitor your vital signs accurately during the surgery/procedure we request that   if you have artificial nails, gel coating, SNS etc. Please have those removed prior to your surgery/procedure. Not having the nail coverings /polish removed may result in cancellation or delay of your surgery/procedure.   Medication Instructions: Hold __________________  Continue your anticoagulant: Eliquis You will need to continue your anticoagulant after your procedure until you are told by your provider that it is safe to stop   Labs: _______________  Bonita Quin must have a responsible person to drive you home and stay in the waiting area during your procedure. Failure to do so could result in cancellation.  Bring your insurance cards.  *Special Note: Every effort is made to have your procedure done on time. Occasionally there are emergencies that occur at the hospital that may cause delays. Please be patient if a delay does occur.      Amiodarone Tablets What is this medication? AMIODARONE (a MEE oh da rone) prevents and treats a fast or irregular heartbeat (arrhythmia). It works by slowing down overactive electric signals in the heart, which stabilizes your heart rhythm. It belongs to a group of medicationscalled antiarrhythmics. This medicine may be used for other purposes; ask your health care provider orpharmacist if you have  questions. COMMON BRAND NAME(S): Cordarone, Pacerone What should I tell my care team before I take this medication? They need to know if you have any of these conditions: Liver disease Lung disease Other heart problems Thyroid disease An unusual or allergic reaction to amiodarone, iodine, other medications, foods, dyes, or preservatives Pregnant or trying to  get pregnant Breast-feeding How should I use this medication? Take this medication by mouth with a glass of water. Follow the directions on the prescription label. You can take this medication with or without food. However, you should always take it the same way each time. Take your doses at regular intervals. Do not take your medication more often than directed. Do notstop taking except on the advice of your care team. A special MedGuide will be given to you by the pharmacist with eachprescription and refill. Be sure to read this information carefully each time. Talk to your care team regarding the use of this medication in children.Special care may be needed. Overdosage: If you think you have taken too much of this medicine contact apoison control center or emergency room at once. NOTE: This medicine is only for you. Do not share this medicine with others. What if I miss a dose? If you miss a dose, take it as soon as you can. If it is almost time for yournext dose, take only that dose. Do not take double or extra doses. What may interact with this medication? Do not take this medication with any of the following: Abarelix Apomorphine Arsenic trioxide Certain antibiotics like erythromycin, gemifloxacin, levofloxacin, pentamidine Certain medications for depression like amoxapine, tricyclic antidepressants Certain medications for fungal infections like fluconazole, itraconazole, ketoconazole, posaconazole, voriconazole Certain medications for irregular heartbeat like disopyramide, dronedarone, ibutilide, propafenone, sotalol Certain medications for malaria like chloroquine, halofantrine Cisapride Droperidol Haloperidol [place] Maprotiline Methadone Phenothiazines like chlorpromazine, mesoridazine, thioridazine Pimozide Ranolazine Red yeast rice Vardenafil This medication may also interact with the following: Antiviral medications for HIV or AIDS Certain medications for blood  pressure, heart disease, irregular heart beat Certain medications for cholesterol like atorvastatin, cerivastatin, lovastatin, simvastatin Certain medications for hepatitis C like sofosbuvir and ledipasvir; sofosbuvir Certain medications for seizures like phenytoin Certain medications for thyroid problems Certain medications that treat or prevent blood clots like warfarin Cholestyramine Cimetidine Clopidogrel Cyclosporine Dextromethorphan Diuretics Dofetilide Fentanyl General anesthetics Grapefruit juice Lidocaine Loratadine Methotrexate Other medications that prolong the QT interval (cause an abnormal heart rhythm) Procainamide Quinidine Rifabutin, rifampin, or rifapentine St. John's Wort Trazodone Ziprasidone This list may not describe all possible interactions. Give your health care provider a list of all the medicines, herbs, non-prescription drugs, or dietary supplements you use. Also tell them if you smoke, drink alcohol, or use illegaldrugs. Some items may interact with your medicine. What should I watch for while using this medication? Your condition will be monitored closely when you first begin therapy. Often, this medication is first started in a hospital or other monitored health care setting. Once you are on maintenance therapy, visit your care team for regular checks on your progress. Because your condition and use of this medication carry some risk, it is a good idea to carry an identification card, necklace orbracelet with details of your condition, medications, and care team. You may get drowsy or dizzy. Do not drive, use machinery, or do anything that needs mental alertness until you know how this medication affects you. Do not stand or sit up quickly, especially if you are an older patient. This reducesthe risk of dizzy or  fainting spells. This medication can make you more sensitive to the sun. Keep out of the sun. If you cannot avoid being in the sun, wear protective  clothing and use sunscreen.Do not use sun lamps or tanning beds/booths. You should have regular eye exams before and during treatment. Call your care team if you have blurred vision, see halos, or your eyes become sensitive to light. Your eyes may get dry. It may be helpful to use a lubricating eyesolution or artificial tears solution. If you are going to have surgery or a procedure that requires contrast dyes,tell your care team that you are taking this medication. What side effects may I notice from receiving this medication? Side effects that you should report to your care team as soon as possible: Allergic reactions-skin rash, itching, hives, swelling of the face, lips, tongue, or throat Bluish-gray skin Change in vision such as blurry vision, seeing halos around lights, vision loss Heart failure-shortness of breath, swelling of the ankles, feet, or hands, sudden weight gain, unusual weakness or fatigue Heart rhythm changes-fast or irregular heartbeat, dizziness, feeling faint or lightheaded, chest pain, trouble breathing High thyroid levels (hyperthyroidism)-fast or irregular heartbeat, weight loss, excessive sweating or sensitivity to heat, tremors or shaking, anxiety, nervousness, irregular menstrual cycle or spotting Liver injury-right upper belly pain, loss of appetite, nausea, light-colored stool, dark yellow or brown urine, yellowing skin or eyes, unusual weakness or fatigue Low thyroid levels (hypothyroidism)-unusual weakness or fatigue, sensitivity to cold, constipation, hair loss, dry skin, weight gain, feelings of depression Lung injury-shortness of breath or trouble breathing, cough, spitting up blood, chest pain, fever Pain, tingling, or numbness in the hands or feet, muscle weakness, trouble walking, loss of balance or coordination Side effects that usually do not require medical attention (report to your careteam if they continue or are bothersome): Nausea Vomiting This list may  not describe all possible side effects. Call your doctor for medical advice about side effects. You may report side effects to FDA at1-800-FDA-1088. Where should I keep my medication? Keep out of the reach of children and pets. Store at room temperature between 20 and 25 degrees C (68 and 77 degrees F). Protect from light. Keep container tightly closed. Throw away any unusedmedication after the expiration date. NOTE: This sheet is a summary. It may not cover all possible information. If you have questions about this medicine, talk to your doctor, pharmacist, orhealth care provider.  2022 Elsevier/Gold Standard (2020-10-21 11:04:04)

## 2021-05-17 NOTE — Progress Notes (Signed)
Electrophysiology Office Note   Date:  05/17/2021   ID:  Jeffery Christian, DOB January 27, 1951, MRN [id number]  PCP:  Jeffery Register, MD  Cardiologist:   Primary Electrophysiologist: Jeffery Sweney [name], MD    Chief Complaint: AF   History of Present Illness: Jeffery Christian is a 70 y.o. male who is being seen today for the evaluation of AF at the request of Jeffery Register, MD. Presenting today for electrophysiology evaluation.  He has a history significant for CKD stage III, persistent atrial fibrillation.  He was hospitalized to 822 after being found down by his friends.  His hospital course was complicated by COVID-19 infection, due to hypovolemia, acute on chronic kidney disease, metabolic encephalopathy.  Since then, he has had multiple hospitalizations and emergency room visits for various issues, most recently UTI and sepsis.  He presents to clinic today on a stretcher via EMS.  He has been working with physical therapy at [facility].  He is somewhat unaware of his atrial fibrillation, though he has not been doing much activity.  He would like to get back into normal rhythm.  Today, he denies symptoms of palpitations, chest pain, shortness of breath, orthopnea, PND, lower extremity edema, claudication, dizziness, presyncope, syncope, bleeding, or neurologic sequela. The patient is tolerating medications without difficulties.    Past Medical History:  Diagnosis Date   Chronic kidney disease    Chronic systolic CHF (congestive heart failure) (HCC)    EF 35-40, diffuse HK, mild MR, moderate LAE, mild RAE, PASP 44, L pleural eff   Dilated cardiomyopathy (HCC)    likely related to tachycardia   Dysrhythmia    Persistent atrial fibrillation [facility])    Past Surgical History:  Procedure Laterality Date   CARDIOVERSION N/A 08/20/2017   Procedure: CARDIOVERSION;  Surgeon: Jeffery Bathe, MD;  Location: [facility] ENDOSCOPY;  Service: Cardiovascular;  Laterality: N/A;   INCISION AND DRAINAGE ABSCESS Left 03/22/2014    Procedure: INCISION AND DRAINAGE ABSCESS LEFT BUTTOCK ABSCESS;  Surgeon: Jeffery Malady, MD;  Location: [facility] OR;  Service: General;  Laterality: Left;     Current Outpatient Medications  Medication Sig Dispense Refill   allopurinol (ZYLOPRIM) 300 MG tablet Take 150 mg by mouth daily.     Amino Acids-Protein Hydrolys (PRO-STAT PO) Take 30 mLs by mouth 2 (two) times daily.     amiodarone (PACERONE) 200 MG tablet Take 1 tablet (200 mg total) by mouth daily. 84 tablet 0   amiodarone (PACERONE) 200 MG tablet Take 1 tablet (200 mg total) by mouth daily. 90 tablet 1   apixaban (ELIQUIS) 5 MG TABS tablet Take 1 tablet (5 mg total) by mouth 2 (two) times daily.     atorvastatin (LIPITOR) 20 MG tablet Take 1 tablet (20 mg total) by mouth every evening.     colchicine 0.6 MG tablet Take 0.6 mg by mouth See admin instructions. 1.2 MG orally AT FIRST SIGN OF GOUT FLARE UP AND REPEAT 0.6 MG IN 1 HOUR IF SYMPTOMS PERSIST     diltiazem (CARDIZEM) 90 MG tablet Take 90 mg by mouth every 8 (eight) hours. Hold for SBP < 100 and Heart rate < 60     divalproex (DEPAKOTE SPRINKLE) 125 MG capsule Take 250 mg by mouth 2 (two) times daily.     feeding supplement (ENSURE ENLIVE / ENSURE PLUS) LIQD Take 237 mLs by mouth 2 (two) times daily between meals. 237 mL 12   ferrous sulfate 325 (65 FE) MG tablet Take 325 mg by mouth  2 (two) times daily with a meal.     Menthol, Topical Analgesic, (BIOFREEZE) 4 % GEL Apply topically. APPLY TOPICALLY TO LEFT SHOULDER THREE TIMES DAILY FOR PAIN     midodrine (PROAMATINE) 10 MG tablet Take 1 tablet (10 mg total) by mouth 3 (three) times daily with meals.     NON FORMULARY MAGIC CUP: ONE MAGIC CUP DAILY FOR WOUND HEALING     pantoprazole (PROTONIX) 40 MG tablet Take 1 tablet (40 mg total) by mouth daily.     polyethylene glycol (MIRALAX / GLYCOLAX) 17 g packet Take 17 g by mouth daily.     tamsulosin (FLOMAX) 0.4 MG CAPS capsule Take 1 capsule (0.4 mg total) by mouth daily after  supper. 30 capsule    traMADol (ULTRAM) 50 MG tablet Take 0.5 tablets (25 mg total) by mouth 2 (two) times daily. 30 tablet 0   vitamin C (ASCORBIC ACID) 500 MG tablet Take 500 mg by mouth 2 (two) times daily.     No current facility-administered medications for this visit.    Allergies:   Patient has no known allergies.   Social History:  The patient  reports that he has never smoked. He has never used smokeless tobacco. He reports that he does not currently use alcohol. He reports that he does not use drugs.   Family History:  The patient's family history includes Cancer in his mother; Leukemia in his father.    ROS:  Please see the history of present illness.   Otherwise, review of systems is positive for none.   All other systems are reviewed and negative.    PHYSICAL EXAM: VS:  BP 106/64   Pulse 77   Ht 6' (1.829 m)   Wt 180 lb (81.6 kg)   BMI 24.41 kg/m  , BMI Body mass index is 24.41 kg/m. GEN: Well nourished, well developed, in no acute distress  HEENT: normal  Neck: no JVD, carotid bruits, or masses Cardiac: Irregular; no murmurs, rubs, or gallops,no edema  Respiratory:  clear to auscultation bilaterally, normal work of breathing GI: soft, nontender, nondistended, + BS MS: no deformity or atrophy  Skin: warm and dry Neuro:  Strength and sensation are intact Psych: euthymic mood, full affect  EKG:  EKG is ordered today. Personal review of the ekg ordered shows atrial fibrillation, rate 77  Recent Labs: 03/16/2021: B Natriuretic Peptide 86.5 03/20/2021: ALT 8; Magnesium 2.1 03/21/2021: TSH 5.052 03/25/2021: BUN 30; Creatinine, Ser 2.29; Hemoglobin 10.7; Platelets 481; Potassium 4.2; Sodium 137    Lipid Panel     Component Value Date/Time   CHOL 95 03/07/2021 0000   CHOL 139 10/28/2018 1014   TRIG 137 03/07/2021 0000   HDL 24 (A) 03/07/2021 0000   HDL 39 (L) 10/28/2018 1014   CHOLHDL 3.6 10/28/2018 1014   LDLCALC 44 03/07/2021 0000   LDLCALC 65 10/28/2018  1014     Wt Readings from Last 3 Encounters:  05/17/21 180 lb (81.6 kg)  05/06/21 177 lb 3.2 oz (80.4 kg)  04/14/21 176 lb (79.8 kg)      Other studies Reviewed: Additional studies/ records that were reviewed today include: TTE 11/10/20  Review of the above records today demonstrates:   1. Left ventricular ejection fraction, by estimation, is 60 to 65%. The  left ventricle has normal function. The left ventricle has no regional  wall motion abnormalities. There is moderate left ventricular hypertrophy.  Left ventricular diastolic  parameters are indeterminate.   2. Right ventricular  systolic function is normal. The right ventricular  size is normal.   3. Left atrial size was mildly dilated.   4. The mitral valve is normal in structure. Trivial mitral valve  regurgitation. No evidence of mitral stenosis.   5. The aortic valve was not well visualized. Aortic valve regurgitation  is not visualized. No aortic stenosis is present.   6. The inferior vena cava is dilated in size with >50% respiratory  variability, suggesting right atrial pressure of 8 mmHg.    ASSESSMENT AND PLAN:  1.  Persistent atrial fibrillation: Currently on Eliquis and diltiazem.  CHA2DS2-VASc of 2.  He does not have much weakness or fatigue, though he would like to get back into normal rhythm as he feels like this Jeffery Christian help him with his rehab.  Due to that, we Jeffery Christian start him on amiodarone today.  Once he is fully loaded, we Everlena Mackley plan for cardioversion.  The goal is that he Jeffery Christian be on amiodarone for short period of time.  As he continues to improve and get stronger and more ambulatory, he may be a candidate for ablation.  Case discussed with primary physician  Current medicines are reviewed at length with the patient today.   The patient does not have concerns regarding his medicines.  The following changes were made today: Start amiodarone  Labs/ tests ordered today include:  Orders Placed This Encounter   Procedures   EKG 12-Lead     Disposition:   FU with Jeffery Christian 3 months  Signed, Jeffery Makin [name], MD  05/17/2021 9:54 AM     [facility] [address] [place] [address] [phone] (office) [phone] (fax)

## 2021-05-18 ENCOUNTER — Other Ambulatory Visit: Payer: Self-pay

## 2021-05-18 ENCOUNTER — Non-Acute Institutional Stay (SKILLED_NURSING_FACILITY): Payer: Medicare Other | Admitting: Adult Health

## 2021-05-18 ENCOUNTER — Encounter: Payer: Self-pay | Admitting: Adult Health

## 2021-05-18 DIAGNOSIS — M1A09X0 Idiopathic chronic gout, multiple sites, without tophus (tophi): Secondary | ICD-10-CM

## 2021-05-18 DIAGNOSIS — I9589 Other hypotension: Secondary | ICD-10-CM

## 2021-05-18 DIAGNOSIS — I4819 Other persistent atrial fibrillation: Secondary | ICD-10-CM | POA: Diagnosis not present

## 2021-05-18 DIAGNOSIS — Z7189 Other specified counseling: Secondary | ICD-10-CM

## 2021-05-18 DIAGNOSIS — M6281 Muscle weakness (generalized): Secondary | ICD-10-CM | POA: Diagnosis not present

## 2021-05-18 DIAGNOSIS — F0150 Vascular dementia without behavioral disturbance: Secondary | ICD-10-CM | POA: Diagnosis not present

## 2021-05-18 DIAGNOSIS — M8618 Other acute osteomyelitis, other site: Secondary | ICD-10-CM | POA: Diagnosis not present

## 2021-05-18 DIAGNOSIS — L89314 Pressure ulcer of right buttock, stage 4: Secondary | ICD-10-CM | POA: Diagnosis not present

## 2021-05-18 DIAGNOSIS — R1311 Dysphagia, oral phase: Secondary | ICD-10-CM | POA: Diagnosis not present

## 2021-05-18 DIAGNOSIS — Z8701 Personal history of pneumonia (recurrent): Secondary | ICD-10-CM | POA: Diagnosis not present

## 2021-05-18 DIAGNOSIS — R279 Unspecified lack of coordination: Secondary | ICD-10-CM | POA: Diagnosis not present

## 2021-05-18 DIAGNOSIS — R569 Unspecified convulsions: Secondary | ICD-10-CM | POA: Diagnosis not present

## 2021-05-18 DIAGNOSIS — F4320 Adjustment disorder, unspecified: Secondary | ICD-10-CM | POA: Diagnosis not present

## 2021-05-18 DIAGNOSIS — L89310 Pressure ulcer of right buttock, unstageable: Secondary | ICD-10-CM | POA: Diagnosis not present

## 2021-05-18 NOTE — Progress Notes (Signed)
Location:  [facility] Room Number: 106 A Place of Service:  SNF (31) Provider:  Kenard Gower, DNP, FNP-BC  Patient Care Team: Hoy Register, MD as PCP - General (Family Medicine) Regan Lemming, MD as PCP - Electrophysiology (Cardiology)  Extended Emergency Contact Information Primary Emergency Contact: Glendora Score [place] Home Phone: [phone] Relation: Daughter Secondary Emergency Contact: SAPP,RON Home Phone: [phone] Relation: Other  Code Status:  DNR  Goals of care: Advanced Directive information Advanced Directives 05/18/2021  Does Patient Have a Medical Advance Directive? Yes  Type of Advance Directive Out of facility DNR (pink MOST or yellow form)  Does patient want to make changes to medical advance directive? -  Would patient like information on creating a medical advance directive? -  Pre-existing out of facility DNR order (yellow form or pink MOST form) -     Chief Complaint  Patient presents with   Follow-up    Care plan meeting    HPI:  Pt is a 70 y.o. male who had a care plan meeting today. He is a long-term care resident of [facility]. The meeting was attended by Child psychotherapist, dietitian,NP and life enrichment. He remains to be DNR. Discussed medications, vital signs and weights. Latest weight is  180.8 lbs, stable. He attends the birthday parties in the dining room. He is scheduled to have have cardioversion. He continues to have right buttock pressure ulcer stage IV. Daily treatment is being done and followed by wound consultant. He participates in BLE PROM.  The meeting lasted for 20 minutes.   Past Medical History:  Diagnosis Date   Chronic kidney disease    Chronic systolic CHF (congestive heart failure) (HCC)    EF 35-40, diffuse HK, mild MR, moderate LAE, mild RAE, PASP 44, L pleural eff   Dilated cardiomyopathy (HCC)    likely related to tachycardia    Dysrhythmia    Persistent atrial fibrillation [facility])    Past Surgical History:  Procedure Laterality Date   CARDIOVERSION N/A 08/20/2017   Procedure: CARDIOVERSION;  Surgeon: [name], MD;  Location: [facility] ENDOSCOPY;  Service: Cardiovascular;  Laterality: N/A;   INCISION AND DRAINAGE ABSCESS Left 03/22/2014   Procedure: INCISION AND DRAINAGE ABSCESS LEFT BUTTOCK ABSCESS;  Surgeon: [name], MD;  Location: [facility] OR;  Service: General;  Laterality: Left;    No Known Allergies  Outpatient Encounter Medications as of 05/18/2021  Medication Sig   allopurinol (ZYLOPRIM) 300 MG tablet Take 150 mg by mouth daily.   Amino Acids-Protein Hydrolys (PRO-STAT PO) Take 30 mLs by mouth 2 (two) times daily.   amiodarone (PACERONE) 400 MG tablet Take 400 mg by mouth daily. 400 mg TWICE A DAY FOR TWO WEEKS (05/18/2021-05/31/2021) 400 mg daily for 2 weeks (05/31/2021-06/14/2021 200 mg daily 09/14   apixaban (ELIQUIS) 5 MG TABS tablet Take 1 tablet (5 mg total) by mouth 2 (two) times daily.   atorvastatin (LIPITOR) 20 MG tablet Take 1 tablet (20 mg total) by mouth every evening.   colchicine 0.6 MG tablet Take 0.6 mg by mouth See admin instructions. 1.2 MG orally AT FIRST SIGN OF GOUT FLARE UP AND REPEAT 0.6 MG IN 1 HOUR IF SYMPTOMS PERSIST   diltiazem (CARDIZEM) 90 MG tablet Take 90 mg by mouth every 8 (eight) hours. Hold for SBP < 100 and Heart rate < 60   divalproex (DEPAKOTE SPRINKLE) 125 MG capsule Take 250 mg by mouth 2 (two) times daily.  feeding supplement (ENSURE ENLIVE / ENSURE PLUS) LIQD Take 237 mLs by mouth 2 (two) times daily between meals.   ferrous sulfate 325 (65 FE) MG tablet Take 325 mg by mouth 2 (two) times daily with a meal.   Menthol, Topical Analgesic, (BIOFREEZE) 4 % GEL Apply topically. APPLY TOPICALLY TO LEFT SHOULDER THREE TIMES DAILY FOR PAIN   midodrine (PROAMATINE) 10 MG tablet Take 1 tablet (10 mg total) by mouth 3 (three) times daily with meals.   NON FORMULARY MAGIC  CUP: ONE MAGIC CUP DAILY FOR WOUND HEALING   pantoprazole (PROTONIX) 40 MG tablet Take 1 tablet (40 mg total) by mouth daily.   polyethylene glycol (MIRALAX / GLYCOLAX) 17 g packet Take 17 g by mouth daily.   tamsulosin (FLOMAX) 0.4 MG CAPS capsule Take 1 capsule (0.4 mg total) by mouth daily after supper.   traMADol (ULTRAM) 50 MG tablet Take 0.5 tablets (25 mg total) by mouth 2 (two) times daily.   vitamin C (ASCORBIC ACID) 500 MG tablet Take 500 mg by mouth 2 (two) times daily.   [DISCONTINUED] amiodarone (PACERONE) 200 MG tablet Take 1 tablet (200 mg total) by mouth daily.   [DISCONTINUED] amiodarone (PACERONE) 200 MG tablet Take 1 tablet (200 mg total) by mouth daily.   No facility-administered encounter medications on file as of 05/18/2021.    Review of Systems  GENERAL: No change in appetite, no fatigue, no weight changes, no fever or chills  MOUTH and THROAT: Denies oral discomfort, gingival pain or bleeding RESPIRATORY: no cough, SOB, DOE, wheezing, hemoptysis CARDIAC: No chest pain, edema or palpitations GI: No abdominal pain, diarrhea, constipation, heart burn, nausea or vomiting GU: Denies dysuria, frequency, hematuria or discharge NEUROLOGICAL: Denies dizziness, syncope, numbness, or headache PSYCHIATRIC: Denies feelings of depression or anxiety. No report of hallucinations, insomnia, paranoia, or agitation   Immunization History  Administered Date(s) Administered   DTaP 12/14/2020   PFIZER(Purple Top)SARS-COV-2 Vaccination 12/07/2020   Pneumococcal Conjugate-13 10/28/2018   Pneumococcal Polysaccharide-23 09/08/2020   Tdap 12/14/2020   Pertinent  Health Maintenance Due  Topic Date Due   COLONOSCOPY (Pts 45-53yrs Insurance coverage will need to be confirmed)  Never done   INFLUENZA VACCINE  05/02/2021   PNA vac Low Risk Adult  Completed   Fall Risk  03/31/2021 01/27/2021 09/08/2020 06/08/2020 10/28/2018  Falls in the past year? 0 1 0 0 0  Number falls in past yr: - 0 0 -  -  Injury with Fall? - 1 0 - -  Risk for fall due to : Impaired balance/gait;Impaired mobility History of fall(s);Impaired balance/gait;Impaired mobility - No Fall Risks -  Follow up Falls evaluation completed Falls evaluation completed - - -     Vitals:   05/18/21 1001  BP: 118/72  Pulse: 77  Resp: 17  Temp: 97.9 F (36.6 C)  Height: 6' (1.829 m)   Body mass index is 24.41 kg/m.  Physical Exam  GENERAL APPEARANCE: Well nourished. In no acute distress. Normal body habitus SKIN:  Right buttock pressure ulcer stage IV MOUTH and THROAT: Lips are without lesions. Oral mucosa is moist and without lesions.  RESPIRATORY: Breathing is even & unlabored, BS CTAB CARDIAC: irregular heart rhythm, no murmur,no extra heart sounds GI: Abdomen soft, normal BS, no masses, no tenderness NEUROLOGICAL: There is no tremor. Speech is clear. Alert to self, disoriented to time and place. PSYCHIATRIC: . Affect and behavior are appropriate  Labs reviewed: Recent Labs    11/10/20 1508 11/10/20 2000 03/15/21 0348  03/16/21 0320 03/19/21 2115 03/20/21 0429 03/21/21 0501 03/22/21 0445 03/23/21 0500 03/25/21 0908  NA 167*   < > 140 143   < > 142   < > 140 139 137  K 4.1   < > 3.6 3.9   < > 4.7   < > 4.5 4.4 4.2  CL >130*   < > 106 106   < > 108   < > 102 104 103  CO2 20*   < > 26 28   < > 26   < > 28 27 24   GLUCOSE 152*   < > 105* 102*   < > 109*   < > 114* 127* 151*  BUN 85*   < > 23 22   < > 30*   < > 33* 33* 30*  CREATININE 2.58*   < > 1.16 1.30*   < > 2.16*   < > 2.52* 2.47* 2.29*  CALCIUM 8.5*   < > 8.5* 8.7*   < > 8.7*   < > 8.6* 8.5* 8.6*  MG  --    < > 1.9 1.9  --  2.1  --   --   --   --   PHOS 3.5  --   --   --   --  4.9*  --   --   --   --    < > = values in this interval not displayed.   Recent Labs    03/16/21 0320 03/19/21 2115 03/20/21 0429  AST 13* 11* 10*  ALT 9 10 8   ALKPHOS 41 44 44  BILITOT 0.5 0.4 0.7  PROT 5.0* 5.4* 5.6*  ALBUMIN 2.0* 2.0* 2.0*   Recent Labs     03/22/21 0445 03/23/21 0500 03/25/21 0908  WBC 13.4* 13.7* 11.0*  NEUTROABS 10.8* 11.0* 8.8*  HGB 9.6* 9.9* 10.7*  HCT 30.3* 31.4* 35.7*  MCV 84.4 84.2 88.1  PLT 440* 464* 481*   Lab Results  Component Value Date   TSH 5.052 (H) 03/21/2021   Lab Results  Component Value Date   HGBA1C 5.3 03/11/2021   Lab Results  Component Value Date   CHOL 95 03/07/2021   HDL 24 (A) 03/07/2021   LDLCALC 44 03/07/2021   TRIG 137 03/07/2021   CHOLHDL 3.6 10/28/2018    Significant Diagnostic Results in last 30 days:  No results found.  Assessment/Plan  1. ACP (advance care planning) -    remains to be DNR -    discussed medications, vital signs and weights  2. Persistent atrial fibrillation (HCC) -  rate-controlled , continue diltiazem and amiodarone   3. Seizure-like activity (HCC) -   No recent seizures, continue Depakote  4. Pressure injury of right buttock, stage 4 (HCC) -   continue wound treatment daily -    followed up by wound consultant  5. Idiopathic chronic gout of multiple sites without tophus -   Stable, continue allopurinol  6. Chronic hypotension -   Continue midodrine  7. Vascular dementia without behavioral disturbance (HCC) -   BIMS score 7/15, ranging in severe cognitive impairment -    continue supportive care    Family/ staff Communication:   Discussed plan of care with IDT.  Labs/tests ordered: None  Goals of care:   Long-term care   10/30/2018, DNP, MSN, FNP-BC [facility] [phone] (Monday-Friday 8:00 a.m. - 5:00 p.m.) [phone] (after hours)

## 2021-05-19 DIAGNOSIS — M6281 Muscle weakness (generalized): Secondary | ICD-10-CM | POA: Diagnosis not present

## 2021-05-19 DIAGNOSIS — R279 Unspecified lack of coordination: Secondary | ICD-10-CM | POA: Diagnosis not present

## 2021-05-19 DIAGNOSIS — L89310 Pressure ulcer of right buttock, unstageable: Secondary | ICD-10-CM | POA: Diagnosis not present

## 2021-05-19 DIAGNOSIS — Z8701 Personal history of pneumonia (recurrent): Secondary | ICD-10-CM | POA: Diagnosis not present

## 2021-05-19 DIAGNOSIS — M8618 Other acute osteomyelitis, other site: Secondary | ICD-10-CM | POA: Diagnosis not present

## 2021-05-19 DIAGNOSIS — R1311 Dysphagia, oral phase: Secondary | ICD-10-CM | POA: Diagnosis not present

## 2021-05-20 DIAGNOSIS — M8618 Other acute osteomyelitis, other site: Secondary | ICD-10-CM | POA: Diagnosis not present

## 2021-05-20 DIAGNOSIS — Z8701 Personal history of pneumonia (recurrent): Secondary | ICD-10-CM | POA: Diagnosis not present

## 2021-05-20 DIAGNOSIS — M6281 Muscle weakness (generalized): Secondary | ICD-10-CM | POA: Diagnosis not present

## 2021-05-20 DIAGNOSIS — L89310 Pressure ulcer of right buttock, unstageable: Secondary | ICD-10-CM | POA: Diagnosis not present

## 2021-05-20 DIAGNOSIS — R1311 Dysphagia, oral phase: Secondary | ICD-10-CM | POA: Diagnosis not present

## 2021-05-20 DIAGNOSIS — R279 Unspecified lack of coordination: Secondary | ICD-10-CM | POA: Diagnosis not present

## 2021-05-23 DIAGNOSIS — R279 Unspecified lack of coordination: Secondary | ICD-10-CM | POA: Diagnosis not present

## 2021-05-23 DIAGNOSIS — L89314 Pressure ulcer of right buttock, stage 4: Secondary | ICD-10-CM | POA: Diagnosis not present

## 2021-05-23 DIAGNOSIS — M6281 Muscle weakness (generalized): Secondary | ICD-10-CM | POA: Diagnosis not present

## 2021-05-23 DIAGNOSIS — Z8701 Personal history of pneumonia (recurrent): Secondary | ICD-10-CM | POA: Diagnosis not present

## 2021-05-23 DIAGNOSIS — L89310 Pressure ulcer of right buttock, unstageable: Secondary | ICD-10-CM | POA: Diagnosis not present

## 2021-05-23 DIAGNOSIS — M8618 Other acute osteomyelitis, other site: Secondary | ICD-10-CM | POA: Diagnosis not present

## 2021-05-23 DIAGNOSIS — R1311 Dysphagia, oral phase: Secondary | ICD-10-CM | POA: Diagnosis not present

## 2021-05-24 DIAGNOSIS — Z8701 Personal history of pneumonia (recurrent): Secondary | ICD-10-CM | POA: Diagnosis not present

## 2021-05-24 DIAGNOSIS — R279 Unspecified lack of coordination: Secondary | ICD-10-CM | POA: Diagnosis not present

## 2021-05-24 DIAGNOSIS — M6281 Muscle weakness (generalized): Secondary | ICD-10-CM | POA: Diagnosis not present

## 2021-05-24 DIAGNOSIS — L89310 Pressure ulcer of right buttock, unstageable: Secondary | ICD-10-CM | POA: Diagnosis not present

## 2021-05-24 DIAGNOSIS — M8618 Other acute osteomyelitis, other site: Secondary | ICD-10-CM | POA: Diagnosis not present

## 2021-05-24 DIAGNOSIS — R1311 Dysphagia, oral phase: Secondary | ICD-10-CM | POA: Diagnosis not present

## 2021-05-25 DIAGNOSIS — R279 Unspecified lack of coordination: Secondary | ICD-10-CM | POA: Diagnosis not present

## 2021-05-25 DIAGNOSIS — M6281 Muscle weakness (generalized): Secondary | ICD-10-CM | POA: Diagnosis not present

## 2021-05-25 DIAGNOSIS — L89310 Pressure ulcer of right buttock, unstageable: Secondary | ICD-10-CM | POA: Diagnosis not present

## 2021-05-25 DIAGNOSIS — Z8701 Personal history of pneumonia (recurrent): Secondary | ICD-10-CM | POA: Diagnosis not present

## 2021-05-25 DIAGNOSIS — R1311 Dysphagia, oral phase: Secondary | ICD-10-CM | POA: Diagnosis not present

## 2021-05-25 DIAGNOSIS — M8618 Other acute osteomyelitis, other site: Secondary | ICD-10-CM | POA: Diagnosis not present

## 2021-05-26 DIAGNOSIS — R1311 Dysphagia, oral phase: Secondary | ICD-10-CM | POA: Diagnosis not present

## 2021-05-26 DIAGNOSIS — M6281 Muscle weakness (generalized): Secondary | ICD-10-CM | POA: Diagnosis not present

## 2021-05-26 DIAGNOSIS — L89310 Pressure ulcer of right buttock, unstageable: Secondary | ICD-10-CM | POA: Diagnosis not present

## 2021-05-26 DIAGNOSIS — M8618 Other acute osteomyelitis, other site: Secondary | ICD-10-CM | POA: Diagnosis not present

## 2021-05-26 DIAGNOSIS — R279 Unspecified lack of coordination: Secondary | ICD-10-CM | POA: Diagnosis not present

## 2021-05-26 DIAGNOSIS — Z8701 Personal history of pneumonia (recurrent): Secondary | ICD-10-CM | POA: Diagnosis not present

## 2021-05-27 DIAGNOSIS — Z8701 Personal history of pneumonia (recurrent): Secondary | ICD-10-CM | POA: Diagnosis not present

## 2021-05-27 DIAGNOSIS — R1311 Dysphagia, oral phase: Secondary | ICD-10-CM | POA: Diagnosis not present

## 2021-05-27 DIAGNOSIS — R279 Unspecified lack of coordination: Secondary | ICD-10-CM | POA: Diagnosis not present

## 2021-05-27 DIAGNOSIS — M8618 Other acute osteomyelitis, other site: Secondary | ICD-10-CM | POA: Diagnosis not present

## 2021-05-27 DIAGNOSIS — M6281 Muscle weakness (generalized): Secondary | ICD-10-CM | POA: Diagnosis not present

## 2021-05-27 DIAGNOSIS — L89310 Pressure ulcer of right buttock, unstageable: Secondary | ICD-10-CM | POA: Diagnosis not present

## 2021-05-28 DIAGNOSIS — R1311 Dysphagia, oral phase: Secondary | ICD-10-CM | POA: Diagnosis not present

## 2021-05-28 DIAGNOSIS — R279 Unspecified lack of coordination: Secondary | ICD-10-CM | POA: Diagnosis not present

## 2021-05-28 DIAGNOSIS — Z8701 Personal history of pneumonia (recurrent): Secondary | ICD-10-CM | POA: Diagnosis not present

## 2021-05-28 DIAGNOSIS — L89310 Pressure ulcer of right buttock, unstageable: Secondary | ICD-10-CM | POA: Diagnosis not present

## 2021-05-28 DIAGNOSIS — M8618 Other acute osteomyelitis, other site: Secondary | ICD-10-CM | POA: Diagnosis not present

## 2021-05-28 DIAGNOSIS — M6281 Muscle weakness (generalized): Secondary | ICD-10-CM | POA: Diagnosis not present

## 2021-05-30 DIAGNOSIS — L89314 Pressure ulcer of right buttock, stage 4: Secondary | ICD-10-CM | POA: Diagnosis not present

## 2021-05-31 ENCOUNTER — Non-Acute Institutional Stay (SKILLED_NURSING_FACILITY): Payer: Medicare Other | Admitting: Internal Medicine

## 2021-05-31 ENCOUNTER — Encounter: Payer: Self-pay | Admitting: Internal Medicine

## 2021-05-31 DIAGNOSIS — G40909 Epilepsy, unspecified, not intractable, without status epilepticus: Secondary | ICD-10-CM | POA: Diagnosis not present

## 2021-05-31 DIAGNOSIS — D649 Anemia, unspecified: Secondary | ICD-10-CM | POA: Diagnosis not present

## 2021-05-31 DIAGNOSIS — I4819 Other persistent atrial fibrillation: Secondary | ICD-10-CM | POA: Diagnosis not present

## 2021-05-31 DIAGNOSIS — I10 Essential (primary) hypertension: Secondary | ICD-10-CM | POA: Diagnosis not present

## 2021-05-31 DIAGNOSIS — N183 Chronic kidney disease, stage 3 unspecified: Secondary | ICD-10-CM | POA: Diagnosis not present

## 2021-05-31 NOTE — Assessment & Plan Note (Signed)
Current H/H 10.7/32;prior H/H 10.2/31 Indices normochromic,normocytic  Anemia stable to improved  No bleeding dyscrasias reported by Staff Monitor CBC

## 2021-05-31 NOTE — Assessment & Plan Note (Signed)
Rate is adequately controlled at this time on the present regimen.

## 2021-05-31 NOTE — Assessment & Plan Note (Signed)
Current creatinine 1.01 / GFR 82.55 ; CKD Stage 2a Medication List reviewed.If CKD progresses Allopurinol will be weaned or discontinued.

## 2021-05-31 NOTE — Patient Instructions (Signed)
See assessment and plan under each diagnosis in the problem list and acutely for this visit 

## 2021-05-31 NOTE — Assessment & Plan Note (Signed)
Staff has not reported any seizure activity. No change indicated.

## 2021-05-31 NOTE — Assessment & Plan Note (Addendum)
I reviewed the blood pressure records over the past several weeks.  Today's blood pressure is an outlier. Frequently systolic blood pressures in the 90s have been noted. BP controlled; no change in antihypertensive medications

## 2021-05-31 NOTE — Progress Notes (Signed)
NURSING HOME LOCATION:  [facility] ROOM NUMBER:  106 A  CODE STATUS:  DNR  PCP:  Hoy Register MD  This is a nursing facility follow up visit of chronic medical diagnoses & to document compliance with Regulation 483.30 (c) in The Long Term Care Survey Manual Phase 2 which mandates caregiver visit ( visits can alternate among physician, PA or NP as per statutes) within 10 days of 30 days / 60 days/ 90 days post admission to SNF date    Interim medical record and care since last SNF visit was updated with review of diagnostic studies and change in clinical status since last visit were documented.  HPI: He is a permanent resident of the facility. June 6/10-6/15/22 he was admitted  with acute metabolic encephalopathy with clinical sepsis.  Course was complicated by AKI and AF with RVR with tachycardia up to 148.  This was associated with intermittent hypotension requiring fluid boli administration.  The sepsis syndrome was in the context of sacral wound and possible UTI.  Broad-spectrum antibiotics were initiated with transition to Rocephin and metronidazole until 6/24. The 6/18 admission was a code stroke.  Neurology consult postulated possible seizure prompting Depakote administration.  Imaging revealed no acute changes; bifrontal encephalomalacia was present.  AKI again was documented with creatinine up to 2.47.  Wound care was continued for stage IV sacral decubitus.  Most recent labs 7/5 revealed resolution of the AKI with a creatinine of 1.01 and GFR of 82.55.  Mild anemia was present but stable with values of 10.7/32.  Documented glucoses revealed mild hyperglycemia with a range of 127 up to 226.  Current A1c  6/10 revealed prediabetes with a value of 5.3%.  Review of systems: Dementia invalidated responses.  Answers to all review of systems questions was a negative shake of the head without verbalization.  Only when I asked about palpitations , did he respond "what  is palpitation".  He also made the comment "stronger on the left side" when I asked if there are any asymmetry to strength in the extremities.  Constitutional: No fever, significant weight change, fatigue  Eyes: No redness, discharge, pain, vision change ENT/mouth: No nasal congestion,  purulent discharge, earache, change in hearing, sore throat  Cardiovascular: No chest pain, palpitations, paroxysmal nocturnal dyspnea, claudication, edema  Respiratory: No cough, sputum production, hemoptysis, DOE, significant snoring, apnea   Gastrointestinal: No heartburn, dysphagia, abdominal pain, nausea /vomiting, rectal bleeding, melena, change in bowels Genitourinary: No dysuria, hematuria, pyuria, incontinence, nocturia Musculoskeletal: No joint stiffness, joint swelling, pain Dermatologic: No rash, pruritus, change in appearance of skin Neurologic: No dizziness, headache, syncope, seizures, numbness, tingling Psychiatric: No significant anxiety, depression, insomnia, anorexia Endocrine: No change in hair/skin/nails, excessive thirst, excessive hunger, excessive urination  Hematologic/lymphatic: No significant bruising, lymphadenopathy, abnormal bleeding Allergy/immunology: No itchy/watery eyes, significant sneezing, urticaria, angioedema  Physical exam:  Pertinent or positive findings: It was after 4:00 and he was still in bed asleep.  He was easily aroused.  Facies are blank.  As noted most of the interaction consisted of negative shakes of the head.  Hair is disheveled.  Dental hygiene is surprisingly good.  There is slight irregularity to the rhythm suggesting A. fib with controlled rate.  Trace ankle edema is present.  Pedal pulses are decreased.  Strength in all extremities to opposition was fair.  Any asymmetry in the strength from 1 side to the other was subtle at best.  General appearance: Adequately nourished; no acute distress,  increased work of breathing is present.   Lymphatic: No  lymphadenopathy about the head, neck, axilla. Eyes: No conjunctival inflammation or lid edema is present. There is no scleral icterus. Ears:  External ear exam shows no significant lesions or deformities.   Nose:  External nasal examination shows no deformity or inflammation. Nasal mucosa are pink and moist without lesions, exudates Oral exam:  Lips and gums are healthy appearing. There is no oropharyngeal erythema or exudate. Neck:  No thyromegaly, masses, tenderness noted.    Heart:  No without gallop, murmur, click, rub .  Lungs:  without wheezes, rhonchi, rales, rubs. Abdomen: Bowel sounds are normal. Abdomen is soft and nontender with no organomegaly, hernias, masses. GU: Deferred  Extremities:  No cyanosis, clubbing  Neurologic exam :Balance, Rhomberg, finger to nose testing could not be completed due to clinical state Skin: Warm & dry w/o tenting. No significant lesions or rash.  See summary under each active problem in the Problem List with associated updated therapeutic plan

## 2021-06-01 DIAGNOSIS — F4320 Adjustment disorder, unspecified: Secondary | ICD-10-CM | POA: Diagnosis not present

## 2021-06-13 DIAGNOSIS — L89314 Pressure ulcer of right buttock, stage 4: Secondary | ICD-10-CM | POA: Diagnosis not present

## 2021-06-18 ENCOUNTER — Other Ambulatory Visit: Payer: Self-pay | Admitting: Adult Health

## 2021-06-18 MED ORDER — TRAMADOL HCL 50 MG PO TABS: 25.0000 mg | ORAL_TABLET | Freq: Two times a day (BID) | ORAL | 0 refills | Status: DC

## 2021-06-20 DIAGNOSIS — L89314 Pressure ulcer of right buttock, stage 4: Secondary | ICD-10-CM | POA: Diagnosis not present

## 2021-06-23 DIAGNOSIS — B351 Tinea unguium: Secondary | ICD-10-CM | POA: Diagnosis not present

## 2021-06-23 DIAGNOSIS — M2042 Other hammer toe(s) (acquired), left foot: Secondary | ICD-10-CM | POA: Diagnosis not present

## 2021-06-23 DIAGNOSIS — L603 Nail dystrophy: Secondary | ICD-10-CM | POA: Diagnosis not present

## 2021-06-23 DIAGNOSIS — I739 Peripheral vascular disease, unspecified: Secondary | ICD-10-CM | POA: Diagnosis not present

## 2021-07-08 ENCOUNTER — Encounter: Payer: Self-pay | Admitting: Adult Health

## 2021-07-08 ENCOUNTER — Non-Acute Institutional Stay (SKILLED_NURSING_FACILITY): Payer: Medicare Other | Admitting: Adult Health

## 2021-07-08 DIAGNOSIS — R569 Unspecified convulsions: Secondary | ICD-10-CM | POA: Diagnosis not present

## 2021-07-08 DIAGNOSIS — D649 Anemia, unspecified: Secondary | ICD-10-CM

## 2021-07-08 DIAGNOSIS — F0150 Vascular dementia without behavioral disturbance: Secondary | ICD-10-CM | POA: Diagnosis not present

## 2021-07-08 DIAGNOSIS — I4819 Other persistent atrial fibrillation: Secondary | ICD-10-CM

## 2021-07-08 NOTE — Progress Notes (Signed)
Location:  [facility] Room Number: 106-A Place of Service:  SNF (31) Provider:  Kenard Gower, DNP, FNP-BC  Patient Care Team: Hoy Register, MD as PCP - General (Family Medicine) Regan Lemming, MD as PCP - Electrophysiology (Cardiology)  Extended Emergency Contact Information Primary Emergency Contact: Glendora Score [place] Home Phone: [phone] Relation: Daughter Secondary Emergency Contact: SAPP,RON Home Phone: [phone] Relation: Other  Code Status: DNR   Goals of care: Advanced Directive information Advanced Directives 07/08/2021  Does Patient Have a Medical Advance Directive? Yes  Type of Advance Directive Out of facility DNR (pink MOST or yellow form)  Does patient want to make changes to medical advance directive? No - Patient declined  Would patient like information on creating a medical advance directive? -  Pre-existing out of facility DNR order (yellow form or pink MOST form) -     Chief Complaint  Patient presents with   Medical Management of Chronic Issues    Routine Visit    HPI:  Pt is a 70 y.o. male seen today for medical management of chronic diseases.  He has a PMH of chronic atrial fibrillation, chronic kidney disease is stage III, hypertension, prediabetes, gout and sacral osteomyelitis.  He is a long-term care resident of [facility]. Latest hgb 10.7, 04/05/21.  He takes ferrous sulfate 325 mg twice daily for anemia.  HRs ranging from 70-78.  He takes diltiazem 90 mg every 8 hours for PAF.  Latest BIMS score 7/15, ranging in severe cognitive impairment.   Past Medical History:  Diagnosis Date   Chronic kidney disease    Chronic systolic CHF (congestive heart failure) (HCC)    EF 35-40, diffuse HK, mild MR, moderate LAE, mild RAE, PASP 44, L pleural eff   Dilated cardiomyopathy (HCC)    likely related to tachycardia   Dysrhythmia    Persistent atrial fibrillation  [facility])    Past Surgical History:  Procedure Laterality Date   CARDIOVERSION N/A 08/20/2017   Procedure: CARDIOVERSION;  Surgeon: [name], MD;  Location: [facility];  Service: Cardiovascular;  Laterality: N/A;   INCISION AND DRAINAGE ABSCESS Left 03/22/2014   Procedure: INCISION AND DRAINAGE ABSCESS LEFT BUTTOCK ABSCESS;  Surgeon: [name], MD;  Location: [facility] OR;  Service: General;  Laterality: Left;    No Known Allergies  Outpatient Encounter Medications as of 07/08/2021  Medication Sig   allopurinol (ZYLOPRIM) 300 MG tablet Take 150 mg by mouth daily.   Amino Acids-Protein Hydrolys (PRO-STAT PO) Take 30 mLs by mouth 2 (two) times daily.   apixaban (ELIQUIS) 5 MG TABS tablet Take 1 tablet (5 mg total) by mouth 2 (two) times daily.   atorvastatin (LIPITOR) 20 MG tablet Take 1 tablet (20 mg total) by mouth every evening.   bisacodyl (DULCOLAX) 10 MG suppository If not relieved by MOM, give 10 mg Bisacodyl suppositiory rectally X 1 dose in 24 hours as needed   colchicine 0.6 MG tablet Take 0.6 mg by mouth See admin instructions. 1.2 MG orally AT FIRST SIGN OF GOUT FLARE UP AND REPEAT 0.6 MG IN 1 HOUR IF SYMPTOMS PERSIST   diltiazem (CARDIZEM) 90 MG tablet Take 90 mg by mouth every 8 (eight) hours. Hold for SBP < 100 and Heart rate < 60   divalproex (DEPAKOTE SPRINKLE) 125 MG capsule Take 125 mg by mouth 2 (two) times daily.   feeding supplement (ENSURE ENLIVE / ENSURE PLUS) LIQD Take 237 mLs by mouth 2 (  two) times daily between meals.   ferrous sulfate 325 (65 FE) MG tablet Take 325 mg by mouth 2 (two) times daily with a meal.   Magnesium Hydroxide (MILK OF MAGNESIA PO) Take by mouth. If no BM in 3 days, give 30 cc Milk of Magnesium p.o. x 1 dose in 24 hours as needed (Do not use standing constipation orders for residents with renal failure CFR less than 30. Contact MD for orders) (Physician Order)   Menthol, Topical Analgesic, (BIOFREEZE) 4 % GEL Apply topically. APPLY TOPICALLY  TO LEFT SHOULDER THREE TIMES DAILY FOR PAIN   midodrine (PROAMATINE) 10 MG tablet Take 1 tablet (10 mg total) by mouth 3 (three) times daily with meals.   NON FORMULARY MAGIC CUP: ONE MAGIC CUP DAILY FOR WOUND HEALING   NON FORMULARY Diet: Regular/thin diet consistency   pantoprazole (PROTONIX) 40 MG tablet Take 1 tablet (40 mg total) by mouth daily.   polyethylene glycol (MIRALAX / GLYCOLAX) 17 g packet Take 17 g by mouth daily.   Sodium Phosphates (RA SALINE ENEMA RE) If not relieved by Biscodyl suppository, give disposable Saline Enema rectally X 1 dose/24 hrs as needed   tamsulosin (FLOMAX) 0.4 MG CAPS capsule Take 1 capsule (0.4 mg total) by mouth daily after supper.   traMADol (ULTRAM) 50 MG tablet Take 0.5 tablets (25 mg total) by mouth 2 (two) times daily.   vitamin C (ASCORBIC ACID) 500 MG tablet Take 500 mg by mouth 2 (two) times daily.   amiodarone (PACERONE) 400 MG tablet Take 400 mg by mouth daily. 400 mg TWICE A DAY FOR TWO WEEKS (05/18/2021-05/31/2021) 400 mg daily for 2 weeks (05/31/2021-06/14/2021 200 mg daily 09/14   No facility-administered encounter medications on file as of 07/08/2021.    Review of Systems unable to obtain due to cognitive impairment.   Immunization History  Administered Date(s) Administered   DTaP 12/14/2020   PFIZER(Purple Top)SARS-COV-2 Vaccination 12/07/2020   Pneumococcal Conjugate-13 10/28/2018   Pneumococcal Polysaccharide-23 09/08/2020   Tdap 12/14/2020   Pertinent  Health Maintenance Due  Topic Date Due   COLONOSCOPY (Pts 45-67yrs Insurance coverage will need to be confirmed)  Never done   INFLUENZA VACCINE  Never done   Fall Risk  03/31/2021 01/27/2021 09/08/2020 06/08/2020 10/28/2018  Falls in the past year? 0 1 0 0 0  Number falls in past yr: - 0 0 - -  Injury with Fall? - 1 0 - -  Risk for fall due to : Impaired balance/gait;Impaired mobility History of fall(s);Impaired balance/gait;Impaired mobility - No Fall Risks -  Follow up Falls  evaluation completed Falls evaluation completed - - -     Vitals:   07/08/21 1041  BP: 125/88  Pulse: 72  Resp: 18  Temp: 97.8 F (36.6 C)  Weight: 185 lb 3.2 oz (84 kg)  Height: 6' (1.829 m)   Body mass index is 25.12 kg/m.  Physical Exam  GENERAL APPEARANCE: Well nourished. In no acute distress. Normal body habitus SKIN:  Right buttock pressure ulcer stage IV with dressing MOUTH and THROAT: Lips are without lesions. Oral mucosa is moist and without lesions.  RESPIRATORY: Breathing is even & unlabored, BS CTAB CARDIAC: RRR, no murmur,no extra heart sounds, no edema GI: Abdomen soft, normal BS, no masses, no tenderness NEUROLOGICAL: There is no tremor. Speech is clear. Alert to self, disoriented to time and place. PSYCHIATRIC:  Affect and behavior are appropriate  Labs reviewed: Recent Labs    11/10/20 1508 11/10/20 2000 03/15/21 0348  03/16/21 0320 03/19/21 2115 03/20/21 0429 03/21/21 0501 03/22/21 0445 03/23/21 0500 03/25/21 0908 03/29/21 0000 04/05/21 0000  NA 167*   < > 140 143   < > 142   < > 140 139 137 146 139  K 4.1   < > 3.6 3.9   < > 4.7   < > 4.5 4.4 4.2 4.8 4.0  CL >130*   < > 106 106   < > 108   < > 102 104 103 107 102  CO2 20*   < > 26 28   < > 26   < > 28 27 24  26* 25*  GLUCOSE 152*   < > 105* 102*   < > 109*   < > 114* 127* 151*  --   --   BUN 85*   < > 23 22   < > 30*   < > 33* 33* 30* 24* 13  CREATININE 2.58*   < > 1.16 1.30*   < > 2.16*   < > 2.52* 2.47* 2.29* 1.4* 1.0  CALCIUM 8.5*   < > 8.5* 8.7*   < > 8.7*   < > 8.6* 8.5* 8.6* 8.9 9.0  MG  --    < > 1.9 1.9  --  2.1  --   --   --   --   --   --   PHOS 3.5  --   --   --   --  4.9*  --   --   --   --   --   --    < > = values in this interval not displayed.   Recent Labs    03/16/21 0320 03/19/21 2115 03/20/21 0429  AST 13* 11* 10*  ALT 9 10 8   ALKPHOS 41 44 44  BILITOT 0.5 0.4 0.7  PROT 5.0* 5.4* 5.6*  ALBUMIN 2.0* 2.0* 2.0*   Recent Labs    03/22/21 0445 03/23/21 0500  03/25/21 0908 03/29/21 0000 04/05/21 0000  WBC 13.4* 13.7* 11.0* 8.9 6.6  NEUTROABS 10.8* 11.0* 8.8*  --   --   HGB 9.6* 9.9* 10.7* 10.2* 10.7*  HCT 30.3* 31.4* 35.7* 31* 32*  MCV 84.4 84.2 88.1  --   --   PLT 440* 464* 481* 426* 359   Lab Results  Component Value Date   TSH 5.052 (H) 03/21/2021   Lab Results  Component Value Date   HGBA1C 5.3 03/11/2021   Lab Results  Component Value Date   CHOL 95 03/07/2021   HDL 24 (A) 03/07/2021   LDLCALC 44 03/07/2021   TRIG 137 03/07/2021   CHOLHDL 3.6 10/28/2018    Significant Diagnostic Results in last 30 days:  No results found.  Assessment/Plan  1. Normochromic normocytic anemia Lab Results  Component Value Date   HGB 10.7 (A) 04/05/2021   -Continue ferrous sulfate  2. Persistent atrial fibrillation (HCC) -   Rate controlled, continue diltiazem and amiodarone for rate control and Eliquis for anticoagulation  3. Seizure-like activity (HCC) -  no recent seizures, or continue Depakote  4. Vascular dementia without behavioral disturbance (HCC) -  BIMS score 7/15, ranging in severe cognitive impairment -   Continue supportive care     Family/ staff Communication:   Discussed plan of care with resident and charge nurse.  Labs/tests ordered:   None  Goals of care:   Long-term care   06/06/2021, DNP, MSN, FNP-BC [facility] [phone] (Monday-Friday  8:00 a.m. - 5:00 p.m.) [phone] (after hours)

## 2021-07-11 DIAGNOSIS — L89314 Pressure ulcer of right buttock, stage 4: Secondary | ICD-10-CM | POA: Diagnosis not present

## 2021-07-18 ENCOUNTER — Other Ambulatory Visit: Payer: Self-pay | Admitting: Adult Health

## 2021-07-18 MED ORDER — TRAMADOL HCL 50 MG PO TABS: 25.0000 mg | ORAL_TABLET | Freq: Two times a day (BID) | ORAL | 0 refills | Status: DC

## 2021-07-20 DIAGNOSIS — I4891 Unspecified atrial fibrillation: Secondary | ICD-10-CM | POA: Diagnosis not present

## 2021-07-20 DIAGNOSIS — E870 Hyperosmolality and hypernatremia: Secondary | ICD-10-CM | POA: Diagnosis not present

## 2021-07-20 DIAGNOSIS — I129 Hypertensive chronic kidney disease with stage 1 through stage 4 chronic kidney disease, or unspecified chronic kidney disease: Secondary | ICD-10-CM | POA: Diagnosis not present

## 2021-07-20 DIAGNOSIS — I5022 Chronic systolic (congestive) heart failure: Secondary | ICD-10-CM | POA: Diagnosis not present

## 2021-07-20 DIAGNOSIS — Z206 Contact with and (suspected) exposure to human immunodeficiency virus [HIV]: Secondary | ICD-10-CM | POA: Diagnosis not present

## 2021-07-20 LAB — BASIC METABOLIC PANEL
BUN: 25 — AB (ref 4–21)
CO2: 28 — AB (ref 13–22)
Chloride: 105 (ref 99–108)
Creatinine: 1.3 (ref 0.6–1.3)
Glucose: 83
Potassium: 4.3 (ref 3.4–5.3)
Sodium: 142 (ref 137–147)

## 2021-07-20 LAB — COMPREHENSIVE METABOLIC PANEL
Calcium: 9.3 (ref 8.7–10.7)
GFR calc Af Amer: 67.04
GFR calc non Af Amer: 57.84

## 2021-07-25 DIAGNOSIS — I5022 Chronic systolic (congestive) heart failure: Secondary | ICD-10-CM | POA: Diagnosis not present

## 2021-07-25 DIAGNOSIS — G9341 Metabolic encephalopathy: Secondary | ICD-10-CM | POA: Diagnosis not present

## 2021-07-25 DIAGNOSIS — M6281 Muscle weakness (generalized): Secondary | ICD-10-CM | POA: Diagnosis not present

## 2021-07-25 DIAGNOSIS — M4628 Osteomyelitis of vertebra, sacral and sacrococcygeal region: Secondary | ICD-10-CM | POA: Diagnosis not present

## 2021-07-25 DIAGNOSIS — R41841 Cognitive communication deficit: Secondary | ICD-10-CM | POA: Diagnosis not present

## 2021-07-25 DIAGNOSIS — R1311 Dysphagia, oral phase: Secondary | ICD-10-CM | POA: Diagnosis not present

## 2021-07-27 DIAGNOSIS — I5022 Chronic systolic (congestive) heart failure: Secondary | ICD-10-CM | POA: Diagnosis not present

## 2021-07-27 DIAGNOSIS — R41841 Cognitive communication deficit: Secondary | ICD-10-CM | POA: Diagnosis not present

## 2021-07-27 DIAGNOSIS — G9341 Metabolic encephalopathy: Secondary | ICD-10-CM | POA: Diagnosis not present

## 2021-07-27 DIAGNOSIS — M6281 Muscle weakness (generalized): Secondary | ICD-10-CM | POA: Diagnosis not present

## 2021-07-27 DIAGNOSIS — R1311 Dysphagia, oral phase: Secondary | ICD-10-CM | POA: Diagnosis not present

## 2021-07-27 DIAGNOSIS — M4628 Osteomyelitis of vertebra, sacral and sacrococcygeal region: Secondary | ICD-10-CM | POA: Diagnosis not present

## 2021-07-28 DIAGNOSIS — I5022 Chronic systolic (congestive) heart failure: Secondary | ICD-10-CM | POA: Diagnosis not present

## 2021-07-28 DIAGNOSIS — M4628 Osteomyelitis of vertebra, sacral and sacrococcygeal region: Secondary | ICD-10-CM | POA: Diagnosis not present

## 2021-07-28 DIAGNOSIS — G9341 Metabolic encephalopathy: Secondary | ICD-10-CM | POA: Diagnosis not present

## 2021-07-28 DIAGNOSIS — M6281 Muscle weakness (generalized): Secondary | ICD-10-CM | POA: Diagnosis not present

## 2021-07-28 DIAGNOSIS — R1311 Dysphagia, oral phase: Secondary | ICD-10-CM | POA: Diagnosis not present

## 2021-07-28 DIAGNOSIS — R41841 Cognitive communication deficit: Secondary | ICD-10-CM | POA: Diagnosis not present

## 2021-07-30 DIAGNOSIS — M6281 Muscle weakness (generalized): Secondary | ICD-10-CM | POA: Diagnosis not present

## 2021-07-30 DIAGNOSIS — G9341 Metabolic encephalopathy: Secondary | ICD-10-CM | POA: Diagnosis not present

## 2021-07-30 DIAGNOSIS — R41841 Cognitive communication deficit: Secondary | ICD-10-CM | POA: Diagnosis not present

## 2021-07-30 DIAGNOSIS — R1311 Dysphagia, oral phase: Secondary | ICD-10-CM | POA: Diagnosis not present

## 2021-07-30 DIAGNOSIS — M4628 Osteomyelitis of vertebra, sacral and sacrococcygeal region: Secondary | ICD-10-CM | POA: Diagnosis not present

## 2021-07-30 DIAGNOSIS — I5022 Chronic systolic (congestive) heart failure: Secondary | ICD-10-CM | POA: Diagnosis not present

## 2021-08-01 DIAGNOSIS — G9341 Metabolic encephalopathy: Secondary | ICD-10-CM | POA: Diagnosis not present

## 2021-08-01 DIAGNOSIS — R1311 Dysphagia, oral phase: Secondary | ICD-10-CM | POA: Diagnosis not present

## 2021-08-01 DIAGNOSIS — R41841 Cognitive communication deficit: Secondary | ICD-10-CM | POA: Diagnosis not present

## 2021-08-01 DIAGNOSIS — M6281 Muscle weakness (generalized): Secondary | ICD-10-CM | POA: Diagnosis not present

## 2021-08-01 DIAGNOSIS — I5022 Chronic systolic (congestive) heart failure: Secondary | ICD-10-CM | POA: Diagnosis not present

## 2021-08-01 DIAGNOSIS — M4628 Osteomyelitis of vertebra, sacral and sacrococcygeal region: Secondary | ICD-10-CM | POA: Diagnosis not present

## 2021-08-02 DIAGNOSIS — M6281 Muscle weakness (generalized): Secondary | ICD-10-CM | POA: Diagnosis not present

## 2021-08-02 DIAGNOSIS — I5022 Chronic systolic (congestive) heart failure: Secondary | ICD-10-CM | POA: Diagnosis not present

## 2021-08-03 DIAGNOSIS — I5022 Chronic systolic (congestive) heart failure: Secondary | ICD-10-CM | POA: Diagnosis not present

## 2021-08-03 DIAGNOSIS — M6281 Muscle weakness (generalized): Secondary | ICD-10-CM | POA: Diagnosis not present

## 2021-08-04 DIAGNOSIS — I5022 Chronic systolic (congestive) heart failure: Secondary | ICD-10-CM | POA: Diagnosis not present

## 2021-08-04 DIAGNOSIS — M6281 Muscle weakness (generalized): Secondary | ICD-10-CM | POA: Diagnosis not present

## 2021-08-05 DIAGNOSIS — I5022 Chronic systolic (congestive) heart failure: Secondary | ICD-10-CM | POA: Diagnosis not present

## 2021-08-05 DIAGNOSIS — M6281 Muscle weakness (generalized): Secondary | ICD-10-CM | POA: Diagnosis not present

## 2021-08-08 ENCOUNTER — Encounter: Payer: Self-pay | Admitting: Adult Health

## 2021-08-08 ENCOUNTER — Non-Acute Institutional Stay (SKILLED_NURSING_FACILITY): Payer: Medicare Other | Admitting: Adult Health

## 2021-08-08 DIAGNOSIS — I4819 Other persistent atrial fibrillation: Secondary | ICD-10-CM | POA: Diagnosis not present

## 2021-08-08 DIAGNOSIS — U071 COVID-19: Secondary | ICD-10-CM

## 2021-08-08 NOTE — Progress Notes (Signed)
Location:  [place] Room Number: 106-A Place of Service:  SNF (31) Provider:  Durenda Age, DNP, FNP-BC  Patient Care Team: Charlott Rakes, MD as PCP - General (Family Medicine) Constance Haw, MD as PCP - Electrophysiology (Cardiology)  Extended Emergency Contact Information Primary Emergency Contact: Cherylann Parr [place] of [place] Phone: [phone] Relation: Daughter Secondary Emergency Contact: SAPP,RON Home Phone: [phone] Relation: Other  Code Status: DNR   Goals of care: Advanced Directive information Advanced Directives 08/08/2021  Does Patient Have a Medical Advance Directive? Yes  Type of Advance Directive Out of facility DNR (pink MOST or yellow form)  Does patient want to make changes to medical advance directive? No - Patient declined  Would patient like information on creating a medical advance directive? -  Pre-existing out of facility DNR order (yellow form or pink MOST form) -     Chief Complaint  Patient presents with   Acute Visit    COVID positive    HPI:  Pt is a 70 y.o. male seen today for an acute visit due to having positive COVID-19 test during a routine testing. There was no reported fever nor chills. He has 1 COVID-19 vaccine. Hew denies any pain. Latest BIMS score 7/15, ranging in severe cognitive impairment.     Past Medical History:  Diagnosis Date   Chronic kidney disease    Chronic systolic CHF (congestive heart failure) (HCC)    EF 35-40, diffuse HK, mild MR, moderate LAE, mild RAE, PASP 44, L pleural eff   Dilated cardiomyopathy ([place])    likely related to tachycardia   Dysrhythmia    Persistent atrial fibrillation [facility])    Past Surgical History:  Procedure Laterality Date   CARDIOVERSION N/A 08/20/2017   Procedure: CARDIOVERSION;  Surgeon: Jerline Pain, MD;  Location: [place];  Service: Cardiovascular;  Laterality: N/A;   INCISION AND DRAINAGE ABSCESS Left 03/22/2014    Procedure: INCISION AND DRAINAGE ABSCESS LEFT BUTTOCK ABSCESS;  Surgeon: Zenovia Jarred, MD;  Location: [place];  Service: General;  Laterality: Left;    No Known Allergies  Outpatient Encounter Medications as of 08/08/2021  Medication Sig   allopurinol (ZYLOPRIM) 300 MG tablet Take 150 mg by mouth daily.   Amino Acids-Protein Hydrolys (PRO-STAT PO) Take 30 mLs by mouth 2 (two) times daily.   apixaban (ELIQUIS) 5 MG TABS tablet Take 1 tablet (5 mg total) by mouth 2 (two) times daily.   atorvastatin (LIPITOR) 20 MG tablet Take 1 tablet (20 mg total) by mouth every evening.   bisacodyl (DULCOLAX) 10 MG suppository If not relieved by MOM, give 10 mg Bisacodyl suppositiory rectally X 1 dose in 24 hours as needed   colchicine 0.6 MG tablet Take 0.6 mg by mouth See admin instructions. 1.2 MG orally AT FIRST SIGN OF GOUT FLARE UP AND REPEAT 0.6 MG IN 1 HOUR IF SYMPTOMS PERSIST   diltiazem (CARDIZEM) 90 MG tablet Take 90 mg by mouth every 8 (eight) hours. Hold for SBP < 100 and Heart rate < 60   divalproex (DEPAKOTE SPRINKLE) 125 MG capsule Take 125 mg by mouth 2 (two) times daily.   feeding supplement (ENSURE ENLIVE / ENSURE PLUS) LIQD Take 237 mLs by mouth 2 (two) times daily between meals.   ferrous sulfate 325 (65 FE) MG tablet Take 325 mg by mouth 2 (two) times daily with a meal.   Magnesium Hydroxide (MILK OF MAGNESIA PO) Take by mouth. If no BM in 3  days, give 30 cc Milk of Magnesium p.o. x 1 dose in 24 hours as needed (Do not use standing constipation orders for residents with renal failure CFR less than 30. Contact MD for orders) (Physician Order)   Menthol, Topical Analgesic, (BIOFREEZE) 4 % GEL Apply topically. APPLY TOPICALLY TO LEFT SHOULDER THREE TIMES DAILY FOR PAIN   midodrine (PROAMATINE) 10 MG tablet Take 1 tablet (10 mg total) by mouth 3 (three) times daily with meals.   NON FORMULARY MAGIC CUP: ONE MAGIC CUP DAILY FOR WOUND HEALING   NON FORMULARY Diet: Regular/thin diet  consistency   pantoprazole (PROTONIX) 40 MG tablet Take 1 tablet (40 mg total) by mouth daily.   polyethylene glycol (MIRALAX / GLYCOLAX) 17 g packet Take 17 g by mouth daily.   Sodium Phosphates (RA SALINE ENEMA RE) If not relieved by Biscodyl suppository, give disposable Saline Enema rectally X 1 dose/24 hrs as needed   tamsulosin (FLOMAX) 0.4 MG CAPS capsule Take 1 capsule (0.4 mg total) by mouth daily after supper.   traMADol (ULTRAM) 50 MG tablet Take 0.5 tablets (25 mg total) by mouth 2 (two) times daily.   vitamin C (ASCORBIC ACID) 500 MG tablet Take 500 mg by mouth 2 (two) times daily.   amiodarone (PACERONE) 400 MG tablet Take 400 mg by mouth daily. 400 mg TWICE A DAY FOR TWO WEEKS (05/18/2021-05/31/2021) 400 mg daily for 2 weeks (05/31/2021-06/14/2021 200 mg daily 09/14   No facility-administered encounter medications on file as of 08/08/2021.    Review of Systems  GENERAL: No fever or chills  MOUTH and THROAT: Denies oral discomfort, gingival pain or bleeding RESPIRATORY: no cough, SOB, DOE, wheezing, hemoptysis CARDIAC: No chest pain or palpitations GI: No abdominal pain, diarrhea, constipation, heart burn, nausea or vomiting GU: Denies dysuria, frequency, hematuria or discharge NEUROLOGICAL: Denies dizziness, syncope, numbness, or headache PSYCHIATRIC: Denies feelings of depression or anxiety. No report of hallucinations, insomnia, paranoia, or agitation   Immunization History  Administered Date(s) Administered   DTaP 12/14/2020   PFIZER(Purple Top)SARS-COV-2 Vaccination 12/07/2020   Pneumococcal Conjugate-13 10/28/2018   Pneumococcal Polysaccharide-23 09/08/2020   Tdap 12/14/2020   Pertinent  Health Maintenance Due  Topic Date Due   COLONOSCOPY (Pts 45-64yrs Insurance coverage will need to be confirmed)  Never done   INFLUENZA VACCINE  Never done   Fall Risk 03/22/2021 03/23/2021 03/25/2021 03/25/2021 03/31/2021  Falls in the past year? - - - - 0  Was there an injury  with Fall? - - - - -  Fall Risk Category Calculator - - - - -  Fall Risk Category - - - - -  Patient Fall Risk Level High fall risk High fall risk High fall risk High fall risk High fall risk  Patient at Risk for Falls Due to - - - - Impaired balance/gait;Impaired mobility  Fall risk Follow up - - - - Falls evaluation completed     Vitals:   08/08/21 1106  BP: 110/65  Pulse: 68  Resp: 18  Temp: (!) 97.5 F (36.4 C)  Weight: 195 lb 9.6 oz (88.7 kg)  Height: 6' (1.829 m)   Body mass index is 26.53 kg/m.  Physical Exam  GENERAL APPEARANCE: Well nourished. In no acute distress. Normal body habitus SKIN:  Skin is warm and dry.  MOUTH and THROAT: Lips are without lesions. Oral mucosa is moist and without lesions.  RESPIRATORY: Breathing is even & unlabored, BS CTAB CARDIAC: Irregular heart rhythm, no murmur,no extra heart sounds GI:  Abdomen soft, normal BS, no masses, no tenderness NEUROLOGICAL: There is no tremor. Speech is clear. Alert to self, disoriented to time and place. PSYCHIATRIC:  Affect and behavior are appropriate  Labs reviewed: Recent Labs    11/10/20 1508 11/10/20 2000 03/15/21 0348 03/16/21 0320 03/19/21 2115 03/20/21 0429 03/21/21 0501 03/22/21 0445 03/23/21 0500 03/25/21 0908 03/29/21 0000 04/05/21 0000  NA 167*   < > 140 143   < > 142   < > 140 139 137 146 139  K 4.1   < > 3.6 3.9   < > 4.7   < > 4.5 4.4 4.2 4.8 4.0  CL >130*   < > 106 106   < > 108   < > 102 104 103 107 102  CO2 20*   < > 26 28   < > 26   < > 28 27 24  26* 25*  GLUCOSE 152*   < > 105* 102*   < > 109*   < > 114* 127* 151*  --   --   BUN 85*   < > 23 22   < > 30*   < > 33* 33* 30* 24* 13  CREATININE 2.58*   < > 1.16 1.30*   < > 2.16*   < > 2.52* 2.47* 2.29* 1.4* 1.0  CALCIUM 8.5*   < > 8.5* 8.7*   < > 8.7*   < > 8.6* 8.5* 8.6* 8.9 9.0  MG  --    < > 1.9 1.9  --  2.1  --   --   --   --   --   --   PHOS 3.5  --   --   --   --  4.9*  --   --   --   --   --   --    < > = values in this  interval not displayed.   Recent Labs    03/16/21 0320 03/19/21 2115 03/20/21 0429  AST 13* 11* 10*  ALT 9 10 8   ALKPHOS 41 44 44  BILITOT 0.5 0.4 0.7  PROT 5.0* 5.4* 5.6*  ALBUMIN 2.0* 2.0* 2.0*   Recent Labs    03/22/21 0445 03/23/21 0500 03/25/21 0908 03/29/21 0000 04/05/21 0000  WBC 13.4* 13.7* 11.0* 8.9 6.6  NEUTROABS 10.8* 11.0* 8.8*  --   --   HGB 9.6* 9.9* 10.7* 10.2* 10.7*  HCT 30.3* 31.4* 35.7* 31* 32*  MCV 84.4 84.2 88.1  --   --   PLT 440* 464* 481* 426* 359   Lab Results  Component Value Date   TSH 5.052 (H) 03/21/2021   Lab Results  Component Value Date   HGBA1C 5.3 03/11/2021   Lab Results  Component Value Date   CHOL 95 03/07/2021   HDL 24 (A) 03/07/2021   LDLCALC 44 03/07/2021   TRIG 137 03/07/2021   CHOLHDL 3.6 10/28/2018    Significant Diagnostic Results in last 30 days:  No results found.  Assessment/Plan  1. COVID-19 virus infection -  will start on Molnupiravir 800 mg PO every 12 hours X 5 days -  Zinc sulfate 220 mg 1 capsule PO daily X 7 days and Vitamin D3 2,000 units PO daily -  continue Vitamin C 500 mg BID  2. Persistent atrial fibrillation (HCC) -  rate-controlled, continue Eliquis 5 mg daily for anticoagulation, Diltiazem 90 mg PO every 8 hours and Amiodarone 200 mg PO daily for rate-control  Family/ staff Communication:  Discussed plan of care with resident and charge nurse.  Labs/tests ordered:   CBC with differentials, BMP with GFR and chest x-ray  Goals of care:   Long-term care   Durenda Age, DNP, MSN, FNP-BC [facility] [phone] (Monday-Friday 8:00 a.m. - 5:00 p.m.) [phone] (after hours)

## 2021-08-10 ENCOUNTER — Non-Acute Institutional Stay (SKILLED_NURSING_FACILITY): Payer: Medicare Other | Admitting: Adult Health

## 2021-08-10 ENCOUNTER — Encounter: Payer: Self-pay | Admitting: Adult Health

## 2021-08-10 ENCOUNTER — Other Ambulatory Visit: Payer: Self-pay | Admitting: Adult Health

## 2021-08-10 DIAGNOSIS — D649 Anemia, unspecified: Secondary | ICD-10-CM

## 2021-08-10 DIAGNOSIS — I4819 Other persistent atrial fibrillation: Secondary | ICD-10-CM | POA: Diagnosis not present

## 2021-08-10 DIAGNOSIS — F0150 Vascular dementia without behavioral disturbance: Secondary | ICD-10-CM | POA: Diagnosis not present

## 2021-08-10 DIAGNOSIS — U071 COVID-19: Secondary | ICD-10-CM

## 2021-08-10 DIAGNOSIS — Z7189 Other specified counseling: Secondary | ICD-10-CM | POA: Diagnosis not present

## 2021-08-10 DIAGNOSIS — I9589 Other hypotension: Secondary | ICD-10-CM

## 2021-08-10 DIAGNOSIS — I2510 Atherosclerotic heart disease of native coronary artery without angina pectoris: Secondary | ICD-10-CM | POA: Diagnosis not present

## 2021-08-10 NOTE — Progress Notes (Addendum)
Location:  [place] Room Number: 106 A Place of Service:  SNF (31) Provider:  Durenda Age, DNP, FNP-BC  Patient Care Team: Charlott Rakes, MD as PCP - General (Family Medicine) Constance Haw, MD as PCP - Electrophysiology (Cardiology)  Extended Emergency Contact Information Primary Emergency Contact: Cherylann Parr States of [place] Phone: [phone] Relation: Daughter Secondary Emergency Contact: SAPP,RON Home Phone: [phone] Relation: Other  Code Status:  DNR  Goals of care: Advanced Directive information Advanced Directives 08/10/2021  Does Patient Have a Medical Advance Directive? Yes  Type of Advance Directive Out of facility DNR (pink MOST or yellow form)  Does patient want to make changes to medical advance directive? No - Patient declined  Would patient like information on creating a medical advance directive? -  Pre-existing out of facility DNR order (yellow form or pink MOST form) -     Chief Complaint  Patient presents with   Acute Visit    Advance care planning    HPI:  Pt is a 70 y.o. male who had a care plan meeting today attended by NP, social worker and dietary. Daughter was called via telephone conference but did not answer. Social will call later to update daughter regarding the meeting. He remains to be DNR. Discussed medications, vital signs and weights. He continues to take Molnupiravir 800 mg PO every 12 hours X 5 days total due to being positive for COVID-19 rapid test. No fever, cough nor congestion. He remains to be in droplet isolation. He participates in restorative AROM of BUE and BLE. The meeting lasted for 20 minutes.    Past Medical History:  Diagnosis Date   Chronic kidney disease    Chronic systolic CHF (congestive heart failure) (HCC)    EF 35-40, diffuse HK, mild MR, moderate LAE, mild RAE, PASP 44, L pleural eff   Dilated cardiomyopathy ([place])    likely related to tachycardia    Dysrhythmia    Persistent atrial fibrillation [facility])    Past Surgical History:  Procedure Laterality Date   CARDIOVERSION N/A 08/20/2017   Procedure: CARDIOVERSION;  Surgeon: Jerline Pain, MD;  Location: [place];  Service: Cardiovascular;  Laterality: N/A;   INCISION AND DRAINAGE ABSCESS Left 03/22/2014   Procedure: INCISION AND DRAINAGE ABSCESS LEFT BUTTOCK ABSCESS;  Surgeon: Zenovia Jarred, MD;  Location: [place];  Service: General;  Laterality: Left;    No Known Allergies  Outpatient Encounter Medications as of 08/10/2021  Medication Sig   allopurinol (ZYLOPRIM) 300 MG tablet Take 150 mg by mouth daily.   Amino Acids-Protein Hydrolys (PRO-STAT PO) Take 30 mLs by mouth 2 (two) times daily.   amiodarone (PACERONE) 400 MG tablet Take 400 mg by mouth daily. 400 mg TWICE A DAY FOR TWO WEEKS (05/18/2021-05/31/2021) 400 mg daily for 2 weeks (05/31/2021-06/14/2021 200 mg daily 09/14   apixaban (ELIQUIS) 5 MG TABS tablet Take 1 tablet (5 mg total) by mouth 2 (two) times daily.   atorvastatin (LIPITOR) 20 MG tablet Take 1 tablet (20 mg total) by mouth every evening.   Cholecalciferol (VITAMIN D) 50 MCG (2000 UT) tablet Take 2,000 Units by mouth daily.   colchicine 0.6 MG tablet Take 0.6 mg by mouth See admin instructions. 1.2 MG orally AT FIRST SIGN OF GOUT FLARE UP AND REPEAT 0.6 MG IN 1 HOUR IF SYMPTOMS PERSIST   diltiazem (CARDIZEM) 90 MG tablet Take 90 mg by mouth every 8 (eight) hours. Hold for SBP < 100 and Heart  rate < 60   divalproex (DEPAKOTE SPRINKLE) 125 MG capsule Take 125 mg by mouth 2 (two) times daily.   feeding supplement (ENSURE ENLIVE / ENSURE PLUS) LIQD Take 237 mLs by mouth 2 (two) times daily between meals.   ferrous sulfate 325 (65 FE) MG tablet Take 325 mg by mouth 2 (two) times daily with a meal.   Menthol, Topical Analgesic, (BIOFREEZE) 4 % GEL Apply topically. APPLY TOPICALLY TO LEFT SHOULDER THREE TIMES DAILY FOR PAIN   midodrine (PROAMATINE) 10 MG tablet Take 1  tablet (10 mg total) by mouth 3 (three) times daily with meals.   molnupiravir EUA (LAGEVRIO) 200 MG CAPS capsule Take 4 capsules by mouth 2 (two) times daily.   NON FORMULARY MAGIC CUP: ONE MAGIC CUP DAILY FOR WOUND HEALING   NON FORMULARY Diet: Regular/thin diet consistency   pantoprazole (PROTONIX) 40 MG tablet Take 1 tablet (40 mg total) by mouth daily.   polyethylene glycol (MIRALAX / GLYCOLAX) 17 g packet Take 17 g by mouth daily.   tamsulosin (FLOMAX) 0.4 MG CAPS capsule Take 1 capsule (0.4 mg total) by mouth daily after supper.   traMADol (ULTRAM) 50 MG tablet Take 0.5 tablets (25 mg total) by mouth 2 (two) times daily.   vitamin C (ASCORBIC ACID) 500 MG tablet Take 500 mg by mouth 2 (two) times daily.   zinc sulfate 220 (50 Zn) MG capsule Take 220 mg by mouth daily.   [DISCONTINUED] bisacodyl (DULCOLAX) 10 MG suppository If not relieved by MOM, give 10 mg Bisacodyl suppositiory rectally X 1 dose in 24 hours as needed   [DISCONTINUED] Magnesium Hydroxide (MILK OF MAGNESIA PO) Take by mouth. If no BM in 3 days, give 30 cc Milk of Magnesium p.o. x 1 dose in 24 hours as needed (Do not use standing constipation orders for residents with renal failure CFR less than 30. Contact MD for orders) (Physician Order)   [DISCONTINUED] Sodium Phosphates (RA SALINE ENEMA RE) If not relieved by Biscodyl suppository, give disposable Saline Enema rectally X 1 dose/24 hrs as needed   No facility-administered encounter medications on file as of 08/10/2021.    Review of Systems  GENERAL: No fever or chills  MOUTH and THROAT: Denies oral discomfort, gingival pain or bleeding RESPIRATORY: no cough, SOB, DOE, wheezing, hemoptysis CARDIAC: No chest pain or palpitations GI: No abdominal pain, diarrhea, constipation, heart burn, nausea or vomiting GU: Denies dysuria, frequency, hematuria or discharge NEUROLOGICAL: Denies dizziness, syncope, numbness, or headache PSYCHIATRIC: Denies feelings of depression or  anxiety. No report of hallucinations, insomnia, paranoia, or agitation   Immunization History  Administered Date(s) Administered   DTaP 12/14/2020   PFIZER(Purple Top)SARS-COV-2 Vaccination 12/07/2020   Pneumococcal Conjugate-13 10/28/2018   Pneumococcal Polysaccharide-23 09/08/2020   Tdap 12/14/2020   Pertinent  Health Maintenance Due  Topic Date Due   COLONOSCOPY (Pts 45-69yrs Insurance coverage will need to be confirmed)  Never done   INFLUENZA VACCINE  Never done   Fall Risk 03/22/2021 03/23/2021 03/25/2021 03/25/2021 03/31/2021  Falls in the past year? - - - - 0  Was there an injury with Fall? - - - - -  Fall Risk Category Calculator - - - - -  Fall Risk Category - - - - -  Patient Fall Risk Level High fall risk High fall risk High fall risk High fall risk High fall risk  Patient at Risk for Falls Due to - - - - Impaired balance/gait;Impaired mobility  Fall risk Follow up - - - -  Falls evaluation completed     Vitals:   08/10/21 1000  BP: 128/72  Pulse: 67  Resp: 18  Temp: 97.7 F (36.5 C)  Weight: 195 lb 9.6 oz (88.7 kg)  Height: 6' (1.829 m)   Body mass index is 26.53 kg/m.  Physical Exam  GENERAL APPEARANCE: Well nourished. In no acute distress. Normal body habitus SKIN:  Skin is warm and dry.  MOUTH and THROAT: Lips are without lesions. Oral mucosa is moist and without lesions.  RESPIRATORY: Breathing is even & unlabored, BS CTAB CARDIAC: Irregular heart rhythm, no murmur,no extra heart sounds, no edema GI: Abdomen soft, normal BS, no masses, no tenderness NEUROLOGICAL: There is no tremor. Speech is clear. Alert to self, disoriented to time and place. PSYCHIATRIC:  Affect and behavior are appropriate  Labs reviewed: Recent Labs    11/10/20 1508 11/10/20 2000 03/15/21 0348 03/16/21 0320 03/19/21 2115 03/20/21 0429 03/21/21 0501 03/22/21 0445 03/23/21 0500 03/25/21 0908 03/29/21 0000 04/05/21 0000  NA 167*   < > 140 143   < > 142   < > 140 139 137  146 139  K 4.1   < > 3.6 3.9   < > 4.7   < > 4.5 4.4 4.2 4.8 4.0  CL >130*   < > 106 106   < > 108   < > 102 104 103 107 102  CO2 20*   < > 26 28   < > 26   < > 28 27 24  26* 25*  GLUCOSE 152*   < > 105* 102*   < > 109*   < > 114* 127* 151*  --   --   BUN 85*   < > 23 22   < > 30*   < > 33* 33* 30* 24* 13  CREATININE 2.58*   < > 1.16 1.30*   < > 2.16*   < > 2.52* 2.47* 2.29* 1.4* 1.0  CALCIUM 8.5*   < > 8.5* 8.7*   < > 8.7*   < > 8.6* 8.5* 8.6* 8.9 9.0  MG  --    < > 1.9 1.9  --  2.1  --   --   --   --   --   --   PHOS 3.5  --   --   --   --  4.9*  --   --   --   --   --   --    < > = values in this interval not displayed.   Recent Labs    03/16/21 0320 03/19/21 2115 03/20/21 0429  AST 13* 11* 10*  ALT 9 10 8   ALKPHOS 41 44 44  BILITOT 0.5 0.4 0.7  PROT 5.0* 5.4* 5.6*  ALBUMIN 2.0* 2.0* 2.0*   Recent Labs    03/22/21 0445 03/23/21 0500 03/25/21 0908 03/29/21 0000 04/05/21 0000  WBC 13.4* 13.7* 11.0* 8.9 6.6  NEUTROABS 10.8* 11.0* 8.8*  --   --   HGB 9.6* 9.9* 10.7* 10.2* 10.7*  HCT 30.3* 31.4* 35.7* 31* 32*  MCV 84.4 84.2 88.1  --   --   PLT 440* 464* 481* 426* 359   Lab Results  Component Value Date   TSH 5.052 (H) 03/21/2021   Lab Results  Component Value Date   HGBA1C 5.3 03/11/2021   Lab Results  Component Value Date   CHOL 95 03/07/2021   HDL 24 (A) 03/07/2021   LDLCALC 44 03/07/2021   TRIG  137 03/07/2021   CHOLHDL 3.6 10/28/2018    Significant Diagnostic Results in last 30 days:  No results found.  Assessment/Plan  1. ACP (advance care planning) -   remains to be DNR -   discussed medications, vital signs and weights  2. COVID-19 virus infection -  continue Molnupiravir, Vitamin D3, Vitamin C and Zinc sulfate  3. Persistent atrial fibrillation (HCC) -  rate-controlled, continue Eliquis, Diltiazem and Amiodarone  4. Chronic hypotension -  BPs stable, continue Midodrine  5. Normochromic normocytic anemia Lab Results  Component Value Date    HGB 10.7 (A) 04/05/2021   -  stable, continue FeSO4  6. Vascular dementia without behavioral disturbance ([place]) -  BIMS score 7/15, ranging in severe cognitive impairment -  continue supportive care   Family/ staff Communication:   Discussed plan of care with  IDT.  Labs/tests ordered:  None  Goals of care:   Long-term care   Durenda Age, DNP, MSN, FNP-BC [facility] and Adult Medicine [phone] (Monday-Friday 8:00 a.m. - 5:00 p.m.) [phone] (after hours)

## 2021-08-15 DIAGNOSIS — I10 Essential (primary) hypertension: Secondary | ICD-10-CM | POA: Diagnosis not present

## 2021-08-15 LAB — BASIC METABOLIC PANEL
BUN: 29 — AB (ref 4–21)
CO2: 26 — AB (ref 13–22)
Chloride: 107 (ref 99–108)
Creatinine: 0.9 (ref 0.6–1.3)
Glucose: 147
Potassium: 4.5 (ref 3.4–5.3)
Sodium: 142 (ref 137–147)

## 2021-08-15 LAB — CBC AND DIFFERENTIAL
HCT: 41 (ref 41–53)
Hemoglobin: 13.6 (ref 13.5–17.5)
Platelets: 400 — AB (ref 150–399)
WBC: 13.1

## 2021-08-15 LAB — COMPREHENSIVE METABOLIC PANEL
Calcium: 9.3 (ref 8.7–10.7)
GFR calc Af Amer: >90
GFR calc non Af Amer: 82.39

## 2021-08-15 LAB — CBC: RBC: 4.76 (ref 3.87–5.11)

## 2021-08-18 DIAGNOSIS — Z23 Encounter for immunization: Secondary | ICD-10-CM | POA: Diagnosis not present

## 2021-08-29 ENCOUNTER — Non-Acute Institutional Stay (SKILLED_NURSING_FACILITY): Payer: Medicare Other | Admitting: Adult Health

## 2021-08-29 ENCOUNTER — Encounter: Payer: Self-pay | Admitting: Adult Health

## 2021-08-29 DIAGNOSIS — F0150 Vascular dementia without behavioral disturbance: Secondary | ICD-10-CM

## 2021-08-29 DIAGNOSIS — R569 Unspecified convulsions: Secondary | ICD-10-CM | POA: Diagnosis not present

## 2021-08-29 DIAGNOSIS — I9589 Other hypotension: Secondary | ICD-10-CM

## 2021-08-29 DIAGNOSIS — M1A09X0 Idiopathic chronic gout, multiple sites, without tophus (tophi): Secondary | ICD-10-CM | POA: Diagnosis not present

## 2021-08-29 DIAGNOSIS — I4819 Other persistent atrial fibrillation: Secondary | ICD-10-CM | POA: Diagnosis not present

## 2021-08-29 DIAGNOSIS — U071 COVID-19: Secondary | ICD-10-CM | POA: Diagnosis not present

## 2021-08-29 NOTE — Progress Notes (Signed)
Location:  [place] Room Number: 113-A Place of Service:  SNF (31) Provider:  Durenda Age, DNP, FNP-BC  Patient Care Team: Charlott Rakes, MD as PCP - General (Family Medicine) Constance Haw, MD as PCP - Electrophysiology (Cardiology)  Extended Emergency Contact Information Primary Emergency Contact: Cherylann Parr States of [place] Phone: [phone] Relation: Daughter Secondary Emergency Contact: SAPP,RON Home Phone: [phone] Relation: Other  Code Status: DNR  Goals of care: Advanced Directive information Advanced Directives 08/29/2021  Does Patient Have a Medical Advance Directive? Yes  Type of Advance Directive Out of facility DNR (pink MOST or yellow form)  Does patient want to make changes to medical advance directive? No - Patient declined  Would patient like information on creating a medical advance directive? -  Pre-existing out of facility DNR order (yellow form or pink MOST form) -     Chief Complaint  Patient presents with   Medical Management of Chronic Issues    Routine Visit    HPI:  Pt is a 70 y.o. male seen today for medical management of chronic diseases. He is a long-term care resident of [facility]. He takes Amiodarone 200 mg daily, Diltiazem 90 mg every 8 hours and Eliquis 5 mg BID for PAF. SBPs ranging from 105 to 142, with outlier 156. He takes Midodrine 10 mg TID for chronic hypotension. No recent gout flares. He takes Allopurinol 150 mg daily for gout. He had COVID-19 infection 3 weeks ago and was treated with Molnupiravir 800 mg BID X 5 days.     Past Medical History:  Diagnosis Date   Chronic kidney disease    Chronic systolic CHF (congestive heart failure) (HCC)    EF 35-40, diffuse HK, mild MR, moderate LAE, mild RAE, PASP 44, L pleural eff   Dilated cardiomyopathy ([place])    likely related to tachycardia   Dysrhythmia    Persistent atrial fibrillation [facility])     Past Surgical History:  Procedure Laterality Date   CARDIOVERSION N/A 08/20/2017   Procedure: CARDIOVERSION;  Surgeon: Jerline Pain, MD;  Location: [place];  Service: Cardiovascular;  Laterality: N/A;   INCISION AND DRAINAGE ABSCESS Left 03/22/2014   Procedure: INCISION AND DRAINAGE ABSCESS LEFT BUTTOCK ABSCESS;  Surgeon: Zenovia Jarred, MD;  Location: [place];  Service: General;  Laterality: Left;    No Known Allergies  Outpatient Encounter Medications as of 08/29/2021  Medication Sig   allopurinol (ZYLOPRIM) 300 MG tablet Take 150 mg by mouth daily.   Amino Acids-Protein Hydrolys (PRO-STAT PO) Take 30 mLs by mouth 2 (two) times daily.   apixaban (ELIQUIS) 5 MG TABS tablet Take 1 tablet (5 mg total) by mouth 2 (two) times daily.   atorvastatin (LIPITOR) 20 MG tablet Take 1 tablet (20 mg total) by mouth every evening.   bisacodyl (DULCOLAX) 10 MG suppository as needed. If not relieved by MOM, give 10 mg Bisacodyl suppositiory rectally X 1 dose in 24 hours as needed   Cholecalciferol (VITAMIN D) 50 MCG (2000 UT) tablet Take 2,000 Units by mouth daily.   colchicine 0.6 MG tablet Take 0.6 mg by mouth See admin instructions. 1.2 MG orally AT FIRST SIGN OF GOUT FLARE UP AND REPEAT 0.6 MG IN 1 HOUR IF SYMPTOMS PERSIST   diltiazem (CARDIZEM) 90 MG tablet Take 90 mg by mouth every 8 (eight) hours. Hold for SBP < 100 and Heart rate < 60   divalproex (DEPAKOTE SPRINKLE) 125 MG capsule Take 125  mg by mouth 2 (two) times daily.   feeding supplement (ENSURE ENLIVE / ENSURE PLUS) LIQD Take 237 mLs by mouth 2 (two) times daily between meals.   ferrous sulfate 325 (65 FE) MG tablet Take 325 mg by mouth 2 (two) times daily with a meal.   Magnesium Hydroxide (MILK OF MAGNESIA PO) If no BM in 3 days, give 30 cc Milk of Magnesium p.o. x 1 dose in 24 hours as needed (Do not use standing constipation orders for residents with renal failure CFR less than 30. Contact MD for orders)   Menthol, Topical  Analgesic, (BIOFREEZE) 4 % GEL Apply topically. APPLY TOPICALLY TO LEFT SHOULDER THREE TIMES DAILY FOR PAIN   midodrine (PROAMATINE) 10 MG tablet Take 1 tablet (10 mg total) by mouth 3 (three) times daily with meals.   NON FORMULARY MAGIC CUP: ONE MAGIC CUP DAILY FOR WOUND HEALING   NON FORMULARY Diet: Regular/thin diet consistency   pantoprazole (PROTONIX) 40 MG tablet Take 1 tablet (40 mg total) by mouth daily.   polyethylene glycol (MIRALAX / GLYCOLAX) 17 g packet Take 17 g by mouth daily.   Sodium Phosphates (RA SALINE ENEMA RE) If not relieved by MOM, give 10 mg Bisacodyl suppositiory rectally X 1 dose in 24 hours as needed   tamsulosin (FLOMAX) 0.4 MG CAPS capsule Take 1 capsule (0.4 mg total) by mouth daily after supper.   traMADol (ULTRAM) 50 MG tablet Take 0.5 tablets (25 mg total) by mouth 2 (two) times daily.   vitamin C (ASCORBIC ACID) 500 MG tablet Take 500 mg by mouth 2 (two) times daily.   zinc sulfate 220 (50 Zn) MG capsule Take 220 mg by mouth daily.   amiodarone (PACERONE) 400 MG tablet Take 400 mg by mouth daily. 400 mg TWICE A DAY FOR TWO WEEKS (05/18/2021-05/31/2021) 400 mg daily for 2 weeks (05/31/2021-06/14/2021 200 mg daily 09/14   molnupiravir EUA (LAGEVRIO) 200 MG CAPS capsule Take 4 capsules by mouth 2 (two) times daily.   No facility-administered encounter medications on file as of 08/29/2021.    Review of Systems  GENERAL: No change in appetite, no fatigue, no weight changes, no fever, chills  MOUTH and THROAT: Denies oral discomfort, gingival pain or bleeding RESPIRATORY: no cough, SOB, DOE, wheezing, hemoptysis CARDIAC: No chest pain, edema or palpitations GI: No abdominal pain, diarrhea, constipation, heart burn, nausea or vomiting GU: Denies dysuria, frequency, hematuria or discharge NEUROLOGICAL: Denies dizziness, syncope, numbness, or headache PSYCHIATRIC: Denies feelings of depression or anxiety. No report of hallucinations, insomnia, paranoia, or  agitation   Immunization History  Administered Date(s) Administered   DTaP 12/14/2020   PFIZER(Purple Top)SARS-COV-2 Vaccination 12/07/2020   Pneumococcal Conjugate-13 10/28/2018   Pneumococcal Polysaccharide-23 09/08/2020   Tdap 12/14/2020   Pertinent  Health Maintenance Due  Topic Date Due   COLONOSCOPY (Pts 45-74yrs Insurance coverage will need to be confirmed)  Never done   INFLUENZA VACCINE  Never done   Fall Risk 03/22/2021 03/23/2021 03/25/2021 03/25/2021 03/31/2021  Falls in the past year? - - - - 0  Was there an injury with Fall? - - - - -  Fall Risk Category Calculator - - - - -  Fall Risk Category - - - - -  Patient Fall Risk Level High fall risk High fall risk High fall risk High fall risk High fall risk  Patient at Risk for Falls Due to - - - - Impaired balance/gait;Impaired mobility  Fall risk Follow up - - - - Falls  evaluation completed     Vitals:   08/29/21 1420  BP: 96/62  Pulse: 76  Resp: 18  Temp: (!) 97.5 F (36.4 C)  Weight: 195 lb 9.6 oz (88.7 kg)  Height: 6' (1.829 m)   Body mass index is 26.53 kg/m.  Physical Exam  GENERAL APPEARANCE: Well nourished. In no acute distress. Normal body habitus SKIN:  Skin is warm and dry.  MOUTH and THROAT: Lips are without lesions. Oral mucosa is moist and without lesions.  RESPIRATORY: Breathing is even & unlabored, BS CTAB CARDIAC: Irregular heart rhythm, no murmur,no extra heart sounds, no edema GI: Abdomen soft, normal BS, no masses, no tenderness NEUROLOGICAL: There is no tremor. Speech is clear. Alert to self, disoriented to time and place. PSYCHIATRIC:  Affect and behavior are appropriate  Labs reviewed: Recent Labs    11/10/20 1508 11/10/20 2000 03/15/21 0348 03/16/21 0320 03/19/21 2115 03/20/21 0429 03/21/21 0501 03/22/21 0445 03/23/21 0500 03/25/21 0908 03/29/21 0000 04/05/21 0000 08/15/21 0000  NA 167*   < > 140 143   < > 142   < > 140 139 137 146 139 142  K 4.1   < > 3.6 3.9   < > 4.7    < > 4.5 4.4 4.2 4.8 4.0 4.5  CL >130*   < > 106 106   < > 108   < > 102 104 103 107 102 107  CO2 20*   < > 26 28   < > 26   < > 28 27 24  26* 25* 26*  GLUCOSE 152*   < > 105* 102*   < > 109*   < > 114* 127* 151*  --   --   --   BUN 85*   < > 23 22   < > 30*   < > 33* 33* 30* 24* 13 29*  CREATININE 2.58*   < > 1.16 1.30*   < > 2.16*   < > 2.52* 2.47* 2.29* 1.4* 1.0 0.9  CALCIUM 8.5*   < > 8.5* 8.7*   < > 8.7*   < > 8.6* 8.5* 8.6* 8.9 9.0 9.3  MG  --    < > 1.9 1.9  --  2.1  --   --   --   --   --   --   --   PHOS 3.5  --   --   --   --  4.9*  --   --   --   --   --   --   --    < > = values in this interval not displayed.   Recent Labs    03/16/21 0320 03/19/21 2115 03/20/21 0429  AST 13* 11* 10*  ALT 9 10 8   ALKPHOS 41 44 44  BILITOT 0.5 0.4 0.7  PROT 5.0* 5.4* 5.6*  ALBUMIN 2.0* 2.0* 2.0*   Recent Labs    03/22/21 0445 03/23/21 0500 03/25/21 0908 03/29/21 0000 04/05/21 0000 08/15/21 0000  WBC 13.4* 13.7* 11.0* 8.9 6.6 13.1  NEUTROABS 10.8* 11.0* 8.8*  --   --   --   HGB 9.6* 9.9* 10.7* 10.2* 10.7* 13.6  HCT 30.3* 31.4* 35.7* 31* 32* 41  MCV 84.4 84.2 88.1  --   --   --   PLT 440* 464* 481* 426* 359 400*   Lab Results  Component Value Date   TSH 5.052 (H) 03/21/2021   Lab Results  Component Value Date  HGBA1C 5.3 03/11/2021   Lab Results  Component Value Date   CHOL 95 03/07/2021   HDL 24 (A) 03/07/2021   LDLCALC 44 03/07/2021   TRIG 137 03/07/2021   CHOLHDL 3.6 10/28/2018    Significant Diagnostic Results in last 30 days:  No results found.  Assessment/Plan  1. Persistent atrial fibrillation (HCC) -  rate-controlled, continue Diltiazem and Amiodarone for rate-control and Eliquis for anticoagulation  2. Chronic hypotension -  BPs stable, continue Midodrine -   monitor BPs  3. Seizure-like activity (HCC) -   no recent seizure episode -  continue Depakote  4. Idiopathic chronic gout of multiple sites without tophus -  no recent gout flares,  continue Allopurinol  5. COVID-19 virus infection -  completed Molnupiravir X 5 days -  resolved  6. Vascular dementia without behavioral disturbance ([place]) -  BIMS score 7/15, ranging in severe cognitive impairment -   continue supportive care     Family/ staff Communication:   Discussed plan of care with resident and charge nurse.  Labs/tests ordered:  None  Goals of care:   Long-term care   Durenda Age, DNP, MSN, FNP-BC [facility] and Adult Medicine [phone] (Monday-Friday 8:00 a.m. - 5:00 p.m.) [phone] (after hours)

## 2021-08-30 DIAGNOSIS — I5022 Chronic systolic (congestive) heart failure: Secondary | ICD-10-CM | POA: Diagnosis not present

## 2021-08-30 DIAGNOSIS — M6281 Muscle weakness (generalized): Secondary | ICD-10-CM | POA: Diagnosis not present

## 2021-08-31 DIAGNOSIS — M6281 Muscle weakness (generalized): Secondary | ICD-10-CM | POA: Diagnosis not present

## 2021-08-31 DIAGNOSIS — I5022 Chronic systolic (congestive) heart failure: Secondary | ICD-10-CM | POA: Diagnosis not present

## 2021-08-31 DIAGNOSIS — M109 Gout, unspecified: Secondary | ICD-10-CM | POA: Diagnosis not present

## 2021-08-31 LAB — URIC ACID: Uric Acid: 5.2

## 2021-09-01 DIAGNOSIS — R2681 Unsteadiness on feet: Secondary | ICD-10-CM | POA: Diagnosis not present

## 2021-09-01 DIAGNOSIS — M6259 Muscle wasting and atrophy, not elsewhere classified, multiple sites: Secondary | ICD-10-CM | POA: Diagnosis not present

## 2021-09-01 DIAGNOSIS — L89310 Pressure ulcer of right buttock, unstageable: Secondary | ICD-10-CM | POA: Diagnosis not present

## 2021-09-01 DIAGNOSIS — Z741 Need for assistance with personal care: Secondary | ICD-10-CM | POA: Diagnosis not present

## 2021-09-01 DIAGNOSIS — N179 Acute kidney failure, unspecified: Secondary | ICD-10-CM | POA: Diagnosis not present

## 2021-09-01 DIAGNOSIS — R293 Abnormal posture: Secondary | ICD-10-CM | POA: Diagnosis not present

## 2021-09-01 DIAGNOSIS — R279 Unspecified lack of coordination: Secondary | ICD-10-CM | POA: Diagnosis not present

## 2021-09-01 DIAGNOSIS — I5022 Chronic systolic (congestive) heart failure: Secondary | ICD-10-CM | POA: Diagnosis not present

## 2021-09-01 DIAGNOSIS — M6281 Muscle weakness (generalized): Secondary | ICD-10-CM | POA: Diagnosis not present

## 2021-09-02 DIAGNOSIS — R293 Abnormal posture: Secondary | ICD-10-CM | POA: Diagnosis not present

## 2021-09-02 DIAGNOSIS — M6281 Muscle weakness (generalized): Secondary | ICD-10-CM | POA: Diagnosis not present

## 2021-09-02 DIAGNOSIS — M6259 Muscle wasting and atrophy, not elsewhere classified, multiple sites: Secondary | ICD-10-CM | POA: Diagnosis not present

## 2021-09-02 DIAGNOSIS — R2681 Unsteadiness on feet: Secondary | ICD-10-CM | POA: Diagnosis not present

## 2021-09-02 DIAGNOSIS — I5022 Chronic systolic (congestive) heart failure: Secondary | ICD-10-CM | POA: Diagnosis not present

## 2021-09-02 DIAGNOSIS — L89310 Pressure ulcer of right buttock, unstageable: Secondary | ICD-10-CM | POA: Diagnosis not present

## 2021-09-05 DIAGNOSIS — R2681 Unsteadiness on feet: Secondary | ICD-10-CM | POA: Diagnosis not present

## 2021-09-05 DIAGNOSIS — L89310 Pressure ulcer of right buttock, unstageable: Secondary | ICD-10-CM | POA: Diagnosis not present

## 2021-09-05 DIAGNOSIS — R293 Abnormal posture: Secondary | ICD-10-CM | POA: Diagnosis not present

## 2021-09-05 DIAGNOSIS — M6281 Muscle weakness (generalized): Secondary | ICD-10-CM | POA: Diagnosis not present

## 2021-09-05 DIAGNOSIS — M6259 Muscle wasting and atrophy, not elsewhere classified, multiple sites: Secondary | ICD-10-CM | POA: Diagnosis not present

## 2021-09-05 DIAGNOSIS — I5022 Chronic systolic (congestive) heart failure: Secondary | ICD-10-CM | POA: Diagnosis not present

## 2021-09-05 IMAGING — MR MR SACRUM / SI JOINTS WO CM
4 of 5 series · 19 of 48 positions shown · non-contrast
Comparison: CT pelvis dated March 22, 2014.

CLINICAL DATA: Sacral decubitus ulcer status post debridement.

EXAM:
MRI SACRUM WITHOUT CONTRAST
TECHNIQUE: Multiplanar, multisequence MR imaging of the sacrum was performed.
No intravenous contrast was administered.

[Series 3: T1 · axial · 4.0mm · 0.43mm/px · z∈[-223,-49]mm · 9 of 40 slices shown (1 of 2)]
[im 1/40]
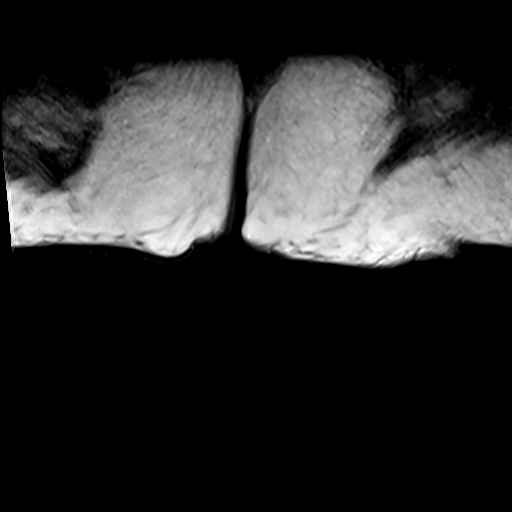
[im 8/40]
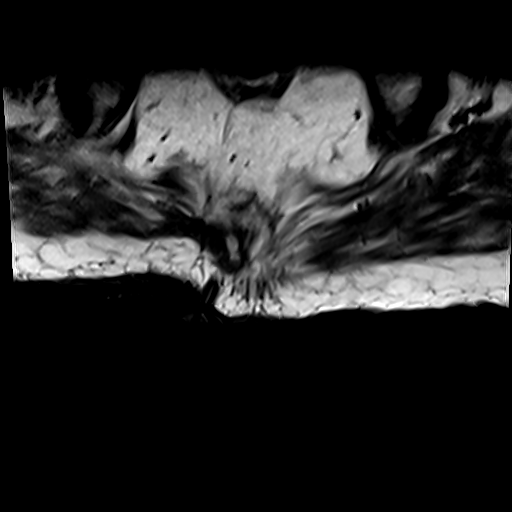
[im 11/40]
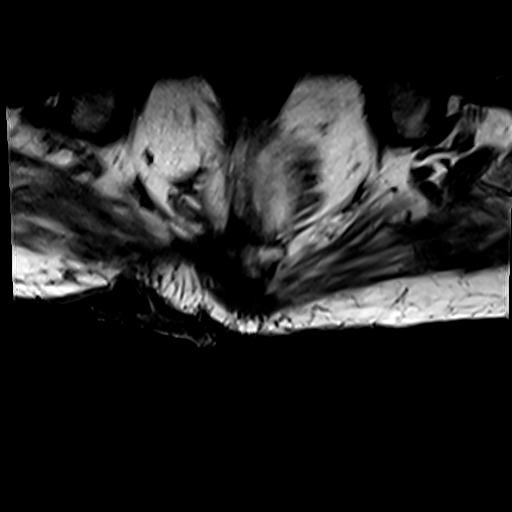
[im 18/40]
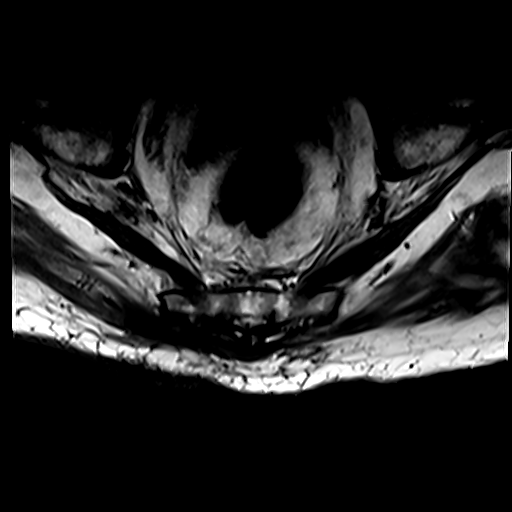
[im 22/40]
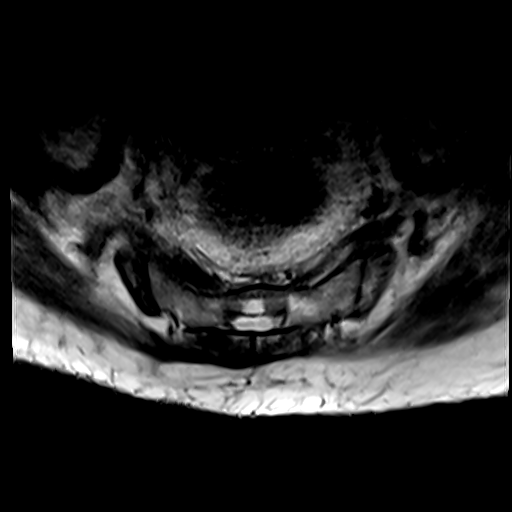
[im 29/40]
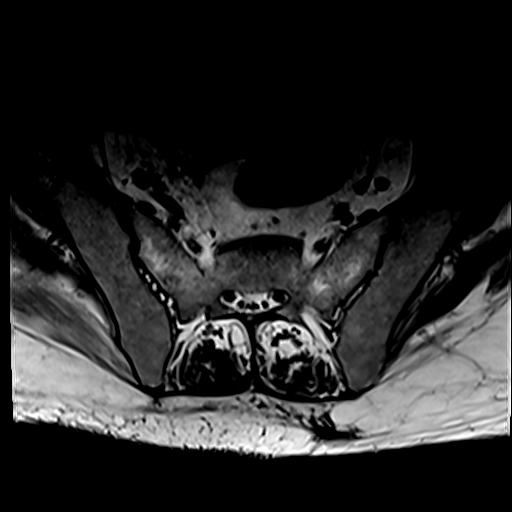
[im 32/40]
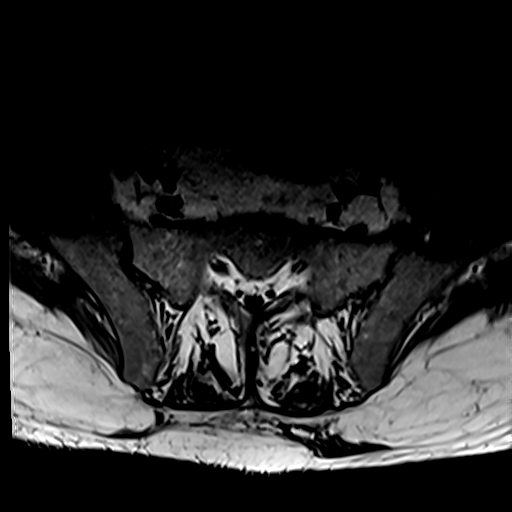
[im 36/40]
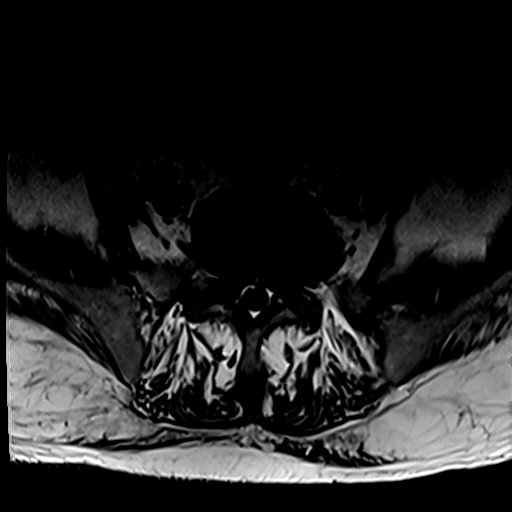
[im 40/40]
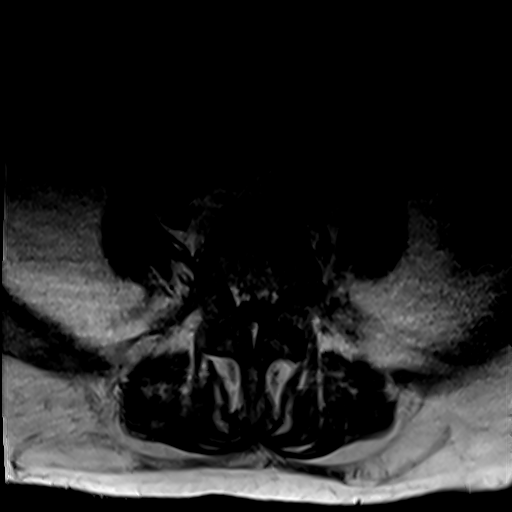

[Series 4: T2 fat-sat · axial · 4.0mm · 0.57mm/px · z∈[-210,-67]mm · 3 of 40 slices shown]
[im 4/40]
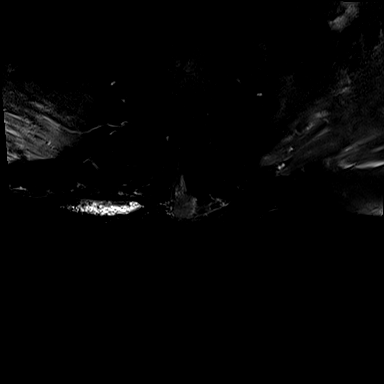
[im 20/40]
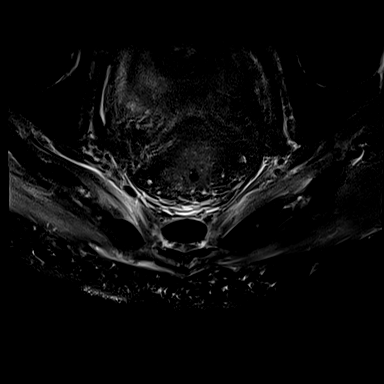
[im 36/40]
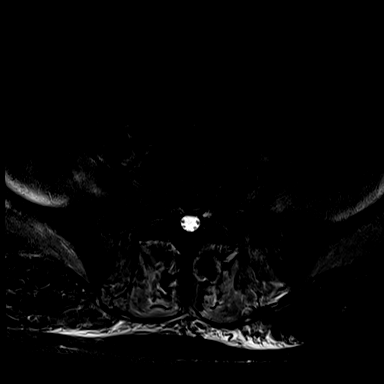

[Series 5: T1 · coronal · 3.0mm · 0.45mm/px · 4 of 28 slices shown (2 of 2)]
[im 1/28]
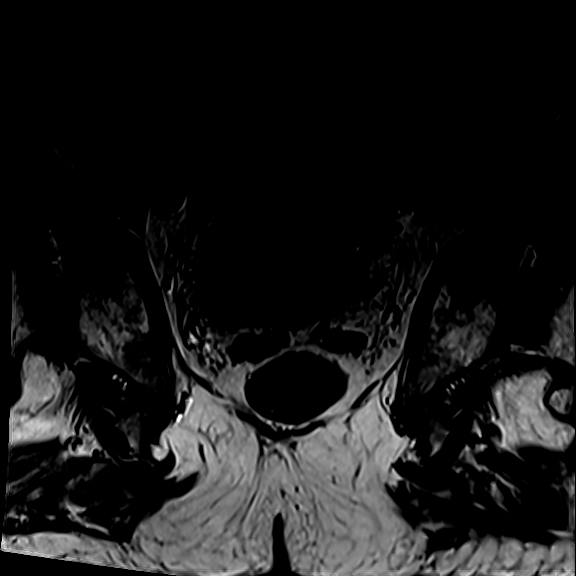
[im 4/28]
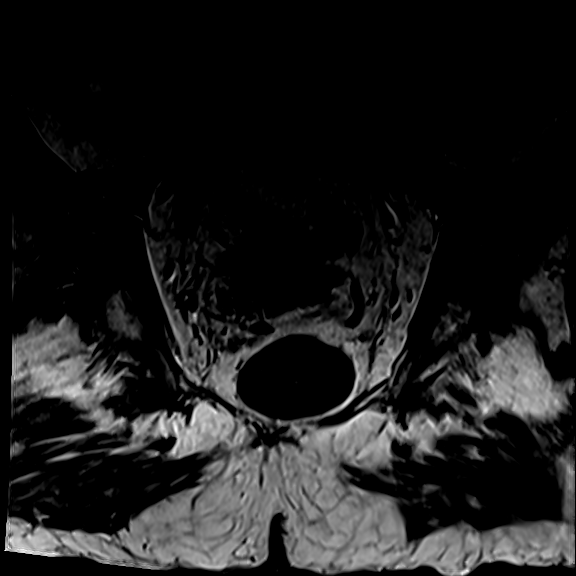
[im 16/28]
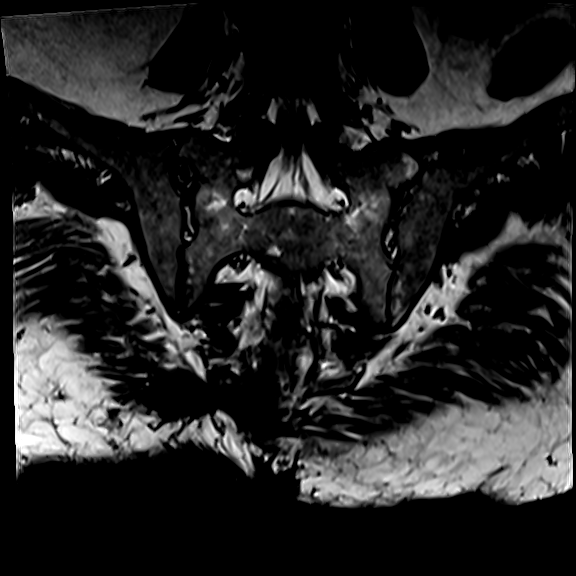
[im 24/28]
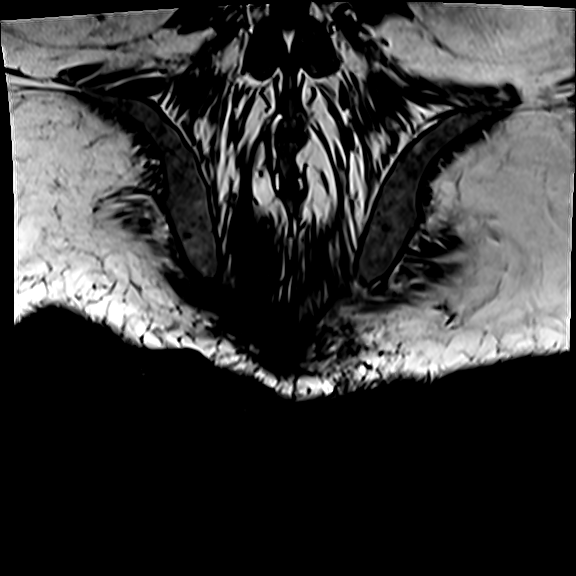

[Series 6: STIR · coronal · 3.0mm · 0.51mm/px · 3 of 28 slices shown]
[im 4/28]
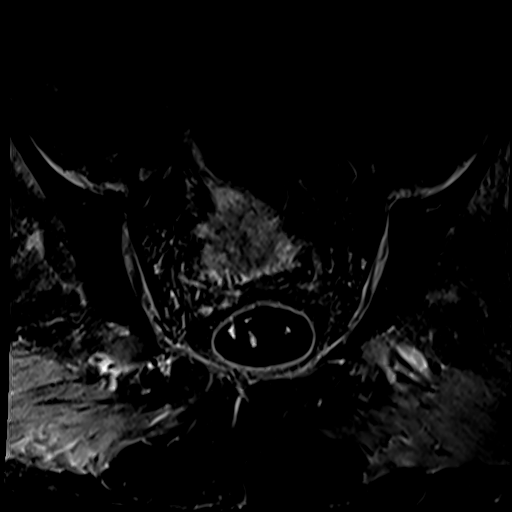
[im 16/28]
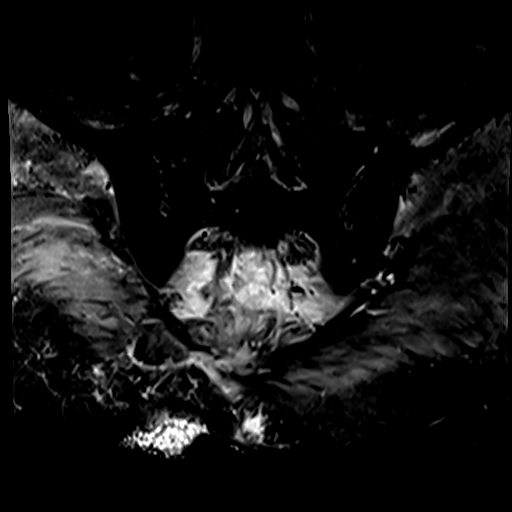
[im 24/28]
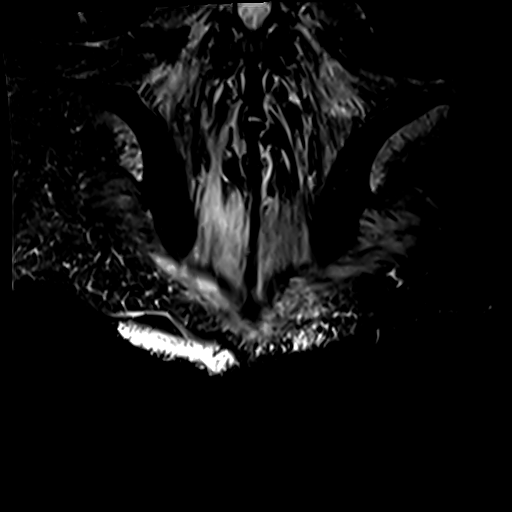

[19 of 48 positions shown; findings below may reference images not displayed]

FINDINGS: Bones/Joint/Cartilage

Abnormal marrow edema with corresponding decreased T1 marrow signal
involving the inferior sacral crest and sacrococcygeal junction
(series 7, image 17). No fracture or dislocation. Normal alignment.
No joint effusion.

Ligaments

Collateral ligaments are intact.

Muscles and Tendons
Right greater than left gluteal and piriformis muscle edema.
Symmetric bilateral lower paraspinous and iliacus muscle edema.

Soft tissue
Right-sided sacral decubitus ulcer with small amount of superior and
inferior undermining containing fluid. No fluid collection. No soft
tissue mass.

Mild presacral edema. Incompletely visualized bladder wall
thickening likely related to chronic outlet obstruction from
prostate median lobe hypertrophy indenting the bladder base.
IMPRESSION: 1. Right-sided sacral decubitus ulcer with underlying osteomyelitis
of the inferior sacral crest and sacrococcygeal junction. No
abscess.

## 2021-09-06 DIAGNOSIS — M6259 Muscle wasting and atrophy, not elsewhere classified, multiple sites: Secondary | ICD-10-CM | POA: Diagnosis not present

## 2021-09-06 DIAGNOSIS — L89310 Pressure ulcer of right buttock, unstageable: Secondary | ICD-10-CM | POA: Diagnosis not present

## 2021-09-06 DIAGNOSIS — R293 Abnormal posture: Secondary | ICD-10-CM | POA: Diagnosis not present

## 2021-09-06 DIAGNOSIS — R2681 Unsteadiness on feet: Secondary | ICD-10-CM | POA: Diagnosis not present

## 2021-09-06 DIAGNOSIS — I5022 Chronic systolic (congestive) heart failure: Secondary | ICD-10-CM | POA: Diagnosis not present

## 2021-09-06 DIAGNOSIS — M6281 Muscle weakness (generalized): Secondary | ICD-10-CM | POA: Diagnosis not present

## 2021-09-07 DIAGNOSIS — M6281 Muscle weakness (generalized): Secondary | ICD-10-CM | POA: Diagnosis not present

## 2021-09-07 DIAGNOSIS — L89310 Pressure ulcer of right buttock, unstageable: Secondary | ICD-10-CM | POA: Diagnosis not present

## 2021-09-07 DIAGNOSIS — R2681 Unsteadiness on feet: Secondary | ICD-10-CM | POA: Diagnosis not present

## 2021-09-07 DIAGNOSIS — I5022 Chronic systolic (congestive) heart failure: Secondary | ICD-10-CM | POA: Diagnosis not present

## 2021-09-07 DIAGNOSIS — R293 Abnormal posture: Secondary | ICD-10-CM | POA: Diagnosis not present

## 2021-09-07 DIAGNOSIS — M6259 Muscle wasting and atrophy, not elsewhere classified, multiple sites: Secondary | ICD-10-CM | POA: Diagnosis not present

## 2021-09-08 DIAGNOSIS — L89310 Pressure ulcer of right buttock, unstageable: Secondary | ICD-10-CM | POA: Diagnosis not present

## 2021-09-08 DIAGNOSIS — R293 Abnormal posture: Secondary | ICD-10-CM | POA: Diagnosis not present

## 2021-09-08 DIAGNOSIS — M6281 Muscle weakness (generalized): Secondary | ICD-10-CM | POA: Diagnosis not present

## 2021-09-08 DIAGNOSIS — R2681 Unsteadiness on feet: Secondary | ICD-10-CM | POA: Diagnosis not present

## 2021-09-08 DIAGNOSIS — M6259 Muscle wasting and atrophy, not elsewhere classified, multiple sites: Secondary | ICD-10-CM | POA: Diagnosis not present

## 2021-09-08 DIAGNOSIS — I5022 Chronic systolic (congestive) heart failure: Secondary | ICD-10-CM | POA: Diagnosis not present

## 2021-09-09 DIAGNOSIS — M6259 Muscle wasting and atrophy, not elsewhere classified, multiple sites: Secondary | ICD-10-CM | POA: Diagnosis not present

## 2021-09-09 DIAGNOSIS — M6281 Muscle weakness (generalized): Secondary | ICD-10-CM | POA: Diagnosis not present

## 2021-09-09 DIAGNOSIS — L89310 Pressure ulcer of right buttock, unstageable: Secondary | ICD-10-CM | POA: Diagnosis not present

## 2021-09-09 DIAGNOSIS — I5022 Chronic systolic (congestive) heart failure: Secondary | ICD-10-CM | POA: Diagnosis not present

## 2021-09-09 DIAGNOSIS — R2681 Unsteadiness on feet: Secondary | ICD-10-CM | POA: Diagnosis not present

## 2021-09-09 DIAGNOSIS — R293 Abnormal posture: Secondary | ICD-10-CM | POA: Diagnosis not present

## 2021-09-12 DIAGNOSIS — M6281 Muscle weakness (generalized): Secondary | ICD-10-CM | POA: Diagnosis not present

## 2021-09-12 DIAGNOSIS — R2681 Unsteadiness on feet: Secondary | ICD-10-CM | POA: Diagnosis not present

## 2021-09-12 DIAGNOSIS — R293 Abnormal posture: Secondary | ICD-10-CM | POA: Diagnosis not present

## 2021-09-12 DIAGNOSIS — L89310 Pressure ulcer of right buttock, unstageable: Secondary | ICD-10-CM | POA: Diagnosis not present

## 2021-09-12 DIAGNOSIS — I5022 Chronic systolic (congestive) heart failure: Secondary | ICD-10-CM | POA: Diagnosis not present

## 2021-09-12 DIAGNOSIS — M6259 Muscle wasting and atrophy, not elsewhere classified, multiple sites: Secondary | ICD-10-CM | POA: Diagnosis not present

## 2021-09-13 ENCOUNTER — Non-Acute Institutional Stay (SKILLED_NURSING_FACILITY): Payer: Medicare Other | Admitting: Internal Medicine

## 2021-09-13 ENCOUNTER — Other Ambulatory Visit: Payer: Self-pay | Admitting: Adult Health

## 2021-09-13 ENCOUNTER — Encounter: Payer: Self-pay | Admitting: Internal Medicine

## 2021-09-13 DIAGNOSIS — M6281 Muscle weakness (generalized): Secondary | ICD-10-CM | POA: Diagnosis not present

## 2021-09-13 DIAGNOSIS — I4891 Unspecified atrial fibrillation: Secondary | ICD-10-CM

## 2021-09-13 DIAGNOSIS — L89310 Pressure ulcer of right buttock, unstageable: Secondary | ICD-10-CM | POA: Diagnosis not present

## 2021-09-13 DIAGNOSIS — D649 Anemia, unspecified: Secondary | ICD-10-CM

## 2021-09-13 DIAGNOSIS — R293 Abnormal posture: Secondary | ICD-10-CM | POA: Diagnosis not present

## 2021-09-13 DIAGNOSIS — N183 Chronic kidney disease, stage 3 unspecified: Secondary | ICD-10-CM

## 2021-09-13 DIAGNOSIS — R2681 Unsteadiness on feet: Secondary | ICD-10-CM | POA: Diagnosis not present

## 2021-09-13 DIAGNOSIS — M6259 Muscle wasting and atrophy, not elsewhere classified, multiple sites: Secondary | ICD-10-CM | POA: Diagnosis not present

## 2021-09-13 DIAGNOSIS — I5022 Chronic systolic (congestive) heart failure: Secondary | ICD-10-CM | POA: Diagnosis not present

## 2021-09-13 MED ORDER — TRAMADOL HCL 50 MG PO TABS: 25.0000 mg | ORAL_TABLET | Freq: Two times a day (BID) | ORAL | 0 refills | Status: DC

## 2021-09-13 NOTE — Assessment & Plan Note (Signed)
Trace edema is present at the sock line.  He has no hepatojugular reflux or neck vein distention.  Clinically congestive heart failure is well compensated.

## 2021-09-13 NOTE — Assessment & Plan Note (Addendum)
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  No indication for change in medications.

## 2021-09-13 NOTE — Patient Instructions (Signed)
See assessment and plan under each diagnosis in the problem list and acutely for this visit 

## 2021-09-13 NOTE — Assessment & Plan Note (Signed)
Clinically he is in A. fib with a well-controlled ventricular rate.  No change indicated.

## 2021-09-13 NOTE — Assessment & Plan Note (Signed)
Most recent CBC on record revealed resolution of the anemia.  No bleeding dyscrasias reported by staff.

## 2021-09-13 NOTE — Progress Notes (Signed)
NURSING HOME LOCATION:  [facility] ROOM NUMBER:  106 [id number]  CODE STATUS:  DNR  PCP:  Pascal Lux MD  This is a nursing facility follow up visit of chronic medical diagnoses & to document compliance with Regulation 483.30 (c) in [employer] [place] Manual Phase 2 which mandates caregiver visit ( visits can alternate among physician, PA or NP as per statutes) within 10 days of 30 days / 60 days/ 90 days post admission to SNF date    Interim medical record and care since last SNF visit was updated with review of diagnostic studies and change in clinical status since last visit were documented.  HPI: He is a permanent resident of this facility with medical diagnoses of chronic systolic congestive heart failure in the context of history of dilated cardiomyopathy, persistent A. fib, CKD, Procedures include cardioversion in 2018.  On March 19, 2021 mild prerenal azotemia was present with a BUN of 29.  Moderate-severe protein caloric malnutrition was present with albumin of 2.0 and total protein of 5.6.  Renal function is surprisingly good with a creatinine of 0.9 and GFR of 82.39.  LDL is at goal with a value of 44.  CBC was normal; platelet count was minimally elevated at 400,000. CBC update on 11/14 revealed H/H of 13.6/40.5 with normochromic, normocytic indices.  Mild prerenal azotemia persisted with a BUN of 28.6.  Creatinine was 0.94 and GFR 82.39 , confirming high stage II CKD.  Review of systems: Dementia invalidated responses. Date given as "22, 2001".  He does state that he has double vision occasionally.  When asked how long this been a problem he said "for some time".  He could not elaborate.  He also stated that he had some difficulty sleeping but could not define a pattern.  When I asked about his appetite he stated " like a horse".  Staff says that there are no issues except he intermittently will use profane language.  Constitutional: No fever, significant  weight change, fatigue  Eyes: No redness, discharge, pain ENT/mouth: No nasal congestion,  purulent discharge, earache, change in hearing, sore throat  Cardiovascular: No chest pain, palpitations, paroxysmal nocturnal dyspnea, claudication, edema  Respiratory: No cough, sputum production, hemoptysis, DOE, significant snoring, apnea   Gastrointestinal: No heartburn, dysphagia, abdominal pain, nausea /vomiting, rectal bleeding, melena, change in bowels Genitourinary: No dysuria, hematuria, pyuria, incontinence, nocturia Musculoskeletal: No joint stiffness, joint swelling, weakness, pain Dermatologic: No rash, pruritus, change in appearance of skin Neurologic: No dizziness, headache, syncope, seizures, numbness, tingling Psychiatric: No significant anxiety, depression, anorexia Endocrine: No change in hair/skin/nails, excessive thirst, excessive hunger, excessive urination  Hematologic/lymphatic: No significant bruising, lymphadenopathy, abnormal bleeding Allergy/immunology: No itchy/watery eyes, significant sneezing, urticaria, angioedema  Physical exam:  Pertinent or positive findings: Hair is disheveled ;he has pattern alopecia.  Facies tend to be blank. Lower lids are puffy.  The left nasolabial fold is decreased.  Heart sounds are distant and the rhythm is slow and irregular.  Breath sounds are decreased.  Dorsalis pedis pulses are stronger than posterior tibial pulses.  He has trace edema at the sock line.  Opposition to strength is good,especially in the upper extremities.  General appearance: Adequately nourished; no acute distress, increased work of breathing is present.   Lymphatic: No lymphadenopathy about the head, neck, axilla. Eyes: No conjunctival inflammation or lid edema is present. There is no scleral icterus. Ears:  External ear exam shows no significant lesions or deformities.  Nose:  External nasal examination shows no deformity or inflammation. Nasal mucosa are pink and  moist without lesions, exudates Oral exam:  Lips and gums are healthy appearing. There is no oropharyngeal erythema or exudate. Neck:  No thyromegaly, masses, tenderness noted.    Heart:  No gallop, murmur, click, rub .  Lungs:  without wheezes, rhonchi, rales, rubs. Abdomen: Bowel sounds are normal. Abdomen is soft and nontender with no organomegaly, hernias, masses. GU: Deferred  Extremities:  No cyanosis, clubbing Neurologic exam :Balance, Rhomberg, finger to nose testing could not be completed due to clinical state Skin: Warm & dry w/o tenting. No significant lesions or rash.  See summary under each active problem in the Problem List with associated updated therapeutic plan

## 2021-09-14 DIAGNOSIS — R293 Abnormal posture: Secondary | ICD-10-CM | POA: Diagnosis not present

## 2021-09-14 DIAGNOSIS — R2681 Unsteadiness on feet: Secondary | ICD-10-CM | POA: Diagnosis not present

## 2021-09-14 DIAGNOSIS — M6281 Muscle weakness (generalized): Secondary | ICD-10-CM | POA: Diagnosis not present

## 2021-09-14 DIAGNOSIS — M6259 Muscle wasting and atrophy, not elsewhere classified, multiple sites: Secondary | ICD-10-CM | POA: Diagnosis not present

## 2021-09-14 DIAGNOSIS — I5022 Chronic systolic (congestive) heart failure: Secondary | ICD-10-CM | POA: Diagnosis not present

## 2021-09-14 DIAGNOSIS — L89310 Pressure ulcer of right buttock, unstageable: Secondary | ICD-10-CM | POA: Diagnosis not present

## 2021-09-15 DIAGNOSIS — R2681 Unsteadiness on feet: Secondary | ICD-10-CM | POA: Diagnosis not present

## 2021-09-15 DIAGNOSIS — M6259 Muscle wasting and atrophy, not elsewhere classified, multiple sites: Secondary | ICD-10-CM | POA: Diagnosis not present

## 2021-09-15 DIAGNOSIS — R293 Abnormal posture: Secondary | ICD-10-CM | POA: Diagnosis not present

## 2021-09-15 DIAGNOSIS — I5022 Chronic systolic (congestive) heart failure: Secondary | ICD-10-CM | POA: Diagnosis not present

## 2021-09-15 DIAGNOSIS — M6281 Muscle weakness (generalized): Secondary | ICD-10-CM | POA: Diagnosis not present

## 2021-09-15 DIAGNOSIS — L89310 Pressure ulcer of right buttock, unstageable: Secondary | ICD-10-CM | POA: Diagnosis not present

## 2021-09-16 DIAGNOSIS — R293 Abnormal posture: Secondary | ICD-10-CM | POA: Diagnosis not present

## 2021-09-16 DIAGNOSIS — I5022 Chronic systolic (congestive) heart failure: Secondary | ICD-10-CM | POA: Diagnosis not present

## 2021-09-16 DIAGNOSIS — M6259 Muscle wasting and atrophy, not elsewhere classified, multiple sites: Secondary | ICD-10-CM | POA: Diagnosis not present

## 2021-09-16 DIAGNOSIS — R2681 Unsteadiness on feet: Secondary | ICD-10-CM | POA: Diagnosis not present

## 2021-09-16 DIAGNOSIS — L89310 Pressure ulcer of right buttock, unstageable: Secondary | ICD-10-CM | POA: Diagnosis not present

## 2021-09-16 DIAGNOSIS — M6281 Muscle weakness (generalized): Secondary | ICD-10-CM | POA: Diagnosis not present

## 2021-09-17 DIAGNOSIS — M6281 Muscle weakness (generalized): Secondary | ICD-10-CM | POA: Diagnosis not present

## 2021-09-17 DIAGNOSIS — L89310 Pressure ulcer of right buttock, unstageable: Secondary | ICD-10-CM | POA: Diagnosis not present

## 2021-09-17 DIAGNOSIS — R293 Abnormal posture: Secondary | ICD-10-CM | POA: Diagnosis not present

## 2021-09-17 DIAGNOSIS — I5022 Chronic systolic (congestive) heart failure: Secondary | ICD-10-CM | POA: Diagnosis not present

## 2021-09-17 DIAGNOSIS — R2681 Unsteadiness on feet: Secondary | ICD-10-CM | POA: Diagnosis not present

## 2021-09-17 DIAGNOSIS — M6259 Muscle wasting and atrophy, not elsewhere classified, multiple sites: Secondary | ICD-10-CM | POA: Diagnosis not present

## 2021-09-19 DIAGNOSIS — R2681 Unsteadiness on feet: Secondary | ICD-10-CM | POA: Diagnosis not present

## 2021-09-19 DIAGNOSIS — R293 Abnormal posture: Secondary | ICD-10-CM | POA: Diagnosis not present

## 2021-09-19 DIAGNOSIS — L89310 Pressure ulcer of right buttock, unstageable: Secondary | ICD-10-CM | POA: Diagnosis not present

## 2021-09-19 DIAGNOSIS — M6259 Muscle wasting and atrophy, not elsewhere classified, multiple sites: Secondary | ICD-10-CM | POA: Diagnosis not present

## 2021-09-19 DIAGNOSIS — I5022 Chronic systolic (congestive) heart failure: Secondary | ICD-10-CM | POA: Diagnosis not present

## 2021-09-19 DIAGNOSIS — M6281 Muscle weakness (generalized): Secondary | ICD-10-CM | POA: Diagnosis not present

## 2021-09-20 DIAGNOSIS — I5022 Chronic systolic (congestive) heart failure: Secondary | ICD-10-CM | POA: Diagnosis not present

## 2021-09-20 DIAGNOSIS — M6259 Muscle wasting and atrophy, not elsewhere classified, multiple sites: Secondary | ICD-10-CM | POA: Diagnosis not present

## 2021-09-20 DIAGNOSIS — R2681 Unsteadiness on feet: Secondary | ICD-10-CM | POA: Diagnosis not present

## 2021-09-20 DIAGNOSIS — R293 Abnormal posture: Secondary | ICD-10-CM | POA: Diagnosis not present

## 2021-09-20 DIAGNOSIS — M6281 Muscle weakness (generalized): Secondary | ICD-10-CM | POA: Diagnosis not present

## 2021-09-20 DIAGNOSIS — L89310 Pressure ulcer of right buttock, unstageable: Secondary | ICD-10-CM | POA: Diagnosis not present

## 2021-09-21 DIAGNOSIS — I5022 Chronic systolic (congestive) heart failure: Secondary | ICD-10-CM | POA: Diagnosis not present

## 2021-09-21 DIAGNOSIS — M6281 Muscle weakness (generalized): Secondary | ICD-10-CM | POA: Diagnosis not present

## 2021-09-21 DIAGNOSIS — M6259 Muscle wasting and atrophy, not elsewhere classified, multiple sites: Secondary | ICD-10-CM | POA: Diagnosis not present

## 2021-09-21 DIAGNOSIS — L89310 Pressure ulcer of right buttock, unstageable: Secondary | ICD-10-CM | POA: Diagnosis not present

## 2021-09-21 DIAGNOSIS — R2681 Unsteadiness on feet: Secondary | ICD-10-CM | POA: Diagnosis not present

## 2021-09-21 DIAGNOSIS — R293 Abnormal posture: Secondary | ICD-10-CM | POA: Diagnosis not present

## 2021-09-22 DIAGNOSIS — R2681 Unsteadiness on feet: Secondary | ICD-10-CM | POA: Diagnosis not present

## 2021-09-22 DIAGNOSIS — R293 Abnormal posture: Secondary | ICD-10-CM | POA: Diagnosis not present

## 2021-09-22 DIAGNOSIS — M6281 Muscle weakness (generalized): Secondary | ICD-10-CM | POA: Diagnosis not present

## 2021-09-22 DIAGNOSIS — I5022 Chronic systolic (congestive) heart failure: Secondary | ICD-10-CM | POA: Diagnosis not present

## 2021-09-22 DIAGNOSIS — M6259 Muscle wasting and atrophy, not elsewhere classified, multiple sites: Secondary | ICD-10-CM | POA: Diagnosis not present

## 2021-09-22 DIAGNOSIS — L89310 Pressure ulcer of right buttock, unstageable: Secondary | ICD-10-CM | POA: Diagnosis not present

## 2021-09-23 DIAGNOSIS — R2681 Unsteadiness on feet: Secondary | ICD-10-CM | POA: Diagnosis not present

## 2021-09-23 DIAGNOSIS — L89310 Pressure ulcer of right buttock, unstageable: Secondary | ICD-10-CM | POA: Diagnosis not present

## 2021-09-23 DIAGNOSIS — M6281 Muscle weakness (generalized): Secondary | ICD-10-CM | POA: Diagnosis not present

## 2021-09-23 DIAGNOSIS — I5022 Chronic systolic (congestive) heart failure: Secondary | ICD-10-CM | POA: Diagnosis not present

## 2021-09-23 DIAGNOSIS — R293 Abnormal posture: Secondary | ICD-10-CM | POA: Diagnosis not present

## 2021-09-23 DIAGNOSIS — M6259 Muscle wasting and atrophy, not elsewhere classified, multiple sites: Secondary | ICD-10-CM | POA: Diagnosis not present

## 2021-09-26 DIAGNOSIS — R2681 Unsteadiness on feet: Secondary | ICD-10-CM | POA: Diagnosis not present

## 2021-09-26 DIAGNOSIS — M6259 Muscle wasting and atrophy, not elsewhere classified, multiple sites: Secondary | ICD-10-CM | POA: Diagnosis not present

## 2021-09-26 DIAGNOSIS — L89310 Pressure ulcer of right buttock, unstageable: Secondary | ICD-10-CM | POA: Diagnosis not present

## 2021-09-26 DIAGNOSIS — R293 Abnormal posture: Secondary | ICD-10-CM | POA: Diagnosis not present

## 2021-09-26 DIAGNOSIS — I5022 Chronic systolic (congestive) heart failure: Secondary | ICD-10-CM | POA: Diagnosis not present

## 2021-09-26 DIAGNOSIS — M6281 Muscle weakness (generalized): Secondary | ICD-10-CM | POA: Diagnosis not present

## 2021-09-27 DIAGNOSIS — R2681 Unsteadiness on feet: Secondary | ICD-10-CM | POA: Diagnosis not present

## 2021-09-27 DIAGNOSIS — M6259 Muscle wasting and atrophy, not elsewhere classified, multiple sites: Secondary | ICD-10-CM | POA: Diagnosis not present

## 2021-09-27 DIAGNOSIS — M6281 Muscle weakness (generalized): Secondary | ICD-10-CM | POA: Diagnosis not present

## 2021-09-27 DIAGNOSIS — R293 Abnormal posture: Secondary | ICD-10-CM | POA: Diagnosis not present

## 2021-09-27 DIAGNOSIS — I5022 Chronic systolic (congestive) heart failure: Secondary | ICD-10-CM | POA: Diagnosis not present

## 2021-09-27 DIAGNOSIS — L89310 Pressure ulcer of right buttock, unstageable: Secondary | ICD-10-CM | POA: Diagnosis not present

## 2021-09-28 DIAGNOSIS — R2681 Unsteadiness on feet: Secondary | ICD-10-CM | POA: Diagnosis not present

## 2021-09-28 DIAGNOSIS — M6259 Muscle wasting and atrophy, not elsewhere classified, multiple sites: Secondary | ICD-10-CM | POA: Diagnosis not present

## 2021-09-28 DIAGNOSIS — L89310 Pressure ulcer of right buttock, unstageable: Secondary | ICD-10-CM | POA: Diagnosis not present

## 2021-09-28 DIAGNOSIS — I5022 Chronic systolic (congestive) heart failure: Secondary | ICD-10-CM | POA: Diagnosis not present

## 2021-09-28 DIAGNOSIS — M6281 Muscle weakness (generalized): Secondary | ICD-10-CM | POA: Diagnosis not present

## 2021-09-28 DIAGNOSIS — R293 Abnormal posture: Secondary | ICD-10-CM | POA: Diagnosis not present

## 2021-09-29 DIAGNOSIS — R293 Abnormal posture: Secondary | ICD-10-CM | POA: Diagnosis not present

## 2021-09-29 DIAGNOSIS — L89310 Pressure ulcer of right buttock, unstageable: Secondary | ICD-10-CM | POA: Diagnosis not present

## 2021-09-29 DIAGNOSIS — R2681 Unsteadiness on feet: Secondary | ICD-10-CM | POA: Diagnosis not present

## 2021-09-29 DIAGNOSIS — M6281 Muscle weakness (generalized): Secondary | ICD-10-CM | POA: Diagnosis not present

## 2021-09-29 DIAGNOSIS — M6259 Muscle wasting and atrophy, not elsewhere classified, multiple sites: Secondary | ICD-10-CM | POA: Diagnosis not present

## 2021-09-29 DIAGNOSIS — I5022 Chronic systolic (congestive) heart failure: Secondary | ICD-10-CM | POA: Diagnosis not present

## 2021-09-30 DIAGNOSIS — R293 Abnormal posture: Secondary | ICD-10-CM | POA: Diagnosis not present

## 2021-09-30 DIAGNOSIS — I5022 Chronic systolic (congestive) heart failure: Secondary | ICD-10-CM | POA: Diagnosis not present

## 2021-09-30 DIAGNOSIS — R2681 Unsteadiness on feet: Secondary | ICD-10-CM | POA: Diagnosis not present

## 2021-09-30 DIAGNOSIS — M6281 Muscle weakness (generalized): Secondary | ICD-10-CM | POA: Diagnosis not present

## 2021-09-30 DIAGNOSIS — L89310 Pressure ulcer of right buttock, unstageable: Secondary | ICD-10-CM | POA: Diagnosis not present

## 2021-09-30 DIAGNOSIS — M6259 Muscle wasting and atrophy, not elsewhere classified, multiple sites: Secondary | ICD-10-CM | POA: Diagnosis not present

## 2021-10-01 DIAGNOSIS — R2681 Unsteadiness on feet: Secondary | ICD-10-CM | POA: Diagnosis not present

## 2021-10-01 DIAGNOSIS — L89310 Pressure ulcer of right buttock, unstageable: Secondary | ICD-10-CM | POA: Diagnosis not present

## 2021-10-01 DIAGNOSIS — M6259 Muscle wasting and atrophy, not elsewhere classified, multiple sites: Secondary | ICD-10-CM | POA: Diagnosis not present

## 2021-10-01 DIAGNOSIS — R293 Abnormal posture: Secondary | ICD-10-CM | POA: Diagnosis not present

## 2021-10-01 DIAGNOSIS — I5022 Chronic systolic (congestive) heart failure: Secondary | ICD-10-CM | POA: Diagnosis not present

## 2021-10-01 DIAGNOSIS — M6281 Muscle weakness (generalized): Secondary | ICD-10-CM | POA: Diagnosis not present

## 2021-10-02 DIAGNOSIS — R293 Abnormal posture: Secondary | ICD-10-CM | POA: Diagnosis not present

## 2021-10-02 DIAGNOSIS — I5022 Chronic systolic (congestive) heart failure: Secondary | ICD-10-CM | POA: Diagnosis not present

## 2021-10-02 DIAGNOSIS — M6259 Muscle wasting and atrophy, not elsewhere classified, multiple sites: Secondary | ICD-10-CM | POA: Diagnosis not present

## 2021-10-02 DIAGNOSIS — N179 Acute kidney failure, unspecified: Secondary | ICD-10-CM | POA: Diagnosis not present

## 2021-10-02 DIAGNOSIS — Z741 Need for assistance with personal care: Secondary | ICD-10-CM | POA: Diagnosis not present

## 2021-10-02 DIAGNOSIS — M6281 Muscle weakness (generalized): Secondary | ICD-10-CM | POA: Diagnosis not present

## 2021-10-02 DIAGNOSIS — R279 Unspecified lack of coordination: Secondary | ICD-10-CM | POA: Diagnosis not present

## 2021-10-02 DIAGNOSIS — R2681 Unsteadiness on feet: Secondary | ICD-10-CM | POA: Diagnosis not present

## 2021-10-02 DIAGNOSIS — L89310 Pressure ulcer of right buttock, unstageable: Secondary | ICD-10-CM | POA: Diagnosis not present

## 2021-10-03 DIAGNOSIS — R293 Abnormal posture: Secondary | ICD-10-CM | POA: Diagnosis not present

## 2021-10-03 DIAGNOSIS — L89310 Pressure ulcer of right buttock, unstageable: Secondary | ICD-10-CM | POA: Diagnosis not present

## 2021-10-03 DIAGNOSIS — M6259 Muscle wasting and atrophy, not elsewhere classified, multiple sites: Secondary | ICD-10-CM | POA: Diagnosis not present

## 2021-10-03 DIAGNOSIS — M6281 Muscle weakness (generalized): Secondary | ICD-10-CM | POA: Diagnosis not present

## 2021-10-03 DIAGNOSIS — R2681 Unsteadiness on feet: Secondary | ICD-10-CM | POA: Diagnosis not present

## 2021-10-03 DIAGNOSIS — I5022 Chronic systolic (congestive) heart failure: Secondary | ICD-10-CM | POA: Diagnosis not present

## 2021-10-04 ENCOUNTER — Non-Acute Institutional Stay (SKILLED_NURSING_FACILITY): Payer: Medicare Other | Admitting: Adult Health

## 2021-10-04 ENCOUNTER — Encounter: Payer: Self-pay | Admitting: Adult Health

## 2021-10-04 DIAGNOSIS — M6281 Muscle weakness (generalized): Secondary | ICD-10-CM | POA: Diagnosis not present

## 2021-10-04 DIAGNOSIS — I5022 Chronic systolic (congestive) heart failure: Secondary | ICD-10-CM | POA: Diagnosis not present

## 2021-10-04 DIAGNOSIS — D649 Anemia, unspecified: Secondary | ICD-10-CM | POA: Diagnosis not present

## 2021-10-04 DIAGNOSIS — R293 Abnormal posture: Secondary | ICD-10-CM | POA: Diagnosis not present

## 2021-10-04 DIAGNOSIS — I4819 Other persistent atrial fibrillation: Secondary | ICD-10-CM

## 2021-10-04 DIAGNOSIS — R569 Unspecified convulsions: Secondary | ICD-10-CM | POA: Diagnosis not present

## 2021-10-04 DIAGNOSIS — M1A09X0 Idiopathic chronic gout, multiple sites, without tophus (tophi): Secondary | ICD-10-CM

## 2021-10-04 DIAGNOSIS — M6259 Muscle wasting and atrophy, not elsewhere classified, multiple sites: Secondary | ICD-10-CM | POA: Diagnosis not present

## 2021-10-04 DIAGNOSIS — L89310 Pressure ulcer of right buttock, unstageable: Secondary | ICD-10-CM | POA: Diagnosis not present

## 2021-10-04 DIAGNOSIS — R2681 Unsteadiness on feet: Secondary | ICD-10-CM | POA: Diagnosis not present

## 2021-10-04 NOTE — Progress Notes (Signed)
Location:  [facility] Room Number: 106-A Place of Service:  SNF (31) Provider:  Durenda Age, DNP, FNP-BC  Patient Care Team: Charlott Rakes, MD as PCP - General (Family Medicine) Constance Haw, MD as PCP - Electrophysiology (Cardiology)  Extended Emergency Contact Information Primary Emergency Contact: Cherylann Parr [place] of [place] Phone: [phone] Relation: Daughter Secondary Emergency Contact: SAPP,RON Home Phone: [phone] Relation: Other  Code Status:  DNR  Goals of care: Advanced Directive information Advanced Directives 10/04/2021  Does Patient Have a Medical Advance Directive? Yes  Type of Advance Directive Out of facility DNR (pink MOST or yellow form)  Does patient want to make changes to medical advance directive? No - Patient declined  Would patient like information on creating a medical advance directive? -  Pre-existing out of facility DNR order (yellow form or pink MOST form) Yellow form placed in chart (order not valid for inpatient use)     Chief Complaint  Patient presents with   Medical Management of Chronic Issues    Routine visit     HPI:  Pt is a 71 y.o. male seen today for medical management of chronic diseases. He is a long-term care resident of [facility].  Normochromic normocytic anemia - hgb 13.6, currently on FeSO4 325 mg BID  Idiopathic chronic gout of multiple sites without tophus  -  uric acid 5.2, takes Allopurinol 150 mg daily  Seizure-like activity (HCC) -  no recent seizure, takes Depakote DR 125 mg 1 capsule BID  Persistent atrial fibrillation (HCC) -  rate-controlled, takes Amiodarone 200 mg 1 tab daily and Diltiazem 90 1 tab Q 8 hours  Past Medical History:  Diagnosis Date   Chronic kidney disease    Chronic systolic CHF (congestive heart failure) (HCC)    EF 35-40, diffuse HK, mild MR, moderate LAE, mild RAE, PASP 44, L pleural eff   Dilated  cardiomyopathy ([place])    likely related to tachycardia   Dysrhythmia    Persistent atrial fibrillation [facility])    Past Surgical History:  Procedure Laterality Date   CARDIOVERSION N/A 08/20/2017   Procedure: CARDIOVERSION;  Surgeon: Jerline Pain, MD;  Location: [facility];  Service: Cardiovascular;  Laterality: N/A;   INCISION AND DRAINAGE ABSCESS Left 03/22/2014   Procedure: INCISION AND DRAINAGE ABSCESS LEFT BUTTOCK ABSCESS;  Surgeon: Zenovia Jarred, MD;  Location: [place];  Service: General;  Laterality: Left;    No Known Allergies  Outpatient Encounter Medications as of 10/04/2021  Medication Sig   allopurinol (ZYLOPRIM) 300 MG tablet Take 150 mg by mouth daily.   Amino Acids-Protein Hydrolys (PRO-STAT PO) Take 30 mLs by mouth 2 (two) times daily.   amiodarone (PACERONE) 400 MG tablet Take 400 mg by mouth daily. 400 mg TWICE A DAY FOR TWO WEEKS (05/18/2021-05/31/2021) 400 mg daily for 2 weeks (05/31/2021-06/14/2021 200 mg daily 09/14   apixaban (ELIQUIS) 5 MG TABS tablet Take 1 tablet (5 mg total) by mouth 2 (two) times daily.   atorvastatin (LIPITOR) 20 MG tablet Take 1 tablet (20 mg total) by mouth every evening.   bisacodyl (DULCOLAX) 10 MG suppository as needed. If not relieved by MOM, give 10 mg Bisacodyl suppositiory rectally X 1 dose in 24 hours as needed   Cholecalciferol (VITAMIN D) 50 MCG (2000 UT) tablet Take 2,000 Units by mouth daily.   colchicine 0.6 MG tablet Take 0.6 mg by mouth See admin instructions. 1.2 MG orally AT FIRST SIGN OF GOUT  FLARE UP AND REPEAT 0.6 MG IN 1 HOUR IF SYMPTOMS PERSIST   diltiazem (CARDIZEM) 90 MG tablet Take 90 mg by mouth every 8 (eight) hours. Hold for SBP < 100 and Heart rate < 60   divalproex (DEPAKOTE SPRINKLE) 125 MG capsule Take 125 mg by mouth 2 (two) times daily.   feeding supplement (ENSURE ENLIVE / ENSURE PLUS) LIQD Take 237 mLs by mouth 2 (two) times daily between meals.   ferrous sulfate 325 (65 FE) MG tablet Take 325 mg by mouth  2 (two) times daily with a meal.   Magnesium Hydroxide (MILK OF MAGNESIA PO) If no BM in 3 days, give 30 cc Milk of Magnesium p.o. x 1 dose in 24 hours as needed (Do not use standing constipation orders for residents with renal failure CFR less than 30. Contact MD for orders)   Menthol, Topical Analgesic, (BIOFREEZE) 4 % GEL Apply topically. APPLY TOPICALLY TO LEFT SHOULDER THREE TIMES DAILY FOR PAIN   midodrine (PROAMATINE) 10 MG tablet Take 1 tablet (10 mg total) by mouth 3 (three) times daily with meals.   NON FORMULARY MAGIC CUP: ONE MAGIC CUP DAILY FOR WOUND HEALING   NON FORMULARY Diet: Regular/thin diet consistency   pantoprazole (PROTONIX) 40 MG tablet Take 1 tablet (40 mg total) by mouth daily.   polyethylene glycol (MIRALAX / GLYCOLAX) 17 g packet Take 17 g by mouth daily.   Sodium Phosphates (RA SALINE ENEMA RE) If not relieved by MOM, give 10 mg Bisacodyl suppositiory rectally X 1 dose in 24 hours as needed   tamsulosin (FLOMAX) 0.4 MG CAPS capsule Take 1 capsule (0.4 mg total) by mouth daily after supper.   traMADol (ULTRAM) 50 MG tablet Take 0.5 tablets (25 mg total) by mouth 2 (two) times daily.   vitamin C (ASCORBIC ACID) 500 MG tablet Take 500 mg by mouth 2 (two) times daily.   zinc sulfate 220 (50 Zn) MG capsule Take 220 mg by mouth daily.   [DISCONTINUED] molnupiravir EUA (LAGEVRIO) 200 MG CAPS capsule Take 4 capsules by mouth 2 (two) times daily.   No facility-administered encounter medications on file as of 10/04/2021.    Review of Systems  Constitutional:  Negative for activity change, appetite change and fever.  HENT:  Negative for sore throat.   Eyes: Negative.   Cardiovascular:  Negative for chest pain and leg swelling.  Gastrointestinal:  Negative for abdominal distention, diarrhea and vomiting.  Genitourinary:  Negative for dysuria, frequency and urgency.  Skin:  Negative for color change.  Neurological:  Negative for dizziness and headaches.   Psychiatric/Behavioral:  Negative for behavioral problems and sleep disturbance. The patient is not nervous/anxious.       Immunization History  Administered Date(s) Administered   DTaP 12/14/2020   Moderna Covid-19 Vaccine Bivalent Booster 53yrs & up 09/28/2021   PFIZER(Purple Top)SARS-COV-2 Vaccination 12/07/2020   Pneumococcal Conjugate-13 10/28/2018   Pneumococcal Polysaccharide-23 09/08/2020   Tdap 12/14/2020   Pertinent  Health Maintenance Due  Topic Date Due   COLONOSCOPY (Pts 45-30yrs Insurance coverage will need to be confirmed)  Never done   INFLUENZA VACCINE  12/30/2021 (Originally 05/02/2021)   Fall Risk 03/22/2021 03/23/2021 03/25/2021 03/25/2021 03/31/2021  Falls in the past year? - - - - 0  Was there an injury with Fall? - - - - -  Fall Risk Category Calculator - - - - -  Fall Risk Category - - - - -  Patient Fall Risk Level High fall risk High fall  risk High fall risk High fall risk High fall risk  Patient at Risk for Falls Due to - - - - Impaired balance/gait;Impaired mobility  Fall risk Follow up - - - - Falls evaluation completed     Vitals:   10/04/21 0927  BP: 108/73  Pulse: 68  Resp: 18  Temp: 97.7 F (36.5 C)  Weight: 195 lb 9.6 oz (88.7 kg)  Height: 6' (1.829 m)   Body mass index is 26.53 kg/m.  Physical Exam Constitutional:      Appearance: Normal appearance.  HENT:     Head: Normocephalic and atraumatic.     Mouth/Throat:     Mouth: Mucous membranes are moist.  Eyes:     Conjunctiva/sclera: Conjunctivae normal.  Cardiovascular:     Rate and Rhythm: Normal rate. Rhythm irregular.     Pulses: Normal pulses.     Heart sounds: Normal heart sounds.  Pulmonary:     Effort: Pulmonary effort is normal.     Breath sounds: Normal breath sounds.  Abdominal:     General: Bowel sounds are normal.     Palpations: Abdomen is soft.  Musculoskeletal:        General: No swelling. Normal range of motion.     Cervical back: Normal range of motion.   Skin:    General: Skin is warm and dry.  Neurological:     General: No focal deficit present.     Mental Status: He is alert. Mental status is at baseline.     Comments: Alert to self, disoriented to time and place.  Psychiatric:        Mood and Affect: Mood normal.        Behavior: Behavior normal.       Labs reviewed: Recent Labs    11/10/20 1508 11/10/20 2000 03/15/21 0348 03/16/21 0320 03/19/21 2115 03/20/21 0429 03/21/21 0501 03/22/21 0445 03/23/21 0500 03/25/21 0908 03/29/21 0000 04/05/21 0000 07/20/21 0000 08/15/21 0000  NA 167*   < > 140 143   < > 142   < > 140 139 137   < > 139 142 142  K 4.1   < > 3.6 3.9   < > 4.7   < > 4.5 4.4 4.2   < > 4.0 4.3 4.5  CL >130*   < > 106 106   < > 108   < > 102 104 103   < > 102 105 107  CO2 20*   < > 26 28   < > 26   < > 28 27 24    < > 25* 28* 26*  GLUCOSE 152*   < > 105* 102*   < > 109*   < > 114* 127* 151*  --   --   --   --   BUN 85*   < > 23 22   < > 30*   < > 33* 33* 30*   < > 13 25* 29*  CREATININE 2.58*   < > 1.16 1.30*   < > 2.16*   < > 2.52* 2.47* 2.29*   < > 1.0 1.3 0.9  CALCIUM 8.5*   < > 8.5* 8.7*   < > 8.7*   < > 8.6* 8.5* 8.6*   < > 9.0 9.3 9.3  MG  --    < > 1.9 1.9  --  2.1  --   --   --   --   --   --   --   --  PHOS 3.5  --   --   --   --  4.9*  --   --   --   --   --   --   --   --    < > = values in this interval not displayed.   Recent Labs    03/16/21 0320 03/19/21 2115 03/20/21 0429  AST 13* 11* 10*  ALT 9 10 8   ALKPHOS 41 44 44  BILITOT 0.5 0.4 0.7  PROT 5.0* 5.4* 5.6*  ALBUMIN 2.0* 2.0* 2.0*   Recent Labs    03/22/21 0445 03/23/21 0500 03/25/21 0908 03/29/21 0000 04/05/21 0000 08/15/21 0000  WBC 13.4* 13.7* 11.0* 8.9 6.6 13.1  NEUTROABS 10.8* 11.0* 8.8*  --   --   --   HGB 9.6* 9.9* 10.7* 10.2* 10.7* 13.6  HCT 30.3* 31.4* 35.7* 31* 32* 41  MCV 84.4 84.2 88.1  --   --   --   PLT 440* 464* 481* 426* 359 400*   Lab Results  Component Value Date   TSH 5.052 (H) 03/21/2021   Lab  Results  Component Value Date   HGBA1C 5.3 03/11/2021   Lab Results  Component Value Date   CHOL 95 03/07/2021   HDL 24 (A) 03/07/2021   LDLCALC 44 03/07/2021   TRIG 137 03/07/2021   CHOLHDL 3.6 10/28/2018    Significant Diagnostic Results in last 30 days:  No results found.  Assessment/Plan  1. Normochromic normocytic anemia Lab Results  Component Value Date   HGB 13.6 08/15/2021   -  will discontinue FeSO4  2. Idiopathic chronic gout of multiple sites without tophus -  The current medical regimen is effective;  continue present plan and medications.  3. Seizure-like activity ([place]) -  no recent seizure, continue Depakote  4. Persistent atrial fibrillation (HCC) -  rate-controlled -   The current medical regimen is effective;  continue present plan and medications.     Family/ staff Communication: Discussed plan of care with resident and charge nurse  Labs/tests ordered:  CBC, CMP    Durenda Age, DNP, MSN, FNP-BC [facility] [phone] (Monday-Friday 8:00 a.m. - 5:00 p.m.) [phone] (after hours)

## 2021-10-05 DIAGNOSIS — R293 Abnormal posture: Secondary | ICD-10-CM | POA: Diagnosis not present

## 2021-10-05 DIAGNOSIS — I5022 Chronic systolic (congestive) heart failure: Secondary | ICD-10-CM | POA: Diagnosis not present

## 2021-10-05 DIAGNOSIS — M6281 Muscle weakness (generalized): Secondary | ICD-10-CM | POA: Diagnosis not present

## 2021-10-05 DIAGNOSIS — R2681 Unsteadiness on feet: Secondary | ICD-10-CM | POA: Diagnosis not present

## 2021-10-05 DIAGNOSIS — L89310 Pressure ulcer of right buttock, unstageable: Secondary | ICD-10-CM | POA: Diagnosis not present

## 2021-10-05 DIAGNOSIS — N179 Acute kidney failure, unspecified: Secondary | ICD-10-CM | POA: Diagnosis not present

## 2021-10-05 DIAGNOSIS — M6259 Muscle wasting and atrophy, not elsewhere classified, multiple sites: Secondary | ICD-10-CM | POA: Diagnosis not present

## 2021-10-05 LAB — BASIC METABOLIC PANEL
BUN: 21 (ref 4–21)
CO2: 26 — AB (ref 13–22)
Chloride: 105 (ref 99–108)
Creatinine: 1.3 (ref 0.6–1.3)
Glucose: 121
Potassium: 4.2 (ref 3.4–5.3)
Sodium: 140 (ref 137–147)

## 2021-10-05 LAB — HEPATIC FUNCTION PANEL
ALT: 14 (ref 10–40)
AST: 12 — AB (ref 14–40)
Alkaline Phosphatase: 106 (ref 25–125)

## 2021-10-05 LAB — CBC AND DIFFERENTIAL
HCT: 38 — AB (ref 41–53)
Hemoglobin: 12.3 — AB (ref 13.5–17.5)
Neutrophils Absolute: 4.5
Platelets: 284 (ref 150–399)
WBC: 7.5

## 2021-10-05 LAB — COMPREHENSIVE METABOLIC PANEL
Albumin: 3.3 — AB (ref 3.5–5.0)
Calcium: 8.8 (ref 8.7–10.7)
GFR calc Af Amer: 66.3
GFR calc non Af Amer: 57.21
Globulin: 2.2

## 2021-10-05 LAB — CBC: RBC: 4.34 (ref 3.87–5.11)

## 2021-10-06 DIAGNOSIS — R293 Abnormal posture: Secondary | ICD-10-CM | POA: Diagnosis not present

## 2021-10-06 DIAGNOSIS — M6281 Muscle weakness (generalized): Secondary | ICD-10-CM | POA: Diagnosis not present

## 2021-10-06 DIAGNOSIS — M6259 Muscle wasting and atrophy, not elsewhere classified, multiple sites: Secondary | ICD-10-CM | POA: Diagnosis not present

## 2021-10-06 DIAGNOSIS — L89310 Pressure ulcer of right buttock, unstageable: Secondary | ICD-10-CM | POA: Diagnosis not present

## 2021-10-06 DIAGNOSIS — R2681 Unsteadiness on feet: Secondary | ICD-10-CM | POA: Diagnosis not present

## 2021-10-06 DIAGNOSIS — I5022 Chronic systolic (congestive) heart failure: Secondary | ICD-10-CM | POA: Diagnosis not present

## 2021-10-07 DIAGNOSIS — R293 Abnormal posture: Secondary | ICD-10-CM | POA: Diagnosis not present

## 2021-10-07 DIAGNOSIS — A419 Sepsis, unspecified organism: Secondary | ICD-10-CM | POA: Diagnosis not present

## 2021-10-07 DIAGNOSIS — R279 Unspecified lack of coordination: Secondary | ICD-10-CM | POA: Diagnosis not present

## 2021-10-07 DIAGNOSIS — I5022 Chronic systolic (congestive) heart failure: Secondary | ICD-10-CM | POA: Diagnosis not present

## 2021-10-07 DIAGNOSIS — M8618 Other acute osteomyelitis, other site: Secondary | ICD-10-CM | POA: Diagnosis not present

## 2021-10-07 DIAGNOSIS — L89310 Pressure ulcer of right buttock, unstageable: Secondary | ICD-10-CM | POA: Diagnosis not present

## 2021-10-07 DIAGNOSIS — M6281 Muscle weakness (generalized): Secondary | ICD-10-CM | POA: Diagnosis not present

## 2021-10-07 DIAGNOSIS — M6259 Muscle wasting and atrophy, not elsewhere classified, multiple sites: Secondary | ICD-10-CM | POA: Diagnosis not present

## 2021-10-07 DIAGNOSIS — M4628 Osteomyelitis of vertebra, sacral and sacrococcygeal region: Secondary | ICD-10-CM | POA: Diagnosis not present

## 2021-10-07 DIAGNOSIS — R2681 Unsteadiness on feet: Secondary | ICD-10-CM | POA: Diagnosis not present

## 2021-10-10 DIAGNOSIS — I5022 Chronic systolic (congestive) heart failure: Secondary | ICD-10-CM | POA: Diagnosis not present

## 2021-10-10 DIAGNOSIS — M6259 Muscle wasting and atrophy, not elsewhere classified, multiple sites: Secondary | ICD-10-CM | POA: Diagnosis not present

## 2021-10-10 DIAGNOSIS — R2681 Unsteadiness on feet: Secondary | ICD-10-CM | POA: Diagnosis not present

## 2021-10-10 DIAGNOSIS — L89310 Pressure ulcer of right buttock, unstageable: Secondary | ICD-10-CM | POA: Diagnosis not present

## 2021-10-10 DIAGNOSIS — M4628 Osteomyelitis of vertebra, sacral and sacrococcygeal region: Secondary | ICD-10-CM | POA: Diagnosis not present

## 2021-10-10 DIAGNOSIS — M8618 Other acute osteomyelitis, other site: Secondary | ICD-10-CM | POA: Diagnosis not present

## 2021-10-10 DIAGNOSIS — M6281 Muscle weakness (generalized): Secondary | ICD-10-CM | POA: Diagnosis not present

## 2021-10-10 DIAGNOSIS — R293 Abnormal posture: Secondary | ICD-10-CM | POA: Diagnosis not present

## 2021-10-10 DIAGNOSIS — R279 Unspecified lack of coordination: Secondary | ICD-10-CM | POA: Diagnosis not present

## 2021-10-10 DIAGNOSIS — A419 Sepsis, unspecified organism: Secondary | ICD-10-CM | POA: Diagnosis not present

## 2021-10-11 ENCOUNTER — Non-Acute Institutional Stay (SKILLED_NURSING_FACILITY): Payer: Medicare Other | Admitting: Adult Health

## 2021-10-11 ENCOUNTER — Encounter: Payer: Self-pay | Admitting: Adult Health

## 2021-10-11 DIAGNOSIS — F0150 Vascular dementia without behavioral disturbance: Secondary | ICD-10-CM

## 2021-10-11 DIAGNOSIS — Z79899 Other long term (current) drug therapy: Secondary | ICD-10-CM | POA: Diagnosis not present

## 2021-10-11 DIAGNOSIS — M6281 Muscle weakness (generalized): Secondary | ICD-10-CM | POA: Diagnosis not present

## 2021-10-11 DIAGNOSIS — L89310 Pressure ulcer of right buttock, unstageable: Secondary | ICD-10-CM | POA: Diagnosis not present

## 2021-10-11 DIAGNOSIS — M6259 Muscle wasting and atrophy, not elsewhere classified, multiple sites: Secondary | ICD-10-CM | POA: Diagnosis not present

## 2021-10-11 DIAGNOSIS — I5022 Chronic systolic (congestive) heart failure: Secondary | ICD-10-CM | POA: Diagnosis not present

## 2021-10-11 DIAGNOSIS — R2681 Unsteadiness on feet: Secondary | ICD-10-CM | POA: Diagnosis not present

## 2021-10-11 DIAGNOSIS — R293 Abnormal posture: Secondary | ICD-10-CM | POA: Diagnosis not present

## 2021-10-11 NOTE — Progress Notes (Signed)
Location:  [facility] Room Number: 106 A Place of Service:  SNF (31) Provider:  Kenard Gower, DNP, FNP-BC  Patient Care Team: Pecola Lawless, MD as PCP - General (Internal Medicine) Regan Lemming, MD as PCP - Electrophysiology (Cardiology) Medina-Vargas, Margit Banda, NP as Nurse Practitioner (Internal Medicine)  Extended Emergency Contact Information Primary Emergency Contact: Glendora Score [place] Home Phone: [phone] Relation: Daughter Secondary Emergency Contact: SAPP,RON Home Phone: [phone] Relation: Other  Code Status:  DNR  Goals of care: Advanced Directive information Advanced Directives 10/04/2021  Does Patient Have a Medical Advance Directive? Yes  Type of Advance Directive Out of facility DNR (pink MOST or yellow form)  Does patient want to make changes to medical advance directive? No - Patient declined  Would patient like information on creating a medical advance directive? -  Pre-existing out of facility DNR order (yellow form or pink MOST form) Yellow form placed in chart (order not valid for inpatient use)     Chief Complaint  Patient presents with   Acute Visit    Medication management    HPI:  Pt is a 71 y.o. male seen today for an acute visit. He is a long-term care resident of [facility]. He has a PMH of chronic atrial fibrillation, chronic kidney disease stage III, hypertension, prediabetes, gout and sacral osteomyelitis. He had COVID-19 on 08/08/21 and was started on Zinc sulfate. COVID-19 has now resolved. Latest BIMS score is 7/15, ranging in severe cognitive impairment. He has Vascular dementia.   Past Medical History:  Diagnosis Date   Chronic kidney disease    Chronic systolic CHF (congestive heart failure) (HCC)    EF 35-40, diffuse HK, mild MR, moderate LAE, mild RAE, PASP 44, L pleural eff   Dilated cardiomyopathy (HCC)    likely related to tachycardia    Dysrhythmia    Persistent atrial fibrillation [facility])    Past Surgical History:  Procedure Laterality Date   CARDIOVERSION N/A 08/20/2017   Procedure: CARDIOVERSION;  Surgeon: [name], MD;  Location: [facility] ENDOSCOPY;  Service: Cardiovascular;  Laterality: N/A;   INCISION AND DRAINAGE ABSCESS Left 03/22/2014   Procedure: INCISION AND DRAINAGE ABSCESS LEFT BUTTOCK ABSCESS;  Surgeon: [name], MD;  Location: [facility] OR;  Service: General;  Laterality: Left;    No Known Allergies  Outpatient Encounter Medications as of 10/11/2021  Medication Sig   allopurinol (ZYLOPRIM) 300 MG tablet Take 150 mg by mouth daily.   Amino Acids-Protein Hydrolys (PRO-STAT PO) Take 30 mLs by mouth 2 (two) times daily.   amiodarone (PACERONE) 400 MG tablet Take 400 mg by mouth daily. 400 mg TWICE A DAY FOR TWO WEEKS (05/18/2021-05/31/2021) 400 mg daily for 2 weeks (05/31/2021-06/14/2021 200 mg daily 09/14   apixaban (ELIQUIS) 5 MG TABS tablet Take 1 tablet (5 mg total) by mouth 2 (two) times daily.   atorvastatin (LIPITOR) 20 MG tablet Take 1 tablet (20 mg total) by mouth every evening.   bisacodyl (DULCOLAX) 10 MG suppository as needed. If not relieved by MOM, give 10 mg Bisacodyl suppositiory rectally X 1 dose in 24 hours as needed   Cholecalciferol (VITAMIN D) 50 MCG (2000 UT) tablet Take 2,000 Units by mouth daily.   colchicine 0.6 MG tablet Take 0.6 mg by mouth See admin instructions. 1.2 MG orally AT FIRST SIGN OF GOUT FLARE UP AND REPEAT 0.6 MG IN 1 HOUR IF SYMPTOMS PERSIST   diltiazem (CARDIZEM) 90 MG tablet  Take 90 mg by mouth every 8 (eight) hours. Hold for SBP < 100 and Heart rate < 60   divalproex (DEPAKOTE SPRINKLE) 125 MG capsule Take 125 mg by mouth 2 (two) times daily.   feeding supplement (ENSURE ENLIVE / ENSURE PLUS) LIQD Take 237 mLs by mouth 2 (two) times daily between meals.   ferrous sulfate 325 (65 FE) MG tablet Take 325 mg by mouth 2 (two) times daily with a meal.   Magnesium Hydroxide  (MILK OF MAGNESIA PO) If no BM in 3 days, give 30 cc Milk of Magnesium p.o. x 1 dose in 24 hours as needed (Do not use standing constipation orders for residents with renal failure CFR less than 30. Contact MD for orders)   Menthol, Topical Analgesic, (BIOFREEZE) 4 % GEL Apply topically. APPLY TOPICALLY TO LEFT SHOULDER THREE TIMES DAILY FOR PAIN   midodrine (PROAMATINE) 10 MG tablet Take 1 tablet (10 mg total) by mouth 3 (three) times daily with meals.   NON FORMULARY MAGIC CUP: ONE MAGIC CUP DAILY FOR WOUND HEALING   NON FORMULARY Diet: Regular/thin diet consistency   pantoprazole (PROTONIX) 40 MG tablet Take 1 tablet (40 mg total) by mouth daily.   polyethylene glycol (MIRALAX / GLYCOLAX) 17 g packet Take 17 g by mouth daily.   Sodium Phosphates (RA SALINE ENEMA RE) If not relieved by MOM, give 10 mg Bisacodyl suppositiory rectally X 1 dose in 24 hours as needed   tamsulosin (FLOMAX) 0.4 MG CAPS capsule Take 1 capsule (0.4 mg total) by mouth daily after supper.   traMADol (ULTRAM) 50 MG tablet Take 0.5 tablets (25 mg total) by mouth 2 (two) times daily.   vitamin C (ASCORBIC ACID) 500 MG tablet Take 500 mg by mouth 2 (two) times daily.   [DISCONTINUED] zinc sulfate 220 (50 Zn) MG capsule Take 220 mg by mouth daily.   No facility-administered encounter medications on file as of 10/11/2021.    Review of Systems  Constitutional:  Negative for activity change, appetite change and fever.  HENT:  Negative for sore throat.   Eyes: Negative.   Cardiovascular:  Negative for chest pain and leg swelling.  Gastrointestinal:  Negative for abdominal distention, diarrhea and vomiting.  Genitourinary:  Negative for dysuria, frequency and urgency.  Skin:  Negative for color change.  Neurological:  Negative for dizziness and headaches.  Psychiatric/Behavioral:  Negative for behavioral problems and sleep disturbance. The patient is not nervous/anxious.       Immunization History  Administered Date(s)  Administered   DTaP 12/14/2020   Moderna Covid-19 Vaccine Bivalent Booster 86yrs & up 09/28/2021   Moderna Sars-Covid-2 Vaccination 03/10/2021   PFIZER(Purple Top)SARS-COV-2 Vaccination 12/07/2020   Pneumococcal Conjugate-13 10/28/2018   Pneumococcal Polysaccharide-23 09/08/2020   Tdap 12/14/2020   Pertinent  Health Maintenance Due  Topic Date Due   COLONOSCOPY (Pts 45-63yrs Insurance coverage will need to be confirmed)  Never done   INFLUENZA VACCINE  12/30/2021 (Originally 05/02/2021)   Fall Risk 03/22/2021 03/23/2021 03/25/2021 03/25/2021 03/31/2021  Falls in the past year? - - - - 0  Was there an injury with Fall? - - - - -  Fall Risk Category Calculator - - - - -  Fall Risk Category - - - - -  Patient Fall Risk Level High fall risk High fall risk High fall risk High fall risk High fall risk  Patient at Risk for Falls Due to - - - - Impaired balance/gait;Impaired mobility  Fall risk Follow up - - - -  Falls evaluation completed     Vitals:   10/11/21 1657  BP: 127/84  Pulse: 67  Resp: 18  Temp: 97.9 F (36.6 C)  Weight: 199 lb 9.6 oz (90.5 kg)  Height: 6' (1.829 m)   Body mass index is 27.07 kg/m.  Physical Exam Constitutional:      Appearance: Normal appearance.  HENT:     Head: Normocephalic and atraumatic.     Mouth/Throat:     Mouth: Mucous membranes are moist.  Eyes:     Conjunctiva/sclera: Conjunctivae normal.  Cardiovascular:     Rate and Rhythm: Normal rate. Rhythm irregular.     Pulses: Normal pulses.     Heart sounds: Normal heart sounds.  Pulmonary:     Effort: Pulmonary effort is normal.     Breath sounds: Normal breath sounds.  Abdominal:     General: Bowel sounds are normal.     Palpations: Abdomen is soft.  Musculoskeletal:        General: No swelling. Normal range of motion.  Skin:    General: Skin is warm and dry.  Neurological:     Mental Status: He is alert. Mental status is at baseline. He is disoriented.  Psychiatric:        Mood and  Affect: Mood normal.       Labs reviewed: Recent Labs    11/10/20 1508 11/10/20 2000 03/15/21 0348 03/16/21 0320 03/19/21 2115 03/20/21 0429 03/21/21 0501 03/22/21 0445 03/23/21 0500 03/25/21 0908 03/29/21 0000 04/05/21 0000 07/20/21 0000 08/15/21 0000  NA 167*   < > 140 143   < > 142   < > 140 139 137   < > 139 142 142  K 4.1   < > 3.6 3.9   < > 4.7   < > 4.5 4.4 4.2   < > 4.0 4.3 4.5  CL >130*   < > 106 106   < > 108   < > 102 104 103   < > 102 105 107  CO2 20*   < > 26 28   < > 26   < > 28 27 24    < > 25* 28* 26*  GLUCOSE 152*   < > 105* 102*   < > 109*   < > 114* 127* 151*  --   --   --   --   BUN 85*   < > 23 22   < > 30*   < > 33* 33* 30*   < > 13 25* 29*  CREATININE 2.58*   < > 1.16 1.30*   < > 2.16*   < > 2.52* 2.47* 2.29*   < > 1.0 1.3 0.9  CALCIUM 8.5*   < > 8.5* 8.7*   < > 8.7*   < > 8.6* 8.5* 8.6*   < > 9.0 9.3 9.3  MG  --    < > 1.9 1.9  --  2.1  --   --   --   --   --   --   --   --   PHOS 3.5  --   --   --   --  4.9*  --   --   --   --   --   --   --   --    < > = values in this interval not displayed.   Recent Labs    03/16/21 0320 03/19/21 2115 03/20/21 0429  AST 13* 11* 10*  ALT 9 10 8   ALKPHOS 41 44 44  BILITOT 0.5 0.4 0.7  PROT 5.0* 5.4* 5.6*  ALBUMIN 2.0* 2.0* 2.0*   Recent Labs    03/22/21 0445 03/23/21 0500 03/25/21 0908 03/29/21 0000 04/05/21 0000 08/15/21 0000  WBC 13.4* 13.7* 11.0* 8.9 6.6 13.1  NEUTROABS 10.8* 11.0* 8.8*  --   --   --   HGB 9.6* 9.9* 10.7* 10.2* 10.7* 13.6  HCT 30.3* 31.4* 35.7* 31* 32* 41  MCV 84.4 84.2 88.1  --   --   --   PLT 440* 464* 481* 426* 359 400*   Lab Results  Component Value Date   TSH 5.052 (H) 03/21/2021   Lab Results  Component Value Date   HGBA1C 5.3 03/11/2021   Lab Results  Component Value Date   CHOL 95 03/07/2021   HDL 24 (A) 03/07/2021   LDLCALC 44 03/07/2021   TRIG 137 03/07/2021   CHOLHDL 3.6 10/28/2018    Significant Diagnostic Results in last 30 days:  No results  found.  Assessment/Plan  1. Medication management -  discontinue zinc sulfate  2. Vascular dementia without behavioral disturbance ([place]) -  continue supportive care - Do not attempt resuscitation (DNR)    Family/ staff Communication: Discussed plan of care with resident and charge nurse  Labs/tests ordered: None    Durenda Age, DNP, MSN, FNP-BC [facility] [phone] (Monday-Friday 8:00 a.m. - 5:00 p.m.) [phone] (after hours)

## 2021-10-12 ENCOUNTER — Encounter: Payer: Self-pay | Admitting: Adult Health

## 2021-10-12 DIAGNOSIS — M6259 Muscle wasting and atrophy, not elsewhere classified, multiple sites: Secondary | ICD-10-CM | POA: Diagnosis not present

## 2021-10-12 DIAGNOSIS — I5022 Chronic systolic (congestive) heart failure: Secondary | ICD-10-CM | POA: Diagnosis not present

## 2021-10-12 DIAGNOSIS — E559 Vitamin D deficiency, unspecified: Secondary | ICD-10-CM | POA: Diagnosis not present

## 2021-10-12 DIAGNOSIS — A419 Sepsis, unspecified organism: Secondary | ICD-10-CM | POA: Diagnosis not present

## 2021-10-12 DIAGNOSIS — E119 Type 2 diabetes mellitus without complications: Secondary | ICD-10-CM | POA: Diagnosis not present

## 2021-10-12 DIAGNOSIS — L89310 Pressure ulcer of right buttock, unstageable: Secondary | ICD-10-CM | POA: Diagnosis not present

## 2021-10-12 DIAGNOSIS — M4628 Osteomyelitis of vertebra, sacral and sacrococcygeal region: Secondary | ICD-10-CM | POA: Diagnosis not present

## 2021-10-12 DIAGNOSIS — R55 Syncope and collapse: Secondary | ICD-10-CM | POA: Diagnosis not present

## 2021-10-12 DIAGNOSIS — R2681 Unsteadiness on feet: Secondary | ICD-10-CM | POA: Diagnosis not present

## 2021-10-12 DIAGNOSIS — M6281 Muscle weakness (generalized): Secondary | ICD-10-CM | POA: Diagnosis not present

## 2021-10-12 DIAGNOSIS — R293 Abnormal posture: Secondary | ICD-10-CM | POA: Diagnosis not present

## 2021-10-12 LAB — CBC AND DIFFERENTIAL
HCT: 38 — AB (ref 41–53)
Hemoglobin: 13 — AB (ref 13.5–17.5)
Neutrophils Absolute: 5.6
Platelets: 300 (ref 150–399)
WBC: 8

## 2021-10-12 LAB — BASIC METABOLIC PANEL
BUN: 21 (ref 4–21)
CO2: 26 — AB (ref 13–22)
Chloride: 104 (ref 99–108)
Creatinine: 1.3 (ref 0.6–1.3)
Glucose: 77
Potassium: 4.3 (ref 3.4–5.3)
Sodium: 139 (ref 137–147)

## 2021-10-12 LAB — COMPREHENSIVE METABOLIC PANEL: Calcium: 8.5 — AB (ref 8.7–10.7)

## 2021-10-12 LAB — LIPID PANEL
Cholesterol: 135 (ref 0–200)
HDL: 31 — AB (ref 35–70)
LDL Cholesterol: 81
Triglycerides: 120 (ref 40–160)

## 2021-10-12 LAB — CBC: RBC: 4.45 (ref 3.87–5.11)

## 2021-10-13 DIAGNOSIS — L89310 Pressure ulcer of right buttock, unstageable: Secondary | ICD-10-CM | POA: Diagnosis not present

## 2021-10-13 DIAGNOSIS — R293 Abnormal posture: Secondary | ICD-10-CM | POA: Diagnosis not present

## 2021-10-13 DIAGNOSIS — M6259 Muscle wasting and atrophy, not elsewhere classified, multiple sites: Secondary | ICD-10-CM | POA: Diagnosis not present

## 2021-10-13 DIAGNOSIS — I5022 Chronic systolic (congestive) heart failure: Secondary | ICD-10-CM | POA: Diagnosis not present

## 2021-10-13 DIAGNOSIS — M6281 Muscle weakness (generalized): Secondary | ICD-10-CM | POA: Diagnosis not present

## 2021-10-13 DIAGNOSIS — R2681 Unsteadiness on feet: Secondary | ICD-10-CM | POA: Diagnosis not present

## 2021-10-14 ENCOUNTER — Other Ambulatory Visit: Payer: Self-pay | Admitting: Adult Health

## 2021-10-14 DIAGNOSIS — R293 Abnormal posture: Secondary | ICD-10-CM | POA: Diagnosis not present

## 2021-10-14 DIAGNOSIS — L89310 Pressure ulcer of right buttock, unstageable: Secondary | ICD-10-CM | POA: Diagnosis not present

## 2021-10-14 DIAGNOSIS — R2681 Unsteadiness on feet: Secondary | ICD-10-CM | POA: Diagnosis not present

## 2021-10-14 DIAGNOSIS — M6259 Muscle wasting and atrophy, not elsewhere classified, multiple sites: Secondary | ICD-10-CM | POA: Diagnosis not present

## 2021-10-14 DIAGNOSIS — I5022 Chronic systolic (congestive) heart failure: Secondary | ICD-10-CM | POA: Diagnosis not present

## 2021-10-14 DIAGNOSIS — M6281 Muscle weakness (generalized): Secondary | ICD-10-CM | POA: Diagnosis not present

## 2021-10-14 MED ORDER — TRAMADOL HCL 50 MG PO TABS: 25.0000 mg | ORAL_TABLET | Freq: Two times a day (BID) | ORAL | 0 refills | Status: DC

## 2021-10-15 DIAGNOSIS — M6281 Muscle weakness (generalized): Secondary | ICD-10-CM | POA: Diagnosis not present

## 2021-10-15 DIAGNOSIS — I5022 Chronic systolic (congestive) heart failure: Secondary | ICD-10-CM | POA: Diagnosis not present

## 2021-10-15 DIAGNOSIS — R2681 Unsteadiness on feet: Secondary | ICD-10-CM | POA: Diagnosis not present

## 2021-10-15 DIAGNOSIS — R293 Abnormal posture: Secondary | ICD-10-CM | POA: Diagnosis not present

## 2021-10-15 DIAGNOSIS — L89310 Pressure ulcer of right buttock, unstageable: Secondary | ICD-10-CM | POA: Diagnosis not present

## 2021-10-15 DIAGNOSIS — M6259 Muscle wasting and atrophy, not elsewhere classified, multiple sites: Secondary | ICD-10-CM | POA: Diagnosis not present

## 2021-10-15 LAB — VITAMIN D 25 HYDROXY (VIT D DEFICIENCY, FRACTURES): Vit D, 25-Hydroxy: 20

## 2021-10-17 DIAGNOSIS — M4628 Osteomyelitis of vertebra, sacral and sacrococcygeal region: Secondary | ICD-10-CM | POA: Diagnosis not present

## 2021-10-17 DIAGNOSIS — M6281 Muscle weakness (generalized): Secondary | ICD-10-CM | POA: Diagnosis not present

## 2021-10-17 DIAGNOSIS — R279 Unspecified lack of coordination: Secondary | ICD-10-CM | POA: Diagnosis not present

## 2021-10-17 DIAGNOSIS — M8618 Other acute osteomyelitis, other site: Secondary | ICD-10-CM | POA: Diagnosis not present

## 2021-10-17 DIAGNOSIS — R2681 Unsteadiness on feet: Secondary | ICD-10-CM | POA: Diagnosis not present

## 2021-10-17 DIAGNOSIS — I5022 Chronic systolic (congestive) heart failure: Secondary | ICD-10-CM | POA: Diagnosis not present

## 2021-10-17 DIAGNOSIS — A419 Sepsis, unspecified organism: Secondary | ICD-10-CM | POA: Diagnosis not present

## 2021-10-17 DIAGNOSIS — L89310 Pressure ulcer of right buttock, unstageable: Secondary | ICD-10-CM | POA: Diagnosis not present

## 2021-10-17 DIAGNOSIS — M6259 Muscle wasting and atrophy, not elsewhere classified, multiple sites: Secondary | ICD-10-CM | POA: Diagnosis not present

## 2021-10-17 DIAGNOSIS — R293 Abnormal posture: Secondary | ICD-10-CM | POA: Diagnosis not present

## 2021-10-18 ENCOUNTER — Encounter: Payer: Self-pay | Admitting: Internal Medicine

## 2021-10-18 ENCOUNTER — Non-Acute Institutional Stay (SKILLED_NURSING_FACILITY): Payer: Medicare Other | Admitting: Internal Medicine

## 2021-10-18 DIAGNOSIS — R2681 Unsteadiness on feet: Secondary | ICD-10-CM | POA: Diagnosis not present

## 2021-10-18 DIAGNOSIS — R55 Syncope and collapse: Secondary | ICD-10-CM

## 2021-10-18 DIAGNOSIS — I4891 Unspecified atrial fibrillation: Secondary | ICD-10-CM

## 2021-10-18 DIAGNOSIS — E538 Deficiency of other specified B group vitamins: Secondary | ICD-10-CM

## 2021-10-18 DIAGNOSIS — R293 Abnormal posture: Secondary | ICD-10-CM | POA: Diagnosis not present

## 2021-10-18 DIAGNOSIS — L89310 Pressure ulcer of right buttock, unstageable: Secondary | ICD-10-CM | POA: Diagnosis not present

## 2021-10-18 DIAGNOSIS — M6259 Muscle wasting and atrophy, not elsewhere classified, multiple sites: Secondary | ICD-10-CM | POA: Diagnosis not present

## 2021-10-18 DIAGNOSIS — R7989 Other specified abnormal findings of blood chemistry: Secondary | ICD-10-CM | POA: Diagnosis not present

## 2021-10-18 DIAGNOSIS — M6281 Muscle weakness (generalized): Secondary | ICD-10-CM | POA: Diagnosis not present

## 2021-10-18 DIAGNOSIS — I10 Essential (primary) hypertension: Secondary | ICD-10-CM

## 2021-10-18 DIAGNOSIS — I5022 Chronic systolic (congestive) heart failure: Secondary | ICD-10-CM | POA: Diagnosis not present

## 2021-10-18 DIAGNOSIS — D649 Anemia, unspecified: Secondary | ICD-10-CM | POA: Diagnosis not present

## 2021-10-18 NOTE — Assessment & Plan Note (Signed)
10/12/2021 H/H 13/38.2 with normochromic, normocytic indices.  On 08/15/2021 H/H 13.6/41.  Iron panel, B12, folate, and FOBT will be ordered.

## 2021-10-18 NOTE — Assessment & Plan Note (Addendum)
TIA vs absence seizure. Check Depakote level. Cardiology follow-up is scheduled for 11/01/2021.

## 2021-10-18 NOTE — Assessment & Plan Note (Signed)
On 10/15/2020 blood pressure was 96/64.  This is in the context of mild normocytic, normochromic anemia.  He is on low-dose rate limiting calcium channel blocker for chronic A. fib.

## 2021-10-18 NOTE — Assessment & Plan Note (Signed)
Heart sounds are distant and the rhythm is difficult to discern but appears slightly irregular and slightly tachycardic.  Most recent EKGs reviewed which did reveal low voltage, electrical interference, and atrial fib with slight rhythm variance.

## 2021-10-18 NOTE — Assessment & Plan Note (Signed)
Nutritional intake reported is good at the SNF.  Folate will be updated to verify resolution.

## 2021-10-18 NOTE — Progress Notes (Signed)
NURSING HOME LOCATION:  [facility] [place] ROOM NUMBER:  106 A  CODE STATUS:  DNR  PCP:  Durenda Age NP,PSC  This is a nursing facility follow up visit for specific acute issue of normochromic, normocytic anemia.   Interim medical record and care since last SNF visit was updated with review of diagnostic studies and change in clinical status since last visit were documented.  HPI: Medication maintenance exam was completed 10/11/2021 by the [facility] NP.  CBC and differential was collected.  Hemoglobin/hematocrit was 13/38.2 with normochromic, normocytic indices.  The most recent H/H prior to this was 08/15/2021 with values of 13.6/41.0.  The old chart was reviewed; on 11/14/2020 folate level was 5.1.  There is no B12 or iron panel in the chart. The patient's vascular dementia precludes review of systems assessment of any bleeding dyscrasias.  He is on Eliquis.  His nurse reports no bleeding dyscrasias.   Also 10/12/2021 BMET was performed which revealed a creatinine of 1.29 with a GFR of 60 indicating borderline high stage II/IIIa. He did have an episode of hypotension with a blood pressure of 96/64 on 1/14 and syncope sometime last week.  Supposedly he was in the wheelchair accompanied by a Physical Therapist when he stated that he was unable to talk for 2-3 minutes.  He describes "vomiting a few times" a scant amount of material.  He also stated that he was profoundly pale. Cardiology follow-up is scheduled for 1/31.  Past history includes persistent atrial fibrillation for which he is on amiodarone and the Eliquis.  This is in the context of a history of dilated cardiomyopathy with chronic systolic congestive heart failure.  He is on a PPI apparently for history of GERD.  Review of systems: He denies any cardiac or neurologic prodrome prior to the event.  His responses are questionable at best as he is unable to give the date, even the year.  He incorrectly  identified the staff member working with him as a man rather than a woman.  Constitutional: No fever, significant weight change, fatigue  Eyes: No redness, discharge, pain, vision change ENT/mouth: No nasal congestion,  purulent discharge, earache, change in hearing, sore throat  Cardiovascular: No chest pain, palpitations, paroxysmal nocturnal dyspnea, edema  Respiratory: No cough, sputum production, hemoptysis, DOE, significant snoring, apnea   Gastrointestinal: No heartburn, dysphagia, abdominal pain,rectal bleeding, melena, change in bowels Genitourinary: No dysuria, hematuria, pyuria, incontinence, nocturia Musculoskeletal: No joint stiffness, joint swelling, weakness, pain Dermatologic: No rash, pruritus, change in appearance of skin Neurologic: No dizziness, headache, definite syncope, seizures Psychiatric: No significant anxiety, depression, insomnia, anorexia Endocrine: No change in hair/skin/nails, excessive thirst, excessive hunger, excessive urination  Hematologic/lymphatic: No significant bruising, lymphadenopathy, abnormal bleeding  Physical exam:  Pertinent or positive findings: Pattern alopecia is present.  His voice is markedly weak.  Heart sounds are markedly distant and the rhythm is difficult to discern but it may be slightly irregular with slight tachycardia suggested.   Note: Most recent EKGs were reviewed and do show A. fib with marked electrical interference and diffuse low voltage.  Although A. fib is present; the rhythm is not dramatically irregular on the most recent EKGs. Breath sounds are also decreased.  1/2+ pedal edema is present.  Posterior tibial pulses & the dorsalis pedis pulses are palpable but both are decreased.  Upper extremities are stronger than lower extremities to opposition.  General appearance: Adequately nourished; no acute distress, increased work of breathing is present.  Lymphatic: No lymphadenopathy about the head, neck, axilla. Eyes: No  conjunctival inflammation or lid edema is present. There is no scleral icterus. Ears:  External ear exam shows no significant lesions or deformities.   Nose:  External nasal examination shows no deformity or inflammation. Nasal mucosa are pink and moist without lesions, exudates Oral exam:  Lips and gums are healthy appearing. There is no oropharyngeal erythema or exudate. Neck:  No thyromegaly, masses, tenderness noted.    Heart:  No murmur, click, rub .  Lungs: without wheezes, rhonchi, rales, rubs. Abdomen: Bowel sounds are normal. Abdomen is soft and nontender with no organomegaly, hernias, masses. GU: Deferred  Extremities:  No cyanosis, clubbing  Neurologic exam :Balance, Rhomberg, finger to nose testing could not be completed due to clinical state Skin: Warm & dry w/o tenting. No significant lesions or rash.  See summary under each active problem in the Problem List with associated updated therapeutic plan

## 2021-10-18 NOTE — Assessment & Plan Note (Signed)
Last TSH was in June; this will be updated.

## 2021-10-18 NOTE — Patient Instructions (Signed)
See assessment and plan under each diagnosis in the problem list and acutely for this visit 

## 2021-10-19 DIAGNOSIS — I5022 Chronic systolic (congestive) heart failure: Secondary | ICD-10-CM | POA: Diagnosis not present

## 2021-10-19 DIAGNOSIS — M6281 Muscle weakness (generalized): Secondary | ICD-10-CM | POA: Diagnosis not present

## 2021-10-19 DIAGNOSIS — R293 Abnormal posture: Secondary | ICD-10-CM | POA: Diagnosis not present

## 2021-10-19 DIAGNOSIS — L89310 Pressure ulcer of right buttock, unstageable: Secondary | ICD-10-CM | POA: Diagnosis not present

## 2021-10-19 DIAGNOSIS — M6259 Muscle wasting and atrophy, not elsewhere classified, multiple sites: Secondary | ICD-10-CM | POA: Diagnosis not present

## 2021-10-19 DIAGNOSIS — R2681 Unsteadiness on feet: Secondary | ICD-10-CM | POA: Diagnosis not present

## 2021-10-20 DIAGNOSIS — R2681 Unsteadiness on feet: Secondary | ICD-10-CM | POA: Diagnosis not present

## 2021-10-20 DIAGNOSIS — L89310 Pressure ulcer of right buttock, unstageable: Secondary | ICD-10-CM | POA: Diagnosis not present

## 2021-10-20 DIAGNOSIS — D519 Vitamin B12 deficiency anemia, unspecified: Secondary | ICD-10-CM | POA: Diagnosis not present

## 2021-10-20 DIAGNOSIS — R293 Abnormal posture: Secondary | ICD-10-CM | POA: Diagnosis not present

## 2021-10-20 DIAGNOSIS — I5022 Chronic systolic (congestive) heart failure: Secondary | ICD-10-CM | POA: Diagnosis not present

## 2021-10-20 DIAGNOSIS — M6259 Muscle wasting and atrophy, not elsewhere classified, multiple sites: Secondary | ICD-10-CM | POA: Diagnosis not present

## 2021-10-20 DIAGNOSIS — E119 Type 2 diabetes mellitus without complications: Secondary | ICD-10-CM | POA: Diagnosis not present

## 2021-10-20 DIAGNOSIS — I10 Essential (primary) hypertension: Secondary | ICD-10-CM | POA: Diagnosis not present

## 2021-10-20 DIAGNOSIS — M6281 Muscle weakness (generalized): Secondary | ICD-10-CM | POA: Diagnosis not present

## 2021-10-20 LAB — IRON,TIBC AND FERRITIN PANEL
Iron: 78
TIBC: 241
UIBC: 163

## 2021-10-20 LAB — TSH: TSH: 7 — AB (ref 0.41–5.90)

## 2021-10-20 LAB — VITAMIN B12: Vitamin B-12: 503

## 2021-10-21 DIAGNOSIS — M6259 Muscle wasting and atrophy, not elsewhere classified, multiple sites: Secondary | ICD-10-CM | POA: Diagnosis not present

## 2021-10-21 DIAGNOSIS — R293 Abnormal posture: Secondary | ICD-10-CM | POA: Diagnosis not present

## 2021-10-21 DIAGNOSIS — R2681 Unsteadiness on feet: Secondary | ICD-10-CM | POA: Diagnosis not present

## 2021-10-21 DIAGNOSIS — L89310 Pressure ulcer of right buttock, unstageable: Secondary | ICD-10-CM | POA: Diagnosis not present

## 2021-10-21 DIAGNOSIS — M6281 Muscle weakness (generalized): Secondary | ICD-10-CM | POA: Diagnosis not present

## 2021-10-21 DIAGNOSIS — I5022 Chronic systolic (congestive) heart failure: Secondary | ICD-10-CM | POA: Diagnosis not present

## 2021-10-24 DIAGNOSIS — I5022 Chronic systolic (congestive) heart failure: Secondary | ICD-10-CM | POA: Diagnosis not present

## 2021-10-24 DIAGNOSIS — L89310 Pressure ulcer of right buttock, unstageable: Secondary | ICD-10-CM | POA: Diagnosis not present

## 2021-10-24 DIAGNOSIS — M6259 Muscle wasting and atrophy, not elsewhere classified, multiple sites: Secondary | ICD-10-CM | POA: Diagnosis not present

## 2021-10-24 DIAGNOSIS — R279 Unspecified lack of coordination: Secondary | ICD-10-CM | POA: Diagnosis not present

## 2021-10-24 DIAGNOSIS — R2681 Unsteadiness on feet: Secondary | ICD-10-CM | POA: Diagnosis not present

## 2021-10-24 DIAGNOSIS — M6281 Muscle weakness (generalized): Secondary | ICD-10-CM | POA: Diagnosis not present

## 2021-10-24 DIAGNOSIS — A419 Sepsis, unspecified organism: Secondary | ICD-10-CM | POA: Diagnosis not present

## 2021-10-24 DIAGNOSIS — M4628 Osteomyelitis of vertebra, sacral and sacrococcygeal region: Secondary | ICD-10-CM | POA: Diagnosis not present

## 2021-10-24 DIAGNOSIS — R293 Abnormal posture: Secondary | ICD-10-CM | POA: Diagnosis not present

## 2021-10-24 DIAGNOSIS — M8618 Other acute osteomyelitis, other site: Secondary | ICD-10-CM | POA: Diagnosis not present

## 2021-10-25 DIAGNOSIS — L89310 Pressure ulcer of right buttock, unstageable: Secondary | ICD-10-CM | POA: Diagnosis not present

## 2021-10-25 DIAGNOSIS — R2681 Unsteadiness on feet: Secondary | ICD-10-CM | POA: Diagnosis not present

## 2021-10-25 DIAGNOSIS — I5022 Chronic systolic (congestive) heart failure: Secondary | ICD-10-CM | POA: Diagnosis not present

## 2021-10-25 DIAGNOSIS — M6259 Muscle wasting and atrophy, not elsewhere classified, multiple sites: Secondary | ICD-10-CM | POA: Diagnosis not present

## 2021-10-25 DIAGNOSIS — R293 Abnormal posture: Secondary | ICD-10-CM | POA: Diagnosis not present

## 2021-10-25 DIAGNOSIS — M6281 Muscle weakness (generalized): Secondary | ICD-10-CM | POA: Diagnosis not present

## 2021-10-26 DIAGNOSIS — R2681 Unsteadiness on feet: Secondary | ICD-10-CM | POA: Diagnosis not present

## 2021-10-26 DIAGNOSIS — M6281 Muscle weakness (generalized): Secondary | ICD-10-CM | POA: Diagnosis not present

## 2021-10-26 DIAGNOSIS — M6259 Muscle wasting and atrophy, not elsewhere classified, multiple sites: Secondary | ICD-10-CM | POA: Diagnosis not present

## 2021-10-26 DIAGNOSIS — I5022 Chronic systolic (congestive) heart failure: Secondary | ICD-10-CM | POA: Diagnosis not present

## 2021-10-26 DIAGNOSIS — L89310 Pressure ulcer of right buttock, unstageable: Secondary | ICD-10-CM | POA: Diagnosis not present

## 2021-10-26 DIAGNOSIS — R293 Abnormal posture: Secondary | ICD-10-CM | POA: Diagnosis not present

## 2021-10-27 DIAGNOSIS — M6281 Muscle weakness (generalized): Secondary | ICD-10-CM | POA: Diagnosis not present

## 2021-10-27 DIAGNOSIS — L89310 Pressure ulcer of right buttock, unstageable: Secondary | ICD-10-CM | POA: Diagnosis not present

## 2021-10-27 DIAGNOSIS — R293 Abnormal posture: Secondary | ICD-10-CM | POA: Diagnosis not present

## 2021-10-27 DIAGNOSIS — I5022 Chronic systolic (congestive) heart failure: Secondary | ICD-10-CM | POA: Diagnosis not present

## 2021-10-27 DIAGNOSIS — R2681 Unsteadiness on feet: Secondary | ICD-10-CM | POA: Diagnosis not present

## 2021-10-27 DIAGNOSIS — M6259 Muscle wasting and atrophy, not elsewhere classified, multiple sites: Secondary | ICD-10-CM | POA: Diagnosis not present

## 2021-10-28 DIAGNOSIS — R2681 Unsteadiness on feet: Secondary | ICD-10-CM | POA: Diagnosis not present

## 2021-10-28 DIAGNOSIS — L89310 Pressure ulcer of right buttock, unstageable: Secondary | ICD-10-CM | POA: Diagnosis not present

## 2021-10-28 DIAGNOSIS — I5022 Chronic systolic (congestive) heart failure: Secondary | ICD-10-CM | POA: Diagnosis not present

## 2021-10-28 DIAGNOSIS — M6281 Muscle weakness (generalized): Secondary | ICD-10-CM | POA: Diagnosis not present

## 2021-10-28 DIAGNOSIS — M6259 Muscle wasting and atrophy, not elsewhere classified, multiple sites: Secondary | ICD-10-CM | POA: Diagnosis not present

## 2021-10-28 DIAGNOSIS — R293 Abnormal posture: Secondary | ICD-10-CM | POA: Diagnosis not present

## 2021-10-31 DIAGNOSIS — R2681 Unsteadiness on feet: Secondary | ICD-10-CM | POA: Diagnosis not present

## 2021-10-31 DIAGNOSIS — I5022 Chronic systolic (congestive) heart failure: Secondary | ICD-10-CM | POA: Diagnosis not present

## 2021-10-31 DIAGNOSIS — M6259 Muscle wasting and atrophy, not elsewhere classified, multiple sites: Secondary | ICD-10-CM | POA: Diagnosis not present

## 2021-10-31 DIAGNOSIS — M6281 Muscle weakness (generalized): Secondary | ICD-10-CM | POA: Diagnosis not present

## 2021-10-31 DIAGNOSIS — R293 Abnormal posture: Secondary | ICD-10-CM | POA: Diagnosis not present

## 2021-10-31 DIAGNOSIS — L89310 Pressure ulcer of right buttock, unstageable: Secondary | ICD-10-CM | POA: Diagnosis not present

## 2021-11-01 ENCOUNTER — Other Ambulatory Visit: Payer: Self-pay

## 2021-11-01 ENCOUNTER — Encounter: Payer: Self-pay | Admitting: *Deleted

## 2021-11-01 ENCOUNTER — Encounter: Payer: Self-pay | Admitting: Cardiology

## 2021-11-01 ENCOUNTER — Ambulatory Visit (INDEPENDENT_AMBULATORY_CARE_PROVIDER_SITE_OTHER): Payer: Medicare Other | Admitting: Cardiology

## 2021-11-01 VITALS — BP 106/70 | HR 90 | Ht 72.0 in

## 2021-11-01 DIAGNOSIS — R293 Abnormal posture: Secondary | ICD-10-CM | POA: Diagnosis not present

## 2021-11-01 DIAGNOSIS — D6869 Other thrombophilia: Secondary | ICD-10-CM | POA: Diagnosis not present

## 2021-11-01 DIAGNOSIS — L89310 Pressure ulcer of right buttock, unstageable: Secondary | ICD-10-CM | POA: Diagnosis not present

## 2021-11-01 DIAGNOSIS — M6281 Muscle weakness (generalized): Secondary | ICD-10-CM | POA: Diagnosis not present

## 2021-11-01 DIAGNOSIS — I4819 Other persistent atrial fibrillation: Secondary | ICD-10-CM

## 2021-11-01 DIAGNOSIS — R2681 Unsteadiness on feet: Secondary | ICD-10-CM | POA: Diagnosis not present

## 2021-11-01 DIAGNOSIS — M6259 Muscle wasting and atrophy, not elsewhere classified, multiple sites: Secondary | ICD-10-CM | POA: Diagnosis not present

## 2021-11-01 DIAGNOSIS — I5022 Chronic systolic (congestive) heart failure: Secondary | ICD-10-CM | POA: Diagnosis not present

## 2021-11-01 NOTE — Patient Instructions (Signed)
Medication Instructions:  Your physician recommends that you continue on your current medications as directed. Please refer to the Current Medication list given to you today.  *If you need a refill on your cardiac medications before your next appointment, please call your pharmacy*   Lab Work: None ordered If you have labs (blood work) drawn today and your tests are completely normal, you will receive your results only by: [place] (if you have MyChart) OR A paper copy in the mail If you have any lab test that is abnormal or we need to change your treatment, we will call you to review the results.   Testing/Procedures: Your physician has recommended that you have a Cardioversion (DCCV). Electrical Cardioversion uses a jolt of electricity to your heart either through paddles or wired patches attached to your chest. This is a controlled, usually prescheduled, procedure. Defibrillation is done under light anesthesia in the hospital, and you usually go home the day of the procedure. This is done to get your heart back into a normal rhythm. You are not awake for the procedure. Please see the instruction sheet given to you today.    Follow-Up: At [facility], you and your health needs are our priority.  As part of our continuing mission to provide you with exceptional heart care, we have created designated Provider Care Teams.  These Care Teams include your primary Cardiologist (physician) and Advanced Practice Providers (APPs -  Physician Assistants and Nurse Practitioners) who all work together to provide you with the care you need, when you need it.  Your next appointment:   01/27/2022  @ 9:00 am  The format for your next appointment:   In Person  Provider:   Legrand Como "Jonni Sanger" Chalmers Cater, [place]    Thank you for choosing [facility]!!   Trinidad Curet, RN [phone]   Other Instructions  CARDIOVERSION INSTRUCTIONS  You are scheduled for a Cardioversion on 11/18/2021 with  Dr. Debara Pickett.  Please arrive at the [facility] (Main Entrance A) at [facility]: [address] [place], [place] [address] at 9:30 am  DIET: Nothing to eat or drink after midnight except a sip of water with medications (see medication instructions below)  FYI: For your safety, and to allow [place] to monitor your vital signs accurately during the surgery/procedure we request that   if you have artificial nails, gel coating, SNS etc. Please have those removed prior to your surgery/procedure. Not having the nail coverings /polish removed may result in cancellation or delay of your surgery/procedure.   Medication Instructions: Hold all medications the morning of this procedure, EXCEPT take your Eliquis  Continue your anticoagulant: Eliquis You will need to continue your anticoagulant after your procedure until you  are told by your provider that it is safe to stop   Labs:  your lab work will be done at the hospital prior to your procedure   You must have a responsible person to drive you home and stay in the waiting area during your procedure. Failure to do so could result in cancellation.  Bring your insurance cards.  *Special Note: Every effort is made to have your procedure done on time. Occasionally there are emergencies that occur at the hospital that may cause delays. Please be patient if a delay does occur.

## 2021-11-01 NOTE — Progress Notes (Signed)
Electrophysiology Office Note   Date:  11/01/2021   ID:  Jeffery Christian, Jeffery Christian Aug 04, 1951, MRN [id number]  PCP:  Hendricks Limes, MD  Cardiologist:   Primary Electrophysiologist: Reice Bienvenue Meredith Leeds, MD    Chief Complaint: AF   History of Present Illness: Jeffery Christian is a 71 y.o. male who is being seen today for the evaluation of AF at the request of Hendricks Limes, MD. Presenting today for electrophysiology evaluation.  He has a history significant for CKD stage III and persistent atrial fibrillation.  He was hospitalized August 2022 after being found down by his friends.  Hospital course was complicated by [id number] infection, acute on chronic kidney disease due to hypovolemia, metabolic encephalopathy.  He has had multiple hospitalizations and emergency room visits for various issues including UTIs and sepsis.  He has been working with physical therapy at his nursing facility.  He was feeling short of breath and fatigued and was thus started on amiodarone.  Today, denies symptoms of palpitations, chest pain, shortness of breath, orthopnea, PND, lower extremity edema, claudication, dizziness, presyncope, syncope, bleeding, or neurologic sequela. The patient is tolerating medications without difficulties.  He currently feels well.  He has no chest pain or shortness of breath.  He is continuing to undergo rehab.  He is able to ride a stationary bike.  He is sitting on the side of the bed.  His therapist feel that he Brody Kump be up on his feet soon.  They have not given him a time yet that he Gilmore List return home.   Past Medical History:  Diagnosis Date   Chronic kidney disease    Chronic systolic CHF (congestive heart failure) (HCC)    EF 35-40, diffuse HK, mild MR, moderate LAE, mild RAE, PASP 44, L pleural eff   Dilated cardiomyopathy ([place])    likely related to tachycardia   Dysrhythmia    Persistent atrial fibrillation [facility])    Past Surgical History:  Procedure Laterality Date    CARDIOVERSION N/A 08/20/2017   Procedure: CARDIOVERSION;  Surgeon: Jerline Pain, MD;  Location: [place];  Service: Cardiovascular;  Laterality: N/A;   INCISION AND DRAINAGE ABSCESS Left 03/22/2014   Procedure: INCISION AND DRAINAGE ABSCESS LEFT BUTTOCK ABSCESS;  Surgeon: Zenovia Jarred, MD;  Location: [place];  Service: General;  Laterality: Left;     Current Outpatient Medications  Medication Sig Dispense Refill   allopurinol (ZYLOPRIM) 300 MG tablet Take 150 mg by mouth daily.     Amino Acids-Protein Hydrolys (PRO-STAT PO) Take 30 mLs by mouth 2 (two) times daily.     amiodarone (PACERONE) 200 MG tablet Take 200 mg by mouth daily.     apixaban (ELIQUIS) 5 MG TABS tablet Take 1 tablet (5 mg total) by mouth 2 (two) times daily.     atorvastatin (LIPITOR) 20 MG tablet Take 1 tablet (20 mg total) by mouth every evening.     bisacodyl (DULCOLAX) 10 MG suppository as needed. If not relieved by MOM, give 10 mg Bisacodyl suppositiory rectally X 1 dose in 24 hours as needed     Cholecalciferol (VITAMIN D) 50 MCG (2000 UT) tablet Take 2,000 Units by mouth daily.     colchicine 0.6 MG tablet Take 0.12 mg by mouth as directed. At the first sign of a gout flare up and then take one tablet in one hour if symptoms continue     diltiazem (CARDIZEM) 90 MG tablet Take 90 mg by mouth every 8 (eight) hours. Hold  for SBP < 100 and Heart rate < 60     divalproex (DEPAKOTE SPRINKLE) 125 MG capsule Take 125 mg by mouth 2 (two) times daily.     Ensure (ENSURE) Take 237 mLs by mouth daily at 12 noon.     Magnesium Hydroxide (MILK OF MAGNESIA PO) If no BM in 3 days, give 30 cc Milk of Magnesium p.o. x 1 dose in 24 hours as needed (Do not use standing constipation orders for residents with renal failure CFR less than 30. Contact MD for orders)     Menthol, Topical Analgesic, (BIOFREEZE) 4 % GEL Apply topically. APPLY TOPICALLY TO LEFT SHOULDER THREE TIMES DAILY FOR PAIN     midodrine (PROAMATINE) 10 MG tablet Take  1 tablet (10 mg total) by mouth 3 (three) times daily with meals.     NON FORMULARY MAGIC CUP: ONE MAGIC CUP DAILY FOR WOUND HEALING     NON FORMULARY Diet: Regular/thin diet consistency     pantoprazole (PROTONIX) 40 MG tablet Take 1 tablet (40 mg total) by mouth daily.     polyethylene glycol (MIRALAX / GLYCOLAX) 17 g packet Take 17 g by mouth daily.     Sodium Phosphates (RA SALINE ENEMA RE) If not relieved by MOM, give 10 mg Bisacodyl suppositiory rectally X 1 dose in 24 hours as needed     tamsulosin (FLOMAX) 0.4 MG CAPS capsule Take 1 capsule (0.4 mg total) by mouth daily after supper. 30 capsule    traMADol (ULTRAM) 50 MG tablet Take 0.5 tablets (25 mg total) by mouth 2 (two) times daily. 30 tablet 0   vitamin C (ASCORBIC ACID) 500 MG tablet Take 500 mg by mouth 2 (two) times daily.     amiodarone (PACERONE) 400 MG tablet Take 400 mg by mouth daily. 400 mg TWICE A DAY FOR TWO WEEKS (05/18/2021-05/31/2021) 400 mg daily for 2 weeks (05/31/2021-06/14/2021 200 mg daily 09/14 (Patient not taking: Reported on 11/01/2021)     No current facility-administered medications for this visit.    Allergies:   Patient has no known allergies.   Social History:  The patient  reports that he has never smoked. He has never used smokeless tobacco. He reports that he does not currently use alcohol. He reports that he does not use drugs.   Family History:  The patient's family history includes Cancer in his mother; Leukemia in his father.   ROS:  Please see the history of present illness.   Otherwise, review of systems is positive for none.   All other systems are reviewed and negative.   PHYSICAL EXAM: VS:  BP 106/70    Pulse 90    Ht 6' (1.829 m)    SpO2 90%    BMI 27.07 kg/m  , BMI Body mass index is 27.07 kg/m. GEN: Well nourished, well developed, in no acute distress  HEENT: normal  Neck: no JVD, carotid bruits, or masses Cardiac: irregular; no murmurs, rubs, or gallops,no edema  Respiratory:   clear to auscultation bilaterally, normal work of breathing GI: soft, nontender, nondistended, + BS MS: no deformity or atrophy  Skin: warm and dry Neuro:  Strength and sensation are intact Psych: euthymic mood, full affect  EKG:  EKG is ordered today. Personal review of the ekg ordered shows atrial fibrillation, rate 90  Recent Labs: 03/16/2021: B Natriuretic Peptide 86.5 03/20/2021: ALT 8; Magnesium 2.1 03/21/2021: TSH 5.052 08/15/2021: BUN 29; Creatinine 0.9; Hemoglobin 13.6; Platelets 400; Potassium 4.5; Sodium 142  Lipid Panel     Component Value Date/Time   CHOL 95 03/07/2021 0000   CHOL 139 10/28/2018 1014   TRIG 137 03/07/2021 0000   HDL 24 (A) 03/07/2021 0000   HDL 39 (L) 10/28/2018 1014   CHOLHDL 3.6 10/28/2018 1014   LDLCALC 44 03/07/2021 0000   LDLCALC 65 10/28/2018 1014     Wt Readings from Last 3 Encounters:  10/11/21 199 lb 9.6 oz (90.5 kg)  10/04/21 195 lb 9.6 oz (88.7 kg)  08/29/21 195 lb 9.6 oz (88.7 kg)      Other studies Reviewed: Additional studies/ records that were reviewed today include: TTE 11/10/20  Review of the above records today demonstrates:   1. Left ventricular ejection fraction, by estimation, is 60 to 65%. The  left ventricle has normal function. The left ventricle has no regional  wall motion abnormalities. There is moderate left ventricular hypertrophy.  Left ventricular diastolic  parameters are indeterminate.   2. Right ventricular systolic function is normal. The right ventricular  size is normal.   3. Left atrial size was mildly dilated.   4. The mitral valve is normal in structure. Trivial mitral valve  regurgitation. No evidence of mitral stenosis.   5. The aortic valve was not well visualized. Aortic valve regurgitation  is not visualized. No aortic stenosis is present.   6. The inferior vena cava is dilated in size with >50% respiratory  variability, suggesting right atrial pressure of 8 mmHg.    ASSESSMENT AND  PLAN:  1.  Persistent atrial fibrillation: Currently on Eliquis 5 mg twice daily, amiodarone 200 mg daily.  CHA2DS2-VASc of 2.  High risk medication monitoring for amiodarone.  He is unfortunately still in atrial fibrillation.  It is unclear to me as to whether or not he is having symptoms from his atrial fibrillation as he is not very active, continuing to go through rehab.  To give him the best chance, we Baleigh Rennaker plan for cardioversion.  If he continues to feel well after cardioversion, we Georgie Eduardo continue with a rhythm control strategy.  2.  Secondary hypercoagulable state: Continue Eliquis for atrial fibrillation as above  Current medicines are reviewed at length with the patient today.   The patient does not have concerns regarding his medicines.  The following changes were made today: None  Labs/ tests ordered today include:  Orders Placed This Encounter  Procedures   EKG 12-Lead     Disposition:   FU with Yi Falletta 3 months  Signed, Jalacia Mattila Meredith Leeds, MD  11/01/2021 9:32 AM     [place] [address] [place] [address] [phone] (office) [phone] (fax)

## 2021-11-01 NOTE — H&P (View-Only) (Signed)
Electrophysiology Office Note   Date:  11/01/2021   ID:  Marqel, Cozad [birth year], MRN [id number]  PCP:  Hendricks Limes, MD  Cardiologist:   Primary Electrophysiologist: Olumide Dolinger Meredith Leeds, MD    Chief Complaint: AF   History of Present Illness: Jeffery Christian is a 71 y.o. male who is being seen today for the evaluation of AF at the request of Hendricks Limes, MD. Presenting today for electrophysiology evaluation.  He has a history significant for CKD stage III and persistent atrial fibrillation.  He was hospitalized August 2022 after being found down by his friends.  Hospital course was complicated by [id number] infection, acute on chronic kidney disease due to hypovolemia, metabolic encephalopathy.  He has had multiple hospitalizations and emergency room visits for various issues including UTIs and sepsis.  He has been working with physical therapy at his nursing facility.  He was feeling short of breath and fatigued and was thus started on amiodarone.  Today, denies symptoms of palpitations, chest pain, shortness of breath, orthopnea, PND, lower extremity edema, claudication, dizziness, presyncope, syncope, bleeding, or neurologic sequela. The patient is tolerating medications without difficulties.  He currently feels well.  He has no chest pain or shortness of breath.  He is continuing to undergo rehab.  He is able to ride a stationary bike.  He is sitting on the side of the bed.  His therapist feel that he Jaiana Sheffer be up on his feet soon.  They have not given him a time yet that he Elma Limas return home.   Past Medical History:  Diagnosis Date   Chronic kidney disease    Chronic systolic CHF (congestive heart failure) (HCC)    EF 35-40, diffuse HK, mild MR, moderate LAE, mild RAE, PASP 44, L pleural eff   Dilated cardiomyopathy ([place])    likely related to tachycardia   Dysrhythmia    Persistent atrial fibrillation [facility])    Past Surgical History:  Procedure Laterality Date    CARDIOVERSION N/A 08/20/2017   Procedure: CARDIOVERSION;  Surgeon: Jerline Pain, MD;  Location: [place];  Service: Cardiovascular;  Laterality: N/A;   INCISION AND DRAINAGE ABSCESS Left 03/22/2014   Procedure: INCISION AND DRAINAGE ABSCESS LEFT BUTTOCK ABSCESS;  Surgeon: Zenovia Jarred, MD;  Location: [place];  Service: General;  Laterality: Left;     Current Outpatient Medications  Medication Sig Dispense Refill   allopurinol (ZYLOPRIM) 300 MG tablet Take 150 mg by mouth daily.     Amino Acids-Protein Hydrolys (PRO-STAT PO) Take 30 mLs by mouth 2 (two) times daily.     amiodarone (PACERONE) 200 MG tablet Take 200 mg by mouth daily.     apixaban (ELIQUIS) 5 MG TABS tablet Take 1 tablet (5 mg total) by mouth 2 (two) times daily.     atorvastatin (LIPITOR) 20 MG tablet Take 1 tablet (20 mg total) by mouth every evening.     bisacodyl (DULCOLAX) 10 MG suppository as needed. If not relieved by MOM, give 10 mg Bisacodyl suppositiory rectally X 1 dose in 24 hours as needed     Cholecalciferol (VITAMIN D) 50 MCG (2000 UT) tablet Take 2,000 Units by mouth daily.     colchicine 0.6 MG tablet Take 0.12 mg by mouth as directed. At the first sign of a gout flare up and then take one tablet in one hour if symptoms continue     diltiazem (CARDIZEM) 90 MG tablet Take 90 mg by mouth every 8 (eight) hours. Hold  for SBP < 100 and Heart rate < 60     divalproex (DEPAKOTE SPRINKLE) 125 MG capsule Take 125 mg by mouth 2 (two) times daily.     Ensure (ENSURE) Take 237 mLs by mouth daily at 12 noon.     Magnesium Hydroxide (MILK OF MAGNESIA PO) If no BM in 3 days, give 30 cc Milk of Magnesium p.o. x 1 dose in 24 hours as needed (Do not use standing constipation orders for residents with renal failure CFR less than 30. Contact MD for orders)     Menthol, Topical Analgesic, (BIOFREEZE) 4 % GEL Apply topically. APPLY TOPICALLY TO LEFT SHOULDER THREE TIMES DAILY FOR PAIN     midodrine (PROAMATINE) 10 MG tablet Take  1 tablet (10 mg total) by mouth 3 (three) times daily with meals.     NON FORMULARY MAGIC CUP: ONE MAGIC CUP DAILY FOR WOUND HEALING     NON FORMULARY Diet: Regular/thin diet consistency     pantoprazole (PROTONIX) 40 MG tablet Take 1 tablet (40 mg total) by mouth daily.     polyethylene glycol (MIRALAX / GLYCOLAX) 17 g packet Take 17 g by mouth daily.     Sodium Phosphates (RA SALINE ENEMA RE) If not relieved by MOM, give 10 mg Bisacodyl suppositiory rectally X 1 dose in 24 hours as needed     tamsulosin (FLOMAX) 0.4 MG CAPS capsule Take 1 capsule (0.4 mg total) by mouth daily after supper. 30 capsule    traMADol (ULTRAM) 50 MG tablet Take 0.5 tablets (25 mg total) by mouth 2 (two) times daily. 30 tablet 0   vitamin C (ASCORBIC ACID) 500 MG tablet Take 500 mg by mouth 2 (two) times daily.     amiodarone (PACERONE) 400 MG tablet Take 400 mg by mouth daily. 400 mg TWICE A DAY FOR TWO WEEKS (05/18/2021-05/31/2021) 400 mg daily for 2 weeks (05/31/2021-06/14/2021 200 mg daily 09/14 (Patient not taking: Reported on 11/01/2021)     No current facility-administered medications for this visit.    Allergies:   Patient has no known allergies.   Social History:  The patient  reports that he has never smoked. He has never used smokeless tobacco. He reports that he does not currently use alcohol. He reports that he does not use drugs.   Family History:  The patient's family history includes Cancer in his mother; Leukemia in his father.   ROS:  Please see the history of present illness.   Otherwise, review of systems is positive for none.   All other systems are reviewed and negative.   PHYSICAL EXAM: VS:  BP 106/70    Pulse 90    Ht 6' (1.829 m)    SpO2 90%    BMI 27.07 kg/m  , BMI Body mass index is 27.07 kg/m. GEN: Well nourished, well developed, in no acute distress  HEENT: normal  Neck: no JVD, carotid bruits, or masses Cardiac: irregular; no murmurs, rubs, or gallops,no edema  Respiratory:   clear to auscultation bilaterally, normal work of breathing GI: soft, nontender, nondistended, + BS MS: no deformity or atrophy  Skin: warm and dry Neuro:  Strength and sensation are intact Psych: euthymic mood, full affect  EKG:  EKG is ordered today. Personal review of the ekg ordered shows atrial fibrillation, rate 90  Recent Labs: 03/16/2021: B Natriuretic Peptide 86.5 03/20/2021: ALT 8; Magnesium 2.1 03/21/2021: TSH 5.052 08/15/2021: BUN 29; Creatinine 0.9; Hemoglobin 13.6; Platelets 400; Potassium 4.5; Sodium 142  Lipid Panel     Component Value Date/Time   CHOL 95 03/07/2021 0000   CHOL 139 10/28/2018 1014   TRIG 137 03/07/2021 0000   HDL 24 (A) 03/07/2021 0000   HDL 39 (L) 10/28/2018 1014   CHOLHDL 3.6 10/28/2018 1014   LDLCALC 44 03/07/2021 0000   LDLCALC 65 10/28/2018 1014     Wt Readings from Last 3 Encounters:  10/11/21 199 lb 9.6 oz (90.5 kg)  10/04/21 195 lb 9.6 oz (88.7 kg)  08/29/21 195 lb 9.6 oz (88.7 kg)      Other studies Reviewed: Additional studies/ records that were reviewed today include: TTE 11/10/20  Review of the above records today demonstrates:   1. Left ventricular ejection fraction, by estimation, is 60 to 65%. The  left ventricle has normal function. The left ventricle has no regional  wall motion abnormalities. There is moderate left ventricular hypertrophy.  Left ventricular diastolic  parameters are indeterminate.   2. Right ventricular systolic function is normal. The right ventricular  size is normal.   3. Left atrial size was mildly dilated.   4. The mitral valve is normal in structure. Trivial mitral valve  regurgitation. No evidence of mitral stenosis.   5. The aortic valve was not well visualized. Aortic valve regurgitation  is not visualized. No aortic stenosis is present.   6. The inferior vena cava is dilated in size with >50% respiratory  variability, suggesting right atrial pressure of 8 mmHg.    ASSESSMENT AND  PLAN:  1.  Persistent atrial fibrillation: Currently on Eliquis 5 mg twice daily, amiodarone 200 mg daily.  CHA2DS2-VASc of 2.  High risk medication monitoring for amiodarone.  He is unfortunately still in atrial fibrillation.  It is unclear to me as to whether or not he is having symptoms from his atrial fibrillation as he is not very active, continuing to go through rehab.  To give him the best chance, we Sharlotte Baka plan for cardioversion.  If he continues to feel well after cardioversion, we Master Touchet continue with a rhythm control strategy.  2.  Secondary hypercoagulable state: Continue Eliquis for atrial fibrillation as above  Current medicines are reviewed at length with the patient today.   The patient does not have concerns regarding his medicines.  The following changes were made today: None  Labs/ tests ordered today include:  Orders Placed This Encounter  Procedures   EKG 12-Lead     Disposition:   FU with Denae Zulueta 3 months  Signed, Rmani Kapusta Meredith Leeds, MD  11/01/2021 9:32 AM     [address] [place] [address] [phone] (office) [phone] (fax)

## 2021-11-02 ENCOUNTER — Encounter: Payer: Self-pay | Admitting: Adult Health

## 2021-11-02 ENCOUNTER — Non-Acute Institutional Stay (SKILLED_NURSING_FACILITY): Payer: Medicare Other | Admitting: Adult Health

## 2021-11-02 DIAGNOSIS — M6259 Muscle wasting and atrophy, not elsewhere classified, multiple sites: Secondary | ICD-10-CM | POA: Diagnosis not present

## 2021-11-02 DIAGNOSIS — Z7189 Other specified counseling: Secondary | ICD-10-CM | POA: Diagnosis not present

## 2021-11-02 DIAGNOSIS — F0150 Vascular dementia without behavioral disturbance: Secondary | ICD-10-CM | POA: Diagnosis not present

## 2021-11-02 DIAGNOSIS — I4819 Other persistent atrial fibrillation: Secondary | ICD-10-CM | POA: Diagnosis not present

## 2021-11-02 DIAGNOSIS — R293 Abnormal posture: Secondary | ICD-10-CM | POA: Diagnosis not present

## 2021-11-02 DIAGNOSIS — I5022 Chronic systolic (congestive) heart failure: Secondary | ICD-10-CM | POA: Diagnosis not present

## 2021-11-02 DIAGNOSIS — R569 Unspecified convulsions: Secondary | ICD-10-CM

## 2021-11-02 DIAGNOSIS — N179 Acute kidney failure, unspecified: Secondary | ICD-10-CM | POA: Diagnosis not present

## 2021-11-02 DIAGNOSIS — M6281 Muscle weakness (generalized): Secondary | ICD-10-CM | POA: Diagnosis not present

## 2021-11-02 DIAGNOSIS — R2681 Unsteadiness on feet: Secondary | ICD-10-CM | POA: Diagnosis not present

## 2021-11-02 DIAGNOSIS — M1A09X0 Idiopathic chronic gout, multiple sites, without tophus (tophi): Secondary | ICD-10-CM

## 2021-11-02 DIAGNOSIS — I959 Hypotension, unspecified: Secondary | ICD-10-CM

## 2021-11-02 DIAGNOSIS — L89310 Pressure ulcer of right buttock, unstageable: Secondary | ICD-10-CM | POA: Diagnosis not present

## 2021-11-02 DIAGNOSIS — Z741 Need for assistance with personal care: Secondary | ICD-10-CM | POA: Diagnosis not present

## 2021-11-02 DIAGNOSIS — R279 Unspecified lack of coordination: Secondary | ICD-10-CM | POA: Diagnosis not present

## 2021-11-02 NOTE — Progress Notes (Signed)
Location:  [facility] Room Number: 106 Place of Service:  SNF (31) Provider:  Kenard Gower, DNP, FNP-BC  Patient Care Team: Pecola Lawless, MD as PCP - General (Internal Medicine) Regan Lemming, MD as PCP - Electrophysiology (Cardiology) Medina-Vargas, Margit Banda, NP as Nurse Practitioner (Internal Medicine)  Extended Emergency Contact Information Primary Emergency Contact: Glendora Score [place] of [place] Home Phone: [phone] Relation: Daughter Secondary Emergency Contact: SAPP,RON Home Phone: [phone] Relation: Other  Code Status:  DNR  Goals of care: Advanced Directive information Advanced Directives 11/02/2021  Does Patient Have a Medical Advance Directive? Yes  Type of Advance Directive Out of facility DNR (pink MOST or yellow form)  Does patient want to make changes to medical advance directive? No - Patient declined  Would patient like information on creating a medical advance directive? -  Pre-existing out of facility DNR order (yellow form or pink MOST form) Yellow form placed in chart (order not valid for inpatient use)     Chief Complaint  Patient presents with   Acute Visit    Care plan meeting    HPI:  Pt is a 71 y.o. male seen today for care plan meeting. He is a long-term care resident of [facility]. The meeting was attended by dietary, NP, lives in [place] and social worker x2.  Resident was invited to the meeting but declined. Daughter, [place], was invited to the meeting but was not successful in contacting her via teleconference.  He remains to be DNR.  Discussed medications, vital signs.  Staff reported that he has occasional refusal of showers.  He does not participate in facility activities.  He talks with roommate only.  He is currently on therapy caseload, PT and OT. SBPs ranging from 112-139, with outliers 99.  He takes midodrine 10 mg 1 tab 3 times a day for hypotension.   Divalproex DR was recently increased from 125 mg twice daily to 3 times a day for an episode of seizure.  The meeting lasted for 25 minutes.   Past Medical History:  Diagnosis Date   Chronic kidney disease    Chronic systolic CHF (congestive heart failure) (HCC)    EF 35-40, diffuse HK, mild MR, moderate LAE, mild RAE, PASP 44, L pleural eff   Dilated cardiomyopathy (HCC)    likely related to tachycardia   Dysrhythmia    Persistent atrial fibrillation [facility])    Past Surgical History:  Procedure Laterality Date   CARDIOVERSION N/A 08/20/2017   Procedure: CARDIOVERSION;  Surgeon: [name], MD;  Location: [facility];  Service: Cardiovascular;  Laterality: N/A;   INCISION AND DRAINAGE ABSCESS Left 03/22/2014   Procedure: INCISION AND DRAINAGE ABSCESS LEFT BUTTOCK ABSCESS;  Surgeon: [name], MD;  Location: [facility] OR;  Service: General;  Laterality: Left;    No Known Allergies  Outpatient Encounter Medications as of 11/02/2021  Medication Sig   allopurinol (ZYLOPRIM) 300 MG tablet Take 150 mg by mouth daily.   amiodarone (PACERONE) 200 MG tablet Take 200 mg by mouth daily.   apixaban (ELIQUIS) 5 MG TABS tablet Take 1 tablet (5 mg total) by mouth 2 (two) times daily.   atorvastatin (LIPITOR) 20 MG tablet Take 1 tablet (20 mg total) by mouth every evening.   Cholecalciferol (VITAMIN D) 50 MCG (2000 UT) tablet Take 2,000 Units by mouth daily.   colchicine 0.6 MG tablet Take 0.12 mg by mouth as directed. At the first sign of a  gout flare up and then take one tablet in one hour if symptoms continue   diltiazem (CARDIZEM) 90 MG tablet Take 90 mg by mouth every 8 (eight) hours. Hold for SBP < 100 and Heart rate < 60   divalproex (DEPAKOTE SPRINKLE) 125 MG capsule Take 125 mg by mouth 2 (two) times daily.   Ensure (ENSURE) Take 237 mLs by mouth daily at 12 noon.   Menthol, Topical Analgesic, (BIOFREEZE) 4 % GEL Apply topically. APPLY TOPICALLY TO LEFT SHOULDER THREE TIMES DAILY FOR PAIN    midodrine (PROAMATINE) 10 MG tablet Take 1 tablet (10 mg total) by mouth 3 (three) times daily with meals.   NON FORMULARY MAGIC CUP: ONE MAGIC CUP DAILY FOR WOUND HEALING   NON FORMULARY Diet: Regular/thin diet consistency   pantoprazole (PROTONIX) 40 MG tablet Take 1 tablet (40 mg total) by mouth daily.   polyethylene glycol (MIRALAX / GLYCOLAX) 17 g packet Take 17 g by mouth daily.   tamsulosin (FLOMAX) 0.4 MG CAPS capsule Take 1 capsule (0.4 mg total) by mouth daily after supper.   traMADol (ULTRAM) 50 MG tablet Take 0.5 tablets (25 mg total) by mouth 2 (two) times daily.   vitamin C (ASCORBIC ACID) 500 MG tablet Take 500 mg by mouth 2 (two) times daily.   [DISCONTINUED] Amino Acids-Protein Hydrolys (PRO-STAT PO) Take 30 mLs by mouth 2 (two) times daily.   [DISCONTINUED] amiodarone (PACERONE) 400 MG tablet Take 400 mg by mouth daily. 400 mg TWICE A DAY FOR TWO WEEKS (05/18/2021-05/31/2021) 400 mg daily for 2 weeks (05/31/2021-06/14/2021 200 mg daily 09/14 (Patient not taking: Reported on 11/01/2021)   [DISCONTINUED] bisacodyl (DULCOLAX) 10 MG suppository as needed. If not relieved by MOM, give 10 mg Bisacodyl suppositiory rectally X 1 dose in 24 hours as needed   [DISCONTINUED] Magnesium Hydroxide (MILK OF MAGNESIA PO) If no BM in 3 days, give 30 cc Milk of Magnesium p.o. x 1 dose in 24 hours as needed (Do not use standing constipation orders for residents with renal failure CFR less than 30. Contact MD for orders)   [DISCONTINUED] Sodium Phosphates (RA SALINE ENEMA RE) If not relieved by MOM, give 10 mg Bisacodyl suppositiory rectally X 1 dose in 24 hours as needed   No facility-administered encounter medications on file as of 11/02/2021.    Review of Systems  Constitutional:  Negative for activity change, appetite change and fever.  HENT:  Negative for sore throat.   Eyes: Negative.   Cardiovascular:  Negative for chest pain and leg swelling.  Gastrointestinal:  Negative for abdominal  distention, diarrhea and vomiting.  Genitourinary:  Negative for dysuria, frequency and urgency.  Skin:  Negative for color change.  Neurological:  Negative for dizziness and headaches.  Psychiatric/Behavioral:  Positive for behavioral problems. Negative for sleep disturbance. The patient is not nervous/anxious.       Immunization History  Administered Date(s) Administered   DTaP 12/14/2020   Moderna Covid-19 Vaccine Bivalent Booster 1yrs & up 09/28/2021   Moderna Sars-Covid-2 Vaccination 03/10/2021   PFIZER(Purple Top)SARS-COV-2 Vaccination 12/07/2020   Pneumococcal Conjugate-13 10/28/2018   Pneumococcal Polysaccharide-23 09/08/2020   Tdap 12/14/2020   Pertinent  Health Maintenance Due  Topic Date Due   COLONOSCOPY (Pts 45-50yrs Insurance coverage will need to be confirmed)  Never done   INFLUENZA VACCINE  12/30/2021 (Originally 05/02/2021)   Fall Risk 03/22/2021 03/23/2021 03/25/2021 03/25/2021 03/31/2021  Falls in the past year? - - - - 0  Was there an injury with Fall? - - - - -  Fall Risk Category Calculator - - - - -  Fall Risk Category - - - - -  Patient Fall Risk Level High fall risk High fall risk High fall risk High fall risk High fall risk  Patient at Risk for Falls Due to - - - - Impaired balance/gait;Impaired mobility  Fall risk Follow up - - - - Falls evaluation completed     Vitals:   11/02/21 1114  BP: 137/87  Pulse: 80  Resp: 18  Temp: 97.9 F (36.6 C)  Height: 6' (1.829 m)   Body mass index is 27.07 kg/m.  Physical Exam Constitutional:      General: He is not in acute distress.    Appearance: Normal appearance.  HENT:     Head: Normocephalic and atraumatic.     Mouth/Throat:     Mouth: Mucous membranes are moist.  Eyes:     Conjunctiva/sclera: Conjunctivae normal.  Cardiovascular:     Rate and Rhythm: Normal rate. Rhythm irregular.     Pulses: Normal pulses.     Heart sounds: Normal heart sounds.  Pulmonary:     Effort: Pulmonary effort is  normal.     Breath sounds: Normal breath sounds.  Abdominal:     General: Bowel sounds are normal.     Palpations: Abdomen is soft.  Skin:    General: Skin is warm and dry.  Neurological:     Mental Status: He is alert. Mental status is at baseline.     Comments: Alert to self, disoriented to time and place.  Psychiatric:        Mood and Affect: Mood normal.        Behavior: Behavior normal.       Labs reviewed: Recent Labs    11/10/20 1508 11/10/20 2000 03/15/21 0348 03/16/21 0320 03/19/21 2115 03/20/21 0429 03/21/21 0501 03/22/21 0445 03/23/21 0500 03/25/21 0908 03/29/21 0000 04/05/21 0000 07/20/21 0000 08/15/21 0000  NA 167*   < > 140 143   < > 142   < > 140 139 137   < > 139 142 142  K 4.1   < > 3.6 3.9   < > 4.7   < > 4.5 4.4 4.2   < > 4.0 4.3 4.5  CL >130*   < > 106 106   < > 108   < > 102 104 103   < > 102 105 107  CO2 20*   < > 26 28   < > 26   < > 28 27 24    < > 25* 28* 26*  GLUCOSE 152*   < > 105* 102*   < > 109*   < > 114* 127* 151*  --   --   --   --   BUN 85*   < > 23 22   < > 30*   < > 33* 33* 30*   < > 13 25* 29*  CREATININE 2.58*   < > 1.16 1.30*   < > 2.16*   < > 2.52* 2.47* 2.29*   < > 1.0 1.3 0.9  CALCIUM 8.5*   < > 8.5* 8.7*   < > 8.7*   < > 8.6* 8.5* 8.6*   < > 9.0 9.3 9.3  MG  --    < > 1.9 1.9  --  2.1  --   --   --   --   --   --   --   --  PHOS 3.5  --   --   --   --  4.9*  --   --   --   --   --   --   --   --    < > = values in this interval not displayed.   Recent Labs    03/16/21 0320 03/19/21 2115 03/20/21 0429  AST 13* 11* 10*  ALT 9 10 8   ALKPHOS 41 44 44  BILITOT 0.5 0.4 0.7  PROT 5.0* 5.4* 5.6*  ALBUMIN 2.0* 2.0* 2.0*   Recent Labs    03/22/21 0445 03/23/21 0500 03/25/21 0908 03/29/21 0000 04/05/21 0000 08/15/21 0000  WBC 13.4* 13.7* 11.0* 8.9 6.6 13.1  NEUTROABS 10.8* 11.0* 8.8*  --   --   --   HGB 9.6* 9.9* 10.7* 10.2* 10.7* 13.6  HCT 30.3* 31.4* 35.7* 31* 32* 41  MCV 84.4 84.2 88.1  --   --   --   PLT 440*  464* 481* 426* 359 400*   Lab Results  Component Value Date   TSH 5.052 (H) 03/21/2021   Lab Results  Component Value Date   HGBA1C 5.3 03/11/2021   Lab Results  Component Value Date   CHOL 95 03/07/2021   HDL 24 (A) 03/07/2021   LDLCALC 44 03/07/2021   TRIG 137 03/07/2021   CHOLHDL 3.6 10/28/2018    Significant Diagnostic Results in last 30 days:  No results found.  Assessment/Plan  1. ACP (advance care planning) -   Remains to be DNR -    Discussed medications, vital signs and weights  2. Seizure-like activity (HCC) -   Continue Depakote DR -    Seizure precautions  3. Persistent atrial fibrillation (HCC) -    Rate controlled, continue diltiazem and amiodarone for rate control and Eliquis for anticoagulation  4. Idiopathic chronic gout of multiple sites without tophus -    Continue allopurinol  5. Hypotension, unspecified hypotension type -  BPs stable,  decreased midodrine 10 mg 3 times daily to 5 mg 3 times daily  6. Vascular dementia without behavioral disturbance (HCC) -  BIMS score 7/15, ranging in severe cognitive impairment -   Continue supportive care    Family/ staff Communication: Discussed plan of care with IDT.  Labs/tests ordered:   None    8/15, DNP, MSN, FNP-BC [facility] [phone] (Monday-Friday 8:00 a.m. - 5:00 p.m.) [phone] (after hours)

## 2021-11-03 DIAGNOSIS — I5022 Chronic systolic (congestive) heart failure: Secondary | ICD-10-CM | POA: Diagnosis not present

## 2021-11-03 DIAGNOSIS — R293 Abnormal posture: Secondary | ICD-10-CM | POA: Diagnosis not present

## 2021-11-03 DIAGNOSIS — M6259 Muscle wasting and atrophy, not elsewhere classified, multiple sites: Secondary | ICD-10-CM | POA: Diagnosis not present

## 2021-11-03 DIAGNOSIS — L89310 Pressure ulcer of right buttock, unstageable: Secondary | ICD-10-CM | POA: Diagnosis not present

## 2021-11-03 DIAGNOSIS — M6281 Muscle weakness (generalized): Secondary | ICD-10-CM | POA: Diagnosis not present

## 2021-11-03 DIAGNOSIS — R2681 Unsteadiness on feet: Secondary | ICD-10-CM | POA: Diagnosis not present

## 2021-11-04 DIAGNOSIS — R293 Abnormal posture: Secondary | ICD-10-CM | POA: Diagnosis not present

## 2021-11-04 DIAGNOSIS — L89310 Pressure ulcer of right buttock, unstageable: Secondary | ICD-10-CM | POA: Diagnosis not present

## 2021-11-04 DIAGNOSIS — R2681 Unsteadiness on feet: Secondary | ICD-10-CM | POA: Diagnosis not present

## 2021-11-04 DIAGNOSIS — M6281 Muscle weakness (generalized): Secondary | ICD-10-CM | POA: Diagnosis not present

## 2021-11-04 DIAGNOSIS — I5022 Chronic systolic (congestive) heart failure: Secondary | ICD-10-CM | POA: Diagnosis not present

## 2021-11-04 DIAGNOSIS — M6259 Muscle wasting and atrophy, not elsewhere classified, multiple sites: Secondary | ICD-10-CM | POA: Diagnosis not present

## 2021-11-05 DIAGNOSIS — Z13228 Encounter for screening for other metabolic disorders: Secondary | ICD-10-CM | POA: Diagnosis not present

## 2021-11-05 DIAGNOSIS — E119 Type 2 diabetes mellitus without complications: Secondary | ICD-10-CM | POA: Diagnosis not present

## 2021-11-05 LAB — BASIC METABOLIC PANEL
BUN: 20 (ref 4–21)
CO2: 25 — AB (ref 13–22)
Chloride: 107 (ref 99–108)
Creatinine: 1 (ref 0.6–1.3)
Glucose: 116
Potassium: 3.9 (ref 3.4–5.3)
Sodium: 142 (ref 137–147)

## 2021-11-05 LAB — CBC: RBC: 4.23 (ref 3.87–5.11)

## 2021-11-05 LAB — COMPREHENSIVE METABOLIC PANEL
Albumin: 3.5 (ref 3.5–5.0)
Calcium: 8.6 — AB (ref 8.7–10.7)
GFR calc Af Amer: 85.36
GFR calc non Af Amer: 73.65
Globulin: 1.9

## 2021-11-05 LAB — CBC AND DIFFERENTIAL
HCT: 37 — AB (ref 41–53)
Hemoglobin: 12 — AB (ref 13.5–17.5)
Neutrophils Absolute: 4.7
Platelets: 223 (ref 150–399)
WBC: 7.6

## 2021-11-05 LAB — HEPATIC FUNCTION PANEL
ALT: 10 (ref 10–40)
AST: 9 — AB (ref 14–40)
Alkaline Phosphatase: 107 (ref 25–125)
Bilirubin, Total: 0.3

## 2021-11-07 DIAGNOSIS — I5022 Chronic systolic (congestive) heart failure: Secondary | ICD-10-CM | POA: Diagnosis not present

## 2021-11-07 DIAGNOSIS — M6281 Muscle weakness (generalized): Secondary | ICD-10-CM | POA: Diagnosis not present

## 2021-11-07 DIAGNOSIS — L89310 Pressure ulcer of right buttock, unstageable: Secondary | ICD-10-CM | POA: Diagnosis not present

## 2021-11-07 DIAGNOSIS — R293 Abnormal posture: Secondary | ICD-10-CM | POA: Diagnosis not present

## 2021-11-07 DIAGNOSIS — M6259 Muscle wasting and atrophy, not elsewhere classified, multiple sites: Secondary | ICD-10-CM | POA: Diagnosis not present

## 2021-11-07 DIAGNOSIS — R2681 Unsteadiness on feet: Secondary | ICD-10-CM | POA: Diagnosis not present

## 2021-11-07 DIAGNOSIS — M8618 Other acute osteomyelitis, other site: Secondary | ICD-10-CM | POA: Diagnosis not present

## 2021-11-07 DIAGNOSIS — M4628 Osteomyelitis of vertebra, sacral and sacrococcygeal region: Secondary | ICD-10-CM | POA: Diagnosis not present

## 2021-11-07 DIAGNOSIS — A419 Sepsis, unspecified organism: Secondary | ICD-10-CM | POA: Diagnosis not present

## 2021-11-07 DIAGNOSIS — R279 Unspecified lack of coordination: Secondary | ICD-10-CM | POA: Diagnosis not present

## 2021-11-08 DIAGNOSIS — L89310 Pressure ulcer of right buttock, unstageable: Secondary | ICD-10-CM | POA: Diagnosis not present

## 2021-11-08 DIAGNOSIS — M6259 Muscle wasting and atrophy, not elsewhere classified, multiple sites: Secondary | ICD-10-CM | POA: Diagnosis not present

## 2021-11-08 DIAGNOSIS — M6281 Muscle weakness (generalized): Secondary | ICD-10-CM | POA: Diagnosis not present

## 2021-11-08 DIAGNOSIS — R2681 Unsteadiness on feet: Secondary | ICD-10-CM | POA: Diagnosis not present

## 2021-11-08 DIAGNOSIS — I5022 Chronic systolic (congestive) heart failure: Secondary | ICD-10-CM | POA: Diagnosis not present

## 2021-11-08 DIAGNOSIS — R293 Abnormal posture: Secondary | ICD-10-CM | POA: Diagnosis not present

## 2021-11-09 ENCOUNTER — Encounter: Payer: Self-pay | Admitting: Adult Health

## 2021-11-09 ENCOUNTER — Non-Acute Institutional Stay (SKILLED_NURSING_FACILITY): Payer: Medicare Other | Admitting: Adult Health

## 2021-11-09 ENCOUNTER — Other Ambulatory Visit: Payer: Self-pay | Admitting: Adult Health

## 2021-11-09 DIAGNOSIS — M6259 Muscle wasting and atrophy, not elsewhere classified, multiple sites: Secondary | ICD-10-CM | POA: Diagnosis not present

## 2021-11-09 DIAGNOSIS — R569 Unspecified convulsions: Secondary | ICD-10-CM | POA: Diagnosis not present

## 2021-11-09 DIAGNOSIS — R339 Retention of urine, unspecified: Secondary | ICD-10-CM

## 2021-11-09 DIAGNOSIS — F0150 Vascular dementia without behavioral disturbance: Secondary | ICD-10-CM | POA: Diagnosis not present

## 2021-11-09 DIAGNOSIS — I5022 Chronic systolic (congestive) heart failure: Secondary | ICD-10-CM | POA: Diagnosis not present

## 2021-11-09 DIAGNOSIS — M1A09X0 Idiopathic chronic gout, multiple sites, without tophus (tophi): Secondary | ICD-10-CM | POA: Diagnosis not present

## 2021-11-09 DIAGNOSIS — R2681 Unsteadiness on feet: Secondary | ICD-10-CM | POA: Diagnosis not present

## 2021-11-09 DIAGNOSIS — L89310 Pressure ulcer of right buttock, unstageable: Secondary | ICD-10-CM | POA: Diagnosis not present

## 2021-11-09 DIAGNOSIS — M6281 Muscle weakness (generalized): Secondary | ICD-10-CM | POA: Diagnosis not present

## 2021-11-09 DIAGNOSIS — I4819 Other persistent atrial fibrillation: Secondary | ICD-10-CM | POA: Diagnosis not present

## 2021-11-09 DIAGNOSIS — R293 Abnormal posture: Secondary | ICD-10-CM | POA: Diagnosis not present

## 2021-11-09 MED ORDER — TRAMADOL HCL 50 MG PO TABS: 25.0000 mg | ORAL_TABLET | Freq: Two times a day (BID) | ORAL | 0 refills | Status: DC | PRN

## 2021-11-09 NOTE — Progress Notes (Signed)
Location:  [place] Room Number: 106 Place of Service:  SNF (31) Provider:  Durenda Age, DNP, FNP-BC  Patient Care Team: Hendricks Limes, MD as PCP - General (Internal Medicine) Constance Haw, MD as PCP - Electrophysiology (Cardiology) Medina-Vargas, Senaida Lange, NP as Nurse Practitioner (Internal Medicine)  Extended Emergency Contact Information Primary Emergency Contact: Cherylann Parr States of [place] Phone: [phone] Relation: Daughter Secondary Emergency Contact: SAPP,RON Home Phone: [phone] Relation: Other  Code Status:  DNR  Goals of care: Advanced Directive information Advanced Directives 11/09/2021  Does Patient Have a Medical Advance Directive? Yes  Type of Advance Directive Out of facility DNR (pink MOST or yellow form)  Does patient want to make changes to medical advance directive? No - Patient declined  Would patient like information on creating a medical advance directive? -  Pre-existing out of facility DNR order (yellow form or pink MOST form) Yellow form placed in chart (order not valid for inpatient use)     Chief Complaint  Patient presents with   Medical Management of Chronic Issues    Routine follow up visit.    HPI:  Pt is a 71 y.o. male seen today for medical management of chronic diseases. He is a long-term care resident of [facility]. He has a PMH of chronic atrial fibrillation, chronic kidney disease is stage III, hypertension, prediabetes, gout and sacral osteomyelitis.  Seizure-like activity (HCC) -had 1 seizure-like episode last month, Depakote was increased to 125 mg 3 times a day  Idiopathic chronic gout of multiple sites without tophus -no gout flares, takes allopurinol 300 mg 1/2 tab = 150 mg daily  Persistent atrial fibrillation (HCC) -   takes amiodarone 200 mg 1 tab daily  Urinary retention -   no urinary retention, takes tamsulosin 0.4 mg 1 capsule  daily    Past Medical History:  Diagnosis Date   Chronic kidney disease    Chronic systolic CHF (congestive heart failure) (HCC)    EF 35-40, diffuse HK, mild MR, moderate LAE, mild RAE, PASP 44, L pleural eff   Dilated cardiomyopathy ([place])    likely related to tachycardia   Dysrhythmia    Persistent atrial fibrillation [facility])    Past Surgical History:  Procedure Laterality Date   CARDIOVERSION N/A 08/20/2017   Procedure: CARDIOVERSION;  Surgeon: Jerline Pain, MD;  Location: [place];  Service: Cardiovascular;  Laterality: N/A;   INCISION AND DRAINAGE ABSCESS Left 03/22/2014   Procedure: INCISION AND DRAINAGE ABSCESS LEFT BUTTOCK ABSCESS;  Surgeon: Zenovia Jarred, MD;  Location: [place];  Service: General;  Laterality: Left;    No Known Allergies  Outpatient Encounter Medications as of 11/09/2021  Medication Sig   allopurinol (ZYLOPRIM) 300 MG tablet Take 150 mg by mouth daily. (0800)   amiodarone (PACERONE) 200 MG tablet Take 200 mg by mouth daily. (0800)   apixaban (ELIQUIS) 5 MG TABS tablet Take 1 tablet (5 mg total) by mouth 2 (two) times daily.   atorvastatin (LIPITOR) 20 MG tablet Take 1 tablet (20 mg total) by mouth every evening.   Cholecalciferol (VITAMIN D) 50 MCG (2000 UT) tablet Take 2,000 Units by mouth daily. (0900)   colchicine 0.6 MG tablet Take 1.2 mg by mouth as directed. Take 2 tablets (1.2 mg) by mouth at the first sign of a gout flare up and then take one tablet in one hour if symptoms continue   diltiazem (CARDIZEM) 90 MG tablet Take 90 mg  by mouth every 8 (eight) hours. (0600, 1400 & 2200) Hold for SBP < 100 and Heart rate < 60   divalproex (DEPAKOTE SPRINKLE) 125 MG capsule Take 125 mg by mouth 3 (three) times daily. (0800, 1200 & 2000)   Menthol, Topical Analgesic, (BIOFREEZE) 4 % GEL Apply 1 application topically in the morning, at noon, and at bedtime. (0600, 1400 & 2200) APPLY TOPICALLY TO LEFT SHOULDER THREE TIMES DAILY FOR PAIN   NON FORMULARY MAGIC  CUP: ONE MAGIC CUP DAILY FOR WOUND HEALING   NON FORMULARY Diet: Regular/thin diet consistency   pantoprazole (PROTONIX) 40 MG tablet Take 1 tablet (40 mg total) by mouth daily.   polyethylene glycol (MIRALAX / GLYCOLAX) 17 g packet Take 17 g by mouth daily. (0800)   tamsulosin (FLOMAX) 0.4 MG CAPS capsule Take 1 capsule (0.4 mg total) by mouth daily after supper.   traMADol (ULTRAM) 50 MG tablet Take 0.5 tablets (25 mg total) by mouth 2 (two) times daily as needed.   vitamin C (ASCORBIC ACID) 500 MG tablet Take 500 mg by mouth 2 (two) times daily. (0800 & 2000)   [DISCONTINUED] midodrine (PROAMATINE) 10 MG tablet Take 1 tablet (10 mg total) by mouth 3 (three) times daily with meals.   [DISCONTINUED] Ensure (ENSURE) Take 237 mLs by mouth daily at 12 noon.   No facility-administered encounter medications on file as of 11/09/2021.    Review of Systems  Constitutional:  Negative for activity change, appetite change and fever.  HENT:  Negative for sore throat.   Eyes: Negative.   Cardiovascular:  Negative for chest pain and leg swelling.  Gastrointestinal:  Negative for abdominal distention, diarrhea and vomiting.  Genitourinary:  Negative for dysuria, frequency and urgency.  Skin:  Negative for color change.  Neurological:  Negative for dizziness and headaches.  Psychiatric/Behavioral:  Negative for behavioral problems and sleep disturbance. The patient is not nervous/anxious.       Immunization History  Administered Date(s) Administered   DTaP 12/14/2020   Fluad Quad(high Dose 65+) 08/18/2021   Moderna Covid-19 Vaccine Bivalent Booster 10yrs & up 09/28/2021   Moderna Sars-Covid-2 Vaccination 03/10/2021   PFIZER(Purple Top)SARS-COV-2 Vaccination 12/07/2020   Pneumococcal Conjugate-13 10/28/2018   Pneumococcal Polysaccharide-23 09/08/2020   Tdap 12/14/2020   Pertinent  Health Maintenance Due  Topic Date Due   COLONOSCOPY (Pts 45-53yrs Insurance coverage will need to be confirmed)   Never done   INFLUENZA VACCINE  Completed   Fall Risk 03/22/2021 03/23/2021 03/25/2021 03/25/2021 03/31/2021  Falls in the past year? - - - - 0  Was there an injury with Fall? - - - - -  Fall Risk Category Calculator - - - - -  Fall Risk Category - - - - -  Patient Fall Risk Level High fall risk High fall risk High fall risk High fall risk High fall risk  Patient at Risk for Falls Due to - - - - Impaired balance/gait;Impaired mobility  Fall risk Follow up - - - - Falls evaluation completed     Vitals:   11/09/21 1401  BP: 123/80  Pulse: 81  Resp: 18  Temp: (!) 97.5 F (36.4 C)  Weight: 207 lb 12.8 oz (94.3 kg)  Height: 6' (1.829 m)   Body mass index is 28.18 kg/m.  Physical Exam Constitutional:      General: He is not in acute distress.    Appearance: Normal appearance.  HENT:     Head: Normocephalic and atraumatic.  Mouth/Throat:     Mouth: Mucous membranes are moist.  Eyes:     Conjunctiva/sclera: Conjunctivae normal.  Cardiovascular:     Rate and Rhythm: Normal rate. Rhythm irregular.     Pulses: Normal pulses.     Heart sounds: Normal heart sounds.  Pulmonary:     Effort: Pulmonary effort is normal.     Breath sounds: Normal breath sounds.  Abdominal:     General: Bowel sounds are normal.     Palpations: Abdomen is soft.  Musculoskeletal:        General: No swelling.     Cervical back: Normal range of motion.  Skin:    General: Skin is warm and dry.  Neurological:     Mental Status: He is alert. Mental status is at baseline. He is disoriented.     Comments: Alert to self, disoriented to time and place.  Psychiatric:        Mood and Affect: Mood normal.        Behavior: Behavior normal.       Labs reviewed: Recent Labs    03/15/21 0348 03/16/21 0320 03/19/21 2115 03/20/21 0429 03/21/21 0501 03/22/21 0445 03/23/21 0500 03/25/21 0908 03/29/21 0000 10/05/21 0000 10/12/21 0000 11/05/21 0000  NA 140 143   < > 142   < > 140 139 137   < > 140 139  142  K 3.6 3.9   < > 4.7   < > 4.5 4.4 4.2   < > 4.2 4.3 3.9  CL 106 106   < > 108   < > 102 104 103   < > 105 104 107  CO2 26 28   < > 26   < > 28 27 24    < > 26* 26* 25*  GLUCOSE 105* 102*   < > 109*   < > 114* 127* 151*  --   --   --   --   BUN 23 22   < > 30*   < > 33* 33* 30*   < > 21 21 20   CREATININE 1.16 1.30*   < > 2.16*   < > 2.52* 2.47* 2.29*   < > 1.3 1.3 1.0  CALCIUM 8.5* 8.7*   < > 8.7*   < > 8.6* 8.5* 8.6*   < > 8.8 8.5* 8.6*  MG 1.9 1.9  --  2.1  --   --   --   --   --   --   --   --   PHOS  --   --   --  4.9*  --   --   --   --   --   --   --   --    < > = values in this interval not displayed.   Recent Labs    03/16/21 0320 03/19/21 2115 03/20/21 0429 10/05/21 0000 11/05/21 0000  AST 13* 11* 10* 12* 9*  ALT 9 10 8 14 10   ALKPHOS 41 44 44 106 107  BILITOT 0.5 0.4 0.7  --   --   PROT 5.0* 5.4* 5.6*  --   --   ALBUMIN 2.0* 2.0* 2.0* 3.3* 3.5   Recent Labs    03/22/21 0445 03/23/21 0500 03/25/21 0908 03/29/21 0000 10/05/21 0000 10/12/21 0000 11/05/21 0000  WBC 13.4* 13.7* 11.0*   < > 7.5 8.0 7.6  NEUTROABS 10.8* 11.0* 8.8*  --  4.50 5.60 4.70  HGB 9.6* 9.9* 10.7*   < >  12.3* 13.0* 12.0*  HCT 30.3* 31.4* 35.7*   < > 38* 38* 37*  MCV 84.4 84.2 88.1  --   --   --   --   PLT 440* 464* 481*   < > 284 300 223   < > = values in this interval not displayed.   Lab Results  Component Value Date   TSH 7.00 (A) 10/20/2021   Lab Results  Component Value Date   HGBA1C 5.3 03/11/2021   Lab Results  Component Value Date   CHOL 135 10/12/2021   HDL 31 (A) 10/12/2021   LDLCALC 81 10/12/2021   TRIG 120 10/12/2021   CHOLHDL 3.6 10/28/2018    Significant Diagnostic Results in last 30 days:  No results found.  Assessment/Plan  1. Seizure-like activity (HCC) -Had 1 episode of seizure-like activity -Depakote was increased from 125 mg twice to 3 times daily  2. Idiopathic chronic gout of multiple sites without tophus -Stable, continue allopurinol  3.  Persistent atrial fibrillation (HCC) -   Rate controlled, continue Eliquis for anticoagulation and diltiazem and amiodarone for rate control  4. Urinary retention -Denies urinary retention, continue tamsulosin  5. Vascular dementia without behavioral disturbance ([place]) -   BIMS score 7/15, ranging in severe cognitive impairment -   Continue supportive care    Family/ staff Communication: Discussed plan of care with resident and charge nurse.  Labs/tests ordered:   None    Durenda Age, DNP, MSN, FNP-BC [facility] [phone] (Monday-Friday 8:00 a.m. - 5:00 p.m.) [phone] (after hours)

## 2021-11-10 ENCOUNTER — Encounter (HOSPITAL_COMMUNITY): Payer: Self-pay | Admitting: Internal Medicine

## 2021-11-10 DIAGNOSIS — R293 Abnormal posture: Secondary | ICD-10-CM | POA: Diagnosis not present

## 2021-11-10 DIAGNOSIS — R2681 Unsteadiness on feet: Secondary | ICD-10-CM | POA: Diagnosis not present

## 2021-11-10 DIAGNOSIS — L89310 Pressure ulcer of right buttock, unstageable: Secondary | ICD-10-CM | POA: Diagnosis not present

## 2021-11-10 DIAGNOSIS — M6281 Muscle weakness (generalized): Secondary | ICD-10-CM | POA: Diagnosis not present

## 2021-11-10 DIAGNOSIS — I5022 Chronic systolic (congestive) heart failure: Secondary | ICD-10-CM | POA: Diagnosis not present

## 2021-11-10 DIAGNOSIS — M6259 Muscle wasting and atrophy, not elsewhere classified, multiple sites: Secondary | ICD-10-CM | POA: Diagnosis not present

## 2021-11-10 DIAGNOSIS — B351 Tinea unguium: Secondary | ICD-10-CM | POA: Diagnosis not present

## 2021-11-10 DIAGNOSIS — L603 Nail dystrophy: Secondary | ICD-10-CM | POA: Diagnosis not present

## 2021-11-10 DIAGNOSIS — I739 Peripheral vascular disease, unspecified: Secondary | ICD-10-CM | POA: Diagnosis not present

## 2021-11-11 DIAGNOSIS — R2681 Unsteadiness on feet: Secondary | ICD-10-CM | POA: Diagnosis not present

## 2021-11-11 DIAGNOSIS — M6281 Muscle weakness (generalized): Secondary | ICD-10-CM | POA: Diagnosis not present

## 2021-11-11 DIAGNOSIS — R293 Abnormal posture: Secondary | ICD-10-CM | POA: Diagnosis not present

## 2021-11-11 DIAGNOSIS — I5022 Chronic systolic (congestive) heart failure: Secondary | ICD-10-CM | POA: Diagnosis not present

## 2021-11-11 DIAGNOSIS — L89310 Pressure ulcer of right buttock, unstageable: Secondary | ICD-10-CM | POA: Diagnosis not present

## 2021-11-11 DIAGNOSIS — M6259 Muscle wasting and atrophy, not elsewhere classified, multiple sites: Secondary | ICD-10-CM | POA: Diagnosis not present

## 2021-11-14 DIAGNOSIS — M6259 Muscle wasting and atrophy, not elsewhere classified, multiple sites: Secondary | ICD-10-CM | POA: Diagnosis not present

## 2021-11-14 DIAGNOSIS — R279 Unspecified lack of coordination: Secondary | ICD-10-CM | POA: Diagnosis not present

## 2021-11-14 DIAGNOSIS — M6281 Muscle weakness (generalized): Secondary | ICD-10-CM | POA: Diagnosis not present

## 2021-11-14 DIAGNOSIS — L89310 Pressure ulcer of right buttock, unstageable: Secondary | ICD-10-CM | POA: Diagnosis not present

## 2021-11-14 DIAGNOSIS — M4628 Osteomyelitis of vertebra, sacral and sacrococcygeal region: Secondary | ICD-10-CM | POA: Diagnosis not present

## 2021-11-14 DIAGNOSIS — M8618 Other acute osteomyelitis, other site: Secondary | ICD-10-CM | POA: Diagnosis not present

## 2021-11-14 DIAGNOSIS — A419 Sepsis, unspecified organism: Secondary | ICD-10-CM | POA: Diagnosis not present

## 2021-11-14 DIAGNOSIS — R293 Abnormal posture: Secondary | ICD-10-CM | POA: Diagnosis not present

## 2021-11-14 DIAGNOSIS — I5022 Chronic systolic (congestive) heart failure: Secondary | ICD-10-CM | POA: Diagnosis not present

## 2021-11-14 DIAGNOSIS — R2681 Unsteadiness on feet: Secondary | ICD-10-CM | POA: Diagnosis not present

## 2021-11-15 DIAGNOSIS — R293 Abnormal posture: Secondary | ICD-10-CM | POA: Diagnosis not present

## 2021-11-15 DIAGNOSIS — M6259 Muscle wasting and atrophy, not elsewhere classified, multiple sites: Secondary | ICD-10-CM | POA: Diagnosis not present

## 2021-11-15 DIAGNOSIS — M6281 Muscle weakness (generalized): Secondary | ICD-10-CM | POA: Diagnosis not present

## 2021-11-15 DIAGNOSIS — R2681 Unsteadiness on feet: Secondary | ICD-10-CM | POA: Diagnosis not present

## 2021-11-15 DIAGNOSIS — L89310 Pressure ulcer of right buttock, unstageable: Secondary | ICD-10-CM | POA: Diagnosis not present

## 2021-11-15 DIAGNOSIS — I5022 Chronic systolic (congestive) heart failure: Secondary | ICD-10-CM | POA: Diagnosis not present

## 2021-11-16 DIAGNOSIS — L89310 Pressure ulcer of right buttock, unstageable: Secondary | ICD-10-CM | POA: Diagnosis not present

## 2021-11-16 DIAGNOSIS — M6281 Muscle weakness (generalized): Secondary | ICD-10-CM | POA: Diagnosis not present

## 2021-11-16 DIAGNOSIS — R2681 Unsteadiness on feet: Secondary | ICD-10-CM | POA: Diagnosis not present

## 2021-11-16 DIAGNOSIS — M6259 Muscle wasting and atrophy, not elsewhere classified, multiple sites: Secondary | ICD-10-CM | POA: Diagnosis not present

## 2021-11-16 DIAGNOSIS — R293 Abnormal posture: Secondary | ICD-10-CM | POA: Diagnosis not present

## 2021-11-16 DIAGNOSIS — I5022 Chronic systolic (congestive) heart failure: Secondary | ICD-10-CM | POA: Diagnosis not present

## 2021-11-17 DIAGNOSIS — M6259 Muscle wasting and atrophy, not elsewhere classified, multiple sites: Secondary | ICD-10-CM | POA: Diagnosis not present

## 2021-11-17 DIAGNOSIS — R2681 Unsteadiness on feet: Secondary | ICD-10-CM | POA: Diagnosis not present

## 2021-11-17 DIAGNOSIS — I5022 Chronic systolic (congestive) heart failure: Secondary | ICD-10-CM | POA: Diagnosis not present

## 2021-11-17 DIAGNOSIS — M6281 Muscle weakness (generalized): Secondary | ICD-10-CM | POA: Diagnosis not present

## 2021-11-17 DIAGNOSIS — R293 Abnormal posture: Secondary | ICD-10-CM | POA: Diagnosis not present

## 2021-11-17 DIAGNOSIS — L89310 Pressure ulcer of right buttock, unstageable: Secondary | ICD-10-CM | POA: Diagnosis not present

## 2021-11-18 ENCOUNTER — Ambulatory Visit (HOSPITAL_COMMUNITY): Payer: Medicare Other | Admitting: Certified Registered Nurse Anesthetist

## 2021-11-18 ENCOUNTER — Encounter (HOSPITAL_COMMUNITY)
Admission: RE | Disposition: A | Discharge status: Home / Self Care | Payer: Medicare Other | Source: Home / Self Care | Attending: Internal Medicine

## 2021-11-18 ENCOUNTER — Encounter (HOSPITAL_COMMUNITY): Payer: Self-pay | Admitting: Internal Medicine

## 2021-11-18 ENCOUNTER — Other Ambulatory Visit: Payer: Self-pay

## 2021-11-18 ENCOUNTER — Ambulatory Visit (HOSPITAL_BASED_OUTPATIENT_CLINIC_OR_DEPARTMENT_OTHER): Payer: Medicare Other | Admitting: Certified Registered Nurse Anesthetist

## 2021-11-18 ENCOUNTER — Ambulatory Visit (HOSPITAL_COMMUNITY)
Admission: RE | Admit: 2021-11-18 | Discharge: 2021-11-18 | Disposition: A | Discharge status: Home / Self Care | Payer: Medicare Other | Attending: Internal Medicine | Admitting: Internal Medicine

## 2021-11-18 DIAGNOSIS — I110 Hypertensive heart disease with heart failure: Secondary | ICD-10-CM | POA: Diagnosis not present

## 2021-11-18 DIAGNOSIS — Z8616 Personal history of COVID-19: Secondary | ICD-10-CM | POA: Diagnosis not present

## 2021-11-18 DIAGNOSIS — L89310 Pressure ulcer of right buttock, unstageable: Secondary | ICD-10-CM | POA: Diagnosis not present

## 2021-11-18 DIAGNOSIS — Z7901 Long term (current) use of anticoagulants: Secondary | ICD-10-CM | POA: Insufficient documentation

## 2021-11-18 DIAGNOSIS — Z79899 Other long term (current) drug therapy: Secondary | ICD-10-CM | POA: Insufficient documentation

## 2021-11-18 DIAGNOSIS — R293 Abnormal posture: Secondary | ICD-10-CM | POA: Diagnosis not present

## 2021-11-18 DIAGNOSIS — D649 Anemia, unspecified: Secondary | ICD-10-CM

## 2021-11-18 DIAGNOSIS — D6869 Other thrombophilia: Secondary | ICD-10-CM | POA: Insufficient documentation

## 2021-11-18 DIAGNOSIS — I130 Hypertensive heart and chronic kidney disease with heart failure and stage 1 through stage 4 chronic kidney disease, or unspecified chronic kidney disease: Secondary | ICD-10-CM | POA: Insufficient documentation

## 2021-11-18 DIAGNOSIS — I4819 Other persistent atrial fibrillation: Secondary | ICD-10-CM | POA: Insufficient documentation

## 2021-11-18 DIAGNOSIS — N183 Chronic kidney disease, stage 3 unspecified: Secondary | ICD-10-CM | POA: Insufficient documentation

## 2021-11-18 DIAGNOSIS — I5022 Chronic systolic (congestive) heart failure: Secondary | ICD-10-CM | POA: Diagnosis not present

## 2021-11-18 DIAGNOSIS — M6259 Muscle wasting and atrophy, not elsewhere classified, multiple sites: Secondary | ICD-10-CM | POA: Diagnosis not present

## 2021-11-18 DIAGNOSIS — I4891 Unspecified atrial fibrillation: Secondary | ICD-10-CM

## 2021-11-18 DIAGNOSIS — R569 Unspecified convulsions: Secondary | ICD-10-CM | POA: Insufficient documentation

## 2021-11-18 DIAGNOSIS — I509 Heart failure, unspecified: Secondary | ICD-10-CM

## 2021-11-18 DIAGNOSIS — R2681 Unsteadiness on feet: Secondary | ICD-10-CM | POA: Diagnosis not present

## 2021-11-18 DIAGNOSIS — M6281 Muscle weakness (generalized): Secondary | ICD-10-CM | POA: Diagnosis not present

## 2021-11-18 HISTORY — PX: CARDIOVERSION: SHX1299

## 2021-11-18 SURGERY — CARDIOVERSION
Anesthesia: General

## 2021-11-18 MED ORDER — SODIUM CHLORIDE 0.9 % IV SOLN: INTRAVENOUS | Status: DC

## 2021-11-18 MED ORDER — SODIUM CHLORIDE 0.9 % IV SOLN: INTRAVENOUS | Status: DC | PRN

## 2021-11-18 MED ORDER — LIDOCAINE 2% (20 MG/ML) 5 ML SYRINGE
INTRAMUSCULAR | Status: DC | PRN
Start: 2021-11-18 — End: 2021-11-18
  Administered 2021-11-18: 60 mg via INTRAVENOUS

## 2021-11-18 MED ORDER — PROPOFOL 10 MG/ML IV BOLUS
INTRAVENOUS | Status: DC | PRN
  Administered 2021-11-18: 50 mg via INTRAVENOUS

## 2021-11-18 NOTE — Anesthesia Preprocedure Evaluation (Signed)
Anesthesia Evaluation  Patient identified by MRN, date of birth, ID band Patient awake    Reviewed: Allergy & Precautions, NPO status , Patient's Chart, lab work & pertinent test results  Airway Mallampati: II  TM Distance: >3 FB Neck ROM: Full    Dental  (+) Teeth Intact, Dental Advisory Given   Pulmonary neg pulmonary ROS,    Pulmonary exam normal breath sounds clear to auscultation       Cardiovascular hypertension, Pt. on medications +CHF  + dysrhythmias Atrial Fibrillation  Rhythm:Irregular Rate:Abnormal  Echo 11/10/20: 1. Left ventricular ejection fraction, by estimation, is 60 to 65%. The  left ventricle has normal function. The left ventricle has no regional  wall motion abnormalities. There is moderate left ventricular hypertrophy.  Left ventricular diastolic  parameters are indeterminate.  2. Right ventricular systolic function is normal. The right ventricular  size is normal.  3. Left atrial size was mildly dilated.  4. The mitral valve is normal in structure. Trivial mitral valve  regurgitation. No evidence of mitral stenosis.  5. The aortic valve was not well visualized. Aortic valve regurgitation  is not visualized. No aortic stenosis is present.  6. The inferior vena cava is dilated in size with >50% respiratory  variability, suggesting right atrial pressure of 8 mmHg.    Neuro/Psych Seizures -,  negative psych ROS   GI/Hepatic negative GI ROS, Neg liver ROS,   Endo/Other  negative endocrine ROS  Renal/GU Renal InsufficiencyRenal disease     Musculoskeletal negative musculoskeletal ROS (+)   Abdominal   Peds  Hematology  (+) Blood dyscrasia (Eliquis), anemia ,   Anesthesia Other Findings Day of surgery medications reviewed with the patient.  Reproductive/Obstetrics                             Anesthesia Physical Anesthesia Plan  ASA: 3  Anesthesia Plan: General    Post-op Pain Management:    Induction: Intravenous  PONV Risk Score and Plan: 2 and TIVA and Treatment may vary due to age or medical condition  Airway Management Planned: Mask  Additional Equipment:   Intra-op Plan:   Post-operative Plan:   Informed Consent: I have reviewed the patients History and Physical, chart, labs and discussed the procedure including the risks, benefits and alternatives for the proposed anesthesia with the patient or authorized representative who has indicated his/her understanding and acceptance.     Dental advisory given  Plan Discussed with: CRNA  Anesthesia Plan Comments:         Anesthesia Quick Evaluation

## 2021-11-18 NOTE — Anesthesia Procedure Notes (Signed)
Procedure Name: MAC Date/Time: 11/18/2021 10:49 AM Performed by: Inda Coke, CRNA Patient Re-evaluated:Patient Re-evaluated prior to induction Oxygen Delivery Method: Ambu bag Preoxygenation: Pre-oxygenation with 100% oxygen Induction Type: IV induction Dental Injury: Teeth and Oropharynx as per pre-operative assessment

## 2021-11-18 NOTE — Transfer of Care (Signed)
Immediate Anesthesia Transfer of Care Note  Patient: Jeffery Christian  Procedure(s) Performed: CARDIOVERSION  Patient Location: Endoscopy Unit  Anesthesia Type:General  Level of Consciousness: awake, alert  and oriented  Airway & Oxygen Therapy: Patient Spontanous Breathing  Post-op Assessment: Report given to RN and Post -op Vital signs reviewed and stable  Post vital signs: Reviewed and stable  Last Vitals:  Vitals Value Taken Time  BP 109/67 11/18/21 1104  Temp    Pulse 57 11/18/21 1105  Resp 13 11/18/21 1105  SpO2 98 % 11/18/21 1105  Vitals shown include unvalidated device data.  Last Pain:  Vitals:   11/18/21 1101  TempSrc:   PainSc: Asleep         Complications: No notable events documented.

## 2021-11-18 NOTE — Interval H&P Note (Signed)
History and Physical Interval Note:  11/18/2021 10:07 AM  Jeffery Christian  has presented today for surgery, with the diagnosis of AFIB.  The various methods of treatment have been discussed with the patient and family. After consideration of risks, benefits and other options for treatment, the patient has consented to  Procedure(s): CARDIOVERSION (N/A) as a surgical intervention.  The patient's history has been reviewed, patient examined, no change in status, stable for surgery.  I have reviewed the patient's chart and labs.  Questions were answered to the patient's satisfaction.     [name]

## 2021-11-18 NOTE — Anesthesia Postprocedure Evaluation (Signed)
Anesthesia Post Note  Patient: Grigor Lipschutz  Procedure(s) Performed: CARDIOVERSION     Patient location during evaluation: Endoscopy Anesthesia Type: General Level of consciousness: oriented, awake and alert and awake Pain management: pain level controlled Vital Signs Assessment: post-procedure vital signs reviewed and stable Respiratory status: spontaneous breathing, nonlabored ventilation, respiratory function stable and patient connected to nasal cannula oxygen Cardiovascular status: blood pressure returned to baseline and stable Postop Assessment: no headache, no backache and no apparent nausea or vomiting Anesthetic complications: no   No notable events documented.  Last Vitals:  Vitals:   11/18/21 1110 11/18/21 1120  BP: 116/76 116/74  Pulse: (!) 57 (!) 58  Resp: 12 16  Temp:    SpO2: 98% 99%    Last Pain:  Vitals:   11/18/21 1120  TempSrc:   PainSc: 0-No pain                 Collene Schlichter

## 2021-11-18 NOTE — Discharge Instructions (Signed)

## 2021-11-18 NOTE — CV Procedure (Signed)
° °  CARDIOVERSION NOTE  Procedure: Electrical Cardioversion Indications:  Atrial Fibrillation  Procedure Details:  Consent: Risks of procedure as well as the alternatives and risks of each were explained to the (patient/caregiver).  Consent for procedure obtained.  Time Out: Verified patient identification, verified procedure, site/side was marked, verified correct patient position, special equipment/implants available, medications/allergies/relevent history reviewed, required imaging and test results available.  Performed  Patient placed on cardiac monitor, pulse oximetry, supplemental oxygen as necessary.  Sedation given:  propofol per anesthesia Pacer pads placed anterior and posterior chest.  Cardioverted 1 time(s).  Cardioverted at 150J biphasic.  Impression: Findings: Post procedure EKG shows: NSR Complications: None Patient did tolerate procedure well.  Plan: Successful DCCV with a single 150J biphasic shock to NSR.  Time Spent Directly with the Patient:  30 minutes   [name], MD, [facility], [place] Director of the [facility]  Card[facility]ovascular [facility] Diplomate of the American Board of Clinical Lipidology Attending Cardiologist  Direct Dial: [phone]   Fax: [phone]  Website:  [email or web address] 11/18/2021, 11:07 AM

## 2021-11-20 ENCOUNTER — Encounter (HOSPITAL_COMMUNITY): Payer: Self-pay | Admitting: Internal Medicine

## 2021-11-21 DIAGNOSIS — R293 Abnormal posture: Secondary | ICD-10-CM | POA: Diagnosis not present

## 2021-11-21 DIAGNOSIS — M6281 Muscle weakness (generalized): Secondary | ICD-10-CM | POA: Diagnosis not present

## 2021-11-21 DIAGNOSIS — R2681 Unsteadiness on feet: Secondary | ICD-10-CM | POA: Diagnosis not present

## 2021-11-21 DIAGNOSIS — M6259 Muscle wasting and atrophy, not elsewhere classified, multiple sites: Secondary | ICD-10-CM | POA: Diagnosis not present

## 2021-11-21 DIAGNOSIS — I5022 Chronic systolic (congestive) heart failure: Secondary | ICD-10-CM | POA: Diagnosis not present

## 2021-11-21 DIAGNOSIS — L89310 Pressure ulcer of right buttock, unstageable: Secondary | ICD-10-CM | POA: Diagnosis not present

## 2021-11-21 DIAGNOSIS — I10 Essential (primary) hypertension: Secondary | ICD-10-CM | POA: Diagnosis not present

## 2021-11-21 LAB — TSH: TSH: 10.55 — AB (ref 0.41–5.90)

## 2021-11-22 ENCOUNTER — Encounter: Payer: Self-pay | Admitting: Adult Health

## 2021-11-22 ENCOUNTER — Non-Acute Institutional Stay (SKILLED_NURSING_FACILITY): Payer: Medicare Other | Admitting: Adult Health

## 2021-11-22 DIAGNOSIS — I4819 Other persistent atrial fibrillation: Secondary | ICD-10-CM

## 2021-11-22 DIAGNOSIS — N179 Acute kidney failure, unspecified: Secondary | ICD-10-CM | POA: Diagnosis not present

## 2021-11-22 DIAGNOSIS — M6281 Muscle weakness (generalized): Secondary | ICD-10-CM | POA: Diagnosis not present

## 2021-11-22 DIAGNOSIS — I5022 Chronic systolic (congestive) heart failure: Secondary | ICD-10-CM | POA: Diagnosis not present

## 2021-11-22 DIAGNOSIS — R293 Abnormal posture: Secondary | ICD-10-CM | POA: Diagnosis not present

## 2021-11-22 DIAGNOSIS — L89310 Pressure ulcer of right buttock, unstageable: Secondary | ICD-10-CM | POA: Diagnosis not present

## 2021-11-22 DIAGNOSIS — E039 Hypothyroidism, unspecified: Secondary | ICD-10-CM

## 2021-11-22 DIAGNOSIS — M6259 Muscle wasting and atrophy, not elsewhere classified, multiple sites: Secondary | ICD-10-CM | POA: Diagnosis not present

## 2021-11-22 DIAGNOSIS — R2681 Unsteadiness on feet: Secondary | ICD-10-CM | POA: Diagnosis not present

## 2021-11-22 NOTE — Progress Notes (Signed)
Location:  [facility] Room Number: 106-A Place of Service:  SNF (31) Provider:  Kenard Gower, DNP, FNP-BC  Patient Care Team: Pecola Lawless, MD as PCP - General (Internal Medicine) Regan Lemming, MD as PCP - Electrophysiology (Cardiology) Medina-Vargas, Margit Banda, NP as Nurse Practitioner (Internal Medicine)  Extended Emergency Contact Information Primary Emergency Contact: Glendora Score [place] Home Phone: [phone] Relation: Daughter Secondary Emergency Contact: SAPP,RON Home Phone: [phone] Relation: Other  Code Status:  DNR  Goals of care: Advanced Directive information Advanced Directives 11/22/2021  Does Patient Have a Medical Advance Directive? Yes  Type of Advance Directive Out of facility DNR (pink MOST or yellow form)  Does patient want to make changes to medical advance directive? No - Patient declined  Would patient like information on creating a medical advance directive? -  Pre-existing out of facility DNR order (yellow form or pink MOST form) -     Chief Complaint  Patient presents with   Acute Visit    Elevated TSH    HPI:  Pt is a 71 y.o. male seen today for an acute visit for elevated tsh 10.55, up from tsh 7.00 a month ago. He had a cardioversion on 11/18/21. He takes amiodarone 200 mg 1 tab daily and diltiazem 90 mg 1 tab every 8 hours for atrial fibrillation.    Past Medical History:  Diagnosis Date   Chronic kidney disease    Chronic systolic CHF (congestive heart failure) (HCC)    EF 35-40, diffuse HK, mild MR, moderate LAE, mild RAE, PASP 44, L pleural eff   Dilated cardiomyopathy (HCC)    likely related to tachycardia   Dysrhythmia    Persistent atrial fibrillation [facility])    Past Surgical History:  Procedure Laterality Date   CARDIOVERSION N/A 08/20/2017   Procedure: CARDIOVERSION;  Surgeon: [name], MD;  Location: [facility] ENDOSCOPY;  Service: Cardiovascular;  Laterality:  N/A;   CARDIOVERSION N/A 11/18/2021   Procedure: CARDIOVERSION;  Surgeon: [name], MD;  Location: [facility] ENDOSCOPY;  Service: Cardiovascular;  Laterality: N/A;   INCISION AND DRAINAGE ABSCESS Left 03/22/2014   Procedure: INCISION AND DRAINAGE ABSCESS LEFT BUTTOCK ABSCESS;  Surgeon: [name], MD;  Location: [facility] OR;  Service: General;  Laterality: Left;    No Known Allergies  Outpatient Encounter Medications as of 11/22/2021  Medication Sig   allopurinol (ZYLOPRIM) 300 MG tablet Take 150 mg by mouth daily. (0800)   amiodarone (PACERONE) 200 MG tablet Take 200 mg by mouth daily. (0800)   apixaban (ELIQUIS) 5 MG TABS tablet Take 1 tablet (5 mg total) by mouth 2 (two) times daily.   atorvastatin (LIPITOR) 20 MG tablet Take 1 tablet (20 mg total) by mouth every evening.   bisacodyl (DULCOLAX) 10 MG suppository Place 10 mg rectally daily as needed for moderate constipation.   Cholecalciferol (VITAMIN D) 50 MCG (2000 UT) tablet Take 2,000 Units by mouth daily. (0900)   colchicine 0.6 MG tablet Take 1.2 mg by mouth as directed. Take 2 tablets (1.2 mg) by mouth at the first sign of a gout flare up and then take one tablet in one hour if symptoms continue   diltiazem (CARDIZEM) 90 MG tablet Take 90 mg by mouth every 8 (eight) hours. (0600, 1400 & 2200) Hold for SBP < 100 and Heart rate < 60   divalproex (DEPAKOTE SPRINKLE) 125 MG capsule Take 125 mg by mouth 3 (three) times daily. (0800, 1200 & 2000)  magnesium hydroxide (MILK OF MAGNESIA) 400 MG/5ML suspension Take 30 mLs by mouth daily as needed for mild constipation.   Menthol, Topical Analgesic, (BIOFREEZE) 4 % GEL Apply 1 application topically in the morning, at noon, and at bedtime. (0600, 1400 & 2200) APPLY TOPICALLY TO LEFT SHOULDER THREE TIMES DAILY FOR PAIN   midodrine (PROAMATINE) 5 MG tablet Take 5 mg by mouth 3 (three) times daily. (1000, 1400 & 2000)   NON FORMULARY MAGIC CUP: ONE MAGIC CUP DAILY FOR WOUND HEALING   NON FORMULARY  Diet: Regular/thin diet consistency   pantoprazole (PROTONIX) 40 MG tablet Take 1 tablet (40 mg total) by mouth daily.   polyethylene glycol (MIRALAX / GLYCOLAX) 17 g packet Take 17 g by mouth daily. (0800)   Sodium Phosphates (RA SALINE ENEMA) 19-7 GM/118ML ENEM Place 1 Bottle rectally daily as needed (severe constipation.).   tamsulosin (FLOMAX) 0.4 MG CAPS capsule Take 1 capsule (0.4 mg total) by mouth daily after supper.   traMADol (ULTRAM) 50 MG tablet Take 0.5 tablets (25 mg total) by mouth 2 (two) times daily as needed.   vitamin C (ASCORBIC ACID) 500 MG tablet Take 500 mg by mouth 2 (two) times daily. (0800 & 2000)   No facility-administered encounter medications on file as of 11/22/2021.    Review of Systems  Constitutional:  Negative for activity change, appetite change and fever.  HENT:  Negative for sore throat.   Eyes: Negative.   Cardiovascular:  Negative for chest pain and leg swelling.  Gastrointestinal:  Negative for abdominal distention, diarrhea and vomiting.  Genitourinary:  Negative for dysuria, frequency and urgency.  Skin:  Negative for color change.  Neurological:  Negative for dizziness and headaches.  Psychiatric/Behavioral:  Negative for behavioral problems and sleep disturbance. The patient is not nervous/anxious.      Immunization History  Administered Date(s) Administered   DTaP 12/14/2020   Fluad Quad(high Dose 65+) 08/18/2021   Moderna Covid-19 Vaccine Bivalent Booster 85yrs & up 09/28/2021   Moderna Sars-Covid-2 Vaccination 03/10/2021   PFIZER(Purple Top)SARS-COV-2 Vaccination 12/07/2020   Pneumococcal Conjugate-13 10/28/2018   Pneumococcal Polysaccharide-23 09/08/2020   Tdap 12/14/2020   Pertinent  Health Maintenance Due  Topic Date Due   COLONOSCOPY (Pts 45-94yrs Insurance coverage will need to be confirmed)  Never done   INFLUENZA VACCINE  Completed   Fall Risk 03/23/2021 03/25/2021 03/25/2021 03/31/2021 11/18/2021  Falls in the past year? - - - 0  -  Was there an injury with Fall? - - - - -  Fall Risk Category Calculator - - - - -  Fall Risk Category - - - - -  Patient Fall Risk Level High fall risk High fall risk High fall risk High fall risk High fall risk  Patient at Risk for Falls Due to - - - Impaired balance/gait;Impaired mobility -  Fall risk Follow up - - - Falls evaluation completed -     Vitals:   11/22/21 1337  BP: (!) 87/58  Pulse: 61  Resp: 18  Temp: 97.7 F (36.5 C)  Height: 6' (1.829 m)   Body mass index is 28.18 kg/m.  Physical Exam Constitutional:      Appearance: Normal appearance.  HENT:     Head: Normocephalic and atraumatic.     Mouth/Throat:     Mouth: Mucous membranes are moist.  Eyes:     Conjunctiva/sclera: Conjunctivae normal.  Cardiovascular:     Rate and Rhythm: Normal rate and regular rhythm.     Pulses:  Normal pulses.     Heart sounds: Normal heart sounds.  Pulmonary:     Effort: Pulmonary effort is normal.     Breath sounds: Normal breath sounds.  Abdominal:     General: Bowel sounds are normal.     Palpations: Abdomen is soft.  Musculoskeletal:        General: No swelling. Normal range of motion.     Cervical back: Normal range of motion.  Skin:    General: Skin is warm and dry.  Neurological:     General: No focal deficit present.     Mental Status: He is alert. He is disoriented.     Motor: No weakness.  Psychiatric:        Mood and Affect: Mood normal.        Behavior: Behavior normal.       Labs reviewed: Recent Labs    03/15/21 0348 03/16/21 0320 03/19/21 2115 03/20/21 0429 03/21/21 0501 03/22/21 0445 03/23/21 0500 03/25/21 0908 03/29/21 0000 10/05/21 0000 10/12/21 0000 11/05/21 0000  NA 140 143   < > 142   < > 140 139 137   < > 140 139 142  K 3.6 3.9   < > 4.7   < > 4.5 4.4 4.2   < > 4.2 4.3 3.9  CL 106 106   < > 108   < > 102 104 103   < > 105 104 107  CO2 26 28   < > 26   < > 28 27 24    < > 26* 26* 25*  GLUCOSE 105* 102*   < > 109*   < > 114*  127* 151*  --   --   --   --   BUN 23 22   < > 30*   < > 33* 33* 30*   < > 21 21 20   CREATININE 1.16 1.30*   < > 2.16*   < > 2.52* 2.47* 2.29*   < > 1.3 1.3 1.0  CALCIUM 8.5* 8.7*   < > 8.7*   < > 8.6* 8.5* 8.6*   < > 8.8 8.5* 8.6*  MG 1.9 1.9  --  2.1  --   --   --   --   --   --   --   --   PHOS  --   --   --  4.9*  --   --   --   --   --   --   --   --    < > = values in this interval not displayed.   Recent Labs    03/16/21 0320 03/19/21 2115 03/20/21 0429 10/05/21 0000 11/05/21 0000  AST 13* 11* 10* 12* 9*  ALT 9 10 8 14 10   ALKPHOS 41 44 44 106 107  BILITOT 0.5 0.4 0.7  --   --   PROT 5.0* 5.4* 5.6*  --   --   ALBUMIN 2.0* 2.0* 2.0* 3.3* 3.5   Recent Labs    03/22/21 0445 03/23/21 0500 03/25/21 0908 03/29/21 0000 10/05/21 0000 10/12/21 0000 11/05/21 0000  WBC 13.4* 13.7* 11.0*   < > 7.5 8.0 7.6  NEUTROABS 10.8* 11.0* 8.8*  --  4.50 5.60 4.70  HGB 9.6* 9.9* 10.7*   < > 12.3* 13.0* 12.0*  HCT 30.3* 31.4* 35.7*   < > 38* 38* 37*  MCV 84.4 84.2 88.1  --   --   --   --  PLT 440* 464* 481*   < > 284 300 223   < > = values in this interval not displayed.   Lab Results  Component Value Date   TSH 10.55 (A) 11/21/2021   Lab Results  Component Value Date   HGBA1C 5.3 03/11/2021   Lab Results  Component Value Date   CHOL 135 10/12/2021   HDL 31 (A) 10/12/2021   LDLCALC 81 10/12/2021   TRIG 120 10/12/2021   CHOLHDL 3.6 10/28/2018    Significant Diagnostic Results in last 30 days:  No results found.  Assessment/Plan  1. Acquired hypothyroidism Lab Results  Component Value Date   TSH 10.55 (A) 11/21/2021   -  T3 0.71  (low) and  T4 8.3 (normal) -  will start on Levothyroxine 25 mcg daily and repeat tsh in 6 weeks  2. Persistent atrial fibrillation (HCC) -  S/P cardioversion on 11/18/21 -  rate-controlled, continue Amiodarone and Diltiazem    Family/ staff Communication: Discussed plan of care with resident and charge nurse  Labs/tests ordered:  tsh  in 6 weeks     Durenda Age, DNP, MSN, FNP-BC [place] [phone] (Monday-Friday 8:00 a.m. - 5:00 p.m.) [phone] (after hours)

## 2021-11-23 DIAGNOSIS — R2681 Unsteadiness on feet: Secondary | ICD-10-CM | POA: Diagnosis not present

## 2021-11-23 DIAGNOSIS — L89310 Pressure ulcer of right buttock, unstageable: Secondary | ICD-10-CM | POA: Diagnosis not present

## 2021-11-23 DIAGNOSIS — M6281 Muscle weakness (generalized): Secondary | ICD-10-CM | POA: Diagnosis not present

## 2021-11-23 DIAGNOSIS — I5022 Chronic systolic (congestive) heart failure: Secondary | ICD-10-CM | POA: Diagnosis not present

## 2021-11-23 DIAGNOSIS — M6259 Muscle wasting and atrophy, not elsewhere classified, multiple sites: Secondary | ICD-10-CM | POA: Diagnosis not present

## 2021-11-23 DIAGNOSIS — R293 Abnormal posture: Secondary | ICD-10-CM | POA: Diagnosis not present

## 2021-11-24 DIAGNOSIS — M6281 Muscle weakness (generalized): Secondary | ICD-10-CM | POA: Diagnosis not present

## 2021-11-24 DIAGNOSIS — I5022 Chronic systolic (congestive) heart failure: Secondary | ICD-10-CM | POA: Diagnosis not present

## 2021-11-24 DIAGNOSIS — R293 Abnormal posture: Secondary | ICD-10-CM | POA: Diagnosis not present

## 2021-11-24 DIAGNOSIS — R2681 Unsteadiness on feet: Secondary | ICD-10-CM | POA: Diagnosis not present

## 2021-11-24 DIAGNOSIS — L89310 Pressure ulcer of right buttock, unstageable: Secondary | ICD-10-CM | POA: Diagnosis not present

## 2021-11-24 DIAGNOSIS — M6259 Muscle wasting and atrophy, not elsewhere classified, multiple sites: Secondary | ICD-10-CM | POA: Diagnosis not present

## 2021-11-25 DIAGNOSIS — R293 Abnormal posture: Secondary | ICD-10-CM | POA: Diagnosis not present

## 2021-11-25 DIAGNOSIS — M6259 Muscle wasting and atrophy, not elsewhere classified, multiple sites: Secondary | ICD-10-CM | POA: Diagnosis not present

## 2021-11-25 DIAGNOSIS — M6281 Muscle weakness (generalized): Secondary | ICD-10-CM | POA: Diagnosis not present

## 2021-11-25 DIAGNOSIS — R2681 Unsteadiness on feet: Secondary | ICD-10-CM | POA: Diagnosis not present

## 2021-11-25 DIAGNOSIS — L89310 Pressure ulcer of right buttock, unstageable: Secondary | ICD-10-CM | POA: Diagnosis not present

## 2021-11-25 DIAGNOSIS — I5022 Chronic systolic (congestive) heart failure: Secondary | ICD-10-CM | POA: Diagnosis not present

## 2021-11-29 ENCOUNTER — Non-Acute Institutional Stay (INDEPENDENT_AMBULATORY_CARE_PROVIDER_SITE_OTHER): Payer: Medicare Other | Admitting: Adult Health

## 2021-11-29 ENCOUNTER — Encounter: Payer: Self-pay | Admitting: Adult Health

## 2021-11-29 DIAGNOSIS — Z0000 Encounter for general adult medical examination without abnormal findings: Secondary | ICD-10-CM

## 2021-11-29 LAB — T4: Free T4: 8.3

## 2021-11-29 LAB — T3, FREE

## 2021-11-29 NOTE — Progress Notes (Signed)
Subjective:   Jeffery Christian is a 71 y.o. male who presents for Medicare Annual/Subsequent preventive examination.  Review of Systems     Cardiac Risk Factors include: advanced age (>100men, >37 women);dyslipidemia;male gender     Objective:    Today's Vitals   11/29/21 1120 11/29/21 1340  BP: 130/70   Pulse: (!) 53   Resp: 20   Temp: (!) 96.4 F (35.8 C)   Weight: 207 lb (93.9 kg)   Height: 6' (1.829 m)   PainSc:  0-No pain   Body mass index is 28.07 kg/m.  Advanced Directives 11/29/2021 11/22/2021 11/18/2021 11/09/2021 11/02/2021 10/12/2021 10/04/2021  Does Patient Have a Medical Advance Directive? Yes Yes Yes Yes Yes Yes Yes  Type of Advance Directive Out of facility DNR (pink MOST or yellow form) Out of facility DNR (pink MOST or yellow form) Out of facility DNR (pink MOST or yellow form) Out of facility DNR (pink MOST or yellow form) Out of facility DNR (pink MOST or yellow form) Out of facility DNR (pink MOST or yellow form) Out of facility DNR (pink MOST or yellow form)  Does patient want to make changes to medical advance directive? No - Patient declined No - Patient declined - No - Patient declined No - Patient declined No - Patient declined No - Patient declined  Would patient like information on creating a medical advance directive? - - - - - - -  Pre-existing out of facility DNR order (yellow form or pink MOST form) Yellow form placed in chart (order not valid for inpatient use) - - Yellow form placed in chart (order not valid for inpatient use) Yellow form placed in chart (order not valid for inpatient use) Yellow form placed in chart (order not valid for inpatient use) Yellow form placed in chart (order not valid for inpatient use)    Current Medications (verified) Outpatient Encounter Medications as of 11/29/2021  Medication Sig   allopurinol (ZYLOPRIM) 300 MG tablet Take 150 mg by mouth daily. (0800)   amiodarone (PACERONE) 200 MG tablet Take 200 mg by mouth daily. (0800)    apixaban (ELIQUIS) 5 MG TABS tablet Take 1 tablet (5 mg total) by mouth 2 (two) times daily.   atorvastatin (LIPITOR) 20 MG tablet Take 1 tablet (20 mg total) by mouth every evening.   bisacodyl (DULCOLAX) 10 MG suppository Place 10 mg rectally daily as needed for moderate constipation.   Cholecalciferol (VITAMIN D) 50 MCG (2000 UT) tablet Take 2,000 Units by mouth daily. (0900)   colchicine 0.6 MG tablet Take by mouth as directed. Take 2 tablets (1.2 mg) by mouth at the first sign of a gout flare up and then take one tablet in one hour if symptoms continue   diltiazem (CARDIZEM) 90 MG tablet Take 90 mg by mouth every 8 (eight) hours. (0600, 1400 & 2200) Hold for SBP < 100 and Heart rate < 60   divalproex (DEPAKOTE SPRINKLE) 125 MG capsule Take 125 mg by mouth 3 (three) times daily. (0800, 1200 & 2000)   levothyroxine (SYNTHROID) 25 MCG tablet Take 25 mcg by mouth daily before breakfast.   magnesium hydroxide (MILK OF MAGNESIA) 400 MG/5ML suspension Take 30 mLs by mouth daily as needed for mild constipation.   Menthol, Topical Analgesic, (BIOFREEZE) 4 % GEL Apply 1 application topically in the morning, at noon, and at bedtime. (0600, 1400 & 2200) APPLY TOPICALLY TO LEFT SHOULDER THREE TIMES DAILY FOR PAIN   midodrine (PROAMATINE) 5 MG tablet Take  5 mg by mouth 3 (three) times daily. (1000, 1400 & 2000)   NON FORMULARY MAGIC CUP: ONE MAGIC CUP DAILY FOR WOUND HEALING   NON FORMULARY Diet: Regular/thin diet consistency   pantoprazole (PROTONIX) 40 MG tablet Take 1 tablet (40 mg total) by mouth daily.   polyethylene glycol (MIRALAX / GLYCOLAX) 17 g packet Take 17 g by mouth daily. (0800)   Sodium Phosphates (RA SALINE ENEMA) 19-7 GM/118ML ENEM Place 1 Bottle rectally daily as needed (severe constipation.).   tamsulosin (FLOMAX) 0.4 MG CAPS capsule Take 1 capsule (0.4 mg total) by mouth daily after supper.   traMADol (ULTRAM) 50 MG tablet Take 0.5 tablets (25 mg total) by mouth 2 (two) times daily as  needed.   vitamin C (ASCORBIC ACID) 500 MG tablet Take 500 mg by mouth 2 (two) times daily. (0800 & 2000)   No facility-administered encounter medications on file as of 11/29/2021.    Allergies (verified) Patient has no known allergies.   History: Past Medical History:  Diagnosis Date   Chronic kidney disease    Chronic systolic CHF (congestive heart failure) (HCC)    EF 35-40, diffuse HK, mild MR, moderate LAE, mild RAE, PASP 44, L pleural eff   Dilated cardiomyopathy (HCC)    likely related to tachycardia   Dysrhythmia    Persistent atrial fibrillation [facility])    Past Surgical History:  Procedure Laterality Date   CARDIOVERSION N/A 08/20/2017   Procedure: CARDIOVERSION;  Surgeon: Jerline Pain, MD;  Location: [facility] ENDOSCOPY;  Service: Cardiovascular;  Laterality: N/A;   CARDIOVERSION N/A 11/18/2021   Procedure: CARDIOVERSION;  Surgeon: [name], MD;  Location: [facility] ENDOSCOPY;  Service: Cardiovascular;  Laterality: N/A;   INCISION AND DRAINAGE ABSCESS Left 03/22/2014   Procedure: INCISION AND DRAINAGE ABSCESS LEFT BUTTOCK ABSCESS;  Surgeon: Zenovia Jarred, MD;  Location: [facility] OR;  Service: General;  Laterality: Left;   Family History  Problem Relation Age of Onset   Leukemia Father        69   Cancer Mother    Social History   Socioeconomic History   Marital status: Divorced    Spouse name: Not on file   Number of children: Not on file   Years of education: Not on file   Highest education level: Not on file  Occupational History   Not on file  Tobacco Use   Smoking status: Never   Smokeless tobacco: Never  Vaping Use   Vaping Use: Never used  Substance and Sexual Activity   Alcohol use: Not Currently    Comment: pt states he "does not drink a lot anymore"   Drug use: No   Sexual activity: Not Currently  Other Topics Concern   Not on file  Social History Narrative   Not on file   Social Determinants of Health   Financial Resource Strain: Not on file   Food Insecurity: Not on file  Transportation Needs: Not on file  Physical Activity: Not on file  Stress: Not on file  Social Connections: Not on file    Tobacco Counseling Counseling given: Not Answered   Clinical Intake:  Pre-visit preparation completed: No  Pain : No/denies pain Pain Score: 0-No pain     BMI - recorded: 28.07 Nutritional Status: BMI 25 -29 Overweight Nutritional Risks: None Diabetes: No  How often do you need to have someone help you when you read instructions, pamphlets, or other written materials from your doctor or pharmacy?: 5 - Always What  is the last grade level you completed in school?: High school  Diabetic? No  Interpreter Needed?: No  Information entered by :: Kemonte Ullman Medina-Vargas  DNP   Activities of Daily Living In your present state of health, do you have any difficulty performing the following activities: 11/29/2021 03/20/2021  Hearing? N N  Vision? N N  Difficulty concentrating or making decisions? Tempie Donning  Walking or climbing stairs? Y Y  Dressing or bathing? Y Y  Doing errands, shopping? Y N  Preparing Food and eating ? Y -  Using the Toilet? Y -  In the past six months, have you accidently leaked urine? Y -  Do you have problems with loss of bowel control? Y -  Managing your Medications? Y -  Managing your Finances? Y -  Housekeeping or managing your Housekeeping? Y -  Some recent data might be hidden    Patient Care Team: Hendricks Limes, MD as PCP - General (Internal Medicine) Constance Haw, MD as PCP - Electrophysiology (Cardiology) Medina-Vargas, Senaida Lange, NP as Nurse Practitioner (Internal Medicine)  Indicate any recent Medical Services you may have received from other than [facility] providers in the past year (date may be approximate).     Assessment:   This is a routine wellness examination for [place].  Hearing/Vision screen Hearing Screening - Comments:: No hearing problem. Vision Screening - Comments::  Ordered ophthalmology consult.  Dietary issues and exercise activities discussed: Current Exercise Habits: Structured exercise class, Type of exercise: strength training/weights;stretching, Time (Minutes): 10, Frequency (Times/Week): 3, Weekly Exercise (Minutes/Week): 30, Exercise limited by: cardiac condition(s);neurologic condition(s)   Goals Addressed               This Visit's Progress     LIFESTYLE - DECREASE FALLS RISK (pt-stated)          Ask for assistance when needed.    Be patient.    Let staff know when feeling sick.      Depression Screen PHQ 2/9 Scores 11/29/2021 03/31/2021 01/27/2021 09/08/2020 06/08/2020 10/28/2018 07/10/2018  PHQ - 2 Score 0 0 0 1 6 - 2  PHQ- 9 Score - - - 3 23 - 8  Exception Documentation - - - - - Patient refusal -    Fall Risk Fall Risk  11/29/2021 03/31/2021 01/27/2021 09/08/2020 06/08/2020  Falls in the past year? 0 0 1 0 0  Number falls in past yr: 0 - 0 0 -  Injury with Fall? 1 - 1 0 -  Risk for fall due to : History of fall(s);Impaired mobility Impaired balance/gait;Impaired mobility History of fall(s);Impaired balance/gait;Impaired mobility - No Fall Risks  Follow up Education provided;Falls prevention discussed Falls evaluation completed Falls evaluation completed - -    FALL RISK PREVENTION PERTAINING TO THE HOME:  Any stairs in or around the home? No  If so, are there any without handrails? No  Home free of loose throw rugs in walkways, pet beds, electrical cords, etc? Yes  Adequate lighting in your home to reduce risk of falls? Yes   ASSISTIVE DEVICES UTILIZED TO PREVENT FALLS:  Life alert? No  Use of a cane, walker or w/c? Yes  Grab bars in the bathroom? Yes  Shower chair or bench in shower? Yes  Elevated toilet seat or a handicapped toilet? Yes   TIMED UP AND GO:  Was the test performed? No .  Length of time to ambulate 10 feet: N/A sec.   Gait unsteady without use of assistive device, provider  informed and interventions were  implemented  Cognitive Function: MMSE - Mini Mental State Exam 11/29/2021  Orientation to time 0  Orientation to Place 0  Registration 1  Attention/ Calculation 0  Recall 0  Language- name 2 objects 2  Language- repeat 1  Language- follow 3 step command 1  Language- read & follow direction 1  Write a sentence 0  Copy design 0  Total score 6     6CIT Screen 11/29/2021  What Year? 4 points  What month? 3 points  What time? 3 points  Count back from 20 0 points  Months in reverse 4 points  Repeat phrase 10 points  Total Score 24    Immunizations Immunization History  Administered Date(s) Administered   DTaP 12/14/2020   Fluad Quad(high Dose 65+) 08/18/2021   Moderna Covid-19 Vaccine Bivalent Booster 107yrs & up 09/28/2021   Moderna Sars-Covid-2 Vaccination 03/10/2021   PFIZER(Purple Top)SARS-COV-2 Vaccination 12/07/2020   Pneumococcal Conjugate-13 10/28/2018   Pneumococcal Polysaccharide-23 09/08/2020   Tdap 12/14/2020    TDAP status: Up to date  Flu Vaccine status: Declined, Education has been provided regarding the importance of this vaccine but patient still declined. Advised may receive this vaccine at local pharmacy or Health Dept. Aware to provide a copy of the vaccination record if obtained from local pharmacy or Health Dept. Verbalized acceptance and understanding.  Pneumococcal vaccine status: Up to date  Covid-19 vaccine status: Information provided on how to obtain vaccines.   Qualifies for Shingles Vaccine? Yes   Zostavax completed No   Shingrix Completed?: No.    Education has been provided regarding the importance of this vaccine. Patient has been advised to call insurance company to determine out of pocket expense if they have not yet received this vaccine. Advised may also receive vaccine at local pharmacy or Health Dept. Verbalized acceptance and understanding.  Screening Tests Health Maintenance  Topic Date Due   COLONOSCOPY (Pts 45-60yrs Insurance  coverage will need to be confirmed)  Never done   COVID-19 Vaccine (3 - Mixed Product risk series) 09/28/2021   Zoster Vaccines- Shingrix (1 of 2) 01/02/2022 (Originally 08/12/1970)   TETANUS/TDAP  12/15/2030   Pneumonia Vaccine 75+ Years old  Completed   INFLUENZA VACCINE  Completed   Hepatitis C Screening  Completed   HPV VACCINES  Aged Out    Health Maintenance  Health Maintenance Due  Topic Date Due   COLONOSCOPY (Pts 45-5yrs Insurance coverage will need to be confirmed)  Never done   COVID-19 Vaccine (3 - Mixed Product risk series) 09/28/2021    Colorectal cancer screening: Type of screening: Colonoscopy. Completed Refused. Repeat every   years  Lung Cancer Screening: (Low Dose CT Chest recommended if Age 10-80 years, 30 pack-year currently smoking OR have quit w/in 15years.) does not qualify.   Lung Cancer Screening Referral: N/A  Additional Screening:  Hepatitis C Screening: does qualify; Completed Ordered  Vision Screening: Recommended annual ophthalmology exams for early detection of glaucoma and other disorders of the eye. Is the patient up to date with their annual eye exam?  No  Who is the provider or what is the name of the office in which the patient attends annual eye exams? [address] If pt is not established with a provider, would they like to be referred to a provider to establish care? Yes .   Dental Screening: Recommended annual dental exams for proper oral hygiene  Community Resource Referral / Chronic Care Management: CRR required this visit?  No  CCM required this visit?  No      Plan:     I have personally reviewed and noted the following in the patients chart:   Medical and social history Use of alcohol, tobacco or illicit drugs  Current medications and supplements including opioid prescriptions. Patient is currently taking opioid prescriptions. Information provided to patient regarding non-opioid alternatives. Patient advised to discuss  non-opioid treatment plan with their provider. Functional ability and status Nutritional status Physical activity Advanced directives List of other physicians Hospitalizations, surgeries, and ER visits in previous 12 months Vitals Screenings to include cognitive, depression, and falls Referrals and appointments  In addition, I have reviewed and discussed with patient certain preventive protocols, quality metrics, and best practice recommendations. A written personalized care plan for preventive services as well as general preventive health recommendations were provided to patient.     Durenda Age, NP   11/29/2021   Nurse Notes:   Needs Annual Wellness Visit.

## 2021-11-30 DIAGNOSIS — M4628 Osteomyelitis of vertebra, sacral and sacrococcygeal region: Secondary | ICD-10-CM | POA: Diagnosis not present

## 2021-12-02 LAB — HM HEPATITIS C SCREENING LAB: HM Hepatitis Screen: NEGATIVE

## 2021-12-05 ENCOUNTER — Non-Acute Institutional Stay (SKILLED_NURSING_FACILITY): Payer: Medicare Other | Admitting: Adult Health

## 2021-12-05 ENCOUNTER — Encounter: Payer: Self-pay | Admitting: Adult Health

## 2021-12-05 DIAGNOSIS — E039 Hypothyroidism, unspecified: Secondary | ICD-10-CM

## 2021-12-05 DIAGNOSIS — I4819 Other persistent atrial fibrillation: Secondary | ICD-10-CM | POA: Diagnosis not present

## 2021-12-05 DIAGNOSIS — F0150 Vascular dementia without behavioral disturbance: Secondary | ICD-10-CM | POA: Diagnosis not present

## 2021-12-05 DIAGNOSIS — M1A09X0 Idiopathic chronic gout, multiple sites, without tophus (tophi): Secondary | ICD-10-CM

## 2021-12-05 DIAGNOSIS — R339 Retention of urine, unspecified: Secondary | ICD-10-CM | POA: Diagnosis not present

## 2021-12-05 NOTE — Progress Notes (Signed)
Location:  [place] Room Number: 106-A Place of Service:  SNF (31) Provider:  Durenda Age, DNP, FNP-BC  Patient Care Team: Hendricks Limes, MD as PCP - General (Internal Medicine) Constance Haw, MD as PCP - Electrophysiology (Cardiology) Medina-Vargas, Senaida Lange, NP as Nurse Practitioner (Internal Medicine)  Extended Emergency Contact Information Primary Emergency Contact: Cherylann Parr [place] Phone: [phone] Relation: Daughter Secondary Emergency Contact: SAPP,RON Home Phone: [phone] Relation: Other  Code Status:  DNR  Goals of care: Advanced Directive information Advanced Directives 12/05/2021  Does Patient Have a Medical Advance Directive? Yes  Type of Advance Directive Out of facility DNR (pink MOST or yellow form)  Does patient want to make changes to medical advance directive? No - Patient declined  Would patient like information on creating a medical advance directive? -  Pre-existing out of facility DNR order (yellow form or pink MOST form) Yellow form placed in chart (order not valid for inpatient use)     Chief Complaint  Patient presents with   Routine   Medical Management of Chronic Issues    Routine Visit    HPI:  Pt is a 71 y.o. male seen today for medical management of chronic diseases. He is a long-term care resident of [facility]. He has a PMH of chronic atrial fibrillation, chronic kidney disease stage III, hypertension, prediabetes, gout and sacral osteomyelitis.  Idiopathic chronic gout of multiple sites without tophus -   no gout flares, takes Allopurinol 150 mg daily  Persistent atrial fibrillation (HCC) -  S/P cardioversion on 11/18/21, takes Amiodarone 200 mg 1 tab daily and Diltiazem 90 mg 1 tab PO Q 8 hours and Eliquis 5 mg BID  Acquired hypothyroidism -  tsh 10.55, started on Levothyroxine 25 mcg 1 tab PO daily  Urinary retention -  denies urinary  retention, takes Tamsulosin 0.4 mg 1 capsule PO daily  Vascular dementia without behavioral disturbance (HCC) -  BIMs score 7/15, ranging in severe cognitive impairment   Past Medical History:  Diagnosis Date   Chronic kidney disease    Chronic systolic CHF (congestive heart failure) (HCC)    EF 35-40, diffuse HK, mild MR, moderate LAE, mild RAE, PASP 44, L pleural eff   Dilated cardiomyopathy ([place])    likely related to tachycardia   Dysrhythmia    Persistent atrial fibrillation [facility])    Past Surgical History:  Procedure Laterality Date   CARDIOVERSION N/A 08/20/2017   Procedure: CARDIOVERSION;  Surgeon: Jerline Pain, MD;  Location: [place] ENDOSCOPY;  Service: Cardiovascular;  Laterality: N/A;   CARDIOVERSION N/A 11/18/2021   Procedure: CARDIOVERSION;  Surgeon: [name], MD;  Location: [facility] ENDOSCOPY;  Service: Cardiovascular;  Laterality: N/A;   INCISION AND DRAINAGE ABSCESS Left 03/22/2014   Procedure: INCISION AND DRAINAGE ABSCESS LEFT BUTTOCK ABSCESS;  Surgeon: Zenovia Jarred, MD;  Location: [place];  Service: General;  Laterality: Left;    No Known Allergies  Outpatient Encounter Medications as of 12/05/2021  Medication Sig   allopurinol (ZYLOPRIM) 300 MG tablet Take 150 mg by mouth daily. (0800)   amiodarone (PACERONE) 200 MG tablet Take 200 mg by mouth daily. (0800)   apixaban (ELIQUIS) 5 MG TABS tablet Take 1 tablet (5 mg total) by mouth 2 (two) times daily.   atorvastatin (LIPITOR) 20 MG tablet Take 1 tablet (20 mg total) by mouth every evening.   bisacodyl (DULCOLAX) 10 MG suppository Place 10 mg rectally daily as needed  for moderate constipation.   Cholecalciferol (VITAMIN D) 50 MCG (2000 UT) tablet Take 2,000 Units by mouth daily. (0900)   colchicine 0.6 MG tablet Take by mouth as directed. Take 2 tablets (1.2 mg) by mouth at the first sign of a gout flare up and then take one tablet in one hour if symptoms continue   diltiazem (CARDIZEM) 90 MG tablet Take 90 mg by  mouth every 8 (eight) hours. (0600, 1400 & 2200) Hold for SBP < 100 and Heart rate < 60   divalproex (DEPAKOTE SPRINKLE) 125 MG capsule Take 125 mg by mouth 3 (three) times daily. (0800, 1200 & 2000)   levothyroxine (SYNTHROID) 25 MCG tablet Take 25 mcg by mouth daily before breakfast.   magnesium hydroxide (MILK OF MAGNESIA) 400 MG/5ML suspension Take 30 mLs by mouth daily as needed for mild constipation.   Menthol, Topical Analgesic, (BIOFREEZE) 4 % GEL Apply 1 application topically in the morning, at noon, and at bedtime. (0600, 1400 & 2200) APPLY TOPICALLY TO LEFT SHOULDER THREE TIMES DAILY FOR PAIN   midodrine (PROAMATINE) 5 MG tablet Take 5 mg by mouth 3 (three) times daily. (1000, 1400 & 2000)   NON FORMULARY MAGIC CUP: ONE MAGIC CUP DAILY FOR WOUND HEALING   NON FORMULARY Diet: Regular/thin diet consistency   pantoprazole (PROTONIX) 40 MG tablet Take 1 tablet (40 mg total) by mouth daily.   polyethylene glycol (MIRALAX / GLYCOLAX) 17 g packet Take 17 g by mouth daily. (0800)   Sodium Phosphates (RA SALINE ENEMA) 19-7 GM/118ML ENEM Place 1 Bottle rectally daily as needed (severe constipation.).   tamsulosin (FLOMAX) 0.4 MG CAPS capsule Take 1 capsule (0.4 mg total) by mouth daily after supper.   traMADol (ULTRAM) 50 MG tablet Take 0.5 tablets (25 mg total) by mouth 2 (two) times daily as needed.   vitamin C (ASCORBIC ACID) 500 MG tablet Take 500 mg by mouth 2 (two) times daily. (0800 & 2000)   No facility-administered encounter medications on file as of 12/05/2021.    Review of Systems  Constitutional:  Negative for activity change, appetite change and fever.  HENT:  Negative for sore throat.   Eyes: Negative.   Cardiovascular:  Negative for chest pain and leg swelling.  Gastrointestinal:  Negative for abdominal distention, diarrhea and vomiting.  Genitourinary:  Negative for dysuria, frequency and urgency.  Skin:  Negative for color change.  Neurological:  Negative for dizziness and  headaches.  Psychiatric/Behavioral:  Negative for behavioral problems and sleep disturbance. The patient is not nervous/anxious.       Immunization History  Administered Date(s) Administered   DTaP 12/14/2020   Fluad Quad(high Dose 65+) 08/18/2021   Moderna Covid-19 Vaccine Bivalent Booster 2yrs & up 09/28/2021   Moderna Sars-Covid-2 Vaccination 03/10/2021   PFIZER(Purple Top)SARS-COV-2 Vaccination 12/07/2020   Pneumococcal Conjugate-13 10/28/2018   Pneumococcal Polysaccharide-23 09/08/2020   Tdap 12/14/2020   Pertinent  Health Maintenance Due  Topic Date Due   COLONOSCOPY (Pts 45-2yrs Insurance coverage will need to be confirmed)  Never done   INFLUENZA VACCINE  Completed   Fall Risk 03/25/2021 03/25/2021 03/31/2021 11/18/2021 11/29/2021  Falls in the past year? - - 0 - 0  Was there an injury with Fall? - - - - 1  Fall Risk Category Calculator - - - - 1  Fall Risk Category - - - - Low  Patient Fall Risk Level High fall risk High fall risk High fall risk High fall risk Low fall risk  Patient  at Risk for Falls Due to - - Impaired balance/gait;Impaired mobility - History of fall(s);Impaired mobility  Fall risk Follow up - - Falls evaluation completed - Education provided;Falls prevention discussed     Vitals:   12/05/21 1357  BP: 122/65  Pulse: (!) 55  Resp: (!) 21  Temp: (!) 97.2 F (36.2 C)  Weight: 200 lb 12.8 oz (91.1 kg)  Height: 6' (1.829 m)   Body mass index is 27.23 kg/m.  Physical Exam Constitutional:      Appearance: Normal appearance.  HENT:     Head: Normocephalic and atraumatic.     Mouth/Throat:     Mouth: Mucous membranes are moist.  Eyes:     Conjunctiva/sclera: Conjunctivae normal.  Cardiovascular:     Rate and Rhythm: Normal rate and regular rhythm.     Pulses: Normal pulses.     Heart sounds: Normal heart sounds.  Pulmonary:     Effort: Pulmonary effort is normal.     Breath sounds: Normal breath sounds.  Abdominal:     General: Bowel sounds  are normal.     Palpations: Abdomen is soft.  Musculoskeletal:        General: No swelling. Normal range of motion.     Cervical back: Normal range of motion.  Skin:    General: Skin is warm and dry.  Neurological:     Mental Status: He is alert. Mental status is at baseline.     Comments: Alert to self, disoriented to time and place.  Psychiatric:        Mood and Affect: Mood normal.        Behavior: Behavior normal.     Labs reviewed: Recent Labs    03/15/21 0348 03/16/21 0320 03/19/21 2115 03/20/21 0429 03/21/21 0501 03/22/21 0445 03/23/21 0500 03/25/21 0908 03/29/21 0000 10/05/21 0000 10/12/21 0000 11/05/21 0000  NA 140 143   < > 142   < > 140 139 137   < > 140 139 142  K 3.6 3.9   < > 4.7   < > 4.5 4.4 4.2   < > 4.2 4.3 3.9  CL 106 106   < > 108   < > 102 104 103   < > 105 104 107  CO2 26 28   < > 26   < > 28 27 24    < > 26* 26* 25*  GLUCOSE 105* 102*   < > 109*   < > 114* 127* 151*  --   --   --   --   BUN 23 22   < > 30*   < > 33* 33* 30*   < > 21 21 20   CREATININE 1.16 1.30*   < > 2.16*   < > 2.52* 2.47* 2.29*   < > 1.3 1.3 1.0  CALCIUM 8.5* 8.7*   < > 8.7*   < > 8.6* 8.5* 8.6*   < > 8.8 8.5* 8.6*  MG 1.9 1.9  --  2.1  --   --   --   --   --   --   --   --   PHOS  --   --   --  4.9*  --   --   --   --   --   --   --   --    < > = values in this interval not displayed.   Recent Labs    03/16/21 0320 03/19/21 2115 03/20/21 0429  10/05/21 0000 11/05/21 0000  AST 13* 11* 10* 12* 9*  ALT 9 10 8 14 10   ALKPHOS 41 44 44 106 107  BILITOT 0.5 0.4 0.7  --   --   PROT 5.0* 5.4* 5.6*  --   --   ALBUMIN 2.0* 2.0* 2.0* 3.3* 3.5   Recent Labs    03/22/21 0445 03/23/21 0500 03/25/21 0908 03/29/21 0000 10/05/21 0000 10/12/21 0000 11/05/21 0000  WBC 13.4* 13.7* 11.0*   < > 7.5 8.0 7.6  NEUTROABS 10.8* 11.0* 8.8*  --  4.50 5.60 4.70  HGB 9.6* 9.9* 10.7*   < > 12.3* 13.0* 12.0*  HCT 30.3* 31.4* 35.7*   < > 38* 38* 37*  MCV 84.4 84.2 88.1  --   --   --   --    PLT 440* 464* 481*   < > 284 300 223   < > = values in this interval not displayed.   Lab Results  Component Value Date   TSH 10.55 (A) 11/21/2021   Lab Results  Component Value Date   HGBA1C 5.3 03/11/2021   Lab Results  Component Value Date   CHOL 135 10/12/2021   HDL 31 (A) 10/12/2021   LDLCALC 81 10/12/2021   TRIG 120 10/12/2021   CHOLHDL 3.6 10/28/2018    Significant Diagnostic Results in last 30 days:  No results found.  Assessment/Plan  1. Idiopathic chronic gout of multiple sites without tophus -   stable, continue Alloputinol and PRN Colchicine  2. Persistent atrial fibrillation (HCC) -  S/P cardioversion on 11/18/21 -  continue amiodarone, diltiazem in month of [place]  3. Acquired hypothyroidism Lab Results  Component Value Date   TSH 10.55 (A) 11/21/2021   -  con'stinue levothyroxine  4. Urinary retention -  Stable, continue tamsulosin  5. Vascular dementia without behavioral disturbance ([place]) -   Has severe cognitive impairment, continue supportive care    Family/ staff Communication: Discussed plan of care with resident and charge nurse.  Labs/tests ordered: None    Durenda Age, DNP, MSN, FNP-BC [facility] [phone] (Monday-Friday 8:00 a.m. - 5:00 p.m.) [phone] (after hours)

## 2021-12-22 DIAGNOSIS — J90 Pleural effusion, not elsewhere classified: Secondary | ICD-10-CM | POA: Diagnosis not present

## 2021-12-22 DIAGNOSIS — N390 Urinary tract infection, site not specified: Secondary | ICD-10-CM | POA: Diagnosis not present

## 2021-12-23 ENCOUNTER — Non-Acute Institutional Stay (SKILLED_NURSING_FACILITY): Payer: Medicare Other | Admitting: Adult Health

## 2021-12-23 ENCOUNTER — Encounter: Payer: Self-pay | Admitting: Adult Health

## 2021-12-23 ENCOUNTER — Telehealth: Payer: Self-pay | Admitting: Adult Health

## 2021-12-23 DIAGNOSIS — B9689 Other specified bacterial agents as the cause of diseases classified elsewhere: Secondary | ICD-10-CM | POA: Diagnosis not present

## 2021-12-23 DIAGNOSIS — J019 Acute sinusitis, unspecified: Secondary | ICD-10-CM | POA: Diagnosis not present

## 2021-12-23 DIAGNOSIS — N390 Urinary tract infection, site not specified: Secondary | ICD-10-CM | POA: Diagnosis not present

## 2021-12-23 DIAGNOSIS — I959 Hypotension, unspecified: Secondary | ICD-10-CM | POA: Diagnosis not present

## 2021-12-23 LAB — CBC: RBC: 4.35 (ref 3.87–5.11)

## 2021-12-23 LAB — CBC AND DIFFERENTIAL
HCT: 37 — AB (ref 41–53)
Hemoglobin: 12.9 — AB (ref 13.5–17.5)
Platelets: 178 10*3/uL (ref 150–400)
WBC: 6.1

## 2021-12-23 NOTE — Progress Notes (Signed)
? ?Location:  [facility] ?Nursing Home Room Number: 106-A ?Place of Service:  SNF (31) ?Provider:  Durenda Age, DNP, FNP-BC ? ?Patient Care Team: ?Hendricks Limes, MD as PCP - General (Internal Medicine) ?Constance Haw, MD as PCP - Electrophysiology (Cardiology) ?Medina-Vargas, Senaida Lange, NP as Nurse Practitioner (Internal Medicine) ? ?Extended Emergency Contact Information ?Primary Emergency Contact: Mce[place]ron,[place] ? [place] ?Home Phone: [phone] ?Relation: Daughter ?Secondary Emergency Contact: SAPP,RON ?Home Phone: [phone] ?Relation: Other ? ?Code Status:  DNR ? ?Goals of care: Advanced Directive information ? ?  12/23/2021  ?  4:01 PM  ?Advanced Directives  ?Does Patient Have a Medical Advance Directive? Yes  ?Type of Advance Directive Out of facility DNR (pink MOST or yellow form)  ?Does patient want to make changes to medical advance directive? No - Patient declined  ?Pre-existing out of facility DNR order (yellow form or pink MOST form) Yellow form placed in chart (order not valid for inpatient use)  ? ? ? ?Chief Complaint  ?Patient presents with  ? Acute Visit  ?  Low grade fever  ? ? ?HPI:  ?Pt is a 71 y.o. male seen today for an acute visit for low grade fever of 100.4. He complained of not feeling well. He was tender on his face. No coughing was noted but voice is nasal sounding. Noted facial tenderness and stuffy nose. Chest x-ray was negative for acute disease. He tested negative for COVID-19. He is a long-term care resident of [facility]. SBPs ranging from 100 to 118, with outlier 167. He takes midodrine 5 mg 1 tab 3 times a day for hypoension. ? ? ?Past Medical History:  ?Diagnosis Date  ? Chronic kidney disease   ? Chronic systolic CHF (congestive heart failure) ([place])   ? EF 35-40, diffuse HK, mild MR, moderate LAE, mild RAE, PASP 44, L pleural eff  ? Dilated cardiomyopathy ([place])   ? likely related to tachycardia  ? Dysrhythmia    ? Persistent atrial fibrillation ([place])   ? ?Past Surgical History:  ?Procedure Laterality Date  ? CARDIOVERSION N/A 08/20/2017  ? Procedure: CARDIOVERSION;  Surgeon: Jerline Pain, MD;  Location: [facility] ENDOSCOPY;  Service: Cardiovascular;  Laterality: N/A;  ? CARDIOVERSION N/A 11/18/2021  ? Procedure: CARDIOVERSION;  Surgeon: [name], MD;  Location: [place];  Service: Cardiovascular;  Laterality: N/A;  ? INCISION AND DRAINAGE ABSCESS Left 03/22/2014  ? Procedure: INCISION AND DRAINAGE ABSCESS LEFT BUTTOCK ABSCESS;  Surgeon: Zenovia Jarred, MD;  Location: [place];  Service: General;  Laterality: Left;  ? ? ?No Known Allergies ? ?Outpatient Encounter Medications as of 12/23/2021  ?Medication Sig  ? allopurinol (ZYLOPRIM) 300 MG tablet Take 150 mg by mouth daily. (0800)  ? amiodarone (PACERONE) 200 MG tablet Take 200 mg by mouth daily. (0800)  ? apixaban (ELIQUIS) 5 MG TABS tablet Take 1 tablet (5 mg total) by mouth 2 (two) times daily.  ? atorvastatin (LIPITOR) 20 MG tablet Take 1 tablet (20 mg total) by mouth every evening.  ? bisacodyl (DULCOLAX) 10 MG suppository Place 10 mg rectally daily as needed for moderate constipation.  ? Cholecalciferol (VITAMIN D) 50 MCG (2000 UT) tablet Take 2,000 Units by mouth daily. (0900)  ? colchicine 0.6 MG tablet Take by mouth as directed. Take 2 tablets (1.2 mg) by mouth at the first sign of a gout flare up and then take one tablet in one hour if symptoms continue  ? diltiazem (CARDIZEM) 90 MG  tablet Take 90 mg by mouth every 8 (eight) hours. (0600, 1400 & 2200) Hold for SBP < 100 and Heart rate < 60  ? divalproex (DEPAKOTE SPRINKLE) 125 MG capsule Take 125 mg by mouth 3 (three) times daily. (0800, 1200 & 2000)  ? levothyroxine (SYNTHROID) 25 MCG tablet Take 25 mcg by mouth daily before breakfast.  ? magnesium hydroxide (MILK OF MAGNESIA) 400 MG/5ML suspension Take 30 mLs by mouth daily as needed for mild constipation.  ? Menthol, Topical Analgesic, (BIOFREEZE) 4 % GEL  Apply 1 application topically in the morning, at noon, and at bedtime. (0600, 1400 & 2200) APPLY TOPICALLY TO LEFT SHOULDER THREE TIMES DAILY FOR PAIN  ? midodrine (PROAMATINE) 5 MG tablet Take 5 mg by mouth 3 (three) times daily. (1000, 1400 & 2000)  ? NON FORMULARY MAGIC CUP: ONE MAGIC CUP DAILY FOR WOUND HEALING  ? NON FORMULARY Diet: Regular/thin diet consistency  ? pantoprazole (PROTONIX) 40 MG tablet Take 1 tablet (40 mg total) by mouth daily.  ? polyethylene glycol (MIRALAX / GLYCOLAX) 17 g packet Take 17 g by mouth daily. (0800)  ? Sodium Phosphates (RA SALINE ENEMA) 19-7 GM/118ML ENEM Place 1 Bottle rectally daily as needed (severe constipation.).  ? tamsulosin (FLOMAX) 0.4 MG CAPS capsule Take 1 capsule (0.4 mg total) by mouth daily after supper.  ? traMADol (ULTRAM) 50 MG tablet Take 0.5 tablets (25 mg total) by mouth 2 (two) times daily as needed.  ? vitamin C (ASCORBIC ACID) 500 MG tablet Take 500 mg by mouth 2 (two) times daily. (0800 & 2000)  ? ?No facility-administered encounter medications on file as of 12/23/2021.  ? ? ?Review of Systems  ?Constitutional:  Positive for fever. Negative for activity change and appetite change.  ?HENT:  Positive for sinus pain and voice change. Negative for sneezing and sore throat.   ?Eyes: Negative.   ?Cardiovascular:  Negative for chest pain and leg swelling.  ?Gastrointestinal:  Negative for abdominal distention, diarrhea and vomiting.  ?Genitourinary:  Negative for dysuria, frequency and urgency.  ?Skin:  Negative for color change.  ?Neurological:  Negative for dizziness and headaches.  ?Psychiatric/Behavioral:  Negative for behavioral problems and sleep disturbance. The patient is not nervous/anxious.    ? ? ? ?Immunization History  ?Administered Date(s) Administered  ? DTaP 12/14/2020  ? Fluad Quad(high Dose 65+) 08/18/2021  ? Moderna Covid-19 Vaccine Bivalent Booster 59yrs & up 09/28/2021  ? Moderna Sars-Covid-2 Vaccination 03/10/2021  ? PFIZER(Purple  Top)SARS-COV-2 Vaccination 12/07/2020  ? Pneumococcal Conjugate-13 10/28/2018  ? Pneumococcal Polysaccharide-23 09/08/2020  ? Tdap 12/14/2020  ? ?Pertinent  Health Maintenance Due  ?Topic Date Due  ? COLONOSCOPY (Pts 45-64yrs Insurance coverage will need to be confirmed)  Never done  ? INFLUENZA VACCINE  Completed  ? ? ?  03/25/2021  ?  8:50 AM 03/25/2021  ? 11:37 PM 03/31/2021  ?  9:23 AM 11/18/2021  ?  9:59 AM 11/29/2021  ?  1:51 PM  ?Fall Risk  ?Falls in the past year?   0  0  ?Was there an injury with Fall?     1  ?Fall Risk Category Calculator     1  ?Fall Risk Category     Low  ?Patient Fall Risk Level High fall risk High fall risk High fall risk High fall risk Low fall risk  ?Patient at Risk for Falls Due to   Impaired balance/gait;Impaired mobility  History of fall(s);Impaired mobility  ?Fall risk Follow up   Falls evaluation  completed  Education provided;Falls prevention discussed  ? ? ? ?Vitals:  ? 12/23/21 1558  ?BP: (!) 120/58  ?Pulse: 63  ?Resp: (!) 21  ?Temp: (!) 97.2 ?F (36.2 ?C)  ?Weight: 200 lb 12.8 oz (91.1 kg)  ?Height: 6' (1.829 m)  ? ?Body mass index is 27.23 kg/m?. ? ?Physical Exam ?Constitutional:   ?   General: He is not in acute distress. ?   Appearance: Normal appearance.  ?HENT:  ?   Head: Normocephalic and atraumatic.  ?   Mouth/Throat:  ?   Mouth: Mucous membranes are moist.  ?Eyes:  ?   Conjunctiva/sclera: Conjunctivae normal.  ?Cardiovascular:  ?   Rate and Rhythm: Normal rate and regular rhythm.  ?   Pulses: Normal pulses.  ?   Heart sounds: Normal heart sounds.  ?Pulmonary:  ?   Effort: Pulmonary effort is normal.  ?   Breath sounds: Normal breath sounds.  ?Abdominal:  ?   General: Bowel sounds are normal.  ?   Palpations: Abdomen is soft.  ?Musculoskeletal:     ?   General: No swelling. Normal range of motion.  ?   Cervical back: Normal range of motion.  ?Skin: ?   General: Skin is warm and dry.  ?Neurological:  ?   Mental Status: He is alert. Mental status is at baseline. He is  disoriented.  ?Psychiatric:     ?   Mood and Affect: Mood normal.     ?   Behavior: Behavior normal.     ?   Thought Content: Thought content normal.     ?   Judgment: Judgment normal.  ?  ? ? ? ?Labs reviewed: ?Recent

## 2021-12-23 NOTE — Telephone Encounter (Signed)
Nurse from [facility] called to report this resident has a temp of 100.4 and does not feel well. He has not felt well for the passed day. No specific symptoms.  She is requesting CBC CXR UA C and S, order given Also added covid swab.  ?

## 2022-01-04 ENCOUNTER — Non-Acute Institutional Stay (SKILLED_NURSING_FACILITY): Payer: Medicare Other | Admitting: Adult Health

## 2022-01-04 ENCOUNTER — Encounter: Payer: Self-pay | Admitting: Adult Health

## 2022-01-04 DIAGNOSIS — F320 Major depressive disorder, single episode, mild: Secondary | ICD-10-CM | POA: Diagnosis not present

## 2022-01-04 DIAGNOSIS — E039 Hypothyroidism, unspecified: Secondary | ICD-10-CM | POA: Diagnosis not present

## 2022-01-04 NOTE — Progress Notes (Signed)
? ?Location:  [facility] ?Nursing Home Room Number: [id number] ?Place of Service:  SNF (31) ?Provider:  Durenda Age, DNP, FNP-BC ? ?Patient Care Team: ?Hendricks Limes, MD as PCP - General (Internal Medicine) ?Constance Haw, MD as PCP - Electrophysiology (Cardiology) ?Medina-Vargas, Senaida Lange, NP as Nurse Practitioner (Internal Medicine) ? ?Extended Emergency Contact Information ?Primary Emergency Contact: Frid,Haven ? [place] ?Home Phone: [phone] ?Relation: Daughter ?Secondary Emergency Contact: SAPP,RON ?Home Phone: [phone] ?Relation: Other ? ?Code Status:  DNR ? ?Goals of care: Advanced Directive information ? ?  01/04/2022  ? 11:39 AM  ?Advanced Directives  ?Does Patient Have a Medical Advance Directive? Yes  ?Type of Advance Directive Out of facility DNR (pink MOST or yellow form)  ?Does patient want to make changes to medical advance directive? No - Patient declined  ?Pre-existing out of facility DNR order (yellow form or pink MOST form) Yellow form placed in chart (order not valid for inpatient use)  ? ? ? ?Chief Complaint  ?Patient presents with  ? Acute Visit  ?  Depression  ? ? ?HPI:  ?Pt is a 71 y.o. male seen today for an acute visit regarding depression. He is a long-term care resident of [facility]. He stated that he is feeling down and wants to go home. PHQ-9 score 8, ranging in mild depression.  He verbalized having little interest/pleasure in doing things, feels down and depressed everyday, trouble falling asleep and feeling bad about himself. He denies any thoughts of harming himself. He is not on any psychotropics. ? ? ?Past Medical History:  ?Diagnosis Date  ? Chronic kidney disease   ? Chronic systolic CHF (congestive heart failure) ([place])   ? EF 35-40, diffuse HK, mild MR, moderate LAE, mild RAE, PASP 44, L pleural eff  ? Dilated cardiomyopathy ([place])   ? likely related to tachycardia  ? Dysrhythmia   ? Persistent atrial  fibrillation ([place])   ? ?Past Surgical History:  ?Procedure Laterality Date  ? CARDIOVERSION N/A 08/20/2017  ? Procedure: CARDIOVERSION;  Surgeon: Jerline Pain, MD;  Location: [facility] ENDOSCOPY;  Service: Cardiovascular;  Laterality: N/A;  ? CARDIOVERSION N/A 11/18/2021  ? Procedure: CARDIOVERSION;  Surgeon: [name], MD;  Location: [facility];  Service: Cardiovascular;  Laterality: N/A;  ? INCISION AND DRAINAGE ABSCESS Left 03/22/2014  ? Procedure: INCISION AND DRAINAGE ABSCESS LEFT BUTTOCK ABSCESS;  Surgeon: Zenovia Jarred, MD;  Location: [facility];  Service: General;  Laterality: Left;  ? ? ?No Known Allergies ? ?Outpatient Encounter Medications as of 01/04/2022  ?Medication Sig  ? allopurinol (ZYLOPRIM) 300 MG tablet Take 150 mg by mouth daily. (0800)  ? amiodarone (PACERONE) 200 MG tablet Take 200 mg by mouth daily. (0800)  ? apixaban (ELIQUIS) 5 MG TABS tablet Take 1 tablet (5 mg total) by mouth 2 (two) times daily.  ? atorvastatin (LIPITOR) 20 MG tablet Take 1 tablet (20 mg total) by mouth every evening.  ? Cholecalciferol (VITAMIN D) 50 MCG (2000 UT) tablet Take 2,000 Units by mouth daily. (0900)  ? colchicine 0.6 MG tablet Take by mouth as directed. Take 2 tablets (1.2 mg) by mouth at the first sign of a gout flare up and then take one tablet in one hour if symptoms continue  ? diltiazem (CARDIZEM) 90 MG tablet Take 90 mg by mouth every 8 (eight) hours. (0600, 1400 & 2200) Hold for SBP < 100 and Heart rate < 60  ? divalproex (DEPAKOTE SPRINKLE) 125 MG  capsule Take 125 mg by mouth 3 (three) times daily. (0800, 1200 & 2000)  ? levothyroxine (SYNTHROID) 25 MCG tablet Take 25 mcg by mouth daily before breakfast.  ? Menthol, Topical Analgesic, (BIOFREEZE) 4 % GEL Apply 1 application topically in the morning, at noon, and at bedtime. (0600, 1400 & 2200) APPLY TOPICALLY TO LEFT SHOULDER THREE TIMES DAILY FOR PAIN  ? midodrine (PROAMATINE) 5 MG tablet Take 5 mg by mouth 3 (three) times daily. (1000, 1400 & 2000)  ?  NON FORMULARY MAGIC CUP: ONE MAGIC CUP DAILY FOR WOUND HEALING  ? NON FORMULARY Diet: Regular/thin diet consistency  ? pantoprazole (PROTONIX) 40 MG tablet Take 1 tablet (40 mg total) by mouth daily.  ? polyethylene glycol (MIRALAX / GLYCOLAX) 17 g packet Take 17 g by mouth daily. (0800)  ? tamsulosin (FLOMAX) 0.4 MG CAPS capsule Take 1 capsule (0.4 mg total) by mouth daily after supper.  ? traMADol (ULTRAM) 50 MG tablet Take 0.5 tablets (25 mg total) by mouth 2 (two) times daily as needed.  ? vitamin C (ASCORBIC ACID) 500 MG tablet Take 500 mg by mouth 2 (two) times daily. (0800 & 2000)  ? [DISCONTINUED] bisacodyl (DULCOLAX) 10 MG suppository Place 10 mg rectally daily as needed for moderate constipation.  ? [DISCONTINUED] magnesium hydroxide (MILK OF MAGNESIA) 400 MG/5ML suspension Take 30 mLs by mouth daily as needed for mild constipation.  ? [DISCONTINUED] Sodium Phosphates (RA SALINE ENEMA) 19-7 GM/118ML ENEM Place 1 Bottle rectally daily as needed (severe constipation.).  ? ?No facility-administered encounter medications on file as of 01/04/2022.  ? ? ?Review of Systems  ?Constitutional:  Negative for activity change, appetite change and fever.  ?HENT:  Negative for sore throat.   ?Eyes: Negative.   ?Cardiovascular:  Negative for chest pain and leg swelling.  ?Gastrointestinal:  Negative for abdominal distention, diarrhea and vomiting.  ?Genitourinary:  Negative for dysuria, frequency and urgency.  ?Skin:  Negative for color change.  ?Neurological:  Negative for dizziness and headaches.  ?Psychiatric/Behavioral:  Positive for sleep disturbance. Negative for behavioral problems. The patient is not nervous/anxious.    ? ? ? ?Immunization History  ?Administered Date(s) Administered  ? DTaP 12/14/2020  ? Fluad Quad(high Dose 65+) 08/18/2021  ? Moderna Covid-19 Vaccine Bivalent Booster 20yrs & up 09/28/2021  ? Moderna Sars-Covid-2 Vaccination 03/10/2021  ? PFIZER(Purple Top)SARS-COV-2 Vaccination 12/07/2020  ?  Pneumococcal Conjugate-13 10/28/2018  ? Pneumococcal Polysaccharide-23 09/08/2020  ? Tdap 12/14/2020  ? ?Pertinent  Health Maintenance Due  ?Topic Date Due  ? URINE MICROALBUMIN  Never done  ? COLONOSCOPY (Pts 45-6yrs Insurance coverage will need to be confirmed)  Never done  ? INFLUENZA VACCINE  05/02/2022  ? ? ?  03/25/2021  ?  8:50 AM 03/25/2021  ? 11:37 PM 03/31/2021  ?  9:23 AM 11/18/2021  ?  9:59 AM 11/29/2021  ?  1:51 PM  ?Fall Risk  ?Falls in the past year?   0  0  ?Was there an injury with Fall?     1  ?Fall Risk Category Calculator     1  ?Fall Risk Category     Low  ?Patient Fall Risk Level High fall risk High fall risk High fall risk High fall risk Low fall risk  ?Patient at Risk for Falls Due to   Impaired balance/gait;Impaired mobility  History of fall(s);Impaired mobility  ?Fall risk Follow up   Falls evaluation completed  Education provided;Falls prevention discussed  ? ? ? ?Vitals:  ? 01/04/22  1132  ?BP: 117/69  ?Pulse: 60  ?Resp: 20  ?Temp: (!) 97.4 ?F (36.3 ?C)  ?Weight: 200 lb 3.2 oz (90.8 kg)  ?Height: 6' (1.829 m)  ? ?Body mass index is 27.15 kg/m?. ? ?Physical Exam ?Constitutional:   ?   General: He is not in acute distress. ?   Appearance: Normal appearance.  ?HENT:  ?   Head: Normocephalic and atraumatic.  ?   Mouth/Throat:  ?   Mouth: Mucous membranes are moist.  ?Eyes:  ?   Conjunctiva/sclera: Conjunctivae normal.  ?Cardiovascular:  ?   Rate and Rhythm: Normal rate and regular rhythm.  ?   Pulses: Normal pulses.  ?   Heart sounds: Normal heart sounds.  ?Pulmonary:  ?   Effort: Pulmonary effort is normal.  ?   Breath sounds: Normal breath sounds.  ?Abdominal:  ?   General: Bowel sounds are normal.  ?   Palpations: Abdomen is soft.  ?Musculoskeletal:  ?   Cervical back: Normal range of motion.  ?Skin: ?   General: Skin is warm and dry.  ?Neurological:  ?   General: No focal deficit present.  ?   Mental Status: He is alert. He is disoriented.  ?   Comments: Alert to self, disoriented to time and  place.  ?Psychiatric:     ?   Behavior: Behavior normal.     ?   Thought Content: Thought content normal.     ?   Judgment: Judgment normal.  ?   Comments: Sad affect.  ?  ? ? ?Labs reviewed: ?Recent Labs  ?  06/

## 2022-01-11 DIAGNOSIS — F32A Depression, unspecified: Secondary | ICD-10-CM | POA: Diagnosis not present

## 2022-01-13 DIAGNOSIS — I10 Essential (primary) hypertension: Secondary | ICD-10-CM | POA: Diagnosis not present

## 2022-01-23 ENCOUNTER — Non-Acute Institutional Stay (SKILLED_NURSING_FACILITY): Payer: Medicare Other | Admitting: Internal Medicine

## 2022-01-23 ENCOUNTER — Encounter: Payer: Self-pay | Admitting: Internal Medicine

## 2022-01-23 DIAGNOSIS — N183 Chronic kidney disease, stage 3 unspecified: Secondary | ICD-10-CM | POA: Diagnosis not present

## 2022-01-23 DIAGNOSIS — D649 Anemia, unspecified: Secondary | ICD-10-CM

## 2022-01-23 DIAGNOSIS — I5022 Chronic systolic (congestive) heart failure: Secondary | ICD-10-CM | POA: Diagnosis not present

## 2022-01-23 DIAGNOSIS — I4819 Other persistent atrial fibrillation: Secondary | ICD-10-CM

## 2022-01-23 DIAGNOSIS — R7989 Other specified abnormal findings of blood chemistry: Secondary | ICD-10-CM

## 2022-01-23 LAB — TSH: TSH: 5 (ref 0.41–5.90)

## 2022-01-23 NOTE — Assessment & Plan Note (Signed)
He does have one half-1+ pedal edema, but there is no neck vein distention or hepatojugular reflux.  CHF appears to be clinically compensated. ?

## 2022-01-23 NOTE — Assessment & Plan Note (Addendum)
In January TSH was 7.00 in the context of amiodarone therapy. On 11/21/21 TSH was 10.55 & L thyroxine 25 mcg was started. TSH will be updated. ?

## 2022-01-23 NOTE — Assessment & Plan Note (Signed)
Heart sounds are markedly distant making it difficult to discern the rhythm.  Rate is well controlled. ?Cardiology scheduled follow-up is 4/28.  As a prelude to that visit, TSH will be checked as he is on amiodarone. ?

## 2022-01-23 NOTE — Patient Instructions (Signed)
See assessment and plan under each diagnosis in the problem list and acutely for this visit 

## 2022-01-23 NOTE — Assessment & Plan Note (Signed)
Anemia is mild and also stable with current H/H of 12.9/37.  No bleeding dyscrasias reported by staff. ?

## 2022-01-23 NOTE — Assessment & Plan Note (Signed)
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 ?

## 2022-01-23 NOTE — Progress Notes (Signed)
? ?NURSING HOME LOCATION:  [place] ?ROOM NUMBER:  [id number] ? ?CODE STATUS:  DNR ? ?PCP:  Durenda Age NP,PSC ? ?This is a nursing facility follow up visit of chronic medical diagnoses & to document compliance with Regulation 483.30 (c) in [employer] [place] Phase [employer] which mandates caregiver visit ( visits can alternate among physician, PA or NP as per statutes) within 10 days of 30 days / 60 days/ 90 days post admission to SNF date   ? ?Interim medical record and care since last SNF visit was updated with review of diagnostic studies and change in clinical status since last visit were documented. ? ?HPI: He is a permanent resident of facility with medical diagnoses of history of gout, atrial fibrillation, dyslipidemia, hypothyroidism, GERD, history of chronic systolic congestive heart failure, and CKD. ?He has undergone cardioversion x2. ? ?Mild, stable anemia is present with H/H of 12.9/37.  Creatinine is 1.0 and GFR 73.65, high stage II CKD.  Uric acid was last checked in November 2022 and was at goal with a value of 5.2 . He is on low-dose allopurinol.  LDL was 81 in January of this year. On 11/21/21 TSH was elevated at 10.55 ;on 2/26 25 mcg L thyroxine was initiated.  He is on amiodarone.  Cardiology follow-up is scheduled 4/28. ? ?Review of systems: He states he is "better" and denies any "problems".  He went on to say "if I have any trouble at all it's numbness in feet." ? ?Constitutional: No fever, significant weight change, fatigue  ?Eyes: No redness, discharge, pain, vision change ?ENT/mouth: No nasal congestion,  purulent discharge, earache, change in hearing, sore throat  ?Cardiovascular: No chest pain, palpitations, paroxysmal nocturnal dyspnea, claudication, edema  ?Respiratory: No cough, sputum production, hemoptysis, DOE, significant snoring, apnea   ?Gastrointestinal: No heartburn, dysphagia, abdominal pain, nausea /vomiting, rectal bleeding, melena,  change in bowels ?Genitourinary: No dysuria, hematuria, pyuria, incontinence, nocturia ?Musculoskeletal: No joint stiffness, joint swelling, weakness, pain ?Dermatologic: No rash, pruritus, change in appearance of skin ?Neurologic: No dizziness, headache, syncope, seizures ?Psychiatric: No significant anxiety, depression, insomnia, anorexia ?Endocrine: No change in hair/skin/nails, excessive thirst, excessive hunger, excessive urination  ?Hematologic/lymphatic: No significant bruising, lymphadenopathy, abnormal bleeding ?Allergy/immunology: No itchy/watery eyes, significant sneezing, urticaria, angioedema ? ?Physical exam:  ?Pertinent or positive findings: Affect is flat.  Hair is disheveled.  The lower lids are puffy.  The left nasolabial fold is slightly decreased compared to the right.  Heart sounds are markedly distant and rate slow.  It is difficult to discern whether the rhythm is irregular.  Breath sounds are decreased.  He has 1/2+ edema.  Pedal pulses are decreased.  Deep tendon reflexes are one half-1+ at the upper extremities and 0-1/2+ in the lower extremities.  He is very strong to opposition. ? ?General appearance: Adequately nourished; no acute distress, increased work of breathing is present.   ?Lymphatic: No lymphadenopathy about the head, neck, axilla. ?Eyes: No conjunctival inflammation or lid edema is present. There is no scleral icterus. ?Ears:  External ear exam shows no significant lesions or deformities.   ?Nose:  External nasal examination shows no deformity or inflammation. Nasal mucosa are pink and moist without lesions, exudates ?Oral exam:  Lips and gums are healthy appearing. There is no oropharyngeal erythema or exudate. ?Neck:  No thyromegaly, masses, tenderness noted.    ?Heart:  No gallop, murmur, click, rub .  ?Lungs:  without wheezes, rhonchi, rales, rubs. ?Abdomen:  Bowel sounds are normal. Abdomen is soft and nontender with no organomegaly, hernias, masses. ?GU: Deferred   ?Extremities:  No cyanosis, clubbing ?Neurologic exam :Balance, Rhomberg, finger to nose testing could not be completed due to clinical state ?Skin: Warm & dry w/o tenting. ?No significant lesions or rash. ? ?See summary under each active problem in the Problem List with associated updated therapeutic plan ? ? ?

## 2022-01-24 DIAGNOSIS — E0590 Thyrotoxicosis, unspecified without thyrotoxic crisis or storm: Secondary | ICD-10-CM | POA: Diagnosis not present

## 2022-01-26 NOTE — Progress Notes (Signed)
? ?PCP:  Pecola Lawless, MD ?Primary Cardiologist: None ?Electrophysiologist: Will [name], MD  ? ?Shigeru Lampert is a 71 y.o. male seen today for Will [name], MD for routine electrophysiology followup.  Since last being seen in our clinic the patient reports doing about the same. Remains fatigue and troubled that it has been years since he has truly been able to walk. Though from a cardiac perspective he has been doing OK. he denies chest pain, palpitations, dyspnea, PND, orthopnea, nausea, vomiting, dizziness, syncope, edema, weight gain, or early satiety. ? ?Past Medical History:  ?Diagnosis Date  ? Chronic kidney disease   ? Chronic systolic CHF (congestive heart failure) ([facility])   ? EF 35-40, diffuse HK, mild MR, moderate LAE, mild RAE, PASP 44, L pleural eff  ? Dilated cardiomyopathy ([facility])   ? likely related to tachycardia  ? Dysrhythmia   ? Persistent atrial fibrillation ([facility])   ? ?Past Surgical History:  ?Procedure Laterality Date  ? CARDIOVERSION N/A 08/20/2017  ? Procedure: CARDIOVERSION;  Surgeon: [name], MD;  Location: [facility] ENDOSCOPY;  Service: Cardiovascular;  Laterality: N/A;  ? CARDIOVERSION N/A 11/18/2021  ? Procedure: CARDIOVERSION;  Surgeon: [name], MD;  Location: [facility] ENDOSCOPY;  Service: Cardiovascular;  Laterality: N/A;  ? INCISION AND DRAINAGE ABSCESS Left 03/22/2014  ? Procedure: INCISION AND DRAINAGE ABSCESS LEFT BUTTOCK ABSCESS;  Surgeon: [name], MD;  Location: [facility] OR;  Service: General;  Laterality: Left;  ? ? ?Current Outpatient Medications  ?Medication Sig Dispense Refill  ? allopurinol (ZYLOPRIM) 300 MG tablet Take 150 mg by mouth daily. (0800)    ? amiodarone (PACERONE) 200 MG tablet Take 200 mg by mouth daily. (0800)    ? apixaban (ELIQUIS) 5 MG TABS tablet Take 1 tablet (5 mg total) by mouth 2 (two) times daily.    ? atorvastatin (LIPITOR) 20 MG tablet Take 1 tablet (20 mg total) by mouth every evening.    ? Cholecalciferol (VITAMIN D) 50 MCG (2000 UT)  tablet Take 2,000 Units by mouth daily. (0900)    ? colchicine 0.6 MG tablet Take by mouth as directed. Take 2 tablets (1.2 mg) by mouth at the first sign of a gout flare up and then take one tablet in one hour if symptoms continue    ? diltiazem (CARDIZEM) 90 MG tablet Take 90 mg by mouth every 8 (eight) hours. (0600, 1400 & 2200) Hold for SBP < 100 and Heart rate < 60    ? divalproex (DEPAKOTE SPRINKLE) 125 MG capsule Take 125 mg by mouth 3 (three) times daily. (0800, 1200 & 2000)    ? levothyroxine (SYNTHROID) 25 MCG tablet Take 25 mcg by mouth daily before breakfast.    ? Menthol, Topical Analgesic, (BIOFREEZE) 4 % GEL Apply 1 application topically in the morning, at noon, and at bedtime. (0600, 1400 & 2200) APPLY TOPICALLY TO LEFT SHOULDER THREE TIMES DAILY FOR PAIN    ? midodrine (PROAMATINE) 5 MG tablet Take 5 mg by mouth 3 (three) times daily. (1000, 1400 & 2000)    ? NON FORMULARY MAGIC CUP: ONE MAGIC CUP DAILY FOR WOUND HEALING    ? NON FORMULARY Diet: Regular/thin diet consistency    ? pantoprazole (PROTONIX) 40 MG tablet Take 1 tablet (40 mg total) by mouth daily.    ? polyethylene glycol (MIRALAX / GLYCOLAX) 17 g packet Take 17 g by mouth daily. (0800)    ? sertraline (ZOLOFT) 50 MG tablet Take 50 mg by mouth  daily. At bedtime for sleep    ? tamsulosin (FLOMAX) 0.4 MG CAPS capsule Take 1 capsule (0.4 mg total) by mouth daily after supper. 30 capsule   ? traMADol (ULTRAM) 50 MG tablet Take 0.5 tablets (25 mg total) by mouth 2 (two) times daily as needed. 30 tablet 0  ? vitamin C (ASCORBIC ACID) 500 MG tablet Take 500 mg by mouth 2 (two) times daily. (0800 & 2000)    ? ?No current facility-administered medications for this visit.  ? ? ?No Known Allergies ? ?Social History  ? ?Socioeconomic History  ? Marital status: Divorced  ?  Spouse name: Not on file  ? Number of children: Not on file  ? Years of education: Not on file  ? Highest education level: Not on file  ?Occupational History  ? Not on file   ?Tobacco Use  ? Smoking status: Never  ? Smokeless tobacco: Never  ?Vaping Use  ? Vaping Use: Never used  ?Substance and Sexual Activity  ? Alcohol use: Not Currently  ?  Comment: pt states he "does not drink a lot anymore"  ? Drug use: No  ? Sexual activity: Not Currently  ?Other Topics Concern  ? Not on file  ?Social History Narrative  ? Not on file  ? ?Social Determinants of Health  ? ?Financial Resource Strain: Not on file  ?Food Insecurity: Not on file  ?Transportation Needs: Not on file  ?Physical Activity: Not on file  ?Stress: Not on file  ?Social Connections: Not on file  ?Intimate Partner Violence: Not on file  ? ? ? ?Review of Systems: ?All other systems reviewed and are otherwise negative except as noted above. ? ?Physical Exam: ?Vitals:  ? 01/27/22 0852  ?BP: 118/62  ?Pulse: (!) 52  ?SpO2: 99%  ?Weight: 202 lb (91.6 kg)  ?Height: 6' (1.829 m)  ? ? ?GEN- The patient is well appearing, alert and oriented x 3 today.   ?HEENT: normocephalic, atraumatic; sclera clear, conjunctiva pink; hearing intact; oropharynx clear; neck supple, no JVP ?Lymph- no cervical lymphadenopathy ?Lungs- Clear to ausculation bilaterally, normal work of breathing.  No wheezes, rales, rhonchi ?Heart- Regular rate and rhythm, no murmurs, rubs or gallops, PMI not laterally displaced ?GI- soft, non-tender, non-distended, bowel sounds present, no hepatosplenomegaly ?Extremities- no clubbing, cyanosis, or edema; DP/PT/radial pulses 2+ bilaterally ?MS- no significant deformity or atrophy ?Skin- warm and dry, no rash or lesion ?Psych- euthymic mood, full affect ?Neuro- strength and sensation are intact ? ?EKG is ordered. Personal review of EKG from today shows sinus bradycardia at 52 bpm  ? ?Additional studies reviewed include: ?Previous EP office notes.  ? ?Assessment and Plan: ? ?1. Persistent atrial fibrillation ?Continue eliquis 5 mg BID and amiodarone 200 mg daily ?EKG today shows sinus bradycardia.  ?CHA2DS2VASC is at least 5 ? ?2.  HTN ?Stable on current regimen  ? ?3. DM ?Per primary.  ?  ?4. Sinus bradycardia ?Asymptomatic.  ? ?5. Chronic weakness LEs ?By chart it has been several years since he has been able to walk well.  ?He is frustrated as he feels like he hasn't every really gotten a good reason as to why. Likely long combination of deconditioning and co morbidities.  ? ?Follow up with Dr. Elberta Fortis in 6 months  ? ?Graciella Freer, PA-C  ?01/27/22 ?9:01 AM  ?

## 2022-01-27 ENCOUNTER — Ambulatory Visit (INDEPENDENT_AMBULATORY_CARE_PROVIDER_SITE_OTHER): Payer: Medicare Other | Admitting: Student

## 2022-01-27 ENCOUNTER — Encounter: Payer: Self-pay | Admitting: Student

## 2022-01-27 VITALS — BP 118/62 | HR 52 | Ht 72.0 in | Wt 202.0 lb

## 2022-01-27 DIAGNOSIS — I4819 Other persistent atrial fibrillation: Secondary | ICD-10-CM | POA: Diagnosis not present

## 2022-01-27 DIAGNOSIS — I10 Essential (primary) hypertension: Secondary | ICD-10-CM

## 2022-01-27 NOTE — Patient Instructions (Signed)
Medication Instructions:  Your physician recommends that you continue on your current medications as directed. Please refer to the Current Medication list given to you today.  *If you need a refill on your cardiac medications before your next appointment, please call your pharmacy*   Lab Work: None  If you have labs (blood work) drawn today and your tests are completely normal, you will receive your results only by: MyChart Message (if you have MyChart) OR A paper copy in the mail If you have any lab test that is abnormal or we need to change your treatment, we will call you to review the results.  Follow-Up: At [facility], you and your health needs are our priority.  As part of our continuing mission to provide you with exceptional heart care, we have created designated Provider Care Teams.  These Care Teams include your primary Cardiologist (physician) and Advanced Practice Providers (APPs -  Physician Assistants and Nurse Practitioners) who all work together to provide you with the care you need, when you need it.  We recommend signing up for the patient portal called "MyChart".  Sign up information is provided on this After Visit Summary.  MyChart is used to connect with patients for Virtual Visits (Telemedicine).  Patients are able to view lab/test results, encounter notes, upcoming appointments, etc.  Non-urgent messages can be sent to your provider as well.   To learn more about what you can do with MyChart, go to [email or web address].    Your next appointment:   6 month(s)  The format for your next appointment:   In Person  Provider:   Will Camnitz, MD{   

## 2022-01-27 NOTE — Addendum Note (Signed)
Addended by: Cleda Mccreedy on: 01/27/2022 10:19 AM ? ? Modules accepted: Orders ? ?

## 2022-02-01 ENCOUNTER — Non-Acute Institutional Stay (SKILLED_NURSING_FACILITY): Payer: Medicare Other | Admitting: Adult Health

## 2022-02-01 ENCOUNTER — Encounter: Payer: Self-pay | Admitting: Adult Health

## 2022-02-01 DIAGNOSIS — F0150 Vascular dementia without behavioral disturbance: Secondary | ICD-10-CM | POA: Diagnosis not present

## 2022-02-01 DIAGNOSIS — I4819 Other persistent atrial fibrillation: Secondary | ICD-10-CM | POA: Diagnosis not present

## 2022-02-01 DIAGNOSIS — Z7189 Other specified counseling: Secondary | ICD-10-CM

## 2022-02-01 DIAGNOSIS — I959 Hypotension, unspecified: Secondary | ICD-10-CM

## 2022-02-01 DIAGNOSIS — M1A09X0 Idiopathic chronic gout, multiple sites, without tophus (tophi): Secondary | ICD-10-CM

## 2022-02-01 DIAGNOSIS — R339 Retention of urine, unspecified: Secondary | ICD-10-CM | POA: Diagnosis not present

## 2022-02-01 NOTE — Progress Notes (Signed)
? ?Location:  [facility] ?Nursing Home Room Number: [id number] ?Place of Service:  SNF (31) ?Provider:  Durenda Age, DNP, FNP-BC ? ?Patient Care Team: ?Hendricks Limes, MD as PCP - General (Internal Medicine) ?Constance Haw, MD as PCP - Electrophysiology (Cardiology) ?Medina-Vargas, Senaida Lange, NP as Nurse Practitioner (Internal Medicine) ? ?Extended Emergency Contact Information ?Primary Emergency Contact: Bultema,Haven ? [place] ?Home Phone: [phone] ?Relation: Daughter ?Secondary Emergency Contact: SAPP,RON ?Home Phone: [phone] ?Relation: Other ? ?Code Status:  DNR ? ?Goals of care: Advanced Directive information ? ?  02/01/2022  ? 11:02 AM  ?Advanced Directives  ?Does Patient Have a Medical Advance Directive? Yes  ?Type of Advance Directive Out of facility DNR (pink MOST or yellow form)  ?Does patient want to make changes to medical advance directive? No - Guardian declined  ?Pre-existing out of facility DNR order (yellow form or pink MOST form) Yellow form placed in chart (order not valid for inpatient use)  ? ? ? ?Chief Complaint  ?Patient presents with  ? Medical Management of Chronic Issues  ?  Patient is here for care plan meeting  ? ? ?HPI:  ?Pt is a 71 y.o. male  who had a care plan meeting today attended by social worker, NP, Glass blower/designer, dietitian and resident. He remains to be DNR for code status.  Discussed medications, vital signs and weights.  He has a good appetite and comes to the dining room for meals.  He goes to social  activities. He has no gout flares. Latest weight is 205.4 lbs, Body mass index is 27.86 kg/m?Marland Kitchen The meeting lasted for 20 minutes. ? ? ?Past Medical History:  ?Diagnosis Date  ? Chronic kidney disease   ? Chronic systolic CHF (congestive heart failure) ([place])   ? EF 35-40, diffuse HK, mild MR, moderate LAE, mild RAE, PASP 44, L pleural eff  ? Dilated cardiomyopathy ([place])   ? likely related to tachycardia  ? Dysrhythmia   ? Persistent  atrial fibrillation ([place])   ? ?Past Surgical History:  ?Procedure Laterality Date  ? CARDIOVERSION N/A 08/20/2017  ? Procedure: CARDIOVERSION;  Surgeon: Jerline Pain, MD;  Location: [facility] ENDOSCOPY;  Service: Cardiovascular;  Laterality: N/A;  ? CARDIOVERSION N/A 11/18/2021  ? Procedure: CARDIOVERSION;  Surgeon: [name], MD;  Location: [place];  Service: Cardiovascular;  Laterality: N/A;  ? INCISION AND DRAINAGE ABSCESS Left 03/22/2014  ? Procedure: INCISION AND DRAINAGE ABSCESS LEFT BUTTOCK ABSCESS;  Surgeon: Zenovia Jarred, MD;  Location: [place];  Service: General;  Laterality: Left;  ? ? ?No Known Allergies ? ?Outpatient Encounter Medications as of 02/01/2022  ?Medication Sig  ? allopurinol (ZYLOPRIM) 300 MG tablet Take 150 mg by mouth daily. (0800)  ? amiodarone (PACERONE) 200 MG tablet Take 200 mg by mouth daily. (0800)  ? apixaban (ELIQUIS) 5 MG TABS tablet Take 1 tablet (5 mg total) by mouth 2 (two) times daily.  ? atorvastatin (LIPITOR) 20 MG tablet Take 1 tablet (20 mg total) by mouth every evening.  ? Cholecalciferol (VITAMIN D) 50 MCG (2000 UT) tablet Take 2,000 Units by mouth daily. (0900)  ? colchicine 0.6 MG tablet Take by mouth as directed. Take 2 tablets (1.2 mg) by mouth at the first sign of a gout flare up and then take one tablet in one hour if symptoms continue  ? diltiazem (CARDIZEM) 90 MG tablet Take 90 mg by mouth every 8 (eight) hours. (0600, 1400 & 2200) Hold for SBP < 100  and Heart rate < 60  ? divalproex (DEPAKOTE SPRINKLE) 125 MG capsule Take 125 mg by mouth 3 (three) times daily. (0800, 1200 & 2000)  ? levothyroxine (SYNTHROID) 25 MCG tablet Take 25 mcg by mouth daily before breakfast.  ? Menthol, Topical Analgesic, (BIOFREEZE) 4 % GEL Apply 1 application topically in the morning, at noon, and at bedtime. (0600, 1400 & 2200) APPLY TOPICALLY TO LEFT SHOULDER THREE TIMES DAILY FOR PAIN  ? midodrine (PROAMATINE) 5 MG tablet Take 5 mg by mouth 3 (three) times daily. (1000, 1400 &  2000)  ? NON FORMULARY MAGIC CUP: ONE MAGIC CUP DAILY FOR WOUND HEALING  ? NON FORMULARY Diet: Regular/thin diet consistency  ? pantoprazole (PROTONIX) 40 MG tablet Take 1 tablet (40 mg total) by mouth daily.  ? polyethylene glycol (MIRALAX / GLYCOLAX) 17 g packet Take 17 g by mouth daily. (0800)  ? sertraline (ZOLOFT) 50 MG tablet Take 50 mg by mouth daily. At bedtime for sleep  ? tamsulosin (FLOMAX) 0.4 MG CAPS capsule Take 1 capsule (0.4 mg total) by mouth daily after supper.  ? traMADol (ULTRAM) 50 MG tablet Take 0.5 tablets (25 mg total) by mouth 2 (two) times daily as needed.  ? vitamin C (ASCORBIC ACID) 500 MG tablet Take 500 mg by mouth 2 (two) times daily. (0800 & 2000)  ? ?No facility-administered encounter medications on file as of 02/01/2022.  ? ? ?Review of Systems  ?Constitutional:  Negative for activity change, appetite change and fever.  ?HENT:  Negative for sore throat.   ?Eyes: Negative.   ?Cardiovascular:  Negative for chest pain and leg swelling.  ?Gastrointestinal:  Negative for abdominal distention, diarrhea and vomiting.  ?Genitourinary:  Negative for dysuria, frequency and urgency.  ?Skin:  Negative for color change.  ?Neurological:  Negative for dizziness and headaches.  ?Psychiatric/Behavioral:  Negative for behavioral problems and sleep disturbance. The patient is not nervous/anxious.    ? ? ? ?Immunization History  ?Administered Date(s) Administered  ? DTaP 12/14/2020  ? Fluad Quad(high Dose 65+) 08/18/2021  ? Moderna Covid-19 Vaccine Bivalent Booster 68yrs & up 09/28/2021  ? Moderna Sars-Covid-2 Vaccination 03/10/2021  ? PFIZER(Purple Top)SARS-COV-2 Vaccination 12/07/2020  ? Pneumococcal Conjugate-13 10/28/2018  ? Pneumococcal Polysaccharide-23 09/08/2020  ? Tdap 12/14/2020  ? ?Pertinent  Health Maintenance Due  ?Topic Date Due  ? URINE MICROALBUMIN  Never done  ? COLONOSCOPY (Pts 45-21yrs Insurance coverage will need to be confirmed)  Never done  ? INFLUENZA VACCINE  05/02/2022  ? ? ?   03/25/2021  ?  8:50 AM 03/25/2021  ? 11:37 PM 03/31/2021  ?  9:23 AM 11/18/2021  ?  9:59 AM 11/29/2021  ?  1:51 PM  ?Fall Risk  ?Falls in the past year?   0  0  ?Was there an injury with Fall?     1  ?Fall Risk Category Calculator     1  ?Fall Risk Category     Low  ?Patient Fall Risk Level High fall risk High fall risk High fall risk High fall risk Low fall risk  ?Patient at Risk for Falls Due to   Impaired balance/gait;Impaired mobility  History of fall(s);Impaired mobility  ?Fall risk Follow up   Falls evaluation completed  Education provided;Falls prevention discussed  ? ? ? ?Vitals:  ? 02/01/22 1107  ?BP: 100/66  ?Pulse: (!) 48  ?Resp: 19  ?Temp: 97.8 ?F (36.6 ?C)  ?Weight: 205 lb 6.4 oz (93.2 kg)  ? ?Body mass index is 27.86  kg/m?. ? ?Physical Exam ?Constitutional:   ?   General: He is not in acute distress. ?   Appearance: Normal appearance.  ?HENT:  ?   Head: Normocephalic and atraumatic.  ?   Mouth/Throat:  ?   Mouth: Mucous membranes are moist.  ?Eyes:  ?   Conjunctiva/sclera: Conjunctivae normal.  ?Cardiovascular:  ?   Rate and Rhythm: Normal rate and regular rhythm.  ?   Pulses: Normal pulses.  ?   Heart sounds: Normal heart sounds.  ?Pulmonary:  ?   Effort: Pulmonary effort is normal.  ?   Breath sounds: Normal breath sounds.  ?Abdominal:  ?   General: Bowel sounds are normal.  ?   Palpations: Abdomen is soft.  ?Musculoskeletal:     ?   General: No swelling.  ?   Cervical back: Normal range of motion.  ?Skin: ?   General: Skin is warm and dry.  ?Neurological:  ?   Mental Status: He is alert. Mental status is at baseline. He is disoriented.  ?Psychiatric:     ?   Mood and Affect: Mood normal.     ?   Behavior: Behavior normal.  ?  ? ? ?Labs reviewed: ?Recent Labs  ?  03/15/21 ?[id number] 03/16/21 ?0320 03/19/21 ?2115 03/20/21 ?0429 03/21/21 ?0501 03/22/21 ?[id number] 03/23/21 ?0500 03/25/21 ?0908 03/29/21 ?0000 10/05/21 ?0000 10/12/21 ?0000 11/05/21 ?0000  ?NA 140 143   < > 142   < > 140 139 137   < > 140 139 142  ?K 3.6  3.9   < > 4.7   < > 4.5 4.4 4.2   < > 4.2 4.3 3.9  ?CL 106 106   < > 108   < > 102 104 103   < > 105 104 107  ?CO2 26 28   < > 26   < > 28 27 24    < > 26* 26* 25*  ?GLUCOSE 105* 102*   < > 109*   < > 11

## 2022-02-02 DIAGNOSIS — E119 Type 2 diabetes mellitus without complications: Secondary | ICD-10-CM | POA: Diagnosis not present

## 2022-02-02 DIAGNOSIS — E559 Vitamin D deficiency, unspecified: Secondary | ICD-10-CM | POA: Diagnosis not present

## 2022-02-02 LAB — LIPID PANEL
Cholesterol: 138 (ref 0–200)
HDL: 26 — AB (ref 35–70)
LDL Cholesterol: 73
Triglycerides: 194 — AB (ref 40–160)

## 2022-02-02 LAB — BASIC METABOLIC PANEL
BUN: 20 (ref 4–21)
CO2: 26 — AB (ref 13–22)
Chloride: 104 (ref 99–108)
Creatinine: 1.3 (ref 0.6–1.3)
Glucose: 99
Potassium: 3.7 mEq/L (ref 3.5–5.1)
Sodium: 140 (ref 137–147)

## 2022-02-02 LAB — CBC AND DIFFERENTIAL
HCT: 38 — AB (ref 41–53)
Hemoglobin: 12.8 — AB (ref 13.5–17.5)
Platelets: 222 10*3/uL (ref 150–400)
WBC: 6.5

## 2022-02-02 LAB — CBC: RBC: 4.34 (ref 3.87–5.11)

## 2022-02-02 LAB — VITAMIN D 25 HYDROXY (VIT D DEFICIENCY, FRACTURES): Vit D, 25-Hydroxy: 41.6

## 2022-02-02 LAB — COMPREHENSIVE METABOLIC PANEL: Calcium: 9 (ref 8.7–10.7)

## 2022-02-08 DIAGNOSIS — H524 Presbyopia: Secondary | ICD-10-CM | POA: Diagnosis not present

## 2022-02-08 DIAGNOSIS — H43813 Vitreous degeneration, bilateral: Secondary | ICD-10-CM | POA: Diagnosis not present

## 2022-02-08 DIAGNOSIS — H25813 Combined forms of age-related cataract, bilateral: Secondary | ICD-10-CM | POA: Diagnosis not present

## 2022-02-08 DIAGNOSIS — H35363 Drusen (degenerative) of macula, bilateral: Secondary | ICD-10-CM | POA: Diagnosis not present

## 2022-02-13 DIAGNOSIS — I129 Hypertensive chronic kidney disease with stage 1 through stage 4 chronic kidney disease, or unspecified chronic kidney disease: Secondary | ICD-10-CM | POA: Diagnosis not present

## 2022-02-13 DIAGNOSIS — M6259 Muscle wasting and atrophy, not elsewhere classified, multiple sites: Secondary | ICD-10-CM | POA: Diagnosis not present

## 2022-02-14 DIAGNOSIS — I129 Hypertensive chronic kidney disease with stage 1 through stage 4 chronic kidney disease, or unspecified chronic kidney disease: Secondary | ICD-10-CM | POA: Diagnosis not present

## 2022-02-14 DIAGNOSIS — M6259 Muscle wasting and atrophy, not elsewhere classified, multiple sites: Secondary | ICD-10-CM | POA: Diagnosis not present

## 2022-02-15 DIAGNOSIS — I129 Hypertensive chronic kidney disease with stage 1 through stage 4 chronic kidney disease, or unspecified chronic kidney disease: Secondary | ICD-10-CM | POA: Diagnosis not present

## 2022-02-15 DIAGNOSIS — M6259 Muscle wasting and atrophy, not elsewhere classified, multiple sites: Secondary | ICD-10-CM | POA: Diagnosis not present

## 2022-02-16 DIAGNOSIS — I129 Hypertensive chronic kidney disease with stage 1 through stage 4 chronic kidney disease, or unspecified chronic kidney disease: Secondary | ICD-10-CM | POA: Diagnosis not present

## 2022-02-16 DIAGNOSIS — M6259 Muscle wasting and atrophy, not elsewhere classified, multiple sites: Secondary | ICD-10-CM | POA: Diagnosis not present

## 2022-02-17 ENCOUNTER — Non-Acute Institutional Stay (SKILLED_NURSING_FACILITY): Payer: Medicare Other | Admitting: Adult Health

## 2022-02-17 ENCOUNTER — Encounter: Payer: Self-pay | Admitting: Adult Health

## 2022-02-17 DIAGNOSIS — E039 Hypothyroidism, unspecified: Secondary | ICD-10-CM | POA: Diagnosis not present

## 2022-02-17 DIAGNOSIS — R339 Retention of urine, unspecified: Secondary | ICD-10-CM

## 2022-02-17 DIAGNOSIS — I959 Hypotension, unspecified: Secondary | ICD-10-CM | POA: Diagnosis not present

## 2022-02-17 DIAGNOSIS — I4819 Other persistent atrial fibrillation: Secondary | ICD-10-CM

## 2022-02-17 DIAGNOSIS — R569 Unspecified convulsions: Secondary | ICD-10-CM | POA: Diagnosis not present

## 2022-02-17 DIAGNOSIS — Z66 Do not resuscitate: Secondary | ICD-10-CM

## 2022-02-17 DIAGNOSIS — M6259 Muscle wasting and atrophy, not elsewhere classified, multiple sites: Secondary | ICD-10-CM | POA: Diagnosis not present

## 2022-02-17 DIAGNOSIS — F0150 Vascular dementia without behavioral disturbance: Secondary | ICD-10-CM

## 2022-02-17 DIAGNOSIS — I129 Hypertensive chronic kidney disease with stage 1 through stage 4 chronic kidney disease, or unspecified chronic kidney disease: Secondary | ICD-10-CM | POA: Diagnosis not present

## 2022-02-17 NOTE — Progress Notes (Signed)
Location:  [facility] Room Number: 106-A Place of Service:  SNF (31) Provider:  Kenard Gower, DNP, FNP-BC  Patient Care Team: Pecola Lawless, MD as PCP - General (Internal Medicine) Regan Lemming, MD as PCP - Electrophysiology (Cardiology) Medina-Vargas, Margit Banda, NP as Nurse Practitioner (Internal Medicine)  Extended Emergency Contact Information Primary Emergency Contact: Glendora Score [place] Home Phone: [phone] Relation: Daughter Secondary Emergency Contact: SAPP,RON Home Phone: [phone] Relation: Other  Code Status:  DNR  Goals of care: Advanced Directive information    02/01/2022   11:02 AM  Advanced Directives  Does Patient Have a Medical Advance Directive? Yes  Type of Advance Directive Out of facility DNR (pink MOST or yellow form)  Does patient want to make changes to medical advance directive? No - Guardian declined  Pre-existing out of facility DNR order (yellow form or pink MOST form) Yellow form placed in chart (order not valid for inpatient use)     Chief Complaint  Patient presents with   Medical Management of Chronic Issues    Routine Visit    HPI:  Pt is a 71 y.o. male seen today for medical management of chronic diseases. He is a long-term care resident of [facility].  Acquired hypothyroidism  - takes levothyroxine 25 mcg 1 tab daily  Persistent atrial fibrillation (HCC) -takes Eliquis 5 mg 1 tab twice a day and diltiazem 90 mg every 8 hours  Urinary retention -denies episodes of urinary retention, takes tamsulosin 0.4 mg 1 capsule daily  Seizure-like activity (H[facility]) -   no seizure like episodes, takes divalproex DR 125 mg 1 capsule 3 times a day  Hypotension, unspecified hypotension type -   SBPs ranging from 105-119, takes midodrine 5 mg 1 tab 3 times a day  Vascular dementia without behavioral disturbance (HCC)  -  BIMS score 7/15, ranging in severe  cognitive impairment    Past Medical History:  Diagnosis Date   Chronic kidney disease    Chronic systolic CHF (congestive heart failure) (H[facility])    EF 35-40, diffuse HK, mild MR, moderate LAE, mild RAE, PASP 44, L pleural eff   Dilated cardiomyopathy ([facility])    likely related to tachycardia   Dysrhythmia    Persistent atrial fibrillation [facility])    Past Surgical History:  Procedure Laterality Date   CARDIOVERSION N/A 08/20/2017   Procedure: CARDIOVERSION;  Surgeon: [name], MD;  Location: [facility] ENDOSCOPY;  Service: Cardiovascular;  Laterality: N/A;   CARDIOVERSION N/A 11/18/2021   Procedure: CARDIOVERSION;  Surgeon: [name], MD;  Location: [facility] ENDOSCOPY;  Service: Cardiovascular;  Laterality: N/A;   INCISION AND DRAINAGE ABSCESS Left 03/22/2014   Procedure: INCISION AND DRAINAGE ABSCESS LEFT BUTTOCK ABSCESS;  Surgeon: [name], MD;  Location: [facility] OR;  Service: General;  Laterality: Left;    Not on File  Outpatient Encounter Medications as of 02/17/2022  Medication Sig   allopurinol (ZYLOPRIM) 300 MG tablet Take 150 mg by mouth daily. (0800)   amiodarone (PACERONE) 200 MG tablet Take 200 mg by mouth daily. (0800)   apixaban (ELIQUIS) 5 MG TABS tablet Take 1 tablet (5 mg total) by mouth 2 (two) times daily.   atorvastatin (LIPITOR) 20 MG tablet Take 1 tablet (20 mg total) by mouth every evening.   Cholecalciferol (VITAMIN D) 50 MCG (2000 UT) tablet Take 2,000 Units by mouth daily. (0900)   colchicine 0.6 MG tablet Take by mouth as directed. Take 2 tablets (  1.2 mg) by mouth at the first sign of a gout flare up and then take one tablet in one hour if symptoms continue   diltiazem (CARDIZEM) 90 MG tablet Take 90 mg by mouth every 8 (eight) hours. (0600, 1400 & 2200) Hold for SBP < 100 and Heart rate < 60   divalproex (DEPAKOTE SPRINKLE) 125 MG capsule Take 125 mg by mouth 3 (three) times daily. (0800, 1200 & 2000)   levothyroxine (SYNTHROID) 25 MCG tablet Take 25 mcg by  mouth daily before breakfast.   midodrine (PROAMATINE) 5 MG tablet Take 5 mg by mouth 3 (three) times daily. (1000, 1400 & 2000)   NON FORMULARY Diet: Regular/thin diet consistency   pantoprazole (PROTONIX) 40 MG tablet Take 1 tablet (40 mg total) by mouth daily.   polyethylene glycol (MIRALAX / GLYCOLAX) 17 g packet Take 17 g by mouth daily. (0800)   sertraline (ZOLOFT) 50 MG tablet Take 50 mg by mouth daily. At bedtime for sleep   tamsulosin (FLOMAX) 0.4 MG CAPS capsule Take 1 capsule (0.4 mg total) by mouth daily after supper.   [DISCONTINUED] Menthol, Topical Analgesic, (BIOFREEZE) 4 % GEL Apply 1 application topically in the morning, at noon, and at bedtime. (0600, 1400 & 2200) APPLY TOPICALLY TO LEFT SHOULDER THREE TIMES DAILY FOR PAIN   [DISCONTINUED] NON FORMULARY MAGIC CUP: ONE MAGIC CUP DAILY FOR WOUND HEALING   [DISCONTINUED] traMADol (ULTRAM) 50 MG tablet Take 0.5 tablets (25 mg total) by mouth 2 (two) times daily as needed.   [DISCONTINUED] vitamin C (ASCORBIC ACID) 500 MG tablet Take 500 mg by mouth 2 (two) times daily. (0800 & 2000)   No facility-administered encounter medications on file as of 02/17/2022.    Review of Systems  Constitutional:  Negative for activity change, appetite change and fever.  HENT:  Negative for sore throat.   Eyes: Negative.   Cardiovascular:  Negative for chest pain and leg swelling.  Gastrointestinal:  Negative for abdominal distention, diarrhea and vomiting.  Genitourinary:  Negative for dysuria, frequency and urgency.  Skin:  Negative for color change.  Neurological:  Negative for dizziness and headaches.  Psychiatric/Behavioral:  Negative for behavioral problems and sleep disturbance. The patient is not nervous/anxious.       Immunization History  Administered Date(s) Administered   DTaP 12/14/2020   Fluad Quad(high Dose 65+) 08/18/2021   Moderna Covid-19 Vaccine Bivalent Booster 7yrs & up 09/28/2021   Moderna Sars-Covid-2 Vaccination  03/10/2021   PFIZER(Purple Top)SARS-COV-2 Vaccination 12/07/2020   Pneumococcal Conjugate-13 10/28/2018   Pneumococcal Polysaccharide-23 09/08/2020   Tdap 12/14/2020   Pertinent  Health Maintenance Due  Topic Date Due   URINE MICROALBUMIN  Never done   COLONOSCOPY (Pts 45-57yrs Insurance coverage will need to be confirmed)  Never done   INFLUENZA VACCINE  05/02/2022      03/25/2021    8:50 AM 03/25/2021   11:37 PM 03/31/2021    9:23 AM 11/18/2021    9:59 AM 11/29/2021    1:51 PM  Fall Risk  Falls in the past year?   0  0  Was there an injury with Fall?     1  Fall Risk Category Calculator     1  Fall Risk Category     Low  Patient Fall Risk Level High fall risk High fall risk High fall risk High fall risk Low fall risk  Patient at Risk for Falls Due to   Impaired balance/gait;Impaired mobility  History of fall(s);Impaired mobility  Fall  risk Follow up   Falls evaluation completed  Education provided;Falls prevention discussed     Vitals:   02/17/22 1509  BP: 122/81  Pulse: 62  Resp: 18  Temp: 97.7 F (36.5 C)  Weight: 205 lb 6.4 oz (93.2 kg)  Height: 6' (1.829 m)   Body mass index is 27.86 kg/m.  Physical Exam Constitutional:      Appearance: Normal appearance.  HENT:     Head: Normocephalic and atraumatic.     Mouth/Throat:     Mouth: Mucous membranes are moist.  Eyes:     Conjunctiva/sclera: Conjunctivae normal.  Cardiovascular:     Rate and Rhythm: Normal rate and regular rhythm.     Pulses: Normal pulses.     Heart sounds: Normal heart sounds.  Pulmonary:     Effort: Pulmonary effort is normal.     Breath sounds: Normal breath sounds.  Abdominal:     General: Bowel sounds are normal.     Palpations: Abdomen is soft.  Musculoskeletal:        General: No swelling. Normal range of motion.     Cervical back: Normal range of motion.  Skin:    General: Skin is warm and dry.  Neurological:     Mental Status: He is alert. Mental status is at baseline. He is  disoriented.     Comments: Alert to self, disoriented to time and place.  Psychiatric:        Mood and Affect: Mood normal.        Behavior: Behavior normal.        Thought Content: Thought content normal.        Judgment: Judgment normal.       Labs reviewed: Recent Labs    03/15/21 0348 03/16/21 0320 03/19/21 2115 03/20/21 0429 03/21/21 0501 03/22/21 0445 03/23/21 0500 03/25/21 0908 03/29/21 0000 10/12/21 0000 11/05/21 0000 02/02/22 0000  NA 140 143   < > 142   < > 140 139 137   < > 139 142 140  K 3.6 3.9   < > 4.7   < > 4.5 4.4 4.2   < > 4.3 3.9 3.7  CL 106 106   < > 108   < > 102 104 103   < > 104 107 104  CO2 26 28   < > 26   < > 28 27 24    < > 26* 25* 26*  GLUCOSE 105* 102*   < > 109*   < > 114* 127* 151*  --   --   --   --   BUN 23 22   < > 30*   < > 33* 33* 30*   < > 21 20 20   CREATININE 1.16 1.30*   < > 2.16*   < > 2.52* 2.47* 2.29*   < > 1.3 1.0 1.3  CALCIUM 8.5* 8.7*   < > 8.7*   < > 8.6* 8.5* 8.6*   < > 8.5* 8.6* 9.0  MG 1.9 1.9  --  2.1  --   --   --   --   --   --   --   --   PHOS  --   --   --  4.9*  --   --   --   --   --   --   --   --    < > = values in this interval not displayed.   Recent Labs  03/16/21 0320 03/19/21 2115 03/20/21 0429 10/05/21 0000 11/05/21 0000  AST 13* 11* 10* 12* 9*  ALT ALKPHOS 41 44 44 106 107  BILITOT 0.5 0.4 0.7  --   --   PROT 5.0* 5.4* 5.6*  --   --   ALBUMIN 2.0* 2.0* 2.0* 3.3* 3.5   Recent Labs    03/22/21 0445 03/23/21 0500 03/25/21 0908 03/29/21 0000 10/05/21 0000 10/12/21 0000 11/05/21 0000 12/23/21 0000 02/02/22 0000  WBC 13.4* 13.7* 11.0*   < > 7.5 8.0 7.6 6.1 6.5  NEUTROABS 10.8* 11.0* 8.8*  --  4.50 5.60 4.70  --   --   HGB 9.6* 9.9* 10.7*   < > 12.3* 13.0* 12.0* 12.9* 12.8*  HCT 30.3* 31.4* 35.7*   < > 38* 38* 37* 37* 38*  MCV 84.4 84.2 88.1  --   --   --   --   --   --   PLT 440* 464* 481*   < > 284 300 223 178 222   < > = values in this interval not displayed.   Lab  Results  Component Value Date   TSH 5.00 01/23/2022   Lab Results  Component Value Date   HGBA1C 5.3 03/11/2021   Lab Results  Component Value Date   CHOL 138 02/02/2022   HDL 26 (A) 02/02/2022   LDLCALC 73 02/02/2022   TRIG 194 (A) 02/02/2022   CHOLHDL 3.6 10/28/2018    Significant Diagnostic Results in last 30 days:  No results found.  Assessment/Plan  1. Acquired hypothyroidism Lab Results  Component Value Date   TSH 5.00 01/23/2022   -   Continue levothyroxine  2. Persistent atrial fibrillation (HCC) -   Rate controlled, continue diltiazem for rate control and Eliquis for anticoagulation  3. Urinary retention -   Stable, continue tamsulosin  4. Seizure-like activity (HCC) -   Stable, continue divalproex DR -   Seizure precautions  5. Hypotension, unspecified hypotension type -   Stable, continue midodrine  6. Vascular dementia without behavioral disturbance (HCC) -   Has severe cognitive impairment, continue supportive care    Family/ staff Communication: Discussed plan of care with resident and charge nurse.  Labs/tests ordered:   None    Kenard Gower, DNP, MSN, FNP-BC [facility] [phone] (Monday-Friday 8:00 a.m. - 5:00 p.m.) [phone] (after hours)

## 2022-02-20 DIAGNOSIS — I129 Hypertensive chronic kidney disease with stage 1 through stage 4 chronic kidney disease, or unspecified chronic kidney disease: Secondary | ICD-10-CM | POA: Diagnosis not present

## 2022-02-20 DIAGNOSIS — M6259 Muscle wasting and atrophy, not elsewhere classified, multiple sites: Secondary | ICD-10-CM | POA: Diagnosis not present

## 2022-02-21 DIAGNOSIS — M6259 Muscle wasting and atrophy, not elsewhere classified, multiple sites: Secondary | ICD-10-CM | POA: Diagnosis not present

## 2022-02-21 DIAGNOSIS — I129 Hypertensive chronic kidney disease with stage 1 through stage 4 chronic kidney disease, or unspecified chronic kidney disease: Secondary | ICD-10-CM | POA: Diagnosis not present

## 2022-02-22 DIAGNOSIS — I129 Hypertensive chronic kidney disease with stage 1 through stage 4 chronic kidney disease, or unspecified chronic kidney disease: Secondary | ICD-10-CM | POA: Diagnosis not present

## 2022-02-22 DIAGNOSIS — M6259 Muscle wasting and atrophy, not elsewhere classified, multiple sites: Secondary | ICD-10-CM | POA: Diagnosis not present

## 2022-02-23 DIAGNOSIS — M6259 Muscle wasting and atrophy, not elsewhere classified, multiple sites: Secondary | ICD-10-CM | POA: Diagnosis not present

## 2022-02-23 DIAGNOSIS — I129 Hypertensive chronic kidney disease with stage 1 through stage 4 chronic kidney disease, or unspecified chronic kidney disease: Secondary | ICD-10-CM | POA: Diagnosis not present

## 2022-02-24 DIAGNOSIS — M6259 Muscle wasting and atrophy, not elsewhere classified, multiple sites: Secondary | ICD-10-CM | POA: Diagnosis not present

## 2022-02-24 DIAGNOSIS — I129 Hypertensive chronic kidney disease with stage 1 through stage 4 chronic kidney disease, or unspecified chronic kidney disease: Secondary | ICD-10-CM | POA: Diagnosis not present

## 2022-02-27 DIAGNOSIS — M6259 Muscle wasting and atrophy, not elsewhere classified, multiple sites: Secondary | ICD-10-CM | POA: Diagnosis not present

## 2022-02-27 DIAGNOSIS — I129 Hypertensive chronic kidney disease with stage 1 through stage 4 chronic kidney disease, or unspecified chronic kidney disease: Secondary | ICD-10-CM | POA: Diagnosis not present

## 2022-02-28 DIAGNOSIS — M6259 Muscle wasting and atrophy, not elsewhere classified, multiple sites: Secondary | ICD-10-CM | POA: Diagnosis not present

## 2022-02-28 DIAGNOSIS — I129 Hypertensive chronic kidney disease with stage 1 through stage 4 chronic kidney disease, or unspecified chronic kidney disease: Secondary | ICD-10-CM | POA: Diagnosis not present

## 2022-02-28 DIAGNOSIS — Z66 Do not resuscitate: Secondary | ICD-10-CM | POA: Insufficient documentation

## 2022-03-01 DIAGNOSIS — M6259 Muscle wasting and atrophy, not elsewhere classified, multiple sites: Secondary | ICD-10-CM | POA: Diagnosis not present

## 2022-03-01 DIAGNOSIS — I129 Hypertensive chronic kidney disease with stage 1 through stage 4 chronic kidney disease, or unspecified chronic kidney disease: Secondary | ICD-10-CM | POA: Diagnosis not present

## 2022-03-02 DIAGNOSIS — M6259 Muscle wasting and atrophy, not elsewhere classified, multiple sites: Secondary | ICD-10-CM | POA: Diagnosis not present

## 2022-03-02 DIAGNOSIS — I129 Hypertensive chronic kidney disease with stage 1 through stage 4 chronic kidney disease, or unspecified chronic kidney disease: Secondary | ICD-10-CM | POA: Diagnosis not present

## 2022-03-03 DIAGNOSIS — I129 Hypertensive chronic kidney disease with stage 1 through stage 4 chronic kidney disease, or unspecified chronic kidney disease: Secondary | ICD-10-CM | POA: Diagnosis not present

## 2022-03-03 DIAGNOSIS — M6259 Muscle wasting and atrophy, not elsewhere classified, multiple sites: Secondary | ICD-10-CM | POA: Diagnosis not present

## 2022-03-06 DIAGNOSIS — I129 Hypertensive chronic kidney disease with stage 1 through stage 4 chronic kidney disease, or unspecified chronic kidney disease: Secondary | ICD-10-CM | POA: Diagnosis not present

## 2022-03-06 DIAGNOSIS — M6259 Muscle wasting and atrophy, not elsewhere classified, multiple sites: Secondary | ICD-10-CM | POA: Diagnosis not present

## 2022-03-07 DIAGNOSIS — I129 Hypertensive chronic kidney disease with stage 1 through stage 4 chronic kidney disease, or unspecified chronic kidney disease: Secondary | ICD-10-CM | POA: Diagnosis not present

## 2022-03-07 DIAGNOSIS — M6259 Muscle wasting and atrophy, not elsewhere classified, multiple sites: Secondary | ICD-10-CM | POA: Diagnosis not present

## 2022-03-08 DIAGNOSIS — I129 Hypertensive chronic kidney disease with stage 1 through stage 4 chronic kidney disease, or unspecified chronic kidney disease: Secondary | ICD-10-CM | POA: Diagnosis not present

## 2022-03-08 DIAGNOSIS — M6259 Muscle wasting and atrophy, not elsewhere classified, multiple sites: Secondary | ICD-10-CM | POA: Diagnosis not present

## 2022-03-08 DIAGNOSIS — F32A Depression, unspecified: Secondary | ICD-10-CM | POA: Diagnosis not present

## 2022-03-09 DIAGNOSIS — I129 Hypertensive chronic kidney disease with stage 1 through stage 4 chronic kidney disease, or unspecified chronic kidney disease: Secondary | ICD-10-CM | POA: Diagnosis not present

## 2022-03-09 DIAGNOSIS — M6259 Muscle wasting and atrophy, not elsewhere classified, multiple sites: Secondary | ICD-10-CM | POA: Diagnosis not present

## 2022-03-10 DIAGNOSIS — M6259 Muscle wasting and atrophy, not elsewhere classified, multiple sites: Secondary | ICD-10-CM | POA: Diagnosis not present

## 2022-03-10 DIAGNOSIS — I129 Hypertensive chronic kidney disease with stage 1 through stage 4 chronic kidney disease, or unspecified chronic kidney disease: Secondary | ICD-10-CM | POA: Diagnosis not present

## 2022-03-13 DIAGNOSIS — I129 Hypertensive chronic kidney disease with stage 1 through stage 4 chronic kidney disease, or unspecified chronic kidney disease: Secondary | ICD-10-CM | POA: Diagnosis not present

## 2022-03-13 DIAGNOSIS — M6259 Muscle wasting and atrophy, not elsewhere classified, multiple sites: Secondary | ICD-10-CM | POA: Diagnosis not present

## 2022-03-14 DIAGNOSIS — I129 Hypertensive chronic kidney disease with stage 1 through stage 4 chronic kidney disease, or unspecified chronic kidney disease: Secondary | ICD-10-CM | POA: Diagnosis not present

## 2022-03-14 DIAGNOSIS — M6259 Muscle wasting and atrophy, not elsewhere classified, multiple sites: Secondary | ICD-10-CM | POA: Diagnosis not present

## 2022-03-15 DIAGNOSIS — I129 Hypertensive chronic kidney disease with stage 1 through stage 4 chronic kidney disease, or unspecified chronic kidney disease: Secondary | ICD-10-CM | POA: Diagnosis not present

## 2022-03-15 DIAGNOSIS — M6259 Muscle wasting and atrophy, not elsewhere classified, multiple sites: Secondary | ICD-10-CM | POA: Diagnosis not present

## 2022-03-16 DIAGNOSIS — M6259 Muscle wasting and atrophy, not elsewhere classified, multiple sites: Secondary | ICD-10-CM | POA: Diagnosis not present

## 2022-03-16 DIAGNOSIS — I129 Hypertensive chronic kidney disease with stage 1 through stage 4 chronic kidney disease, or unspecified chronic kidney disease: Secondary | ICD-10-CM | POA: Diagnosis not present

## 2022-03-21 DIAGNOSIS — B351 Tinea unguium: Secondary | ICD-10-CM | POA: Diagnosis not present

## 2022-03-21 DIAGNOSIS — I739 Peripheral vascular disease, unspecified: Secondary | ICD-10-CM | POA: Diagnosis not present

## 2022-03-21 DIAGNOSIS — L603 Nail dystrophy: Secondary | ICD-10-CM | POA: Diagnosis not present

## 2022-03-27 DIAGNOSIS — I959 Hypotension, unspecified: Secondary | ICD-10-CM | POA: Diagnosis not present

## 2022-04-05 DIAGNOSIS — Z8701 Personal history of pneumonia (recurrent): Secondary | ICD-10-CM | POA: Diagnosis not present

## 2022-04-05 DIAGNOSIS — R1312 Dysphagia, oropharyngeal phase: Secondary | ICD-10-CM | POA: Diagnosis not present

## 2022-04-06 DIAGNOSIS — Z8701 Personal history of pneumonia (recurrent): Secondary | ICD-10-CM | POA: Diagnosis not present

## 2022-04-06 DIAGNOSIS — I4891 Unspecified atrial fibrillation: Secondary | ICD-10-CM | POA: Diagnosis not present

## 2022-04-06 DIAGNOSIS — R1312 Dysphagia, oropharyngeal phase: Secondary | ICD-10-CM | POA: Diagnosis not present

## 2022-04-06 DIAGNOSIS — G9341 Metabolic encephalopathy: Secondary | ICD-10-CM | POA: Diagnosis not present

## 2022-04-06 DIAGNOSIS — A419 Sepsis, unspecified organism: Secondary | ICD-10-CM | POA: Diagnosis not present

## 2022-04-07 DIAGNOSIS — R1312 Dysphagia, oropharyngeal phase: Secondary | ICD-10-CM | POA: Diagnosis not present

## 2022-04-07 DIAGNOSIS — Z8701 Personal history of pneumonia (recurrent): Secondary | ICD-10-CM | POA: Diagnosis not present

## 2022-04-10 DIAGNOSIS — Z8701 Personal history of pneumonia (recurrent): Secondary | ICD-10-CM | POA: Diagnosis not present

## 2022-04-10 DIAGNOSIS — R1312 Dysphagia, oropharyngeal phase: Secondary | ICD-10-CM | POA: Diagnosis not present

## 2022-04-11 DIAGNOSIS — Z8701 Personal history of pneumonia (recurrent): Secondary | ICD-10-CM | POA: Diagnosis not present

## 2022-04-11 DIAGNOSIS — R1312 Dysphagia, oropharyngeal phase: Secondary | ICD-10-CM | POA: Diagnosis not present

## 2022-04-12 DIAGNOSIS — R1312 Dysphagia, oropharyngeal phase: Secondary | ICD-10-CM | POA: Diagnosis not present

## 2022-04-12 DIAGNOSIS — Z8701 Personal history of pneumonia (recurrent): Secondary | ICD-10-CM | POA: Diagnosis not present

## 2022-04-13 DIAGNOSIS — R1312 Dysphagia, oropharyngeal phase: Secondary | ICD-10-CM | POA: Diagnosis not present

## 2022-04-13 DIAGNOSIS — Z8701 Personal history of pneumonia (recurrent): Secondary | ICD-10-CM | POA: Diagnosis not present

## 2022-04-14 DIAGNOSIS — R1312 Dysphagia, oropharyngeal phase: Secondary | ICD-10-CM | POA: Diagnosis not present

## 2022-04-14 DIAGNOSIS — Z8701 Personal history of pneumonia (recurrent): Secondary | ICD-10-CM | POA: Diagnosis not present

## 2022-04-17 DIAGNOSIS — Z8701 Personal history of pneumonia (recurrent): Secondary | ICD-10-CM | POA: Diagnosis not present

## 2022-04-17 DIAGNOSIS — R1312 Dysphagia, oropharyngeal phase: Secondary | ICD-10-CM | POA: Diagnosis not present

## 2022-04-18 DIAGNOSIS — Z8701 Personal history of pneumonia (recurrent): Secondary | ICD-10-CM | POA: Diagnosis not present

## 2022-04-18 DIAGNOSIS — R1312 Dysphagia, oropharyngeal phase: Secondary | ICD-10-CM | POA: Diagnosis not present

## 2022-04-19 DIAGNOSIS — R1312 Dysphagia, oropharyngeal phase: Secondary | ICD-10-CM | POA: Diagnosis not present

## 2022-04-19 DIAGNOSIS — Z8701 Personal history of pneumonia (recurrent): Secondary | ICD-10-CM | POA: Diagnosis not present

## 2022-04-21 DIAGNOSIS — Z8701 Personal history of pneumonia (recurrent): Secondary | ICD-10-CM | POA: Diagnosis not present

## 2022-04-21 DIAGNOSIS — R1312 Dysphagia, oropharyngeal phase: Secondary | ICD-10-CM | POA: Diagnosis not present

## 2022-04-24 DIAGNOSIS — R9431 Abnormal electrocardiogram [ECG] [EKG]: Secondary | ICD-10-CM | POA: Diagnosis not present

## 2022-04-24 DIAGNOSIS — R1312 Dysphagia, oropharyngeal phase: Secondary | ICD-10-CM | POA: Diagnosis not present

## 2022-04-24 DIAGNOSIS — Z8701 Personal history of pneumonia (recurrent): Secondary | ICD-10-CM | POA: Diagnosis not present

## 2022-04-24 DIAGNOSIS — R079 Chest pain, unspecified: Secondary | ICD-10-CM | POA: Diagnosis not present

## 2022-04-25 DIAGNOSIS — R1312 Dysphagia, oropharyngeal phase: Secondary | ICD-10-CM | POA: Diagnosis not present

## 2022-04-25 DIAGNOSIS — I959 Hypotension, unspecified: Secondary | ICD-10-CM | POA: Diagnosis not present

## 2022-04-25 DIAGNOSIS — Z8701 Personal history of pneumonia (recurrent): Secondary | ICD-10-CM | POA: Diagnosis not present

## 2022-04-26 DIAGNOSIS — R079 Chest pain, unspecified: Secondary | ICD-10-CM | POA: Diagnosis not present

## 2022-04-26 DIAGNOSIS — R001 Bradycardia, unspecified: Secondary | ICD-10-CM | POA: Diagnosis not present

## 2022-04-26 DIAGNOSIS — Z8701 Personal history of pneumonia (recurrent): Secondary | ICD-10-CM | POA: Diagnosis not present

## 2022-04-26 DIAGNOSIS — R1312 Dysphagia, oropharyngeal phase: Secondary | ICD-10-CM | POA: Diagnosis not present

## 2022-04-27 DIAGNOSIS — R1312 Dysphagia, oropharyngeal phase: Secondary | ICD-10-CM | POA: Diagnosis not present

## 2022-04-27 DIAGNOSIS — Z8701 Personal history of pneumonia (recurrent): Secondary | ICD-10-CM | POA: Diagnosis not present

## 2022-04-28 DIAGNOSIS — R1312 Dysphagia, oropharyngeal phase: Secondary | ICD-10-CM | POA: Diagnosis not present

## 2022-04-28 DIAGNOSIS — Z8701 Personal history of pneumonia (recurrent): Secondary | ICD-10-CM | POA: Diagnosis not present

## 2022-05-01 DIAGNOSIS — Z8701 Personal history of pneumonia (recurrent): Secondary | ICD-10-CM | POA: Diagnosis not present

## 2022-05-01 DIAGNOSIS — R1312 Dysphagia, oropharyngeal phase: Secondary | ICD-10-CM | POA: Diagnosis not present

## 2022-05-02 DIAGNOSIS — M6281 Muscle weakness (generalized): Secondary | ICD-10-CM | POA: Diagnosis not present

## 2022-05-02 DIAGNOSIS — Z8701 Personal history of pneumonia (recurrent): Secondary | ICD-10-CM | POA: Diagnosis not present

## 2022-05-02 DIAGNOSIS — I5022 Chronic systolic (congestive) heart failure: Secondary | ICD-10-CM | POA: Diagnosis not present

## 2022-05-02 DIAGNOSIS — R1312 Dysphagia, oropharyngeal phase: Secondary | ICD-10-CM | POA: Diagnosis not present

## 2022-05-30 DIAGNOSIS — R1312 Dysphagia, oropharyngeal phase: Secondary | ICD-10-CM | POA: Diagnosis not present

## 2022-05-30 DIAGNOSIS — M6281 Muscle weakness (generalized): Secondary | ICD-10-CM | POA: Diagnosis not present

## 2022-05-30 DIAGNOSIS — Z8701 Personal history of pneumonia (recurrent): Secondary | ICD-10-CM | POA: Diagnosis not present

## 2022-05-30 DIAGNOSIS — I5022 Chronic systolic (congestive) heart failure: Secondary | ICD-10-CM | POA: Diagnosis not present

## 2022-05-31 DIAGNOSIS — R1312 Dysphagia, oropharyngeal phase: Secondary | ICD-10-CM | POA: Diagnosis not present

## 2022-05-31 DIAGNOSIS — I5022 Chronic systolic (congestive) heart failure: Secondary | ICD-10-CM | POA: Diagnosis not present

## 2022-05-31 DIAGNOSIS — Z8701 Personal history of pneumonia (recurrent): Secondary | ICD-10-CM | POA: Diagnosis not present

## 2022-05-31 DIAGNOSIS — M6281 Muscle weakness (generalized): Secondary | ICD-10-CM | POA: Diagnosis not present

## 2022-06-01 DIAGNOSIS — M6281 Muscle weakness (generalized): Secondary | ICD-10-CM | POA: Diagnosis not present

## 2022-06-01 DIAGNOSIS — R1312 Dysphagia, oropharyngeal phase: Secondary | ICD-10-CM | POA: Diagnosis not present

## 2022-06-01 DIAGNOSIS — I5022 Chronic systolic (congestive) heart failure: Secondary | ICD-10-CM | POA: Diagnosis not present

## 2022-06-01 DIAGNOSIS — Z8701 Personal history of pneumonia (recurrent): Secondary | ICD-10-CM | POA: Diagnosis not present

## 2022-06-02 DIAGNOSIS — I5022 Chronic systolic (congestive) heart failure: Secondary | ICD-10-CM | POA: Diagnosis not present

## 2022-06-02 DIAGNOSIS — M6281 Muscle weakness (generalized): Secondary | ICD-10-CM | POA: Diagnosis not present

## 2022-06-05 DIAGNOSIS — I5022 Chronic systolic (congestive) heart failure: Secondary | ICD-10-CM | POA: Diagnosis not present

## 2022-06-05 DIAGNOSIS — M6281 Muscle weakness (generalized): Secondary | ICD-10-CM | POA: Diagnosis not present

## 2022-06-06 DIAGNOSIS — M6281 Muscle weakness (generalized): Secondary | ICD-10-CM | POA: Diagnosis not present

## 2022-06-06 DIAGNOSIS — I5022 Chronic systolic (congestive) heart failure: Secondary | ICD-10-CM | POA: Diagnosis not present

## 2022-06-07 DIAGNOSIS — M6281 Muscle weakness (generalized): Secondary | ICD-10-CM | POA: Diagnosis not present

## 2022-06-07 DIAGNOSIS — I5022 Chronic systolic (congestive) heart failure: Secondary | ICD-10-CM | POA: Diagnosis not present

## 2022-06-08 DIAGNOSIS — I5022 Chronic systolic (congestive) heart failure: Secondary | ICD-10-CM | POA: Diagnosis not present

## 2022-06-08 DIAGNOSIS — M6281 Muscle weakness (generalized): Secondary | ICD-10-CM | POA: Diagnosis not present

## 2022-06-09 DIAGNOSIS — I5022 Chronic systolic (congestive) heart failure: Secondary | ICD-10-CM | POA: Diagnosis not present

## 2022-06-09 DIAGNOSIS — M6281 Muscle weakness (generalized): Secondary | ICD-10-CM | POA: Diagnosis not present

## 2022-06-12 DIAGNOSIS — I5022 Chronic systolic (congestive) heart failure: Secondary | ICD-10-CM | POA: Diagnosis not present

## 2022-06-12 DIAGNOSIS — M6281 Muscle weakness (generalized): Secondary | ICD-10-CM | POA: Diagnosis not present

## 2022-06-13 DIAGNOSIS — M6281 Muscle weakness (generalized): Secondary | ICD-10-CM | POA: Diagnosis not present

## 2022-06-13 DIAGNOSIS — I5022 Chronic systolic (congestive) heart failure: Secondary | ICD-10-CM | POA: Diagnosis not present

## 2022-06-14 DIAGNOSIS — I5022 Chronic systolic (congestive) heart failure: Secondary | ICD-10-CM | POA: Diagnosis not present

## 2022-06-14 DIAGNOSIS — M6281 Muscle weakness (generalized): Secondary | ICD-10-CM | POA: Diagnosis not present

## 2022-06-15 DIAGNOSIS — M6281 Muscle weakness (generalized): Secondary | ICD-10-CM | POA: Diagnosis not present

## 2022-06-15 DIAGNOSIS — I5022 Chronic systolic (congestive) heart failure: Secondary | ICD-10-CM | POA: Diagnosis not present

## 2022-06-16 DIAGNOSIS — I5022 Chronic systolic (congestive) heart failure: Secondary | ICD-10-CM | POA: Diagnosis not present

## 2022-06-16 DIAGNOSIS — M6281 Muscle weakness (generalized): Secondary | ICD-10-CM | POA: Diagnosis not present

## 2022-06-19 DIAGNOSIS — M6281 Muscle weakness (generalized): Secondary | ICD-10-CM | POA: Diagnosis not present

## 2022-06-19 DIAGNOSIS — I5022 Chronic systolic (congestive) heart failure: Secondary | ICD-10-CM | POA: Diagnosis not present

## 2022-06-20 DIAGNOSIS — M6281 Muscle weakness (generalized): Secondary | ICD-10-CM | POA: Diagnosis not present

## 2022-06-20 DIAGNOSIS — I5022 Chronic systolic (congestive) heart failure: Secondary | ICD-10-CM | POA: Diagnosis not present

## 2022-06-21 DIAGNOSIS — I5022 Chronic systolic (congestive) heart failure: Secondary | ICD-10-CM | POA: Diagnosis not present

## 2022-06-21 DIAGNOSIS — M6281 Muscle weakness (generalized): Secondary | ICD-10-CM | POA: Diagnosis not present

## 2022-06-22 DIAGNOSIS — R1312 Dysphagia, oropharyngeal phase: Secondary | ICD-10-CM | POA: Diagnosis not present

## 2022-06-22 DIAGNOSIS — R41841 Cognitive communication deficit: Secondary | ICD-10-CM | POA: Diagnosis not present

## 2022-06-22 DIAGNOSIS — I4891 Unspecified atrial fibrillation: Secondary | ICD-10-CM | POA: Diagnosis not present

## 2022-06-22 DIAGNOSIS — R2681 Unsteadiness on feet: Secondary | ICD-10-CM | POA: Diagnosis not present

## 2022-06-22 DIAGNOSIS — M6281 Muscle weakness (generalized): Secondary | ICD-10-CM | POA: Diagnosis not present

## 2022-06-22 DIAGNOSIS — I5022 Chronic systolic (congestive) heart failure: Secondary | ICD-10-CM | POA: Diagnosis not present

## 2022-06-23 DIAGNOSIS — I5022 Chronic systolic (congestive) heart failure: Secondary | ICD-10-CM | POA: Diagnosis not present

## 2022-06-23 DIAGNOSIS — M6281 Muscle weakness (generalized): Secondary | ICD-10-CM | POA: Diagnosis not present

## 2022-06-30 DIAGNOSIS — F32A Depression, unspecified: Secondary | ICD-10-CM | POA: Diagnosis not present

## 2022-07-07 DIAGNOSIS — B351 Tinea unguium: Secondary | ICD-10-CM | POA: Diagnosis not present

## 2022-07-07 DIAGNOSIS — L603 Nail dystrophy: Secondary | ICD-10-CM | POA: Diagnosis not present

## 2022-07-07 DIAGNOSIS — I739 Peripheral vascular disease, unspecified: Secondary | ICD-10-CM | POA: Diagnosis not present

## 2022-07-27 ENCOUNTER — Ambulatory Visit: Payer: Medicare Other | Attending: Cardiology | Admitting: Cardiology

## 2022-07-27 ENCOUNTER — Encounter: Payer: Self-pay | Admitting: Cardiology

## 2022-07-27 VITALS — BP 120/82 | HR 54 | Ht 72.0 in | Wt 200.0 lb

## 2022-07-27 DIAGNOSIS — I4819 Other persistent atrial fibrillation: Secondary | ICD-10-CM

## 2022-07-27 DIAGNOSIS — Z79899 Other long term (current) drug therapy: Secondary | ICD-10-CM

## 2022-07-27 DIAGNOSIS — D6869 Other thrombophilia: Secondary | ICD-10-CM

## 2022-07-27 NOTE — Patient Instructions (Signed)
Medication Instructions:  Your physician recommends that you continue on your current medications as directed. Please refer to the Current Medication list given to you today.  *If you need a refill on your cardiac medications before your next appointment, please call your pharmacy*   Lab Work: Today: TSH & LFTs  (Amiodarone surveillance)  If you have labs (blood work) drawn today and your tests are completely normal, you will receive your results only by: [place] (if you have MyChart) OR A paper copy in the mail If you have any lab test that is abnormal or we need to change your treatment, we will call you to review the results.   Testing/Procedures: None ordered   Follow-Up: At [facility], you and your health needs are our priority.  As part of our continuing mission to provide you with exceptional heart care, we have created designated Provider Care Teams.  These Care Teams include your primary Cardiologist (physician) and Advanced Practice Providers (APPs -  Physician Assistants and Nurse Practitioners) who all work together to provide you with the care you need, when you need it.  Your next appointment:   6 month(s)  The format for your next appointment:   In Person  Provider:   Legrand Como "Jonni Sanger" Chalmers Cater, PA-C    Thank you for choosing [facility]!!   Trinidad Curet, RN [phone]  Other Instructions   Important Information About Sugar

## 2022-07-27 NOTE — Progress Notes (Signed)
Electrophysiology Office Note   Date:  07/27/2022   ID:  Jeffery, Christian 03/18/1951, MRN [id number]  PCP:  Elmore Guise, MD  Cardiologist:   Primary Electrophysiologist: Helem Reesor Meredith Leeds, MD    Chief Complaint: AF   History of Present Illness: Jeffery Christian is a 71 y.o. male who is being seen today for the evaluation of AF at the request of [name], Satira Anis, MD. Presenting today for electrophysiology evaluation.  He has a history significant for CKD stage III and persistent atrial fibrillation.  He was hospitalized August 2022 after being found down by friends.  Hospital course complicated by [id number] infection, acute on chronic kidney disease, hyperkalemia, metabolic encephalopathy.  Multiple hospitalizations and emergency room visits for various issues including UTIs and sepsis.  He has been working with physical therapy at his nursing facility.  He feels short of breath and fatigue and was started on amiodarone.  He is now post cardioversion for atrial fibrillation.  Today, denies symptoms of palpitations, chest pain, shortness of breath, orthopnea, PND, lower extremity edema, claudication, dizziness, presyncope, syncope, bleeding, or neurologic sequela. The patient is tolerating medications without difficulties.  Since being seen he has done well.  He has had no further episodes of atrial fibrillation.  He continues to work in rehab.  His goal is to get walking again.    Past Medical History:  Diagnosis Date   Chronic kidney disease    Chronic systolic CHF (congestive heart failure) (HCC)    EF 35-40, diffuse HK, mild MR, moderate LAE, mild RAE, PASP 44, L pleural eff   Dilated cardiomyopathy (HCC)    likely related to tachycardia   Dysrhythmia    Persistent atrial fibrillation [facility])    Past Surgical History:  Procedure Laterality Date   CARDIOVERSION N/A 08/20/2017   Procedure: CARDIOVERSION;  Surgeon: Jerline Pain, MD;  Location: [place];  Service:  Cardiovascular;  Laterality: N/A;   CARDIOVERSION N/A 11/18/2021   Procedure: CARDIOVERSION;  Surgeon: [name], MD;  Location: [place];  Service: Cardiovascular;  Laterality: N/A;   INCISION AND DRAINAGE ABSCESS Left 03/22/2014   Procedure: INCISION AND DRAINAGE ABSCESS LEFT BUTTOCK ABSCESS;  Surgeon: Zenovia Jarred, MD;  Location: [place];  Service: General;  Laterality: Left;     Current Outpatient Medications  Medication Sig Dispense Refill   allopurinol (ZYLOPRIM) 300 MG tablet Take 150 mg by mouth daily. (0800)     amiodarone (PACERONE) 200 MG tablet Take 200 mg by mouth daily. (0800)     apixaban (ELIQUIS) 5 MG TABS tablet Take 1 tablet (5 mg total) by mouth 2 (two) times daily.     atorvastatin (LIPITOR) 20 MG tablet Take 1 tablet (20 mg total) by mouth every evening.     Cholecalciferol (VITAMIN D) 50 MCG (2000 UT) tablet Take 2,000 Units by mouth daily. (0900)     colchicine 0.6 MG tablet Take by mouth as directed. Take 2 tablets (1.2 mg) by mouth at the first sign of a gout flare up and then take one tablet in one hour if symptoms continue     diltiazem (CARDIZEM) 90 MG tablet Take 90 mg by mouth every 8 (eight) hours. (0600, 1400 & 2200) Hold for SBP < 100 and Heart rate < 60     divalproex (DEPAKOTE SPRINKLE) 125 MG capsule Take 125 mg by mouth 3 (three) times daily. (0800, 1200 & 2000)     levothyroxine (SYNTHROID) 25 MCG tablet Take 25 mcg by  mouth daily before breakfast.     midodrine (PROAMATINE) 5 MG tablet Take 5 mg by mouth 3 (three) times daily. (1000, 1400 & 2000)     NON FORMULARY Diet: Regular/thin diet consistency     pantoprazole (PROTONIX) 40 MG tablet Take 1 tablet (40 mg total) by mouth daily.     polyethylene glycol (MIRALAX / GLYCOLAX) 17 g packet Take 17 g by mouth daily. (0800)     sertraline (ZOLOFT) 50 MG tablet Take 50 mg by mouth daily. At bedtime for sleep     tamsulosin (FLOMAX) 0.4 MG CAPS capsule Take 1 capsule (0.4 mg total) by mouth daily  after supper. 30 capsule    No current facility-administered medications for this visit.    Allergies:   Patient has no known allergies.   Social History:  The patient  reports that he has never smoked. He has never used smokeless tobacco. He reports that he does not currently use alcohol. He reports that he does not use drugs.   Family History:  The patient's family history includes Cancer in his mother; Leukemia in his father.   ROS:  Please see the history of present illness.   Otherwise, review of systems is positive for none.   All other systems are reviewed and negative.   PHYSICAL EXAM: VS:  BP 120/82   Pulse (!) 54   Ht 6' (1.829 m)   Wt 200 lb (90.7 kg)   SpO2 98%   BMI 27.12 kg/m  , BMI Body mass index is 27.12 kg/m. GEN: Well nourished, well developed, in no acute distress  HEENT: normal  Neck: no JVD, carotid bruits, or masses Cardiac: RRR; no murmurs, rubs, or gallops,no edema  Respiratory:  clear to auscultation bilaterally, normal work of breathing GI: soft, nontender, nondistended, + BS MS: no deformity or atrophy  Skin: warm and dry Neuro:  Strength and sensation are intact Psych: euthymic mood, full affect  EKG:  EKG is ordered today. Personal review of the ekg ordered shows sinus rhythm, inferior infarct, rate 54  Recent Labs: 11/05/2021: ALT 10 01/23/2022: TSH 5.00 02/02/2022: BUN 20; Creatinine 1.3; Hemoglobin 12.8; Platelets 222; Potassium 3.7; Sodium 140    Lipid Panel     Component Value Date/Time   CHOL 138 02/02/2022 0000   CHOL 139 10/28/2018 1014   TRIG 194 (A) 02/02/2022 0000   HDL 26 (A) 02/02/2022 0000   HDL 39 (L) 10/28/2018 1014   CHOLHDL 3.6 10/28/2018 1014   LDLCALC 73 02/02/2022 0000   LDLCALC 65 10/28/2018 1014     Wt Readings from Last 3 Encounters:  07/27/22 200 lb (90.7 kg)  02/17/22 205 lb 6.4 oz (93.2 kg)  02/01/22 205 lb 6.4 oz (93.2 kg)      Other studies Reviewed: Additional studies/ records that were reviewed  today include: TTE 11/10/20  Review of the above records today demonstrates:   1. Left ventricular ejection fraction, by estimation, is 60 to 65%. The  left ventricle has normal function. The left ventricle has no regional  wall motion abnormalities. There is moderate left ventricular hypertrophy.  Left ventricular diastolic  parameters are indeterminate.   2. Right ventricular systolic function is normal. The right ventricular  size is normal.   3. Left atrial size was mildly dilated.   4. The mitral valve is normal in structure. Trivial mitral valve  regurgitation. No evidence of mitral stenosis.   5. The aortic valve was not well visualized. Aortic valve regurgitation  is not visualized. No aortic stenosis is present.   6. The inferior vena cava is dilated in size with >50% respiratory  variability, suggesting right atrial pressure of 8 mmHg.    ASSESSMENT AND PLAN:  1.  Persistent atrial fibrillation: Currently on Eliquis 5 mg twice daily, amiodarone 200 mg daily.  CHA2DS2-VASc of 2.  High risk medication monitoring for amiodarone.  He is fortunately remained in sinus rhythm without any evidence of atrial fibrillation.  As he is continuing to go through rehab, we Uzair Godley plan to continue current management.  We Maud Rubendall check amiodarone labs for high risk medication monitoring today.  2.  Secondary hypercoagulable state: Currently on Eliquis for atrial fibrillation as above.   Current medicines are reviewed at length with the patient today.   The patient does not have concerns regarding his medicines.  The following changes were made today: None  Labs/ tests ordered today include:  Orders Placed This Encounter  Procedures   TSH   Hepatic function panel   EKG 12-Lead     Disposition:   FU 6 months  Signed, Bula Cavalieri Meredith Leeds, MD  07/27/2022 12:11 PM     [facility] [address] [phone] (office) [phone] (fax)

## 2022-07-28 LAB — HEPATIC FUNCTION PANEL
ALT: 21 IU/L (ref 0–44)
AST: 19 IU/L (ref 0–40)
Albumin: 4.1 g/dL (ref 3.9–4.9)
Alkaline Phosphatase: 93 IU/L (ref 44–121)
Bilirubin Total: 0.2 mg/dL (ref 0.0–1.2)
Bilirubin, Direct: <0.1 mg/dL (ref 0.00–0.40)
Total Protein: 6.4 g/dL (ref 6.0–8.5)

## 2022-07-28 LAB — TSH: TSH: 7.88 u[IU]/mL — ABNORMAL HIGH (ref 0.450–4.500)

## 2022-07-31 DIAGNOSIS — Z23 Encounter for immunization: Secondary | ICD-10-CM | POA: Diagnosis not present

## 2022-08-25 DIAGNOSIS — I4891 Unspecified atrial fibrillation: Secondary | ICD-10-CM | POA: Diagnosis not present

## 2022-08-25 DIAGNOSIS — I5022 Chronic systolic (congestive) heart failure: Secondary | ICD-10-CM | POA: Diagnosis not present

## 2022-09-01 DIAGNOSIS — M6259 Muscle wasting and atrophy, not elsewhere classified, multiple sites: Secondary | ICD-10-CM | POA: Diagnosis not present

## 2022-09-01 DIAGNOSIS — R278 Other lack of coordination: Secondary | ICD-10-CM | POA: Diagnosis not present

## 2022-09-04 DIAGNOSIS — R278 Other lack of coordination: Secondary | ICD-10-CM | POA: Diagnosis not present

## 2022-09-04 DIAGNOSIS — M6259 Muscle wasting and atrophy, not elsewhere classified, multiple sites: Secondary | ICD-10-CM | POA: Diagnosis not present

## 2022-09-05 DIAGNOSIS — R278 Other lack of coordination: Secondary | ICD-10-CM | POA: Diagnosis not present

## 2022-09-05 DIAGNOSIS — M6259 Muscle wasting and atrophy, not elsewhere classified, multiple sites: Secondary | ICD-10-CM | POA: Diagnosis not present

## 2022-09-06 DIAGNOSIS — R278 Other lack of coordination: Secondary | ICD-10-CM | POA: Diagnosis not present

## 2022-09-06 DIAGNOSIS — M6259 Muscle wasting and atrophy, not elsewhere classified, multiple sites: Secondary | ICD-10-CM | POA: Diagnosis not present

## 2022-09-07 DIAGNOSIS — M6259 Muscle wasting and atrophy, not elsewhere classified, multiple sites: Secondary | ICD-10-CM | POA: Diagnosis not present

## 2022-09-07 DIAGNOSIS — L603 Nail dystrophy: Secondary | ICD-10-CM | POA: Diagnosis not present

## 2022-09-07 DIAGNOSIS — I739 Peripheral vascular disease, unspecified: Secondary | ICD-10-CM | POA: Diagnosis not present

## 2022-09-07 DIAGNOSIS — R278 Other lack of coordination: Secondary | ICD-10-CM | POA: Diagnosis not present

## 2022-09-08 DIAGNOSIS — R278 Other lack of coordination: Secondary | ICD-10-CM | POA: Diagnosis not present

## 2022-09-08 DIAGNOSIS — M6259 Muscle wasting and atrophy, not elsewhere classified, multiple sites: Secondary | ICD-10-CM | POA: Diagnosis not present

## 2022-09-10 DIAGNOSIS — M6259 Muscle wasting and atrophy, not elsewhere classified, multiple sites: Secondary | ICD-10-CM | POA: Diagnosis not present

## 2022-09-10 DIAGNOSIS — R278 Other lack of coordination: Secondary | ICD-10-CM | POA: Diagnosis not present

## 2022-09-11 DIAGNOSIS — M6259 Muscle wasting and atrophy, not elsewhere classified, multiple sites: Secondary | ICD-10-CM | POA: Diagnosis not present

## 2022-09-11 DIAGNOSIS — R278 Other lack of coordination: Secondary | ICD-10-CM | POA: Diagnosis not present

## 2022-09-12 DIAGNOSIS — L259 Unspecified contact dermatitis, unspecified cause: Secondary | ICD-10-CM | POA: Diagnosis not present

## 2022-09-12 DIAGNOSIS — M6259 Muscle wasting and atrophy, not elsewhere classified, multiple sites: Secondary | ICD-10-CM | POA: Diagnosis not present

## 2022-09-12 DIAGNOSIS — R278 Other lack of coordination: Secondary | ICD-10-CM | POA: Diagnosis not present

## 2022-09-13 DIAGNOSIS — R278 Other lack of coordination: Secondary | ICD-10-CM | POA: Diagnosis not present

## 2022-09-13 DIAGNOSIS — M6259 Muscle wasting and atrophy, not elsewhere classified, multiple sites: Secondary | ICD-10-CM | POA: Diagnosis not present

## 2022-09-15 DIAGNOSIS — R278 Other lack of coordination: Secondary | ICD-10-CM | POA: Diagnosis not present

## 2022-09-15 DIAGNOSIS — M6259 Muscle wasting and atrophy, not elsewhere classified, multiple sites: Secondary | ICD-10-CM | POA: Diagnosis not present

## 2022-09-18 DIAGNOSIS — M6259 Muscle wasting and atrophy, not elsewhere classified, multiple sites: Secondary | ICD-10-CM | POA: Diagnosis not present

## 2022-09-18 DIAGNOSIS — R278 Other lack of coordination: Secondary | ICD-10-CM | POA: Diagnosis not present

## 2022-09-19 DIAGNOSIS — R278 Other lack of coordination: Secondary | ICD-10-CM | POA: Diagnosis not present

## 2022-09-19 DIAGNOSIS — M6259 Muscle wasting and atrophy, not elsewhere classified, multiple sites: Secondary | ICD-10-CM | POA: Diagnosis not present

## 2022-09-20 DIAGNOSIS — R278 Other lack of coordination: Secondary | ICD-10-CM | POA: Diagnosis not present

## 2022-09-20 DIAGNOSIS — M6259 Muscle wasting and atrophy, not elsewhere classified, multiple sites: Secondary | ICD-10-CM | POA: Diagnosis not present

## 2022-09-21 DIAGNOSIS — M6259 Muscle wasting and atrophy, not elsewhere classified, multiple sites: Secondary | ICD-10-CM | POA: Diagnosis not present

## 2022-09-21 DIAGNOSIS — R278 Other lack of coordination: Secondary | ICD-10-CM | POA: Diagnosis not present

## 2022-09-22 DIAGNOSIS — R278 Other lack of coordination: Secondary | ICD-10-CM | POA: Diagnosis not present

## 2022-09-22 DIAGNOSIS — M6259 Muscle wasting and atrophy, not elsewhere classified, multiple sites: Secondary | ICD-10-CM | POA: Diagnosis not present

## 2022-09-26 DIAGNOSIS — J09X9 Influenza due to identified novel influenza A virus with other manifestations: Secondary | ICD-10-CM | POA: Diagnosis not present

## 2022-09-27 DIAGNOSIS — M6259 Muscle wasting and atrophy, not elsewhere classified, multiple sites: Secondary | ICD-10-CM | POA: Diagnosis not present

## 2022-09-27 DIAGNOSIS — R278 Other lack of coordination: Secondary | ICD-10-CM | POA: Diagnosis not present

## 2022-09-28 DIAGNOSIS — R278 Other lack of coordination: Secondary | ICD-10-CM | POA: Diagnosis not present

## 2022-09-28 DIAGNOSIS — M6259 Muscle wasting and atrophy, not elsewhere classified, multiple sites: Secondary | ICD-10-CM | POA: Diagnosis not present

## 2022-09-29 DIAGNOSIS — L259 Unspecified contact dermatitis, unspecified cause: Secondary | ICD-10-CM | POA: Diagnosis not present

## 2022-09-29 DIAGNOSIS — J09X9 Influenza due to identified novel influenza A virus with other manifestations: Secondary | ICD-10-CM | POA: Diagnosis not present

## 2022-09-29 DIAGNOSIS — R278 Other lack of coordination: Secondary | ICD-10-CM | POA: Diagnosis not present

## 2022-09-29 DIAGNOSIS — I4891 Unspecified atrial fibrillation: Secondary | ICD-10-CM | POA: Diagnosis not present

## 2022-09-29 DIAGNOSIS — M6259 Muscle wasting and atrophy, not elsewhere classified, multiple sites: Secondary | ICD-10-CM | POA: Diagnosis not present

## 2023-01-08 ENCOUNTER — Ambulatory Visit: Payer: Medicare Other | Attending: Cardiology | Admitting: Cardiology

## 2023-01-08 NOTE — Progress Notes (Deleted)
Electrophysiology Office Note   Date:  01/08/2023   ID:  Jeffery Christian, Jeffery Christian [birth year], MRN [id number]  PCP:  Jeffery Elk, MD  Cardiologist:   Primary Electrophysiologist: Dominik Yordy Jeffery Loa, MD    Chief Complaint: AF   History of Present Illness: Jeffery Christian is a 72 y.o. male who is being seen today for the evaluation of AF at the request of Jeffery, [name], MD. Presenting today for electrophysiology evaluation.  Has a history significant for CKD stage III and persistent atrial fibrillation.  He was hospitalized August 2022 after being found down by friends.  Hospital course was complicated by COVID-19 infection, acute on chronic kidney disease, hyperkalemia, metabolic encephalopathy.  Multiple hospitalizations and emergency room visits for various issues including UTIs and sepsis.  He has been working with physical therapy at his nursing facility.  He feels short of breath and fatigued and was started on amiodarone.  He is now post cardioversion for atrial fibrillation.  Today, denies symptoms of palpitations, chest pain, shortness of breath, orthopnea, PND, lower extremity edema, claudication, dizziness, presyncope, syncope, bleeding, or neurologic sequela. The patient is tolerating medications without difficulties. ***    Past Medical History:  Diagnosis Date   Chronic kidney disease    Chronic systolic CHF (congestive heart failure) (HCC)    EF 35-40, diffuse HK, mild MR, moderate LAE, mild RAE, PASP 44, L pleural eff   Dilated cardiomyopathy (HCC)    likely related to tachycardia   Dysrhythmia    Persistent atrial fibrillation [facility])    Past Surgical History:  Procedure Laterality Date   CARDIOVERSION N/A 08/20/2017   Procedure: CARDIOVERSION;  Surgeon: Jeffery Bathe, MD;  [facility];  Service: Cardiovascular;  Laterality: N/A;   CARDIOVERSION N/A 11/18/2021   Procedure: CARDIOVERSION;  Surgeon: Jeffery Nose, MD;  [facility];  Service:  Cardiovascular;  Laterality: N/A;   INCISION AND DRAINAGE ABSCESS Left 03/22/2014   Procedure: INCISION AND DRAINAGE ABSCESS LEFT BUTTOCK ABSCESS;  Surgeon: Jeffery Malady, MD;  [facility];  Service: General;  Laterality: Left;     Current Outpatient Medications  Medication Sig Dispense Refill   allopurinol (ZYLOPRIM) 300 MG tablet Take 150 mg by mouth daily. (0800)     amiodarone (PACERONE) 200 MG tablet Take 200 mg by mouth daily. (0800)     apixaban (ELIQUIS) 5 MG TABS tablet Take 1 tablet (5 mg total) by mouth 2 (two) times daily.     atorvastatin (LIPITOR) 20 MG tablet Take 1 tablet (20 mg total) by mouth every evening.     Cholecalciferol (VITAMIN D) 50 MCG (2000 UT) tablet Take 2,000 Units by mouth daily. (0900)     colchicine 0.6 MG tablet Take by mouth as directed. Take 2 tablets (1.2 mg) by mouth at the first sign of a gout flare up and then take one tablet in one hour if symptoms continue     diltiazem (CARDIZEM) 90 MG tablet Take 90 mg by mouth every 8 (eight) hours. (0600, 1400 & 2200) Hold for SBP < 100 and Heart rate < 60     divalproex (DEPAKOTE SPRINKLE) 125 MG capsule Take 125 mg by mouth 3 (three) times daily. (0800, 1200 & 2000)     levothyroxine (SYNTHROID) 25 MCG tablet Take 25 mcg by mouth daily before breakfast.     midodrine (PROAMATINE) 5 MG tablet Take 5 mg by mouth 3 (three) times daily. (1000, 1400 & 2000)     NON FORMULARY  Diet: Regular/thin diet consistency     pantoprazole (PROTONIX) 40 MG tablet Take 1 tablet (40 mg total) by mouth daily.     polyethylene glycol (MIRALAX / GLYCOLAX) 17 g packet Take 17 g by mouth daily. (0800)     sertraline (ZOLOFT) 50 MG tablet Take 50 mg by mouth daily. At bedtime for sleep     tamsulosin (FLOMAX) 0.4 MG CAPS capsule Take 1 capsule (0.4 mg total) by mouth daily after supper. 30 capsule    No current facility-administered medications for this visit.    Allergies:   Patient has no known allergies.   Social History:   The patient  reports that he has never smoked. He has never used smokeless tobacco. He reports that he does not currently use alcohol. He reports that he does not use drugs.   Family History:  The patient's family history includes Cancer in his mother; Leukemia in his father.   ROS:  Please see the history of present illness.   Otherwise, review of systems is positive for none.   All other systems are reviewed and negative.   PHYSICAL EXAM: VS:  There were no vitals taken for this visit. , BMI There is no height or weight on file to calculate BMI. GEN: Well nourished, well developed, in no acute distress  HEENT: normal  Neck: no JVD, carotid bruits, or masses Cardiac: ***RRR; no murmurs, rubs, or gallops,no edema  Respiratory:  clear to auscultation bilaterally, normal work of breathing GI: soft, nontender, nondistended, + BS MS: no deformity or atrophy  Skin: warm and dry Neuro:  Strength and sensation are intact Psych: euthymic mood, full affect  EKG:  EKG {ACTION; IS/IS [id number]} ordered today. Personal review of the ekg ordered *** shows ***   Recent Labs: 02/02/2022: BUN 20; Creatinine 1.3; Hemoglobin 12.8; Platelets 222; Potassium 3.7; Sodium 140 07/27/2022: ALT 21; TSH 7.880    Lipid Panel     Component Value Date/Time   CHOL 138 02/02/2022 0000   CHOL 139 10/28/2018 1014   TRIG 194 (A) 02/02/2022 0000   HDL 26 (A) 02/02/2022 0000   HDL 39 (L) 10/28/2018 1014   CHOLHDL 3.6 10/28/2018 1014   LDLCALC 73 02/02/2022 0000   LDLCALC 65 10/28/2018 1014     Wt Readings from Last 3 Encounters:  07/27/22 200 lb (90.7 kg)  02/17/22 205 lb 6.4 oz (93.2 kg)  02/01/22 205 lb 6.4 oz (93.2 kg)      Other studies Reviewed: Additional studies/ records that were reviewed today include: TTE 11/10/20  Review of the above records today demonstrates:   1. Left ventricular ejection fraction, by estimation, is 60 to 65%. The  left ventricle has normal function. The left ventricle  has no regional  wall motion abnormalities. There is moderate left ventricular hypertrophy.  Left ventricular diastolic  parameters are indeterminate.   2. Right ventricular systolic function is normal. The right ventricular  size is normal.   3. Left atrial size was mildly dilated.   4. The mitral valve is normal in structure. Trivial mitral valve  regurgitation. No evidence of mitral stenosis.   5. The aortic valve was not well visualized. Aortic valve regurgitation  is not visualized. No aortic stenosis is present.   6. The inferior vena cava is dilated in size with >50% respiratory  variability, suggesting right atrial pressure of 8 mmHg.    ASSESSMENT AND PLAN:  1.  Persistent atrial fibrillation: Currently on Eliquis and amiodarone.  CHA2DS2-VASc of  2.***  2.  Secondary hypercoagulable state: Currently on Eliquis for atrial fibrillation   Current medicines are reviewed at length with the patient today.   The patient does not have concerns regarding his medicines.  The following changes were made today: ***  Labs/ tests ordered today include:  No orders of the defined types were placed in this encounter.    Disposition:   FU *** months  Signed, Lavell Supple Jeffery Loa, MD  01/08/2023 1:47 PM     [facility] [address] [place] [address] [phone] (office) [phone] (fax)

## 2023-01-10 NOTE — Progress Notes (Unsigned)
Cardiology Office Note Date:  01/11/2023  Patient ID:  Jeffery Christian, Jeffery Christian 02-18-1951, MRN [id number] PCP:  [name], MD  Electrophysiologist: Dr. Elberta Fortis    Chief Complaint:  6 mo  History of Present Illness: Jeffery Christian is a 72 y.o. male with history of CKD (III), AFib, DCM, chronic CHF (systolic), hypotension, vascular dementia  hospitalized August 2022 after being found down by friends. Hospital course complicated by COVID-19 infection, acute on chronic kidney disease, hyperkalemia, metabolic encephalopathy   He saw Dr. Elberta Fortis 07/27/22, he did not think he had any AFib since amiodarone/DCCV.    NO changes were made   TODAY [facility] Chart reports hx of dementia, but his is AAO x3 He denies any CP, palpitations, cardiac concerns. Frustrated with inability to walk, though otherwise no concerns. No near syncope or syncope. No SOB  Paperwork from [facility] reports Muscle wasting and atrophy, NEC multiple sites Unspecified lack of coordination Generalized muscle weakness, unsteadiness   AAD hx Amiodarone started Feb 2022   Past Medical History:  Diagnosis Date   Chronic kidney disease    Chronic systolic CHF (congestive heart failure)    EF 35-40, diffuse HK, mild MR, moderate LAE, mild RAE, PASP 44, L pleural eff   Dilated cardiomyopathy    likely related to tachycardia   Dysrhythmia    Persistent atrial fibrillation     Past Surgical History:  Procedure Laterality Date   CARDIOVERSION N/A 08/20/2017   Procedure: CARDIOVERSION;  Surgeon: [name], MD;  [facility];  Service: Cardiovascular;  Laterality: N/A;   CARDIOVERSION N/A 11/18/2021   Procedure: CARDIOVERSION;  Surgeon: [name], MD;  [facility];  Service: Cardiovascular;  Laterality: N/A;   INCISION AND DRAINAGE ABSCESS Left 03/22/2014   Procedure: INCISION AND DRAINAGE ABSCESS LEFT BUTTOCK ABSCESS;  Surgeon: [name], MD;  [facility];  Service: General;  Laterality: Left;    Current Outpatient Medications  Medication Sig Dispense Refill   acetaminophen (TYLENOL) 325 MG tablet Take 650 mg by mouth every 6 (six) hours as needed.     allopurinol (ZYLOPRIM) 300 MG tablet Take 150 mg by mouth daily. (0800)     amiodarone (PACERONE) 200 MG tablet Take 200 mg by mouth daily. (0800)     Cholecalciferol (VITAMIN D) 50 MCG (2000 UT) tablet Take 2,000 Units by mouth daily. (0900)     colchicine 0.6 MG tablet Take by mouth as directed. Take 2 tablets (1.2 mg) by mouth at the first sign of a gout flare up and then take one tablet in one hour if symptoms continue     divalproex (DEPAKOTE SPRINKLE) 125 MG capsule Take 125 mg by mouth 3 (three) times daily. (0800, 1200 & 2000)     levothyroxine (SYNTHROID) 25 MCG tablet Take 25 mcg by mouth daily before breakfast.     midodrine (PROAMATINE) 5 MG tablet Take 5 mg by mouth 3 (three) times daily. (1000, 1400 & 2000)     Multiple Vitamins-Minerals (PRESERVISION AREDS PO) Take by mouth.     NON FORMULARY Diet: Regular/thin diet consistency     pantoprazole (PROTONIX) 40 MG tablet Take 1 tablet (40 mg total) by mouth daily.     polyethylene glycol (MIRALAX / GLYCOLAX) 17 g packet Take 17 g by mouth daily. (0800)     sertraline (ZOLOFT) 50 MG tablet Take 50 mg by mouth daily. At bedtime for sleep     tamsulosin (  FLOMAX) 0.4 MG CAPS capsule Take 1 capsule (0.4 mg total) by mouth daily after supper. 30 capsule    apixaban (ELIQUIS) 5 MG TABS tablet Take 1 tablet (5 mg total) by mouth 2 (two) times daily.     atorvastatin (LIPITOR) 20 MG tablet Take 1 tablet (20 mg total) by mouth every evening.     No current facility-administered medications for this visit.    Allergies:   Patient has no known allergies.   Social History:  The patient  reports that he has never smoked. He has never used smokeless tobacco. He reports that he does not currently use alcohol. He reports that he does not use  drugs.   Family History:  The patient's family history includes Cancer in his mother; Leukemia in his father.  ROS:  Please see the history of present illness.    All other systems are reviewed and otherwise negative.   PHYSICAL EXAM:  VS:  BP 124/70   Pulse (!) 53   Ht 6' (1.829 m)   SpO2 97%   BMI 27.12 kg/m  BMI: Body mass index is 27.12 kg/m. Well nourished, well developed, in no acute distress, chronically ill appearing HEENT: normocephalic, atraumatic Neck: no JVD, carotid bruits or masses Cardiac:  RRR; no significant murmurs, no rubs, or gallops Lungs:  CTA b/l, no wheezing, rhonchi or rales Abd: soft, nontender MS: no deformity or atrophy Ext: no edema Skin: warm and dry, no rash Neuro:  No gross deficits appreciated Psych: euthymic mood, full affect   EKG:  not done today 07/27/22: SB 54bpm   TTE 11/10/20   1. Left ventricular ejection fraction, by estimation, is 60 to 65%. The  left ventricle has normal function. The left ventricle has no regional  wall motion abnormalities. There is moderate left ventricular hypertrophy.  Left ventricular diastolic  parameters are indeterminate.   2. Right ventricular systolic function is normal. The right ventricular  size is normal.   3. Left atrial size was mildly dilated.   4. The mitral valve is normal in structure. Trivial mitral valve  regurgitation. No evidence of mitral stenosis.   5. The aortic valve was not well visualized. Aortic valve regurgitation  is not visualized. No aortic stenosis is present.   6. The inferior vena cava is dilated in size with >50% respiratory  variability, suggesting right atrial pressure of 8 mmHg.    [id number]: TTE Left ventricle: The cavity size was normal. Systolic function was    moderately reduced. The estimated ejection fraction was in the    range of 35% to 40%. Diffuse hypokinesis. The study was not    technically sufficient to allow evaluation of LV diastolic    dysfunction  due to atrial fibrillation.  - Mitral valve: There was mild regurgitation.  - Left atrium: The atrium was moderately dilated.  - Right ventricle: Systolic function was normal.  - Right atrium: The atrium was mildly dilated.  - Pulmonary arteries: Systolic pressure was moderately increased.    PA peak pressure: 44 mm Hg (S).  - Inferior vena cava: The vessel was dilated. The respirophasic    diameter changes were blunted (< 50%), consistent with elevated    central venous pressure.  - Pericardium, extracardiac: There was no pericardial effusion.    There was a left pleural effusion.    Recent Labs: 02/02/2022: BUN 20; Creatinine 1.3; Hemoglobin 12.8; Platelets 222; Potassium 3.7; Sodium 140 07/27/2022: ALT 21; TSH 7.880  02/02/2022: Cholesterol 138; HDL 26;  LDL Cholesterol 73; Triglycerides 194   CrCl cannot be calculated (Patient's most recent lab result is older than the maximum 21 days allowed.).   Wt Readings from Last 3 Encounters:  07/27/22 200 lb (90.7 kg)  02/17/22 205 lb 6.4 oz (93.2 kg)  02/01/22 205 lb 6.4 oz (93.2 kg)     Other studies reviewed: Additional studies/records reviewed today include: summarized above  ASSESSMENT AND PLAN:  Persistent AFib CHA2DS2Vasc is 2, on Eliquis, appropriately dosed Amiodarone chronically RRR on exam today Labs today  DCM Chronic CHF Recovered LVEF  by his echo 2022 Chronic hypotension, on midodrine, limits meds, management No symptoms or exam findings of volume OL  Secondary hypercoagulable state   Disposition: F/u with [place] again in 54mo, sooner if needed  Current medicines are reviewed at length with the patient today.  The patient did not have any concerns regarding medicines.  Norma Fredrickson, PA-C 01/11/2023 9:18 AM     [facility] [address] [place] [address] [phone] (office)  [phone] (fax)

## 2023-01-11 ENCOUNTER — Ambulatory Visit: Payer: Medicare Other | Attending: Cardiology | Admitting: Physician Assistant

## 2023-01-11 ENCOUNTER — Telehealth: Payer: Self-pay | Admitting: Physician Assistant

## 2023-01-11 ENCOUNTER — Encounter: Payer: Self-pay | Admitting: Physician Assistant

## 2023-01-11 VITALS — BP 124/70 | HR 53 | Ht 72.0 in

## 2023-01-11 DIAGNOSIS — I420 Dilated cardiomyopathy: Secondary | ICD-10-CM

## 2023-01-11 DIAGNOSIS — I4819 Other persistent atrial fibrillation: Secondary | ICD-10-CM | POA: Diagnosis not present

## 2023-01-11 DIAGNOSIS — D6869 Other thrombophilia: Secondary | ICD-10-CM

## 2023-01-11 DIAGNOSIS — Z79899 Other long term (current) drug therapy: Secondary | ICD-10-CM

## 2023-01-11 NOTE — Telephone Encounter (Signed)
Jeffery Christian with [facility] calling to request the patients lab results from today be sent to them. She says they received his AVS and it mentioned labs, but did not have them attached. Fax: [phone], Phone: [phone]

## 2023-01-11 NOTE — Patient Instructions (Signed)
Medication Instructions:    Your physician recommends that you continue on your current medications as directed. Please refer to the Current Medication list given to you today.  *If you need a refill on your cardiac medications before your next appointment, please call your pharmacy*   Lab Work:   CMET  CBC AND TSH TODAY   If you have labs (blood work) drawn today and your tests are completely normal, you will receive your results only by: MyChart Message (if you have MyChart) OR A paper copy in the mail If you have any lab test that is abnormal or we need to change your treatment, we will call you to review the results.   Testing/Procedures: NONE ORDERED  TODAY     Follow-Up: At [facility], you and your health needs are our priority.  As part of our continuing mission to provide you with exceptional heart care, we have created designated Provider Care Teams.  These Care Teams include your primary Cardiologist (physician) and Advanced Practice Providers (APPs -  Physician Assistants and Nurse Practitioners) who all work together to provide you with the care you need, when you need it.  We recommend signing up for the patient portal called "MyChart".  Sign up information is provided on this After Visit Summary.  MyChart is used to connect with patients for Virtual Visits (Telemedicine).  Patients are able to view lab/test results, encounter notes, upcoming appointments, etc.  Non-urgent messages can be sent to your provider as well.   To learn more about what you can do with MyChart, go to [email or web address].    Your next appointment:   6 month(s)  Provider:   Loman Brooklyn, MD or Francis Dowse, PA-C     Other Instructions

## 2023-01-12 LAB — COMPREHENSIVE METABOLIC PANEL
ALT: 22 IU/L (ref 0–44)
AST: 17 IU/L (ref 0–40)
Albumin/Globulin Ratio: 1.5 (ref 1.2–2.2)
Albumin: 3.7 g/dL — ABNORMAL LOW (ref 3.9–4.9)
Alkaline Phosphatase: 93 IU/L (ref 44–121)
BUN/Creatinine Ratio: 19 (ref 10–24)
BUN: 20 mg/dL (ref 8–27)
Bilirubin Total: 0.2 mg/dL (ref 0.0–1.2)
CO2: 23 mmol/L (ref 20–29)
Calcium: 9 mg/dL (ref 8.6–10.2)
Chloride: 105 mmol/L (ref 96–106)
Creatinine, Ser: 1.05 mg/dL (ref 0.76–1.27)
Globulin, Total: 2.4 g/dL (ref 1.5–4.5)
Glucose: 85 mg/dL (ref 70–99)
Potassium: 3.9 mmol/L (ref 3.5–5.2)
Sodium: 143 mmol/L (ref 134–144)
Total Protein: 6.1 g/dL (ref 6.0–8.5)
eGFR: 76 mL/min/{1.73_m2} (ref >59)

## 2023-01-12 LAB — CBC
Hematocrit: 39.9 % (ref 37.5–51.0)
Hemoglobin: 13.3 g/dL (ref 13.0–17.7)
MCH: 29 pg (ref 26.6–33.0)
MCHC: 33.3 g/dL (ref 31.5–35.7)
MCV: 87 fL (ref 79–97)
Platelets: 191 10*3/uL (ref 150–450)
RBC: 4.59 x10E6/uL (ref 4.14–5.80)
RDW: 14.4 % (ref 11.6–15.4)
WBC: 6.1 10*3/uL (ref 3.4–10.8)

## 2023-01-12 LAB — TSH: TSH: 10.6 u[IU]/mL — ABNORMAL HIGH (ref 0.450–4.500)

## 2023-01-12 NOTE — Telephone Encounter (Signed)
Sidharth, Sharum - 01/11/2023  2:11 PM Sheilah Pigeon, PA-C  Sent: Thu January 11, 2023  6:01 PM  To: Loa Socks, LPN; Oleta Mouse, CMA         Message  We drew the labs today, I don't have the results yet.  Edson Snowball, can you send them one we do.      Renee

## 2024-01-09 NOTE — Progress Notes (Unsigned)
 Cardiology Office Note Date:  01/09/2024  Patient ID:  Jeffery Christian, Jeffery Christian January 24, 1951, MRN [id number] PCP:  [name], MD  Electrophysiologist: Dr. Elberta Fortis    Chief Complaint:  overdue 6 mo  History of Present Illness: Jeffery Christian is a 73 y.o. male with history of  CKD (III),  AFib, DCM, chronic CHF (systolic), hypotension,  vascular dementia  hospitalized August 2022 after being found down by friends. Hospital course complicated by COVID-19 infection, acute on chronic kidney disease, hyperkalemia, metabolic encephalopathy   He saw Dr. Elberta Fortis 07/27/22, he did not think he had any AFib since amiodarone/DCCV.    NO changes were made   I saw him 01/11/23 He comes alone from [place] Chart reports hx of dementia, but his is AAO x3 He denies any CP, palpitations, cardiac concerns. Frustrated with inability to walk, though otherwise no concerns. No near syncope or syncope. No SOB Paperwork from [place] reports Muscle wasting and atrophy, NEC multiple sites Unspecified lack of coordination Generalized muscle weakness, unsteadiness No changes made  TODAY  He comes alone today (states there is staff outside waiting for him) Remains frustrated over not being able to walk, unknown diagnosis for this. He reports going to PT, though no improvement He remains at [facility]  Denies: CP, palpitations or any cardiac awareness No SOB, DOE, CP No near syncope or syncope No bleeding or signs of bleeding  Remains on amiodarone 200mg  daily  AAD hx Amiodarone started Feb 2022   Past Medical History:  Diagnosis Date   Chronic kidney disease    Chronic systolic CHF (congestive heart failure) (HCC)    EF 35-40, diffuse HK, mild MR, moderate LAE, mild RAE, PASP 44, L pleural eff   Dilated cardiomyopathy (HCC)    likely related to tachycardia   Dysrhythmia    Persistent atrial fibrillation [facility])     Past Surgical History:  Procedure Laterality Date   CARDIOVERSION N/A  08/20/2017   Procedure: CARDIOVERSION;  Surgeon: [name], MD;  [facility];  Service: Cardiovascular;  Laterality: N/A;   CARDIOVERSION N/A 11/18/2021   Procedure: CARDIOVERSION;  Surgeon: [name], MD;  [facility]: [facility];  Service: Cardiovascular;  Laterality: N/A;   INCISION AND DRAINAGE ABSCESS Left 03/22/2014   Procedure: INCISION AND DRAINAGE ABSCESS LEFT BUTTOCK ABSCESS;  Surgeon: [name], MD;  Location: [facility] OR;  Service: General;  Laterality: Left;    Current Outpatient Medications  Medication Sig Dispense Refill   acetaminophen (TYLENOL) 325 MG tablet Take 650 mg by mouth every 6 (six) hours as needed.     allopurinol (ZYLOPRIM) 300 MG tablet Take 150 mg by mouth daily. (0800)     amiodarone (PACERONE) 200 MG tablet Take 200 mg by mouth daily. (0800)     apixaban (ELIQUIS) 5 MG TABS tablet Take 1 tablet (5 mg total) by mouth 2 (two) times daily.     atorvastatin (LIPITOR) 20 MG tablet Take 1 tablet (20 mg total) by mouth every evening.     Cholecalciferol (VITAMIN D) 50 MCG (2000 UT) tablet Take 2,000 Units by mouth daily. (0900)     colchicine 0.6 MG tablet Take by mouth as directed. Take 2 tablets (1.2 mg) by mouth at the first sign of a gout flare up and then take one tablet in one hour if symptoms continue     divalproex (DEPAKOTE SPRINKLE) 125 MG capsule Take 125 mg by mouth 3 (three) times daily. (0800, 1200 & 2000)  levothyroxine (SYNTHROID) 25 MCG tablet Take 25 mcg by mouth daily before breakfast.     midodrine (PROAMATINE) 5 MG tablet Take 5 mg by mouth 3 (three) times daily. (1000, 1400 & 2000)     Multiple Vitamins-Minerals (PRESERVISION AREDS PO) Take by mouth.     NON FORMULARY Diet: Regular/thin diet consistency     pantoprazole (PROTONIX) 40 MG tablet Take 1 tablet (40 mg total) by mouth daily.     polyethylene glycol (MIRALAX / GLYCOLAX) 17 g packet Take 17 g by mouth daily. (0800)     sertraline (ZOLOFT) 50 MG tablet Take 50 mg  by mouth daily. At bedtime for sleep     tamsulosin (FLOMAX) 0.4 MG CAPS capsule Take 1 capsule (0.4 mg total) by mouth daily after supper. 30 capsule    No current facility-administered medications for this visit.    Allergies:   Patient has no known allergies.   Social History:  The patient  reports that he has never smoked. He has never used smokeless tobacco. He reports that he does not currently use alcohol. He reports that he does not use drugs.   Family History:  The patient's family history includes Cancer in his mother; Leukemia in his father.  ROS:  Please see the history of present illness.    All other systems are reviewed and otherwise negative.   PHYSICAL EXAM:  VS:  There were no vitals taken for this visit. BMI: There is no height or weight on file to calculate BMI. Well nourished, well developed, in no acute distress, chronically ill appearing HEENT: normocephalic, atraumatic Neck: no JVD, carotid bruits or masses Cardiac:  RRR; no significant murmurs, no rubs, or gallops Lungs: CTA b/l, no wheezing, rhonchi or rales Abd: soft, nontender MS: no deformity or atrophy Ext: trace-1+ edema b/l LE to mid shin Skin: warm and dry, no rash Neuro:  No gross deficits appreciated Psych: euthymic mood, full affect   EKG:  done today and reviewed by myself SR 65bpm, LAD, no significant change noted   TTE 11/10/20   1. Left ventricular ejection fraction, by estimation, is 60 to 65%. The  left ventricle has normal function. The left ventricle has no regional  wall motion abnormalities. There is moderate left ventricular hypertrophy.  Left ventricular diastolic  parameters are indeterminate.   2. Right ventricular systolic function is normal. The right ventricular  size is normal.   3. Left atrial size was mildly dilated.   4. The mitral valve is normal in structure. Trivial mitral valve  regurgitation. No evidence of mitral stenosis.   5. The aortic valve was not well  visualized. Aortic valve regurgitation  is not visualized. No aortic stenosis is present.   6. The inferior vena cava is dilated in size with >50% respiratory  variability, suggesting right atrial pressure of 8 mmHg.    [id number]: TTE Left ventricle: The cavity size was normal. Systolic function was    moderately reduced. The estimated ejection fraction was in the    range of 35% to 40%. Diffuse hypokinesis. The study was not    technically sufficient to allow evaluation of LV diastolic    dysfunction due to atrial fibrillation.  - Mitral valve: There was mild regurgitation.  - Left atrium: The atrium was moderately dilated.  - Right ventricle: Systolic function was normal.  - Right atrium: The atrium was mildly dilated.  - Pulmonary arteries: Systolic pressure was moderately increased.    PA peak pressure: 44 mm  Hg (S).  - Inferior vena cava: The vessel was dilated. The respirophasic    diameter changes were blunted (< 50%), consistent with elevated    central venous pressure.  - Pericardium, extracardiac: There was no pericardial effusion.    There was a left pleural effusion.    Recent Labs: 01/11/2023: ALT 22; BUN 20; Creatinine, Ser 1.05; Hemoglobin 13.3; Platelets 191; Potassium 3.9; Sodium 143; TSH 10.600  No results found for requested labs within last 365 days.   CrCl cannot be calculated (Patient's most recent lab result is older than the maximum 21 days allowed.).   Wt Readings from Last 3 Encounters:  07/27/22 200 lb (90.7 kg)  02/17/22 205 lb 6.4 oz (93.2 kg)  02/01/22 205 lb 6.4 oz (93.2 kg)     Other studies reviewed: Additional studies/records reviewed today include: summarized above  ASSESSMENT AND PLAN:  Persistent AFib CHA2DS2Vasc is 2, on Eliquis, appropriately dosed Amiodarone chronically >> reduce to 100mg  daily (1/2 of a 200mg  tab) Update labs today  DCM Chronic CHF Recovered LVEF  by his echo 2022 Off midodrine, BP looks OK, though not likely to  tolerate meds Given recovered LVEF, will not pursue further meds Some edema today, suspect more to do with dependent edema, lungs are very clear, no SOB Recommend support stockings, he reports he has some, "they don't put them on"  Secondary hypercoagulable state   Disposition: F/u with [place] again in 6, sooner if needed  Current medicines are reviewed at length with the patient today.  The patient did not have any concerns regarding medicines.  Norma Fredrickson, PA-C 01/09/2024 8:00 AM     [facility] [address] [place] [address] [phone] (office)  [phone] (fax)

## 2024-01-10 ENCOUNTER — Encounter: Payer: Self-pay | Admitting: Physician Assistant

## 2024-01-10 ENCOUNTER — Ambulatory Visit: Attending: Physician Assistant | Admitting: Physician Assistant

## 2024-01-10 VITALS — BP 118/76 | HR 65 | Ht 72.0 in | Wt 200.0 lb

## 2024-01-10 DIAGNOSIS — I4819 Other persistent atrial fibrillation: Secondary | ICD-10-CM | POA: Diagnosis present

## 2024-01-10 DIAGNOSIS — Z79899 Other long term (current) drug therapy: Secondary | ICD-10-CM

## 2024-01-10 DIAGNOSIS — I10 Essential (primary) hypertension: Secondary | ICD-10-CM | POA: Diagnosis present

## 2024-01-10 DIAGNOSIS — I420 Dilated cardiomyopathy: Secondary | ICD-10-CM

## 2024-01-10 DIAGNOSIS — D6869 Other thrombophilia: Secondary | ICD-10-CM | POA: Diagnosis present

## 2024-01-10 MED ORDER — AMIODARONE HCL 100 MG PO TABS: 100.0000 mg | ORAL_TABLET | Freq: Every day | ORAL | Status: AC

## 2024-01-10 NOTE — Patient Instructions (Addendum)
 Medication Instructions:   START TAKING: AMIODARONE 100 MG ONCE A DAY   *If you need a refill on your cardiac medications before your next appointment, please call your pharmacy*   Lab Work:  PLEASE GO DOWN STAIRS  [facility]  FIRST FLOOR  [address] ( GET OFF ELEVATORS MAKE A LEFT AND ANOTHER LEFT LAB ON RIGHT DOWN HALLWAY :  CMET  CBC AND TSH TODAY    If you have labs (blood work) drawn today and your tests are completely normal, you will receive your results only by: MyChart Message (if you have MyChart) OR A paper copy in the mail If you have any lab test that is abnormal or we need to change your treatment, we will call you to review the results.  Testing/Procedures: NONE ORDERED  TODAY    Follow-Up: At [facility], you and your health needs are our priority.  As part of our continuing mission to provide you with exceptional heart care, our providers are all part of one team.  This team includes your primary Cardiologist (physician) and Advanced Practice Providers or APPs (Physician Assistants and Nurse Practitioners) who all work together to provide you with the care you need, when you need it.  Your next appointment:   6 month(s)  Provider:   Loman Brooklyn, MD or Francis Dowse, PA-C    We recommend signing up for the patient portal called "MyChart".  Sign up information is provided on this After Visit Summary.  MyChart is used to connect with patients for Virtual Visits (Telemedicine).  Patients are able to view lab/test results, encounter notes, upcoming appointments, etc.  Non-urgent messages can be sent to your provider as well.   To learn more about what you can do with MyChart, go to [email or web address].   Other Instructions        1st Floor: - Lobby - Registration  - Pharmacy  - Lab - Cafe  2nd Floor: - PV Lab - Diagnostic Testing (echo, CT, nuclear med)  3rd Floor: - Vacant  4th Floor: - TCTS (cardiothoracic surgery) - AFib Clinic -  Structural Heart Clinic - Vascular Surgery  - Vascular Ultrasound  5th Floor: - HeartCare Cardiology (general and EP) - Clinical Pharmacy for coumadin, hypertension, lipid, weight-loss medications, and med management appointments    Valet parking services will be available as well.

## 2024-09-15 NOTE — Progress Notes (Unsigned)
 Cardiology Office Note Date:  09/15/2024  Patient ID:  Jeffery Christian, Jeffery Christian 1951-07-16, MRN [id number] PCP:  Jeffery Elsie HERO, MD  Electrophysiologist: Jeffery Christian    Chief Complaint:  *** 6 mo  History of Present Illness: Jeffery Christian is a 73 y.o. male with history of  CKD (III),  AFib, DCM, chronic CHF (systolic), hypotension,  vascular dementia  hospitalized August 2022 after being found down by friends. Hospital course complicated by COVID-19 infection, acute on chronic kidney disease, hyperkalemia, metabolic encephalopathy   He saw Jeffery Christian 07/27/22, he did not think he had any AFib since amiodarone /DCCV.    NO changes were made   I saw him 01/11/23 He comes alone from [place] Chart reports hx of dementia, but his is AAO x3 He denies any CP, palpitations, cardiac concerns. Frustrated with inability to walk, though otherwise no concerns. No near syncope or syncope. No SOB Paperwork from [place] reports Muscle wasting and atrophy, NEC multiple sites Unspecified lack of coordination Generalized muscle weakness, unsteadiness No changes made  I saw him 01/10/24: He comes alone today (states there is staff outside waiting for him) Remains frustrated over not being able to walk, unknown diagnosis for this. He reports going to PT, though no improvement He remains at [facility] Denies: CP, palpitations or any cardiac awareness No SOB, DOE, CP No near syncope or syncope No bleeding or signs of bleeding Remains on amiodarone  200mg  daily Amio reduced to 100mg  daily, labs Advised elevating his feet for some trace edema, support stockings  TODAY  *** eliquis , dose, bleeding, labs *** amio labs *** volume, recovered LVEF *** BP limits meds *** lives at [place]  AAD hx Amiodarone  started Feb 2022   Past Medical History:  Diagnosis Date   Chronic kidney disease    Chronic systolic CHF (congestive heart failure) (HCC)    EF 35-40, diffuse HK, mild MR, moderate  LAE, mild RAE, PASP 44, L pleural eff   Dilated cardiomyopathy (HCC)    likely related to tachycardia   Dysrhythmia    Persistent atrial fibrillation [facility])     Past Surgical History:  Procedure Laterality Date   CARDIOVERSION N/A 08/20/2017   Procedure: CARDIOVERSION;  Surgeon: Jeffrie Oneil BROCKS, MD;  [facility];  Service: Cardiovascular;  Laterality: N/A;   CARDIOVERSION N/A 11/18/2021   Procedure: CARDIOVERSION;  Surgeon: Jeffery Vinie BROCKS, MD;  [facility];  Service: Cardiovascular;  Laterality: N/A;   INCISION AND DRAINAGE ABSCESS Left 03/22/2014   Procedure: INCISION AND DRAINAGE ABSCESS LEFT BUTTOCK ABSCESS;  Surgeon: Jeffery FORBES Hummer, MD;  Location: [facility] OR;  Service: General;  Laterality: Left;    Current Outpatient Medications  Medication Sig Dispense Refill   acetaminophen  (TYLENOL ) 325 MG tablet Take 650 mg by mouth every 6 (six) hours as needed.     allopurinol  (ZYLOPRIM ) 300 MG tablet Take 150 mg by mouth daily. (0800)     amiodarone  (PACERONE ) 100 MG tablet Take 1 tablet (100 mg total) by mouth daily. (0800)     apixaban  (ELIQUIS ) 5 MG TABS tablet Take 1 tablet (5 mg total) by mouth 2 (two) times daily.     atorvastatin  (LIPITOR) 20 MG tablet Take 1 tablet (20 mg total) by mouth every evening.     cholecalciferol (VITAMIN D3) 25 MCG (1000 UNIT) tablet Take 1,000 Units by mouth 2 (two) times daily.     colchicine  0.6 MG tablet Take by mouth as directed. Take 2 tablets (1.2 mg) by mouth at  the first sign of a gout flare up and then take one tablet in one hour if symptoms continue     cyclobenzaprine (FLEXERIL) 5 MG tablet Take 5 mg by mouth 3 (three) times daily as needed.     divalproex  (DEPAKOTE  SPRINKLE) 125 MG capsule Take 125 mg by mouth 3 (three) times daily. (0800, 1200 & 2000)     gabapentin (NEURONTIN) 100 MG capsule Take 100 mg by mouth daily.     levothyroxine (SYNTHROID) 75 MCG tablet Take 75 mcg by mouth daily before breakfast.     Multiple  Vitamins-Minerals (HEALTHY EYES SUPERVISION 2 PO) Take by mouth. 1 tab 2 times per day     pantoprazole  (PROTONIX ) 40 MG tablet Take 1 tablet (40 mg total) by mouth daily.     polyethylene glycol (MIRALAX  / GLYCOLAX ) 17 g packet Take 17 g by mouth daily. (0800)     tamsulosin  (FLOMAX ) 0.4 MG CAPS capsule Take 1 capsule (0.4 mg total) by mouth daily after supper. 30 capsule    No current facility-administered medications for this visit.    Allergies:   Patient has no known allergies.   Social History:  The patient  reports that he has never smoked. He has never used smokeless tobacco. He reports that he does not currently use alcohol. He reports that he does not use drugs.   Family History:  The patient's family history includes Cancer in his mother; Leukemia in his father.  ROS:  Please see the history of present illness.    All other systems are reviewed and otherwise negative.   PHYSICAL EXAM:  VS:  There were no vitals taken for this visit. BMI: There is no height or weight on file to calculate BMI. Well nourished, well developed, in no acute distress, chronically ill appearing HEENT: normocephalic, atraumatic Neck: no JVD, carotid bruits or masses Cardiac:  *** RRR; no significant murmurs, no rubs, or gallops Lungs: *** CTA b/l, no wheezing, rhonchi or rales Abd: soft, nontender MS: no deformity or atrophy Ext: *** trace-1+ edema b/l LE to mid shin Skin: warm and dry, no rash Neuro:  No gross deficits appreciated Psych: euthymic mood, full affect   EKG:  not done today  TTE 11/10/20   1. Left ventricular ejection fraction, by estimation, is 60 to 65%. The  left ventricle has normal function. The left ventricle has no regional  wall motion abnormalities. There is moderate left ventricular hypertrophy.  Left ventricular diastolic  parameters are indeterminate.   2. Right ventricular systolic function is normal. The right ventricular  size is normal.   3. Left atrial size was  mildly dilated.   4. The mitral valve is normal in structure. Trivial mitral valve  regurgitation. No evidence of mitral stenosis.   5. The aortic valve was not well visualized. Aortic valve regurgitation  is not visualized. No aortic stenosis is present.   6. The inferior vena cava is dilated in size with >50% respiratory  variability, suggesting right atrial pressure of 8 mmHg.    [id number]: TTE Left ventricle: The cavity size was normal. Systolic function was    moderately reduced. The estimated ejection fraction was in the    range of 35% to 40%. Diffuse hypokinesis. The study was not    technically sufficient to allow evaluation of LV diastolic    dysfunction due to atrial fibrillation.  - Mitral valve: There was mild regurgitation.  - Left atrium: The atrium was moderately dilated.  - Right ventricle:  Systolic function was normal.  - Right atrium: The atrium was mildly dilated.  - Pulmonary arteries: Systolic pressure was moderately increased.    PA peak pressure: 44 mm Hg (S).  - Inferior vena cava: The vessel was dilated. The respirophasic    diameter changes were blunted (< 50%), consistent with elevated    central venous pressure.  - Pericardium, extracardiac: There was no pericardial effusion.    There was a left pleural effusion.    Recent Labs: No results found for requested labs within last 365 days.  No results found for requested labs within last 365 days.   CrCl cannot be calculated (Patient's most recent lab result is older than the maximum 21 days allowed.).   Wt Readings from Last 3 Encounters:  01/10/24 200 lb (90.7 kg)  07/27/22 200 lb (90.7 kg)  02/17/22 205 lb 6.4 oz (93.2 kg)     Other studies reviewed: Additional studies/records reviewed today include: summarized above  ASSESSMENT AND PLAN:  Persistent AFib CHA2DS2Vasc is 2, on Eliquis , *** appropriately dosed Amiodarone  chronically  *** burden by symptoms ***   DCM Chronic CHF Recovered  LVEF  by his echo 2022 Off midodrine , BP looks OK, though not likely to tolerate meds Given recovered LVEF, will not pursue further meds *** Some edema today, suspect more to do with dependent edema, lungs are very clear, no SOB *** Recommend support stockings, he reports he has some, they don't put them on  Secondary hypercoagulable state   Disposition: F/u with us  again in *** , sooner if needed  Current medicines are reviewed at length with the patient today.  The patient did not have any concerns regarding medicines.  Bonney Charlies Arthur, PA-C 09/15/2024 9:05 AM     [facility] [address] [place] [address] [phone] (office)  [phone] (fax)

## 2024-09-17 ENCOUNTER — Ambulatory Visit: Attending: Physician Assistant | Admitting: Physician Assistant

## 2024-09-17 VITALS — BP 130/84 | HR 92 | Ht 72.0 in

## 2024-09-17 DIAGNOSIS — I428 Other cardiomyopathies: Secondary | ICD-10-CM | POA: Insufficient documentation

## 2024-09-17 DIAGNOSIS — I4819 Other persistent atrial fibrillation: Secondary | ICD-10-CM | POA: Insufficient documentation

## 2024-09-17 DIAGNOSIS — D6869 Other thrombophilia: Secondary | ICD-10-CM | POA: Insufficient documentation

## 2024-09-17 NOTE — Addendum Note (Signed)
 Addended by: RUBBIE KERRI HERO on: 09/17/2024 10:05 AM   Modules accepted: Orders

## 2024-09-17 NOTE — Patient Instructions (Signed)
 Medication Instructions:    Your physician recommends that you continue on your current medications as directed. Please refer to the Current Medication list given to you today.  .[email or web address]*If you need a refill on your cardiac medications before your next appointment, please call your pharmacy*   Lab Work:  PLEASE GO DOWN STAIRS  [facility]  FIRST FLOOR   ( GET OFF ELEVATORS WALK TOWARDS WAITING AREA LAB LOCATED BY PHARMACY):  CMET CBC  AND THYROID  PANEL      If you have labs (blood work) drawn today and your tests are completely normal, you will receive your results only by: MyChart Message (if you have MyChart) OR A paper copy in the mail If you have any lab test that is abnormal or we need to change your treatment, we will call you to review the results.   Testing/Procedures: NONE ORDERED  TODAY     Follow-Up: At [facility], you and your health needs are our priority.  As part of our continuing mission to provide you with exceptional heart care, our providers are all part of one team.  This team includes your primary Cardiologist (physician) and Advanced Practice Providers or APPs (Physician Assistants and Nurse Practitioners) who all work together to provide you with the care you need, when you need it.  Your next appointment:    6 month(s)  Provider:    You may see Will Gladis Norton, MD or one of the following Advanced Practice Providers on your designated Care Team:   Charlies Arthur, [place]   We recommend signing up for the patient portal called MyChart.  Sign up information is provided on this After Visit Summary.  MyChart is used to connect with patients for Virtual Visits (Telemedicine).  Patients are able to view lab/test results, encounter notes, upcoming appointments, etc.  Non-urgent messages can be sent to your provider as well.   To learn more about what you can do with MyChart, go to [email or web address].   Other Instructions

## 2024-09-18 ENCOUNTER — Ambulatory Visit: Payer: Self-pay | Admitting: Physician Assistant

## 2024-09-18 LAB — COMPREHENSIVE METABOLIC PANEL WITH GFR
ALT: 22 IU/L (ref 0–44)
AST: 18 IU/L (ref 0–40)
Albumin: 4 g/dL (ref 3.8–4.8)
Alkaline Phosphatase: 62 IU/L (ref 47–123)
BUN/Creatinine Ratio: 24 (ref 10–24)
BUN: 30 mg/dL — ABNORMAL HIGH (ref 8–27)
Bilirubin Total: 0.2 mg/dL (ref 0.0–1.2)
CO2: 27 mmol/L (ref 20–29)
Calcium: 9.4 mg/dL (ref 8.6–10.2)
Chloride: 101 mmol/L (ref 96–106)
Creatinine, Ser: 1.24 mg/dL (ref 0.76–1.27)
Globulin, Total: 2.2 g/dL (ref 1.5–4.5)
Glucose: 89 mg/dL (ref 70–99)
Potassium: 4.4 mmol/L (ref 3.5–5.2)
Sodium: 146 mmol/L — ABNORMAL HIGH (ref 134–144)
Total Protein: 6.2 g/dL (ref 6.0–8.5)
eGFR: 62 mL/min/1.73 (ref >59)

## 2024-09-18 LAB — CBC
Hematocrit: 46.2 % (ref 37.5–51.0)
Hemoglobin: 15.3 g/dL (ref 13.0–17.7)
MCH: 31.5 pg (ref 26.6–33.0)
MCHC: 33.1 g/dL (ref 31.5–35.7)
MCV: 95 fL (ref 79–97)
Platelets: 150 x10E3/uL (ref 150–450)
RBC: 4.85 x10E6/uL (ref 4.14–5.80)
RDW: 13.1 % (ref 11.6–15.4)
WBC: 8.4 x10E3/uL (ref 3.4–10.8)

## 2024-09-18 LAB — THYROID PANEL WITH TSH
Free Thyroxine Index: 2.3 (ref 1.2–4.9)
T3 Uptake Ratio: 32 % (ref 24–39)
T4, Total: 7.2 ug/dL (ref 4.5–12.0)
TSH: 2.49 u[IU]/mL (ref 0.450–4.500)

## 2024-09-18 NOTE — Telephone Encounter (Signed)
-----   Message from Charlies Macario Arthur sent at 09/18/2024  1:32 PM EST ----- Stable labs, no changes needed.

## 2024-09-18 NOTE — Telephone Encounter (Signed)
Lvm of results and clinic number if any questions
